# Patient Record
Sex: Female | Born: 1935 | Race: Black or African American | Hispanic: No | Smoking: Former smoker
Health system: Southern US, Community
[De-identification: ages and names within clinical notes are randomized; demographics above are authoritative.]

## PROBLEM LIST (undated history)

## (undated) DIAGNOSIS — F5104 Psychophysiologic insomnia: Secondary | ICD-10-CM

## (undated) DIAGNOSIS — R440 Auditory hallucinations: Secondary | ICD-10-CM

## (undated) DIAGNOSIS — K2270 Barrett's esophagus without dysplasia: Secondary | ICD-10-CM

## (undated) DIAGNOSIS — R911 Solitary pulmonary nodule: Secondary | ICD-10-CM

## (undated) DIAGNOSIS — K5790 Diverticulosis of intestine, part unspecified, without perforation or abscess without bleeding: Secondary | ICD-10-CM

## (undated) DIAGNOSIS — I350 Nonrheumatic aortic (valve) stenosis: Secondary | ICD-10-CM

## (undated) DIAGNOSIS — I82402 Acute embolism and thrombosis of unspecified deep veins of left lower extremity: Secondary | ICD-10-CM

## (undated) DIAGNOSIS — E785 Hyperlipidemia, unspecified: Secondary | ICD-10-CM

## (undated) DIAGNOSIS — K8020 Calculus of gallbladder without cholecystitis without obstruction: Secondary | ICD-10-CM

## (undated) DIAGNOSIS — I10 Essential (primary) hypertension: Secondary | ICD-10-CM

## (undated) DIAGNOSIS — I82409 Acute embolism and thrombosis of unspecified deep veins of unspecified lower extremity: Secondary | ICD-10-CM

## (undated) DIAGNOSIS — K3184 Gastroparesis: Secondary | ICD-10-CM

## (undated) DIAGNOSIS — G4700 Insomnia, unspecified: Secondary | ICD-10-CM

## (undated) DIAGNOSIS — Z952 Presence of prosthetic heart valve: Secondary | ICD-10-CM

## (undated) DIAGNOSIS — Z8719 Personal history of other diseases of the digestive system: Secondary | ICD-10-CM

## (undated) DIAGNOSIS — F32A Depression, unspecified: Secondary | ICD-10-CM

## (undated) DIAGNOSIS — D649 Anemia, unspecified: Secondary | ICD-10-CM

## (undated) DIAGNOSIS — F419 Anxiety disorder, unspecified: Secondary | ICD-10-CM

## (undated) DIAGNOSIS — R270 Ataxia, unspecified: Secondary | ICD-10-CM

## (undated) DIAGNOSIS — K219 Gastro-esophageal reflux disease without esophagitis: Secondary | ICD-10-CM

## (undated) DIAGNOSIS — F329 Major depressive disorder, single episode, unspecified: Secondary | ICD-10-CM

## (undated) DIAGNOSIS — K449 Diaphragmatic hernia without obstruction or gangrene: Secondary | ICD-10-CM

## (undated) HISTORY — DX: Gastroparesis: K31.84

## (undated) HISTORY — DX: Essential (primary) hypertension: I10

## (undated) HISTORY — DX: Auditory hallucinations: R44.0

## (undated) HISTORY — PX: EYE SURGERY: SHX253

## (undated) HISTORY — DX: Depression, unspecified: F32.A

## (undated) HISTORY — PX: CATARACT EXTRACTION: SUR2

## (undated) HISTORY — DX: Nonrheumatic aortic (valve) stenosis: I35.0

## (undated) HISTORY — DX: Hyperlipidemia, unspecified: E78.5

## (undated) HISTORY — PX: COLONOSCOPY: SHX174

## (undated) HISTORY — DX: Ataxia, unspecified: R27.0

## (undated) HISTORY — PX: APPENDECTOMY: SHX54

## (undated) HISTORY — DX: Psychophysiologic insomnia: F51.04

## (undated) HISTORY — PX: CARDIAC CATHETERIZATION: SHX172

## (undated) HISTORY — DX: Acute embolism and thrombosis of unspecified deep veins of unspecified lower extremity: I82.409

## (undated) HISTORY — PX: KNEE ARTHROSCOPY: SUR90

## (undated) HISTORY — DX: Calculus of gallbladder without cholecystitis without obstruction: K80.20

## (undated) HISTORY — PX: HEMORRHOID SURGERY: SHX153

## (undated) HISTORY — DX: Diaphragmatic hernia without obstruction or gangrene: K44.9

## (undated) HISTORY — DX: Gastro-esophageal reflux disease without esophagitis: K21.9

## (undated) HISTORY — PX: ABDOMINAL HYSTERECTOMY: SHX81

## (undated) HISTORY — DX: Major depressive disorder, single episode, unspecified: F32.9

## (undated) HISTORY — DX: Anxiety disorder, unspecified: F41.9

## (undated) HISTORY — DX: Diverticulosis of intestine, part unspecified, without perforation or abscess without bleeding: K57.90

---
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
## 1997-09-30 ENCOUNTER — Encounter: Admission: RE | Admit: 1997-09-30 | Discharge: 1997-09-30 | Payer: Self-pay | Admitting: Psychiatry

## 1998-04-26 HISTORY — PX: POLYPECTOMY: SHX149

## 1998-08-05 ENCOUNTER — Encounter: Admission: RE | Admit: 1998-08-05 | Discharge: 1998-08-05 | Payer: Self-pay | Admitting: Internal Medicine

## 1998-08-26 ENCOUNTER — Encounter: Admission: RE | Admit: 1998-08-26 | Discharge: 1998-08-26 | Payer: Self-pay | Admitting: Internal Medicine

## 1998-10-01 ENCOUNTER — Encounter: Admission: RE | Admit: 1998-10-01 | Discharge: 1998-10-01 | Payer: Self-pay | Admitting: Internal Medicine

## 1998-12-17 ENCOUNTER — Encounter: Admission: RE | Admit: 1998-12-17 | Discharge: 1998-12-17 | Payer: Self-pay | Admitting: Internal Medicine

## 1998-12-17 ENCOUNTER — Encounter: Payer: Self-pay | Admitting: Internal Medicine

## 1998-12-17 ENCOUNTER — Ambulatory Visit (HOSPITAL_COMMUNITY): Admission: RE | Admit: 1998-12-17 | Discharge: 1998-12-17 | Payer: Self-pay | Admitting: Internal Medicine

## 1998-12-30 ENCOUNTER — Encounter: Admission: RE | Admit: 1998-12-30 | Discharge: 1998-12-30 | Payer: Self-pay | Admitting: Internal Medicine

## 1998-12-30 ENCOUNTER — Ambulatory Visit (HOSPITAL_COMMUNITY): Admission: RE | Admit: 1998-12-30 | Discharge: 1998-12-30 | Payer: Self-pay | Admitting: Internal Medicine

## 1999-01-05 ENCOUNTER — Encounter: Admission: RE | Admit: 1999-01-05 | Discharge: 1999-01-05 | Payer: Self-pay | Admitting: Internal Medicine

## 1999-01-20 ENCOUNTER — Ambulatory Visit (HOSPITAL_COMMUNITY): Admission: RE | Admit: 1999-01-20 | Discharge: 1999-01-20 | Payer: Self-pay | Admitting: Internal Medicine

## 1999-01-20 ENCOUNTER — Encounter: Admission: RE | Admit: 1999-01-20 | Discharge: 1999-01-20 | Payer: Self-pay | Admitting: Internal Medicine

## 1999-01-22 ENCOUNTER — Ambulatory Visit (HOSPITAL_COMMUNITY): Admission: RE | Admit: 1999-01-22 | Discharge: 1999-01-22 | Payer: Self-pay

## 1999-01-22 ENCOUNTER — Encounter: Payer: Self-pay | Admitting: Internal Medicine

## 1999-02-11 ENCOUNTER — Encounter: Admission: RE | Admit: 1999-02-11 | Discharge: 1999-02-11 | Payer: Self-pay | Admitting: Internal Medicine

## 1999-03-04 ENCOUNTER — Encounter (INDEPENDENT_AMBULATORY_CARE_PROVIDER_SITE_OTHER): Payer: Self-pay | Admitting: *Deleted

## 1999-03-04 ENCOUNTER — Ambulatory Visit (HOSPITAL_COMMUNITY): Admission: RE | Admit: 1999-03-04 | Discharge: 1999-03-04 | Payer: Self-pay | Admitting: Gastroenterology

## 1999-06-10 ENCOUNTER — Encounter: Admission: RE | Admit: 1999-06-10 | Discharge: 1999-06-10 | Payer: Self-pay | Admitting: Internal Medicine

## 1999-08-06 ENCOUNTER — Encounter: Admission: RE | Admit: 1999-08-06 | Discharge: 1999-08-06 | Payer: Self-pay | Admitting: Internal Medicine

## 1999-12-15 ENCOUNTER — Emergency Department (HOSPITAL_COMMUNITY): Admission: EM | Admit: 1999-12-15 | Discharge: 1999-12-15 | Payer: Self-pay | Admitting: Emergency Medicine

## 1999-12-15 ENCOUNTER — Encounter: Payer: Self-pay | Admitting: Emergency Medicine

## 1999-12-17 ENCOUNTER — Encounter: Admission: RE | Admit: 1999-12-17 | Discharge: 1999-12-17 | Payer: Self-pay | Admitting: Internal Medicine

## 2000-02-24 ENCOUNTER — Encounter: Admission: RE | Admit: 2000-02-24 | Discharge: 2000-02-24 | Payer: Self-pay | Admitting: Internal Medicine

## 2000-03-14 ENCOUNTER — Encounter: Admission: RE | Admit: 2000-03-14 | Discharge: 2000-03-14 | Payer: Self-pay | Admitting: Internal Medicine

## 2000-05-30 ENCOUNTER — Encounter: Admission: RE | Admit: 2000-05-30 | Discharge: 2000-05-30 | Payer: Self-pay | Admitting: Internal Medicine

## 2000-06-13 ENCOUNTER — Encounter: Admission: RE | Admit: 2000-06-13 | Discharge: 2000-06-13 | Payer: Self-pay | Admitting: Internal Medicine

## 2000-08-24 ENCOUNTER — Encounter: Admission: RE | Admit: 2000-08-24 | Discharge: 2000-08-24 | Payer: Self-pay | Admitting: Hematology and Oncology

## 2000-09-27 ENCOUNTER — Encounter: Payer: Self-pay | Admitting: Emergency Medicine

## 2000-09-27 ENCOUNTER — Emergency Department (HOSPITAL_COMMUNITY): Admission: EM | Admit: 2000-09-27 | Discharge: 2000-09-27 | Payer: Self-pay | Admitting: Emergency Medicine

## 2000-10-24 HISTORY — PX: MENISCECTOMY: SHX123

## 2000-10-26 ENCOUNTER — Encounter: Admission: RE | Admit: 2000-10-26 | Discharge: 2000-10-26 | Payer: Self-pay | Admitting: Internal Medicine

## 2000-11-10 ENCOUNTER — Encounter: Admission: RE | Admit: 2000-11-10 | Discharge: 2000-11-10 | Payer: Self-pay | Admitting: Internal Medicine

## 2000-11-18 ENCOUNTER — Emergency Department (HOSPITAL_COMMUNITY): Admission: EM | Admit: 2000-11-18 | Discharge: 2000-11-18 | Payer: Self-pay | Admitting: Emergency Medicine

## 2000-11-21 ENCOUNTER — Encounter: Payer: Self-pay | Admitting: Orthopedic Surgery

## 2000-11-21 ENCOUNTER — Encounter: Admission: RE | Admit: 2000-11-21 | Discharge: 2000-11-21 | Payer: Self-pay | Admitting: Orthopedic Surgery

## 2000-11-23 ENCOUNTER — Ambulatory Visit (HOSPITAL_BASED_OUTPATIENT_CLINIC_OR_DEPARTMENT_OTHER): Admission: RE | Admit: 2000-11-23 | Discharge: 2000-11-24 | Payer: Self-pay | Admitting: Orthopedic Surgery

## 2000-11-25 ENCOUNTER — Emergency Department (HOSPITAL_COMMUNITY): Admission: EM | Admit: 2000-11-25 | Discharge: 2000-11-25 | Payer: Self-pay | Admitting: Emergency Medicine

## 2001-07-17 ENCOUNTER — Encounter: Admission: RE | Admit: 2001-07-17 | Discharge: 2001-07-17 | Payer: Self-pay | Admitting: Internal Medicine

## 2001-10-15 ENCOUNTER — Emergency Department (HOSPITAL_COMMUNITY): Admission: EM | Admit: 2001-10-15 | Discharge: 2001-10-15 | Payer: Self-pay | Admitting: Emergency Medicine

## 2001-11-09 ENCOUNTER — Encounter: Admission: RE | Admit: 2001-11-09 | Discharge: 2001-11-09 | Payer: Self-pay | Admitting: Internal Medicine

## 2002-05-29 ENCOUNTER — Encounter: Admission: RE | Admit: 2002-05-29 | Discharge: 2002-05-29 | Payer: Self-pay | Admitting: Internal Medicine

## 2002-06-05 ENCOUNTER — Encounter: Admission: RE | Admit: 2002-06-05 | Discharge: 2002-06-05 | Payer: Self-pay | Admitting: Internal Medicine

## 2002-08-13 ENCOUNTER — Encounter: Admission: RE | Admit: 2002-08-13 | Discharge: 2002-08-13 | Payer: Self-pay | Admitting: Internal Medicine

## 2002-08-16 ENCOUNTER — Ambulatory Visit (HOSPITAL_COMMUNITY): Admission: RE | Admit: 2002-08-16 | Discharge: 2002-08-16 | Payer: Self-pay | Admitting: Internal Medicine

## 2002-08-17 ENCOUNTER — Encounter: Admission: RE | Admit: 2002-08-17 | Discharge: 2002-08-17 | Payer: Self-pay | Admitting: Internal Medicine

## 2002-08-17 ENCOUNTER — Ambulatory Visit (HOSPITAL_COMMUNITY): Admission: RE | Admit: 2002-08-17 | Discharge: 2002-08-17 | Payer: Self-pay | Admitting: Internal Medicine

## 2002-11-21 ENCOUNTER — Encounter: Admission: RE | Admit: 2002-11-21 | Discharge: 2002-11-21 | Payer: Self-pay | Admitting: Internal Medicine

## 2003-08-19 ENCOUNTER — Ambulatory Visit (HOSPITAL_COMMUNITY): Admission: RE | Admit: 2003-08-19 | Discharge: 2003-08-19 | Payer: Self-pay | Admitting: Internal Medicine

## 2003-09-09 ENCOUNTER — Ambulatory Visit (HOSPITAL_COMMUNITY): Admission: RE | Admit: 2003-09-09 | Discharge: 2003-09-09 | Payer: Self-pay | Admitting: Gastroenterology

## 2003-09-30 ENCOUNTER — Encounter: Admission: RE | Admit: 2003-09-30 | Discharge: 2003-09-30 | Payer: Self-pay | Admitting: Internal Medicine

## 2004-02-21 ENCOUNTER — Emergency Department (HOSPITAL_COMMUNITY): Admission: EM | Admit: 2004-02-21 | Discharge: 2004-02-21 | Payer: Self-pay | Admitting: Emergency Medicine

## 2004-02-23 ENCOUNTER — Emergency Department (HOSPITAL_COMMUNITY): Admission: EM | Admit: 2004-02-23 | Discharge: 2004-02-23 | Payer: Self-pay | Admitting: *Deleted

## 2004-02-25 ENCOUNTER — Emergency Department (HOSPITAL_COMMUNITY): Admission: EM | Admit: 2004-02-25 | Discharge: 2004-02-26 | Payer: Self-pay | Admitting: Emergency Medicine

## 2004-04-27 ENCOUNTER — Emergency Department (HOSPITAL_COMMUNITY): Admission: EM | Admit: 2004-04-27 | Discharge: 2004-04-27 | Payer: Self-pay | Admitting: Emergency Medicine

## 2004-04-30 ENCOUNTER — Emergency Department (HOSPITAL_COMMUNITY): Admission: EM | Admit: 2004-04-30 | Discharge: 2004-04-30 | Payer: Self-pay | Admitting: Family Medicine

## 2004-05-07 ENCOUNTER — Ambulatory Visit: Payer: Self-pay | Admitting: Internal Medicine

## 2004-06-17 ENCOUNTER — Ambulatory Visit: Payer: Self-pay | Admitting: Internal Medicine

## 2004-08-31 ENCOUNTER — Ambulatory Visit: Payer: Self-pay | Admitting: Internal Medicine

## 2004-09-09 ENCOUNTER — Ambulatory Visit (HOSPITAL_COMMUNITY): Admission: RE | Admit: 2004-09-09 | Discharge: 2004-09-09 | Payer: Self-pay | Admitting: Internal Medicine

## 2004-09-09 ENCOUNTER — Encounter (INDEPENDENT_AMBULATORY_CARE_PROVIDER_SITE_OTHER): Payer: Self-pay | Admitting: Internal Medicine

## 2005-10-08 ENCOUNTER — Encounter (INDEPENDENT_AMBULATORY_CARE_PROVIDER_SITE_OTHER): Payer: Self-pay | Admitting: *Deleted

## 2005-10-08 ENCOUNTER — Emergency Department (HOSPITAL_COMMUNITY): Admission: EM | Admit: 2005-10-08 | Discharge: 2005-10-08 | Payer: Self-pay | Admitting: Family Medicine

## 2005-10-08 ENCOUNTER — Ambulatory Visit (HOSPITAL_COMMUNITY): Admission: RE | Admit: 2005-10-08 | Discharge: 2005-10-08 | Payer: Self-pay | Admitting: Family Medicine

## 2006-06-29 ENCOUNTER — Telehealth (INDEPENDENT_AMBULATORY_CARE_PROVIDER_SITE_OTHER): Payer: Self-pay | Admitting: Internal Medicine

## 2006-07-06 ENCOUNTER — Ambulatory Visit: Payer: Self-pay | Admitting: Internal Medicine

## 2006-07-06 DIAGNOSIS — I872 Venous insufficiency (chronic) (peripheral): Secondary | ICD-10-CM | POA: Insufficient documentation

## 2006-07-11 ENCOUNTER — Telehealth (INDEPENDENT_AMBULATORY_CARE_PROVIDER_SITE_OTHER): Payer: Self-pay | Admitting: Internal Medicine

## 2006-07-12 ENCOUNTER — Telehealth: Payer: Self-pay | Admitting: *Deleted

## 2006-07-27 ENCOUNTER — Ambulatory Visit: Payer: Self-pay | Admitting: Internal Medicine

## 2006-07-27 DIAGNOSIS — G4700 Insomnia, unspecified: Secondary | ICD-10-CM

## 2006-07-27 HISTORY — DX: Insomnia, unspecified: G47.00

## 2006-11-07 ENCOUNTER — Telehealth (INDEPENDENT_AMBULATORY_CARE_PROVIDER_SITE_OTHER): Payer: Self-pay | Admitting: Internal Medicine

## 2006-11-30 ENCOUNTER — Ambulatory Visit: Payer: Self-pay | Admitting: Internal Medicine

## 2006-12-05 ENCOUNTER — Ambulatory Visit (HOSPITAL_COMMUNITY): Admission: RE | Admit: 2006-12-05 | Discharge: 2006-12-05 | Payer: Self-pay | Admitting: Family Medicine

## 2006-12-10 ENCOUNTER — Ambulatory Visit: Payer: Self-pay | Admitting: Hospitalist

## 2006-12-10 ENCOUNTER — Inpatient Hospital Stay (HOSPITAL_COMMUNITY): Admission: EM | Admit: 2006-12-10 | Discharge: 2006-12-21 | Payer: Self-pay | Admitting: Emergency Medicine

## 2006-12-13 ENCOUNTER — Encounter (INDEPENDENT_AMBULATORY_CARE_PROVIDER_SITE_OTHER): Payer: Self-pay | Admitting: Hospitalist

## 2006-12-26 HISTORY — PX: DIRECT LARYNGOSCOPY: SHX5326

## 2006-12-28 ENCOUNTER — Telehealth: Payer: Self-pay | Admitting: *Deleted

## 2006-12-29 ENCOUNTER — Encounter (INDEPENDENT_AMBULATORY_CARE_PROVIDER_SITE_OTHER): Payer: Self-pay | Admitting: Otolaryngology

## 2006-12-29 ENCOUNTER — Ambulatory Visit (HOSPITAL_BASED_OUTPATIENT_CLINIC_OR_DEPARTMENT_OTHER): Admission: RE | Admit: 2006-12-29 | Discharge: 2006-12-29 | Payer: Self-pay | Admitting: Otolaryngology

## 2007-01-04 ENCOUNTER — Ambulatory Visit: Payer: Self-pay | Admitting: Internal Medicine

## 2007-01-04 ENCOUNTER — Encounter (INDEPENDENT_AMBULATORY_CARE_PROVIDER_SITE_OTHER): Payer: Self-pay | Admitting: *Deleted

## 2007-01-04 DIAGNOSIS — D649 Anemia, unspecified: Secondary | ICD-10-CM

## 2007-01-04 LAB — CONVERTED CEMR LAB
Basophils Absolute: 0 10*3/uL (ref 0.0–0.1)
Hemoglobin: 12.8 g/dL (ref 12.0–15.0)
Lymphocytes Relative: 35 % (ref 12–46)
Lymphs Abs: 1.6 10*3/uL (ref 0.7–3.3)
Monocytes Absolute: 0.4 10*3/uL (ref 0.2–0.7)
Monocytes Relative: 8 % (ref 3–11)
Neutro Abs: 2.4 10*3/uL (ref 1.7–7.7)
RBC: 4.49 M/uL (ref 3.87–5.11)
RDW: 14.2 % — ABNORMAL HIGH (ref 11.5–14.0)
WBC: 4.5 10*3/uL (ref 4.0–10.5)

## 2007-01-06 ENCOUNTER — Encounter (INDEPENDENT_AMBULATORY_CARE_PROVIDER_SITE_OTHER): Payer: Self-pay | Admitting: Internal Medicine

## 2007-01-25 ENCOUNTER — Ambulatory Visit: Payer: Self-pay | Admitting: Internal Medicine

## 2007-02-25 ENCOUNTER — Telehealth (INDEPENDENT_AMBULATORY_CARE_PROVIDER_SITE_OTHER): Payer: Self-pay | Admitting: *Deleted

## 2007-03-08 ENCOUNTER — Telehealth (INDEPENDENT_AMBULATORY_CARE_PROVIDER_SITE_OTHER): Payer: Self-pay | Admitting: Internal Medicine

## 2007-06-12 ENCOUNTER — Telehealth: Payer: Self-pay | Admitting: *Deleted

## 2007-06-20 ENCOUNTER — Telehealth: Payer: Self-pay | Admitting: *Deleted

## 2007-07-06 ENCOUNTER — Ambulatory Visit: Payer: Self-pay | Admitting: Internal Medicine

## 2007-08-25 ENCOUNTER — Telehealth: Payer: Self-pay | Admitting: *Deleted

## 2007-11-09 ENCOUNTER — Emergency Department (HOSPITAL_COMMUNITY): Admission: EM | Admit: 2007-11-09 | Discharge: 2007-11-09 | Payer: Self-pay | Admitting: Emergency Medicine

## 2008-01-18 ENCOUNTER — Encounter (INDEPENDENT_AMBULATORY_CARE_PROVIDER_SITE_OTHER): Payer: Self-pay | Admitting: Internal Medicine

## 2008-03-06 ENCOUNTER — Telehealth (INDEPENDENT_AMBULATORY_CARE_PROVIDER_SITE_OTHER): Payer: Self-pay | Admitting: Internal Medicine

## 2008-03-12 ENCOUNTER — Ambulatory Visit: Payer: Self-pay | Admitting: Internal Medicine

## 2008-03-12 DIAGNOSIS — L723 Sebaceous cyst: Secondary | ICD-10-CM

## 2008-03-13 ENCOUNTER — Ambulatory Visit: Payer: Self-pay | Admitting: *Deleted

## 2008-03-13 ENCOUNTER — Encounter (INDEPENDENT_AMBULATORY_CARE_PROVIDER_SITE_OTHER): Payer: Self-pay | Admitting: Internal Medicine

## 2008-03-14 LAB — CONVERTED CEMR LAB
CO2: 24 meq/L (ref 19–32)
Chloride: 104 meq/L (ref 96–112)
Creatinine, Ser: 0.77 mg/dL (ref 0.40–1.20)
Glucose, Bld: 70 mg/dL (ref 70–99)

## 2008-03-20 ENCOUNTER — Telehealth: Payer: Self-pay | Admitting: *Deleted

## 2008-05-14 ENCOUNTER — Ambulatory Visit (HOSPITAL_COMMUNITY): Admission: RE | Admit: 2008-05-14 | Discharge: 2008-05-14 | Payer: Self-pay | Admitting: Family Medicine

## 2008-05-14 ENCOUNTER — Encounter (INDEPENDENT_AMBULATORY_CARE_PROVIDER_SITE_OTHER): Payer: Self-pay | Admitting: Internal Medicine

## 2008-06-26 ENCOUNTER — Telehealth (INDEPENDENT_AMBULATORY_CARE_PROVIDER_SITE_OTHER): Payer: Self-pay | Admitting: Internal Medicine

## 2008-08-20 ENCOUNTER — Telehealth (INDEPENDENT_AMBULATORY_CARE_PROVIDER_SITE_OTHER): Payer: Self-pay | Admitting: Internal Medicine

## 2008-09-12 ENCOUNTER — Telehealth (INDEPENDENT_AMBULATORY_CARE_PROVIDER_SITE_OTHER): Payer: Self-pay | Admitting: Internal Medicine

## 2008-10-16 ENCOUNTER — Telehealth (INDEPENDENT_AMBULATORY_CARE_PROVIDER_SITE_OTHER): Payer: Self-pay | Admitting: Internal Medicine

## 2008-10-23 ENCOUNTER — Encounter: Payer: Self-pay | Admitting: *Deleted

## 2008-11-15 ENCOUNTER — Ambulatory Visit: Payer: Self-pay | Admitting: Infectious Diseases

## 2008-11-15 ENCOUNTER — Encounter (INDEPENDENT_AMBULATORY_CARE_PROVIDER_SITE_OTHER): Payer: Self-pay | Admitting: Internal Medicine

## 2008-11-16 LAB — CONVERTED CEMR LAB
BUN: 12 mg/dL (ref 6–23)
Chloride: 105 meq/L (ref 96–112)
Glucose, Bld: 88 mg/dL (ref 70–99)
Potassium: 4.1 meq/L (ref 3.5–5.3)

## 2008-12-05 ENCOUNTER — Ambulatory Visit: Payer: Self-pay | Admitting: Internal Medicine

## 2008-12-10 ENCOUNTER — Telehealth (INDEPENDENT_AMBULATORY_CARE_PROVIDER_SITE_OTHER): Payer: Self-pay | Admitting: Internal Medicine

## 2008-12-13 ENCOUNTER — Ambulatory Visit (HOSPITAL_COMMUNITY): Admission: RE | Admit: 2008-12-13 | Discharge: 2008-12-13 | Payer: Self-pay | Admitting: Internal Medicine

## 2008-12-19 ENCOUNTER — Encounter: Admission: RE | Admit: 2008-12-19 | Discharge: 2008-12-19 | Payer: Self-pay | Admitting: Internal Medicine

## 2009-04-28 ENCOUNTER — Telehealth: Payer: Self-pay | Admitting: Internal Medicine

## 2009-05-07 ENCOUNTER — Telehealth: Payer: Self-pay | Admitting: *Deleted

## 2009-05-27 ENCOUNTER — Ambulatory Visit: Payer: Self-pay | Admitting: Internal Medicine

## 2009-05-28 ENCOUNTER — Telehealth: Payer: Self-pay | Admitting: Internal Medicine

## 2009-06-05 ENCOUNTER — Telehealth: Payer: Self-pay | Admitting: Internal Medicine

## 2009-06-24 ENCOUNTER — Telehealth: Payer: Self-pay | Admitting: Internal Medicine

## 2009-06-24 ENCOUNTER — Encounter: Payer: Self-pay | Admitting: Internal Medicine

## 2009-07-22 ENCOUNTER — Telehealth: Payer: Self-pay | Admitting: Internal Medicine

## 2009-08-27 ENCOUNTER — Telehealth: Payer: Self-pay | Admitting: Internal Medicine

## 2009-09-05 ENCOUNTER — Telehealth: Payer: Self-pay | Admitting: Internal Medicine

## 2009-10-13 ENCOUNTER — Telehealth: Payer: Self-pay | Admitting: Internal Medicine

## 2009-12-16 ENCOUNTER — Encounter: Payer: Self-pay | Admitting: Internal Medicine

## 2009-12-16 ENCOUNTER — Telehealth: Payer: Self-pay | Admitting: Internal Medicine

## 2010-01-13 ENCOUNTER — Telehealth: Payer: Self-pay | Admitting: Internal Medicine

## 2010-01-14 ENCOUNTER — Encounter: Payer: Self-pay | Admitting: Internal Medicine

## 2010-01-24 DIAGNOSIS — E785 Hyperlipidemia, unspecified: Secondary | ICD-10-CM

## 2010-01-24 HISTORY — DX: Hyperlipidemia, unspecified: E78.5

## 2010-02-03 ENCOUNTER — Ambulatory Visit: Payer: Self-pay | Admitting: Internal Medicine

## 2010-02-03 DIAGNOSIS — G894 Chronic pain syndrome: Secondary | ICD-10-CM | POA: Insufficient documentation

## 2010-02-03 DIAGNOSIS — R209 Unspecified disturbances of skin sensation: Secondary | ICD-10-CM | POA: Insufficient documentation

## 2010-02-03 DIAGNOSIS — G608 Other hereditary and idiopathic neuropathies: Secondary | ICD-10-CM | POA: Insufficient documentation

## 2010-02-03 LAB — CONVERTED CEMR LAB: Vitamin B-12: 413 pg/mL (ref 211–911)

## 2010-02-04 LAB — CONVERTED CEMR LAB
BUN: 20 mg/dL (ref 6–23)
CO2: 26 meq/L (ref 19–32)
Calcium: 9.4 mg/dL (ref 8.4–10.5)
Chloride: 104 meq/L (ref 96–112)
Creatinine, Ser: 0.82 mg/dL (ref 0.40–1.20)
Glucose, Bld: 90 mg/dL (ref 70–99)
TSH: 0.629 microintl units/mL (ref 0.350–4.5)

## 2010-02-09 ENCOUNTER — Telehealth: Payer: Self-pay | Admitting: Internal Medicine

## 2010-03-09 ENCOUNTER — Telehealth (INDEPENDENT_AMBULATORY_CARE_PROVIDER_SITE_OTHER): Payer: Self-pay | Admitting: *Deleted

## 2010-03-10 ENCOUNTER — Ambulatory Visit: Payer: Self-pay | Admitting: Internal Medicine

## 2010-03-13 ENCOUNTER — Ambulatory Visit: Payer: Self-pay | Admitting: Internal Medicine

## 2010-03-13 ENCOUNTER — Encounter: Payer: Self-pay | Admitting: Licensed Clinical Social Worker

## 2010-04-01 ENCOUNTER — Telehealth: Payer: Self-pay | Admitting: Internal Medicine

## 2010-04-22 ENCOUNTER — Telehealth (INDEPENDENT_AMBULATORY_CARE_PROVIDER_SITE_OTHER): Payer: Self-pay | Admitting: *Deleted

## 2010-05-12 ENCOUNTER — Telehealth: Payer: Self-pay | Admitting: Internal Medicine

## 2010-05-13 ENCOUNTER — Telehealth: Payer: Self-pay | Admitting: *Deleted

## 2010-05-15 ENCOUNTER — Telehealth: Payer: Self-pay | Admitting: Internal Medicine

## 2010-05-18 ENCOUNTER — Encounter: Payer: Self-pay | Admitting: Internal Medicine

## 2010-05-25 ENCOUNTER — Ambulatory Visit: Admission: RE | Admit: 2010-05-25 | Discharge: 2010-05-25 | Payer: Self-pay | Source: Home / Self Care

## 2010-05-25 ENCOUNTER — Encounter: Payer: Self-pay | Admitting: Licensed Clinical Social Worker

## 2010-05-27 ENCOUNTER — Ambulatory Visit (HOSPITAL_COMMUNITY): Payer: Self-pay | Admitting: Psychiatry

## 2010-05-28 ENCOUNTER — Telehealth: Payer: Self-pay | Admitting: Licensed Clinical Social Worker

## 2010-05-28 NOTE — Progress Notes (Signed)
Summary: PREVENTIVE COLONOSCOPY  Phone Note Outgoing Call   Summary of Call: Found actual copy of the last colonoscopy located in patient's paperchart.  Last Colonoscopy on 02/13/1999 by the [facility] performed by Dr. Vida Rigger. Initial call taken by: Shon Hough,  March 09, 2010 4:07 PM

## 2010-05-28 NOTE — Progress Notes (Signed)
Summary: refill/gg  Phone Note Refill Request  on June 24, 2009 1:52 PM  Refills Requested: Medication #1:  HYDROCODONE-ACETAMINOPHEN 7.5-750 MG TABS 1 up to four times a day as needed back pain   Last Refilled: 05/27/2009  Method Requested: Telephone to Pharmacy Initial call taken by: Merrie Roof RN,  June 24, 2009 1:53 PM  Follow-up for Phone Call        IS time for refill.  Ran name through [place] narc database Follow-up by: Blanch Media MD,  June 24, 2009 3:49 PM    Prescriptions: HYDROCODONE-ACETAMINOPHEN 7.5-750 MG TABS (HYDROCODONE-ACETAMINOPHEN) 1 up to four times a day as needed back pain  #120 x 0   Entered and Authorized by:   Blanch Media MD   Signed by:   Blanch Media MD on 06/24/2009   Method used:   Telephoned to ...       [facility] [address] [facility] [address]ail[facility]       [address] [place], [place]  [address]       Ph: [phone]       Fax: [phone]   RxID:   [id number]   Appended Document: [facility]

## 2010-05-28 NOTE — Assessment & Plan Note (Signed)
Summary: ACUTE/BUTCHER/F/U VISIT/CH   Vital Signs:  Patient profile:   77 year old female Height:      60 inches (152.40 cm) Weight:      119.7 pounds (54.41 kg) BMI:     23.46 Temp:     97.4 degrees F (36.33 degrees C) oral Pulse rate:   59 / minute BP sitting:   135 / 74  (left arm)  Vitals Entered By: Stanton Kidney Ditzler RN (March 13, 2010 10:44 AM) Is Patient Diabetic? No Pain Assessment Patient in pain? no      Nutritional Status BMI of 19 -24 = normal Nutritional Status Detail appetite fair  Have you ever been in a relationship where you felt threatened, hurt or afraid?denies   Does patient need assistance? Functional Status Self care Ambulation Normal Comments FU - sl better. Past 4 months pain right shoulder - worse at night.   Primary Care Provider:  Blanch Media MD   History of Present Illness: Pt was here to see Dr Dorthula Rue but came about 3 hours early. Waited. I went to see if she could talk with Lupita Leash T while she waited and was willing but stated she could not wait to see Dr Dorthula Rue. I did a quick assessment to see how she responded to the two new meds, assess for side effects, and if she was OK to omit Dr Dorthula Rue appt and I felt comfortable with her only seeing Lupita Leash T today. I will refill meds as I explain in the A/P.  Depression History:      The patient denies a depressed mood most of the day and a diminished interest in her usual daily activities.         Preventive Screening-Counseling & Management  Alcohol-Tobacco     Smoking Status: current     Smoking Cessation Counseling: no     Packs/Day: 1 pack every 3 weeks     Year Started: many years  Caffeine-Diet-Exercise     Does Patient Exercise: yes     Type of exercise: WALKING     Exercise (avg: min/session): 30-60     Times/week: 5  Allergies: 1)  ! Asa 2)  ! * Iron Tab 3)  ! Darvocet  Review of Systems General:  Denies sleep disorder. MS:  Complains of joint pain, muscle aches, muscle,  and cramps. Psych:  Complains of depression; denies easily tearful and irritability; But all sxs are greatly improved..   Impression & Recommendations:  Problem # 1:  GRIEF REACTION, ACUTE (ICD-309.0) She states she is doing better - mood better, depression better, thinks will be able to sing tonite. Had no rxn to Celexa. She is talking to Lupita Leash T now.   Problem # 2:  INSOMNIA, PERSISTENT (ICD-307.42) PT had great response to the Ambien. She took it at 9PM and was able to sleep until 4AM. She dozed in bed until later. No reaction to the med. Would like to cont it. I see no reason not to at least in the short term.    Problem # 3:  CHRONIC PAIN SYNDROME (ICD-338.4) Was c/o R shoulder pain today - chronic with no change. States the hydrocodone did help with the pain - it just didn't with the insomnia. States that on a bad day would need two and on a good day need one. So will Rx 40 and see it that will help with the pain and last one month. Sheh does not have a narc contract bc not chronic med yet  but likely will become. She has tried Advil and IBU without relief.  Complete Medication List: 1)  Lasix 40 Mg Tabs (Furosemide) .... Take 1 tablet by mouth once a day 2)  Amlodipine Besylate 10 Mg Tabs (Amlodipine besylate) .... Take 1 tablet by mouth once a day 3)  Promethazine Hcl 12.5 Mg Tabs (Promethazine hcl) .... One pill every 6 hours as neded for nausea 4)  Alprazolam 0.5 Mg Tabs (Alprazolam) .... Take one at bedtime.  one by mouth once daily if needed for anxiety 5)  Hydroxyzine Pamoate 25 Mg Caps (Hydroxyzine pamoate) .... Take one pill two times a day as needed for your nerves 6)  Citalopram Hydrobromide 20 Mg Tabs (Citalopram hydrobromide) .... Take one pill once daily 7)  Zolpidem Tartrate 10 Mg Tabs (Zolpidem tartrate) .... Take one pill at bedtime for sleep 8)  Hydrocodone-acetaminophen 5-325 Mg Tabs (Hydrocodone-acetaminophen) .... Take one pill once or twice a day as needed for  pain. Prescriptions: HYDROCODONE-ACETAMINOPHEN 5-325 MG TABS (HYDROCODONE-ACETAMINOPHEN) Take one pill once or twice a day as needed for pain.  #40 x 0   Entered and Authorized by:   Blanch Media MD   Signed by:   Blanch Media MD on 03/13/2010   Method used:   Telephoned to ...         RxID:   [id number] ZOLPIDEM TARTRATE 10 MG TABS (ZOLPIDEM TARTRATE) Take one pill at bedtime for sleep  #30 x 1   Entered and Authorized by:   Blanch Media MD   Signed by:   Blanch Media MD on 03/13/2010   Method used:   Telephoned to ...         RxID:   [id number]    Orders Added: 1)  Est. Patient Level II [84696]     Prevention & Chronic Care Immunizations   Influenza vaccine: Not documented   Influenza vaccine deferral: Deferred  (02/03/2010)    Tetanus booster: Not documented   Td booster deferral: Deferred  (05/27/2009)    Pneumococcal vaccine: Not documented   Pneumococcal vaccine deferral: Deferred  (05/27/2009)    H. zoster vaccine: Not documented   H. zoster vaccine deferral: Deferred  (05/27/2009)  Colorectal Screening   Hemoccult: Not documented   Hemoccult action/deferral: Not indicated  (05/27/2009)    Colonoscopy: adenomatous polyp  (03/04/1999)   Colonoscopy action/deferral: Deferred  (05/27/2009)  Other Screening   Pap smear: Not documented   Pap smear action/deferral: Deferred  (05/27/2009)    Mammogram: BI-RADS CATEGORY 1:  Negative.^MM [facility]NO[facility]  (12/19/2008)   Mammogram action/deferral: Screening mammogram in 1 year.     (12/02/2006)    DXA bone density scan: Not documented   DXA bone density action/deferral: Deferred  (05/27/2009)   Smoking status: current  (03/13/2010)   Smoking cessation counseling: no  (03/13/2010)  Lipids   Total Cholesterol: 248  (02/03/2010)   LDL: 155  (02/03/2010)   LDL Direct: Not documented   HDL: 85  (02/03/2010)   Triglycerides: 41  (02/03/2010)    SGOT (AST): Not  documented   SGPT (ALT): Not documented   Alkaline phosphatase: Not documented   Total bilirubin: Not documented  Hypertension   Last Blood Pressure: 135 / 74  (03/13/2010)   Serum creatinine: 0.82  (02/03/2010)   Serum potassium 3.8  (02/03/2010)  Self-Management Support :   Personal Goals (by the next clinic visit) :      Personal blood pressure goal: 140/90  (02/03/2010)   Patient will work  on the following items until the next clinic visit to reach self-care goals:     Medications and monitoring: take my medicines every day, bring all of my medications to every visit  (03/13/2010)     Eating: drink diet soda or water instead of juice or soda, eat more vegetables, use fresh or frozen vegetables, eat foods that are low in salt, eat fruit for snacks and desserts, limit or avoid alcohol  (03/13/2010)     Activity: take a 30 minute walk every day  (03/13/2010)    Hypertension self-management support: Written self-care plan, Education handout, Resources for patients handout  (03/13/2010)   Hypertension self-care plan printed.   Hypertension education handout printed    Lipid self-management support: Written self-care plan, Education handout, Resources for patients handout  (03/13/2010)   Lipid self-care plan printed.   Lipid education handout printed      Resource handout printed.

## 2010-05-28 NOTE — Telephone Encounter (Deleted)
Called patient for follow-up.  Advised her I would be making referral to [facility] for mail delivery of medications and they may also be able to help with copays.   Patient also reported that she had rescheduled her [facility] visit to March 7 which was a more convenient time for her.  Patient also wanted confirmation of two future appointments in February and March here in [facility] which I gave to her and she wrote down.  SW will follow-up as needed.

## 2010-05-28 NOTE — Progress Notes (Signed)
----   Converted from flag ---- ---- 05/07/2009 3:26 PM, Chinita Pester RN wrote:   ---- 05/05/2009 11:15 AM, Shon Hough wrote: Left patient a message to call the clinic and make an appointment to see a her Dr.  ---- 04/30/2009 1:38 PM, Chinita Pester RN wrote: Ms. Bertz needs an appt. to continue med refills per Dr. Rogelia Boga.  Thanks ------------------------------

## 2010-05-28 NOTE — Progress Notes (Signed)
Summary: refill/gg     Phone Note Refill Request  on February 09, 2010 11:54 AM  Refills Requested: Medication #1:  HYDROCODONE-ACETAMINOPHEN 7.5-750 MG TABS 1 by mouth up to three times s a day as needed back pain   Last Refilled: 01/15/2010 # 120   Method Requested: Fax to Local Pharmacy Initial call taken by: Merrie Roof RN,  February 09, 2010 11:54 AM  Follow-up for Phone Call        I called the new number (also entered it into EMR) and got machine. Left message.   Problem is that she got the Morphine Sulfate on 10/13. So she was not to take both the hydrocodone and the Morphine Sulfate. If she calls in, please ask her which one she is using 1. the hydrocodone / gabapentin combo 2. Or the Morphine Sulfate  Thanks Follow-up by: Blanch Media MD,  February 12, 2010 11:16 AM  Additional Follow-up for Phone Call Additional follow up Details #1::        Tried to call pt, no answer - message left Merrie Roof RN  February 12, 2010 4:01 PM   Tried to call pt, no answer - message left Merrie Roof RN  February 13, 2010 11:46 AM     Additional Follow-up for Phone Call Additional follow up Details #2::    That is all we can do at present bc I won't prescribe without more info. THanks Follow-up by: Blanch Media MD,  February 13, 2010 1:57 PM  Additional Follow-up for Phone Call Additional follow up Details #3:: Details for Additional Follow-up Action Taken: Pt wants to take hydrocodone and neurontin.  She has only taked morphine once and I told her not to use if anymore.   She voices understanding. Please refill the vicodin, she is out. Merrie Roof RN  February 13, 2010 2:41 PM   Will do  Rx called in [place] RN  February 13, 2010 3:24 PM   Prescriptions: HYDROCODONE-ACETAMINOPHEN 7.5-750 MG TABS (HYDROCODONE-ACETAMINOPHEN) 1 by mouth up to three times s a day as needed back pain  #120 x 1   Entered and Authorized by:   Blanch Media MD  Signed by:   Blanch Media MD on 02/13/2010   Method used:   Telephoned to ...       [facility] [address] [facility] [address] ([facility]ail)       [address] [place], [place]  [address]       Ph: [phone]       Fax: [phone]   RxID:   [id number]

## 2010-05-28 NOTE — Progress Notes (Signed)
Summary: REfill/gh  Phone Note Refill Request Message from:  Fax from Pharmacy on April 28, 2009 4:18 PM  Refills Requested: Medication #1:  HYDROCODONE-ACETAMINOPHEN 7.5-750 MG TABS 1 up to four times a day as needed back pain   Last Refilled: 03/26/2009  Method Requested: Electronic Initial call taken by: Angelina Ok RN,  April 28, 2009 4:18 PM  Follow-up for Phone Call        Last seen 8/10 and strength of med was increased.  Was supposed to F/U 8 weeks.  I will refill one months worth (no refills) but pt needs appt to receive additional med.   Additional Follow-up for Phone Call Additional follow up Details #1::        Rx faxed to pharmacy.  Flag to [facility] to schedule pt with an appointment. Additional Follow-up by: Angelina Ok RN,  April 29, 2009 10:00 AM    Additional Follow-up for Phone Call Additional follow up Details #2::    Agree Follow-up by: Blanch Media MD,  May 01, 2009 10:54 AM  Prescriptions: HYDROCODONE-ACETAMINOPHEN 7.5-750 MG TABS (HYDROCODONE-ACETAMINOPHEN) 1 up to four times a day as needed back pain  #120 x 0   Entered and Authorized by:   Blanch Media MD   Signed by:   Blanch Media MD on 05/01/2009   Method used:   Handwritten   RxID:   [id number]

## 2010-05-28 NOTE — Medication Information (Signed)
Summary: HYDROCODONE/  HYDROCODONE/   Imported By: Margie Billet 01/07/2010 15:48:46  _____________________________________________________________________  External Attachment:    Type:   Image     Comment:   External Document

## 2010-05-28 NOTE — Medication Information (Signed)
Summary: RX HISTORY REPORT  RX HISTORY REPORT   Imported By: Margie Billet 01/27/2010 15:05:33  _____________________________________________________________________  External Attachment:    Type:   Image     Comment:   External Document  Appended Document: RX HISTORY REPORT    Clinical Lists Changes  Observations: Added new observation of COLONOSCOPY: adenomatous polyp (03/04/1999 15:28)

## 2010-05-28 NOTE — Telephone Encounter (Signed)
Called patient to let her know that I was making referral to [facility] for mail order of her medications as we had discussed at her recent office visit. She was still receptive to that option. She also reported that she had rescheduled [facility] appointment to March 7 as that was a more convenient date for her.  She also needed confirmation of her [facility] appointments in February and March which I gave to her and she wrote down.  SW follow-up as needed.

## 2010-05-28 NOTE — Progress Notes (Signed)
Summary: appt/ hla  Phone Note Call from Patient   Summary of Call: pt calls to say she is feeling much better since taking the pain med and to schedule her appt in 1-2 wks w/ dr Midwife, would you like for this to be an overbook? if so, at first of your next clinic or the end of the clinic? please forward this note to [facility] for appt. thanks, h. Initial call taken by: Marin Roberts RN,  May 15, 2010 10:21 AM  Follow-up for Phone Call        Pls sch an appt with me outside of my scheduled continuity clinic in 1-2 weeks any day except Tues AM or the 30th or afternoon of 25th and 26th. Or you can offer her an apt on my first open clinic which is the 21st I think. Follow-up by: Blanch Media MD,  May 15, 2010 10:28 AM
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
## 2010-05-28 NOTE — Progress Notes (Signed)
  Phone Note Refill Request Message from:  Pharmacy   Follow-up for Phone Call        At pts last visit, I clicked to refill pts hydralazine rather than her hydroxizine that she needed refilled.  Pt and pharmacy picked up on the error and notified Myriam Jacobson.  Pt to continue Hydralazine for her BP and I will send in refill for hydroxyzine for her nerves.  [facility] Jacobson [facility] [address]  Was that correct? Follow-up by: Blanch Media MD,  May 28, 2009 2:27 PM    New/Updated Medications: HYDROXYZINE HCL 25 MG TABS (HYDROXYZINE HCL) Take one pill by mouth two times a day for your nerves as needed. Prescriptions: HYDROXYZINE HCL 25 MG TABS (HYDROXYZINE HCL) Take one pill by mouth two times a day for your nerves as needed.  #60 x 11   Entered and Authorized by:   Blanch Media MD   Signed by:   Blanch Media MD on 05/28/2009   Method used:   [facility]ronically to        [facility] [address] [facility] [phone]* (retail)       [address] [place], [place]  [address]       Ph: [phone]       Fax: [phone]   RxID:   [id number]

## 2010-05-28 NOTE — Medication Information (Signed)
Summary: [employer] [place] [employer]  [place]   Imported By: Florinda Marker 07/02/2009 10:04:49  _____________________________________________________________________  External Attachment:    Type:   Image     Comment:   External Document

## 2010-05-28 NOTE — Progress Notes (Signed)
Summary: Refill/gh  Phone Note Refill Request Message from:  Fax from Pharmacy on April 22, 2010 11:09 AM  Refills Requested: Medication #1:  HYDROCODONE-ACETAMINOPHEN 5-325 MG TABS Take one pill once or twice a day as needed for pain..   Last Refilled: 03/15/2010  Method Requested: Electronic Initial call taken by: Angelina Ok RN,  April 22, 2010 11:09 AM  Follow-up for Phone Call        [facility]; pt. was also called. Follow-up by: Chinita Pester RN,  April 22, 2010 4:21 PM    Prescriptions: HYDROCODONE-ACETAMINOPHEN 5-325 MG TABS (HYDROCODONE-ACETAMINOPHEN) Take one pill once or twice a day as needed for pain.  #40 x 0   Entered by:   Julaine Fusi  DO   Authorized by:   Blanch Media MD   Signed by:   Julaine Fusi  DO on 04/22/2010   Method used:   Telephoned to ...       [facility] [address] (retail)       [address] [place], [place]  [address]       Ph: [phone]       Fax: [phone]   RxID:   [id number]

## 2010-05-28 NOTE — Progress Notes (Signed)
Summary: med refill/gp  Phone Note Refill Request Message from:  Fax from Pharmacy on Aug 27, 2009 4:21 PM  Refills Requested: Medication #1:  HYDROCODONE-ACETAMINOPHEN 7.5-750 MG TABS 1 up to four times a day as needed back pain   Last Refilled: 07/22/2009 Last appt. 05/27/09   Method Requested: Telephone to Pharmacy Initial call taken by: Chinita Pester RN,  Aug 27, 2009 4:21 PM  Follow-up for Phone Call        fine to refill Follow-up by: Acey Lav MD,  Aug 27, 2009 5:02 PM    Prescriptions: HYDROCODONE-ACETAMINOPHEN 7.5-750 MG TABS (HYDROCODONE-ACETAMINOPHEN) 1 up to four times a day as needed back pain  #120 x 0   Entered by:   Acey Lav MD   Authorized by:   Blanch Media MD   Signed by:   Paulette Blanch Dam MD on 08/27/2009   Method used:   Printed then faxed to ...       [facility] [address] (retail)       [address] [place], [place]  [address]       Ph: [phone]       Fax: [phone]   RxID:   [id number]   Appended Document: [facility]

## 2010-05-28 NOTE — Assessment & Plan Note (Signed)
Summary: Soc. Work  Soc. Work.  45 minutes.  Referred by Dr. Rogelia Boga.  Patient recently lost a sister in October.  Lost her mother in 2000.   Referred by Dr. Rogelia Boga for severe grief and loss reaction: patient reported crying and hearing some voices.  Dr. Rogelia Boga prescribed Ambien and Celexa recently which the patient reports has helped immensely.   The patient has hx of [facility] admission for suicidal behavior about ten years ago.  She also mentioned  undergoing rehab at that time.     Income is $900 per month social security.   Rent is around $250 and her car payment is $342 per month. She admits to a great deal of financial stress due to inability to meet her bills each month including Medicare copayments.  She does not complain about lack of food.    Family supports include a niece (also a patient here at [facility]) and brother.   The patient does not have any children.   Discussed possible resources that may be a source of support to her including Hospice individual and group services.  I have also directed her to [facility] so she can get some relief from the burden of her Medicare copays.   Message was left with Hospice to try and connect patient to counseling there.  SW f/u.

## 2010-05-28 NOTE — Progress Notes (Signed)
  Phone Note Outgoing Call   Call placed by: Blanch Media MD,  June 05, 2009 4:10 PM Call placed to: pharmacy Summary of Call: Hydroxyzine sent electronically on the 2ndwas not received.  will resend    Prescriptions: HYDROXYZINE HCL 25 MG TABS (HYDROXYZINE HCL) Take one pill by mouth two times a day for your nerves as needed.  #60 x 11   Entered and Authorized by:   Blanch Media MD   Signed by:   Blanch Media MD on 06/05/2009   Method used:   Electronically to        [facility] [address] [facility] [phone]* (retail)       [address] [place], [place]  [address]       Ph: [phone]       Fax: [phone]   RxID:   [id number]   Appended Document:     Clinical Lists Changes  Medications: Changed medication from HYDROXYZINE HCL 25 MG TABS (HYDROXYZINE HCL) Take one pill by mouth two times a day for your nerves as needed. to HYDROXYZINE PAMOATE 25 MG CAPS (HYDROXYZINE PAMOATE) take one pill two times a day as needed for your nerves - Signed Rx of HYDROXYZINE PAMOATE 25 MG CAPS (HYDROXYZINE PAMOATE) take one pill two times a day as needed for your nerves;  #60 x 11;  Signed;  Entered by: Blanch Media MD;  Authorized by: Blanch Media MD;  Method used: Historical    Prescriptions: HYDROXYZINE PAMOATE 25 MG CAPS (HYDROXYZINE PAMOATE) take one pill two times a day as needed for your nerves  #60 x 11   Entered and Authorized by:   Blanch Media MD   Signed by:   Blanch Media MD on 06/05/2009   Method used:   Historical   RxID:   [id number]

## 2010-05-28 NOTE — Progress Notes (Signed)
Summary: refill/ hla  Phone Note Refill Request Message from:  Fax from Pharmacy on December 16, 2009 11:02 AM  Refills Requested: Medication #1:  HYDROCODONE-ACETAMINOPHEN 7.5-750 MG TABS 1 up to four times a day as needed back pain   Dosage confirmed as above?Dosage Confirmed   Last Refilled: 7/23 last visit 05/2009, last labs 10/2008  Initial call taken by: Marin Roberts RN,  December 16, 2009 11:02 AM  Follow-up for Phone Call        I documented in Feb, the only time I saw her, that she was taking 2 or 3 once daily. Her refills supported that. Now her usage has increased to about 4 once daily. This is concerning. Will refill once but get her in for appt to discuss increased usage.  Would you please schedule appt within next 30 days pls? Follow-up by: Blanch Media MD,  December 16, 2009 5:01 PM  Additional Follow-up for Phone Call Additional follow up Details #1::        [facility]   Additional Follow-up by: Chinita Pester RN,  December 17, 2009 9:18 AM    Additional Follow-up for Phone Call Additional follow up Details #2::    i have called pt at 2 different ph#'s, 1 has been disconnected, the other someone else's voicemail answers but i did leave a message for pt to please call int med clinic Follow-up by: Marin Roberts RN,  December 17, 2009 3:12 PM  New/Updated Medications: HYDROCODONE-ACETAMINOPHEN 7.5-750 MG TABS (HYDROCODONE-ACETAMINOPHEN) 1 by mouth up to three times s a day as needed back pain Prescriptions: HYDROCODONE-ACETAMINOPHEN 7.5-750 MG TABS (HYDROCODONE-ACETAMINOPHEN) 1 by mouth up to three times s a day as needed back pain  #120 x 0   Entered and Authorized by:   Blanch Media MD   Signed by:   Blanch Media MD on 12/16/2009   Method used:   Print then Give to Patient   RxID:   [id number] HYDROCODONE-ACETAMINOPHEN 7.5-750 MG TABS (HYDROCODONE-ACETAMINOPHEN) 1 by mouth up to three times s a day as needed back pain  #0 x 0  Entered and Authorized by:   Blanch Media MD   Signed by:   Blanch Media MD on 12/16/2009   Method used:   Print then Give to Patient   RxID:   [id number]   Appended Document: refill/ hla again i called, the ph conts to be disconnected the other a message is left

## 2010-05-28 NOTE — Progress Notes (Signed)
Summary: refill/ hla  Phone Note Refill Request Message from:  Pharmacy on April 01, 2010 11:24 AM  Refills Requested: Medication #1:  CITALOPRAM HYDROBROMIDE 20 MG TABS Take one pill once daily   Dosage confirmed as above?Dosage Confirmed   Last Refilled: 11/18 pt states this is working and would like to continue  Initial call taken by: Marin Roberts RN,  April 01, 2010 11:25 AM  Follow-up for Phone Call       Follow-up by: Blanch Media MD,  April 01, 2010 1:53 PM    Prescriptions: CITALOPRAM HYDROBROMIDE 20 MG TABS (CITALOPRAM HYDROBROMIDE) Take one pill once daily  #31 x 5   Entered and Authorized by:   Blanch Media MD   Signed by:   Blanch Media MD on 04/01/2010   Method used:   [facility]ronically to        [facility] [address] # [phone]* (retail)       [address] [place], [place]  [address]       Ph: [phone]       Fax: [phone]   RxID:   [id number]

## 2010-05-28 NOTE — Progress Notes (Signed)
Summary: Refill/gh  Phone Note Refill Request   Refills Requested: Medication #1:  HYDROCODONE-ACETAMINOPHEN 7.5-750 MG TABS 1 up to four times a day as needed back pain  Medication #2:  LASIX 40 MG TABS Take 1 tablet by mouth once a day Pt says that she does not come in unlesss she is really sick.  Problems with getting refills on pain med.  says that she is taking the pain med for her Migraines as well.  Somestimes she may take 2 at atime.  Pt was asked to tell the doctor about her increased needs at her next appointment.  Pt was also cautioned that when taking the extra pain pills she is taking large amounts of Tylenol that may affect her liver functions.  Pt says that she only does so when she is in a lot of pain.  Which she said was not everyday.  Refills wre put in for her pain medication.  Pt says that she is currently not at home but has a new number where she can be reached [phone] if Dr. Rogelia Boga wnts to alk with her today.   Method Requested: Electronic Initial call taken by: Angelina Ok RN,  October 13, 2009 10:15 AM  Follow-up for Phone Call        Vicodin filled 5/4 for #120  That averages to just about 2.5 daily - just as she told me at her Feb appt.  Dr Wyonia Hough saw pt about yearly.  I think with the new narc policy, pts on narcs will need to be seen more freq.  However, since I don't know what that policy will actually be, there is no use creating a scene before I know tha actual details.  Therefore, will refill with one refill.  #120 should last about 7 weeks, so with one refill she should be set for 14 weeks - until about early Oct give or take.      Additional Follow-up for Phone Call Additional follow up Details #1::        Rx called to pharmacy.  Pt was alled and notified of refills.  Message left on pt's answering machine. Additional Follow-up by: Angelina Ok RN,  October 13, 2009 3:06 PM    Prescriptions: HYDROCODONE-ACETAMINOPHEN 7.5-750 MG TABS  (HYDROCODONE-ACETAMINOPHEN) 1 up to four times a day as needed back pain  #120 x 1   Entered and Authorized by:   Blanch Media MD   Signed by:   Blanch Media MD on 10/13/2009   Method used:   Telephoned to ...       [facility] [address] # [phone]* (retail)       [address] [place], [place]  [address]       Ph: [phone]       Fax: [phone]   RxID:   [id number] LASIX 40 MG TABS (FUROSEMIDE) Take 1 tablet by mouth once a day  #30 x 12   Entered and Authorized by:   Blanch Media MD   Signed by:   Blanch Media MD on 10/13/2009   Method used:   Telephoned to ...       [facility] [address] [facility] [address]* (retail)       [address] [place], [place]  [address]       Ph: [phone]       Fax: [phone]   RxID:   [id number]

## 2010-05-28 NOTE — Progress Notes (Signed)
Summary: refill/gg  Phone Note Refill Request  on May 12, 2010 3:04 PM  Refills Requested: Medication #1:  HYDROCODONE-ACETAMINOPHEN 5-325 MG TABS Take one pill once or twice a day as needed for pain.. Pt is out of pain meds, she is using for headaches and leg and back pain.  She uses about 3 - 4 a day.  Will you give her more that # 40 a month?   Method Requested: Telephone to Pharmacy Initial call taken by: Merrie Roof RN,  May 12, 2010 3:04 PM  Follow-up for Phone Call        [facility]. She sounded distraught and at times I had difficulty making our what she was saying. She is hurting in top of her head down to neck and the knot in her leg hurts. She got 40 vicodin 12/28 and out of 2 weeks bc was taking more. Normally only needs one or two a day, hence the 40. Increased pian past week. can;t sleep. Stopped the Ambien bc was doing things in her sleep - unlocking doors, etc. Out of alprazolam x 3 weeks. Still hearing noise in head and voice "Go this way". Brother sick - can't urinate - going to MD's. She is worried about him.   I am worried about her mental state, knowing her PMHx. I told her I waned to call the EMS to be brought  to the ER for eval and she adamently refused. Her thought process is organized and rational (though not making the decision I would recom). She wants to try the vicodin tonight and if no relief will call me tomorrow to come in. She will have her friend go to pharm to get med. Her godduaghter will spend the night. She promises to take two vicodin and give rest to goddaughter. She promises not to OD. She has no SI / HI. She asked for my phone number and I asked her to call the RN and she knows their number.   I really don;t think this is primarily a pain D/O. However, she is competant and rational so gave 5 vicodin with above stipulation. I would prefer that she have psych eval. Will go ahead and sch appt tomorrow just in case. Follow-up by: Blanch Media MD,  May 12, 2010 3:53 PM    Prescriptions: HYDROCODONE-ACETAMINOPHEN 5-325 MG TABS (HYDROCODONE-ACETAMINOPHEN) Take one pill once or twice a day as needed for pain.  #5 x 0   Entered and Authorized by:   Blanch Media MD   Signed by:   Blanch Media MD on 05/12/2010   Method used:   Telephoned to ...       [facility] [address] [facility] [address] (retail)       [address] [place], [place]  [address]       Ph: [phone]       Fax: [phone]   RxID:   [id number]

## 2010-05-28 NOTE — Progress Notes (Signed)
Summary: Pain Medication  Phone Note Call from Patient   Caller: Daughter Call For: Blanch Media MD Summary of Call: Call from pt's Goddaughter [facility].  Said that the medication that she got on yesterday pt had to take 2 tablets.  Took Alprasolam 0.5 mg it helped.  Pt was restless last night.  Had a bad Migraine last night.  Took 2 of the Hydrocodone.  Said that it wad not her normal dosage.  Then took the 2nd pill.  Still having real bad headaches.  Upset about her brother possibly  having Prostate Cancer.  Alprasolam is calming her down.  Did not take the other nerve pill.  Gala Murdoch said that she did not want her to take to much medication.  Only has 1 more of the 325 Hydrocodone that was called in last night.  Pt would like to get her normal dosage so that she does not have to double up.  Gladis Riffle can be reached [phone].  Pt is not suicidial  and [place]ock has been with her.  [place]ock has been monitoring her medication.  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 Initial call taken by: Angelina Ok RN,  May 13, 2010 10:26 AM  Follow-up for Phone Call        Pt much calmer today and collected. Took two hydrocodone last PM and 1 in the Am and goddaughter called her about noon to tell her to take another single pill. Helped her HA, legs, back and shoulder pain. It relaxed her some. Took Hydroxzine. She is stressed out. She thinks on good day needs one. On bad day needs 2 or 3.   Will call in #45 and sch appt 1-2 weeks to assess response.   She wants goddaughter to get paid to be her aide. Explained must need help with ADL's. Usually won;t pay relatives to be aides.   Will refer to Lupita Leash T Follow-up by: Blanch Media MD,  May 13, 2010 11:59 AM  Additional Follow-up for Phone Call Additional follow up Details #1::        Rx Called In Additional Follow-up by: Marin Roberts RN,  May 13, 2010 3:28 PM    Prescriptions: HYDROCODONE-ACETAMINOPHEN 5-325 MG TABS  (HYDROCODONE-ACETAMINOPHEN) Take one pill once or twice a day as needed for pain.  #45 x 0   Entered and Authorized by:   Blanch Media MD   Signed by:   Blanch Media MD on 05/13/2010   Method used:   Telephoned to ...         RxID:   [id number]

## 2010-05-28 NOTE — Progress Notes (Signed)
Summary: med refill/gp  Phone Note Refill Request Message from:  Fax from Pharmacy on January 13, 2010 11:50 AM  Refills Requested: Medication #1:  HYDROCODONE-ACETAMINOPHEN 7.5-750 MG TABS 1 by mouth up to three times s a day as needed back pain   Last Refilled: 12/17/2009 Last appt. 05/27/09.   Method Requested: Telephone to Pharmacy Initial call taken by: Chinita Pester RN,  January 13, 2010 11:50 AM  Follow-up for Phone Call        See the 8/23 note. I will not  refill until she schedules an appt. Once she has an appt scheduled then I will either refill one month or enough to get her untill the appt.  Follow-up by: Blanch Media MD,  January 13, 2010 2:23 PM  Additional Follow-up for Phone Call Additional follow up Details #1::        Pt. was called and message left for pt. to call the clinic to make an appt. Additional Follow-up by: Chinita Pester RN,  January 13, 2010 4:10 PM    Additional Follow-up for Phone Call Additional follow up Details #2::    when pt called i scheduled an appt for your next available, 10/11 at 0830, pt states she will be here  Initial call taken by: Marin Roberts RN,  January 14, 2010 10:32 AM Follow-up by: Blanch Media MD,  January 14, 2010 11:00 AM  Additional Follow-up for Phone Call Additional follow up Details #3:: Details for Additional Follow-up Action Taken: Ran name through [facility] narc database. Only the 07/22/09 refill appeared. Concerned. [facility] [address] Her DOB listed there is [birth year]. Her DOB is not listed on her insurance card. She got her hydrocodone 7.5 8/24 #120  7/23 (written 6/20) #120 5/4 #120  Her use has deficintely escalated recently. Will refill one month and pt has appt in Oct.   Additional Follow-up by: Blanch Media MD,  January 14, 2010 11:25 AM  Prescriptions: HYDROCODONE-ACETAMINOPHEN 7.5-750 MG TABS (HYDROCODONE-ACETAMINOPHEN) 1 by mouth up to three times s a day as needed back pain   #120 x 0   Entered and Authorized by:   Blanch Media MD   Signed by:   Blanch Media MD on 01/14/2010   Method used:   Telephoned to ...       [facility] [address] # [phone]* (retail)       [address] [place], [place]  [address]       Ph: [phone]       Fax: [phone]   RxID:   [id number]  [facility] [address]  Chinita Pester RN  January 15, 2010 8:53 AM

## 2010-05-28 NOTE — Progress Notes (Signed)
Summary: pain med/ hla  Phone Note Call from Patient   Summary of Call: pt calls angry because her pain med has not been filled, i explained that the pharm just notified the clinic and the request had been sent to her md, she told me to fill it now, i explained i couldn't, she stated nobody did her this way when dr Wyonia Hough was here and he always filled it for 6 or 7 times then she hung up on me. i have now sent the request to the attending Initial call taken by: Marin Roberts RN,  Aug 27, 2009 5:03 PM  Follow-up for Phone Call        I just approved of her request. I dont understand what the problem is here? Follow-up by: Acey Lav MD,  Aug 27, 2009 5:18 PM

## 2010-05-28 NOTE — Progress Notes (Signed)
Summary: Refill/gh  Phone Note Refill Request Message from:  Fax from Pharmacy on Sep 05, 2009 2:54 PM  Refills Requested: Medication #1:  AMLODIPINE BESYLATE 10 MG TABS Take 1 tablet by mouth once a day  Method Requested: Electronic Initial call taken by: Angelina Ok RN,  Sep 05, 2009 2:54 PM  Follow-up for Phone Call        Pt seen Feb and BP well controlled. .6 month refill. Follow-up by: Blanch Media MD,  Sep 07, 2009 7:19 PM    Prescriptions: AMLODIPINE BESYLATE 10 MG TABS (AMLODIPINE BESYLATE) Take 1 tablet by mouth once a day  #30 x 5   Entered and Authorized by:   Blanch Media MD   Signed by:   Blanch Media MD on 09/07/2009   Method used:   Electronically to        [facility] [address]* (retail)       [address]       [place], [place]  [address]       Ph: [phone] or [phone]       Fax: [phone]   RxID:   [id number]

## 2010-05-28 NOTE — Assessment & Plan Note (Signed)
Summary: needs refills, checkup/pcp-butcher/hla   Vital Signs:  Patient profile:   77 year old female Height:      60 inches (152.40 cm) Weight:      123.8 pounds (57.77 kg) BMI:     24.91 Temp:     98.4 degrees F (36.89 degrees C) oral Pulse rate:   69 / minute BP sitting:   130 / 75  (right arm) Cuff size:   regular  Vitals Entered By: Sherri Mcmillan NT II (February 03, 2010 8:39 AM) CC: MEDICATION REFILL / PAIN MED NOT STRONG ENOUGH  / HAVING LOT OF THICK MUCUS IN THROAT AT NIGHT Is Patient Diabetic? No Pain Assessment Patient in pain? yes     Location: LEFT LOWER LEG Intensity:      8 Type: BURN/STING/HARD Onset of pain  Chronic Nutritional Status BMI of 19 -24 = normal  Have you ever been in a relationship where you felt threatened, hurt or afraid?No   Does patient need assistance? Functional Status Self care Ambulation Normal Comments STILL HAVING HEADACHES   Primary Care :  Sherri Media MD  CC:  MEDICATION REFILL / PAIN MED NOT STRONG ENOUGH  / HAVING LOT OF THICK MUCUS IN THROAT AT NIGHT.  History of Present Illness: Sherri Mcmillan lost her sister, to whom she was very close, on the Sep 27, 2024She is distressed over the loss but is not sucicidal. She is having trouble sleeping. She has been out of her Xanax for two months.   Preventive Screening-Counseling & Management  Alcohol-Tobacco     Smoking Status: current     Smoking Cessation Counseling: no     Packs/Day: 1 pack every 3 weeks     Year Started: many years  Caffeine-Diet-Exercise     Does Patient Exercise: yes     Type of exercise: WALKING     Exercise (avg: min/session): 30-60     Times/week: 5  Current Medications (verified): 1)  Lasix 40 Mg Tabs (Furosemide) .... Take 1 Tablet By Mouth Once A Day 2)  Amlodipine Besylate 10 Mg Tabs (Amlodipine Besylate) .... Take 1 Tablet By Mouth Once A Day 3)  Promethazine Hcl 12.5 Mg  Tabs (Promethazine Hcl) .... One Pill Every 6 Hours As Neded  For Nausea 4)  Hydrocodone-Acetaminophen 7.5-750 Mg Tabs (Hydrocodone-Acetaminophen) .Marland Kitchen.. 1 By Mouth Up To Three Times S A Day As Needed Back Pain 5)  Alprazolam 0.5 Mg Tabs (Alprazolam) .... Take One At Bedtime.  One By Mouth Once Daily If Needed For Anxiety 6)  Hydroxyzine Pamoate 25 Mg Caps (Hydroxyzine Pamoate) .... Take One Pill Two Times A Day As Needed For Your Nerves 7)  Gabapentin 300 Mg Caps (Gabapentin) .... Take One Pill Tonight. On Wednesday Take One Pill Two Times A Day. On Thursday and Every Day After Take One Pill Three Times A Day. 8)  Morphine Sulfate 15 Mg Tabs (Morphine Sulfate) .... Take One Half Pill By Mouth Up To 4 Times Daily For Pain Along With Gabapentin.  Use The Morphine Only If Gabapentin and Hydrocodone Together Do Not Work.  Allergies: 1)  ! Asa 2)  ! * Iron Tab 3)  ! 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  Her sister died on 20-Jan-2010 and she is in  the grieving process and trying to comfort her sisters kids. Smokes one pack every two weeks.  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  Her sister died on 02/07/10  and she is in the grieving process and trying to comfort her sisters kids. Smokes one pack every two weeks.  Review of Systems General:  Complains of sleep disorder. Sherri:  Complains of low back pain; denies joint pain. Derm:  Varisocse veins LLE. Neuro:  Neuropathy like sxs of LLE. Marland Kitchen Psych:  Complains of anxiety, depression, easily angered, easily tearful, and irritability; denies suicidal thoughts/plans and thoughts of violence.  Physical Exam  General:  alert, well-developed, well-nourished, and well-hydrated.   Head:  normocephalic and atraumatic.   Eyes:  pupils equal, pupils round, and pupils reactive to light.   Ears:  R ear normal and L ear normal.   Nose:  no external deformity.   Msk:  normal ROM, no joint tenderness, no joint swelling, and no joint warmth.   Extremities:  Increased pigmentation LLE with prominant varicose veins and altered sensation.  Neurologic:  alert & oriented X3 and gait normal.   Skin:  turgor normal and no edema.   Psych:  Oriented X3, memory intact for recent and remote, normally interactive, good eye contact, not anxious appearing, and tearful.     Impression & Recommendations:  Problem # 1:  CHRONIC PAIN SYNDROME (ICD-338.4) Assessment Deteriorated Combo of DJD of knees and back,varicose veins LLE, and migranes  Pt has had this for years. Was on Tylenol #3 but then changed to Sherri Contin for a short period. Her pain improved and was changed to vicodin 5 then increased to 7.5. This used to control her pain but past few months her use of vicodin has escalted and I asked her to come in for assessment. She states that she takes 2 or even 3 at a time and it only touches her LLE pain. Her LLE remains rather painful. Her increased usage is documented be refill requests. I think her grieving is playing into her pain but her increased pain started PRIOR to the loss of her sister.   She describes a neuropathic type of pain in her LLE. Etiology unknown. Vit B 12  was in the 400's in 09 so will check that along with a TSH. Will try gabapentin along with her vicodin. If that provides reflief then I will cont that regimen. If not, she is to stop the vicodin and use gabapentina and Morphine Sulfate. I selected short acting so I could titrate up if needed.   She also has prominant LLE varicose veins but I am unaware of them causing neuropathic type pain. She certain does have other manifestations of varicose veins  though. She uses stockings. She  refuses surgical intervention at this time.   For her migranes, she get 4-6 per month and the vicodin only touches it a little recently. Out of her Phenergan. I refilled her phenergan and hopfully either the penergan, adding gabapentin as a preventative tx, or changing to Morphine Sulfate will help.   I did not fill out narc contract as grieving and permant regimen not known now.  She is to return in one month to let me know how things are going.   Problem # 2:  ANXIETY DISORDER, GENERALIZED (ICD-300.02) This is complicated by the loss of her sister. Has been on Xanax and hydroxyzone long term. Says that [facility] tried her on SSRI's but eventually settled on this regimen (no SSRI's in EMR). I do not want to start too many new meds, esp in setting of acute grief, therefore will resume old regimen and consider SSRI once acute issues are resolved.   Her updated medication list for this problem includes:    Alprazolam 0.5 Mg Tabs (Alprazolam) .Marland Kitchen... Take one at bedtime.  one by mouth once daily if needed for anxiety    Hydroxyzine Pamoate 25 Mg Caps (Hydroxyzine pamoate) .Marland Kitchen... Take one pill two times a day as needed for your nerves  Problem # 3:  HYPERTENSION, ESSENTIAL NOS (ICD-401.9) Well controlled today. Check BMP today.  Her updated medication list for this problem includes:    Lasix 40 Mg Tabs (Furosemide) .Marland Kitchen... Take 1 tablet by mouth once a day    Amlodipine Besylate 10 Mg Tabs (Amlodipine besylate) .Marland Kitchen... Take 1  tablet by mouth once a day  Orders: T-Basic Metabolic Panel [id number]) T-Lipid Profile [id number])  Problem # 4:  INSOMNIA, PERSISTENT (ICD-307.42) She will resume her Xanax. Havinf acute exacerbation of her chronic insomnia.  Problem # 5:  PREVENTIVE HEALTH CARE (ICD-V70.0) I did not really address this as she is dealing with loss of her sister. Thinks she got flu vaccine at [facility] in last 2 months. MMG - thinks she had this year but last MMG in Echart was 8/10. WIll investigate and order at F/U if needed. Colon - will need repeat but need to address at another visit. PNA - address at another visit PAP - at another visit  Orders: T-Lipid Profile ([id number])  Problem # 6:  AORTIC STENOSIS (ICD-424.1)  Will need repeat ECHO sometime between later this year and 2013.  Asymptomatic and the AS was mild on 08 ECHO.  Problem # 7:  GRIEF REACTION, ACUTE (ICD-309.0) See above. Pt not interested in couseling - her minister is helping her along with her volunteer activities and family and friends.  Complete Medication List: 1)  Lasix 40 Mg Tabs (Furosemide) .... Take 1 tablet by mouth once a day 2)  Amlodipine Besylate 10 Mg Tabs (Amlodipine besylate) .... Take 1 tablet by mouth once a day 3)  Promethazine Hcl 12.5 Mg Tabs (Promethazine hcl) .... One pill every 6 hours as neded for nausea 4)  Hydrocodone-acetaminophen 7.5-750 Mg Tabs (Hydrocodone-acetaminophen) .Marland Kitchen.. 1 by mouth up to three times s a day as needed back pain 5)  Alprazolam 0.5 Mg Tabs (Alprazolam) .... Take one at bedtime.  one by mouth once daily if needed for anxiety 6)  Hydroxyzine Pamoate 25 Mg Caps (Hydroxyzine pamoate) .... Take one pill two times a day as needed for your nerves 7)  Gabapentin 300 Mg Caps (Gabapentin) .... Take one pill tonight. on wednesday take one pill two times a day. on thursday and every day after take  one pill three times a day. 8)  Morphine Sulfate 15 Mg Tabs (Morphine sulfate) .... Take one  half pill by mouth up to 4 times daily for pain along with gabapentin.  use the morphine only if gabapentin and hydrocodone together do not work.  Other Orders: T-TSH [id number]) T-Vitamin B12 [id number])  Patient Instructions: 1)  Please see me in one month to let me know how the new pain medicines are working.  Prescriptions: MORPHINE SULFATE 15 MG TABS (MORPHINE SULFATE) Take one half pill by mouth up to 4 times daily for pain along with gabapentin.  Use the morphine only if gabapentin and hydrocodone together do not work.  #120 x 0   Entered and Authorized by:   Sherri Media MD   Signed by:   Sherri Media MD on 02/03/2010   Method used:   Print then Give to Patient   RxID:   [id number] PROMETHAZINE HCL 12.5 MG  TABS (PROMETHAZINE HCL) one pill every 6 hours as neded for nausea  #15 x 5   Entered and Authorized by:   Sherri Media MD   Signed by:   Sherri Media MD on 02/03/2010   Method used:   Print then Give to Patient   RxID:   [id number] ALPRAZOLAM 0.5 MG TABS (ALPRAZOLAM) Take one at bedtime.  One by mouth once daily if needed for anxiety  #60 x 2   Entered and Authorized by:   Sherri Media MD   Signed by:   Sherri Media MD on 02/03/2010   Method used:   Print then Give to Patient   RxID:   [id number] GABAPENTIN 300 MG CAPS (GABAPENTIN) Take one pill tonight. On wednesday take one pill two times a day. On Thursday and every day after take one pill three times a day.  #90 x 2   Entered and Authorized by:   Sherri Media MD   Signed by:   Sherri Media MD on 02/03/2010   Method used:   Print then Give to Patient   RxID:   [id number]  Process Orders Check Orders Results:     [facility]: Check successful Tests Sent for requisitioning (February 04, 2010 11:30 AM):     02/03/2010: [facility] -- T-Basic Metabolic Panel [id number] (signed)     02/03/2010: [facility]  -- T-Lipid Profile [id number] (signed)     02/03/2010: [facility] -- T-TSH [id number] (signed)     02/03/2010: [facility] -- T-Vitamin B12 [[id number]] (signed)     Prevention & Chronic Care Immunizations   Influenza vaccine: Not documented   Influenza vaccine deferral: Deferred  (02/03/2010)    Tetanus booster: Not documented   Td booster deferral: Deferred  (05/27/2009)    Pneumococcal vaccine: Not documented   Pneumococcal vaccine deferral: Deferred  (05/27/2009)    H. zoster vaccine: Not documented   H. zoster vaccine deferral: Deferred  (05/27/2009)  Colorectal Screening   Hemoccult: Not documented   Hemoccult action/deferral: Not indicated  (05/27/2009)    Colonoscopy: adenomatous polyp  (03/04/1999)   Colonoscopy action/deferral: Deferred  (05/27/2009)  Other Screening   Pap smear: Not documented   Pap smear action/deferral: Deferred  (05/27/2009)    Mammogram: BI-RADS CATEGORY 1:  Negative.^[facility]  (12/19/2008)   Mammogram action/deferral: Screening mammogram in 1 year.     (12/02/2006)    DXA bone density scan: Not documented   DXA bone density action/deferral: Deferred  (05/27/2009)   Smoking status: current  (  02/03/2010)   Smoking cessation counseling: no  (02/03/2010)  Lipids   Total Cholesterol: Not documented   LDL: Not documented   LDL Direct: Not documented   HDL: Not documented   Triglycerides: Not documented  Hypertension   Last Blood Pressure: 130 / 75  (02/03/2010)   Serum creatinine: 0.76  (11/15/2008)   Serum potassium 4.1  (11/15/2008)    Hypertension flowsheet reviewed?: Yes   Progress toward BP goal: At goal    Stage of readiness to change (hypertension management): Maintenance  Self-Management Support :   Personal Goals (by the next clinic visit) :      Personal blood pressure goal: 140/90  (02/03/2010)   Patient will work on the following items until the next  clinic visit to reach self-care goals:     Medications and monitoring: take my medicines every day, check my blood sugar  (02/03/2010)     Eating: drink diet soda or water instead of juice or soda, eat more vegetables, use fresh or frozen vegetables, eat foods that are low in salt, eat baked foods instead of fried foods, eat fruit for snacks and desserts, limit or avoid alcohol  (02/03/2010)     Activity: take a 30 minute walk every day  (02/03/2010)    Hypertension self-management support: Resources for patients handout  (02/03/2010)      Resource handout printed.

## 2010-05-28 NOTE — Assessment & Plan Note (Signed)
Summary: EST-1 MONTH F/U VISIT/CH   Vital Signs:  Patient profile:   77 year old female Height:      60 inches (152.40 cm) Weight:      126.8 pounds (57.64 kg) BMI:     24.85 Temp:     97.4 degrees F (36.33 degrees C) oral Pulse rate:   65 / minute BP sitting:   141 / 78  (right arm) Cuff size:   regular  Vitals Entered By: [name] NT II (March 10, 2010 9:47 AM) CC: PATIENT STATES THAT HER GABPENTIN IS MAKES HER NERVOUS AND DEPRESSED, CRYING, Hypertension Management Is Patient Diabetic? No Pain Assessment Patient in pain? no      Nutritional Status BMI of 19 -24 = normal  Have you ever been in a relationship where you felt threatened, hurt or afraid?No   Does patient need assistance? Functional Status Self care Ambulation Normal   Primary Care Provider:  Blanch Media MD  CC:  PATIENT STATES THAT HER GABPENTIN IS MAKES HER NERVOUS AND DEPRESSED, CRYING, and Hypertension Management.  History of Present Illness: Sherri Mcmillan is here to F/U from her last visit on 02/03/10. She lost her sister on the 17th of Sept and has been very distressed over the loss. At her last appt, she was having trouble sleeping and had been out of her xanax quite some time. I refilled it. Her grief rxn is severe. She is up until 4 AM, sleeps 4-6 AM, and then back up at 6AM. She cleans all day to stay busy. She constantly hears a noise in her head but denies voices. She is extremely nervous. She tries to get out and walk around her neighborhood but got lost. She is taking her alprazolam and hydroxyzine without improvement. She talks to her sister's picture and her brother who generally is support wants to remove the picture. She alsio talks to he Programmer, multimedia. She finds her slf crying unexpected and without noticing. 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   Of concern is that she admitted after her mother died years ago, she had a mental breakdown. She  was hearing voices. Her neighbors found her in the kitchen with a knife and she ended up in [facility] for 6 weeks. She was on an antidepressant (couldn't find which on in EMR or e chart) and had a therapist. She made sig improvements and was D/C'd from both. She does not want to get that bad again.  I asked specifically about SI and she was quiet for a very long time and then told me the above about her prior mental issues. She denies SI / HI today.   Her grief rxn was also aggrevating her chronic pain and I added gabapentin to her hydrocodone. She stated that the gabapentin made her confused and depressed. I had also given her an Rx for Morphine Sulfate but it caused nausea and she flushed it down the toliet. She went back to monotherapy with hydrocodone but was taking more than rx without sig rellief. She has been out of it for several days now and doesn't appear to be inseveral pain and really didn't mention the pain much today.    Hypertension History:      Positive major cardiovascular risk factors include female age 74 years old or older, hyperlipidemia, hypertension, and current tobacco user.    Preventive Screening-Counseling & Management  Alcohol-Tobacco     Smoking Status: current  Smoking Cessation Counseling: no     Packs/Day: 1 pack every 3 weeks     Year Started: many years  Caffeine-Diet-Exercise     Does Patient Exercise: yes     Type of exercise: WALKING     Exercise (avg: min/session): 30-60     Times/week: 5  Medications Prior to Update: 1)  Lasix 40 Mg Tabs (Furosemide) .... Take 1 Tablet By Mouth Once A Day 2)  Amlodipine Besylate 10 Mg Tabs (Amlodipine Besylate) .... Take 1 Tablet By Mouth Once A Day 3)  Promethazine Hcl 12.5 Mg  Tabs (Promethazine Hcl) .... One Pill Every 6 Hours As Neded For Nausea 4)  Hydrocodone-Acetaminophen 7.5-750 Mg Tabs (Hydrocodone-Acetaminophen) .Marland Kitchen.. 1 By Mouth Up To Three Times S A Day As Needed Back Pain 5)  Alprazolam 0.5 Mg Tabs  (Alprazolam) .... Take One At Bedtime.  One By Mouth Once Daily If Needed For Anxiety 6)  Hydroxyzine Pamoate 25 Mg Caps (Hydroxyzine Pamoate) .... Take One Pill Two Times A Day As Needed For Your Nerves 7)  Gabapentin 300 Mg Caps (Gabapentin) .... Take One Pill Tonight. On Wednesday Take One Pill Two Times A Day. On Thursday and Every Day After Take One Pill Three Times A Day. 8)  Morphine Sulfate 15 Mg Tabs (Morphine Sulfate) .... Take One Half Pill By Mouth Up To 4 Times Daily For Pain Along With Gabapentin.  Use The Morphine Only If Gabapentin and Hydrocodone Together Do Not Work.  Current Medications (verified): 1)  Lasix 40 Mg Tabs (Furosemide) .... Take 1 Tablet By Mouth Once A Day 2)  Amlodipine Besylate 10 Mg Tabs (Amlodipine Besylate) .... Take 1 Tablet By Mouth Once A Day 3)  Promethazine Hcl 12.5 Mg  Tabs (Promethazine Hcl) .... One Pill Every 6 Hours As Neded For Nausea 4)  Alprazolam 0.5 Mg Tabs (Alprazolam) .... Take One At Bedtime.  One By Mouth Once Daily If Needed For Anxiety 5)  Hydroxyzine Pamoate 25 Mg Caps (Hydroxyzine Pamoate) .... Take One Pill Two Times A Day As Needed For Your Nerves 6)  Citalopram Hydrobromide 20 Mg Tabs (Citalopram Hydrobromide) .... Take One Pill Once Daily 7)  Zolpidem Tartrate 10 Mg Tabs (Zolpidem Tartrate) .... Take One Pill At Bedtime For Sleep  Allergies: 1)  ! Asa 2)  ! * Iron Tab 3)  ! 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  Sings with church and has several CD's Her sister died on 01-31-10 and she is in the grieving process and trying to comfort her sisters kids. Smokes one pack every two weeks.  Review of Systems General:  Complains of fatigue and sleep disorder. GI:  Denies nausea. Sherri:  Complains of low back pain, muscle, and cramps. Neuro:  Complains of difficulty with concentration.  Psych:  Complains of anxiety, depression, easily tearful, and panic attacks; denies alternate hallucination ( auditory/visual), suicidal thoughts/plans, thoughts of violence, unusual visions or sounds, and thoughts /plans of harming others.  Physical Exam  General:  alert, well-developed, well-nourished, and well-hydrated.   Head:  normocephalic and atraumatic.   Eyes:  vision grossly intact and pupils equal.   Ears:  R ear normal and L ear normal.   Nose:  no external deformity and no nasal discharge.   Neurologic:  alert & oriented X3 and gait normal.   Skin:  turgor normal, color normal, and no rashes.   Psych:  Oriented X3, memory intact for recent and remote, normally interactive, good eye contact, not suicidal, not homicidal, and tearful.     Impression & Recommendations:  Problem # 1:  GRIEF REACTION, ACUTE (ICD-309.0) Assessment Deteriorated Her grief rxn is not yet prolonged but seems rather severe. And I am rather concerned given the history after the loss of her mother. She has been on an antidepressant before and had no bad rxn. I was unable to find out which one she was on so elected to start Celexa. She had enough of her alprazolam and hydroxyzine to get her by until her F/U appt. I referred to to Lupita Leash but pt was unable to wait any longer. I made a F/U appt on Fri with both an [facility] resident and Lupita Leash T. Pt is open to idea of more intensive therapy.  Orders: Social Work Referral (Social )  Problem # 2:  INSOMNIA, PERSISTENT (ICD-307.42) Assessment: Deteriorated Acute flare up of chronic insomnia.  Has been on Ambien 10 mg prior without bad rxn. I gave her only 3 since I am so worried about her and asked to F/U Fri. If helped, can Rx additional pills with close F/U.  Problem # 3:  CHRONIC PAIN SYNDROME (ICD-338.4) Assessment: Unchanged She has been off the hydrocodone for a few days. This was a new med - trial to see if helped. Really didn't see improvement. I did NOT refill today since it did't help and since I am concerned about severe grief RXN. I also took the bottle of gabapemntin and Sherri Mcmillan and I disposed of it according to protocol.  Problem # 4:  HYPERTENSION, ESSENTIAL NOS (ICD-401.9) Assessment: Deteriorated Elevated today but pt has not been taking her lasix and has acute issues. No intervention today. Her updated medication list for this problem includes:    Lasix 40 Mg Tabs (Furosemide) .Marland Kitchen... Take 1 tablet by mouth once a day    Amlodipine Besylate 10 Mg Tabs (Amlodipine besylate) .Marland Kitchen... Take 1 tablet by mouth once a day  Problem # 5:  AORTIC STENOSIS (ICD-424.1) Assessment: Unchanged Due to acute issues, was not addressed today. Will need repeat ECHO sometime between later this year and 2013.  Asymptomatic and the AS was mild on 08 ECHO.  Problem # 6:  HYPERLIPIDEMIA (ICD-272.4) Assessment: Deteriorated Her 10 year risk (decreasing her age to 80 as the calculator won't go to age 12) is 21% ish. If consider her non smoker (which I wouldn't even though she says 1 pak lasts 2 weeks) risk is 12% ish. So goal for LDL is 130 or 100. She needs a statin but starting a statin in the middle of her grief rxn not ideal.  Problem # 7:  PREVENTIVE HEALTH CARE (ICD-V70.0) Assessment: Unchanged [facility] - [facility] - thinks she had this year but last MMG in Echart was 8/10. WIll investigate  and order at F/U if needed. Colon - will need repeat but need to address at another visit. PNA - address at another visit PAP - at another visit  Complete Medication List: 1)  Lasix 40 Mg  Tabs (Furosemide) .... Take 1 tablet by mouth once a day 2)  Amlodipine Besylate 10 Mg Tabs (Amlodipine besylate) .... Take 1 tablet by mouth once a day 3)  Promethazine Hcl 12.5 Mg Tabs (Promethazine hcl) .... One pill every 6 hours as neded for nausea 4)  Alprazolam 0.5 Mg Tabs (Alprazolam) .... Take one at bedtime.  one by mouth once daily if needed for anxiety 5)  Hydroxyzine Pamoate 25 Mg Caps (Hydroxyzine pamoate) .... Take one pill two times a day as needed for your nerves 6)  Citalopram Hydrobromide 20 Mg Tabs (Citalopram hydrobromide) .... Take one pill once daily 7)  Zolpidem Tartrate 10 Mg Tabs (Zolpidem tartrate) .... Take one pill at bedtime for sleep  Hypertension Assessment/Plan:      The patient's hypertensive risk group is category B: At least one risk factor (excluding diabetes) with no target organ damage.  Her calculated 10 year risk of coronary heart disease is 13 %.  Today's blood pressure is 141/78.     Patient Instructions: 1)  Pls schedule with [facility] resident Friday for acute visit. 2)  Do not combine the Ambien with the two other nerve pills Prescriptions: ZOLPIDEM TARTRATE 10 MG TABS (ZOLPIDEM TARTRATE) Take one pill at bedtime for sleep  #3 x 0   Entered and Authorized by:   Blanch Media MD   Signed by:   Blanch Media MD on 03/10/2010   Method used:   Print then Give to Patient   RxID:   [id number] CITALOPRAM HYDROBROMIDE 20 MG TABS (CITALOPRAM HYDROBROMIDE) Take one pill once daily  #15 x 0   Entered and Authorized by:   Blanch Media MD   Signed by:   Blanch Media MD on 03/10/2010   Method used:   Print then Give to Patient   RxID:   [id number]    Orders Added: 1)  Social Work Referral Lelon Mast ] 2)  Est. Patient Level III [[address]]     Prevention & Chronic Care Immunizations   Influenza vaccine: Not documented   Influenza vaccine deferral: Deferred  (02/03/2010)    Tetanus booster: Not documented   Td booster deferral:  Deferred  (05/27/2009)    Pneumococcal vaccine: Not documented   Pneumococcal vaccine deferral: Deferred  (05/27/2009)    H. zoster vaccine: Not documented   H. zoster vaccine deferral: Deferred  (05/27/2009)  Colorectal Screening   Hemoccult: Not documented   Hemoccult action/deferral: Not indicated  (05/27/2009)    Colonoscopy: adenomatous polyp  (03/04/1999)   Colonoscopy action/deferral: Deferred  (05/27/2009)  Other Screening   Pap smear: Not documented   Pap smear action/deferral: Deferred  (05/27/2009)    Mammogram: BI-RADS CATEGORY 1:  Negative.^MM DIGITAL DIAGNOSTIC BILAT LTD  (12/19/2008)   Mammogram action/deferral: Screening mammogram in 1 year.     (12/02/2006)    DXA bone density scan: Not documented   DXA bone density action/deferral: Deferred  (05/27/2009)   Smoking status: current  (03/10/2010)   Smoking cessation counseling: no  (03/10/2010)  Lipids   Total Cholesterol: 248  (02/03/2010)   LDL: 155  (02/03/2010)   LDL Direct: Not documented   HDL: 85  (02/03/2010)   Triglycerides: 41  (02/03/2010)    SGOT (AST): Not documented   SGPT (ALT): Not documented  Alkaline phosphatase: Not documented   Total bilirubin: Not documented  Hypertension   Last Blood Pressure: 141 / 78  (03/10/2010)   Serum creatinine: 0.82  (02/03/2010)   Serum potassium 3.8  (02/03/2010)  Self-Management Support :   Personal Goals (by the next clinic visit) :      Personal blood pressure goal: 140/90  (02/03/2010)   Patient will work on the following items until the next clinic visit to reach self-care goals:     Medications and monitoring: take my medicines every day  (03/10/2010)     Eating: drink diet soda or water instead of juice or soda, eat more vegetables, use fresh or frozen vegetables, eat foods that are low in salt, eat baked foods instead of fried foods, eat fruit for snacks and desserts, limit or avoid alcohol  (03/10/2010)     Activity: take a 30 minute walk  every day  (03/10/2010)    Hypertension self-management support: Resources for patients handout  (03/10/2010)    Lipid self-management support: Resources for patients handout  (03/10/2010)     Self-management comments: PATIENT WALKS WHEN SHE IS ABLE      Resource handout printed.  Appended Document: EST-1 MONTH F/U VISIT/CH Prescription for Zolpidem Tartrate 10 mg #3  no refills and Citalopram 20 mg tablets # 15 no refills called to [facility] per order of Dr. Rogelia Boga. Angelina Ok, RN March 16, 2010 2:50 PM

## 2010-05-28 NOTE — Progress Notes (Signed)
Summary: refill/ hla  Phone Note Refill Request Message from:  Fax from Pharmacy on July 22, 2009 11:07 AM  Refills Requested: Medication #1:  HYDROCODONE-ACETAMINOPHEN 7.5-750 MG TABS 1 up to four times a day as needed back pain   Last Refilled: 3/1 Initial call taken by: Marin Roberts RN,  July 22, 2009 11:07 AM  Follow-up for Phone Call        Reviewed chart.  Mediciation is due.  Will refill Follow-up by: Blanch Media MD,  July 22, 2009 2:10 PM    Prescriptions: HYDROCODONE-ACETAMINOPHEN 7.5-750 MG TABS (HYDROCODONE-ACETAMINOPHEN) 1 up to four times a day as needed back pain  #120 x 0   Entered and Authorized by:   Blanch Media MD   Signed by:   Blanch Media MD on 07/22/2009   Method used:   Print then Give to Patient   RxID:   [id number]   Appended Document: refill/ hla rx called in

## 2010-05-29 ENCOUNTER — Encounter: Payer: Self-pay | Admitting: Licensed Clinical Social Worker

## 2010-05-29 NOTE — Telephone Encounter (Signed)
Faxed [facility] referral on 05/28/10 in AM.

## 2010-05-29 NOTE — Progress Notes (Deleted)
  Subjective:    Patient ID: Sherri Mcmillan, female    DOB: 10/04/1931, 77 y.o.   MRN: [id number]  HPI    Review of Systems     Objective:   Physical Exam        Assessment & Plan:

## 2010-06-01 ENCOUNTER — Telehealth: Payer: Self-pay | Admitting: Internal Medicine

## 2010-06-01 MED ORDER — HYDROCODONE-ACETAMINOPHEN 5-500 MG PO TABS: 1.0000 | ORAL_TABLET | Freq: Two times a day (BID) | ORAL | Status: DC | PRN

## 2010-06-01 MED ORDER — HYDROCODONE-ACETAMINOPHEN 5-325 MG PO TABS: 1.0000 | ORAL_TABLET | Freq: Two times a day (BID) | ORAL | Status: DC | PRN

## 2010-06-01 NOTE — Telephone Encounter (Signed)
Spoke w/ pt she states her appt was changed to 2/24 at 915 am and she plans on keeping the appt at mental health

## 2010-06-01 NOTE — Telephone Encounter (Signed)
Pt may get the 45 that Dr Odis Luster ordered. I put in the order into [facility]. Plx ask if pt went to mental health on the 1st. That is a condition of cont to get narcs. Thanks

## 2010-06-01 NOTE — Telephone Encounter (Signed)
Pt calls and states she cannot get her pain med, spoke w/ pharm, they are out of stock 5/325 may we substitute 5/500

## 2010-06-03 NOTE — Assessment & Plan Note (Signed)
Summary: Soc. Work  45 min plus 30 min in phone calls.  Multiple health and MH issues,  Medicare only. Met with Sherri Mcmillan and friend Sherri Mcmillan for further assessment and planning regarding grief and loss, mental health issues and other concerns.  Sherri Mcmillan is not doing well emotionally and financially and so she is requesting an aide at this time. Her friend Sherri Mcmillan is with her (cell [phone]) and she helps Sherri Mcmillan with errands, light housekeeping, pick up of medications.   I explained to her how Medicaid has a program for in-home aide and unfortunately she does not qualify for full Medicaid since her SSD check is 1,020. She is also very independent for a 77 yr old woman and it is questionable that she would qualify in terms of ADL's at this particular time. 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  The pt has a car payment of $349 and this will not be paid off in a short period of time.    We discussed [facility] for mail delivery of her medications and they will work with her on copays. She is receptive to that option.   Dr. Odis Luster also wanted a psychiatric appmt made and after checking with sev. MH providers including [facility], [facility] was the only option under Medicare and they had a cancellation on Wed Feb 1st at 8:30 AM with Dr. Artis Delay which she and Sherri Mcmillan agreed that she could get to.  They have the [facility] phone number [phone] should they need to change that appmt.

## 2010-06-03 NOTE — Letter (Signed)
Summary: Generic Letter  [facility]  [address]   [place], [place] [address]   Phone: [phone]  Fax: [phone]    05/25/2010      You have an appointment with Dr. Artis Delay at [facility] on Wed. Feb. 1st at 8:30 AM.    The address is [address] in [place] and the phone number is [phone].  Please arrive early for your appointment to complete any paperwork.

## 2010-06-03 NOTE — Letter (Signed)
  [facility]  [address]   [place], [place] [address]   Phone: [phone]  Fax: [phone]    05/25/2010     [facility] [place], [place]  [address]   Sherri Mcmillan  dob:[birth year]   To Whom It May Concern:  [name] is a long-time patient of the [facility]. She is 77 years old and coping with chronic health and mental health problems that are interfering with her ability to function on a daily basis.  She is in need of home aide assistance that can help her with housekeeping, errands, medication management, meals and on some days assistance with bathing and dressing.   Please evaluate [name] for any benefits that may be available to her to assist her with the above activities of daily living.   If you have any questions, please do not hesitate to contact me or our social worker, Dorothe Pea, at [phone].   Sincerely,     Whitney Post, MD         Dorothe Pea, LCSW Physician, [facility]     Clin. Social Worker, [employer]

## 2010-06-03 NOTE — Assessment & Plan Note (Signed)
Summary: ACUTE-F/U WITH MEDS/CFB([facility])   Vital Signs:  Patient profile:   77 year old female Height:      60 inches (152.40 cm) Weight:      127.4 pounds (54.41 kg) BMI:     23.46 Temp:     97.4 degrees F (36.33 degrees C) oral Pulse rate:   69 / minute BP sitting:   131 / 73  (right arm) Cuff size:   regular  Vitals Entered By: [name] NT II (May 25, 2010 9:31 AM) Is Patient Diabetic? No Pain Assessment Patient in pain? no      Nutritional Status BMI of 19 -24 = normal  Have you ever been in a relationship where you felt threatened, hurt or afraid?No   Does patient need assistance? Functional Status Self care Ambulation Normal   Primary Care Provider:  Blanch Media MD   History of Present Illness: 77yo W with chronic pain, insomnia, acute grief reaction and remote hx of suicidal behavior requiring hospitalization presents with complaints of ongoing pain, insomnia, and depressive symptoms. She has had trouble coping with the death of her sister in 02-15-2023 and is currently also distraught because her brother has been ill. She reports difficulty sleeping, constant pain in her neck and shoulders, and severe "headaches" that occur daily. When asked about these headaches, she says that they are accompanied by noises and voices in her head. It is not clear what these voices are saying. She says that this happened to her once before many years ago and she thinks it has been caused by the stress of losing her sister and her brother's illness. She denies any suicideal ideation or thoughts of hurting herself or others. She says that she saw a psychiatrist many years ago (Dr. Mikey Bussing). She does not like the idea of going to behavioral health but she is willing to go if we think it is best; she acknowledges that she needs help. Her friend Shirlee Limerick is with her in clinic today and says that she helps with her medication and getting to her doctor's visits. She has also been  staying with her some nights to look after her. Ms. Mcallister reports taking 3-4 vicodin daily to control her pain and has run out of the 45 tablets prescribed 2 weeks ago.   Preventive Screening-Counseling & Management  Alcohol-Tobacco     Smoking Status: current     Smoking Cessation Counseling: no     Packs/Day: 1 pack every 3 weeks     Year Started: many years  Caffeine-Diet-Exercise     Does Patient Exercise: yes     Type of exercise: WALKING     Exercise (avg: min/session): 30-60     Times/week: 5  Current Medications (verified): 1)  Lasix 40 Mg Tabs (Furosemide) .... Take 1 Tablet By Mouth Once A Day 2)  Amlodipine Besylate 10 Mg Tabs (Amlodipine Besylate) .... Take 1 Tablet By Mouth Once A Day 3)  Promethazine Hcl 12.5 Mg  Tabs (Promethazine Hcl) .... One Pill Every 6 Hours As Neded For Nausea 4)  Alprazolam 0.5 Mg Tabs (Alprazolam) .... Take One At Bedtime.  One By Mouth Once Daily If Needed For Anxiety 5)  Hydroxyzine Pamoate 25 Mg Caps (Hydroxyzine Pamoate) .... Take One Pill Two Times A Day As Needed For Your Nerves 6)  Citalopram Hydrobromide 40 Mg Tabs (Citalopram Hydrobromide) .... Take 1 Tablet By Mouth Once A Day 7)  Hydrocodone-Acetaminophen 5-325 Mg Tabs (Hydrocodone-Acetaminophen) .... Take One Pill Once or Twice  A Day As Needed For Pain.  Allergies: 1)  ! Asa 2)  ! * Iron Tab 3)  ! 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  Sings with church and has several CD's Her sister died on 31-Jan-2010 and she is in the grieving process and trying to comfort her sisters kids. Smokes one pack every two weeks.  Review of Systems      See HPI General:  Complains of sleep disorder; denies chills, fever, and loss of appetite. ENT:  Denies sore throat. CV:  Denies chest pain or discomfort, lightheadness, and palpitations. Resp:  Denies cough and shortness of breath. GI:  Denies abdominal pain and change in bowel habits. MS:  Complains of joint pain. Neuro:  Complains of headaches; denies disturbances in coordination, falling down, numbness, and tingling. Psych:  Complains of mental problems and unusual visions or sounds; denies suicidal thoughts/plans, thoughts of violence, and thoughts /plans of harming others.  Physical Exam  General:  alert and cooperative to examination. No acute distress.  Head:  normocephalic and atraumatic.   Eyes:  vision grossly intact, pupils equal, pupils round, and pupils reactive to light.   Mouth:  pharynx pink and moist.   Neck:  supple and no masses.   Lungs:  normal breath sounds, no crackles, and no wheezes.   Heart:  normal rate, regular rhythm, no murmur, no gallop, and no rub.   Abdomen:  soft, non-tender, normal bowel sounds, and no distention.   Msk:  normal ROM.   Extremities:  No edema.  Neurologic:  alert & oriented X3, cranial nerves grossly intact, strength normal in all extremities, and sensation intact to light touch.   Skin:  turgor normal and no rashes.   Psych:  Oriented X3, good eye contact, not suicidal, not homicidal, and moderately anxious.     Impression & Recommendations:  Problem # 1:  GRIEF REACTION, ACUTE (ICD-309.0) Assessment Deteriorated Ms. Parlee's symptoms of hearing voices, having difficulty sleeping, and feeling depressed and anxious related to the  loss of her sister and her brother's illness are very concerning, particularly in context of her apparent remote hx of suicidal behavior and behavioral health admission. She denies any thoughts of harming herself or others. She agrees to seek immediate medical care if she develops such thoughts. She has not been seen by a psychiatrist for many years and it is unclear to me what psychiatric diagnosis she may have had in the past. I believe that she needs to be under the care of a psychiatrist; I explained this to the patient and she says that she will go if we can get her an appointment. I have spoken to [employer]  who is working on arranging an appointment with [employer]. In the meantime, I will increase her citalopram to 40mg . She will continue to take alprazolam for anxiety and to help with sleep (she stopped taking Ambien because she felt like it was making her act strange). If she does well with increased dose of citalopram, will consider starting her on seroquel to take at bedtime.   Orders: Psychiatric Referral (Psych)  Problem # 2:  CHRONIC PAIN SYNDROME (ICD-338.4) Although I think much of her pain is driven by her psychiatric illness, I do believe that she has significant pain and currently relies on vicodin; will provide small refill today. Use of opiates is concerning given her psychiatric history. However, patient and her friend Shirlee Limerick understand the risks of taking opiate pain medication and she assures me that she has no intention of taking too much or using pills to harm herself.   Problem # 3:  INSOMNIA, PERSISTENT (ICD-307.42) Will continue alprazolam at night. Patient stopped Ambien because she felt that it made her act strange. Will consider adding seroquel as discussed above.   Complete Medication List: 1)  Lasix 40 Mg Tabs (Furosemide) .... Take 1 tablet by mouth once a day 2)  Amlodipine Besylate 10 Mg Tabs (Amlodipine besylate) .... Take 1 tablet by mouth once a  day 3)  Promethazine Hcl 12.5 Mg Tabs (Promethazine hcl) .... One pill every 6 hours as neded for nausea 4)  Alprazolam 0.5 Mg Tabs (Alprazolam) .... Take one at bedtime.  one by mouth once daily if needed for anxiety 5)  Hydroxyzine Pamoate 25 Mg Caps (Hydroxyzine pamoate) .... Take one pill two times a day as needed for your nerves 6)  Citalopram Hydrobromide 40 Mg Tabs (Citalopram hydrobromide) .... Take 1 tablet by mouth once a day 7)  Hydrocodone-acetaminophen 5-325 Mg Tabs (Hydrocodone-acetaminophen) .... Take one pill once or twice a day as needed for pain.  Patient Instructions: 1)  Please schedule a follow-up appointment in 2-3 weeks (preferably with Dr. Rogelia Boga or Dr. Odis Luster but may schedule with any doctor).  2)  Increase citalopram from 20mg  to 40mg  daily.  Prescriptions: CITALOPRAM HYDROBROMIDE 40 MG TABS (CITALOPRAM HYDROBROMIDE) Take 1 tablet by mouth once a day  #30 x 2   Entered and Authorized by:   Whitney Post MD   Signed by:   Whitney Post MD on 05/25/2010   Method used:   Electronically to        [facility] # [phone]* (retail)       [address] [place], [place]  [address]       Ph: [phone]       Fax: [phone]   RxID:   [id number] HYDROCODONE-ACETAMINOPHEN 5-325 MG TABS (HYDROCODONE-ACETAMINOPHEN) Take one pill once or twice a day as needed for pain.  #45 x 0   Entered and Authorized by:   Whitney Post MD   Signed by:   Whitney Post MD on 05/25/2010   Method used:   Print then Give to Patient   RxID:   [id number]    Orders Added: 1)  Psychiatric Referral [Psych] 2)  Est. Patient Level IV [34742]

## 2010-06-09 ENCOUNTER — Encounter: Payer: Self-pay | Admitting: Licensed Clinical Social Worker

## 2010-06-09 NOTE — Progress Notes (Signed)
Patient has appointment at [facility] on March 7th at 10:30 AM.  This was rescheduled from a previous appmt that Ms. Dreese cancelled as she didn't have anyone to go with her.

## 2010-06-09 NOTE — Progress Notes (Signed)
  Subjective:    Patient ID: Sherri Mcmillan, female    DOB: 10/04/1931, 78 y.o.   MRN: [id number]  HPI    Review of Systems     Objective:   Physical Exam        Assessment & Plan:   

## 2010-06-12 ENCOUNTER — Telehealth: Payer: Self-pay | Admitting: *Deleted

## 2010-06-12 NOTE — Telephone Encounter (Signed)
[facility] came by and asked if you could fill out the paperwork for [facility]. She can't afford her meds and this will help her. The form is in your box in the clinic. thanks

## 2010-06-15 NOTE — Telephone Encounter (Signed)
I have it completed. I will bring to [facility] tomorrow during my clinic.

## 2010-06-19 ENCOUNTER — Encounter: Payer: Self-pay | Admitting: Internal Medicine

## 2010-06-19 ENCOUNTER — Ambulatory Visit (INDEPENDENT_AMBULATORY_CARE_PROVIDER_SITE_OTHER): Payer: Medicare Other | Admitting: Internal Medicine

## 2010-06-19 VITALS — BP 116/67 | HR 58 | Temp 97.5°F | Ht 60.0 in | Wt 126.9 lb

## 2010-06-19 DIAGNOSIS — M1990 Unspecified osteoarthritis, unspecified site: Secondary | ICD-10-CM

## 2010-06-19 DIAGNOSIS — F4321 Adjustment disorder with depressed mood: Secondary | ICD-10-CM

## 2010-06-19 DIAGNOSIS — E785 Hyperlipidemia, unspecified: Secondary | ICD-10-CM

## 2010-06-19 DIAGNOSIS — I10 Essential (primary) hypertension: Secondary | ICD-10-CM

## 2010-06-19 MED ORDER — AMLODIPINE BESYLATE 10 MG PO TABS: 10.0000 mg | ORAL_TABLET | Freq: Every day | ORAL | Status: DC

## 2010-06-19 MED ORDER — HYDROCODONE-ACETAMINOPHEN 5-500 MG PO TABS: 1.0000 | ORAL_TABLET | Freq: Two times a day (BID) | ORAL | Status: DC | PRN

## 2010-06-19 MED ORDER — ALPRAZOLAM 0.5 MG PO TABS: ORAL_TABLET | ORAL | Status: DC

## 2010-06-19 MED ORDER — CITALOPRAM HYDROBROMIDE 40 MG PO TABS: 40.0000 mg | ORAL_TABLET | Freq: Every day | ORAL | Status: DC

## 2010-06-19 MED ORDER — PROMETHAZINE HCL 12.5 MG PO TABS: 12.5000 mg | ORAL_TABLET | Freq: Four times a day (QID) | ORAL | Status: DC | PRN

## 2010-06-19 NOTE — Assessment & Plan Note (Signed)
Fasting lipid panel checked 4 months ago, showed HDL of 85 and an LDL of 155, patient desires to continue was recommended diet in order to control her cholesterol level. Will not initiate medication at this time but I have discussed this with patient in future visit cholesterol and LDL increased she may need to be started on statin.

## 2010-06-19 NOTE — Assessment & Plan Note (Signed)
Detailed MRI of the progress of the cervical and lumbar spine are noted in past medical history section. Patient does have objective evidence of pain on physical exam. Vicodin helps with pain control and I agree with continuation prescribing the medication for that purpose. Pain contract was signed today and I have discussed with patient implications of dilating the contract. Patient is approved to get 60 tablets per month of Vicodin and will take 1-2 tablets per day as needed for pain.

## 2010-06-19 NOTE — Patient Instructions (Signed)
Please schedule follow up appointment in 3 months

## 2010-06-19 NOTE — Assessment & Plan Note (Signed)
Patient was just recently started on higher dose of Celexa dose was increased from 20 mg daily to 40 mg daily. I explained to her that it is expected that this medication will not have significant effect anywhere from 4-6 weeks of its initiation. I recommended we continue Celexa 40 mg once daily dosing and allow another one to 2 months to see any effects. Patient verbalized understanding and also reported that if she does experience suicidal ideations she will call us or go to the emergency department

## 2010-06-19 NOTE — Progress Notes (Signed)
  Subjective:    Patient ID: Sherri Mcmillan, female    DOB: 10/04/1931, 77 y.o.   MRN: [id number]  HPI   patient is a 77 year old female with past medical history outlined below who presents to clinic for regular follow up. She needs refills on her medications specifically pain medication and anxiety medication. She reports doing her own but still has difficulty dealing with her  brother's illness. Last visit she was given higher dose of antidepressant medication and reports not noticing any significant changes in her depression. She denies any systemic symptoms of fever, chills, night sweats, weight loss, mood changes. Also denies suicidal ideations. She continues to have difficulty sleeping at night and tends to go to bed late in the evening. She denies recent illnesses or hospitalizations. Also denies episodes of chest pain, abdominal or urinary concerns, blood in urine or stool.  Review of Systems  Constitutional: Negative.   HENT: Negative.   Eyes: Negative.   Respiratory: Negative.   Cardiovascular: Negative.   Genitourinary: Negative.   Musculoskeletal: Positive for back pain.  Psychiatric/Behavioral: Positive for sleep disturbance. Negative for suicidal ideas, hallucinations, behavioral problems, confusion, dysphoric mood, decreased concentration and agitation. The patient is nervous/anxious.        Objective:   Physical Exam  Constitutional: She is oriented to person, place, and time. She appears well-developed and well-nourished. No distress.  HENT:  Head: Normocephalic and atraumatic.  Right Ear: External ear normal.  Left Ear: External ear normal.  Nose: Nose normal.  Mouth/Throat: Oropharynx is clear and moist.  Neck: Normal range of motion. Neck supple. No JVD present. No tracheal deviation present. No thyromegaly present.  Cardiovascular: Normal rate, regular rhythm, normal heart sounds and intact distal pulses.  Exam reveals no gallop and no friction rub.   No murmur  heard. Pulmonary/Chest: Effort normal and breath sounds normal. No respiratory distress. She has no wheezes. She has no rales. She exhibits no tenderness.  Abdominal: Soft. Bowel sounds are normal. She exhibits no distension and no mass. There is no tenderness. There is no rebound and no guarding.  Musculoskeletal: She exhibits no edema and no tenderness.  Neurological: She is alert and oriented to person, place, and time. She has normal reflexes.  Skin: Skin is warm and dry. No rash noted. She is not diaphoretic. No erythema. No pallor.  Psychiatric: She has a normal mood and affect. Her behavior is normal. Judgment and thought content normal.          Assessment & Plan:

## 2010-06-19 NOTE — Assessment & Plan Note (Signed)
Blood pressure is at goal. Patient does report not taking Lasix and amoxapine over the past week. We have discussed potential risk of medical noncompliance. I have also discussed with patient importance of blood pressure control. We will provide refill on the medications. Last electrolyte panel was checked November 2011 and in the numbers were within normal limits

## 2010-06-29 ENCOUNTER — Encounter: Payer: Self-pay | Admitting: Internal Medicine

## 2010-06-29 DIAGNOSIS — I350 Nonrheumatic aortic (valve) stenosis: Secondary | ICD-10-CM | POA: Insufficient documentation

## 2010-06-29 DIAGNOSIS — F419 Anxiety disorder, unspecified: Secondary | ICD-10-CM | POA: Insufficient documentation

## 2010-06-29 DIAGNOSIS — E785 Hyperlipidemia, unspecified: Secondary | ICD-10-CM | POA: Insufficient documentation

## 2010-06-29 DIAGNOSIS — I10 Essential (primary) hypertension: Secondary | ICD-10-CM | POA: Insufficient documentation

## 2010-06-29 DIAGNOSIS — F329 Major depressive disorder, single episode, unspecified: Secondary | ICD-10-CM

## 2010-06-29 DIAGNOSIS — G8929 Other chronic pain: Secondary | ICD-10-CM | POA: Insufficient documentation

## 2010-06-30 ENCOUNTER — Ambulatory Visit (INDEPENDENT_AMBULATORY_CARE_PROVIDER_SITE_OTHER): Payer: MEDICARE | Admitting: Internal Medicine

## 2010-06-30 ENCOUNTER — Encounter: Payer: Self-pay | Admitting: Internal Medicine

## 2010-06-30 VITALS — BP 106/57 | HR 56 | Temp 96.9°F | Ht 60.0 in | Wt 125.9 lb

## 2010-06-30 DIAGNOSIS — G8929 Other chronic pain: Secondary | ICD-10-CM

## 2010-06-30 DIAGNOSIS — F411 Generalized anxiety disorder: Secondary | ICD-10-CM

## 2010-06-30 DIAGNOSIS — F419 Anxiety disorder, unspecified: Secondary | ICD-10-CM

## 2010-06-30 DIAGNOSIS — I350 Nonrheumatic aortic (valve) stenosis: Secondary | ICD-10-CM

## 2010-06-30 DIAGNOSIS — I10 Essential (primary) hypertension: Secondary | ICD-10-CM

## 2010-06-30 DIAGNOSIS — I359 Nonrheumatic aortic valve disorder, unspecified: Secondary | ICD-10-CM

## 2010-06-30 DIAGNOSIS — E7800 Pure hypercholesterolemia, unspecified: Secondary | ICD-10-CM

## 2010-06-30 DIAGNOSIS — I872 Venous insufficiency (chronic) (peripheral): Secondary | ICD-10-CM

## 2010-06-30 MED ORDER — OXYCODONE-ACETAMINOPHEN 5-325 MG PO TABS: 1.0000 | ORAL_TABLET | ORAL | Status: DC | PRN

## 2010-06-30 NOTE — Assessment & Plan Note (Addendum)
Sherri Mcmillan has had pain on her right upper extremity for 6 months that is increasing in severity and frequency.  The pain is from her neck down to her hand. It hurts worse in the afternoon and she is not able to lay on that side at night. Movement of the right upper extremity makes it worse. She is a right-handed and has noted some weakness in that upper extremity because of the pain. She has occasionally dropped things out of the hand and she covers by saying she is clumsy. She has tried a total of 10 mg of hydrocodone at a time that did not touch the pain. She then tried a third hydrocodone and also did not receive any pain relief. She uses a water bottle in the evening to help her sleep. She also uses sedating medicines in addition to the narcotic to help her get to sleep. She did not have any injury that caused the increase in her pain.   She had an MRI of the cervical and lumbar spine in 2008 that showed multilevel degenerative changes. Her pain has significantly increased since the 2008 MRI.   I don't think that her pain is cervical in nature from a pinched nerve I. She does not seem to have much radicular symptoms. I am thinking that this is secondary to her rotator cuff.  She states she has not seen anyone for her upper extremity pain. I discussed her options and we decided to try oxycodone for temporary relief of her pain. I have also referred her to sports medicine to get further evaluation and treatment.

## 2010-06-30 NOTE — Progress Notes (Signed)
  Subjective:    Patient ID: Sherri Mcmillan, female    DOB: 10/04/1931, 77 y.o.   MRN: [id number]  HPI  Please see the A&P for the status of the pt's chronic medical problems.   Review of Systems  Constitutional: Negative for activity change and appetite change.  HENT: Positive for neck pain. Negative for hearing loss and neck stiffness.   Eyes: Negative for visual disturbance.  Respiratory: Negative for shortness of breath.   Cardiovascular: Negative for chest pain.  Gastrointestinal: Positive for constipation. Negative for abdominal pain and diarrhea.  Genitourinary: Positive for frequency. Negative for dysuria.  Musculoskeletal: Positive for arthralgias. Negative for myalgias, back pain, joint swelling and gait problem.  Neurological: Positive for headaches. Negative for dizziness, weakness, light-headedness and numbness.  Psychiatric/Behavioral: Positive for sleep disturbance. Negative for behavioral problems and dysphoric mood. The patient is not nervous/anxious.        Objective:   Physical Exam  Constitutional: She is oriented to person, place, and time. She appears well-developed and well-nourished. No distress.  HENT:  Head: Normocephalic and atraumatic.  Right Ear: External ear normal.  Left Ear: External ear normal.  Eyes: Conjunctivae and EOM are normal.  Neck: Carotid bruit is not present.  Cardiovascular: Normal rate and regular rhythm.   Murmur heard.  Systolic murmur is present with a grade of 3/6        No carotid bruits bilaterally  Musculoskeletal:       No tenderness over cervical spine. + tenderness to palpation diffusely over the right shoulder.  Decreased range of motion of the right shoulder. Range of motion active and passive produces pain. Muscle strength on the right limited by pain. Muscle strength on the left normal. No muscular atrophy on the right upper extremity.  Neurological: She is alert and oriented to person, place, and time.  Skin: Skin is  warm and dry. She is not diaphoretic.        There are varicose veins on the left calf that are palpable.  Psychiatric: She has a normal mood and affect. Her behavior is normal. Judgment and thought content normal.          Assessment & Plan:

## 2010-06-30 NOTE — Patient Instructions (Addendum)
I am sending you to sports medicine to help with your right shoulder. Try the new pain medicine. Call me if you need a refill

## 2010-06-30 NOTE — Assessment & Plan Note (Signed)
Sherri Mcmillan would have to wait to repeat her echocardiogram until the summertime.

## 2010-06-30 NOTE — Assessment & Plan Note (Signed)
  BP Readings from Last 3 Encounters:  06/30/10 106/57  06/19/10 116/67  05/25/10 131/73    Sherri Mcmillan's blood pressure readings have been excellent over the past couple months. She is being maintained on Norvasc and Lasix. Her basic metabolic panel was checked 5 months ago and she will need this repeated at her next office visit along with a fasting lipid panel.

## 2010-06-30 NOTE — Assessment & Plan Note (Signed)
Sherri Mcmillan has painful for her possibilities on her left lower extremity. The Vicodin is not helping her pain. There are palpable varicosities on the left calf. Will wait until her shoulder is improved before tackling in her varicose veins.

## 2010-06-30 NOTE — Assessment & Plan Note (Signed)
Her LDL is elevated up however her goal. Rather than starting a statin since she has no diabetes and no coronary artery disease I am going to elect to repeat fasting lipid panel in a few months and see if her LDL remains elevated. As I documented in the overview her LDL goal is between 100 and 130.

## 2010-06-30 NOTE — Assessment & Plan Note (Addendum)
Ms. Eckmann has had significant improvement in her symptoms of chronic anxiety, depression, and grief reaction. She is no longer tearful and highly excitable. She states that she is doing much better on the Celexa. She states that her brother who is her only living relative has been hospitalized because of prostate problems in that she was able to handle the stress of going to the hospital daily to see him much better than she had been handling stress. 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

## 2010-07-01 ENCOUNTER — Ambulatory Visit (HOSPITAL_COMMUNITY): Payer: Self-pay | Admitting: Psychiatry

## 2010-07-03 ENCOUNTER — Ambulatory Visit: Payer: MEDICARE | Admitting: Family Medicine

## 2010-07-16 ENCOUNTER — Ambulatory Visit (INDEPENDENT_AMBULATORY_CARE_PROVIDER_SITE_OTHER): Payer: MEDICARE | Admitting: Family Medicine

## 2010-07-16 ENCOUNTER — Encounter: Payer: Self-pay | Admitting: Family Medicine

## 2010-07-16 VITALS — BP 110/60 | Ht 60.0 in | Wt 115.0 lb

## 2010-07-16 DIAGNOSIS — M75100 Unspecified rotator cuff tear or rupture of unspecified shoulder, not specified as traumatic: Secondary | ICD-10-CM

## 2010-07-16 DIAGNOSIS — M719 Bursopathy, unspecified: Secondary | ICD-10-CM

## 2010-07-17 NOTE — Progress Notes (Signed)
  Subjective:    Patient ID: Sherri Mcmillan, female    DOB: 10/04/1931, 77 y.o.   MRN: [id number]  HPI Right shoulder pain for about 4 months. No specific injury. Right hand dominant. No prior shoulder problems and no prior surgery to right shoulder. Pain worse with certain motions---especially overhead motions.No numbness or tingling in arm or hand. Hurts when she tries to sleep on that side and feels stiff in am, stiffness gets some better.   Review of Systems Pertinent review of systems: negative for fever or unusual weight change.     Objective:   Physical Exam  Shoulder without obvious defect. Full strength in:              Internal rotation              External rotation               Supraspinatus testing Distally neurovasculalry intact Impingement signs are positive'  INJECTION: Patient was given informed consent, signed copy in the chart. Appropriate time out was taken. Area prepped and draped in usual sterile fashion. A 1  cc kennalog 40 mg/ml plus  4 cc of lidocaine was injected into the right subacromial space  using a(n) posterior approach. The patient tolerated the procedure well. There were no complications. Post procedure instructions were given.      Assessment & Plan:

## 2010-07-21 ENCOUNTER — Ambulatory Visit: Payer: MEDICARE | Admitting: Family Medicine

## 2010-07-22 ENCOUNTER — Telehealth: Payer: Self-pay | Admitting: *Deleted

## 2010-07-22 MED ORDER — OMEPRAZOLE 20 MG PO CPDR: 20.0000 mg | DELAYED_RELEASE_CAPSULE | Freq: Every day | ORAL | Status: DC

## 2010-07-22 NOTE — Telephone Encounter (Signed)
Pt called stating having reflux at night.  She states she chokes at night and has fluid coming out her nose.  C/o burning and choking. This is scaring her.  She sleeps on 3 pillows and it's not helping. Can you give a med to help with this?  Pt # [phone] May leave message on machine

## 2010-07-22 NOTE — Telephone Encounter (Signed)
I Rx'd omeprazole. She can try it about an hour before bed. Decrease fatty, spicy food, decrease caffeine intake, decrease food intake for several hours before bed. She can continue to take the ranitidine. If no better after one week, needs appt.

## 2010-07-22 NOTE — Telephone Encounter (Signed)
Pt informed and voices understanding 

## 2010-08-03 ENCOUNTER — Ambulatory Visit (INDEPENDENT_AMBULATORY_CARE_PROVIDER_SITE_OTHER): Payer: MEDICARE | Admitting: Internal Medicine

## 2010-08-03 ENCOUNTER — Encounter: Payer: Self-pay | Admitting: Internal Medicine

## 2010-08-03 DIAGNOSIS — K5900 Constipation, unspecified: Secondary | ICD-10-CM

## 2010-08-03 DIAGNOSIS — K219 Gastro-esophageal reflux disease without esophagitis: Secondary | ICD-10-CM

## 2010-08-03 DIAGNOSIS — I872 Venous insufficiency (chronic) (peripheral): Secondary | ICD-10-CM

## 2010-08-03 DIAGNOSIS — I10 Essential (primary) hypertension: Secondary | ICD-10-CM

## 2010-08-03 MED ORDER — OMEPRAZOLE 40 MG PO CPDR: 40.0000 mg | DELAYED_RELEASE_CAPSULE | Freq: Every day | ORAL | Status: DC

## 2010-08-03 MED ORDER — LACTULOSE 10 G PO PACK: 10.0000 g | PACK | Freq: Two times a day (BID) | ORAL | Status: AC

## 2010-08-03 MED ORDER — SUCRALFATE 1 GM/10ML PO SUSP: 1.0000 g | Freq: Four times a day (QID) | ORAL | Status: DC

## 2010-08-03 NOTE — Progress Notes (Addendum)
  Subjective:    Patient ID: Sherri Mcmillan, female    DOB: 10/04/1931, 77 y.o.   MRN: [id number]  HPI 77 yr old woman with history of: --worsening reflux symptoms about 10 days ago (noticed with recumbancy, bending over in particular) eats a diet with quite a bit of fried food-trying to cut back on that --For reflux pain, began taking more Vicodin prn, and about 5 days ago developed --constipation-has tried a variety of OTC meds, which has aggravated her nauseau --taking NSADIS for her pain (Naproxen 200 mg) as well, further aggravating her GERD   Review of Systems No melena,hematamesis, orthostatic sx's She notes ongoing pain in the right knee and (from varicosities) left calf    Objective:   Physical Exam Looks somewhat frail, NAD, alert HEENT: OP clear, no lesions Neck supple, FROM, no LAD or Bruits, no TM Lungs CTA Abd; Soft, slightly TTP in epigastric region, NABS Rectal: normal tone, soft stool in vault, no impaction, stool guiac negative Ext No CCE       Assessment & Plan:   Anti-reflux measures reviewed Increase omeprazole to 40 mg qd Add sulcralfate as slurry 1 gr qid for 10 days (no rf) FU in 4 weeks-if GERD sx's not resolved consider endoscopy

## 2010-08-18 ENCOUNTER — Telehealth: Payer: Self-pay | Admitting: *Deleted

## 2010-08-18 ENCOUNTER — Ambulatory Visit (INDEPENDENT_AMBULATORY_CARE_PROVIDER_SITE_OTHER): Payer: MEDICARE | Admitting: Internal Medicine

## 2010-08-18 ENCOUNTER — Encounter: Payer: Self-pay | Admitting: Internal Medicine

## 2010-08-18 VITALS — BP 126/65 | HR 61 | Temp 96.7°F | Ht 61.5 in | Wt 128.0 lb

## 2010-08-18 DIAGNOSIS — Z0000 Encounter for general adult medical examination without abnormal findings: Secondary | ICD-10-CM | POA: Insufficient documentation

## 2010-08-18 DIAGNOSIS — E7800 Pure hypercholesterolemia, unspecified: Secondary | ICD-10-CM

## 2010-08-18 DIAGNOSIS — G8929 Other chronic pain: Secondary | ICD-10-CM

## 2010-08-18 DIAGNOSIS — F411 Generalized anxiety disorder: Secondary | ICD-10-CM

## 2010-08-18 DIAGNOSIS — K219 Gastro-esophageal reflux disease without esophagitis: Secondary | ICD-10-CM

## 2010-08-18 DIAGNOSIS — F419 Anxiety disorder, unspecified: Secondary | ICD-10-CM

## 2010-08-18 DIAGNOSIS — I10 Essential (primary) hypertension: Secondary | ICD-10-CM

## 2010-08-18 NOTE — Assessment & Plan Note (Addendum)
[name] complains of headaches, pain in her back and her leg today. It is rather challenging to get a coherent history out of [name]. I was unable to get the frequency of her headaches. She states she has had this current headache for 3 days. She had a bottle of oxycodone and stated she did not like to take it because it gave her nausea and vomiting. I took her bottle from her and passed on to Sherri Mcmillan to dispose of according to our clinic policy. She is a that the hydrocodone works but that she needed a higher dosage. She currently is on 5 mg. She states that when she gets in so much pain she "go crazy". I was unable to get her to expand on what she meant by go crazy.  When she saw Dr. Reche Mcmillan she had taken so much narcotic that she was constipated and was requiring MiraLax. I asked her to take the MiraLax as needed and hopefully she will not need it on a daily basis now that I have limited her to 2 hydrocodone a week.  I have rather significant concerns about maintaining [name] uncontrolled substances. Back when she had her severe grief reaction I don't think she was able to use these medicines properly. Her acute grief reaction is now over that she still has a personality that is almost hyper or manic. She has not agreed to see psychiatry or a therapist even during her severe grief reaction. She was taking rather large quantities a few months back without any significant improvement in her pain. I did not give her a refill today. It would be really nice to get her back in and have a visit dedicated to just pain.

## 2010-08-18 NOTE — Telephone Encounter (Signed)
CALLED PATIENT AND LEFT MESSAGE OF VOICE MACHINE FOR HER TO CALL ME AT OP / CONCERNING HER GI APPT WITH [facility]. LELA STURDIVANT NTII

## 2010-08-18 NOTE — Assessment & Plan Note (Addendum)
Still with N & V. Meds (carafate and PPI) not helping. No weight loss. Heme negative at last visit. Sxs for over month. No colon cancer in family. Has never had EGD.   Diff Dx : gastroparesis (not diabetic, no autoimmune diseases, no other etiologies for gastroparesis), obstruction (cancer, stricture), HH, bad GERD, gallbladder disease, pancreatitis...  Will start with a referral to gastroenterology to be evaluated for the possibility of an EGD. She will continue her proton pump inhibitor but I am not refilling the Carafate at this time.

## 2010-08-18 NOTE — Assessment & Plan Note (Signed)
Her blood pressure is excellent today. I am checking a basic metabolic panel today as she is on Lasix.

## 2010-08-18 NOTE — Progress Notes (Signed)
  Subjective:    Patient ID: Sherri Mcmillan, female    DOB: 10/04/1931, 77 y.o.   MRN: [id number]  HPI    Review of Systems  Constitutional: Negative for appetite change.  HENT: Positive for congestion and rhinorrhea. Negative for sore throat.   Eyes: Positive for discharge.  Respiratory: Positive for cough.   Cardiovascular: Positive for leg swelling.  Gastrointestinal: Positive for nausea and vomiting. Negative for abdominal pain, diarrhea, constipation and blood in stool.  Genitourinary: Negative for dysuria, frequency, hematuria and difficulty urinating.  Musculoskeletal: Negative for joint swelling and arthralgias.  Neurological: Positive for headaches.  Psychiatric/Behavioral: Negative for confusion and sleep disturbance. The patient is nervous/anxious.        Objective:   Physical Exam  Constitutional: She is oriented to person, place, and time. She appears well-developed and well-nourished. No distress.  HENT:  Head: Normocephalic and atraumatic.  Right Ear: External ear normal.  Left Ear: External ear normal.  Nose: Nose normal.  Eyes: Conjunctivae are normal. Pupils are equal, round, and reactive to light.  Neck: No tracheal deviation present. No thyromegaly present.  Cardiovascular: Normal rate, regular rhythm and normal heart sounds.   Pulmonary/Chest: Effort normal and breath sounds normal.  Musculoskeletal: She exhibits edema and tenderness.  Neurological: She is alert and oriented to person, place, and time.  Skin: She is not diaphoretic.  Psychiatric: Her mood appears anxious. Her affect is not angry, not blunt, not labile and not inappropriate. Her speech is rapid and/or pressured. Her speech is not delayed, not tangential and not slurred. She is agitated and is hyperactive. She is not aggressive, not slowed, not withdrawn, not actively hallucinating and not combative. Thought content is not paranoid and not delusional. Cognition and memory are not impaired. She  does not express impulsivity or inappropriate judgment. She does not exhibit a depressed mood. She is communicative. She exhibits normal recent memory and normal remote memory. She is attentive.          Assessment & Plan:

## 2010-08-18 NOTE — Assessment & Plan Note (Signed)
I do not have the chance today to talk to Sherri Mcmillan about her cholesterol. My prior plan was to recheck her fasting lipid panel and if her LDL remains elevated to start a statin at that time. I will need to check a fasting lipid panel at a future visit.

## 2010-08-18 NOTE — Patient Instructions (Signed)
Please only take hydrocodone two pills in a week.  Take the miralax as needed.  Please come to see me in 4-6 weeks.

## 2010-08-18 NOTE — Assessment & Plan Note (Signed)
See the assessment and plan for chronic pain. I do not have the time to go into her anxiety and depth. I did not give her a refill on her alprazolam today.

## 2010-08-19 ENCOUNTER — Telehealth: Payer: Self-pay | Admitting: *Deleted

## 2010-08-19 ENCOUNTER — Other Ambulatory Visit: Payer: Self-pay | Admitting: *Deleted

## 2010-08-19 DIAGNOSIS — R440 Auditory hallucinations: Secondary | ICD-10-CM | POA: Diagnosis present

## 2010-08-19 DIAGNOSIS — G8929 Other chronic pain: Secondary | ICD-10-CM

## 2010-08-19 DIAGNOSIS — F419 Anxiety disorder, unspecified: Secondary | ICD-10-CM

## 2010-08-19 HISTORY — DX: Auditory hallucinations: R44.0

## 2010-08-19 LAB — CBC WITH DIFFERENTIAL/PLATELET
Basophils Absolute: 0 10*3/uL (ref 0.0–0.1)
Basophils Relative: 0 % (ref 0–1)
Eosinophils Absolute: 0.1 10*3/uL (ref 0.0–0.7)
Eosinophils Relative: 1 % (ref 0–5)
Hemoglobin: 12.5 g/dL (ref 12.0–15.0)
Lymphocytes Relative: 34 % (ref 12–46)
MCH: 29.1 pg (ref 26.0–34.0)
MCHC: 32.7 g/dL (ref 30.0–36.0)
MCV: 89 fL (ref 78.0–100.0)
Monocytes Relative: 9 % (ref 3–12)
Platelets: 210 10*3/uL (ref 150–400)
RDW: 13.5 % (ref 11.5–15.5)
WBC: 4 10*3/uL (ref 4.0–10.5)

## 2010-08-19 LAB — COMPLETE METABOLIC PANEL WITH GFR
ALT: 13 U/L (ref 0–35)
AST: 17 U/L (ref 0–37)
Albumin: 4.7 g/dL (ref 3.5–5.2)
BUN: 20 mg/dL (ref 6–23)
CO2: 26 mEq/L (ref 19–32)
Calcium: 9.6 mg/dL (ref 8.4–10.5)
Chloride: 104 mEq/L (ref 96–112)
Creat: 1.03 mg/dL (ref 0.40–1.20)
GFR, Est Non African American: 52 mL/min — ABNORMAL LOW (ref >60)
Glucose, Bld: 96 mg/dL (ref 70–99)
Potassium: 3.9 mEq/L (ref 3.5–5.3)
Total Protein: 6.9 g/dL (ref 6.0–8.3)

## 2010-08-19 MED ORDER — HYDROCODONE-ACETAMINOPHEN 5-500 MG PO TABS: 1.0000 | ORAL_TABLET | Freq: Two times a day (BID) | ORAL | Status: DC | PRN

## 2010-08-19 NOTE — Telephone Encounter (Signed)
Pt called Sherri Mcmillan re: her pain meds and stated sometimes she has demons that speak to her and sometimes she will not even let her daughter come in because the demons tell her not to open the door. i called dr Midwife while Sherri Mcmillan had pt on the ph and she states that pt has told her about the demons before and pt has been hospitalized in the '90's for this, she has asked pt to see mental health for this condition but pt has refused. Dr Midwife now ask for donnat. To possibly become involved and may be able to assist pt

## 2010-08-19 NOTE — Telephone Encounter (Signed)
Thank you. My plan is in the other phone note from today.

## 2010-08-19 NOTE — Telephone Encounter (Signed)
I did not give this yesterday bc I must have looked at the med list incorrectly bc I was under the impression that she had been given refills. She gopt #60 on 2/24 (about 60 days ago) with no refills. Dr Aldine Contes got contract but I cannot locate in EPIC. Will refill for #60 but as I discussed at the visit, it is to be used sparingly (I suggested to her 2 per week).

## 2010-08-19 NOTE — Telephone Encounter (Signed)
Agree with note. I saw pt 24 hours ago and no visual or auditory hallucinations. I agree with Lupita Leash T referral. Additionally, I will refuse refills on controlled substances unless she complies with mental health referral and / or Lupita Leash T's rec's. She is now far enough out from the loss of her sister that I can no longer say that this is an extreme grief rxn. It has to be something more pervasive/organic.

## 2010-08-19 NOTE — Progress Notes (Signed)
Quick Note:  C/O daily N & V. Labs nl. Has been referred to GI. ______

## 2010-08-19 NOTE — Telephone Encounter (Signed)
Vicodin rx called to [facility]. 

## 2010-08-19 NOTE — Telephone Encounter (Signed)
Addendum: when pt called earlier this morning about her pain medication, so stated she was going to take "some Morphine pills"  b/c sh did not have Vicodin. Pt was instructed not to take them. I also called her daughter,Marian, who had already talked to her mother and told her not to take the Morphine.  The daughter said she was on way to her mother's house. Ms. Teagarden was very upset/crying/hearing "a demon". Follow-up call:  I called the pt who stated she had taken her Vicodin and was going to lie down to take a nap. Pt is much calmer. I also called her daughter [phone]) who stated she flushed the Morphine tablets down the toilet.  And stated her mother doing better.

## 2010-08-19 NOTE — Telephone Encounter (Signed)
Pt calling for a refill.  I do not see where one was entered at yesterday's visit.

## 2010-08-20 ENCOUNTER — Telehealth: Payer: Self-pay | Admitting: Licensed Clinical Social Worker

## 2010-08-20 NOTE — Telephone Encounter (Signed)
Spoke with daughter Sheran Lawless to let her know that we were very concerned about her Mother especially with the auditory hallucinations and recent events. Sheran Lawless said that her sister's death and brother's health problems have taken its toll out on her mother.   I gave Sheran Lawless the phone number for [facility] and told her they will not have appmts until July 2012.  I encouraged her to make that appmt anyway because psychiatry everywhere is backed up.  In the meantime, daughter said she would connect mother with a therapist.  Sheran Lawless said that she knows Dr. Beaulah Dinning a licensed psychologist who will see her mother in the meantime.   I also gave Sheran Lawless the phone number for the crisis line for [facility] in case there is a crisis.  Sheran Lawless was receptive to our help and said she would follow through in getting mother help.

## 2010-09-08 NOTE — Discharge Summary (Signed)
Sherri Mcmillan, Sherri Mcmillan NO.:  [id number]   MEDICAL RECORD NO.:  [id number]          PATIENT TYPE:  INP   LOCATION:  5028                         FACILITY:  [facility]   PHYSICIAN:  Zara Council, MD      DATE OF BIRTH:  1932-09-26   DATE OF ADMISSION:  12/10/2006  DATE OF DISCHARGE:  12/21/2006                               DISCHARGE SUMMARY   PATIENT'S PRIMARY CARE PHYSICIAN:  Madaline Guthrie, M.D.   DISCHARGE DIAGNOSES:  1. Back pain secondary to degenerative disease.  2. Upper and lower GI bleeding.  3. Hypertension.  4. Systolic murmur.  5. Insomnia.   DISCHARGE MEDICATIONS:  1. MS Contin 60 mg 1 tablet twice a day.  2. Morphine sulfate immediate release 15 mg 1 tablet up to 4 times a      day as required for pain.  3. Lasix 40 mg 1 tablet a day.  4. Norvasc 10 mg 1 tablet a day.  5. Hydroxyzine 25 mg 1 tablet 3 times a day as required for nerves.  6. Ambien  5 mg 1 tablet before bed.  7. Dulcolax 10 mg 1 tab daily.  8. Colace 100 mg 1 tablet 2 times a day.  9. Protonix 40 mg 1 tablet a day.  10.Flexeril 5 mg 1 tablet 3 times a day.  11.Phenergan 12.5 to 25 mg 1 tablet every 4-6 hours for nausea.   DISPOSITION AND FOLLOWUP:  Patient is sent home in a stable condition.  She is to follow up with Dr.  Liliane Channel at [facility] on January 04, 2007, at 1:30 p.m. in the afternoon.  At the  followup visit, she is to be reviewed for age appropriate cancer worked  up (for possible spinal metastasis as her MRI had shown some suspicious  lesions at the T6 level).  We also recommend repeat hemoglobin  examination to see how she is doing with her problem of upper and lower  gastrointestinal bleeding that is secondary to duodenitis and external  hemorrhoids.  If the hemoglobin is low, we recommend further  investigation and starting her on iron supplements.   PROCEDURES:  Patient underwent thoracic spine x-ray examination on  December 10, 2006,  and the findings were as follows:   1. Imaging of the lower cervical spine on lateral view shows      degenerative changes and a vertebral body height loss of 2 levels      thought to be C5 and C6.  These findings appeared to be stable when      compared to a lateral view from  2006.  Consider dedicated cervical      spine imaging depending on the clinical symptoms.  2. Osteopenia.   Patient also had diagnostic lumbar spine examination and findings were  as follows:   1. Mild degenerative changes of the lumbar spine.  2. No evidence for fracture.   Patient underwent an MRI examination of the thoracic spine with and  without contrast and the findings were as follows:  1. A 4-5 mm intradural extramedullary enhancing focus dorsal  of the      spinal cord at T6, which is nonspecific and could reflect a small      meningioma, nerve sheath tumor or less likely metastasis.  2. Acute or subacute small nodules of the inferior T6 endplate.  3. Multilevel cervical degenerative changes.   An MRI lumbar spine done at the same setting had the following findings:  1. Multilevel disc changes degeneration most severe at L3-4 with      edematous endplate changes.  Multilevel annular tears.  2. Neural foraminal narrowing most pronounced at L5 on the right.  No      central spinal stenosis.   Patient underwent acute abdominal series x-ray on December 16, 2006, which  showed nonspecific bowel gas pattern and otherwise, normal reading with  no interparietal free air, no gaseous, bowel dilation due to obstruction  and no unexpected abdominal or pelvic calcifications.   Patient underwent chest 2-view radiographic examination on December 18, 2006, and the findings were left lower lobe retrocardiac atelectasis  versus scarring and small left effusion.   Patient underwent an echocardiographic examination on December 13, 2006,  and the findings are as follows:  Overall left ventricular systolic  function was  vigorous, left ventricular ejection fraction was estimated  to between 65-75%.  There was no diagnostic evidence of left ventricular  regional wall motion abnormalities.  Left ventricular wall thickness was  at the upper limits of normal.  Left ventricular diastolic function  diameters were normal.  Aortic wall thickness was mildly to moderately  increased.  Findings were consistent with mild aortic valve stenosis.  There was severe aortic valvular regurgitation .  The mean aortic valve  peak gradient was 40 mmHg.  Estimated aortic valve area was 1.37 cm2.  Estimated aortic valve area by Remax was 1.48 cm2.   CONSULTATIONS:  Patient was consulted with Dr. Jeani Hawking for  hematochezia and hematemesis that developed while she was in the  hospital.  Given the acuteness  of her symptoms, Dr. Elnoria Howard wanted to  evaluate on an emergent basis and performed an EGD and a flexible  sigmoidoscopic examination on December 15, 2006.  The findings were as  follows: hiatal hernia, duodenitis.  No overt evidence of a Mallory  Weiss tear, but it appeared that the area was irritated from her  vomiting.   Sigmoidoscopic examination results were as follows:  Sigmoid region:  Brownish stool encountered.  Rectum:  External hemorrhoids.  A mildly  irritated area in the patient's external hemorrhoids is most likely the  site of bleeding.   ADMISSION HISTORY AND PHYSICAL:  Patient presented with 1-day history of  back pain.  She woke up on the day of the presentation to hospital with  severe 10/10 sharp back pain.  It had started from the right scapular  region and progressed downward.  She took Tylenol #3, which gave her  no  relief.  Pain remained more or less persistent, so she decided to come  to ED.  Pain is aggravated by movement.  There was no relieving factors.  No history of trauma recently.  She had been having low back pain of  lesser severity for about 18 years ever since she sustained a trauma to   her back, but previously, she used to have relief with Tylenol #3.  She  denied any other systemic complaints including weight loss, problems  with bowel or bladder, incontinence, or numbness.   On past medical history, she had hypertension,  degenerative back  condition, insomnia, and vocal cord polyps, for which she is seeing Dr.  Ezzard Standing.  She also mentioned hysterectomy and venous insufficiency.   On examination, she had a temperature of 97.1, blood pressure 160/84,  pulse 62, oxygen saturation 98% on room air.  GENERAL EXAMINATION:  Patient was in significant distress because of  back pain.  HEENT:  Extraocular muscles intact.  Pupils equal round, reactive to  light and accommodation.  No pallor or icterus.  Throat was moist.  NECK EXAMINATION:  No lymphadenopathy.  No thyromegaly.  RESPIRATORY:  Clear to auscultation, both sides.  CARDIOVASCULAR:  Regular rate and rhythm.  Systolic parasternal murmur  of 2/6 severity.  No rubs or gallops.  Normal first and second heart  sounds.  GI:  Soft, nontender, nondistended.  Positive bowel sounds.  No masses.  EXTREMITIES:  No edema.  SKIN:  No rash.  LYMPH:  No lymphadenopathy.  MUSCULOSKELETAL:  Tender over lumbar spine, but there was no muscle  spasm.  SLRT 10 degrees on the right side and 70 degrees on the left.  NEURO:  Alert and oriented x3.  Cranial nerves II-XII intact.  No motor  or sensory deficit.  Babinski downgoing bilaterally, but there was  stiffness to flexion on lower limbs on both sides.  Psychiatric examination was appropriate.  Labs:  White cells 10.8, hemoglobin 12.3, platelet 194, ANC 1.8, MCV  84.1, sodium 136, potassium 4.2, chloride 106, bicarbonate 24.7, BUN 17.   HOSPITAL COURSE:  1. Patient presented with significant degree of back pain in the      background of having a long history of degenerative back condition.      We kept her on narcotic analgesics and muscle relaxants and      performed a series of  radiographic examinations including MRI      examination of her thoracic and lumbar spinal regions.  The      findings are mentioned as above.  Patient responded well to      conservative measures and we enlisted PT to help her move about.      By the day of discharge, she was able to move out of the chair      without assistance and she has been having good control of her pain      with narcotic analgesics we prescribed.  There was some concern      regarding whether the nodules at the T6 level could be a malignant      lesion by itself or metastatic lesion.  An age-appropriate cancer      workup is to be done on an outpatient basis.  According to her, she      recently has had mammogram exam.  We need to get the results when      she comes for followup in the clinic.  We discussed the findings on      the MRI with the radiologist and a repeat examination in 3 months      has been advised, which is to be done on an outpatient basis.   1. Upper and lower GI bleeding.  Patient developed some vomiting      followed by bright red blood as well as bright red blood per rectum      while in the hospital.  Her hemoglobin remained stable as well as      her hemodynamic status was stable, but given the acute nature of  the problem, we did GI consult and she was evaluated by Dr. Jeani Hawking, who subsequently performed an EGD and a flexible      sigmoidoscopy.  The findings are as mentioned above.  We started      her on Protonix and she is stable as regards to this problem of      hers on the day of discharge.   1. Hypertension.  Patient remained stable in terms of her blood      pressure during her hospital stay.  We just continued her      medications of Lasix and amlodipine.   1. Systolic murmur.  Patient was found to have a new systolic murmur      on her chest on the day of discharge.  A 2-D echo were performed      and the findings are as mentioned above.   1. Insomnia.  The  patient is known to suffer from nerves and insomnia      for a long time, but she had an uneventful hospital stay in terms      of this particular problem of hers.  We just continued with her      regular medication of temazepam.  She was on regular hydroxyzine      from the outpatient clinic, but on advice of the pharmacist, we      changed her hydroxyzine to p.r.n. as the medication is known to      cause falls in elderly people as per pharmacy.   VITALS FROM THE DAY OF DISCHARGE:  Patient had blood pressure of 114/71,  temperature 98.6, pulse 78, respiratory rate 18, oxygen sat 98% on room  air.   DISCHARGE LABS:  Hemoglobin 11.1, white cell 4.4, platelets 214, sodium  140, potassium 2.9, chloride 100, bicarbonate 31, glucose 95, BUN 5,  creatinine 0.87, and calcium 9.7.      Zara Council, MD  Electronically Signed     AS/MEDQ  D:  12/21/2006  T:  12/22/2006  Job:  [id number]

## 2010-09-08 NOTE — Discharge Summary (Signed)
NAMEMYRTA, MERCER NO.:  [id number]   MEDICAL RECORD NO.:  [id number]          PATIENT TYPE:  INP   LOCATION:  5028                         FACILITY:  [facility]   PHYSICIAN:  Eliseo Gum, M.D.   DATE OF BIRTH:  1933/03/04   DATE OF ADMISSION:  12/10/2006  DATE OF DISCHARGE:  12/21/2006                               DISCHARGE SUMMARY   PRIMARY CARE :  Dr. Beatrice Lecher.   DICTATION ENDS AT THIS POINT.      Zara Council, MD       Eliseo Gum, M.D.     AS/MEDQ  D:  12/21/2006  T:  12/22/2006  Job:  [id number]

## 2010-09-08 NOTE — Op Note (Signed)
Sherri Mcmillan, Sherri Mcmillan               ACCOUNT NO.:  [id number]   MEDICAL RECORD NO.:  [id number]          PATIENT TYPE:  AMB   LOCATION:  DSC                          FACILITY:  [facility]   PHYSICIAN:  Christopher E. 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   OPERATION:  Direct laryngoscopy and excisional biopsy of right anterior  vocal cord lesion.   SURGEON:  Narda Bonds, M.D.   ANESTHESIA:  General endotracheal.   COMPLICATIONS:  None.   CLINICAL NOTE:  Sherri Mcmillan is a 77 year old female who has had  hoarseness now for several months.  She had previous polyps removed by  Dr. Haroldine Laws 5-6 years ago.  She had done well until last several months  when she has had worsening of her voice and persistent hoarseness.  She  does smoke 2-3 cigarettes a night.  No significant alcohol use.  On exam  in the office, she has an anterior polypoid vocal cord lesion.  She is  taken to operating room at this time for excisional biopsy of right  anterior vocal cord lesion.   DESCRIPTION OF PROCEDURE:  After adequate endotracheal anesthesia,  direct laryngoscopy was performed.  The patient's tongue, vallecula and  epiglottis were all normal.  Both piriform sinuses were clear.  AE folds  were normal.  On evaluation of the endolarynx, the patient had a  polypoid mass attached to the anterior right true vocal cord.  Photos  were obtained.  Then, using cupped forceps and scissors, the polypoid  mass was excised from the anterior right vocal cord.  This was sent to  pathology.  Hemostasis was obtained with cotton pledgets soaked in  adrenaline.  This completed the procedure.  [name] was awoke from  anesthesia and transferred to the recovery room  postoperatively doing  well.   DISPOSITION:  Sherri Mcmillan will be discharged home later this morning on a  regular diet.  She is also placed on Nexium 40 mg daily for 2 weeks,  instructed on voice rest for the next few days and given Tylenol and  Tylenol #3 p.r.n. pain.  Will continue with regular medications and  follow-up in my office in 1 week for recheck and to review pathology.           ______________________________  Kristine Garbe Ezzard Standing, M.D.    CEN/MEDQ  D:  12/29/2006  T:  12/29/2006  Job:  [id number]   cc:   Kristine Garbe. Ezzard Standing, M.D.  Madaline Guthrie, M.D.

## 2010-09-08 NOTE — Consult Note (Signed)
NAMENAYELY, Sherri NO.:  [id number]   MEDICAL RECORD NO.:  [id number]          PATIENT TYPE:  INP   LOCATION:  5028                         FACILITY:  [facility]   PHYSICIAN:  Jordan Hawks. Elnoria Howard, MD    DATE OF BIRTH:  Dec 10, 1932   DATE OF CONSULTATION:  DATE OF DISCHARGE:  12/15/2006                                 CONSULTATION   REASON FOR CONSULTATION:  Acute hematemesis and hematochezia.   HISTORY OF PRESENT ILLNESS:  This is a 77 year old female who was  admitted to the hospital with complaints of severe back pain.   PAST MEDICAL HISTORY:  Hypertension, insomnia, vocal cord polyp and  degenerative disk disease.   She was admitted to the hospital.  Over the course of her  hospitalization the patient was being evaluated for a possible lesion in  her thoracic region versus degenerative disk disease.  Today she acutely  vomited fresh blood and also complained of having hematochezia.  She  denies having a prior history of gastroesophageal reflux disease or any  dysphagia.  She states having vomiting since she was admitted to the  hospital prior to this.  She denies having any upper GI symptoms.  The  patient's hemoglobin is noted to be stable as well as her vital signs.   PAST MEDICAL HISTORY:  As stated above.   PAST SURGICAL HISTORY:  As stated above.   FAMILY HISTORY:  Noncontributory.   SOCIAL HISTORY:  Positive for tobacco.  Negative for alcohol or illicit  drug use.   REVIEW OF SYSTEMS:  Negative for fever, chills, weight loss, dyspnea on  exertion, nausea, vomiting, diarrhea, constipation, jaundice, joint  pains, rashes, heat or cold intolerance.   MEDICATIONS:  1. Lasix 40 mg by mouth daily.  2. Amlodipine 10 mg by mouth daily.  3. Vistaril 25 mg by mouth 3 times a day.  4. Tylenol No. 3.  5. Restoril 50 mg by mouth every night.   PHYSICAL EXAMINATION:  VITAL SIGNS:  Blood pressure 115/73, heart rate  91, respirations 19, temperature 98.8.  GENERAL:  The patient is in no acute distress.  Alert and oriented.  HEENT:  Normocephalic, atraumatic.  Extraocular muscles are intact.  NECK:  Supple.  No lymphadenopathy.  LUNGS:  Clear to auscultation bilaterally.  CARDIOVASCULAR:  Regular rate and rhythm.  ABDOMEN:  Flat, soft and nontender, nondistended.  EXTREMITIES:  No cyanosis, clubbing or edema.   LABORATORY VALUES:  White blood cell count 3.7, hemoglobin is 11.7, MCV  is 85.1, platelets 164, sodium 138, potassium 3.9, chloride 99, CO2 34,  BUN 7, creatinine 0.8, glucose is 119.  Total bilirubin 0.7, alk phos  60, ALT 21, ALT 19.   IMPRESSION:  1. Hematochezia.  2. Hematemesis.   Given the acute nature of her symptoms it is best to evaluate the  patient on an emergent basis.  She is hemodynamically stable, however,  there is a higher likelihood of finding the source of bleeding if the  procedure is performed at this time.   PLAN:  For EGD and flex sig.  Jordan Hawks Elnoria Howard, MD  Electronically Signed     PDH/MEDQ  D:  12/15/2006  T:  12/15/2006  Job:  [id number]

## 2010-09-09 ENCOUNTER — Telehealth: Payer: Self-pay | Admitting: *Deleted

## 2010-09-09 ENCOUNTER — Other Ambulatory Visit: Payer: Self-pay | Admitting: *Deleted

## 2010-09-09 ENCOUNTER — Encounter: Payer: Self-pay | Admitting: Gastroenterology

## 2010-09-09 ENCOUNTER — Ambulatory Visit (AMBULATORY_SURGERY_CENTER): Payer: Medicare Other | Admitting: *Deleted

## 2010-09-09 VITALS — Ht 60.0 in | Wt 129.0 lb

## 2010-09-09 DIAGNOSIS — Z86010 Personal history of colonic polyps: Secondary | ICD-10-CM

## 2010-09-09 MED ORDER — PEG-KCL-NACL-NASULF-NA ASC-C 100 G PO SOLR: ORAL | Status: DC

## 2010-09-09 NOTE — Telephone Encounter (Signed)
Would you please make certain that [name] has been seen by mental health or at least has an appt. See Lupita Leash T's note. If she has, then I will refill. Thanks!

## 2010-09-09 NOTE — Telephone Encounter (Signed)
Opened in error

## 2010-09-09 NOTE — Telephone Encounter (Deleted)
Patient direct from Dr.Butcher,she has had previous colonoscopy in 2000 by Dr.Magod,found adenomatous polyp, then patient had repeat colonoscopy in 09/09/2003 with no polyps.

## 2010-09-09 NOTE — Progress Notes (Signed)
Patient states she tolerated last colonoscopies, and patient states she takes her nerve/pain medications only as needed. She felt she would be okay with moderate sedation as last time.

## 2010-09-10 NOTE — Telephone Encounter (Signed)
Call to pt's home number.  Message left for pt to call the Clinics and to ask for [facility] or the Triage Nurses.

## 2010-09-11 NOTE — Op Note (Signed)
[facility]  Patient:    Sherri Mcmillan, Sherri Mcmillan                        MRN: [id number] Proc. Date: 11/23/00 Adm. Date:  11/23/00 Attending:  Sharlot Gowda., M.D.                           Operative Report  BRIEF HISTORY:  A 77 year old with MRI proven meniscal tear with symptoms thought to be amenable to outpatient arthroscopy right knee.  PREOPERATIVE DIAGNOSIS:  Torn medial meniscus right knee.  POSTOPERATIVE DIAGNOSIS:  Torn medial meniscus right knee with chondromalacia patellofemoral joint and medial compartment.  PROCEDURE: 1. Partial medial meniscectomy. 2. Debridement chondroplasty patellofemoral joint.  SURGEON:  Sharlot Gowda., M.D.  ANESTHESIA:  MAC.  DESCRIPTION OF PROCEDURE:  Arthroscope through superomedial, inferomedial and inferolateral portal.  Systematic inspection of the knee showed grade 3 chondromalacia central to the patella which was debrided.  Lateral compartment showed minor arthritic change and no significant meniscal tearing.  Medial meniscus showed a complex tear of the posterior horn medial meniscus requiring resection of about probably 40% of the meniscus substance, corresponding grade 2, and early grade 3 changes on the tibial, fibular articular surfaces but again these were not advanced.  Debridement chondroplasty carried out on the patellofemoral joint separate from the medial compartment.  Additional debridement of the medial compartment and partial medial meniscectomy.  Knee drained free of fluid.  The portals were infiltrated with Marcaine and morphine and an addition of 80 mg of Depo-Medrol, stitches used to close the portals.  Light compressive sterile dressing applied.  Taken to recovery room in stable condition. DD:  11/23/00 TD:  11/23/00 Job: [id number]

## 2010-09-11 NOTE — Op Note (Signed)
NAME:  Sherri Mcmillan, Sherri Mcmillan                         ACCOUNT NO.:  [id number]   MEDICAL RECORD NO.:  [id number]                   PATIENT TYPE:  AMB   LOCATION:  ENDO                                 FACILITY:  [facility]   PHYSICIAN:  Petra Kuba, M.D.                 DATE OF BIRTH:  10/04/1931   DATE OF PROCEDURE:  09/09/2003  DATE OF DISCHARGE:                                 OPERATIVE REPORT   PROCEDURE:  Colonoscopy.   ENDOSCOPIST:  Petra Kuba, M.D.   INDICATIONS FOR PROCEDURE:  Patient with a history of colon polyps due for  repeat screening.   INFORMED CONSENT:  Consent was signed after risks, benefits, methods and  options were thoroughly discussed in the past.   MEDICATIONS USED:  Demerol 60 mg, Versed 6 mg.   DESCRIPTION OF PROCEDURE:  Rectal inspection was pertinent for external  hemorrhoids, small. Digital exam was negative.   The video pediatric adjustable colonoscope was inserted and easily advanced  to the hepatic flexure.  At that point, there was some looping.  We had to  first roll her on her back and then on her right side to advance to the  cecum.  Other than some left greater than right diverticula, no abnormality  was seen on insertion.  The cecum as identified by the appendiceal orifice  and the ileocecal valve.  The scope was slowly withdrawn.  Prep was  adequate.  There was some liquid stool that required washing and suctioning.  On slow withdrawal through the colon other than the left greater than right  diverticula, no other abnormalities were seen.  There was no sign of  bleeding, polyps or masses.  Once back in the rectum, the scope was inserted  and with abdominal pressure easily advanced around the colon to the cecum.  Upon insertion, no abnormalities were seen.  The cecum was identified by  appendiceal orifice and the ileocecal valve.  Prep was adequate.  There was  some liquid stool that required washing and suctioning.  On slow withdrawal  through  the colon there was a rare left-sided diverticula and some sigmoid  tortuosity.  No polyps or lesions were seen.  Anorectal, posterior and  retroflexion confirmed some small hemorrhoids.  The scope was drained and  inserted a short ways on the left side of the colon.  Air was suctioned and  the scope removed.  The patient tolerated the procedure well.  There were no  obvious immediate complication.   ENDOSCOPIC DIAGNOSES:  1. Internal and external hemorrhoids.  2. Rare left-sided diverticula.  3. Otherwise within normal limits to the cecum.   PLAN:  1. Yearly rectal and guaiacs per Dr. Wyonia Hough.  2. Happy to see back p.r.n.  Otherwise if doing well medically consider     repeat screening in five years.  Petra Kuba, M.D.    MEM/MEDQ  D:  09/09/2003  T:  09/09/2003  Job:  [id number]   cc:   Madaline Guthrie, M.D.  [address]  [place] [address]  Fax: [phone]

## 2010-09-15 ENCOUNTER — Encounter: Payer: Self-pay | Admitting: Internal Medicine

## 2010-09-15 ENCOUNTER — Ambulatory Visit (INDEPENDENT_AMBULATORY_CARE_PROVIDER_SITE_OTHER): Payer: Medicare Other | Admitting: Internal Medicine

## 2010-09-15 VITALS — BP 130/76 | HR 57 | Temp 98.8°F | Ht 61.5 in | Wt 132.6 lb

## 2010-09-15 DIAGNOSIS — I350 Nonrheumatic aortic (valve) stenosis: Secondary | ICD-10-CM

## 2010-09-15 DIAGNOSIS — F419 Anxiety disorder, unspecified: Secondary | ICD-10-CM

## 2010-09-15 DIAGNOSIS — F411 Generalized anxiety disorder: Secondary | ICD-10-CM

## 2010-09-15 DIAGNOSIS — G8929 Other chronic pain: Secondary | ICD-10-CM

## 2010-09-15 DIAGNOSIS — R440 Auditory hallucinations: Secondary | ICD-10-CM

## 2010-09-15 DIAGNOSIS — I359 Nonrheumatic aortic valve disorder, unspecified: Secondary | ICD-10-CM

## 2010-09-15 DIAGNOSIS — R443 Hallucinations, unspecified: Secondary | ICD-10-CM

## 2010-09-15 NOTE — Assessment & Plan Note (Signed)
I am taking her benzo's office her medication list. I am not refilling him. I encouraged her to seek psychiatric help and have made a referral.

## 2010-09-15 NOTE — Assessment & Plan Note (Signed)
[name] has been systolic murmur on exam today. She has a history of aortic stenosis that was mild. Her echo was about 4 years ago so it is time to repeat her echo. I asked that if she decided not to do that she will call and cancel.

## 2010-09-15 NOTE — Patient Instructions (Signed)
Please follow up with psychiatry. I have schedule an ultrasound of your heart to follow up the stenosis.

## 2010-09-15 NOTE — Progress Notes (Signed)
  Subjective:    Patient ID: Sherri Mcmillan, female    DOB: 10/04/1931, 77 y.o.   MRN: [id number]  HPI  Please see the A&P for the status of the pt's chronic medical problems.   Review of Systems  Constitutional: Positive for appetite change and unexpected weight change. Negative for activity change and fatigue.  HENT: Positive for congestion.   Respiratory: Negative for cough and shortness of breath.   Cardiovascular: Positive for leg swelling. Negative for chest pain.  Gastrointestinal: Positive for abdominal distention.  Neurological: Positive for tremors, light-headedness and headaches.  Psychiatric/Behavioral: Positive for hallucinations, behavioral problems, confusion, sleep disturbance and agitation. The patient is nervous/anxious and is hyperactive.        Objective:   Physical Exam  Constitutional: She is oriented to person, place, and time. She appears well-developed and well-nourished. She appears distressed.  HENT:  Head: Normocephalic and atraumatic.  Right Ear: External ear normal.  Left Ear: External ear normal.  Nose: Nose normal.  Eyes: Conjunctivae are normal.  Neck: Neck supple. No tracheal deviation present. No thyromegaly present.  Cardiovascular: Normal rate and regular rhythm.  Exam reveals no gallop and no friction rub.   Murmur heard. Pulmonary/Chest: Effort normal and breath sounds normal.  Abdominal: Soft. She exhibits no distension.  Musculoskeletal: Normal range of motion.  Neurological: She is alert and oriented to person, place, and time.  Skin: Skin is dry. She is not diaphoretic.  Psychiatric: Her mood appears anxious. Her affect is angry. Her speech is rapid and/or pressured. Her speech is not tangential. She is agitated. She is not withdrawn and not actively hallucinating. Thought content is paranoid. Thought content is not delusional. Cognition and memory are not impaired. She expresses inappropriate judgment. She does not exhibit a depressed  mood. She is communicative. She exhibits normal recent memory and normal remote memory. She is attentive.          Assessment & Plan:

## 2010-09-15 NOTE — Assessment & Plan Note (Addendum)
At the end of April just 2 days after my last visit, we got a telephone call that Ms. Simar was acting inappropriately. Please refer to the telephone note. Today she tells me that she was just sitting in her chair reading when she got buzzing in her ear and had a significant headache. Apparently she thinks all of the following symptoms were a result of her headache. She had noises in her head telling her to do things. Apparently she took off all of her clothes and was walking around outside naked. Apparently a relative had to knock out the window to enter her home. Supposedly 2 police cars arrived at some point. We have got notification at that time a phone call that she had gotten some morphine and was threatening to take it at all. Apparently she also told the police that she was going to blow them away. She was not brought to the emergency room nor where she arrested. Her relatives have not allowed her to return to her home to live independently and she is currently since that time about a month ago and living with a friend.  I had given her 60 hydrocodone on April 25. The bottle is now empty. I was not able to get her to understand that this means she is using at least 2 a day if not more. She states that she uses them only when she gets a bad headache to prevent the psychotic symptoms as above. I tried to explain to her that untreated pain does not result in a psychotic break. She had trouble understanding this and simply wanted a refill of her hydrocodone and her anxiety pills to treat her headaches whenever she gets a migraine. She states that Dr. Wyonia Hough always gave her this medicine and doesn't understand why am not refilling it.  She apparently was taken to a psychiatrist downtown and she is most unhappy with the visit. Apparently she had to sit for 3 hours. She was told that she was depressed and as a result of losing her sister with whom she was very close. She made a followup appointment but never  went back. She then started to tell me that the psychiatrist had just gotten out of the hospital after having 2 strokes. She stated that the nurse was shaving the psychiatrist because apparently he was not able to. Some of her statements simply did not make sense. She stated that she is going to report the psychiatrist to the medical board.  I am not refilling any of her controlled substances. I am taking him off of her medication list. I have asked her to followup with psychiatry. I do not think that her psychiatric symptoms are simply a lack of narcotics. I do not think her psychiatric symptoms are a result of uncontrolled pain. She did lose her sister recently but the last was in 2002 and so we are rather far out for this to be typical grief.

## 2010-09-16 ENCOUNTER — Telehealth: Payer: Self-pay | Admitting: Licensed Clinical Social Worker

## 2010-09-16 NOTE — Telephone Encounter (Signed)
Spoke with Jillian at [facility].  New procedure is to fax over demographics and OV notes and they will call patient/daughter and let us know date of appointment.

## 2010-09-16 NOTE — Telephone Encounter (Signed)
Called daughter for follow-up.  She said that she had connected patient to DR. Beaulah Dinning for counseling. I told her that Dr. Rogelia Boga really felt like she needed to see a psychiatrist and Sheran Lawless said if I made the appmt she would get her there.   I left a message with [facility]

## 2010-09-18 ENCOUNTER — Encounter: Payer: Self-pay | Admitting: Internal Medicine

## 2010-09-23 ENCOUNTER — Encounter: Payer: Self-pay | Admitting: Gastroenterology

## 2010-09-23 ENCOUNTER — Ambulatory Visit (AMBULATORY_SURGERY_CENTER): Payer: Medicare Other | Admitting: Gastroenterology

## 2010-09-23 ENCOUNTER — Other Ambulatory Visit: Payer: Self-pay | Admitting: Gastroenterology

## 2010-09-23 VITALS — BP 162/77 | HR 60 | Temp 98.6°F | Resp 16 | Ht 59.0 in | Wt 130.0 lb

## 2010-09-23 DIAGNOSIS — Z1211 Encounter for screening for malignant neoplasm of colon: Secondary | ICD-10-CM

## 2010-09-23 DIAGNOSIS — G43909 Migraine, unspecified, not intractable, without status migrainosus: Secondary | ICD-10-CM

## 2010-09-23 DIAGNOSIS — Z86010 Personal history of colonic polyps: Secondary | ICD-10-CM

## 2010-09-23 MED ORDER — HYDROCODONE-ACETAMINOPHEN 5-500 MG PO TABS: 2.0000 | ORAL_TABLET | Freq: Four times a day (QID) | ORAL | Status: DC | PRN

## 2010-09-23 MED ORDER — SODIUM CHLORIDE 0.9 % IV SOLN: 500.0000 mL | INTRAVENOUS | Status: DC

## 2010-09-23 NOTE — Patient Instructions (Signed)
Normal colon exam today per Dr. Arlyce Dice.  Return for next colonoscopy in 10 years (2022).  Resume your prior medications today.  Please call if any questions or concerns.

## 2010-09-23 NOTE — Progress Notes (Signed)
1191 pt arrive in recovery room.  Pt c/o headache rating it 9/10.  Per pt "my headache makes me hear voices."  Dr. Arlyce Dice made aware of this. I asked if he wanted to make a referral or order a CT.  Per Dr. Arlyce Dice no orders now.  Pt arrive with the headache and it has been going on a couple of weeks.  I advised the pt to check with her medical MD if it continue.  Cool wash cloth to forehead.MAW  Pt assessed again stated headache little better 8/10.  Per Dr. Arlyce Dice okay to discharge to home. MAW   When Dr. Arlyce Dice in to speak with the pt, she requested rx for vicoden.  He said he would given her a few.  Written rx given to the pt.  MAW  Vocoden 5 -500 mg #20 2 po q6h prn no refill.  Rx given to pt's care partner. MAW  Pt ride dropped her and her care partner off at [facility].  Care partner trying to call him.  Unsuccessfully.  He will keep trying.  Pt sitting in conference room. MAW

## 2010-09-24 ENCOUNTER — Telehealth: Payer: Self-pay

## 2010-09-24 NOTE — Telephone Encounter (Signed)
No message left. No Id.

## 2010-09-30 ENCOUNTER — Telehealth: Payer: Self-pay | Admitting: *Deleted

## 2010-09-30 ENCOUNTER — Ambulatory Visit (HOSPITAL_COMMUNITY)
Admission: RE | Admit: 2010-09-30 | Discharge: 2010-09-30 | Disposition: A | Discharge status: Home / Self Care | Payer: Medicare Other | Source: Ambulatory Visit | Attending: Internal Medicine | Admitting: Internal Medicine

## 2010-09-30 DIAGNOSIS — I079 Rheumatic tricuspid valve disease, unspecified: Secondary | ICD-10-CM | POA: Insufficient documentation

## 2010-09-30 DIAGNOSIS — K219 Gastro-esophageal reflux disease without esophagitis: Secondary | ICD-10-CM

## 2010-09-30 DIAGNOSIS — I10 Essential (primary) hypertension: Secondary | ICD-10-CM | POA: Insufficient documentation

## 2010-09-30 DIAGNOSIS — I359 Nonrheumatic aortic valve disorder, unspecified: Secondary | ICD-10-CM | POA: Insufficient documentation

## 2010-09-30 MED ORDER — PANTOPRAZOLE SODIUM 40 MG PO TBEC: 40.0000 mg | DELAYED_RELEASE_TABLET | Freq: Every day | ORAL | Status: DC

## 2010-09-30 NOTE — Telephone Encounter (Signed)
Pt states omeprazole is not working for her. She has stopped fried foods but wants to try something else. I called [facility] and they feel any generic might be covered, but we have to try and see. Will you try something else for her?  If this doesn't work she would like a GI referral for upper GI.   She is having regurgitation into her mouth.

## 2010-09-30 NOTE — Telephone Encounter (Signed)
I am not aware of most insurance companies covering any PPI other than omeprazole. But will Rx protonix. If not effective, get H2B OTC. I went ahead and put in referral for GI.

## 2010-10-07 ENCOUNTER — Encounter: Payer: Self-pay | Admitting: Internal Medicine

## 2010-10-08 ENCOUNTER — Other Ambulatory Visit: Payer: Self-pay | Admitting: *Deleted

## 2010-10-08 ENCOUNTER — Encounter: Payer: Self-pay | Admitting: Gastroenterology

## 2010-10-08 NOTE — Telephone Encounter (Signed)
I discussed with her at last office visit that I was no longer Rxing controlled substances. I encouraged her to F/U with any mental health provider she wanted. I also put in order for Lupita Leash T and pysch.  Per 5/23 Lasandra Beech note, we faxed over referral to [facility].  She needs to get this med from mental health provider.

## 2010-10-08 NOTE — Telephone Encounter (Signed)
Pt called and message left for her to call triage so we can explain med denial

## 2010-10-21 ENCOUNTER — Ambulatory Visit (HOSPITAL_COMMUNITY): Payer: Medicare Other | Admitting: Physician Assistant

## 2010-10-29 ENCOUNTER — Telehealth: Payer: Self-pay | Admitting: *Deleted

## 2010-10-29 NOTE — Telephone Encounter (Signed)
Pt states she is taking her meds as she should and taking plenty of po fluids in but states her feet are as big as watermelons, denies shortness of breath, she states that her feet are very uncomfortable. It is stressed that she needs to be seen as soon as possible, she is transferred to [place]. For appt, appt set for 7/6

## 2010-10-29 NOTE — Telephone Encounter (Addendum)
Agree with plan; will forward to Dr. Rogelia Boga.

## 2010-10-30 ENCOUNTER — Ambulatory Visit (INDEPENDENT_AMBULATORY_CARE_PROVIDER_SITE_OTHER): Payer: Medicare Other | Admitting: Internal Medicine

## 2010-10-30 ENCOUNTER — Encounter: Payer: Self-pay | Admitting: Internal Medicine

## 2010-10-30 DIAGNOSIS — I872 Venous insufficiency (chronic) (peripheral): Secondary | ICD-10-CM

## 2010-10-30 DIAGNOSIS — I10 Essential (primary) hypertension: Secondary | ICD-10-CM

## 2010-10-30 MED ORDER — HYDROXYZINE PAMOATE 25 MG PO CAPS: 25.0000 mg | ORAL_CAPSULE | Freq: Three times a day (TID) | ORAL | Status: DC | PRN

## 2010-10-30 MED ORDER — HYDROCHLOROTHIAZIDE 12.5 MG PO TABS: 12.5000 mg | ORAL_TABLET | Freq: Every day | ORAL | Status: DC

## 2010-10-30 MED ORDER — FUROSEMIDE 40 MG PO TABS: ORAL_TABLET | ORAL | Status: DC

## 2010-10-30 MED ORDER — PROMETHAZINE HCL 12.5 MG PO TABS: 12.5000 mg | ORAL_TABLET | Freq: Four times a day (QID) | ORAL | Status: DC | PRN

## 2010-10-30 NOTE — Assessment & Plan Note (Signed)
Acute on chronic venous insufficiency/swelling.   Unlikely infectious/cellulitis because patient is afebrile, no increased in warmth or erythema or any ulcerative lesions.    -Will d/c norvasc because it could exacerbated her edema -Increase Lasix to 60mg  po qd -Will recheck BMET next week to make sure her Cr is stable -Advise patient to continue elevating her legs above her heart level at least 30-45 minutes per day -Will not prescribed any pain medication because as her swelling decreases, her pain should improve.  Per Dr. Donnelly Stager note, we will not prescribe any narcotics unless patient agrees to see psych

## 2010-10-30 NOTE — Progress Notes (Signed)
HPI:  77 yo woman with chronic venous insufficiency presents for bilateral leg swelling and tenderness, left greater than right that worsen in the past 3 days.  She states that she has been elevating her legs with 2 pillows and rubbing with alcohol and soaking in Epsom salt which improved significantly.  She has been taking Tylenol three tabs per day for her pain.  She denies any fever, chills, n/v, or any other systemic symptoms.  She is wondering why she has to take her blood pressure medication if her BP is normal.  She also wants stronger pain medication for her leg pain.  ROS: As per HPI  PE: General: alert, well-developed, and cooperative to examination.   Lungs: normal respiratory effort, no accessory muscle use, normal breath sounds, no crackles, and no wheezes. Heart: normal rate, regular rhythm, + systolic murmur, no gallop, and no rub.  Abdomen: soft, non-tender, normal bowel sounds, no distention, no guarding, no rebound tenderness Pulses: 2+ DP/PT pulses bilaterally Extremities: No cyanosis, clubbing, + 2 pitting edema on LE L>R, no erythema or warmth, ulceration/lesion/discharge, + tenderness to palpation, palpable varicosities on left LE.   Neurologic: alert & oriented X3, cranial nerves II-XII intact, strength normal in all extremities, sensation intact to light touch, and gait normal.  Psych: Oriented X3, memory intact for recent and remote, normally interactive, good eye contact, not anxious appearing.  Slightly irritated after I informed patient that I will not able to prescribe Vicodin for her leg pain and that it should improve as the swelling decreases.

## 2010-10-30 NOTE — Assessment & Plan Note (Signed)
BP is well-controlled. Will d/c Norvasc as patient is having increased swelling of lower extremities. Start hctz 12.5mg  po qd

## 2010-10-30 NOTE — Patient Instructions (Addendum)
Please take 60mg  of Lasix daily (1.5 tablet) Please stop Norvasc Start taking hydrochlorothiazide 12.5mg  one tablet daily Elevate your legs above your heart at least 30-45 minutes per day to help decrease leg swelling Follow up with Dr. Anselm Jungling in 4-7 days for bloodwork

## 2010-11-06 ENCOUNTER — Ambulatory Visit (INDEPENDENT_AMBULATORY_CARE_PROVIDER_SITE_OTHER): Payer: Medicare Other | Admitting: Internal Medicine

## 2010-11-06 ENCOUNTER — Encounter: Payer: Self-pay | Admitting: Internal Medicine

## 2010-11-06 VITALS — BP 113/66 | HR 53 | Temp 96.6°F | Ht 60.0 in | Wt 127.9 lb

## 2010-11-06 DIAGNOSIS — L723 Sebaceous cyst: Secondary | ICD-10-CM

## 2010-11-06 DIAGNOSIS — R440 Auditory hallucinations: Secondary | ICD-10-CM

## 2010-11-06 DIAGNOSIS — I872 Venous insufficiency (chronic) (peripheral): Secondary | ICD-10-CM

## 2010-11-06 DIAGNOSIS — F419 Anxiety disorder, unspecified: Secondary | ICD-10-CM

## 2010-11-06 DIAGNOSIS — I10 Essential (primary) hypertension: Secondary | ICD-10-CM

## 2010-11-06 DIAGNOSIS — I350 Nonrheumatic aortic (valve) stenosis: Secondary | ICD-10-CM

## 2010-11-06 DIAGNOSIS — Z0000 Encounter for general adult medical examination without abnormal findings: Secondary | ICD-10-CM

## 2010-11-06 DIAGNOSIS — K219 Gastro-esophageal reflux disease without esophagitis: Secondary | ICD-10-CM

## 2010-11-06 NOTE — Assessment & Plan Note (Addendum)
BP down to 113/66 today after replacing Amlodipine with HCTZ.  However, she seems confused about her medication regimen and both amlodipine and HCTZ bottles are in her medication bag, so she may be taking both.  Today I took away and disposed of her amlodipine.  Checked with social work today, and unfortunately Sherri Mcmillan is not eligible per our best knowledge for any free assistance with her meds at home.  Will continue to review her medication regimen with her thoroughly at each visit.    Continue HCTZ 12.5 mg daily.  BMET today since recent increase in Lasix and recent start of HCTZ.

## 2010-11-06 NOTE — Progress Notes (Deleted)
  Subjective:    Patient ID: Sherri Mcmillan, female    DOB: 10/04/1931, 77 y.o.   MRN: [id number]  HPI    Review of Systems     Objective:   Physical Exam        Assessment & Plan:

## 2010-11-06 NOTE — Progress Notes (Signed)
Subjective:    Patient ID: Sherri Mcmillan, female    DOB: 10/04/1931, 77 y.o.   MRN: [id number]  HPI Ms. Rumer is a 77 y/o woman with a history of venous insufficiency in her legs and HTN here for follow-up after increasing her Lasix dose and changing her blood pressure medicine from amlodipine to HCTZ last week.  She reports significantly decreased edema and decreased associated pain in both legs.  She has not needed to take any tylenol for her leg pain.  No fever or chills.  No problems with urinary incontinence or frequency.  No lightheadedness.  She has been keeping her legs elevated frequently as well.    She is a bit confused about her medications today, and she still has amlodipine in her bag of meds as well as two bottles of Lasix.    She also complains of a bump of the back of her head.  It has been there for years but she thinks it may be getting larger.  Review of Systems Review of Systems - History obtained from the patient General ROS: negative for - chills, fatigue, fever, night sweats or weight loss Psychological ROS: negative for - hallucinations Respiratory ROS: Positive for mild SOB at time at baseline.  Cardiovascular ROS: no chest pain or dyspnea on exertion Gastrointestinal ROS: no abdominal pain, change in bowel habits, or black or bloody stools Genito-Urinary ROS: no dysuria, trouble voiding, or hematuria Neurological ROS: Positive for headaches a bit better than baseline.  negative for - bowel and bladder control changes, confusion, gait disturbance or weakness Dermatological ROS: No rashes.  See HPI      Objective:   Physical Exam General: alert, well-developed, and cooperative to examination.  Head: normocephalic and atraumatic.  Eyes: vision grossly intact, pupils equal, pupils round, pupils reactive to light, no injection and anicteric.  Mouth: pharynx pink and moist, no erythema, and no exudates.  Neck: no JVD. Lungs: normal respiratory effort, no accessory  muscle use, normal breath sounds, no crackles, and no wheezes. Heart: normal rate, regular rhythm.  Positive for systolic murmur loudest in aortic area with prominent loud s2.   Msk: no joint swelling, no joint warmth, and no redness over joints.  Pulses: 2+ DP/PT pulses bilaterally Extremities:  Minimal edema in lower extremities.  Skin changes from venous stasis remain, and varicosities present L>R calf.   Neurologic: alert & oriented X3, cranial nerves II-XII intact, strength normal in all extremities, and gait normal.  Skin: turgor normal and no rashes. Small scabbed induration 1mm diameter with underlying firm mass about 1 in diameter on back of head.  Palpation does not express any fluid.  No signficant tenderness to palpation. Psych: Oriented X3, memory intact for recent and remote, normally interactive, good eye contact, not anxious appearing, and not depressed appearing.       Assessment & Plan:

## 2010-11-06 NOTE — Progress Notes (Deleted)
  Subjective:    Patient ID: Sherri Mcmillan, female    DOB: 10/04/1931, 77 y.o.   MRN: [id number]  HPI    Review of Systems     Objective:   Physical Exam        Assessment & Plan:   

## 2010-11-06 NOTE — Assessment & Plan Note (Addendum)
Edema is much improved since last week after increasing Lasix dose.  Chronic skin changes from venous insufficiency remain around ankles, and varicosities L>R leg.  Sherri Mcmillan seems a bit confused about her medication regimen, and she actually has two bottles of Lasix in her medicine bag, so it is possible she is taking more than the prescribed dose of 60mg  per day.  I combined the pills (all 40mg  pills) into one bottle that has instructions on it to take 1 1/2 pills per day.  Continue Lasix 60mg  daily.  Check BMET today.

## 2010-11-06 NOTE — Assessment & Plan Note (Signed)
Patient denies any recent auditory hallucinations today.  Scheduled to be seen by [facility] in August.

## 2010-11-06 NOTE — Patient Instructions (Signed)
Please continue your current medications as we reviewed during your visit.  Please return to clinic in 1 month.

## 2010-11-06 NOTE — Assessment & Plan Note (Signed)
She is seen by Dr. Arlyce Dice.  She had a normal colonoscopy recently and is going back on 11/18/10 for further evaluation.  Continue Protonix for now.

## 2010-11-06 NOTE — Assessment & Plan Note (Addendum)
She reports that her anxiety and depressed mood are doing well recently.  She is currently on no psychiatric medications.  She is back living by herself and manages everything herself, including driving and all ADLs.  She is scheduled to be seen in August by [facility].

## 2010-11-06 NOTE — Assessment & Plan Note (Addendum)
This lesion on the back of her head appears to be consistent with a cyst.  We will continue to monitor and consider referral to surgery if it changes appreciably or bothers Sherri Mcmillan significantly.

## 2010-11-06 NOTE — Assessment & Plan Note (Addendum)
Normal colonoscopy 09/23/10.  Repeat in 10 yrs.  At next follow-up Zostavax and Pneumovax.  Follow-up in one month.

## 2010-11-07 LAB — BASIC METABOLIC PANEL WITH GFR
GFR, Est African American: 57 mL/min — ABNORMAL LOW (ref >60)
GFR, Est Non African American: 47 mL/min — ABNORMAL LOW (ref >60)
Glucose, Bld: 90 mg/dL (ref 70–99)

## 2010-11-09 ENCOUNTER — Telehealth: Payer: Self-pay | Admitting: *Deleted

## 2010-11-09 NOTE — Progress Notes (Signed)
I saw, examined, and discussed the patient with Dr Yaakov Guthrie and agree with the note contained here. Sherri Mcmillan continues to be confused over her meds and has duplicate bottles. [name] states that with her insurance there is no home assistance unless she needs OT / PT which she doesn't.

## 2010-11-09 NOTE — Telephone Encounter (Signed)
Call to pt informed her of need to stop her HCTZ and to only take Lasix 40 mg 1 tablet per day.  Pt was also informed that she will need to follow up on 11/23/2010 at 3;00 PM with Dr. Yaakov Guthrie.  Pt asked about her appointment with the Psychiatrist.  Pt to be called with appointment.

## 2010-11-18 ENCOUNTER — Ambulatory Visit (INDEPENDENT_AMBULATORY_CARE_PROVIDER_SITE_OTHER): Payer: Medicare Other | Admitting: Gastroenterology

## 2010-11-18 ENCOUNTER — Encounter: Payer: Self-pay | Admitting: Gastroenterology

## 2010-11-18 VITALS — BP 132/64 | HR 60 | Ht 59.0 in | Wt 135.0 lb

## 2010-11-18 DIAGNOSIS — K219 Gastro-esophageal reflux disease without esophagitis: Secondary | ICD-10-CM

## 2010-11-18 MED ORDER — OMEPRAZOLE-SODIUM BICARBONATE 40-1100 MG PO CAPS: ORAL_CAPSULE | ORAL | Status: DC

## 2010-11-18 NOTE — Assessment & Plan Note (Addendum)
Symptoms are refractory to therapy with Prilosec.  Recommendations #1 antireflux measures #2 trial of Zegerid 40 mg each bedtime and every morning  #3 upper endoscopy

## 2010-11-18 NOTE — Progress Notes (Signed)
History of Present Illness:  Sherri Mcmillan is a pleasant 77 year old Afro-American female referred at the request of Dr. Rogelia Boga for evaluation of reflux. She's been complaining of severe pyrosis, burning chest and throat pain. Symptoms are especially prominent at night. She is taking Prilosec up to twice a day without relief. She denies dysphagia. She has a sore throat and occasional nonproductive cough. She is on no gastric irritants including nonsteroidals.    Review of Systems: She has frequent headaches and complains of joint and leg pain Pertinent positive and negative review of systems were noted in the above HPI section. All other review of systems were otherwise negative.    Current Medications, Allergies, Past Medical History, Past Surgical History, Family History and Social History were reviewed in [facility] electronic medical record  Vital signs were reviewed in today's medical record. Physical Exam: General: Well developed , well nourished, no acute distress Head: Normocephalic and atraumatic Eyes:  sclerae anicteric, EOMI Ears: Normal auditory acuity Mouth: No deformity or lesions Lungs: There are a few expiratory wheezes Heart: Regular rate and rhythm; no murmurs, rubs or bruits Abdomen: Soft, non tender and non distended. No masses, hepatosplenomegaly or hernias noted. Normal Bowel sounds Rectal:deferred Musculoskeletal: Symmetrical with no gross deformities  Pulses:  Normal pulses noted Extremities: No clubbing, cyanosis, edema or deformities noted Neurological: Alert oriented x 4, grossly nonfocal Psychological:  Alert and cooperative. Normal mood and affect

## 2010-11-18 NOTE — Patient Instructions (Signed)
Gastroesophageal Reflux Disease (GERD) Your caregiver has diagnosed your chest discomfort as caused by gastroesophageal reflux disease (GERD). GERD is caused by a reflux of acid from your stomach into the digestive tube between your mouth and stomach (esophagus). Acid in contact with the esophagus causes soreness (inflammation) resulting in heartburn or chest pain. It may cause small holes in the lining of the esophagus (ulcers). CAUSES  Increased body weight puts pressure on the stomach, making acid rise.   Smoking increases acid production.   Alcohol decreases pressure on the valve between the stomach and esophagus (lower esophageal sphincter), allowing acid from the stomach into the esophagus.   Late evening meals and a full stomach increase pressure and acid production.   Lower esophageal sphincter is malformed.   Sometimes, no reason is found.  HOME CARE INSTRUCTIONS  Change the factors that you can control. Weight, smoking, or alcohol changes may be difficult to change on your own. Your caregiver can provide guidance and medical therapy.   Raising the head of your bed may help you to sleep.   Over-the-counter medicines will decrease acid production. Your caregiver can also prescribe medicines for this. Only take over-the-counter or prescription medicines for pain, discomfort, or fever as directed by your caregiver.   1/2 to 1 teaspoon of an antacid taken every hour while awake, with meals, and at bedtime, can neutralize acid.   DO NOT take aspirin, ibuprofen, or other nonsteroidal anti-inflammatory drugs (NSAIDs).  SEEK IMMEDIATE MEDICAL CARE IF:  The pain changes in location (radiates into arms, neck, jaw, teeth, or back), intensity, or duration.   You start feeling sick to your stomach (nauseous), start throwing up (vomiting), or sweating (diaphoresis).   You develop left arm or jaw pain.   You develop pain going into your back, shortness of breath, or you pass out.    There is vomiting of fluid that is green, yellow, or looks like coffee grounds or blood.  These symptoms could signal other problems, such as heart disease. MAKE SURE YOU:  Understand these instructions.   Will watch your condition.   Will get help right away if you are not doing well or get worse.  Document Released: 01/20/2005 Document Re-Released: 07/07/2009 [employer] Patient Information 2011 [employer]. 

## 2010-11-23 ENCOUNTER — Encounter: Payer: Medicare Other | Admitting: Internal Medicine

## 2010-11-23 ENCOUNTER — Telehealth: Payer: Self-pay | Admitting: *Deleted

## 2010-11-23 ENCOUNTER — Ambulatory Visit (AMBULATORY_SURGERY_CENTER): Payer: Medicare Other | Admitting: Gastroenterology

## 2010-11-23 ENCOUNTER — Encounter: Payer: Self-pay | Admitting: Gastroenterology

## 2010-11-23 VITALS — BP 146/81 | HR 61 | Temp 98.8°F | Resp 12 | Ht 60.0 in | Wt 135.0 lb

## 2010-11-23 DIAGNOSIS — K219 Gastro-esophageal reflux disease without esophagitis: Secondary | ICD-10-CM

## 2010-11-23 DIAGNOSIS — K2270 Barrett's esophagus without dysplasia: Secondary | ICD-10-CM

## 2010-11-23 MED ORDER — SODIUM CHLORIDE 0.9 % IV SOLN: 500.0000 mL | INTRAVENOUS | Status: DC

## 2010-11-23 NOTE — Telephone Encounter (Signed)
Dr Arlyce Dice, The insurance will bot pay for Zegerid at all, Because its a contraindication with her heart condition. We need to change her to something else. What about Nexium

## 2010-11-23 NOTE — Patient Instructions (Signed)
Please refer to your blue and neon green sheets regarding diet and activity for the rest of today. You should receive your biopsy results in the mail in about a week. You may resume your medications as you would normally take them.

## 2010-11-24 ENCOUNTER — Telehealth: Payer: Self-pay | Admitting: *Deleted

## 2010-11-24 ENCOUNTER — Telehealth: Payer: Self-pay | Admitting: Gastroenterology

## 2010-11-24 ENCOUNTER — Telehealth: Payer: Self-pay

## 2010-11-24 MED ORDER — PANTOPRAZOLE SODIUM 40 MG PO TBEC: 40.0000 mg | DELAYED_RELEASE_TABLET | Freq: Two times a day (BID) | ORAL | Status: DC

## 2010-11-24 NOTE — Telephone Encounter (Signed)
Follow up Call- Patient questions:  Do you have a fever, pain , or abdominal swelling? no Pain Score  2 *  Have you tolerated food without any problems? no  Have you been able to return to your normal activities? yes  Do you have any questions about your discharge instructions: Diet   yes Medications  yes Follow up visit  no  Do you have questions or concerns about your Care? yes  Actions: * If pain score is 4 or above: No action needed, pain <4.Patient stated that she still has throat pain and a lot of mucous.  She states a pain level of #2. States " a lot of phelgm This am.    States her insurance would not pay for her new med (zegrid) so the pharmacy gave her something else.  Stated concern about the "test the Dr. Lenna Gilford" Gastric Emptying Study.    She will call if sore throat gets worse or condition worsens.  She will stay on a soft diet today.

## 2010-11-24 NOTE — Telephone Encounter (Signed)
Spoke with pt on the phone.  She had called earlier and no one had returned her call.  She states that she is having pain from yesterdays procedure.  She has pain in her throat and so far she has tried gargling with salt water and using the chloroseptic spray.  She has had no relief from either of these.  Any suggestions on what she should do next?

## 2010-11-24 NOTE — Telephone Encounter (Signed)
Spoke with patient earlier today concerning medication insurance would not pat for. Informed patient will contact Dr. Arlyce Dice office regarding new medication. Spoke with Creola Corn and forward message. Call patient back and explain situation.

## 2010-11-24 NOTE — Telephone Encounter (Signed)
It is not a contraindication unless she is prone to pulmonary edema or congestive heart failure. Please have the patient speak to the insurance company again because I think it is unlikely that it would make that statement is more likely that they don't want to pay for

## 2010-11-24 NOTE — Telephone Encounter (Signed)
1745    Paged Dr. Arlyce Dice regarding this patient.   Patient is still hurting from her EGD of yesterday.  Patient stated that she had a slight sore throat this morning, and I told her to gargle with salt water.    Patient also stated at that time that the zegrid which had been ordered would not be paid for by her insurance company.    Two other RN's have spoken with this patient today regarding this matter.     Dr. Arlyce Dice called me in the recovery room and ordered protonix 40mg  twice daily #30 with 1 refill for the patient.  He also wants her to take tylenol for the pain, and to call us in the morning.    The medicine was faxed to her pharmacy by Johny Blamer, RN and she called the patient to tell her about her new medication.  Merlene Laughter also told her to take tylenol tonight for the pain, and to call us in the morning if she continues to have pain.   Damaris Hippo, RN was notified regarding the situation as well due to her conversation with the patient.

## 2010-11-25 HISTORY — PX: ESOPHAGOGASTRODUODENOSCOPY: SHX1529

## 2010-11-25 NOTE — Telephone Encounter (Signed)
Called pt back today to see how she is feeling.  She states that her throat feels "much better".  She is having someone pick up her prescription today.  She asked about her gastric emptying scan and I told her she should receive a phone call from Dr. Marzetta Board nurse on the 3rd floor to schedule that.  She is to call us back if she has anymore problems or questions.

## 2010-11-25 NOTE — Telephone Encounter (Signed)
Tylenol. Please call back today

## 2010-11-27 ENCOUNTER — Telehealth: Payer: Self-pay | Admitting: *Deleted

## 2010-11-27 DIAGNOSIS — R140 Abdominal distension (gaseous): Secondary | ICD-10-CM

## 2010-11-27 NOTE — Telephone Encounter (Signed)
CALLED PT TO INFORM GES HAS BEEN SCHEDULED WENT OVER INSTRUCTIONS TOLD PT TO CONTACT THE OFFICE IF SHE HAS ANY QUESTIONS

## 2010-11-27 NOTE — Telephone Encounter (Signed)
PROTONIX WAS SENT IN FOR PT. INSURANCE WOULD NOT PAY FOR HER ZEGERID

## 2010-11-30 ENCOUNTER — Telehealth: Payer: Self-pay | Admitting: Gastroenterology

## 2010-11-30 NOTE — Telephone Encounter (Signed)
Pt states that she is still having problems with her throat. She states she is taking the zegerid twice a day but she is still having lots of burning in her throat. States when she swallows it burns and she starts coughing and breaks out in a sweat. Pt states it is worse at night. She is elevating her bed when she sleeps.Dr. Marina Goodell as doc of the day please advise.

## 2010-11-30 NOTE — Telephone Encounter (Signed)
Not familiar with the patient. Based on description, not clearly GI. She could see her PCP. If PCP has already been evaluated her, and they feel that the problem is GI, she can see Dr. Arlyce Dice next week in the office

## 2010-11-30 NOTE — Telephone Encounter (Signed)
Pt last saw Dr. Arlyce Dice 11/18/10 for [name]. Pt had EGD on 11/23/10.  Left message for pt to call back.

## 2010-12-01 ENCOUNTER — Telehealth: Payer: Self-pay | Admitting: *Deleted

## 2010-12-01 ENCOUNTER — Other Ambulatory Visit: Payer: Self-pay | Admitting: *Deleted

## 2010-12-01 NOTE — Telephone Encounter (Signed)
Sherri Mcmillan is to continue to take BOTH of her diuretics:  HCTZ 12.5 mg PO QAM Lasix 60 mg PO QAM.  Please call patient and notify her of this and ask that she make her appointment on 8/10 to further clarify her confusion on her medications.

## 2010-12-01 NOTE — Telephone Encounter (Signed)
Received fax from [facility].  Pt wants to know exactly which diuretic she suppose to be taking; HCTZ 12.5mg  1 tab daily and/or Furosemide 40mg  1.5 tab daily?  She canceled her last appt; next appt is Friday 8/10.  Thanks

## 2010-12-01 NOTE — Telephone Encounter (Signed)
Fax from [facility] pharmacy. Pt wants to know exactly which diuretic she suppose to be taking; HCTZ 12.5mg  1 daily and/or Furosemide 40mg  1.5 tab daily? She canceled her last appt.; next appt is scheduled on Fri 8/10.  Thanks

## 2010-12-01 NOTE — Telephone Encounter (Signed)
Pt aware and will contact her PCP

## 2010-12-02 ENCOUNTER — Telehealth: Payer: Self-pay | Admitting: *Deleted

## 2010-12-02 NOTE — Telephone Encounter (Signed)
Pt informed and will come in Friday

## 2010-12-02 NOTE — Telephone Encounter (Signed)
Pt came by clinic today with questions about her meds. Should she be taking HCTZ 12.5 mg.  Was it stopped  At one time.  Pt request meds for pain in throat and back of leg and back.  She has talked with you about this. She has appointment with Behavioral Health on 8/28 and Dr Arlyce Dice 9/11 She is using tylenol and aleve with no relief.  Pt # [id number]

## 2010-12-02 NOTE — Telephone Encounter (Signed)
Will address her concerns at her follow-up appointment schedule in 2 days.

## 2010-12-02 NOTE — Telephone Encounter (Signed)
Pt states she is not sleeping well and would like vicodin for pain. She does have scheduled appointment on Friday in clinic. I offered appointment for today PM - but she wants to go home.

## 2010-12-04 ENCOUNTER — Encounter: Payer: Self-pay | Admitting: Internal Medicine

## 2010-12-04 ENCOUNTER — Ambulatory Visit (INDEPENDENT_AMBULATORY_CARE_PROVIDER_SITE_OTHER): Payer: Medicare Other | Admitting: Internal Medicine

## 2010-12-04 VITALS — BP 133/69 | HR 53 | Temp 98.4°F | Ht 60.0 in | Wt 131.4 lb

## 2010-12-04 DIAGNOSIS — I10 Essential (primary) hypertension: Secondary | ICD-10-CM

## 2010-12-04 DIAGNOSIS — K219 Gastro-esophageal reflux disease without esophagitis: Secondary | ICD-10-CM

## 2010-12-04 DIAGNOSIS — R443 Hallucinations, unspecified: Secondary | ICD-10-CM

## 2010-12-04 DIAGNOSIS — R440 Auditory hallucinations: Secondary | ICD-10-CM

## 2010-12-04 DIAGNOSIS — I872 Venous insufficiency (chronic) (peripheral): Secondary | ICD-10-CM

## 2010-12-04 DIAGNOSIS — Z0000 Encounter for general adult medical examination without abnormal findings: Secondary | ICD-10-CM

## 2010-12-04 DIAGNOSIS — Z23 Encounter for immunization: Secondary | ICD-10-CM

## 2010-12-04 LAB — BASIC METABOLIC PANEL WITH GFR
CO2: 26 mEq/L (ref 19–32)
Calcium: 9.9 mg/dL (ref 8.4–10.5)
Chloride: 102 mEq/L (ref 96–112)
Potassium: 4.7 mEq/L (ref 3.5–5.3)
Sodium: 138 mEq/L (ref 135–145)

## 2010-12-04 MED ORDER — TRAMADOL HCL 50 MG PO TABS: 50.0000 mg | ORAL_TABLET | Freq: Four times a day (QID) | ORAL | Status: DC | PRN

## 2010-12-04 NOTE — Assessment & Plan Note (Signed)
We gave her a pneumococcal shot today.

## 2010-12-04 NOTE — Assessment & Plan Note (Addendum)
  BP stable.  Continue HCTZ for now.  She was still taking Norvasc although that was discontinued one month ago. She was taking that at last appointment as well and her pills were discarded  She still had one more bottle of  Norvasc which I discarded today. Was advised to get all her medications with next appointment. We need to review her medications with her every  appointment to make sure she is taking them correctly. Will check BMET today.  All of theLab Results  Component Value Date   NA 138 12/04/2010   K 4.7 12/04/2010   CL 102 12/04/2010   CO2 26 12/04/2010   BUN 18 12/04/2010   CREATININE 0.86 12/04/2010   CREATININE 0.82 02/03/2010    BP Readings from Last 3 Encounters:  12/04/10 133/69  11/23/10 146/81  11/18/10 132/64

## 2010-12-04 NOTE — Patient Instructions (Signed)
Please take your medications as prescribed. Please schedule a follow up appointment in 1 month with your PCP. Please keep up with your appointment with Dr. Arlyce Dice and the psychiatrist. Please keep your legs elevated at night that will help with  your pain and swelling.

## 2010-12-04 NOTE — Assessment & Plan Note (Addendum)
Continues to complain of pain in the throat. S/p EGD on 11/23/10 -normal study except Z.line at the GE junction.Pathology shows Barrett's esophagus with no dysplasia. Patient is supposed to undergo barium swallow- as the next step in the evaluation.  -She has an appointment with Dr. Arlyce Dice on 12/11/2010. She was encouraged not to miss that appointment.  -Continue Protonix and Zegerid for now.

## 2010-12-04 NOTE — Assessment & Plan Note (Addendum)
Complains of worsening left lower extremity pain. On exam she had 1+ edema and changes consistent with venous insufficiency.  -Would treat her with tramadol that would help the pain as well as inflammation.  -We'll continue her on current dose of Lasix.  -As mentioned in the previous notes as well,  she continues to be confused about her medications and was still taking Norvasc that was discontinued 1-2 months ago . I discarded the remaining pills today. -Encouraged her to wear compression stockings and limb elevation.

## 2010-12-04 NOTE — Progress Notes (Signed)
  Subjective:    Patient ID: Sherri Mcmillan, female    DOB: 10/04/1931, 77 y.o.   MRN: [id number]  HPI: 77 year old woman with past medical history significant for venous insufficiency, hypertension comes to the clinic for left lower extremity pain and headache.  Patient states that her lower extremity pain is getting worse,  currently rates her pain 9/10. She wears compression stockings everyday and sleeps with her leg elevated with atleast 2 pillows but that has not been helping her.  She also complains of increased acid reflux especially at night and sometimes ''acid comes out from her mouth'' and goes to her head causing headaches.   Review of Systems  Constitutional: Negative for fever, chills, activity change, appetite change and fatigue.  HENT: Negative for nosebleeds, congestion, sore throat, rhinorrhea, voice change, postnasal drip and tinnitus.   Respiratory: Negative for cough, choking, shortness of breath, wheezing and stridor.   Cardiovascular: Positive for leg swelling.  Gastrointestinal: Positive for nausea. Negative for vomiting and abdominal pain.  Genitourinary: Negative for dysuria, decreased urine volume and difficulty urinating.  Musculoskeletal: Negative for arthralgias.  Neurological: Positive for headaches.       Objective:   Physical Exam  Constitutional: She is oriented to person, place, and time. She appears well-developed and well-nourished. No distress.  HENT:  Head: Normocephalic and atraumatic.  Eyes: Conjunctivae are normal. Pupils are equal, round, and reactive to light. Right eye exhibits no discharge. Left eye exhibits no discharge.  Neck: Normal range of motion. Neck supple. No JVD present. No tracheal deviation present. No thyromegaly present.  Cardiovascular: Normal rate, regular rhythm, normal heart sounds and intact distal pulses.  Exam reveals no gallop and no friction rub.   No murmur heard. Pulmonary/Chest: Effort normal and breath sounds  normal. No stridor. No respiratory distress. She has no wheezes. She has no rales. She exhibits no tenderness.  Abdominal: Soft. Bowel sounds are normal. She exhibits no distension and no mass. There is no tenderness. There is no rebound and no guarding.  Musculoskeletal:       2+ pitting edema in LLE , changes consistent with venous stasis, , slightly warm but non erythematous, few palpable varicosities on LLE, tender to palpation.  Lymphadenopathy:    She has no cervical adenopathy.  Neurological: She is alert and oriented to person, place, and time. She has normal reflexes. She displays normal reflexes. No cranial nerve deficit. She exhibits normal muscle tone. Coordination normal.  Skin: She is not diaphoretic.          Assessment & Plan:

## 2010-12-04 NOTE — Assessment & Plan Note (Signed)
She has followup appointment on 12/21/2010. Patient was encouraged not to miss that appointment.

## 2010-12-05 ENCOUNTER — Encounter: Payer: Self-pay | Admitting: Internal Medicine

## 2010-12-07 ENCOUNTER — Telehealth: Payer: Self-pay | Admitting: Gastroenterology

## 2010-12-07 ENCOUNTER — Other Ambulatory Visit: Payer: Self-pay | Admitting: *Deleted

## 2010-12-07 DIAGNOSIS — F419 Anxiety disorder, unspecified: Secondary | ICD-10-CM

## 2010-12-07 MED ORDER — CITALOPRAM HYDROBROMIDE 40 MG PO TABS: 40.0000 mg | ORAL_TABLET | Freq: Every day | ORAL | Status: DC

## 2010-12-07 NOTE — Telephone Encounter (Signed)
Pharmacy would like 6 months refills whenever possible.

## 2010-12-07 NOTE — Telephone Encounter (Signed)
Pt called to verify her appt date and time for her GES. Reviewed appt and instructions with the pt and she verbalized understanding.

## 2010-12-07 NOTE — Telephone Encounter (Signed)
Dr. Rogelia Boga discontinued hydroxyzine on 09/15/2010 secondary to side effects.  Will not refill.  Will also stop tramadol which was just started because of the potential interaction with the citalopram which she needs for her anxiety.  Will ask Dr. Dorthula Rue to call Ms. Rockwood and recommend that she not take the tramadol any longer.  Would recommend acetaminophen for her pain.

## 2010-12-07 NOTE — Telephone Encounter (Signed)
Left message for pt to call back  °

## 2010-12-08 ENCOUNTER — Telehealth: Payer: Self-pay | Admitting: Internal Medicine

## 2010-12-09 NOTE — Telephone Encounter (Signed)
I called the patient and advised her not to take her tramadol. Tried to explain the interaction that it could have with our other medication. She voices understanding. She told me about worsening nausea but has an appointment with Dr. Arlyce Dice on Friday. Was encouraged not to miss her appointment.

## 2010-12-11 ENCOUNTER — Encounter (HOSPITAL_COMMUNITY)
Admission: RE | Admit: 2010-12-11 | Discharge: 2010-12-11 | Disposition: A | Discharge status: Home / Self Care | Payer: Medicare Other | Source: Ambulatory Visit | Attending: Gastroenterology | Admitting: Gastroenterology

## 2010-12-11 DIAGNOSIS — R142 Eructation: Secondary | ICD-10-CM | POA: Insufficient documentation

## 2010-12-11 DIAGNOSIS — R140 Abdominal distension (gaseous): Secondary | ICD-10-CM

## 2010-12-11 DIAGNOSIS — R141 Gas pain: Secondary | ICD-10-CM | POA: Insufficient documentation

## 2010-12-11 DIAGNOSIS — R109 Unspecified abdominal pain: Secondary | ICD-10-CM | POA: Insufficient documentation

## 2010-12-11 DIAGNOSIS — R143 Flatulence: Secondary | ICD-10-CM | POA: Insufficient documentation

## 2010-12-11 MED ORDER — TECHNETIUM TC 99M SULFUR COLLOID
2.0000 | Freq: Once | INTRAVENOUS | Status: AC | PRN
  Administered 2010-12-11: 2 via INTRAVENOUS

## 2010-12-14 ENCOUNTER — Telehealth: Payer: Self-pay

## 2010-12-14 MED ORDER — METOCLOPRAMIDE HCL 10 MG PO TABS: ORAL_TABLET | ORAL | Status: DC

## 2010-12-14 NOTE — Telephone Encounter (Signed)
Pt aware Rx sent to the pharmacy.

## 2010-12-14 NOTE — Telephone Encounter (Signed)
Message copied by Michele Mcalpine on Mon Dec 14, 2010  9:39 AM ------      Message from: Melvia Heaps MD D      Created: Sat Dec 12, 2010  9:05 AM       Has gastroparesis.      Begin reglan 10mg  1/2hr ac and hs      Instruct pt to contact me immediately  if she develops any side effects from her Reglan including paresthesias, tremors, confusion , weakness or muscle spasms.      OV 3-4 weeks

## 2010-12-14 NOTE — Telephone Encounter (Signed)
Left message for pt to call back  °

## 2010-12-15 ENCOUNTER — Telehealth: Payer: Self-pay | Admitting: Gastroenterology

## 2010-12-15 NOTE — Telephone Encounter (Signed)
[facility] called to [facility] [address] for Reg[facility].

## 2010-12-15 NOTE — Telephone Encounter (Signed)
Patient was called and advised not to take tramadol.

## 2010-12-22 ENCOUNTER — Ambulatory Visit (INDEPENDENT_AMBULATORY_CARE_PROVIDER_SITE_OTHER): Payer: Medicare Other | Admitting: Physician Assistant

## 2010-12-22 DIAGNOSIS — F411 Generalized anxiety disorder: Secondary | ICD-10-CM

## 2010-12-29 ENCOUNTER — Other Ambulatory Visit: Payer: Self-pay | Admitting: *Deleted

## 2010-12-29 NOTE — Telephone Encounter (Signed)
I will not Rx controlled health.eds - needs to get at mental health

## 2010-12-30 NOTE — Telephone Encounter (Signed)
Pharmacy made awared. 

## 2011-01-04 ENCOUNTER — Other Ambulatory Visit: Payer: Self-pay | Admitting: Gastroenterology

## 2011-01-05 ENCOUNTER — Encounter: Payer: Self-pay | Admitting: Gastroenterology

## 2011-01-05 ENCOUNTER — Ambulatory Visit: Payer: Medicare Other | Admitting: Internal Medicine

## 2011-01-05 ENCOUNTER — Ambulatory Visit (INDEPENDENT_AMBULATORY_CARE_PROVIDER_SITE_OTHER): Payer: Medicare Other | Admitting: Gastroenterology

## 2011-01-05 DIAGNOSIS — K3184 Gastroparesis: Secondary | ICD-10-CM

## 2011-01-05 DIAGNOSIS — K2270 Barrett's esophagus without dysplasia: Secondary | ICD-10-CM

## 2011-01-05 DIAGNOSIS — K219 Gastro-esophageal reflux disease without esophagitis: Secondary | ICD-10-CM

## 2011-01-05 MED ORDER — PANTOPRAZOLE SODIUM 40 MG PO TBEC: 40.0000 mg | DELAYED_RELEASE_TABLET | Freq: Two times a day (BID) | ORAL | Status: DC

## 2011-01-05 MED ORDER — AMBULATORY NON FORMULARY MEDICATION: 10.0000 mg | Freq: Four times a day (QID) | Status: DC

## 2011-01-05 NOTE — Progress Notes (Signed)
History of Present Illness:  Sherri Mcmillan has returned following upper endoscopy and gastric emptying scan. The former demonstrated Barrett's esophagus. The latter was positive for gastroparesis. On a regimen of Zegerid 40 mg every morning and each bedtime and metoclopramide she continues to suffer from severe pyrosis, especially after bedtime,  with regurgitation of gastric contents. She has to sleep in a chair and claims that she is unable to sleep because of these symptoms.    Review of Systems: She suffers from depression and is tearful and more depressed because of her symptoms and lack of sleep. Pertinent positive and negative review of systems were noted in the above HPI section. All other review of systems were otherwise negative.    Current Medications, Allergies, Past Medical History, Past Surgical History, Family History and Social History were reviewed in [employer] electronic medical record  Vital signs were reviewed in today's medical record. Physical Exam: General: Well developed , well nourished, no acute distress

## 2011-01-05 NOTE — Patient Instructions (Signed)
We have sent in Domperidone to [facility] This pharmacy will contact you about your medication Their phone number is [phone] in case you need to contact them Follow up in 1 week after starting medication Please contact the office for that appointment

## 2011-01-05 NOTE — Assessment & Plan Note (Addendum)
Plan to switch from Reglan to domperidone.  She does have persistence of her symptoms which presumably are exacerbated by gastroparesis.

## 2011-01-05 NOTE — Assessment & Plan Note (Addendum)
She has symptomatic GERD likely exacerbated by gastroparesis.  Recommendations 1!continue twice a day Zegerid #2 switch from Reglan to domperidone

## 2011-01-05 NOTE — Assessment & Plan Note (Signed)
Plan repeat endoscopy in one year

## 2011-01-06 ENCOUNTER — Encounter: Payer: Medicare Other | Admitting: Internal Medicine

## 2011-01-07 ENCOUNTER — Other Ambulatory Visit: Payer: Self-pay | Admitting: *Deleted

## 2011-01-07 MED ORDER — AMLODIPINE BESYLATE 10 MG PO TABS: 10.0000 mg | ORAL_TABLET | Freq: Every day | ORAL | Status: DC

## 2011-01-07 NOTE — Telephone Encounter (Signed)
Pt is also requesting a refill on her Hydroxyzine 25 mg capsules # 60.  Pharmacy would like 90 day refills if possible.

## 2011-01-08 ENCOUNTER — Other Ambulatory Visit: Payer: Self-pay | Admitting: *Deleted

## 2011-01-08 MED ORDER — HYDROXYZINE PAMOATE 25 MG PO CAPS: 25.0000 mg | ORAL_CAPSULE | Freq: Three times a day (TID) | ORAL | Status: AC | PRN

## 2011-01-08 NOTE — Telephone Encounter (Signed)
Called to pharm 

## 2011-01-08 NOTE — Telephone Encounter (Signed)
Myriam Jacobson also sent refill request.  Completed refill there.

## 2011-01-19 ENCOUNTER — Ambulatory Visit: Payer: Medicare Other | Admitting: Internal Medicine

## 2011-01-27 ENCOUNTER — Encounter (HOSPITAL_COMMUNITY): Payer: Medicare Other | Admitting: Physician Assistant

## 2011-01-28 ENCOUNTER — Encounter (HOSPITAL_COMMUNITY)
Admission: RE | Admit: 2011-01-28 | Discharge: 2011-01-28 | Disposition: A | Discharge status: Home / Self Care | Payer: Medicare Other | Source: Ambulatory Visit | Attending: Ophthalmology | Admitting: Ophthalmology

## 2011-01-28 ENCOUNTER — Other Ambulatory Visit (HOSPITAL_COMMUNITY): Payer: Self-pay | Admitting: Ophthalmology

## 2011-01-28 DIAGNOSIS — H35349 Macular cyst, hole, or pseudohole, unspecified eye: Secondary | ICD-10-CM

## 2011-01-28 LAB — BASIC METABOLIC PANEL
BUN: 25 mg/dL — ABNORMAL HIGH (ref 6–23)
Chloride: 104 mEq/L (ref 96–112)
GFR calc Af Amer: 57 mL/min — ABNORMAL LOW (ref >90)
GFR calc non Af Amer: 49 mL/min — ABNORMAL LOW (ref >90)
Glucose, Bld: 83 mg/dL (ref 70–99)
Potassium: 3.9 mEq/L (ref 3.5–5.1)
Sodium: 142 mEq/L (ref 135–145)

## 2011-01-28 LAB — CBC
HCT: 37.2 % (ref 36.0–46.0)
Hemoglobin: 12.4 g/dL (ref 12.0–15.0)
MCHC: 33.3 g/dL (ref 30.0–36.0)
WBC: 4.6 10*3/uL (ref 4.0–10.5)

## 2011-01-29 ENCOUNTER — Encounter: Payer: Self-pay | Admitting: Internal Medicine

## 2011-02-01 ENCOUNTER — Ambulatory Visit (HOSPITAL_COMMUNITY)
Admission: RE | Admit: 2011-02-01 | Discharge: 2011-02-02 | Disposition: A | Discharge status: Home / Self Care | Payer: Medicare Other | Source: Ambulatory Visit | Attending: Ophthalmology | Admitting: Ophthalmology

## 2011-02-01 DIAGNOSIS — H35379 Puckering of macula, unspecified eye: Secondary | ICD-10-CM | POA: Insufficient documentation

## 2011-02-01 DIAGNOSIS — H35359 Cystoid macular degeneration, unspecified eye: Secondary | ICD-10-CM | POA: Insufficient documentation

## 2011-02-01 DIAGNOSIS — Z01812 Encounter for preprocedural laboratory examination: Secondary | ICD-10-CM | POA: Insufficient documentation

## 2011-02-01 DIAGNOSIS — H35349 Macular cyst, hole, or pseudohole, unspecified eye: Secondary | ICD-10-CM | POA: Insufficient documentation

## 2011-02-01 DIAGNOSIS — Z01818 Encounter for other preprocedural examination: Secondary | ICD-10-CM | POA: Insufficient documentation

## 2011-02-01 DIAGNOSIS — Z23 Encounter for immunization: Secondary | ICD-10-CM | POA: Insufficient documentation

## 2011-02-01 DIAGNOSIS — Z01810 Encounter for preprocedural cardiovascular examination: Secondary | ICD-10-CM | POA: Insufficient documentation

## 2011-02-05 LAB — BASIC METABOLIC PANEL
BUN: 12
BUN: 16
BUN: 16
BUN: 3 — ABNORMAL LOW
BUN: 4 — ABNORMAL LOW
BUN: 5 — ABNORMAL LOW
BUN: 7
CO2: 29
CO2: 30
CO2: 34 — ABNORMAL HIGH
Calcium: 9.3
Calcium: 9.4
Chloride: 100
Chloride: 101
Chloride: 102
Chloride: 102
Chloride: 105
Chloride: 99
Creatinine, Ser: 0.69
Creatinine, Ser: 0.78
Creatinine, Ser: 0.87
Creatinine, Ser: 0.87
Creatinine, Ser: 0.88
Creatinine, Ser: 0.94
GFR calc Af Amer: >60
GFR calc Af Amer: >60
GFR calc Af Amer: >60
GFR calc Af Amer: >60
GFR calc non Af Amer: 59 — ABNORMAL LOW
GFR calc non Af Amer: >60
GFR calc non Af Amer: >60
GFR calc non Af Amer: >60
GFR calc non Af Amer: >60
Glucose, Bld: 105 — ABNORMAL HIGH
Glucose, Bld: 106 — ABNORMAL HIGH
Glucose, Bld: 119 — ABNORMAL HIGH
Glucose, Bld: 131 — ABNORMAL HIGH
Glucose, Bld: 87
Glucose, Bld: 95
Potassium: 3.8
Potassium: 3.9
Potassium: 4
Potassium: 4.1
Potassium: 4.6
Sodium: 138
Sodium: 138
Sodium: 138
Sodium: 138
Sodium: 140

## 2011-02-05 LAB — COMPREHENSIVE METABOLIC PANEL
ALT: 19
AST: 21
AST: 29
Albumin: 3.4 — ABNORMAL LOW
Albumin: 3.9
Alkaline Phosphatase: 60
BUN: 16
CO2: 28
Calcium: 9.2
Calcium: 9.3
Chloride: 101
Chloride: 106
Creatinine, Ser: 0.71
Creatinine, Ser: 0.81
GFR calc Af Amer: >60
GFR calc Af Amer: >60
GFR calc non Af Amer: >60
Glucose, Bld: 123 — ABNORMAL HIGH
Potassium: 3.9
Sodium: 141
Total Bilirubin: 0.7
Total Protein: 6.3

## 2011-02-05 LAB — CBC
HCT: 29 — ABNORMAL LOW
HCT: 29 — ABNORMAL LOW
HCT: 32.1 — ABNORMAL LOW
HCT: 32.6 — ABNORMAL LOW
HCT: 33.1 — ABNORMAL LOW
HCT: 35.8 — ABNORMAL LOW
Hemoglobin: 10.1 — ABNORMAL LOW
Hemoglobin: 10.8 — ABNORMAL LOW
Hemoglobin: 10.9 — ABNORMAL LOW
Hemoglobin: 11.3 — ABNORMAL LOW
Hemoglobin: 11.3 — ABNORMAL LOW
Hemoglobin: 9.9 — ABNORMAL LOW
MCHC: 33.8
MCHC: 33.9
MCHC: 33.9
MCHC: 34
MCHC: 34
MCV: 83.8
MCV: 84
MCV: 84.1
MCV: 84.2
MCV: 84.4
MCV: 85.1
MCV: 85.9
Platelets: 158
Platelets: 168
Platelets: 174
Platelets: 181
Platelets: 182
Platelets: 185
Platelets: 194
Platelets: 214
RBC: 3.53 — ABNORMAL LOW
RBC: 3.8 — ABNORMAL LOW
RBC: 3.94
RBC: 4.05
RDW: 13.3
RDW: 13.3
RDW: 13.3
RDW: 13.4
RDW: 13.5
RDW: 13.7
RDW: 13.8
WBC: 3.8 — ABNORMAL LOW
WBC: 4
WBC: 4.3
WBC: 4.4
WBC: 4.4
WBC: 5.1

## 2011-02-05 LAB — URINALYSIS, ROUTINE W REFLEX MICROSCOPIC
Bilirubin Urine: NEGATIVE
Glucose, UA: NEGATIVE
Hgb urine dipstick: NEGATIVE
Ketones, ur: NEGATIVE
Nitrite: NEGATIVE
Protein, ur: NEGATIVE
Specific Gravity, Urine: 1.027
Urobilinogen, UA: 0.2
pH: 5.5

## 2011-02-05 LAB — CROSSMATCH
ABO/RH(D): O POS
Antibody Screen: POSITIVE

## 2011-02-05 LAB — POCT I-STAT CREATININE
Creatinine, Ser: 0.9
Operator id: 196461

## 2011-02-05 LAB — I-STAT 8, (EC8 V) (CONVERTED LAB)
Acid-Base Excess: 1
Acid-Base Excess: 1
BUN: 14
BUN: 17
Bicarbonate: 27.3 — ABNORMAL HIGH
Bicarbonate: 24.5 — ABNORMAL HIGH
Chloride: 104
Chloride: 106
Glucose, Bld: 87
HCT: 40
Hemoglobin: 13.6
Hemoglobin: 13.6
Operator id: 196461
Potassium: 4.2
Sodium: 136
TCO2: 29
TCO2: 26
pCO2, Ven: 33.9 — ABNORMAL LOW
pH, Ven: 7.468 — ABNORMAL HIGH

## 2011-02-05 LAB — DIFFERENTIAL
Basophils Absolute: 0
Basophils Relative: 0
Eosinophils Absolute: 0.1
Monocytes Absolute: 0.3
Neutro Abs: 1.8
Neutrophils Relative %: 47

## 2011-02-05 LAB — PROTIME-INR
INR: 1
INR: 1.1
Prothrombin Time: 13.8
Prothrombin Time: 14.2

## 2011-02-05 LAB — APTT
aPTT: 28
aPTT: 34

## 2011-02-05 LAB — CK: Total CK: 78

## 2011-02-10 NOTE — Op Note (Signed)
Sherri Mcmillan, DADAMO NO.:  [id number]  MEDICAL RECORD NO.:  [id number]  LOCATION:  5127                         FACILITY:  [facility]  PHYSICIAN:  Shade Flood, MD       DATE OF BIRTH:  10/04/1931  DATE OF PROCEDURE:  02/01/2011 DATE OF DISCHARGE:                              OPERATIVE REPORT   PREOPERATIVE DIAGNOSIS:  Epiretinal membrane with macular hole. Additional finding of cystoid macular edema, left eye.  PROCEDURE PERFORMED:  Pars plana vitrectomy, membrane peeling, fluid gas exchange, left eye.  SURGEON:  Shade Flood, MD  ESTIMATED BLOOD LOSS:  Less than 1 mL.  COMPLICATIONS:  None.  SPECIMENS FOR PATHOLOGY:  None.  DESCRIPTION:  The patient was prepared and draped in usual fashion for ocular surgery on the left eye.  A solid lid speculum was placed.  The trocar cannula was placed at 430 meridian and the infusion line was inspected and secured.  The tip of the cannula was visualized in the vitreous cavity.  The infusion line was turned on.  Additional trocar cannulas were placed at 9:30 and 2:30, and then the light pipe and vitreous cutter were inserted.  Core vitrectomy was carried out and it was apparent that the patient's posterior vitreous face was attached. The vitreous cutter was placed on suction mode and used to engage the posterior vitreous space, which was then gently peeled from the retinal surface and brought to the mid vitreous cavity.  The vitreous cutter was then used to remove the vitreous up to the vitreous base for 360 degrees.  There were no tears noted on peripheral retinal exam.  Total air-fluid exchange was carried out and 2 droplets of ICG dye were placed on the macula.  The FlexTip cannula was then used to remove the ICG dye from the retina and then balanced salt solution was replaced in the posterior chamber.  A diamond dusted silicone brush was then used to engage the internal limiting membrane and lift it from the  retinal surface.  The pick forcep was then used to strip the ILM with the attached  overlying epiretinal membrane from the retinal surface in a circumferential manner around the extreme cystic retinal edema.    The  center of the fovea had a large cyst. On exam, it was intact at the time of surgery and was not a through and through hole.  After completion of removal of the ILM, total air-fluid exchange was again carried out. Kenalog 0.1 mL was then placed over the macula and then the cannulas at 9:30 and 2:30 were removed with concomitant tamponade with a cotton tip applicator.  The infusion line was then removed with concomitant closure with the cotton tip applicator.  The patient was given 40 mg of triamcinolone acetate in a posterior subtenons fashion that was performed without complication.  The patient was given subconjunctival Ancef for a total of 100 mg.  The lid speculum was removed and a retrobulbar block was done with 4 mL of 0.75% Marcaine.  The eye was then patched using polymyxin/bacitracin ophthalmic ointment, and then a plastic shield was placed.  The patient was uneventfully extubated and then transferred with stable  vital signs from the operating room to the postoperative recovery area.          ______________________________ Shade Flood, MD     GG/MEDQ  D:  02/01/2011  T:  02/01/2011  Job:  [id number]  Electronically Signed by Shade Flood MD on 02/10/2011 02:55:41 PM

## 2011-02-24 ENCOUNTER — Encounter (HOSPITAL_COMMUNITY): Payer: Medicare Other | Admitting: Physician Assistant

## 2011-03-22 ENCOUNTER — Telehealth: Payer: Self-pay | Admitting: *Deleted

## 2011-03-22 ENCOUNTER — Other Ambulatory Visit: Payer: Self-pay | Admitting: *Deleted

## 2011-03-22 MED ORDER — PANTOPRAZOLE SODIUM 40 MG PO TBEC: 40.0000 mg | DELAYED_RELEASE_TABLET | Freq: Two times a day (BID) | ORAL | Status: DC

## 2011-03-22 NOTE — Telephone Encounter (Signed)
Pt is requesting a refill for Hydroxyzine 25 mg capsules # 30.  Take 1 capsule by mouth 3 times a day as needed for itching.  Pt uses [facility].  Angelina Ok, RN 03/22/2011 4:34 PM

## 2011-03-22 NOTE — Telephone Encounter (Signed)
Pt was called about Vistaril no longer on med list; no answer , message left per Dr. Rogelia Boga.

## 2011-03-22 NOTE — Telephone Encounter (Signed)
I am not refilling the vistaril as I took her off any CNS affecting meds earlier as she has had hallucinations but will not see pysch.

## 2011-03-22 NOTE — Telephone Encounter (Signed)
Also request refill on: Vistaril  25mg  -take 1 cap by mouth  3 times daily as needed. Pt has changed pharmacy; needs rx sent to [facility] for Protonix.  Pt states she has been Taking Prilosec, but looks like it was changed to Protonix.

## 2011-03-22 NOTE — Telephone Encounter (Signed)
E-prescribe not working; [employer] rx faxed to [facility].

## 2011-03-23 ENCOUNTER — Ambulatory Visit: Payer: Medicare Other | Admitting: Internal Medicine

## 2011-03-23 NOTE — Telephone Encounter (Signed)
Denied. Please see last refill request.

## 2011-04-07 ENCOUNTER — Encounter: Payer: Self-pay | Admitting: Internal Medicine

## 2011-04-07 ENCOUNTER — Ambulatory Visit (INDEPENDENT_AMBULATORY_CARE_PROVIDER_SITE_OTHER): Payer: Medicare Other | Admitting: Internal Medicine

## 2011-04-07 ENCOUNTER — Ambulatory Visit (HOSPITAL_COMMUNITY): Payer: Medicare Other | Admitting: Physician Assistant

## 2011-04-07 VITALS — BP 133/78 | HR 57 | Temp 97.0°F | Resp 18 | Wt 139.2 lb

## 2011-04-07 DIAGNOSIS — B9789 Other viral agents as the cause of diseases classified elsewhere: Secondary | ICD-10-CM

## 2011-04-07 DIAGNOSIS — J111 Influenza due to unidentified influenza virus with other respiratory manifestations: Secondary | ICD-10-CM

## 2011-04-07 MED ORDER — HYDROCODONE-HOMATROPINE 5-1.5 MG/5ML PO SYRP: 5.0000 mL | ORAL_SOLUTION | Freq: Four times a day (QID) | ORAL | Status: AC | PRN

## 2011-04-07 MED ORDER — OSELTAMIVIR PHOSPHATE 75 MG PO CAPS: 75.0000 mg | ORAL_CAPSULE | Freq: Two times a day (BID) | ORAL | Status: AC

## 2011-04-07 NOTE — Progress Notes (Signed)
Subjective:   Patient ID: Sherri Mcmillan female   DOB: 10/04/1931 77 y.o.   MRN: [id number]  HPI: Sherri Mcmillan is a 77 y.o. who comes in complaining of acute onset muscle aches, fatigue and URI symptoms. Sore throat, cough, rhinorrhea. Cough non-productive, no dyspnea, severe "cold chills". Low grade temp. Did receive flu vaccine in October. No chest pain.      Past Medical History  Diagnosis Date  . Chronic pain     DJD knees, back, migranes, LLE varicose veins, and LLE neuropathy.   . Aortic stenosis     Dx ECHO 2008 , mild and aymptomatic , needs ECHO q 3-5 yrs  . GERD (gastroesophageal reflux disease)     Barrett's esophagus demonstrated on EGD 12/2010 by Dr Arlyce Dice. Needs repeat EGD 12/2011  . Chronic insomnia   . Chronic anxiety     Admission to [facility] (90s or early 2000)  for 6 weeks after mother died. Complicated again by death of her sister 2010. 2012 developed hallucinations and I refused to refill controlled meds unless she see psych which she is not agreable o  . Elevated cholesterol 10/11    LDL 155. Her 10 year risk (decreasing her age to 34 as the calculator won't go to age 77) is 21% ish. If consider her non smoker (which I wouldn't even though she says 1 pak lasts 2 weeks) risk is 12% ish. So goal for LDL is 130 or 100.  Marland Kitchen HTN (hypertension)     Controlled with 2 drug therapy  . Migraine   . Cataract   . Depression   . Gastroparesis     Demonstrated on GES 12/2010 by Dr Dalene Seltzer   Current Outpatient Prescriptions  Medication Sig Dispense Refill  . acetaminophen (TYLENOL) 325 MG tablet Take 650 mg by mouth every 6 (six) hours as needed.        . AMBULATORY NON FORMULARY MEDICATION Take 10 mg by mouth QID. Medication Name: DOMPERIDONE 10MG  1 BEFORE MEALS AND AT BEDTIME  120 capsule  3  . amLODipine (NORVASC) 10 MG tablet Take 1 tablet (10 mg total) by mouth daily.  30 tablet  5  . aspirin 81 MG tablet Take 81 mg by mouth daily.        . citalopram (CELEXA) 40 MG  tablet Take 1 tablet (40 mg total) by mouth daily.  30 tablet  11  . furosemide (LASIX) 40 MG tablet Take 1.5 tablet by mouth daily  60 tablet  3  . gabapentin (NEURONTIN) 300 MG capsule Take 300 mg by mouth at bedtime.        . hydrochlorothiazide (HYDRODIURIL) 12.5 MG tablet Take 1 tablet (12.5 mg total) by mouth daily.  30 tablet  3  . HYDROcodone-homatropine (HYCODAN) 5-1.5 MG/5ML syrup Take 5 mLs by mouth every 6 (six) hours as needed for cough.  120 mL  0  . metoCLOPramide (REGLAN) 10 MG tablet Take at meals and at bedtime.  120 tablet  1  . omeprazole-sodium bicarbonate (ZEGERID) 40-1100 MG per capsule Take one tab before breakfast and at bedtime  60 capsule  2  . oseltamivir (TAMIFLU) 75 MG capsule Take 1 capsule (75 mg total) by mouth 2 (two) times daily.  10 capsule  0  . pantoprazole (PROTONIX) 40 MG tablet TAKE 1 TABLET BY MOUTH TWICE A DAY  30 tablet  1  . pantoprazole (PROTONIX) 40 MG tablet Take 1 tablet (40 mg total) by mouth 2 (two) times daily.  60 tablet  11  . promethazine (PHENERGAN) 12.5 MG tablet Take 12.5 mg by mouth every 6 (six) hours as needed.         Family History  Problem Relation Age of Onset  . Stroke Neg Hx   . Cancer Neg Hx   . Colon cancer Neg Hx   . Heart disease Mother   . Hypertension Mother    History   Social History  . Marital Status: Single    Spouse Name: N/A    Number of Children: N/A  . Years of Education: N/A   Social History Main Topics  . Smoking status: Former Smoker    Types: Cigarettes  . Smokeless tobacco: Never Used  . Alcohol Use: No  . Drug Use: No  . Sexually Active: None   Other Topics Concern  . None   Social History Narrative   Volunteers at middle school to be a grandmother to the other children in needVolunteers at a radio station and is close with her church familySings with church and has several CDsHer sister died on 01/26/10 and she is in the grieving process and trying to comfort her sisters kidsSmokes one pack  every 2 weeks   Review of Systems: Per HPI.  Objective:  Physical Exam: Filed Vitals:   04/07/11 1520  BP: 133/78  Pulse: 57  Temp: 97 F (36.1 C)  TempSrc: Oral  Resp: 18  Weight: 63.141 kg (139 lb 3.2 oz)   Appears weak tired, wrapped up in 2 coats, rigors but no measured fever. Looks weak. No neck adenopathy, throat is mildly red, nasal drainage is clear, Lungs with good air movement and scattered rhonchi. No wheezing. CV RRR. Abdomen is soft NT. Extremities w/o significant edema.  Assessment & Plan:   1. Viral Illness most likely Influenza, given prevalence in community. Will treat with Tamiflu for 5 days for Influenza. Hycodan for cough symptoms at HS. Otherwise symptom management. Return if symptoms worsen.

## 2011-04-07 NOTE — Patient Instructions (Signed)
Influenza, Adult Influenza ("the flu") is a viral infection of the respiratory tract. It causes chills, fever, cough, headache, body aches, and sore throat. Influenza in general will make you feel sicker than when you have a common cold. Symptoms of the illness typically last a few days. Cough and fatigue may continue for as long as 7 to 10 days. Influenza is highly contagious. It spreads easily to others in the droplets from coughs and sneezes. People frequently become infected by touching something that was recently contaminated with the virus and then touch their mouth, nose or eyes. This infection is caused by a virus. Symptoms will not be reduced or improved by taking an antibiotic. Antibiotics are medications that kill bacteria, not viruses. DIAGNOSIS  Diagnosis of influenza is often made based on the history and physical examination as well as the presence of influenza reports occurring in your community. Testing can be done if the diagnosis is not certain. TREATMENT  Since influenza is caused by a virus, antibiotics are not helpful. Your caregiver may prescribe antiviral medicines to shorten the illness and lessen the severity. Your caregiver may also recommend influenza vaccination and/or antiviral medicines for your family members in order to prevent the spread of influenza to them. HOME CARE INSTRUCTIONS  DO NOT GIVE ASPIRIN TO PERSONS WITH INFLUENZA WHO ARE UNDER 18 YEARS OF AGE. This could lead to brain and liver damage (Reye's syndrome). Read the label on over-the-counter medicines.   Stay home from work or school if at all possible until most of your symptoms are gone.   Only take over-the-counter or prescription medicines for pain, discomfort, or fever as directed by your caregiver.   Use a cool mist humidifier to increase air moisture. This will make breathing easier.   Rest until your temperature is nearly normal: 98.6 F (37 C). This usually takes 3 to 4 days. Be sure you get  plenty of rest.   Drink at least eight, eight-ounce glasses of fluids per day. Fluids include water, juice, broth, gelatin, or lemonade.   Cover your mouth and nose when coughing or sneezing and wash your hands often to prevent the spread of this virus to other persons.  PREVENTION  Annual influenza vaccination (flu shots) is the best way to avoid getting influenza. An annual flu shot is now routinely recommended for all adults in the [place] SEEK MEDICAL CARE IF:   You develop shortness of breath while resting.   You have a deep cough with production of mucous or chest pain.   You develop nausea (feeling sick to your stomach), vomiting, or diarrhea.  SEEK IMMEDIATE MEDICAL CARE IF:   You have difficulty breathing, become short of breath, or your skin or nails turn bluish.   You develop severe neck pain or stiffness.   You develop a severe headache, facial pain, or earache.   You have a fever.   You develop nausea or vomiting that cannot be controlled.  Document Released: 04/09/2000 Document Revised: 12/23/2010 Document Reviewed: 02/12/2009 Exit[employer] Patient Information 2012 [employer]. 

## 2011-04-07 NOTE — Assessment & Plan Note (Signed)
Symptoms started 2 days ago,acute onset, progressively getting worse. HA, fever, body aches, fatigue, sore throat and cough. Clinically appears to have flu. Will treat with Tamiflu for 5 days and giove medication Hycodan for cough at hs. Educated patient on viral illness and triggers to return for evaluation.

## 2011-04-09 ENCOUNTER — Other Ambulatory Visit: Payer: Self-pay | Admitting: *Deleted

## 2011-04-09 ENCOUNTER — Other Ambulatory Visit: Payer: Self-pay | Admitting: Gastroenterology

## 2011-04-09 DIAGNOSIS — I10 Essential (primary) hypertension: Secondary | ICD-10-CM

## 2011-04-09 NOTE — Telephone Encounter (Signed)
Also requesting refill on Hydroxyzine 25mg  - Take 1 cap by mouth 3 times a day as needed for itching Qty #30

## 2011-04-12 MED ORDER — HYDROCHLOROTHIAZIDE 12.5 MG PO TABS: 12.5000 mg | ORAL_TABLET | Freq: Every day | ORAL | Status: DC

## 2011-04-12 NOTE — Telephone Encounter (Signed)
Hydroxyzine refused

## 2011-04-13 ENCOUNTER — Ambulatory Visit: Payer: Medicare Other | Admitting: Internal Medicine

## 2011-04-13 NOTE — Telephone Encounter (Signed)
[facility] made awared of refusal of Hydroxyzine.

## 2011-05-04 ENCOUNTER — Ambulatory Visit (HOSPITAL_COMMUNITY): Payer: Medicare Other | Admitting: Physician Assistant

## 2011-05-10 ENCOUNTER — Telehealth: Payer: Self-pay | Admitting: *Deleted

## 2011-05-10 NOTE — Telephone Encounter (Signed)
Denied. From 03/22/11 refills request : I am not refilling the vistaril as I took her off any CNS affecting meds earlier as she has had hallucinations but will not see pysch. For itching she can use topical benedryl, moisturizing lotions, avoid hot baths.

## 2011-05-10 NOTE — Telephone Encounter (Signed)
Request refill on Hydroxyzine cap 25mg  - take 1 cap by mouth 3 times a day as needed for itching   Qty #30

## 2011-05-10 NOTE — Telephone Encounter (Signed)
Stated she tored up the letter b/c she thought he was her "friend" and wanted to continue seeing him; and it was Dr Rae Halsted not Dr Freida Busman who told her "you don't need me anymore".  And stated she does not need a psychiatrist but medical assistance.

## 2011-05-10 NOTE — Telephone Encounter (Signed)
Please ask her to drop off a copy of the letter to the clinic. She no showed her last 4 appts with him, the most recent being 05/04/11. If she needs something for her nerves, then she needs to see a psychiatrist - ie Dr Jorje Guild at [facility]. Sorry to get you in the middle. I have had this conversation with her multiple times.

## 2011-05-10 NOTE — Telephone Encounter (Signed)
Pt states she needs something for "my nerves".  States she had been taking the Hydroxyzine for her nerves and taking Benadryl for the itching. Also wanted you to know she had received a letter from Dr. Freida Busman at [facility]; stated she does not need to see him any longer.

## 2011-05-11 NOTE — Telephone Encounter (Signed)
I called pt and told her that I would not be refilling her hydroxyzine or any other pysch med or opioid. Pt doesn't quite understand. She continues to repeat the Dr Hessie Diener told her she no longer needs pysch care but needs medical care. She will ask him to send me his notes. I cont to tell her that I will not refill pysch meds or opioids.

## 2011-06-07 ENCOUNTER — Ambulatory Visit (HOSPITAL_COMMUNITY): Payer: Medicare Other | Admitting: Physician Assistant

## 2011-06-08 ENCOUNTER — Other Ambulatory Visit: Payer: Self-pay | Admitting: Internal Medicine

## 2011-06-09 ENCOUNTER — Encounter: Payer: Self-pay | Admitting: Internal Medicine

## 2011-06-09 NOTE — Telephone Encounter (Signed)
Reviewed notes. Norvasc was d/c'd summer 2012 2/2 edema. Was to be on lasix and HCTZ only. Pt confused over meds. Pls offer pt appt so that she can bring ALL med bottles and have a med rec

## 2011-06-11 NOTE — Telephone Encounter (Signed)
i have called pt twice and left message for her to rtc, will recall mon

## 2011-06-29 ENCOUNTER — Encounter: Payer: Medicare Other | Admitting: Internal Medicine

## 2011-08-03 ENCOUNTER — Encounter: Payer: Medicare Other | Admitting: Internal Medicine

## 2011-08-05 ENCOUNTER — Other Ambulatory Visit: Payer: Self-pay | Admitting: General Practice

## 2011-08-05 ENCOUNTER — Ambulatory Visit
Admission: RE | Admit: 2011-08-05 | Discharge: 2011-08-05 | Disposition: A | Discharge status: Home / Self Care | Payer: Medicare Other | Source: Ambulatory Visit | Attending: General Practice | Admitting: General Practice

## 2011-08-05 DIAGNOSIS — R52 Pain, unspecified: Secondary | ICD-10-CM

## 2011-08-09 ENCOUNTER — Encounter: Payer: Self-pay | Admitting: Internal Medicine

## 2011-08-09 ENCOUNTER — Ambulatory Visit (INDEPENDENT_AMBULATORY_CARE_PROVIDER_SITE_OTHER): Payer: Medicare Other | Admitting: Internal Medicine

## 2011-08-09 VITALS — BP 110/60 | HR 60 | Temp 97.8°F | Wt 133.2 lb

## 2011-08-09 DIAGNOSIS — I10 Essential (primary) hypertension: Secondary | ICD-10-CM

## 2011-08-09 DIAGNOSIS — G43909 Migraine, unspecified, not intractable, without status migrainosus: Secondary | ICD-10-CM

## 2011-08-09 DIAGNOSIS — R51 Headache: Secondary | ICD-10-CM | POA: Insufficient documentation

## 2011-08-09 DIAGNOSIS — M75101 Unspecified rotator cuff tear or rupture of right shoulder, not specified as traumatic: Secondary | ICD-10-CM | POA: Insufficient documentation

## 2011-08-09 DIAGNOSIS — E7800 Pure hypercholesterolemia, unspecified: Secondary | ICD-10-CM

## 2011-08-09 DIAGNOSIS — G8929 Other chronic pain: Secondary | ICD-10-CM

## 2011-08-09 DIAGNOSIS — M25511 Pain in right shoulder: Secondary | ICD-10-CM

## 2011-08-09 DIAGNOSIS — M67919 Unspecified disorder of synovium and tendon, unspecified shoulder: Secondary | ICD-10-CM

## 2011-08-09 DIAGNOSIS — R519 Headache, unspecified: Secondary | ICD-10-CM | POA: Insufficient documentation

## 2011-08-09 LAB — COMPLETE METABOLIC PANEL WITH GFR
ALT: 9 U/L (ref 0–35)
AST: 18 U/L (ref 0–37)
Alkaline Phosphatase: 57 U/L (ref 39–117)
CO2: 32 mEq/L (ref 19–32)
Creat: 0.96 mg/dL (ref 0.50–1.10)
GFR, Est African American: 66 mL/min
Sodium: 142 mEq/L (ref 135–145)
Total Bilirubin: 0.5 mg/dL (ref 0.3–1.2)
Total Protein: 7.6 g/dL (ref 6.0–8.3)

## 2011-08-09 LAB — LIPID PANEL
Cholesterol: 268 mg/dL — ABNORMAL HIGH (ref 0–200)
LDL Cholesterol: 181 mg/dL — ABNORMAL HIGH (ref 0–99)
Total CHOL/HDL Ratio: 3.7 Ratio
VLDL: 15 mg/dL (ref 0–40)

## 2011-08-09 NOTE — Assessment & Plan Note (Signed)
chronic headache for 6-7 years. She was evaluated by Psychiatrist who prescribe her Trazodone and then remeron. Patient states that she stopped above medications because these did not work for her. She takes Aleeve PRN for her headache.   - heading pads PRN - continue OTC Aleeve.

## 2011-08-09 NOTE — Progress Notes (Signed)
Patient ID: Sherri Mcmillan, female   DOB: [birth year], 78 y.o.   MRN: [id number]  Subjective:   Patient ID: Sherri Mcmillan female   DOB: 09-18-1932 78 y.o.   MRN: [id number]  HPI: Sherri Mcmillan is a 78 y.o. woman with PMH of depression, chronic pain syndrome, chronic anxiety, HLD, HTN who presents to the clinic for her shoulder pain and Migraine headache.  1. Right shoulder pain   Patient states that she has had intermittent right shoulder pain over one month. Her pain is aching pain, 5-10/10, no radiation. She reports that her pain is worsening with usage of her right shoulder. She reports that her pain is worse at nigh time which interferes with her sleep.  She went to her brother PCP who ordered right FA and shoulder X ray which indicated "Acromial spurring may predispose to impingement. No acute abnormality is seen." and she was given some hydrocodone for her pain.   2. headache Patient reports that she has had headache for 6-7 years. She reports seeing bright spots or minor visional loss before some her headache. She reports her headache starts from the front of her head and spread to the entire skull and neck area. Her headache last 30 minutes to one hour. She states that she has had headache daily and takes her brother's hydrocodone with complete relief.   Of note, she has had eye surgery to remove a cyst behind her left eye by Dr. Clarisa Mcmillan.  3. Chronic pain syndrome Patient states that she was referred to a psychiatrist last year, who recommend her to take Trazodone then switched to Remeron 15 mg QHS. Patient took above medications and she felt that they did not help her pain. So she stopped them.  Past Medical History  Diagnosis Date  . Chronic pain     DJD knees, back, migranes, LLE varicose veins, and LLE neuropathy.   . Aortic stenosis     Dx ECHO 2008 , mild and aymptomatic , needs ECHO q 3-5 yrs  . GERD (gastroesophageal reflux disease)     Barrett's esophagus demonstrated  on EGD 12/2010 by Dr Arlyce Dice. Needs repeat EGD 12/2011  . Chronic insomnia   . Chronic anxiety     Admission to [facility] (90s or early 2000)  for 6 weeks after mother died. Complicated again by death of her sister 2010. 2012 developed hallucinations and I refused to refill controlled meds unless she see psych which she is not agreable o  . Elevated cholesterol 10/11    LDL 155. Her 10 year risk (decreasing her age to 16 as the calculator won't go to age 70) is 21% ish. If consider her non smoker (which I wouldn't even though she says 1 pak lasts 2 weeks) risk is 12% ish. So goal for LDL is 130 or 100.  Marland Kitchen HTN (hypertension)     Controlled with 2 drug therapy  . Migraine   . Cataract   . Depression   . Gastroparesis     Demonstrated on GES 12/2010 by Dr Dalene Seltzer   Current Outpatient Prescriptions  Medication Sig Dispense Refill  . acetaminophen (TYLENOL) 325 MG tablet Take 650 mg by mouth every 6 (six) hours as needed.        Marland Kitchen amLODipine (NORVASC) 10 MG tablet Take 1 tablet (10 mg total) by mouth daily.  30 tablet  5  . citalopram (CELEXA) 40 MG tablet Take 1 tablet (40 mg total) by mouth daily.  30 tablet  11  .  furosemide (LASIX) 40 MG tablet Take 1.5 tablet by mouth daily  60 tablet  3  . hydrochlorothiazide (HYDRODIURIL) 12.5 MG tablet Take 1 tablet (12.5 mg total) by mouth daily.  30 tablet  11  . pantoprazole (PROTONIX) 40 MG tablet Take 1 tablet (40 mg total) by mouth 2 (two) times daily.  60 tablet  11  . promethazine (PHENERGAN) 12.5 MG tablet Take 12.5 mg by mouth every 6 (six) hours as needed.        . promethazine (PHENERGAN) 12.5 MG tablet Take 1 tablet (12.5 mg total) by mouth every 6 (six) hours as needed for nausea.  30 tablet  0  . DISCONTD: pantoprazole (PROTONIX) 40 MG tablet TAKE 1 TABLET BY MOUTH TWICE A DAY  30 tablet  1   Family History  Problem Relation Age of Onset  . Stroke Neg Hx   . Cancer Neg Hx   . Colon cancer Neg Hx   . Heart disease Mother   . Hypertension Mother     History   Social History  . Marital Status: Single    Spouse Name: N/A    Number of Children: N/A  . Years of Education: N/A   Social History Main Topics  . Smoking status: Former Smoker    Types: Cigarettes  . Smokeless tobacco: Never Used  . Alcohol Use: No  . Drug Use: No  . Sexually Active: None   Other Topics Concern  . None   Social History Narrative   Volunteers at middle school to be a grandmother to the other children in needVolunteers at a radio station and is close with her church familySings with church and has several CDsHer sister died on 01-22-2010 and she is in the grieving process and trying to comfort her sisters kidsSmokes one pack every 2 weeks   Review of Systems:   No fever, or sore throat. No shortness of breath or dyspnea on exertion. No chest pain, chest pressure or palpitation No nausea, vomiting, or abdominal pain. No melena, diarrhea or incontinence. No muscle weakness.                   Denies depression. No appetite or weight changes.   Objective:  Physical Exam: Filed Vitals:   08/09/11 1448  BP: 110/60  Pulse: 60  Temp: 97.8 F (36.6 C)  TempSrc: Oral  Weight: 133 lb 3.2 oz (60.419 kg)  SpO2: 98%   General: alert, well-developed, and cooperative to examination.  Head: normocephalic and atraumatic.  Eyes: vision grossly intact, pupils equal, pupils round, pupils reactive to light, no injection and anicteric.  Mouth: pharynx pink and moist, no erythema, and no exudates.  Neck: supple, full ROM, no thyromegaly, no JVD, and no carotid bruits.  Lungs: normal respiratory effort, no accessory muscle use, normal breath sounds, no crackles, and no wheezes. Heart: normal rate, regular rhythm, no murmur, no gallop, and no rub.  Abdomen: soft, non-tender, normal bowel sounds, no distention, no guarding, no rebound tenderness, no hepatomegaly, and no splenomegaly.  Msk: no joint swelling, no joint warmth, and no redness over joints. Right shoulder  tenderness to palpation. Limited ROM. No erythema, swelling or warn to touch.  Pulses: 2+ DP/PT pulses bilaterally Extremities: No cyanosis, clubbing, edema Neurologic: alert & oriented X3, cranial nerves II-XII intact, strength normal in all extremities, sensation intact to light touch, and gait normal.  Skin: turgor normal and no rashes.  Psych: Oriented X3, memory intact for recent and remote, normally interactive,  good eye contact, not anxious appearing, and not depressed appearing.   Assessment & Plan:

## 2011-08-09 NOTE — Assessment & Plan Note (Signed)
See my HPI.  Patient states that her Psychiatrist told her that she doesnot have psychiatrist problems.  - will obtain most recent office record

## 2011-08-09 NOTE — Patient Instructions (Signed)
1. Follow up with me in 2 weeks. 2. Will refer you to sports medicine

## 2011-08-09 NOTE — Assessment & Plan Note (Signed)
The clinical manifestation is consistent with arthritic pain. She has had Right shoulder X ray on 08/05/11.  - instruct her to use heading pad PRN. - instruct her to use ibuprofen as needed - send sports medicine referral.

## 2011-08-09 NOTE — Assessment & Plan Note (Signed)
-   will recheck her Lipid panel .

## 2011-08-10 ENCOUNTER — Encounter: Payer: Self-pay | Admitting: Internal Medicine

## 2011-08-10 LAB — CBC
HCT: 38 % (ref 36.0–46.0)
Hemoglobin: 12.4 g/dL (ref 12.0–15.0)
MCH: 29 pg (ref 26.0–34.0)
MCHC: 32.6 g/dL (ref 30.0–36.0)
MCV: 88.8 fL (ref 78.0–100.0)
RBC: 4.28 MIL/uL (ref 3.87–5.11)

## 2011-08-16 ENCOUNTER — Ambulatory Visit (INDEPENDENT_AMBULATORY_CARE_PROVIDER_SITE_OTHER): Payer: Medicare Other | Admitting: Family Medicine

## 2011-08-16 VITALS — BP 149/74

## 2011-08-16 DIAGNOSIS — M719 Bursopathy, unspecified: Secondary | ICD-10-CM

## 2011-08-16 DIAGNOSIS — M67919 Unspecified disorder of synovium and tendon, unspecified shoulder: Secondary | ICD-10-CM

## 2011-08-16 DIAGNOSIS — M7520 Bicipital tendinitis, unspecified shoulder: Secondary | ICD-10-CM

## 2011-08-16 DIAGNOSIS — M7521 Bicipital tendinitis, right shoulder: Secondary | ICD-10-CM

## 2011-08-17 ENCOUNTER — Other Ambulatory Visit: Payer: Self-pay | Admitting: Internal Medicine

## 2011-08-17 NOTE — Progress Notes (Signed)
  Subjective:    Patient ID: Sherri Mcmillan, female    DOB: January 18, 1933, 78 y.o.   MRN: [id number]  HPI  RIGHT shoulder pain x 3-4 weeks. Worsening. Keeping her awake at night. Hurts to raise it above shoulder level. Feels weak. No specific injury.  Pain is mostly upper lateral arm (under deltoid area0 and over bicep area. Right hand dominant PERTINENT  PMH / PSH: No diabetes mellitis + HTN + mental health issues incl chronic anxiety / hallucinations LLE neuropathy (hx of)  Review of Systems    denies numbness or tingling in hand or arm. Has noted no erythema or rash on RUE or right  Shoulder. Denies fever. Objective:   Physical Exam  Vital signs reviewed. GENERAL: Well developed, well nourished, no acute distress RIGHT shoulder---pain with lateral abduction / forward flexion above 90 degrees--has FROm however. Distally NV intact. Negative apprehension test. Shoulder is located. AC joint and [place] joint non tender Bicep is intact , ttp over proximal bicep tendon. Bicep strength intact  ULTRASOUND:  bicep tendon appears to sit in shallow grrove but does not sublux with arm flexion or supination / pronation. Small amount of edema around tendon proximally.  Subscapularis and supras[pinatus are both without gross defect but also both sho some calcifications. Negative dynamic impingement.  INJECTION: Patient was given informed consent, signed copy in the chart. Appropriate time out was taken. Area prepped and drahe bicep tendon sheath.The patient tolerated the procedure well. There were no complications. Post procedure instructions were given.       Assessment & Plan:  1 Rotator cuff syndrome Bicipital tendonitis CSI today and f/u 2 weeks. Will plan to start rehab exercises then. She was ahving so much pain today prior to injection that I think we need to see her back before starting that.

## 2011-08-24 ENCOUNTER — Encounter: Payer: Self-pay | Admitting: Internal Medicine

## 2011-08-24 ENCOUNTER — Ambulatory Visit (INDEPENDENT_AMBULATORY_CARE_PROVIDER_SITE_OTHER): Payer: Medicare Other | Admitting: Internal Medicine

## 2011-08-24 VITALS — BP 121/76 | HR 59 | Temp 98.0°F | Wt 133.4 lb

## 2011-08-24 DIAGNOSIS — G8929 Other chronic pain: Secondary | ICD-10-CM

## 2011-08-24 DIAGNOSIS — M719 Bursopathy, unspecified: Secondary | ICD-10-CM

## 2011-08-24 DIAGNOSIS — M7521 Bicipital tendinitis, right shoulder: Secondary | ICD-10-CM

## 2011-08-24 DIAGNOSIS — M25511 Pain in right shoulder: Secondary | ICD-10-CM

## 2011-08-24 DIAGNOSIS — R51 Headache: Secondary | ICD-10-CM

## 2011-08-24 MED ORDER — NAPROXEN 500 MG PO TABS: 500.0000 mg | ORAL_TABLET | Freq: Two times a day (BID) | ORAL | Status: DC | PRN

## 2011-08-24 MED ORDER — TOPIRAMATE 50 MG PO TABS: 50.0000 mg | ORAL_TABLET | Freq: Two times a day (BID) | ORAL | Status: DC

## 2011-08-24 NOTE — Patient Instructions (Signed)
1. Follow up with me in one month. 2. Will add Topamax and Naproxen 3. Follow up with the sports medicine as scheduled.

## 2011-08-26 DIAGNOSIS — M7521 Bicipital tendinitis, right shoulder: Secondary | ICD-10-CM | POA: Insufficient documentation

## 2011-08-26 NOTE — Progress Notes (Signed)
Patient ID: Sherri Mcmillan, female   DOB: [birth year], 78 y.o.   MRN: [id number] Patient ID: Sherri Mcmillan, female   DOB: 1933/02/10, 78 y.o.   MRN: [id number]  Subjective:   Patient ID: Sherri Mcmillan female   DOB: 03/18/1933 79 y.o.   MRN: [id number]  HPI: Sherri Mcmillan is a 78 y.o. woman with PMH of depression, chronic pain syndrome, chronic anxiety, HLD, HTN who presents to the clinic for follow up since last office visit.  1. Right shoulder pain She has intermittent right shoulder pain which was evaluated by Sports medicine on 08/16/11. She was diagnosed with Rotator cuff syndrome and Bicipital tendonitis.  She received methylprednisolone injection at sports clinic.  2. Headache   Chronic headache for 6-7 years. She reports seeing bright spots or minor visional loss before some her headache. She reports her headache starts from the front of her head and spread to the entire skull and neck area. Her headache last 30 minutes to one hour. Her chronic headache also contributes to her chronic pain syndrome. See below.  3. Chronic pain syndrome Patient states that she was referred to a psychiatrist last year, who recommend her to take Trazodone then switched to Remeron 15 mg QHS. Patient took above medications and she felt that they did not help her pain. So she stopped them.  She was discharged from her Psychiatrist who recommended for her to find a different psychiatrist. She states that she does not need to see a psychiatrist because she feels fine.  Past Medical History  Diagnosis Date  . Chronic pain     DJD knees, back, migranes, LLE varicose veins, and LLE neuropathy.   . Aortic stenosis     Dx ECHO 2008 , mild and aymptomatic , needs ECHO q 3-5 yrs  . GERD (gastroesophageal reflux disease)     Barrett's esophagus demonstrated on EGD 12/2010 by Dr Arlyce Dice. Needs repeat EGD 12/2011  . Chronic insomnia   . Chronic anxiety     Admission to [facility] (90s or early 2000)  for 6 weeks after  mother died. Complicated again by death of her sister 2010. 2012 developed hallucinations and I refused to refill controlled meds unless she see psych which she is not agreable o  . Elevated cholesterol 10/11    LDL 155. Her 10 year risk (decreasing her age to 34 as the calculator won't go to age 83) is 21% ish. If consider her non smoker (which I wouldn't even though she says 1 pak lasts 2 weeks) risk is 12% ish. So goal for LDL is 130 or 100.  Marland Kitchen HTN (hypertension)     Controlled with 2 drug therapy  . Migraine   . Cataract   . Depression   . Gastroparesis     Demonstrated on GES 12/2010 by Dr Dalene Seltzer   Current Outpatient Prescriptions  Medication Sig Dispense Refill  . acetaminophen (TYLENOL) 325 MG tablet Take 650 mg by mouth every 6 (six) hours as needed.        Marland Kitchen amLODipine (NORVASC) 10 MG tablet Take 1 tablet (10 mg total) by mouth daily.  30 tablet  5  . citalopram (CELEXA) 40 MG tablet Take 1 tablet (40 mg total) by mouth daily.  30 tablet  11  . hydrochlorothiazide (HYDRODIURIL) 12.5 MG tablet Take 1 tablet (12.5 mg total) by mouth daily.  30 tablet  11  . pantoprazole (PROTONIX) 40 MG tablet Take 1 tablet (40 mg total) by mouth 2 (  two) times daily.  60 tablet  11  . promethazine (PHENERGAN) 12.5 MG tablet Take 12.5 mg by mouth every 6 (six) hours as needed.        . furosemide (LASIX) 40 MG tablet Take 1.5 tablet by mouth daily  60 tablet  3  . naproxen (NAPROSYN) 500 MG tablet Take 1 tablet (500 mg total) by mouth 2 (two) times daily as needed.  60 tablet  0  . promethazine (PHENERGAN) 12.5 MG tablet Take 1 tablet (12.5 mg total) by mouth every 6 (six) hours as needed for nausea.  30 tablet  0  . topiramate (TOPAMAX) 50 MG tablet Take 1 tablet (50 mg total) by mouth 2 (two) times daily.  60 tablet  2   Family History  Problem Relation Age of Onset  . Stroke Neg Hx   . Cancer Neg Hx   . Colon cancer Neg Hx   . Heart disease Mother   . Hypertension Mother    History   Social  History  . Marital Status: Single    Spouse Name: N/A    Number of Children: N/A  . Years of Education: N/A   Social History Main Topics  . Smoking status: Former Smoker    Types: Cigarettes  . Smokeless tobacco: Never Used  . Alcohol Use: No  . Drug Use: No  . Sexually Active: None   Other Topics Concern  . None   Social History Narrative   Volunteers at middle school to be a grandmother to the other children in needVolunteers at a radio station and is close with her church familySings with church and has several CDsHer sister died on January 25, 2010 and she is in the grieving process and trying to comfort her sisters kidsSmokes one pack every 2 weeks   Review of Systems:   No fever, or sore throat. No shortness of breath or dyspnea on exertion. No chest pain, chest pressure or palpitation No nausea, vomiting, or abdominal pain. No melena, diarrhea or incontinence. No muscle weakness.                   Denies depression. No appetite or weight changes.   Objective:  Physical Exam: Filed Vitals:   08/24/11 0842  BP: 121/76  Pulse: 59  Temp: 98 F (36.7 C)  TempSrc: Oral  Weight: 133 lb 6.4 oz (60.51 kg)   General: alert, well-developed, and cooperative to examination.  Head: normocephalic and atraumatic.  Eyes: vision grossly intact, pupils equal, pupils round, pupils reactive to light, no injection and anicteric.  Mouth: pharynx pink and moist, no erythema, and no exudates.  Neck: supple, full ROM, no thyromegaly, no JVD, and no carotid bruits.  Lungs: normal respiratory effort, no accessory muscle use, normal breath sounds, no crackles, and no wheezes. Heart: normal rate, regular rhythm, no murmur, no gallop, and no rub.  Abdomen: soft, non-tender, normal bowel sounds, no distention, no guarding, no rebound tenderness, no hepatomegaly, and no splenomegaly.  Msk: no joint swelling, no joint warmth, and no redness over joints. Right shoulder tenderness to palpation. Limited ROM. No  erythema, swelling or warn to touch.  Pulses: 2+ DP/PT pulses bilaterally Extremities: No cyanosis, clubbing, edema Neurologic: alert & oriented X3, cranial nerves II-XII intact, strength normal in all extremities, sensation intact to light touch, and gait normal.  Skin: turgor normal and no rashes.  Psych: Oriented X3, memory intact for recent and remote, normally interactive, good eye contact, not anxious appearing, and not depressed  appearing.   Assessment & Plan:

## 2011-08-26 NOTE — Assessment & Plan Note (Signed)
Patient exhibts drug seeking behavior. She insists to be treated with Narcotics for her Headache. And she becomes very emotional when I explain to her that I will not give her Narcotics for her headache and her chronic pain syndrome and that she should continue to follow up with a psychiatrist of her choice.  Patient agrees with Topamax   - will prescribe Topamax

## 2011-08-26 NOTE — Assessment & Plan Note (Signed)
See HPI  - continue to follow up with sports medicine.

## 2011-08-26 NOTE — Assessment & Plan Note (Signed)
See HPI and rotator cuff syndrome

## 2011-08-26 NOTE — Assessment & Plan Note (Signed)
See Dr. Rogelia Boga note.  - will not prescribe any controlled substance or narcotics - patient is encouraged to continue to follow up with psychiatry and she refuses it.

## 2011-08-30 ENCOUNTER — Ambulatory Visit (INDEPENDENT_AMBULATORY_CARE_PROVIDER_SITE_OTHER): Payer: Medicare Other | Admitting: Family Medicine

## 2011-08-30 VITALS — BP 126/70

## 2011-08-30 DIAGNOSIS — M7521 Bicipital tendinitis, right shoulder: Secondary | ICD-10-CM

## 2011-08-30 DIAGNOSIS — M75101 Unspecified rotator cuff tear or rupture of right shoulder, not specified as traumatic: Secondary | ICD-10-CM

## 2011-08-30 DIAGNOSIS — M67919 Unspecified disorder of synovium and tendon, unspecified shoulder: Secondary | ICD-10-CM

## 2011-08-30 DIAGNOSIS — M7520 Bicipital tendinitis, unspecified shoulder: Secondary | ICD-10-CM

## 2011-08-30 NOTE — Patient Instructions (Signed)
We have scheduled you for an appointment with Dr. Jerl Santos at [facility] orthopedic on 09/03/11 at 10 am.  Please arrive at 9:45am.   Their address is [address] [place], and phone number is [phone]

## 2011-08-31 ENCOUNTER — Encounter: Payer: Self-pay | Admitting: Family Medicine

## 2011-08-31 NOTE — Progress Notes (Signed)
  Subjective:    Patient ID: Sherri Mcmillan, female    DOB: 03-24-1933, 78 y.o.   MRN: [id number]  HPI  Followup right shoulder pain. At last office visit we did an ultrasound which showed some bicipital tendinitis as well as some chronic calcifications in her rotator cuff. The rotator cuff was noted to be intact. At that time she had been having RIGHT shoulder pain x 3-4 weeks.  Her symptoms were worsening. And it was keeping her awake at night. At last visit we did a biceps tendon sheath injection and that seemed to be the most significant problem. I was hopeful that she could have some improvement pain is start home exercise program. She has had no specific injury.  Today it continues to hurt to raise it above shoulder level in the arm feels weak. Pain is mostly upper lateral arm (under deltoid area) and over bicep area.  She did have some relief from the pain in the anterior portion of her arm over her bicep area. She is right hand dominant  PERTINENT PMH / PSH:  No diabetes mellitis  + HTN  + mental health issues incl chronic anxiety / hallucinations  LLE neuropathy (hx of) No history of surgery on her right shoulder. She does report very remote history of possible fracture of the humerus but she's not really clear on the specifics of that.   Review of Systems    denies numbness or tingling in her hand. She does have pain in the right shoulder and upper right arm. Has noted no weakness in her hand. Denies fever, sweats, chills. Objective:   Physical Exam  GENERAL: Well-developed female no acute distress. Vital signs are reviewed. SHOULDER: Right shoulder movement in any plane causes her extensive pain. Stiff difficult to get a good exam. The shoulders are symmetrical. The scapula appeared to move normally in symmetrical fashion. The right upper strandy distally he is neurovascularly intact.  IMAGING: I reviewed her ultrasound pictures ( last ov)which showed a lot of calcifications  particularly in the supraspinatus muscle. There is no evidence of a tear in RC musculaure. The bicep tendon was noted to be in a rather shallow groove with some fluid surrounding the sheath.  Review of her x-rays which were done-07/2011--there is not a true outlet view. The right corcoid appears unusually prominent but that may be angle of xray projection. Small osteophytes at tip of acromion---it does not appear to be beaked but difficult for me to tell on these images.. Also reviewed CT scan chest from 04/2008 looking at her coracoid processes--nothing apparent.  INJECTION: Patient was given informed consent, signed copy in the chart. Appropriate time out was taken. Area prepped and draped in usual sterile fashion. 1 cc of methylprednisolone 40 mg/ml plus  4 cc of 1% lidocaine without epinephrine and 2 cc of bupivicaine 1% without epinephrine was injected into the right subacromial space / bursa  using a(n) posterior approach. The patient tolerated the procedure well. There were no complications. Post procedure instructions were given.     Assessment & Plan:  1. Significant shoulder pain. She received no improvement from injection today---I was expecting the lidocaine  alone would give her some temporary relief. She would like further eval so I will set her up with orthopedics. i am sending her [place] pictures with her.

## 2011-09-17 ENCOUNTER — Other Ambulatory Visit: Payer: Self-pay | Admitting: Orthopedic Surgery

## 2011-09-21 ENCOUNTER — Encounter (HOSPITAL_COMMUNITY): Payer: Self-pay | Admitting: Anesthesiology

## 2011-09-21 ENCOUNTER — Observation Stay (HOSPITAL_COMMUNITY)
Admission: RE | Admit: 2011-09-21 | Discharge: 2011-09-22 | Disposition: A | Discharge status: Home / Self Care | Payer: Medicare Other | Source: Ambulatory Visit | Attending: Orthopedic Surgery | Admitting: Orthopedic Surgery

## 2011-09-21 ENCOUNTER — Ambulatory Visit (HOSPITAL_COMMUNITY): Payer: Medicare Other | Admitting: Anesthesiology

## 2011-09-21 ENCOUNTER — Encounter (HOSPITAL_COMMUNITY)
Admission: RE | Disposition: A | Discharge status: Home / Self Care | Payer: Self-pay | Source: Ambulatory Visit | Attending: Orthopedic Surgery

## 2011-09-21 ENCOUNTER — Encounter (HOSPITAL_COMMUNITY): Payer: Self-pay | Admitting: Surgery

## 2011-09-21 DIAGNOSIS — G4700 Insomnia, unspecified: Secondary | ICD-10-CM | POA: Insufficient documentation

## 2011-09-21 DIAGNOSIS — F329 Major depressive disorder, single episode, unspecified: Secondary | ICD-10-CM | POA: Insufficient documentation

## 2011-09-21 DIAGNOSIS — M25519 Pain in unspecified shoulder: Secondary | ICD-10-CM | POA: Insufficient documentation

## 2011-09-21 DIAGNOSIS — G43909 Migraine, unspecified, not intractable, without status migrainosus: Secondary | ICD-10-CM | POA: Insufficient documentation

## 2011-09-21 DIAGNOSIS — H269 Unspecified cataract: Secondary | ICD-10-CM | POA: Insufficient documentation

## 2011-09-21 DIAGNOSIS — F411 Generalized anxiety disorder: Secondary | ICD-10-CM | POA: Insufficient documentation

## 2011-09-21 DIAGNOSIS — M67919 Unspecified disorder of synovium and tendon, unspecified shoulder: Principal | ICD-10-CM | POA: Insufficient documentation

## 2011-09-21 DIAGNOSIS — I10 Essential (primary) hypertension: Secondary | ICD-10-CM | POA: Insufficient documentation

## 2011-09-21 DIAGNOSIS — F3289 Other specified depressive episodes: Secondary | ICD-10-CM | POA: Insufficient documentation

## 2011-09-21 DIAGNOSIS — I872 Venous insufficiency (chronic) (peripheral): Secondary | ICD-10-CM

## 2011-09-21 DIAGNOSIS — M75101 Unspecified rotator cuff tear or rupture of right shoulder, not specified as traumatic: Secondary | ICD-10-CM | POA: Diagnosis present

## 2011-09-21 DIAGNOSIS — K219 Gastro-esophageal reflux disease without esophagitis: Secondary | ICD-10-CM | POA: Insufficient documentation

## 2011-09-21 DIAGNOSIS — F419 Anxiety disorder, unspecified: Secondary | ICD-10-CM

## 2011-09-21 DIAGNOSIS — R51 Headache: Secondary | ICD-10-CM

## 2011-09-21 DIAGNOSIS — M719 Bursopathy, unspecified: Principal | ICD-10-CM | POA: Insufficient documentation

## 2011-09-21 HISTORY — PX: ROTATOR CUFF REPAIR: SHX139

## 2011-09-21 LAB — SURGICAL PCR SCREEN: Staphylococcus aureus: NEGATIVE

## 2011-09-21 LAB — BASIC METABOLIC PANEL
GFR calc Af Amer: 52 mL/min — ABNORMAL LOW (ref >90)
GFR calc non Af Amer: 45 mL/min — ABNORMAL LOW (ref >90)
Glucose, Bld: 88 mg/dL (ref 70–99)
Potassium: 3.8 mEq/L (ref 3.5–5.1)
Sodium: 141 mEq/L (ref 135–145)

## 2011-09-21 LAB — CBC
Hemoglobin: 13.3 g/dL (ref 12.0–15.0)
MCH: 29.6 pg (ref 26.0–34.0)
Platelets: 203 10*3/uL (ref 150–400)
RBC: 4.5 MIL/uL (ref 3.87–5.11)
WBC: 4.8 10*3/uL (ref 4.0–10.5)

## 2011-09-21 SURGERY — SHOULDER ARTHROSCOPY WITH SUBACROMIAL DECOMPRESSION
Anesthesia: General | Site: Shoulder | Laterality: Right | Wound class: Clean

## 2011-09-21 MED ORDER — ONDANSETRON HCL 4 MG/2ML IJ SOLN: 4.0000 mg | Freq: Four times a day (QID) | INTRAMUSCULAR | Status: DC | PRN

## 2011-09-21 MED ORDER — MUPIROCIN 2 % EX OINT
TOPICAL_OINTMENT | CUTANEOUS | Status: AC
  Filled 2011-09-21: qty 22

## 2011-09-21 MED ORDER — METOCLOPRAMIDE HCL 10 MG PO TABS: 5.0000 mg | ORAL_TABLET | Freq: Three times a day (TID) | ORAL | Status: DC | PRN

## 2011-09-21 MED ORDER — SODIUM CHLORIDE 0.9 % IR SOLN
Status: DC | PRN
  Administered 2011-09-21: 3000 mL

## 2011-09-21 MED ORDER — DEXTROSE 5 % IV SOLN
10.0000 mg | INTRAVENOUS | Status: DC | PRN
  Administered 2011-09-21: 25 ug/min via INTRAVENOUS

## 2011-09-21 MED ORDER — KCL IN DEXTROSE-NACL 20-5-0.45 MEQ/L-%-% IV SOLN
INTRAVENOUS | Status: DC
  Filled 2011-09-21 (×3): qty 1000

## 2011-09-21 MED ORDER — TOPIRAMATE 25 MG PO TABS
50.0000 mg | ORAL_TABLET | Freq: Two times a day (BID) | ORAL | Status: DC
  Administered 2011-09-21 – 2011-09-22 (×2): 50 mg via ORAL
  Filled 2011-09-21 (×3): qty 2

## 2011-09-21 MED ORDER — KCL IN DEXTROSE-NACL 20-5-0.45 MEQ/L-%-% IV SOLN
INTRAVENOUS | Status: AC
  Administered 2011-09-21: 1000 mL
  Filled 2011-09-21: qty 1000

## 2011-09-21 MED ORDER — MORPHINE SULFATE 2 MG/ML IJ SOLN
2.0000 mg | INTRAMUSCULAR | Status: DC | PRN
  Administered 2011-09-21 – 2011-09-22 (×4): 2 mg via INTRAVENOUS
  Filled 2011-09-21 (×4): qty 1

## 2011-09-21 MED ORDER — EPHEDRINE SULFATE 50 MG/ML IJ SOLN
INTRAMUSCULAR | Status: DC | PRN
  Administered 2011-09-21: 10 mg via INTRAVENOUS
  Administered 2011-09-21: 15 mg via INTRAVENOUS

## 2011-09-21 MED ORDER — LACTATED RINGERS IV SOLN
INTRAVENOUS | Status: DC | PRN
  Administered 2011-09-21 (×2): via INTRAVENOUS

## 2011-09-21 MED ORDER — METOCLOPRAMIDE HCL 5 MG/ML IJ SOLN: 5.0000 mg | Freq: Three times a day (TID) | INTRAMUSCULAR | Status: DC | PRN

## 2011-09-21 MED ORDER — AMLODIPINE BESYLATE 10 MG PO TABS
10.0000 mg | ORAL_TABLET | Freq: Every day | ORAL | Status: DC
  Administered 2011-09-21: 10 mg via ORAL
  Filled 2011-09-21 (×2): qty 1

## 2011-09-21 MED ORDER — MIDAZOLAM HCL 5 MG/5ML IJ SOLN
INTRAMUSCULAR | Status: DC | PRN
  Administered 2011-09-21: 2 mg via INTRAVENOUS

## 2011-09-21 MED ORDER — CEFAZOLIN SODIUM 1-5 GM-% IV SOLN
INTRAVENOUS | Status: DC | PRN
  Administered 2011-09-21: 1 g via INTRAVENOUS

## 2011-09-21 MED ORDER — CEFAZOLIN SODIUM 1-5 GM-% IV SOLN
INTRAVENOUS | Status: AC
  Filled 2011-09-21: qty 50

## 2011-09-21 MED ORDER — CITALOPRAM HYDROBROMIDE 40 MG PO TABS
40.0000 mg | ORAL_TABLET | Freq: Every day | ORAL | Status: DC
  Administered 2011-09-21 – 2011-09-22 (×2): 40 mg via ORAL
  Filled 2011-09-21 (×2): qty 1

## 2011-09-21 MED ORDER — BUPIVACAINE-EPINEPHRINE 0.25% -1:200000 IJ SOLN
INTRAMUSCULAR | Status: DC | PRN
  Administered 2011-09-21: 10 mL

## 2011-09-21 MED ORDER — TOPIRAMATE 25 MG PO TABS
50.0000 mg | ORAL_TABLET | Freq: Two times a day (BID) | ORAL | Status: DC
  Filled 2011-09-21: qty 2

## 2011-09-21 MED ORDER — ONDANSETRON HCL 4 MG PO TABS: 4.0000 mg | ORAL_TABLET | Freq: Four times a day (QID) | ORAL | Status: DC | PRN

## 2011-09-21 MED ORDER — ROCURONIUM BROMIDE 100 MG/10ML IV SOLN
INTRAVENOUS | Status: DC | PRN
  Administered 2011-09-21: 50 mg via INTRAVENOUS

## 2011-09-21 MED ORDER — FENTANYL CITRATE 0.05 MG/ML IJ SOLN
INTRAMUSCULAR | Status: DC | PRN
  Administered 2011-09-21 (×3): 50 ug via INTRAVENOUS
  Administered 2011-09-21: 100 ug via INTRAVENOUS

## 2011-09-21 MED ORDER — HYDROMORPHONE HCL PF 1 MG/ML IJ SOLN
INTRAMUSCULAR | Status: AC
  Administered 2011-09-21: 0.5 mg via INTRAVENOUS
  Filled 2011-09-21: qty 1

## 2011-09-21 MED ORDER — HYDROCHLOROTHIAZIDE 12.5 MG PO CAPS
12.5000 mg | ORAL_CAPSULE | Freq: Every day | ORAL | Status: DC
  Administered 2011-09-21: 12.5 mg via ORAL
  Filled 2011-09-21 (×2): qty 1

## 2011-09-21 MED ORDER — PANTOPRAZOLE SODIUM 40 MG PO TBEC
40.0000 mg | DELAYED_RELEASE_TABLET | Freq: Two times a day (BID) | ORAL | Status: DC
  Administered 2011-09-21: 40 mg via ORAL
  Filled 2011-09-21 (×2): qty 1

## 2011-09-21 MED ORDER — CEFAZOLIN SODIUM 1-5 GM-% IV SOLN
1.0000 g | Freq: Four times a day (QID) | INTRAVENOUS | Status: AC
  Administered 2011-09-21 – 2011-09-22 (×3): 1 g via INTRAVENOUS
  Filled 2011-09-21 (×3): qty 50

## 2011-09-21 MED ORDER — HYDROMORPHONE HCL PF 1 MG/ML IJ SOLN
0.2500 mg | INTRAMUSCULAR | Status: DC | PRN
  Administered 2011-09-21 (×4): 0.5 mg via INTRAVENOUS

## 2011-09-21 MED ORDER — MUPIROCIN 2 % EX OINT
TOPICAL_OINTMENT | Freq: Two times a day (BID) | CUTANEOUS | Status: DC
  Administered 2011-09-22: 10:00:00 via NASAL
  Filled 2011-09-21: qty 22

## 2011-09-21 MED ORDER — PROPOFOL 10 MG/ML IV BOLUS
INTRAVENOUS | Status: DC | PRN
  Administered 2011-09-21: 100 mg via INTRAVENOUS
  Administered 2011-09-21 (×2): 30 mg via INTRAVENOUS

## 2011-09-21 MED ORDER — HYDROCODONE-ACETAMINOPHEN 5-325 MG PO TABS
1.0000 | ORAL_TABLET | ORAL | Status: DC | PRN
  Administered 2011-09-21 – 2011-09-22 (×2): 2 via ORAL
  Filled 2011-09-21 (×2): qty 2

## 2011-09-21 MED ORDER — HYDROCHLOROTHIAZIDE 25 MG PO TABS: 12.5000 mg | ORAL_TABLET | Freq: Every day | ORAL | Status: DC

## 2011-09-21 MED ORDER — ONDANSETRON HCL 4 MG/2ML IJ SOLN
4.0000 mg | Freq: Four times a day (QID) | INTRAMUSCULAR | Status: DC | PRN
  Administered 2011-09-21: 4 mg via INTRAVENOUS
  Filled 2011-09-21: qty 2

## 2011-09-21 MED ORDER — LACTATED RINGERS IV SOLN
INTRAVENOUS | Status: DC
  Administered 2011-09-21: 16:00:00 via INTRAVENOUS

## 2011-09-21 MED ORDER — NAPROXEN 500 MG PO TABS
500.0000 mg | ORAL_TABLET | Freq: Two times a day (BID) | ORAL | Status: DC | PRN
  Filled 2011-09-21: qty 1

## 2011-09-21 SURGICAL SUPPLY — 65 items
BLADE LONG MED 31X9 (MISCELLANEOUS) IMPLANT
BLADE SURG 11 STRL SS (BLADE) ×3 IMPLANT
BUR OVAL 4.0 (BURR) ×3 IMPLANT
CANISTER OMNI JUG 16 LITER (MISCELLANEOUS) ×1 IMPLANT
CANNULA 5.75X71 LONG (CANNULA) ×3 IMPLANT
CANNULA TWIST IN 8.25X7CM (CANNULA) ×3 IMPLANT
CHLORAPREP W/TINT 26ML (MISCELLANEOUS) ×3 IMPLANT
CLOTH BEACON ORANGE TIMEOUT ST (SAFETY) ×3 IMPLANT
COVER SURGICAL LIGHT HANDLE (MISCELLANEOUS) ×3 IMPLANT
DRAPE INCISE IOBAN 66X45 STRL (DRAPES) ×3 IMPLANT
DRAPE STERI 35X30 U-POUCH (DRAPES) ×3 IMPLANT
DRAPE U-SHAPE 47X51 STRL (DRAPES) ×3 IMPLANT
DRILL BIT PANALOK RC 3.2 (BIT) IMPLANT
DRSG EMULSION OIL 3X3 NADH (GAUZE/BANDAGES/DRESSINGS) ×4 IMPLANT
DRSG PAD ABDOMINAL 8X10 ST (GAUZE/BANDAGES/DRESSINGS) ×6 IMPLANT
ELECT CAUTERY BLADE 6.4 (BLADE) IMPLANT
ELECT NDL TIP 2.8 STRL (NEEDLE) IMPLANT
ELECT NEEDLE TIP 2.8 STRL (NEEDLE) IMPLANT
ELECT REM PT RETURN 9FT ADLT (ELECTROSURGICAL)
ELECTRODE REM PT RTRN 9FT ADLT (ELECTROSURGICAL) IMPLANT
GLOVE BIO SURGEON STRL SZ7 (GLOVE) ×3 IMPLANT
GLOVE BIO SURGEON STRL SZ7.5 (GLOVE) ×6 IMPLANT
GLOVE BIOGEL PI IND STRL 8 (GLOVE) ×2 IMPLANT
GLOVE BIOGEL PI INDICATOR 8 (GLOVE) ×1
GOWN PREVENTION PLUS LG XLONG (DISPOSABLE) ×3 IMPLANT
GOWN STRL NON-REIN LRG LVL3 (GOWN DISPOSABLE) ×6 IMPLANT
KIT BASIN OR (CUSTOM PROCEDURE TRAY) ×3 IMPLANT
KIT ROOM TURNOVER OR (KITS) ×3 IMPLANT
MANIFOLD NEPTUNE II (INSTRUMENTS) ×3 IMPLANT
NDL HYPO 25GX1X1/2 BEV (NEEDLE) ×1 IMPLANT
NDL SPNL 18GX3.5 QUINCKE PK (NEEDLE) ×1 IMPLANT
NDL SUT 6 .5 CRC .975X.05 MAYO (NEEDLE) IMPLANT
NEEDLE HYPO 25GX1X1/2 BEV (NEEDLE) ×3 IMPLANT
NEEDLE MAYO TAPER (NEEDLE)
NEEDLE SPNL 18GX3.5 QUINCKE PK (NEEDLE) ×3 IMPLANT
NS IRRIG 1000ML POUR BTL (IV SOLUTION) ×3 IMPLANT
PACK SHOULDER (CUSTOM PROCEDURE TRAY) ×3 IMPLANT
PAD ARMBOARD 7.5X6 YLW CONV (MISCELLANEOUS) ×6 IMPLANT
RESECTOR FULL RADIUS 4.2MM (BLADE) ×3 IMPLANT
SET ARTHROSCOPY TUBING (MISCELLANEOUS) ×3
SET ARTHROSCOPY TUBING LN (MISCELLANEOUS) ×2 IMPLANT
SLING ARM FOAM STRAP LRG (SOFTGOODS) ×1 IMPLANT
SLING ARM FOAM STRAP MED (SOFTGOODS) ×2 IMPLANT
SPONGE GAUZE 4X4 12PLY (GAUZE/BANDAGES/DRESSINGS) ×3 IMPLANT
SPONGE LAP 4X18 X RAY DECT (DISPOSABLE) ×4 IMPLANT
SUCTION FRAZIER TIP 10 FR DISP (SUCTIONS) IMPLANT
SUPPORT WRAP ARM LG (MISCELLANEOUS) ×3 IMPLANT
SUT BONE WAX W31G (SUTURE) IMPLANT
SUT ETHIBOND 2 OS 4 DA (SUTURE) IMPLANT
SUT ETHIBOND NAB BRD #0 18IN (SUTURE) IMPLANT
SUT ETHILON 3 0 PS 1 (SUTURE) ×1 IMPLANT
SUT ETHILON 4 0 PS 2 18 (SUTURE) ×2 IMPLANT
SUT FIBERWIRE #2 38 T-5 BLUE (SUTURE)
SUT VIC AB 0 CT2 27 (SUTURE) IMPLANT
SUT VIC AB 2-0 CT1 27 (SUTURE)
SUT VIC AB 2-0 CT1 TAPERPNT 27 (SUTURE) IMPLANT
SUT VIC AB 2-0 FS1 27 (SUTURE) IMPLANT
SUTURE FIBERWR #2 38 T-5 BLUE (SUTURE) IMPLANT
SYR CONTROL 10ML LL (SYRINGE) ×3 IMPLANT
TAPE CLOTH SURG 4X10 WHT LF (GAUZE/BANDAGES/DRESSINGS) ×2 IMPLANT
TOWEL OR 17X24 6PK STRL BLUE (TOWEL DISPOSABLE) ×3 IMPLANT
TOWEL OR 17X26 10 PK STRL BLUE (TOWEL DISPOSABLE) ×3 IMPLANT
TUBE CONNECTING 12X1/4 (SUCTIONS) ×3 IMPLANT
WAND 90 DEG TURBOVAC W/CORD (SURGICAL WAND) ×3 IMPLANT
WATER STERILE IRR 1000ML POUR (IV SOLUTION) ×3 IMPLANT

## 2011-09-21 NOTE — Transfer of Care (Signed)
Immediate Anesthesia Transfer of Care Note  Patient: Sherri Mcmillan  Procedure(s) Performed: Procedure(s) (LRB): SHOULDER ARTHROSCOPY WITH ROTATOR CUFF REPAIR AND SUBACROMIAL DECOMPRESSION (Right)  Patient Location: PACU  Anesthesia Type: General  Level of Consciousness: sedated  Airway & Oxygen Therapy: Patient Spontanous Breathing and Patient connected to nasal cannula oxygen  Post-op Assessment: Report given to PACU RN and Post -op Vital signs reviewed and stable  Post vital signs: Reviewed and stable  Complications: No apparent anesthesia complications

## 2011-09-21 NOTE — Anesthesia Postprocedure Evaluation (Signed)
Anesthesia Post Note  Patient: Sherri Mcmillan  Procedure(s) Performed: Procedure(s) (LRB): SHOULDER ARTHROSCOPY WITH SUBACROMIAL DECOMPRESSION (Right)  Anesthesia type: General  Patient location: PACU  Post pain: Pain level controlled and Adequate analgesia  Post assessment: Post-op Vital signs reviewed, Patient's Cardiovascular Status Stable, Respiratory Function Stable, Patent Airway and Pain level controlled  Last Vitals:  Filed Vitals:   09/21/11 1840  BP: 119/64  Pulse: 74  Temp: 36 C  Resp: 19    Post vital signs: Reviewed and stable  Level of consciousness: awake, alert  and oriented  Complications: No apparent anesthesia complications

## 2011-09-21 NOTE — Progress Notes (Signed)
Spoke to [place] at Dr. Veda Canning office, requesting md to sign orders. She stated she will  Send a message to Dr. Ave Filter.

## 2011-09-21 NOTE — H&P (Signed)
Sherri Mcmillan is an 78 y.o. female.   Chief Complaint: R shoulder pain HPI: Severe right shoulder pain with rotator cuff tear and calcific deposits.  Failed nonop tx with PT and injections.  Past Medical History  Diagnosis Date  . Chronic pain     DJD knees, back, migranes, LLE varicose veins, and LLE neuropathy.   . Aortic stenosis     Dx ECHO 2008 , mild and aymptomatic , needs ECHO q 3-5 yrs  . GERD (gastroesophageal reflux disease)     Barrett's esophagus demonstrated on EGD 12/2010 by Dr Arlyce Dice. Needs repeat EGD 12/2011  . Chronic insomnia   . Chronic anxiety     Admission to [facility] (90s or early 2000)  for 6 weeks after mother died. Complicated again by death of her sister 2010. 2012 developed hallucinations and I refused to refill controlled meds unless she see psych which she is not agreable o  . Elevated cholesterol 10/11    LDL 155. Her 10 year risk (decreasing her age to 61 as the calculator won't go to age 58) is 21% ish. If consider her non smoker (which I wouldn't even though she says 1 pak lasts 2 weeks) risk is 12% ish. So goal for LDL is 130 or 100.  Marland Kitchen HTN (hypertension)     Controlled with 2 drug therapy  . Migraine   . Cataract   . Depression   . Gastroparesis     Demonstrated on GES 12/2010 by Dr Dalene Seltzer  . Bronchitis     hx of    Past Surgical History  Procedure Date  . Direct laryngoscopy  September 2008     preoperative diagnosis hoarseness with anterior right vocal cord lesion -  direct laryngoscopy and excisional biopsy of right anterior vocal cord lesion done by Dr. Ezzard Standing  . Meniscectomy  July 2002     preoperative diagnosis torn medial meniscus right knee, partial medial meniscectomy, debridement chondroplasty patellofemoral joint, done by Dr. Madelon Lips  . Abdominal hysterectomy   . Back surgery   . Hemorrhoid surgery   . Appendectomy   . Cataract extraction     left eye  . Polypectomy 2000    Dr.Magod  . Colonoscopy 2000&2005    Dr.Magod  .  Esophagogastroduodenoscopy 11/2010  . Knee arthroscopy   . Eye surgery     Family History  Problem Relation Age of Onset  . Stroke Neg Hx   . Cancer Neg Hx   . Colon cancer Neg Hx   . Anesthesia problems Neg Hx   . Hypotension Neg Hx   . Malignant hyperthermia Neg Hx   . Pseudochol deficiency Neg Hx   . Heart disease Mother   . Hypertension Mother    Social History:  reports that she has quit smoking. Her smoking use included Cigarettes. She has never used smokeless tobacco. She reports that she does not drink alcohol or use illicit drugs.  Allergies:  Allergies  Allergen Reactions  . Aspirin   . Darvocet (Propoxyphene-Acetaminophen)   . Oxycodone Nausea And Vomiting    Patient denies allergy to this medication.    Medications Prior to Admission  Medication Sig Dispense Refill  . acetaminophen (TYLENOL) 325 MG tablet Take 650 mg by mouth every 6 (six) hours as needed.        Marland Kitchen amLODipine (NORVASC) 10 MG tablet Take 1 tablet (10 mg total) by mouth daily.  30 tablet  5  . citalopram (CELEXA) 40 MG tablet Take 1 tablet (  40 mg total) by mouth daily.  30 tablet  11  . furosemide (LASIX) 40 MG tablet Take 1.5 tablet by mouth daily  60 tablet  3  . hydrochlorothiazide (HYDRODIURIL) 12.5 MG tablet Take 1 tablet (12.5 mg total) by mouth daily.  30 tablet  11  . pantoprazole (PROTONIX) 40 MG tablet Take 1 tablet (40 mg total) by mouth 2 (two) times daily.  60 tablet  11  . naproxen (NAPROSYN) 500 MG tablet Take 1 tablet (500 mg total) by mouth 2 (two) times daily as needed.  60 tablet  0  . promethazine (PHENERGAN) 12.5 MG tablet Take 1 tablet (12.5 mg total) by mouth every 6 (six) hours as needed for nausea.  30 tablet  0  . promethazine (PHENERGAN) 12.5 MG tablet Take 12.5 mg by mouth every 6 (six) hours as needed.        . topiramate (TOPAMAX) 50 MG tablet Take 1 tablet (50 mg total) by mouth 2 (two) times daily.  60 tablet  2    Results for orders placed during the hospital  encounter of 09/21/11 (from the past 48 hour(s))  BASIC METABOLIC PANEL     Status: Abnormal   Collection Time   09/21/11  1:32 PM      Component Value Range Comment   Sodium 141  135 - 145 (mEq/L)    Potassium 3.8  3.5 - 5.1 (mEq/L)    Chloride 101  96 - 112 (mEq/L)    CO2 29  19 - 32 (mEq/L)    Glucose, Bld 88  70 - 99 (mg/dL)    BUN 29 (*) 6 - 23 (mg/dL)    Creatinine, Ser 1.61 (*) 0.50 - 1.10 (mg/dL)    Calcium 09.6  8.4 - 10.5 (mg/dL)    GFR calc non Af Amer 45 (*) >90 (mL/min)    GFR calc Af Amer 52 (*) >90 (mL/min)   CBC     Status: Normal   Collection Time   09/21/11  1:32 PM      Component Value Range Comment   WBC 4.8  4.0 - 10.5 (K/uL)    RBC 4.50  3.87 - 5.11 (MIL/uL)    Hemoglobin 13.3  12.0 - 15.0 (g/dL)    HCT 04.5  40.9 - 81.1 (%)    MCV 86.7  78.0 - 100.0 (fL)    MCH 29.6  26.0 - 34.0 (pg)    MCHC 34.1  30.0 - 36.0 (g/dL)    RDW 91.4  78.2 - 95.6 (%)    Platelets 203  150 - 400 (K/uL)   SURGICAL PCR SCREEN     Status: Normal   Collection Time   09/21/11  1:35 PM      Component Value Range Comment   MRSA, PCR NEGATIVE  NEGATIVE     Staphylococcus aureus NEGATIVE  NEGATIVE     No results found.  Review of Systems  All other systems reviewed and are negative.    Blood pressure 136/71, pulse 53, temperature 98.2 F (36.8 C), temperature source Oral, resp. rate 18, last menstrual period 04/26/1950, SpO2 100.00%. Physical Exam  Constitutional: She is oriented to person, place, and time. She appears well-developed.  HENT:  Head: Atraumatic.  Eyes: EOM are normal.  Cardiovascular: Intact distal pulses.   Respiratory: Effort normal.  Musculoskeletal:       Right shoulder: She exhibits decreased range of motion, tenderness and pain.  Neurological: She is alert and oriented to person, place, and  time.  Skin: Skin is warm and dry.  Psychiatric: She has a normal mood and affect.     Assessment/Plan R shoulder rotator cuff tear, calcific tendonitis Plan RCR  vs debridement, poss tuberoplasty, acromioplasty Risks / benefits of surgery discussed Consent on chart  NPO for OR Preop antibiotics   , WILLIAM 09/21/2011, 4:17 PM

## 2011-09-21 NOTE — Op Note (Signed)
Procedure(s): Right SHOULDER ARTHROSCOPY WITH SUBACROMIAL DECOMPRESSION , distal clavicle excision, and debridement of high-grade partial-thickness rotator cuff tear and extensive calcific deposits   Procedure Note  Sherri Mcmillan female 78 y.o. 09/21/2011  Procedure(s) and Anesthesia Type:    *  Right SHOULDER ARTHROSCOPY WITH SUBACROMIAL DECOMPRESSION , distal clavicle excision, and extensive debridement of partial-thickness undersurface rotator cuff tear and extensive calcific deposits in the subacromial space and a.c. joint - General  Surgeon(s) and Role:    * Mable Paris, MD - Primary     Surgeon: Mable Paris   Assistants: None  Anesthesia: General endotracheal anesthesia    Procedure Detail  Estimated Blood Loss: Min         Drains: none  Blood Given: none         Specimens: none        Complications:  * No complications entered in OR log *         Disposition: PACU - hemodynamically stable.         Condition: stable    Procedure:   INDICATIONS FOR SURGERY: The patient is 78 y.o. female who has had severe right shoulder pain which has failed conservative management with injections and physical therapy. She had an ultrasound which revealed some calcifications and a likely rotator cuff tear. She was indicated for arthroscopic examination with treatment as indicated with possible rotator cuff tear, debridement, subacromial decompression, and treatment of other issues found at time of surgery is indicated. She understood risks benefits alternatives to the procedure including but not limited to risk of bleeding infection damage to neurovascular structures and risk of incomplete pain relief. She elected to go forward with surgery.  OPERATIVE FINDINGS: Examination under anesthesia: No stiffness or instability. Diagnostic Arthroscopy:  Glenoid articular cartilage: Intact Humeral head articular cartilage: Intact Labrum: Frayed Loose bodies:  None Synovitis: Moderate Articular sided rotator cuff: Extensive partial thickness supraspinatus tearing with exposed tuberosity. This was debrided extensively down to the bare tuberosity. There were some bursal sided tendon strand still intact. Bursal sided rotator cuff: Carefully probed and examined at the point of extensive undersurface tearing. There is no full-thickness tear. Coracoacromial ligament: Frayed, large hook anterior subacromial spur A.c. joint: She was noted to have extensive calcifications exhibiting from the a.c. joint. Therefore the a.c. capsule was opened exposing a significant amount of calcific deposits. Therefore it extensively debride this area and performed a distal clavicle excision as I felt this was a likely source of her pain.  DESCRIPTION OF PROCEDURE: The patient was identified in preoperative  holding area where I personally marked the operative site after  verifying site, side, and procedure with the patient.   The patient was taken back to the operating room where general anesthesia was induced without complication and was placed in the beach-chair position with the back  elevated about 60 degrees and all extremities and head and neck carefully padded and  positioned.   The right upper extremity was then prepped and  draped in a standard sterile fashion. The appropriate time-out  procedure was carried out. The patient did receive IV antibiotics  within 30 minutes of incision.   A small posterior portal incision was made and the arthroscope was introduced into the joint. An anterior portal was then established above the subscapularis using needle localization. Small cannula was placed anteriorly. Diagnostic arthroscopy was then carried out with findings as described above.  The shaver was used through the anterior portal in the intra-articular space to extensively  debride the undersurface rotator cuff tear of the supraspinatus. There is about 75% exposed  tuberosity in a small area measuring about 1/2 cm in diameter. The ArthroCare was also used to debride the exposed tuberosity. There were several small strands which were taken down but the lateral cuff insertion appeared intact and that was not taken down. It was tagged with a needle placed percutaneously for closer examination in the subacromial space. The subscapularis was completely intact. There was some moderate synovitis in the rotator interval and above the superior labrum which was debrided with the ArthroCare.  The arthroscope was then introduced into the subacromial space a standard lateral portal was established with needle localization. The shaver was used through the lateral portal to perform extensive bursectomy. Coracoacromial ligament was examined and found to be frayed. There was no full-thickness tear noted at the point of the spinal needle. Even though this is a high grade partial tear I felt that given the patient's advanced age that a takedown and repair may not be the best treatment choice for her and therefore I left the bursal rotator cuff intact. Anteriorly there was one small area with some calcific deposits in the tendon. This was debrided. Spinal needle was used to probe the rest of the tendon there was no other areas with notable calcific deposits. During the bursectomy it was noted that medially there were calcific deposits and they seem to be exuding from the a.c. joint. I opened the capsule of the a.c. joint and there was a significant amount of calcific deposits. The ArthroCare and shaver were used to debride the extensive calcific deposits. The exposed a.c. joint was noted to be severely arthritic. I took down the coracoacromial ligament exposing a moderate to large hook acromial spur. A 4 mm bur was used through the lateral portal to perform a acromioplasty in the standard fashion. The same bur was then used to take off approximately 8 mm of the undersurface of the distal  clavicle from the lateral portal. Small cannula was then moved to an anterior glenohumeral joint into the a.c. joint space and a 4 mm bur was used to complete the resection and even fashion. She was noted to have several cystic cavities in the distal clavicle as well. The excision was viewed from anterior and lateral portals and felt to be adequate. I removed as much of the calcific deposit is possible. The acromioplasty was also viewed from the lateral portal and felt to be perfectly smooth from back to front.   The arthroscopic equipment was removed from the joint and the portals were closed with 3-0 nylon in an interrupted fashion. Sterile dressings were then applied including Xeroform 4 x 4's ABDs and tape. The patient was then allowed to awaken from general anesthesia, placed in a sling, transferred to the stretcher and taken to the recovery room in stable condition.   POSTOPERATIVE PLAN: The patient will be kept overnight tonight for close observation given her advanced age and late time of surgery. She will likely be discharged home tomorrow with her family. She will then followup in one week for suture removal and wound check.

## 2011-09-21 NOTE — Anesthesia Preprocedure Evaluation (Addendum)
Anesthesia Evaluation  Patient identified by MRN, date of birth, ID band Patient awake    Reviewed: Allergy & Precautions, H&P , NPO status , Patient's Chart, lab work & pertinent test results  Airway Mallampati: II TM Distance: >3 FB Neck ROM: Full    Dental  (+) Edentulous Upper and Dental Advisory Given   Pulmonary          Cardiovascular hypertension, Pt. on medications     Neuro/Psych Anxiety Depression    GI/Hepatic   Endo/Other    Renal/GU      Musculoskeletal   Abdominal   Peds  Hematology   Anesthesia Other Findings   Reproductive/Obstetrics                           Anesthesia Physical Anesthesia Plan  ASA: II  Anesthesia Plan:    Post-op Pain Management:    Induction:   Airway Management Planned:   Additional Equipment:   Intra-op Plan:   Post-operative Plan:   Informed Consent:   Plan Discussed with:   Anesthesia Plan Comments:         Anesthesia Quick Evaluation

## 2011-09-21 NOTE — Anesthesia Procedure Notes (Signed)
Procedure Name: Intubation Date/Time: 09/21/2011 4:47 PM Performed by: Alanda Amass A Pre-anesthesia Checklist: Patient identified, Timeout performed, Emergency Drugs available, Suction available and Patient being monitored Patient Re-evaluated:Patient Re-evaluated prior to inductionOxygen Delivery Method: Circle system utilized Preoxygenation: Pre-oxygenation with 100% oxygen Intubation Type: IV induction Ventilation: Mask ventilation without difficulty Laryngoscope Size: Mac and 3 Grade View: Grade II Tube type: Oral Tube size: 7.5 mm Airway Equipment and Method: Stylet Placement Confirmation: ETT inserted through vocal cords under direct vision,  breath sounds checked- equal and bilateral and positive ETCO2 Secured at: 21 cm Tube secured with: Tape Dental Injury: Teeth and Oropharynx as per pre-operative assessment

## 2011-09-22 ENCOUNTER — Encounter (HOSPITAL_COMMUNITY): Payer: Self-pay | Admitting: General Practice

## 2011-09-22 MED ORDER — HYDROCODONE-ACETAMINOPHEN 7.5-325 MG PO TABS: 1.0000 | ORAL_TABLET | ORAL | Status: DC | PRN

## 2011-09-22 MED ORDER — OXYCODONE-ACETAMINOPHEN 5-325 MG PO TABS
1.0000 | ORAL_TABLET | ORAL | Status: DC | PRN
  Administered 2011-09-22: 2 via ORAL
  Filled 2011-09-22: qty 2

## 2011-09-22 MED ORDER — OXYCODONE-ACETAMINOPHEN 5-325 MG PO TABS: 1.0000 | ORAL_TABLET | ORAL | Status: AC | PRN

## 2011-09-22 MED ORDER — KETOROLAC TROMETHAMINE 30 MG/ML IJ SOLN
30.0000 mg | Freq: Once | INTRAMUSCULAR | Status: AC
  Administered 2011-09-22: 30 mg via INTRAMUSCULAR
  Filled 2011-09-22: qty 1

## 2011-09-22 MED ORDER — HYDROCODONE-ACETAMINOPHEN 7.5-325 MG PO TABS
1.0000 | ORAL_TABLET | ORAL | Status: DC | PRN
  Administered 2011-09-22: 2 via ORAL
  Filled 2011-09-22: qty 2

## 2011-09-22 NOTE — Discharge Instructions (Signed)

## 2011-09-22 NOTE — Progress Notes (Signed)
Pain not well controlled with norco.   Had toradol shot which is helping.  Spoke to her about Percocet allergy, she tells me this is not and allergy and just had some nausea, she would like to try percocet. Will try a dose of percocet to make sure tolerates well and will change home prescription.

## 2011-09-22 NOTE — Progress Notes (Signed)
PATIENT ID: Sherri Mcmillan  MRN: [id number]  DOB/AGE:  1932-10-07 / 78 y.o.  1 Day Post-Op Procedure(s) (LRB): SHOULDER ARTHROSCOPY WITH SUBACROMIAL DECOMPRESSION (Right)  Subjective: Pain is moderate and improving.  No c/o chest pain or SOB.      Objective: Vital signs in last 24 hours: Temp:  [96.8 F (36 C)-99.3 F (37.4 C)] 99.3 F (37.4 C) (05/29 0549) Pulse Rate:  [53-77] 67  (05/29 0549) Resp:  [11-19] 19  (05/29 0549) BP: (105-157)/(64-79) 105/67 mmHg (05/29 0549) SpO2:  [88 %-100 %] 100 % (05/29 0549) Weight:  [60.328 kg (133 lb)] 60.328 kg (133 lb) (05/28 1607)  Intake/Output from previous day: 05/28 0701 - 05/29 0700 In: 3096.3 [I.V.:2996.3; IV Piggyback:100] Out: 30 [Blood:30] Intake/Output this shift:     Basename 09/21/11 1332  HGB 13.3    Basename 09/21/11 1332  WBC 4.8  RBC 4.50  HCT 39.0  PLT 203    Basename 09/21/11 1332  NA 141  K 3.8  CL 101  CO2 29  BUN 29*  CREATININE 1.15*  GLUCOSE 88  CALCIUM 10.1   No results found for this basename: LABPT:2,INR:2 in the last 72 hours  Physical Exam: Neurovascular intact Sensation intact distally Incision: dressing C/D/I  Assessment/Plan: 1 Day Post-Op Procedure(s) (LRB): SHOULDER ARTHROSCOPY WITH SUBACROMIAL DECOMPRESSION (Right)   Advance diet D/C IV fluids D/c home today with family Increase to norco 7.5/325   , WILLIAM 09/22/2011, 7:24 AM

## 2011-09-22 NOTE — Discharge Summary (Signed)
Patient ID: Sherri Mcmillan MRN: [id number] DOB/AGE: [birth year] 78 y.o.  Admit date: 09/21/2011 Discharge date: 09/22/2011  Admission Diagnoses:  Principal Problem:  *Rotator cuff syndrome of right shoulder   Discharge Diagnoses:  Same  Past Medical History  Diagnosis Date  . Chronic pain     DJD knees, back, migranes, LLE varicose veins, and LLE neuropathy.   . Aortic stenosis     Dx ECHO 2008 , mild and aymptomatic , needs ECHO q 3-5 yrs  . GERD (gastroesophageal reflux disease)     Barrett's esophagus demonstrated on EGD 12/2010 by Dr Arlyce Dice. Needs repeat EGD 12/2011  . Chronic insomnia   . Chronic anxiety     Admission to [facility] (90s or early 2000)  for 6 weeks after mother died. Complicated again by death of her sister 2010. 2012 developed hallucinations and I refused to refill controlled meds unless she see psych which she is not agreable o  . Elevated cholesterol 10/11    LDL 155. Her 10 year risk (decreasing her age to 82 as the calculator won't go to age 62) is 21% ish. If consider her non smoker (which I wouldn't even though she says 1 pak lasts 2 weeks) risk is 12% ish. So goal for LDL is 130 or 100.  Marland Kitchen HTN (hypertension)     Controlled with 2 drug therapy  . Migraine   . Cataract   . Depression   . Gastroparesis     Demonstrated on GES 12/2010 by Dr Dalene Seltzer  . Bronchitis     hx of    Surgeries: Procedure(s): SHOULDER ARTHROSCOPY WITH SUBACROMIAL DECOMPRESSION on 09/21/2011   Consultants:    Discharged Condition: Improved  Hospital Course: Sherri Mcmillan is an 78 y.o. female who was admitted 09/21/2011 for operative treatment ofRotator cuff syndrome of right shoulder. Patient has severe unremitting pain that affects sleep, daily activities, and work/hobbies. After pre-op clearance the patient was taken to the operating room on 09/21/2011 and underwent  Procedure(s): SHOULDER ARTHROSCOPY WITH SUBACROMIAL DECOMPRESSION and distal clavicle excision.    Patient was given  perioperative antibiotics: Anti-infectives     Start     Dose/Rate Route Frequency Ordered Stop   09/21/11 2300   ceFAZolin (ANCEF) IVPB 1 g/50 mL premix        1 g 100 mL/hr over 30 Minutes Intravenous Every 6 hours 09/21/11 2018 09/22/11 1659           Patient was given sequential compression devices, early ambulation, to prevent DVT.  Patient benefited maximally from hospital stay and there were no complications.    Recent vital signs: Patient Vitals for the past 24 hrs:  BP Temp Temp src Pulse Resp SpO2  09/22/11 0549 105/67 mmHg 99.3 F (37.4 C) - 67  19  100 %  09/22/11 0214 138/78 mmHg 98.6 F (37 C) - 65  19  98 %  09/21/11 2021 139/75 mmHg 98.7 F (37.1 C) - 67  18  96 %  09/21/11 2000 124/79 mmHg 98.4 F (36.9 C) - 64  11  100 %  09/21/11 1945 144/66 mmHg - - 68  17  96 %  09/21/11 1930 146/74 mmHg - - 66  15  99 %  09/21/11 1915 - - - 68  16  99 %  09/21/11 1912 136/75 mmHg - - 65  14  98 %  09/21/11 1900 157/78 mmHg - - 72  17  97 %  09/21/11 1845 - - - 77  14  88 %  09/21/11 1840 119/64 mmHg 96.8 F (36 C) - 74  19  90 %  09/21/11 1348 136/71 mmHg 98.2 F (36.8 C) Oral 53  18  100 %     Recent laboratory studies:  Basename 09/21/11 1332  WBC 4.8  HGB 13.3  HCT 39.0  PLT 203  NA 141  K 3.8  CL 101  CO2 29  BUN 29*  CREATININE 1.15*  GLUCOSE 88  INR --  CALCIUM 10.1     Discharge Medications:   Medication List  As of 09/22/2011  7:29 AM   STOP taking these medications         acetaminophen 325 MG tablet         TAKE these medications         amLODipine 10 MG tablet   Commonly known as: NORVASC   Take 1 tablet (10 mg total) by mouth daily.      citalopram 40 MG tablet   Commonly known as: CELEXA   Take 1 tablet (40 mg total) by mouth daily.      furosemide 40 MG tablet   Commonly known as: LASIX   Take 1.5 tablet by mouth daily      hydrochlorothiazide 12.5 MG tablet   Commonly known as: HYDRODIURIL   Take 1 tablet (12.5 mg  total) by mouth daily.      HYDROcodone-acetaminophen 7.5-325 MG per tablet   Commonly known as: NORCO   Take 1-2 tablets by mouth every 4 (four) hours as needed for pain.      naproxen 500 MG tablet   Commonly known as: NAPROSYN   Take 1 tablet (500 mg total) by mouth 2 (two) times daily as needed.      pantoprazole 40 MG tablet   Commonly known as: PROTONIX   Take 1 tablet (40 mg total) by mouth 2 (two) times daily.      promethazine 12.5 MG tablet   Commonly known as: PHENERGAN   Take 12.5 mg by mouth every 6 (six) hours as needed. For nausea.      topiramate 50 MG tablet   Commonly known as: TOPAMAX   Take 1 tablet (50 mg total) by mouth 2 (two) times daily.            Diagnostic Studies: No results found.  Disposition: 01-Home or Self Care  Discharge Orders    Future Appointments: Provider: Department: Dept Phone: Center:   10/07/2011 1:45 PM Dede Query, MD [facility] [phone] [facility]     Future Orders Please Complete By Expires   Diet - low sodium heart healthy      Call MD / Call 911      Comments:   If you experience chest pain or shortness of breath, CALL 911 and be transported to the hospital emergency room.  If you develope a fever above 101 F, pus (white drainage) or increased drainage or redness at the wound, or calf pain, call your surgeon's office.   Constipation Prevention      Comments:   Drink plenty of fluids.  Prune juice may be helpful.  You may use a stool softener, such as Colace (over the counter) 100 mg twice a day.  Use MiraLax (over the counter) for constipation as needed.   Increase activity slowly as tolerated      Driving restrictions      Comments:   No driving for 2 weeks      Follow-up Information  Follow up with Mable Paris, MD in 1 week.   Contact information:   [facility] [address], [address] [place] [address] [phone]            Signed: Mable Paris 09/22/2011, 7:29 AM

## 2011-09-22 NOTE — Progress Notes (Signed)
UR COMPLETED  

## 2011-10-07 ENCOUNTER — Encounter: Payer: Medicare Other | Admitting: Internal Medicine

## 2011-10-07 ENCOUNTER — Ambulatory Visit (INDEPENDENT_AMBULATORY_CARE_PROVIDER_SITE_OTHER): Payer: Medicare Other | Admitting: Internal Medicine

## 2011-10-07 ENCOUNTER — Encounter: Payer: Self-pay | Admitting: Internal Medicine

## 2011-10-07 VITALS — BP 132/70 | HR 64 | Temp 97.8°F | Wt 128.9 lb

## 2011-10-07 DIAGNOSIS — M7520 Bicipital tendinitis, unspecified shoulder: Secondary | ICD-10-CM

## 2011-10-07 DIAGNOSIS — I10 Essential (primary) hypertension: Secondary | ICD-10-CM

## 2011-10-07 DIAGNOSIS — G894 Chronic pain syndrome: Secondary | ICD-10-CM

## 2011-10-07 DIAGNOSIS — M7521 Bicipital tendinitis, right shoulder: Secondary | ICD-10-CM

## 2011-10-07 DIAGNOSIS — F4321 Adjustment disorder with depressed mood: Secondary | ICD-10-CM

## 2011-10-07 DIAGNOSIS — R51 Headache: Secondary | ICD-10-CM

## 2011-10-07 DIAGNOSIS — Z0000 Encounter for general adult medical examination without abnormal findings: Secondary | ICD-10-CM

## 2011-10-07 NOTE — Assessment & Plan Note (Signed)
Patient is complaining of pain in her shoulder which is now finally controlled by oxycodone prescribed to her by the orthopedic surgeon.

## 2011-10-07 NOTE — Patient Instructions (Addendum)
Grief Reaction Grief is a normal response to the death of someone close to you. Feelings of fear, anger, and guilt can affect almost everyone who loses someone they love. Symptoms of depression are also common. These include problems with sleep, loss of appetite, and lack of energy. These grief reaction symptoms often last for weeks to months after a loss. They may also return during special times that remind you of the person you lost, such as an anniversary or birthday. Anxiety, insomnia, irritability, and deep depression may last beyond the period of normal grief. If you experience these feelings for 6 months or longer, you may have clinical depression. Clinical depression requires further medical attention. If you think that you have clinical depression, you should contact your caregiver. If you have a history of depression and or a family history of depression, you are at greater risk of clinical depression. You are also at greater risk of developing clinical depression if the loss was traumatic or the loss was of someone with whom you had unresolved issues.  A grief reaction can become complicated by being blocked. This means being unable to cry or express extreme emotions. This may prolong the grieving period and worsen the emotional effects of the loss. Mourning is a natural event in human life. A healthy grief reaction is one that is not blocked . It requires a time of sadness and readjustment.It is very important to share your sorrow and fear with others, especially close friends and family. Professional counselors and clergy can also help you process your grief. Document Released: 04/12/2005 Document Revised: 04/01/2011 Document Reviewed: 12/21/2005 [facility] Patient Information 2012 [place], [place].   Please call our clinic if your mood remains depressed and you feel that you would need phychiatric help.

## 2011-10-07 NOTE — Assessment & Plan Note (Signed)
Patient was given instructions and phone number to get support as needed. She denied any suicidal or homicidal thoughts at this time. She also said that she will be fine as she has a friend who she can talk to.

## 2011-10-07 NOTE — Assessment & Plan Note (Signed)
Patient complains of headaches which are worse due to recent death of her 2 friends. Interestingly patient says that she was told not to take topamax and ibuprofen which was prescribed by her PCP. She requests me that I should take out those medication from her list. Patient denies any other NSAIDS at this time and said that she will get mor oxycodone from her orthopedics for the headaches. I explained that Oxycodone is not recommended for headaches but she does not want to take any other medications.

## 2011-10-07 NOTE — Assessment & Plan Note (Signed)
Was given shingles vaccine prescription.

## 2011-10-07 NOTE — Progress Notes (Signed)
  Subjective:    Patient ID: Sherri Mcmillan, female    DOB: [birth year], 78 y.o.   MRN: [id number]  HPI Patient is a 78 year old man with PMH significant for HTN, GERD, rotator cuff tear of right shoulder, recurrent headaches and chronic anxiety.  Patient is here today for a regular follow up.   She complains of feeling depressed as she had 2 of her friends die this morning. She feels that her headaches have increased in intensity since this morning. She is not suicidal or homicidal but is just scared that it could have been her. She says that she has somebody to talk to(her friend Sherri Mcmillan). She thinks she will be fine after some time.  Her BP is well controlled today. She is on both HCTZ and lasix along with Amlodipine.  She had surgery on her right shoulder and is healing well. She goes back see her orthopedics physician on 19th June 2013.  Patient is due for shingles vaccine and was given prescription today.  No other complaints.    Review of Systems  Constitutional: Negative for fever, activity change and appetite change.  HENT: Negative for sore throat.   Respiratory: Negative for cough and shortness of breath.   Cardiovascular: Positive for leg swelling. Negative for chest pain.  Gastrointestinal: Negative for nausea, abdominal pain, diarrhea, constipation and abdominal distention.  Genitourinary: Negative for frequency, hematuria and difficulty urinating.  Musculoskeletal: Positive for arthralgias.  Neurological: Positive for headaches. Negative for dizziness.  Psychiatric/Behavioral: Positive for dysphoric mood and decreased concentration. Negative for suicidal ideas and behavioral problems.       Patient is tearful due to recent death of her 2 friends.       Objective:   Physical Exam  Constitutional: She is oriented to person, place, and time. She appears well-developed and well-nourished.  HENT:  Head: Normocephalic and atraumatic.  Eyes: Conjunctivae and EOM are  normal. Pupils are equal, round, and reactive to light. No scleral icterus.  Neck: Normal range of motion. Neck supple. No JVD present. No thyromegaly present.  Cardiovascular: Normal rate, regular rhythm, normal heart sounds and intact distal pulses.  Exam reveals no gallop and no friction rub.   No murmur heard. Pulmonary/Chest: Effort normal and breath sounds normal. No respiratory distress. She has no wheezes. She has no rales.  Abdominal: Soft. Bowel sounds are normal. She exhibits no distension and no mass. There is no tenderness. There is no rebound and no guarding.  Musculoskeletal: She exhibits edema.       Right shoulder is in a sling. Normal range of motion at all other 3 extremities. 1+ pitting edema left>right  Lymphadenopathy:    She has no cervical adenopathy.  Neurological: She is alert and oriented to person, place, and time.  Psychiatric: She has a normal mood and affect. Her behavior is normal.          Assessment & Plan:

## 2011-10-07 NOTE — Assessment & Plan Note (Signed)
Patient had a shoulder repair surgery last month. Follow up pending on June 18th.

## 2011-10-07 NOTE — Assessment & Plan Note (Signed)
Well controlled. HCTZ was taken out of the med list. Patient refused any blood draws today.

## 2011-10-20 ENCOUNTER — Other Ambulatory Visit: Payer: Self-pay | Admitting: *Deleted

## 2011-10-20 DIAGNOSIS — I872 Venous insufficiency (chronic) (peripheral): Secondary | ICD-10-CM

## 2011-10-20 NOTE — Telephone Encounter (Signed)
Pt states pharmacy keeps sending her Hydrochlorothiazide .

## 2011-10-20 NOTE — Telephone Encounter (Signed)
Called [facility] pharmacy aware HCTZ was d/c on 10/07/11 by Dr Eben Burow.

## 2011-10-21 MED ORDER — FUROSEMIDE 40 MG PO TABS: ORAL_TABLET | ORAL | Status: DC

## 2011-10-21 NOTE — Telephone Encounter (Signed)
Pt aware Dr Dierdre Searles suggest lab and front desk pool will call pt and sch appt. Flag sent to front pool desk.

## 2011-11-03 ENCOUNTER — Other Ambulatory Visit (INDEPENDENT_AMBULATORY_CARE_PROVIDER_SITE_OTHER): Payer: Medicare Other

## 2011-11-03 DIAGNOSIS — I10 Essential (primary) hypertension: Secondary | ICD-10-CM

## 2011-11-03 LAB — BASIC METABOLIC PANEL WITH GFR
BUN: 19 mg/dL (ref 6–23)
Calcium: 9.9 mg/dL (ref 8.4–10.5)
Chloride: 105 mEq/L (ref 96–112)
Creat: 0.9 mg/dL (ref 0.50–1.10)
GFR, Est African American: 71 mL/min
GFR, Est Non African American: 62 mL/min
Glucose, Bld: 110 mg/dL — ABNORMAL HIGH (ref 70–99)
Potassium: 4.1 mEq/L (ref 3.5–5.3)

## 2011-11-05 ENCOUNTER — Other Ambulatory Visit: Payer: Self-pay | Admitting: *Deleted

## 2011-11-05 DIAGNOSIS — I872 Venous insufficiency (chronic) (peripheral): Secondary | ICD-10-CM

## 2011-11-08 MED ORDER — FUROSEMIDE 40 MG PO TABS: ORAL_TABLET | ORAL | Status: DC

## 2011-12-07 ENCOUNTER — Encounter: Payer: Self-pay | Admitting: Internal Medicine

## 2011-12-08 ENCOUNTER — Other Ambulatory Visit: Payer: Self-pay | Admitting: *Deleted

## 2011-12-08 DIAGNOSIS — F419 Anxiety disorder, unspecified: Secondary | ICD-10-CM

## 2011-12-09 MED ORDER — CITALOPRAM HYDROBROMIDE 40 MG PO TABS: 40.0000 mg | ORAL_TABLET | Freq: Every day | ORAL | Status: DC

## 2012-01-25 ENCOUNTER — Ambulatory Visit (INDEPENDENT_AMBULATORY_CARE_PROVIDER_SITE_OTHER): Payer: Medicare Other | Admitting: Internal Medicine

## 2012-01-25 ENCOUNTER — Encounter: Payer: Self-pay | Admitting: Internal Medicine

## 2012-01-25 VITALS — BP 132/79 | HR 96 | Temp 98.0°F | Ht 64.0 in | Wt 127.4 lb

## 2012-01-25 DIAGNOSIS — I10 Essential (primary) hypertension: Secondary | ICD-10-CM

## 2012-01-25 DIAGNOSIS — I872 Venous insufficiency (chronic) (peripheral): Secondary | ICD-10-CM

## 2012-01-25 DIAGNOSIS — K219 Gastro-esophageal reflux disease without esophagitis: Secondary | ICD-10-CM

## 2012-01-25 DIAGNOSIS — M7520 Bicipital tendinitis, unspecified shoulder: Secondary | ICD-10-CM

## 2012-01-25 DIAGNOSIS — M7521 Bicipital tendinitis, right shoulder: Secondary | ICD-10-CM

## 2012-01-25 DIAGNOSIS — Z0000 Encounter for general adult medical examination without abnormal findings: Secondary | ICD-10-CM

## 2012-01-25 DIAGNOSIS — R51 Headache: Secondary | ICD-10-CM

## 2012-01-25 DIAGNOSIS — G8929 Other chronic pain: Secondary | ICD-10-CM

## 2012-01-25 DIAGNOSIS — F411 Generalized anxiety disorder: Secondary | ICD-10-CM

## 2012-01-25 DIAGNOSIS — F419 Anxiety disorder, unspecified: Secondary | ICD-10-CM

## 2012-01-25 MED ORDER — TRAMADOL HCL 50 MG PO TABS: 50.0000 mg | ORAL_TABLET | ORAL | Status: DC | PRN

## 2012-01-25 MED ORDER — AMLODIPINE BESYLATE 10 MG PO TABS: 10.0000 mg | ORAL_TABLET | Freq: Every day | ORAL | Status: DC

## 2012-01-25 MED ORDER — IBUPROFEN 800 MG PO TABS: 800.0000 mg | ORAL_TABLET | Freq: Three times a day (TID) | ORAL | Status: DC | PRN

## 2012-01-25 MED ORDER — ACETAMINOPHEN 650 MG PO TABS
650.0000 mg | ORAL_TABLET | ORAL | Status: DC | PRN
Start: 2012-01-25 — End: 2012-01-25

## 2012-01-25 NOTE — Assessment & Plan Note (Signed)
Stable on protonix

## 2012-01-25 NOTE — Assessment & Plan Note (Addendum)
Stable on Lasix 60 mg qd. -check BMET at f/u

## 2012-01-25 NOTE — Patient Instructions (Addendum)
It was nice meeting you today. You received the flu shot today and I have sent a prescription to [facility] for the shingles [facility]. I have refilled your blood pressure medicine.  I have also prescribed tramadol for your headaches.  If your headaches do not get better or you start to get nausea, vomiting, changes in your vision or chest pain, please call the clinic for further advice. You will return in 2-3 months for a follow-up.

## 2012-01-25 NOTE — Progress Notes (Signed)
  Subjective:    Patient ID: Sherri Mcmillan, female    DOB: 1932-08-06, 78 y.o.   MRN: [id number]  HPI Presents today for refill of her "blood pressure" medicine.  States that she has been doing well since last visit and had surgery on her right shoulder in July.  Reports continued mild headaches for which she takes advil (2x200 mg tabs) and inquiring about a medicine that she would not have to take 'so many pills'. Has prescription for Vicodin from Ortho.   Review of Systems  Constitutional: Negative for fatigue.  Eyes: Negative for visual disturbance.  Respiratory: Negative for shortness of breath.   Cardiovascular: Negative for chest pain.  Musculoskeletal: Negative for arthralgias.  Neurological: Positive for headaches. Negative for dizziness, weakness and light-headedness.  Psychiatric/Behavioral: Negative for dysphoric mood.       Objective:   Physical Exam  Constitutional: She is oriented to person, place, and time. She appears well-developed and well-nourished. No distress.  HENT:  Head: Normocephalic and atraumatic.  Eyes: Conjunctivae normal and EOM are normal. Pupils are equal, round, and reactive to light.  Neck: Normal range of motion. Neck supple.  Cardiovascular: Normal rate and regular rhythm.   Murmur heard. Pulmonary/Chest: Effort normal and breath sounds normal.  Abdominal: Soft. Bowel sounds are normal.  Neurological: She is alert and oriented to person, place, and time.  Skin: Skin is warm and dry.  Psychiatric: She has a normal mood and affect. Her behavior is normal. Judgment and thought content normal.          Assessment & Plan:  1. Hypertension: Norvasc refill 2. Headache: Tramadol 50 q4prn 3. GERD: cont ppi 4. Chronic anxiety: not on anxiolytics 5. Venous insufficiency: cont Lasix 60 qd 6. Prevent Health: flu shot today, [facility], f/u PCP in 2 months with creatinine recheck  See detail problem list

## 2012-01-25 NOTE — Assessment & Plan Note (Signed)
Requests refill for [facility] because she is currently out of medicine and usually uses mail order.  She is still on lasix 40 mg qd. Bp at goal today 139/79.

## 2012-01-25 NOTE — Assessment & Plan Note (Signed)
S/p right rotator cuff repair in July 2013 per Dr. Ave Filter of Ortho.  Pt reports full recovery with pain today.

## 2012-01-25 NOTE — Assessment & Plan Note (Signed)
Has a new prescription of Vicodin from Ortho which pt states does not help her headaches.  Chronic pain currently under control.

## 2012-01-25 NOTE — Assessment & Plan Note (Signed)
Flu shot received today.  [facility].  Pt and niece reports that they will get this today.

## 2012-01-25 NOTE — Assessment & Plan Note (Signed)
Reports no longer taking Celexa because of associated skin changes (hypopigmentations) that she attributes to this medication.

## 2012-01-25 NOTE — Assessment & Plan Note (Addendum)
Reports taking Advil  (400 mg) to help with chronic headaches.  States that Vicodin prescribed by Ortho do not help headaches. Deferred NSAIDS given last creatinine of 0.9-1.2.  Unspecified 'allergy' to Darvocet (propoxyphen/APAP) noted in EPIC. Will prescribe Tramadol 50 mg q4h prn to break headache cycle in the short term. If pt starts to have neuro/pscyh side effects ie hallucinations or anxiety,  would change therapy given her h/o hallucinations and anxiety (no seizures). F/u with PCP Dr. Dierdre Searles in December/January. FYI updated.

## 2012-02-25 ENCOUNTER — Other Ambulatory Visit: Payer: Self-pay | Admitting: Internal Medicine

## 2012-02-28 NOTE — Telephone Encounter (Signed)
Sent flaf to front desk pool about pt needing appt with Dr Dierdre Searles in 1-2 months regarding h/a. Message left on home phone ID recording.

## 2012-03-02 ENCOUNTER — Other Ambulatory Visit: Payer: Self-pay | Admitting: Internal Medicine

## 2012-03-27 ENCOUNTER — Encounter: Payer: Self-pay | Admitting: Internal Medicine

## 2012-03-27 ENCOUNTER — Ambulatory Visit (INDEPENDENT_AMBULATORY_CARE_PROVIDER_SITE_OTHER): Payer: Medicare Other | Admitting: Internal Medicine

## 2012-03-27 VITALS — BP 166/89 | HR 63 | Temp 97.8°F | Ht 60.0 in | Wt 135.4 lb

## 2012-03-27 DIAGNOSIS — I872 Venous insufficiency (chronic) (peripheral): Secondary | ICD-10-CM

## 2012-03-27 DIAGNOSIS — K219 Gastro-esophageal reflux disease without esophagitis: Secondary | ICD-10-CM

## 2012-03-27 DIAGNOSIS — F419 Anxiety disorder, unspecified: Secondary | ICD-10-CM

## 2012-03-27 DIAGNOSIS — I10 Essential (primary) hypertension: Secondary | ICD-10-CM

## 2012-03-27 DIAGNOSIS — G8929 Other chronic pain: Secondary | ICD-10-CM

## 2012-03-27 DIAGNOSIS — R51 Headache: Secondary | ICD-10-CM

## 2012-03-27 MED ORDER — TRAMADOL HCL 50 MG PO TABS: 50.0000 mg | ORAL_TABLET | Freq: Four times a day (QID) | ORAL | Status: DC | PRN

## 2012-03-27 MED ORDER — PANTOPRAZOLE SODIUM 40 MG PO TBEC: 40.0000 mg | DELAYED_RELEASE_TABLET | Freq: Every day | ORAL | Status: DC

## 2012-03-27 MED ORDER — CITALOPRAM HYDROBROMIDE 40 MG PO TABS: 40.0000 mg | ORAL_TABLET | Freq: Every day | ORAL | Status: DC

## 2012-03-27 MED ORDER — AMLODIPINE BESYLATE 10 MG PO TABS: 10.0000 mg | ORAL_TABLET | Freq: Every day | ORAL | Status: DC

## 2012-03-27 MED ORDER — FUROSEMIDE 40 MG PO TABS: ORAL_TABLET | ORAL | Status: DC

## 2012-03-27 NOTE — Progress Notes (Signed)
  Subjective:    Patient ID: Sherri Mcmillan, female    DOB: [birth year], 78 y.o.   MRN: [id number]  HPI Patient presented today at her niece's doctor's appointment Sherri Mcmillan who is a patient of mine. During her niece's evaluation she was requesting medical evaluation/her refills thus was given an appointment today as well. No complaints. Patient states that she has been off her blood pressure medicine and "stomach medicine"  Review of Systems As per history of present illness    Objective:   Physical Exam  Constitutional: She is oriented to person, place, and time. She appears well-developed and well-nourished. No distress.  HENT:  Head: Normocephalic and atraumatic.  Neck: Normal range of motion. Neck supple.  Cardiovascular: Normal rate, regular rhythm, normal heart sounds and intact distal pulses.   Pulmonary/Chest: Effort normal and breath sounds normal. No respiratory distress. She has no wheezes. She has no rales.  Abdominal: Soft. Bowel sounds are normal. There is no tenderness.  Neurological: She is alert and oriented to person, place, and time.  Skin: Skin is warm and dry.  Psychiatric: She has a normal mood and affect.          Assessment & Plan:  1. Hypertension: above goal today at 166/89 with pulse 63 bpm.  Ran out of amlodipine -refilled amlodipine 10 mg qd  2. Chronic pain and headaches: refilled Tramadol 50 mg q6h prn pain  3. Venous Insufficiency: pt states that she takes 1 pill (40 mg) of Lasix NOT 1.5 pill (60 mg) as prescribed  4. Pt is scheduled to f/u with her PCP 04/06/2012, she should have her bp reassessed at that time.  5. Preventative Care: [facility] [address],

## 2012-04-06 ENCOUNTER — Encounter: Payer: Medicare Other | Admitting: Internal Medicine

## 2012-05-16 ENCOUNTER — Emergency Department (HOSPITAL_COMMUNITY): Payer: Medicare Other

## 2012-05-16 ENCOUNTER — Encounter (HOSPITAL_COMMUNITY): Payer: Self-pay | Admitting: Cardiology

## 2012-05-16 ENCOUNTER — Emergency Department (HOSPITAL_COMMUNITY)
Admission: EM | Admit: 2012-05-16 | Discharge: 2012-05-16 | Disposition: A | Discharge status: Home / Self Care | Payer: Medicare Other | Attending: Emergency Medicine | Admitting: Emergency Medicine

## 2012-05-16 DIAGNOSIS — I10 Essential (primary) hypertension: Secondary | ICD-10-CM | POA: Insufficient documentation

## 2012-05-16 DIAGNOSIS — S4980XA Other specified injuries of shoulder and upper arm, unspecified arm, initial encounter: Secondary | ICD-10-CM | POA: Insufficient documentation

## 2012-05-16 DIAGNOSIS — G4700 Insomnia, unspecified: Secondary | ICD-10-CM | POA: Insufficient documentation

## 2012-05-16 DIAGNOSIS — S0003XA Contusion of scalp, initial encounter: Secondary | ICD-10-CM | POA: Insufficient documentation

## 2012-05-16 DIAGNOSIS — Z8679 Personal history of other diseases of the circulatory system: Secondary | ICD-10-CM | POA: Insufficient documentation

## 2012-05-16 DIAGNOSIS — F329 Major depressive disorder, single episode, unspecified: Secondary | ICD-10-CM | POA: Insufficient documentation

## 2012-05-16 DIAGNOSIS — S46909A Unspecified injury of unspecified muscle, fascia and tendon at shoulder and upper arm level, unspecified arm, initial encounter: Secondary | ICD-10-CM | POA: Insufficient documentation

## 2012-05-16 DIAGNOSIS — K219 Gastro-esophageal reflux disease without esophagitis: Secondary | ICD-10-CM | POA: Insufficient documentation

## 2012-05-16 DIAGNOSIS — IMO0002 Reserved for concepts with insufficient information to code with codable children: Secondary | ICD-10-CM | POA: Insufficient documentation

## 2012-05-16 DIAGNOSIS — Z9089 Acquired absence of other organs: Secondary | ICD-10-CM | POA: Insufficient documentation

## 2012-05-16 DIAGNOSIS — T148XXA Other injury of unspecified body region, initial encounter: Secondary | ICD-10-CM

## 2012-05-16 DIAGNOSIS — Y9289 Other specified places as the place of occurrence of the external cause: Secondary | ICD-10-CM | POA: Insufficient documentation

## 2012-05-16 DIAGNOSIS — Z87891 Personal history of nicotine dependence: Secondary | ICD-10-CM | POA: Insufficient documentation

## 2012-05-16 DIAGNOSIS — G8929 Other chronic pain: Secondary | ICD-10-CM | POA: Insufficient documentation

## 2012-05-16 DIAGNOSIS — E7800 Pure hypercholesterolemia, unspecified: Secondary | ICD-10-CM | POA: Insufficient documentation

## 2012-05-16 DIAGNOSIS — F411 Generalized anxiety disorder: Secondary | ICD-10-CM | POA: Insufficient documentation

## 2012-05-16 DIAGNOSIS — W19XXXA Unspecified fall, initial encounter: Secondary | ICD-10-CM

## 2012-05-16 DIAGNOSIS — S1093XA Contusion of unspecified part of neck, initial encounter: Secondary | ICD-10-CM | POA: Insufficient documentation

## 2012-05-16 DIAGNOSIS — Z8669 Personal history of other diseases of the nervous system and sense organs: Secondary | ICD-10-CM | POA: Insufficient documentation

## 2012-05-16 DIAGNOSIS — Z9889 Other specified postprocedural states: Secondary | ICD-10-CM | POA: Insufficient documentation

## 2012-05-16 DIAGNOSIS — F3289 Other specified depressive episodes: Secondary | ICD-10-CM | POA: Insufficient documentation

## 2012-05-16 DIAGNOSIS — W1809XA Striking against other object with subsequent fall, initial encounter: Secondary | ICD-10-CM | POA: Insufficient documentation

## 2012-05-16 DIAGNOSIS — Z8709 Personal history of other diseases of the respiratory system: Secondary | ICD-10-CM | POA: Insufficient documentation

## 2012-05-16 DIAGNOSIS — Y9301 Activity, walking, marching and hiking: Secondary | ICD-10-CM | POA: Insufficient documentation

## 2012-05-16 DIAGNOSIS — Z8719 Personal history of other diseases of the digestive system: Secondary | ICD-10-CM | POA: Insufficient documentation

## 2012-05-16 MED ORDER — MORPHINE SULFATE 4 MG/ML IJ SOLN
4.0000 mg | Freq: Once | INTRAMUSCULAR | Status: AC
  Administered 2012-05-16: 4 mg via INTRAMUSCULAR
  Filled 2012-05-16: qty 1

## 2012-05-16 MED ORDER — HYDROCODONE-ACETAMINOPHEN 5-325 MG PO TABS: 0.5000 | ORAL_TABLET | ORAL | Status: DC | PRN

## 2012-05-16 NOTE — ED Notes (Signed)
Pt reports she was walking downtown yesterday for the parade and fell on he abd on a curb. Denies hitting her head. C/o headache and neck in her neck at this time. Also some pain around the left side of her abd.

## 2012-05-17 NOTE — ED Provider Notes (Signed)
History     CSN: [id number]  Arrival date & time 05/16/12  4098   First MD Initiated Contact with Patient 05/16/12 [phone]      Chief Complaint  Patient presents with  . Fall    (Consider location/radiation/quality/duration/timing/severity/associated sxs/prior treatment) HPI 79 y/o  Female presents to the ED with cc pain after fall. Patient attended a parade yesterday and fell backward off of a curb onto her back left side. C/o occipital pain, neck pain, left shoulder pain and lower back pain. Denies LOC. Patient is ambulatory.  Past Medical History  Diagnosis Date  . Chronic pain     DJD knees, back, migranes, LLE varicose veins, and LLE neuropathy.   . Aortic stenosis     Dx ECHO 2008 , mild and aymptomatic , needs ECHO q 3-5 yrs  . GERD (gastroesophageal reflux disease)     Barrett's esophagus demonstrated on EGD 12/2010 by Dr Arlyce Dice. Needs repeat EGD 12/2011  . Chronic insomnia   . Chronic anxiety     Admission to [facility], In (90s or early 2000)  for 6 weeks after mother died. Complicated again by death of her sister 2010. 2012 developed hallucinations and I refused to refill controlled meds unless she see psych which she is not agreable o  . Elevated cholesterol 10/11    LDL 155. Her 10 year risk (decreasing her age to 19 as the calculator won't go to age 36) is 21% ish. If consider her non smoker (which I wouldn't even though she says 1 pak lasts 2 weeks) risk is 12% ish. So goal for LDL is 130 or 100.  Marland Kitchen HTN (hypertension)     Controlled with 2 drug therapy  . Migraine   . Cataract   . Depression   . Gastroparesis     Demonstrated on GES 12/2010 by Dr Dalene Seltzer  . Bronchitis     hx of    Past Surgical History  Procedure Date  . Direct laryngoscopy  September 2008     preoperative diagnosis hoarseness with anterior right vocal cord lesion -  direct laryngoscopy and excisional biopsy of right anterior vocal cord lesion done by Dr. Ezzard Standing  . Meniscectomy  July 2002     preoperative  diagnosis torn medial meniscus right knee, partial medial meniscectomy, debridement chondroplasty patellofemoral joint, done by Dr. Madelon Lips  . Abdominal hysterectomy   . Back surgery   . Hemorrhoid surgery   . Appendectomy   . Cataract extraction     left eye  . Polypectomy 2000    Dr.Magod  . Colonoscopy 2000&2005    Dr.Magod  . Esophagogastroduodenoscopy 11/2010  . Knee arthroscopy   . Eye surgery   . Rotator cuff repair 09/21/2011    rt shoulder    Family History  Problem Relation Age of Onset  . Stroke Neg Hx   . Cancer Neg Hx   . Colon cancer Neg Hx   . Anesthesia problems Neg Hx   . Hypotension Neg Hx   . Malignant hyperthermia Neg Hx   . Pseudochol deficiency Neg Hx   . Heart disease Mother   . Hypertension Mother     History  Substance Use Topics  . Smoking status: Former Smoker    Types: Cigarettes    Quit date: 06/01/2010  . Smokeless tobacco: Never Used  . Alcohol Use: No    OB History    Grav Para Term Preterm Abortions TAB SAB Ect Mult Living  Review of Systems Ten systems reviewed and are negative for acute change, except as noted in the HPI.   Allergies  Aspirin  Home Medications   Current Outpatient Rx  Name  Route  Sig  Dispense  Refill  . AMLODIPINE BESYLATE 10 MG PO TABS   Oral   Take 1 tablet (10 mg total) by mouth daily.   30 tablet   5   . CITALOPRAM HYDROBROMIDE 40 MG PO TABS   Oral   Take 1 tablet (40 mg total) by mouth daily.   30 tablet   11   . FUROSEMIDE 40 MG PO TABS      Take 1.5 tablet by mouth daily   180 tablet   1   . IBUPROFEN 200 MG PO TABS   Oral   Take 400 mg by mouth every 6 (six) hours as needed. For pain         . PANTOPRAZOLE SODIUM 40 MG PO TBEC   Oral   Take 1 tablet (40 mg total) by mouth daily.   60 tablet   PRN   . HYDROCODONE-ACETAMINOPHEN 5-325 MG PO TABS   Oral   Take 0.5-1 tablets by mouth every 4 (four) hours as needed for pain.   10 tablet   0     BP 135/78   Pulse 76  Temp 98.3 F (36.8 C) (Oral)  Resp 18  SpO2 100%  LMP 04/26/1950  Physical Exam  Constitutional: She is oriented to person, place, and time. She appears well-developed and well-nourished. No distress.  HENT:  Head: Normocephalic and atraumatic.  Eyes: Conjunctivae normal are normal. No scleral icterus.  Neck: Normal range of motion.  Cardiovascular: Normal rate, regular rhythm and normal heart sounds.  Exam reveals no gallop and no friction rub.   No murmur heard. Pulmonary/Chest: Effort normal and breath sounds normal. No respiratory distress.  Abdominal: Soft. Bowel sounds are normal. She exhibits no distension and no mass. There is no tenderness. There is no guarding.  Musculoskeletal:       TTP left occiput, cervical paraspinal, left shoulder ahd lumbar spinous processes.  No ecchiymosis, swelling or deformity.  NV intact.   Neurological: She is alert and oriented to person, place, and time.  Skin: Skin is warm and dry. She is not diaphoretic.    ED Course  Procedures (including critical care time)  Labs Reviewed - No data to display Dg Lumbar Spine Complete  05/16/2012  *RADIOLOGY REPORT*  Clinical Data: Recent traumatic injury with low back pain  LUMBAR SPINE - COMPLETE 4+ VIEW  Comparison: None.  Findings: Five lumbar type vertebral bodies are well visualized. Mild osteophytic changes are seen.  A mild scoliosis concave to the left is noted.  Vertebral body height is well-maintained.  No compression deformities are seen.  Disc space narrowing is noted at L4-5.  IMPRESSION: Mild degenerative change without acute abnormality.   Original Report Authenticated By: Alcide Clever, M.D.    Dg Shoulder Right  05/16/2012  *RADIOLOGY REPORT*  Clinical Data: Fall.  Right shoulder pain.  RIGHT SHOULDER - 2+ VIEW  Comparison: 08/05/2011  Findings: Degenerative glenohumeral spurring noted.  Absence of the distal clavicle, compatible with Mumford procedure or osteolysis. Right  subacromial decompression.  The acromial undersurface is type 2 (curved)  No fracture or acute bony findings.  IMPRESSION:  1.  Interval subacromial decompression and distal clavicular excision. 2.  Mild degenerative glenohumeral spurring.   Original Report Authenticated By: Gaylyn Rong, M.D.  Ct Head Wo Contrast  05/16/2012  *RADIOLOGY REPORT*  Clinical Data: Fall hitting head.  High blood pressure.  CT HEAD WITHOUT CONTRAST  Technique:  Contiguous axial images were obtained from the base of the skull through the vertex without contrast.  Comparison: 05/14/2008.  Findings: No skull fracture or intracranial hemorrhage.  Small vessel disease type changes without CT evidence of large acute infarct.  Small ossific structure next to the pterion may represent a small meningioma without mass effect and unchanged.  Otherwise no evidence of intracranial mass lesion noted on this unenhanced exam.  Sella appears partially empty.  Orbital structures unremarkable.  Nonspecific subcutaneous lesion in the right occipital region without significant change.  Vascular calcifications.  Visualized sinuses and mastoid air cells are clear.  IMPRESSION: No skull fracture or intracranial hemorrhage.   Original Report Authenticated By: Lacy Duverney, M.D.      1. Fall   2. Hematoma       MDM  Elderly female c/o pain after fall.  Imaging is negative and patient is more comfortable after pain control.  Patient is ambulatory and without neurologic deficits.   At this time there does not appear to be any evidence of an acute emergency medical condition and the patient appears stable for discharge with appropriate outpatient follow up.Diagnosis was discussed with patient who verbalizes understanding and is agreeable to discharge.        Arthor Captain, PA-C 05/20/12 2141

## 2012-05-31 NOTE — ED Provider Notes (Signed)
Medical screening examination/treatment/procedure(s) were performed by non-physician practitioner and as supervising physician I was immediately available for consultation/collaboration.  Derwood Kaplan, MD 05/31/12 [phone]

## 2012-06-06 NOTE — ED Provider Notes (Signed)
Medical screening examination/treatment/procedure(s) were performed by non-physician practitioner and as supervising physician I was immediately available for consultation/collaboration.   Lytle Michaels, MD 06/06/12 [phone]

## 2012-09-16 ENCOUNTER — Emergency Department (HOSPITAL_COMMUNITY): Payer: Medicare Other

## 2012-09-16 ENCOUNTER — Inpatient Hospital Stay (HOSPITAL_COMMUNITY)
Admission: EM | Admit: 2012-09-16 | Discharge: 2012-09-19 | DRG: 392 | Disposition: A | Discharge status: Home / Self Care | Payer: Medicare Other | Attending: Internal Medicine | Admitting: Internal Medicine

## 2012-09-16 ENCOUNTER — Encounter (HOSPITAL_COMMUNITY): Payer: Self-pay | Admitting: Emergency Medicine

## 2012-09-16 DIAGNOSIS — F1910 Other psychoactive substance abuse, uncomplicated: Secondary | ICD-10-CM | POA: Diagnosis present

## 2012-09-16 DIAGNOSIS — R0789 Other chest pain: Secondary | ICD-10-CM

## 2012-09-16 DIAGNOSIS — K222 Esophageal obstruction: Secondary | ICD-10-CM

## 2012-09-16 DIAGNOSIS — R079 Chest pain, unspecified: Secondary | ICD-10-CM

## 2012-09-16 DIAGNOSIS — IMO0002 Reserved for concepts with insufficient information to code with codable children: Secondary | ICD-10-CM | POA: Diagnosis present

## 2012-09-16 DIAGNOSIS — K3184 Gastroparesis: Secondary | ICD-10-CM

## 2012-09-16 DIAGNOSIS — F411 Generalized anxiety disorder: Secondary | ICD-10-CM | POA: Diagnosis present

## 2012-09-16 DIAGNOSIS — G4700 Insomnia, unspecified: Secondary | ICD-10-CM | POA: Diagnosis present

## 2012-09-16 DIAGNOSIS — F419 Anxiety disorder, unspecified: Secondary | ICD-10-CM | POA: Diagnosis present

## 2012-09-16 DIAGNOSIS — F3289 Other specified depressive episodes: Secondary | ICD-10-CM | POA: Diagnosis present

## 2012-09-16 DIAGNOSIS — M1710 Unilateral primary osteoarthritis, unspecified knee: Secondary | ICD-10-CM | POA: Diagnosis present

## 2012-09-16 DIAGNOSIS — F17200 Nicotine dependence, unspecified, uncomplicated: Secondary | ICD-10-CM | POA: Diagnosis present

## 2012-09-16 DIAGNOSIS — I10 Essential (primary) hypertension: Secondary | ICD-10-CM | POA: Diagnosis present

## 2012-09-16 DIAGNOSIS — G8929 Other chronic pain: Secondary | ICD-10-CM | POA: Diagnosis present

## 2012-09-16 DIAGNOSIS — F329 Major depressive disorder, single episode, unspecified: Secondary | ICD-10-CM | POA: Diagnosis present

## 2012-09-16 DIAGNOSIS — K219 Gastro-esophageal reflux disease without esophagitis: Principal | ICD-10-CM

## 2012-09-16 DIAGNOSIS — Z765 Malingerer [conscious simulation]: Secondary | ICD-10-CM

## 2012-09-16 DIAGNOSIS — I872 Venous insufficiency (chronic) (peripheral): Secondary | ICD-10-CM | POA: Diagnosis present

## 2012-09-16 DIAGNOSIS — K2270 Barrett's esophagus without dysplasia: Secondary | ICD-10-CM

## 2012-09-16 DIAGNOSIS — I359 Nonrheumatic aortic valve disorder, unspecified: Secondary | ICD-10-CM | POA: Diagnosis present

## 2012-09-16 DIAGNOSIS — K2100 Gastro-esophageal reflux disease with esophagitis, without bleeding: Secondary | ICD-10-CM

## 2012-09-16 DIAGNOSIS — K449 Diaphragmatic hernia without obstruction or gangrene: Secondary | ICD-10-CM | POA: Diagnosis present

## 2012-09-16 LAB — POCT I-STAT TROPONIN I: Troponin i, poc: 0.01 ng/mL (ref 0.00–0.08)

## 2012-09-16 LAB — CBC WITH DIFFERENTIAL/PLATELET
Basophils Absolute: 0 10*3/uL (ref 0.0–0.1)
Basophils Relative: 0 % (ref 0–1)
Eosinophils Absolute: 0 10*3/uL (ref 0.0–0.7)
MCHC: 33.9 g/dL (ref 30.0–36.0)
Monocytes Absolute: 0.4 10*3/uL (ref 0.1–1.0)
Neutro Abs: 2.1 10*3/uL (ref 1.7–7.7)
Neutrophils Relative %: 52 % (ref 43–77)
RDW: 12.6 % (ref 11.5–15.5)

## 2012-09-16 LAB — COMPREHENSIVE METABOLIC PANEL
AST: 16 U/L (ref 0–37)
Albumin: 3.8 g/dL (ref 3.5–5.2)
Chloride: 101 mEq/L (ref 96–112)
Creatinine, Ser: 0.86 mg/dL (ref 0.50–1.10)
Potassium: 4.1 mEq/L (ref 3.5–5.1)
Total Bilirubin: 0.2 mg/dL — ABNORMAL LOW (ref 0.3–1.2)
Total Protein: 7.3 g/dL (ref 6.0–8.3)

## 2012-09-16 LAB — URINALYSIS, ROUTINE W REFLEX MICROSCOPIC
Glucose, UA: NEGATIVE mg/dL
Ketones, ur: NEGATIVE mg/dL
Nitrite: NEGATIVE
Protein, ur: NEGATIVE mg/dL
Urobilinogen, UA: 0.2 mg/dL (ref 0.0–1.0)

## 2012-09-16 LAB — TROPONIN I: Troponin I: <0.3 ng/mL (ref <0.30)

## 2012-09-16 LAB — LACTIC ACID, PLASMA: Lactic Acid, Venous: 0.7 mmol/L (ref 0.5–2.2)

## 2012-09-16 LAB — LIPASE, BLOOD: Lipase: 29 U/L (ref 11–59)

## 2012-09-16 MED ORDER — SODIUM CHLORIDE 0.9 % IV SOLN: 250.0000 mL | INTRAVENOUS | Status: DC | PRN

## 2012-09-16 MED ORDER — ONDANSETRON HCL 4 MG/2ML IJ SOLN
4.0000 mg | Freq: Once | INTRAMUSCULAR | Status: AC
  Administered 2012-09-16: 4 mg via INTRAVENOUS
  Filled 2012-09-16: qty 2

## 2012-09-16 MED ORDER — MORPHINE SULFATE 4 MG/ML IJ SOLN
4.0000 mg | Freq: Once | INTRAMUSCULAR | Status: AC
  Administered 2012-09-16: 4 mg via INTRAVENOUS
  Filled 2012-09-16: qty 1

## 2012-09-16 MED ORDER — SODIUM CHLORIDE 0.9 % IJ SOLN
3.0000 mL | INTRAMUSCULAR | Status: DC | PRN
  Administered 2012-09-16: 3 mL via INTRAVENOUS

## 2012-09-16 MED ORDER — GI COCKTAIL ~~LOC~~
30.0000 mL | Freq: Once | ORAL | Status: AC
  Administered 2012-09-16: 30 mL via ORAL
  Filled 2012-09-16: qty 30

## 2012-09-16 MED ORDER — ONDANSETRON HCL 4 MG/2ML IJ SOLN
4.0000 mg | Freq: Four times a day (QID) | INTRAMUSCULAR | Status: DC | PRN
  Administered 2012-09-16 – 2012-09-17 (×2): 4 mg via INTRAVENOUS
  Filled 2012-09-16 (×2): qty 2

## 2012-09-16 MED ORDER — SUCRALFATE 1 GM/10ML PO SUSP
1.0000 g | Freq: Four times a day (QID) | ORAL | Status: DC
  Administered 2012-09-16 – 2012-09-19 (×10): 1 g via ORAL
  Filled 2012-09-16 (×14): qty 10

## 2012-09-16 MED ORDER — SODIUM CHLORIDE 0.9 % IV SOLN
INTRAVENOUS | Status: DC
  Administered 2012-09-16: 10:00:00 via INTRAVENOUS

## 2012-09-16 MED ORDER — SODIUM CHLORIDE 0.9 % IJ SOLN
3.0000 mL | Freq: Two times a day (BID) | INTRAMUSCULAR | Status: DC
  Administered 2012-09-16 – 2012-09-18 (×5): 3 mL via INTRAVENOUS

## 2012-09-16 MED ORDER — SODIUM CHLORIDE 0.9 % IJ SOLN: 3.0000 mL | Freq: Two times a day (BID) | INTRAMUSCULAR | Status: DC

## 2012-09-16 MED ORDER — ENOXAPARIN SODIUM 40 MG/0.4ML ~~LOC~~ SOLN
40.0000 mg | SUBCUTANEOUS | Status: DC
  Administered 2012-09-16: 40 mg via SUBCUTANEOUS
  Filled 2012-09-16 (×2): qty 0.4

## 2012-09-16 MED ORDER — AMLODIPINE BESYLATE 10 MG PO TABS
10.0000 mg | ORAL_TABLET | Freq: Every day | ORAL | Status: DC
  Administered 2012-09-16 – 2012-09-19 (×3): 10 mg via ORAL
  Filled 2012-09-16 (×4): qty 1

## 2012-09-16 MED ORDER — HYDROCODONE-ACETAMINOPHEN 5-325 MG PO TABS
0.5000 | ORAL_TABLET | ORAL | Status: DC | PRN
  Administered 2012-09-16 – 2012-09-18 (×4): 1 via ORAL
  Filled 2012-09-16 (×4): qty 1

## 2012-09-16 MED ORDER — PANTOPRAZOLE SODIUM 40 MG PO TBEC
40.0000 mg | DELAYED_RELEASE_TABLET | Freq: Every day | ORAL | Status: DC
  Administered 2012-09-16: 40 mg via ORAL

## 2012-09-16 MED ORDER — MORPHINE SULFATE 2 MG/ML IJ SOLN
INTRAMUSCULAR | Status: AC
  Filled 2012-09-16: qty 1

## 2012-09-16 MED ORDER — PANTOPRAZOLE SODIUM 40 MG IV SOLR
40.0000 mg | Freq: Once | INTRAVENOUS | Status: AC
  Administered 2012-09-16: 40 mg via INTRAVENOUS
  Filled 2012-09-16: qty 40

## 2012-09-16 MED ORDER — GI COCKTAIL ~~LOC~~
30.0000 mL | Freq: Four times a day (QID) | ORAL | Status: DC | PRN
  Administered 2012-09-16 – 2012-09-17 (×2): 30 mL via ORAL
  Filled 2012-09-16 (×2): qty 30

## 2012-09-16 MED ORDER — ONDANSETRON HCL 4 MG PO TABS: 4.0000 mg | ORAL_TABLET | Freq: Four times a day (QID) | ORAL | Status: DC | PRN

## 2012-09-16 MED ORDER — MORPHINE SULFATE 2 MG/ML IJ SOLN
2.0000 mg | INTRAMUSCULAR | Status: DC | PRN
  Administered 2012-09-16 – 2012-09-19 (×10): 2 mg via INTRAVENOUS
  Filled 2012-09-16 (×9): qty 1

## 2012-09-16 NOTE — ED Notes (Signed)
Attempted report to floor.  

## 2012-09-16 NOTE — H&P (Signed)
Hospital Admission Note Date: 09/16/2012  Patient name: Sherri Mcmillan Medical record number: [id number] Date of birth: 06/08/1932 Age: 79 y.o. Gender: female PCP: Dierdre Searles, NA, MD  Medical Service: IMTS  Attending physician:  Dr. Dalphine Handing    1st Contact: Dr. Burtis Junes   Pager: [phone] 2nd Contact: Dr. Dierdre Searles    Pager: [phone] After 5 pm or weekends: 1st Contact:      Pager: [phone] 2nd Contact:      Pager: [phone]  Chief Complaint: abdominal pain   History of Present Illness: Sherri Mcmillan 79 yo AA woman pmh of Barrett's esophagus on EGD 9/12, HTN, GERD, and gastroparesis confirmed by GES 9/12 presents with ongoing abdominal pain. Pt states that some acute diarrhea happened 3 days PTA and then spontaneously resolved not associated with any hematochezia or BRBPR or mucus. Pt has had ongoing nausea also since that time along with an associated burning pain that starts in epigastrium and radiates to her mouth leaving a "nasty metallic taste" overnight. It is hard to complete meals without nausea or vomiting despite having a very healthy appetite. She has not noticed any hematemesis or weight loss. No CP or SOB, DOE, PND, LE edema, or new rashes. Epigastric pain in the epigastrium worse when eating. Pt has tried OTC antiacids with little relief and presented to [facility] center for similar frustrations of ongoing heartburn and was just recently started on protonix with little relief. Pt denied any sick contacts, has stopped smoking now for 3 years, no recent travel, or other drug or alcohol use.   Meds: Current Facility-Administered Medications  Medication Dose Route Frequency Provider Last Rate Last Dose  . 0.9 %  sodium chloride infusion   Intravenous Continuous Carleene Cooper III, MD 250 mL/hr at 09/16/12 1024    . gi cocktail (Maalox,Lidocaine,Donnatal)  30 mL Oral Once Dede Query, MD       Current Outpatient Prescriptions  Medication Sig Dispense Refill  . amLODipine (NORVASC) 10 MG tablet Take 1 tablet (10 mg  total) by mouth daily.  30 tablet  5  . furosemide (LASIX) 40 MG tablet Take 60 mg by mouth daily.      Marland Kitchen HYDROcodone-acetaminophen (NORCO) 5-325 MG per tablet Take 0.5-1 tablets by mouth every 4 (four) hours as needed for pain.  10 tablet  0  . pantoprazole (PROTONIX) 40 MG tablet Take 1 tablet (40 mg total) by mouth daily.  60 tablet  PRN  . promethazine (PHENERGAN) 12.5 MG tablet Take 12.5 mg by mouth every 6 (six) hours as needed for nausea.        Allergies: Allergies as of 09/16/2012 - Review Complete 09/16/2012  Allergen Reaction Noted  . Aspirin Rash 07/06/2006   Past Medical History  Diagnosis Date  . Chronic pain     DJD knees, back, migranes, LLE varicose veins, and LLE neuropathy.   . Aortic stenosis     Dx ECHO 2008 , mild and aymptomatic , needs ECHO q 3-5 yrs  . GERD (gastroesophageal reflux disease)     Barrett's esophagus demonstrated on EGD 12/2010 by Dr Arlyce Dice. Needs repeat EGD 12/2011  . Chronic insomnia   . Chronic anxiety     Admission to [facility] (90s or early 2000)  for 6 weeks after mother died. Complicated again by death of her sister 2010. 2012 developed hallucinations and I refused to refill controlled meds unless she see psych which she is not agreable o  . Elevated cholesterol 10/11    LDL 155. Her 10 year  risk (decreasing her age to 55 as the calculator won't go to age 68) is 21% ish. If consider her non smoker (which I wouldn't even though she says 1 pak lasts 2 weeks) risk is 12% ish. So goal for LDL is 130 or 100.  Marland Kitchen HTN (hypertension)     Controlled with 2 drug therapy  . Migraine   . Cataract   . Depression   . Gastroparesis     Demonstrated on GES 12/2010 by Dr Dalene Seltzer  . Bronchitis     hx of   Past Surgical History  Procedure Laterality Date  . Direct laryngoscopy   September 2008     preoperative diagnosis hoarseness with anterior right vocal cord lesion -  direct laryngoscopy and excisional biopsy of right anterior vocal cord lesion done by Dr.  Ezzard Standing  . Meniscectomy   July 2002     preoperative diagnosis torn medial meniscus right knee, partial medial meniscectomy, debridement chondroplasty patellofemoral joint, done by Dr. Madelon Lips  . Abdominal hysterectomy    . Back surgery    . Hemorrhoid surgery    . Appendectomy    . Cataract extraction      left eye  . Polypectomy  2000    Dr.Magod  . Colonoscopy  2000&2005    Dr.Magod  . Esophagogastroduodenoscopy  11/2010  . Knee arthroscopy    . Eye surgery    . Rotator cuff repair  09/21/2011    rt shoulder   Family History  Problem Relation Age of Onset  . Stroke Neg Hx   . Cancer Neg Hx   . Colon cancer Neg Hx   . Anesthesia problems Neg Hx   . Hypotension Neg Hx   . Malignant hyperthermia Neg Hx   . Pseudochol deficiency Neg Hx   . Heart disease Mother   . Hypertension Mother    History   Social History  . Marital Status: Single    Spouse Name: N/A    Number of Children: N/A  . Years of Education: N/A   Occupational History  . Not on file.   Social History Main Topics  . Smoking status: Former Smoker    Types: Cigarettes    Quit date: 06/01/2010  . Smokeless tobacco: Never Used  . Alcohol Use: No  . Drug Use: No  . Sexually Active: No   Other Topics Concern  . 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  Physical Exam: Blood pressure 131/71, pulse 61, temperature 98.9 F (37.2 C), temperature source Oral, resp. rate 20, last menstrual period 04/26/1950, SpO2 100.00%. General: resting in bed, NAD HEENT: PERRL, EOMI, no scleral icterus Cardiac: RRR, no rubs, 2/6 systolic  ejection murmur, no gallops Pulm: clear to auscultation bilaterally, moving normal volumes of air Abd: soft, nontender, nondistended, BS present Ext: warm and well perfused, no pedal edema Neuro: alert and oriented X3, cranial nerves II-XII grossly intact  Lab results: Basic Metabolic Panel:  Recent Labs  16/10/96 0952  NA 137  K 4.1  CL 101  CO2 25  GLUCOSE 112*  BUN 14  CREATININE 0.86  CALCIUM 9.7   Liver Function Tests:  Recent Labs  09/16/12 0952  AST 16  ALT 11  ALKPHOS 78  BILITOT 0.2*  PROT 7.3  ALBUMIN 3.8    Recent Labs  09/16/12 0952  LIPASE 29   CBC:  Recent Labs  09/16/12 0952  WBC 4.0  NEUTROABS 2.1  HGB 11.6*  HCT 34.2*  MCV 86.6  PLT 179    Urinalysis:  Recent Labs  09/16/12 1030  COLORURINE YELLOW  LABSPEC 1.016  PHURINE 5.5  GLUCOSEU NEGATIVE  HGBUR NEGATIVE  BILIRUBINUR NEGATIVE  KETONESUR NEGATIVE  PROTEINUR NEGATIVE  UROBILINOGEN 0.2  NITRITE NEGATIVE  LEUKOCYTESUR MODERATE*   Imaging results:  Dg Abd Acute W/chest  09/16/2012   *RADIOLOGY REPORT*  Clinical Data: Epigastric abdominal pain radiating through the left side of the chest and into the left mid abdomen.  Intermittent nausea and vomiting and diarrhea over the past 5 days.  History of Barrett's esophagus diagnosed in September, 2012.  ACUTE ABDOMEN SERIES (ABDOMEN 2 VIEW & CHEST 1 VIEW)  Comparison: CT chest and abdomen 05/14/2010.  Two-view chest x-ray 01/28/2011.  Findings: Bowel gas pattern unremarkable without evidence of obstruction or significant ileus.  No evidence of free air or significant air fluid levels on the erect image.  Moderate stool burden.  Sigmoid colon extends upward into the left upper quadrant, containing gas, of normal caliber.  Numerous pelvic phleboliths. No visible opaque urinary tract calculi.  Slight upper lumbar scoliosis convex left and degenerative changes involving the lower lumbar spine.  Cardiac silhouette normal in size, unchanged.   Thoracic aorta mildly tortuous, unchanged.  Hilar and mediastinal contours otherwise unremarkable.  Lungs clear.  Bronchovascular markings normal.  Pulmonary vascularity normal.  No pneumothorax.  No pleural effusions.  IMPRESSION: No acute abdominal or pulmonary abnormality.   Original Report Authenticated By: Hulan Saas, M.D.   Assessment & Plan by Problem: 1. Chest pain likely GERD: Pts hx most fits GI etiology but ACS should be ruled out. VSS and no apparent signs of esophageal rupture or dissection. Labs don't indicate acute pancreatitis or biliary obstruction. Pt has known barrett's esophagus lost to follow up and has been without prescription medication for GERD until 3 days ago with little relief. Pt also known gastroparesis not currently treated.  Initial trop was negative, ABx showed no acute abnormality but some stool burden.  -EKG -Bmet -GI cocktail q6prn -protonix 40mg  BID -zofran prn -will need EGD as outpt -troponin x3  Dispo: Disposition is deferred at this time, awaiting improvement of current medical problems. Anticipated discharge in approximately 1-2 day(s).   The patient does have a current PCP (LI, NA, MD), therefore will be requiring [facility]PC follow-up after discharge.   The patient does not have transportation limitations that hinder transportation to clinic appointments.  SignedChristen Bame 09/16/2012, 2:34 PM  Pgr: [phone]

## 2012-09-16 NOTE — ED Notes (Signed)
To xray via strecher.

## 2012-09-16 NOTE — ED Notes (Signed)
Patient presents to the ED with complaints of abdominal pain for 3 weeks. Intermittent nausea and vomiting and diarrhea for the 3 weeks. Poor appetite for the same time period.

## 2012-09-16 NOTE — ED Notes (Signed)
Pt's niece works on [address], her name is Darlene Davis--if needed.

## 2012-09-16 NOTE — Progress Notes (Signed)
Interval progress note:   S: Went to evaluate the patient after nursing informed that she had only minimal relief of her chest/abdominal pain following ingestion of a GI cocktail. Morphine was ordered PRN. At the time of evaluation, patient had already been given morphine and was feeling much better.   O:  General: lying in bed in NAD  HEENT: PERRL, no scleral icterus  Cardiac: RRR Pulm: normal WOB, clear to auscultation bilaterally Abdomen: minimal diffuse tenderness without rebound, soft, non-distended Ext: warm and well perfused, no pedal edema  Neuro: alert and oriented X3, cranial nerves II-XII grossly intact  A/P: Abdominal complaints (sour taste in mouth, heartburn) are most consistent with recently worsened GERD with associated esophagitis. Given the patient has been stable since the pain began 3 days ago, esophageal rupture is unlikely. Will continue to monitor vitals/symptoms overnight and amend treatment plan as indicated. - carafate q6h - cont protonix - zofran PRN - morphine PRN - GI cocktail PRN

## 2012-09-16 NOTE — ED Provider Notes (Signed)
History     CSN: [id number]  Arrival date & time 09/16/12  2841   First MD Initiated Contact with Patient 09/16/12 [phone]      Chief Complaint  Patient presents with  . Abdominal Pain    (Consider location/radiation/quality/duration/timing/severity/associated sxs/prior treatment) Patient is a 79 y.o. female presenting with abdominal pain. The history is provided by the patient. No language interpreter was used.  Abdominal Pain This is a new problem. The current episode started more than 1 week ago (Patient is a 79 year old woman who has developed epigastric and substernal pain over a three-week period). The problem occurs constantly. The problem has been gradually worsening (She was seen at an urgent care Center and was prescribed proton next, and Phenergan, and hydrocodone acetaminophen.). Associated symptoms include chest pain and abdominal pain. Nothing aggravates the symptoms. Nothing relieves the symptoms. Treatments tried: Pt was given PPI and narcotic pain medicine yesterday without relief.    Past Medical History  Diagnosis Date  . Chronic pain     DJD knees, back, migranes, LLE varicose veins, and LLE neuropathy.   . Aortic stenosis     Dx ECHO 2008 , mild and aymptomatic , needs ECHO q 3-5 yrs  . GERD (gastroesophageal reflux disease)     Barrett's esophagus demonstrated on EGD 12/2010 by Dr Arlyce Dice. Needs repeat EGD 12/2011  . Chronic insomnia   . Chronic anxiety     Admission to [facility] (90s or early 2000)  for 6 weeks after mother died. Complicated again by death of her sister 2010. 2012 developed hallucinations and I refused to refill controlled meds unless she see psych which she is not agreable o  . Elevated cholesterol 10/11    LDL 155. Her 10 year risk (decreasing her age to 64 as the calculator won't go to age 54) is 21% ish. If consider her non smoker (which I wouldn't even though she says 1 pak lasts 2 weeks) risk is 12% ish. So goal for LDL is 130 or 100.  Marland Kitchen HTN  (hypertension)     Controlled with 2 drug therapy  . Migraine   . Cataract   . Depression   . Gastroparesis     Demonstrated on GES 12/2010 by Dr Dalene Seltzer  . Bronchitis     hx of    Past Surgical History  Procedure Laterality Date  . Direct laryngoscopy   September 2008     preoperative diagnosis hoarseness with anterior right vocal cord lesion -  direct laryngoscopy and excisional biopsy of right anterior vocal cord lesion done by Dr. Ezzard Standing  . Meniscectomy   July 2002     preoperative diagnosis torn medial meniscus right knee, partial medial meniscectomy, debridement chondroplasty patellofemoral joint, done by Dr. Madelon Lips  . Abdominal hysterectomy    . Back surgery    . Hemorrhoid surgery    . Appendectomy    . Cataract extraction      left eye  . Polypectomy  2000    Dr.Magod  . Colonoscopy  2000&2005    Dr.Magod  . Esophagogastroduodenoscopy  11/2010  . Knee arthroscopy    . Eye surgery    . Rotator cuff repair  09/21/2011    rt shoulder    Family History  Problem Relation Age of Onset  . Stroke Neg Hx   . Cancer Neg Hx   . Colon cancer Neg Hx   . Anesthesia problems Neg Hx   . Hypotension Neg Hx   . Malignant hyperthermia Neg  Hx   . Pseudochol deficiency Neg Hx   . Heart disease Mother   . Hypertension Mother     History  Substance Use Topics  . Smoking status: Former Smoker    Types: Cigarettes    Quit date: 06/01/2010  . Smokeless tobacco: Never Used  . Alcohol Use: No    OB History   Grav Para Term Preterm Abortions TAB SAB Ect Mult Living                  Review of Systems  Constitutional: Negative.  Negative for fever and chills.  HENT: Negative.   Eyes: Negative.   Respiratory: Negative.   Cardiovascular: Positive for chest pain.  Gastrointestinal: Positive for abdominal pain. Negative for nausea, vomiting and diarrhea.  Genitourinary: Negative.   Musculoskeletal: Negative.   Skin: Positive for color change.       Hyperpigmentation on  left lower leg due to chronic venous insufficiency.  Neurological: Negative.   Psychiatric/Behavioral: Negative.     Allergies  Aspirin  Home Medications   Current Outpatient Rx  Name  Route  Sig  Dispense  Refill  . amLODipine (NORVASC) 10 MG tablet   Oral   Take 1 tablet (10 mg total) by mouth daily.   30 tablet   5   . furosemide (LASIX) 40 MG tablet   Oral   Take 60 mg by mouth daily.         Marland Kitchen HYDROcodone-acetaminophen (NORCO) 5-325 MG per tablet   Oral   Take 0.5-1 tablets by mouth every 4 (four) hours as needed for pain.   10 tablet   0   . pantoprazole (PROTONIX) 40 MG tablet   Oral   Take 1 tablet (40 mg total) by mouth daily.   60 tablet   PRN   . promethazine (PHENERGAN) 12.5 MG tablet   Oral   Take 12.5 mg by mouth every 6 (six) hours as needed for nausea.           BP 176/63  Pulse 72  Temp(Src) 98.9 F (37.2 C) (Oral)  Resp 20  SpO2 95%  LMP 04/26/1950  Physical Exam  Nursing note and vitals reviewed. Constitutional: She is oriented to person, place, and time.  Slender elderly lady in mild to moderate distress complaining of a burning epigastric and substernal pain.  HENT:  Head: Normocephalic and atraumatic.  Right Ear: External ear normal.  Left Ear: External ear normal.  Mouth/Throat: Oropharynx is clear and moist.  Eyes: Conjunctivae and EOM are normal. Pupils are equal, round, and reactive to light. No scleral icterus.  Neck: Normal range of motion. Neck supple.  Cardiovascular: Normal rate, regular rhythm and normal heart sounds.   Pulmonary/Chest: Effort normal and breath sounds normal.  Abdominal: Soft. Bowel sounds are normal.  Musculoskeletal: Normal range of motion.  She has hyperpigmentation on the in her aspect of the left lower leg just above her left ankle. Skin there is mildly indurated, feeling like a chronic edema.  Neurological: She is alert and oriented to person, place, and time.  No sensory or motor deficit.   Skin: Skin is warm and dry.  Psychiatric: She has a normal mood and affect. Her behavior is normal.    ED Course  Procedures (including critical care time)  Labs Reviewed  CBC WITH DIFFERENTIAL - Abnormal; Notable for the following:    Hemoglobin 11.6 (*)    HCT 34.2 (*)    All other components within normal limits  COMPREHENSIVE METABOLIC PANEL  LIPASE, BLOOD  URINALYSIS, ROUTINE W REFLEX MICROSCOPIC  POCT I-STAT TROPONIN I   10:37 AM Patient was seen and had physical examination. Laboratory tests were ordered. IV Protonix and IV Zofran were ordered.  12:13 PM Results for orders placed during the hospital encounter of 09/16/12  CBC WITH DIFFERENTIAL      Result Value Range   WBC 4.0  4.0 - 10.5 K/uL   RBC 3.95  3.87 - 5.11 MIL/uL   Hemoglobin 11.6 (*) 12.0 - 15.0 g/dL   HCT 45.4 (*) 09.8 - 11.9 %   MCV 86.6  78.0 - 100.0 fL   MCH 29.4  26.0 - 34.0 pg   MCHC 33.9  30.0 - 36.0 g/dL   RDW 14.7  82.9 - 56.2 %   Platelets 179  150 - 400 K/uL   Neutrophils Relative % 52  43 - 77 %   Neutro Abs 2.1  1.7 - 7.7 K/uL   Lymphocytes Relative 38  12 - 46 %   Lymphs Abs 1.5  0.7 - 4.0 K/uL   Monocytes Relative 9  3 - 12 %   Monocytes Absolute 0.4  0.1 - 1.0 K/uL   Eosinophils Relative 1  0 - 5 %   Eosinophils Absolute 0.0  0.0 - 0.7 K/uL   Basophils Relative 0  0 - 1 %   Basophils Absolute 0.0  0.0 - 0.1 K/uL  COMPREHENSIVE METABOLIC PANEL      Result Value Range   Sodium 137  135 - 145 mEq/L   Potassium 4.1  3.5 - 5.1 mEq/L   Chloride 101  96 - 112 mEq/L   CO2 25  19 - 32 mEq/L   Glucose, Bld 112 (*) 70 - 99 mg/dL   BUN 14  6 - 23 mg/dL   Creatinine, Ser 1.30  0.50 - 1.10 mg/dL   Calcium 9.7  8.4 - 86.5 mg/dL   Total Protein 7.3  6.0 - 8.3 g/dL   Albumin 3.8  3.5 - 5.2 g/dL   AST 16  0 - 37 U/L   ALT 11  0 - 35 U/L   Alkaline Phosphatase 78  39 - 117 U/L   Total Bilirubin 0.2 (*) 0.3 - 1.2 mg/dL   GFR calc non Af Amer 64 (*) >90 mL/min   GFR calc Af Amer 74 (*) >90  mL/min  LIPASE, BLOOD      Result Value Range   Lipase 29  11 - 59 U/L  URINALYSIS, ROUTINE W REFLEX MICROSCOPIC      Result Value Range   Color, Urine YELLOW  YELLOW   APPearance CLEAR  CLEAR   Specific Gravity, Urine 1.016  1.005 - 1.030   pH 5.5  5.0 - 8.0   Glucose, UA NEGATIVE  NEGATIVE mg/dL   Hgb urine dipstick NEGATIVE  NEGATIVE   Bilirubin Urine NEGATIVE  NEGATIVE   Ketones, ur NEGATIVE  NEGATIVE mg/dL   Protein, ur NEGATIVE  NEGATIVE mg/dL   Urobilinogen, UA 0.2  0.0 - 1.0 mg/dL   Nitrite NEGATIVE  NEGATIVE   Leukocytes, UA MODERATE (*) NEGATIVE  URINE MICROSCOPIC-ADD ON      Result Value Range   Squamous Epithelial / LPF FEW (*) RARE   WBC, UA 3-6  <3 WBC/hpf   Bacteria, UA RARE  RARE   Casts HYALINE CASTS (*) NEGATIVE  POCT I-STAT TROPONIN I      Result Value Range   Troponin i, poc 0.01  0.00 - 0.08 ng/mL   Comment 3            Dg Abd Acute W/chest  09/16/2012   *RADIOLOGY REPORT*  Clinical Data: Epigastric abdominal pain radiating through the left side of the chest and into the left mid abdomen.  Intermittent nausea and vomiting and diarrhea over the past 5 days.  History of Barrett's esophagus diagnosed in September, 2012.  ACUTE ABDOMEN SERIES (ABDOMEN 2 VIEW & CHEST 1 VIEW)  Comparison: CT chest and abdomen 05/14/2010.  Two-view chest x-ray 01/28/2011.  Findings: Bowel gas pattern unremarkable without evidence of obstruction or significant ileus.  No evidence of free air or significant air fluid levels on the erect image.  Moderate stool burden.  Sigmoid colon extends upward into the left upper quadrant, containing gas, of normal caliber.  Numerous pelvic phleboliths. No visible opaque urinary tract calculi.  Slight upper lumbar scoliosis convex left and degenerative changes involving the lower lumbar spine.  Cardiac silhouette normal in size, unchanged.  Thoracic aorta mildly tortuous, unchanged.  Hilar and mediastinal contours otherwise unremarkable.  Lungs clear.   Bronchovascular markings normal.  Pulmonary vascularity normal.  No pneumothorax.  No pleural effusions.  IMPRESSION: No acute abdominal or pulmonary abnormality.   Original Report Authenticated By: Hulan Saas, M.D.    Lab workup is reassuringly negative, but pt continues to complain bitterly of burning pain in the chest.  Will ask [facility] Teaching [facility] B to see her.  4:05 PM  Date: 09/16/2012  Rate:63  Rhythm: normal sinus rhythm  QRS Axis: normal  Intervals: normal PQRS:  Left atrial abnormality  ST/T Wave abnormalities: normal  Conduction Disutrbances:none  Narrative Interpretation: Essentially normal EKG  Old EKG Reviewed: none available     1. Chest pain   2. Esophageal reflux          Carleene Cooper III, MD 09/16/12 [phone]

## 2012-09-16 NOTE — ED Notes (Signed)
Patient back from x-ray tearful family advised that patient is very depressed. Family also advised that she has been treated by behavioral health for depression.

## 2012-09-17 ENCOUNTER — Observation Stay (HOSPITAL_COMMUNITY): Payer: Medicare Other

## 2012-09-17 ENCOUNTER — Encounter (HOSPITAL_COMMUNITY)
Admission: EM | Disposition: A | Discharge status: Home / Self Care | Payer: Self-pay | Source: Home / Self Care | Attending: Internal Medicine

## 2012-09-17 ENCOUNTER — Encounter (HOSPITAL_COMMUNITY): Payer: Self-pay

## 2012-09-17 DIAGNOSIS — K219 Gastro-esophageal reflux disease without esophagitis: Principal | ICD-10-CM

## 2012-09-17 DIAGNOSIS — K222 Esophageal obstruction: Secondary | ICD-10-CM

## 2012-09-17 DIAGNOSIS — R079 Chest pain, unspecified: Secondary | ICD-10-CM

## 2012-09-17 DIAGNOSIS — K2100 Gastro-esophageal reflux disease with esophagitis, without bleeding: Secondary | ICD-10-CM

## 2012-09-17 DIAGNOSIS — K2270 Barrett's esophagus without dysplasia: Secondary | ICD-10-CM

## 2012-09-17 HISTORY — PX: ESOPHAGOGASTRODUODENOSCOPY: SHX5428

## 2012-09-17 LAB — HEPATIC FUNCTION PANEL
AST: 21 U/L (ref 0–37)
Albumin: 3.7 g/dL (ref 3.5–5.2)
Bilirubin, Direct: <0.1 mg/dL (ref 0.0–0.3)
Total Bilirubin: 0.3 mg/dL (ref 0.3–1.2)

## 2012-09-17 LAB — BASIC METABOLIC PANEL
Chloride: 103 mEq/L (ref 96–112)
GFR calc Af Amer: 75 mL/min — ABNORMAL LOW (ref >90)
GFR calc non Af Amer: 64 mL/min — ABNORMAL LOW (ref >90)
Potassium: 3.8 mEq/L (ref 3.5–5.1)

## 2012-09-17 LAB — CBC
HCT: 35.2 % — ABNORMAL LOW (ref 36.0–46.0)
Hemoglobin: 11.8 g/dL — ABNORMAL LOW (ref 12.0–15.0)
MCH: 29.1 pg (ref 26.0–34.0)
MCHC: 33.5 g/dL (ref 30.0–36.0)
MCV: 86.9 fL (ref 78.0–100.0)
Platelets: 190 10*3/uL (ref 150–400)
RBC: 4.05 MIL/uL (ref 3.87–5.11)
RDW: 12.8 % (ref 11.5–15.5)
WBC: 3.4 10*3/uL — ABNORMAL LOW (ref 4.0–10.5)

## 2012-09-17 LAB — TROPONIN I: Troponin I: <0.3 ng/mL (ref <0.30)

## 2012-09-17 SURGERY — EGD (ESOPHAGOGASTRODUODENOSCOPY)
Anesthesia: Moderate Sedation

## 2012-09-17 MED ORDER — GI COCKTAIL ~~LOC~~
30.0000 mL | Freq: Four times a day (QID) | ORAL | Status: DC
  Administered 2012-09-17 – 2012-09-19 (×8): 30 mL via ORAL
  Filled 2012-09-17 (×12): qty 30

## 2012-09-17 MED ORDER — MIDAZOLAM HCL 5 MG/ML IJ SOLN
INTRAMUSCULAR | Status: AC
  Filled 2012-09-17: qty 2

## 2012-09-17 MED ORDER — FENTANYL CITRATE 0.05 MG/ML IJ SOLN
INTRAMUSCULAR | Status: DC | PRN
  Administered 2012-09-17: 25 ug via INTRAVENOUS

## 2012-09-17 MED ORDER — SUCRALFATE 1 GM/10ML PO SUSP: 1.0000 g | Freq: Four times a day (QID) | ORAL | Status: DC

## 2012-09-17 MED ORDER — MIDAZOLAM HCL 10 MG/2ML IJ SOLN
INTRAMUSCULAR | Status: DC | PRN
  Administered 2012-09-17: 1 mg via INTRAVENOUS
  Administered 2012-09-17: 2 mg via INTRAVENOUS
  Administered 2012-09-17: 1 mg via INTRAVENOUS

## 2012-09-17 MED ORDER — BUTAMBEN-TETRACAINE-BENZOCAINE 2-2-14 % EX AERO
INHALATION_SPRAY | CUTANEOUS | Status: DC | PRN
  Administered 2012-09-17: 2 via TOPICAL

## 2012-09-17 MED ORDER — FENTANYL CITRATE 0.05 MG/ML IJ SOLN
INTRAMUSCULAR | Status: AC
  Filled 2012-09-17: qty 4

## 2012-09-17 MED ORDER — PANTOPRAZOLE SODIUM 40 MG IV SOLR
40.0000 mg | Freq: Two times a day (BID) | INTRAVENOUS | Status: DC
  Administered 2012-09-17 – 2012-09-19 (×5): 40 mg via INTRAVENOUS
  Filled 2012-09-17 (×7): qty 40

## 2012-09-17 MED ORDER — SODIUM CHLORIDE 0.9 % IV SOLN: 250.0000 mL | INTRAVENOUS | Status: DC | PRN

## 2012-09-17 MED ORDER — SODIUM CHLORIDE 0.9 % IV SOLN: INTRAVENOUS | Status: DC

## 2012-09-17 NOTE — H&P (Signed)
Internal Medicine Teaching Service Attending Note Date: 09/17/2012  Patient name: Sherri Mcmillan  Medical record number: [id number]  Date of birth: 03/10/1933   Brief History and current situation: The patient, Sherri Mcmillan, is a 79 y.o. year old female, with past medical history of  Barrett's esophagus with GERD (EGD 9/12) and gastroparesis (GES 9/12), who comes in with the chief  complaint of severe chest/abdominal pain of burning nature, and a metallic taste in mouth. She denies any hematemesis or weight loss. She has had a few episodes of vomiting in the past 1-2 days and has little relief with antacids or oral protonix which she just started.   I have read the history documented by Dr.McTyre and I concur with the chronology of events.   When I met with the patient today, she was still having burning pain and was worried if her heart was okay. She had not had any episode of vomiting since admission.   Past medical history, social history and medications have been reviewed.   Review of systems as per HPI and resident note.   Vitals:  Filed Vitals:   09/16/12 1615 09/16/12 1724 09/16/12 2132 09/17/12 0619  BP: 135/84 156/76 142/79 137/76  Pulse:  57 57 62  Temp: 98.3 F (36.8 C) 99.1 F (37.3 C) 98.5 F (36.9 C) 98.4 F (36.9 C)  TempSrc: Oral Oral Oral Oral  Resp: 20 18 18 18   Height:  5' (1.524 m)    Weight:  129 lb 13.6 oz (58.9 kg)  130 lb 4.7 oz (59.1 kg)  SpO2: 99% 100% 100% 95%    Exam:  I met with patient around 9am today  General: Resting in bed. HEENT: PERRL, EOMI, no scleral icterus. Heart: RRR, no rubs, +systolic murmur. Lungs: Clear to auscultation bilaterally, no wheezes, rales, or rhonchi. Abdomen: Soft, mild tenderness diffusely but more in the upper abdomen, some guarding and two longitudinal scars, positive bowel sounds.  Extremities: Warm, no pedal edema. Neuro: Alert and oriented X3, cranial nerves II-XII grossly intact,  strength and sensation to  light touch equal in bilateral upper and lower extremities  Labs:  CMP   Recent Labs Lab 09/16/12 0952 09/17/12 0535  NA 137 137  K 4.1 3.8  CL 101 103  CO2 25 25  GLUCOSE 112* 91  BUN 14 10  CREATININE 0.86 0.85  CALCIUM 9.7 9.3    Recent Labs Lab 09/16/12 0952 09/17/12 0535  AST 16 21  ALT 11 10  ALKPHOS 78 68  BILITOT 0.2* 0.3  PROT 7.3 7.2  ALBUMIN 3.8 3.7    CBC  Recent Labs Lab 09/16/12 0952 09/17/12 0535  HGB 11.6* 11.8*  HCT 34.2* 35.2*  WBC 4.0 3.4*  PLT 179 190    Recent Labs Lab 09/16/12 1823 09/16/12 2335  TROPONINI <0.30 <0.30    Urinalysis    Component Value Date/Time   COLORURINE YELLOW 09/16/2012 1030   APPEARANCEUR CLEAR 09/16/2012 1030   LABSPEC 1.016 09/16/2012 1030   PHURINE 5.5 09/16/2012 1030   GLUCOSEU NEGATIVE 09/16/2012 1030   HGBUR NEGATIVE 09/16/2012 1030   BILIRUBINUR NEGATIVE 09/16/2012 1030   KETONESUR NEGATIVE 09/16/2012 1030   PROTEINUR NEGATIVE 09/16/2012 1030   UROBILINOGEN 0.2 09/16/2012 1030   NITRITE NEGATIVE 09/16/2012 1030   LEUKOCYTESUR MODERATE* 09/16/2012 1030    Imaging: Dg Abd Acute W/chest  09/16/2012   *RADIOLOGY REPORT*  Clinical Data: Epigastric abdominal pain radiating through the left side of the chest and into the left mid abdomen.  Intermittent nausea and vomiting and diarrhea over the past 5 days.  History of Barrett's esophagus diagnosed in September, 2012.  ACUTE ABDOMEN SERIES (ABDOMEN 2 VIEW & CHEST 1 VIEW)  Comparison: CT chest and abdomen 05/14/2010.  Two-view chest x-ray 01/28/2011.  Findings: Bowel gas pattern unremarkable without evidence of obstruction or significant ileus.  No evidence of free air or significant air fluid levels on the erect image.  Moderate stool burden.  Sigmoid colon extends upward into the left upper quadrant, containing gas, of normal caliber.  Numerous pelvic phleboliths. No visible opaque urinary tract calculi.  Slight upper lumbar scoliosis convex left and degenerative  changes involving the lower lumbar spine.  Cardiac silhouette normal in size, unchanged.  Thoracic aorta mildly tortuous, unchanged.  Hilar and mediastinal contours otherwise unremarkable.  Lungs clear.  Bronchovascular markings normal.  Pulmonary vascularity normal.  No pneumothorax.  No pleural effusions.  IMPRESSION: No acute abdominal or pulmonary abnormality.   Original Report Authenticated By: Hulan Saas, M.D.     EKG - It does not show any changes significant for Acute MI at this time.     ASSESSMENT AND PLAN   I agree with the plan of the resident team with the following additions/thoughts:  Barrett's esophagus with abdominal pain and heart burn - we will perform FOBT. GI consult already called. Patient on IV protonix BID with GI cocktail. Might need EGD, given the severity of pain, it could be done as inpatient. Would defer to GI to advise [place] on that.     Other chronic issues per resident note.    Thanks, [name], MD 5/25/201410:46 AM

## 2012-09-17 NOTE — Consult Note (Signed)
[facility] Consult: 9:31 AM 09/17/2012   Referring Provider: Dr Henrine Screws of IM teaching service Primary Care Physician:  Dede Query, MD Primary Gastroenterologist:  Dr. Arlyce Dice   Reason for Consultation:  Nausea and vomiting.  HPI: Sherri Mcmillan is a 79 y.o. female.  79 year old AAF.  Barretts esophagus confirmed by 2012 EGD and BX.  Adenomatous colon polyps in 2005, none on Colonoscopy in 2012.   Gastroparesis by nuc med GES in 2012.  Variably compliant with PPI and Zantac, gets a lot of epigastric burning with meals which she relieves with Ibuprofen up to 1200 mg daily.  Also takes hydrocodone prn for headache.  Vomits intermittently, does not describe dysphagia. Vomiting accelerated in last 7 to 10 days.  On Monday given Rx for Protonix and Promethazine.  Sxs did not really improve.  Emesis is white in color.  There is mid and epigastric abdominal pain.   Last emesis was on Friday she says.  No sweats or chills.  No diarrhea.  Tends to constipation, BMs every 2 to 3 days.  No BPR.     Past Medical History  Diagnosis Date  . Chronic pain     DJD knees, back, migranes, LLE varicose veins, and LLE neuropathy.   . Aortic stenosis     Dx ECHO 2008 , mild and aymptomatic , needs ECHO q 3-5 yrs  . GERD (gastroesophageal reflux disease)     Barrett's esophagus demonstrated on EGD 12/2010 by Dr Arlyce Dice. Needs repeat EGD 12/2011  . Chronic insomnia   . Chronic anxiety     Admission to [facility] (90s or early 2000)  for 6 weeks after mother died. Complicated again by death of her sister 2010. 2012 developed hallucinations and I refused to refill controlled meds unless she see psych which she is not agreable o  . Elevated cholesterol 10/11    LDL 155. Her 10 year risk (decreasing her age to 51 as the calculator won't go to age 35) is 21% ish. If consider her non smoker (which I wouldn't even though she says 1 pak lasts 2 weeks) risk is 12% ish. So goal for LDL is  130 or 100.  Marland Kitchen HTN (hypertension)     Controlled with 2 drug therapy  . Migraine   . Cataract   . Depression   . Gastroparesis     Demonstrated on GES 12/2010 by Dr Dalene Seltzer  . Bronchitis     hx of    Past Surgical History  Procedure Laterality Date  . Direct laryngoscopy   September 2008     preoperative diagnosis hoarseness with anterior right vocal cord lesion -  direct laryngoscopy and excisional biopsy of right anterior vocal cord lesion done by Dr. Ezzard Standing  . Meniscectomy   July 2002     preoperative diagnosis torn medial meniscus right knee, partial medial meniscectomy, debridement chondroplasty patellofemoral joint, done by Dr. Madelon Lips  . Abdominal hysterectomy    . Back surgery    . Hemorrhoid surgery    . Appendectomy    . Cataract extraction      left eye  . Polypectomy  2000    Dr.Magod  . Colonoscopy  2000&2005    Dr.Magod  . Esophagogastroduodenoscopy  11/2010  . Knee arthroscopy    . Eye surgery    . Rotator cuff repair  09/21/2011    rt shoulder    Prior to Admission medications   Medication Sig Start Date End Date Taking? Authorizing Provider  amLODipine (NORVASC) 10 MG tablet Take 1 tablet (10 mg total) by mouth daily. 03/27/12  Yes Manuela Schwartz, MD  furosemide (LASIX) 40 MG tablet Take 60 mg by mouth daily.   Yes Historical Provider, MD  HYDROcodone-acetaminophen (NORCO) 5-325 MG per tablet Take 0.5-1 tablets by mouth every 4 (four) hours as needed for pain. 05/16/12  Yes Arthor Captain, PA-C  pantoprazole (PROTONIX) 40 MG tablet Take 1 tablet (40 mg total) by mouth daily. 03/27/12  Yes Manuela Schwartz, MD  promethazine (PHENERGAN) 12.5 MG tablet Take 12.5 mg by mouth every 6 (six) hours as needed for nausea.   Yes Historical Provider, MD    Scheduled Meds: . amLODipine  10 mg Oral Daily  . enoxaparin (LOVENOX) injection  40 mg Subcutaneous Q24H  . gi cocktail  30 mL Oral Q6H  . pantoprazole (PROTONIX) IV  40 mg Intravenous Q12H  . sodium  chloride  3 mL Intravenous Q12H  . sodium chloride  3 mL Intravenous Q12H  . sucralfate  1 g Oral Q6H   Infusions:   PRN Meds: sodium chloride, HYDROcodone-acetaminophen, morphine injection, ondansetron (ZOFRAN) IV, ondansetron, sodium chloride   Allergies as of 09/16/2012 - Review Complete 09/16/2012  Allergen Reaction Noted  . Milk-related compounds  09/16/2012  . Aspirin Rash 07/06/2006    Family History  Problem Relation Age of Onset  . Stroke Neg Hx   . Cancer Neg Hx   . Colon cancer Neg Hx   . Anesthesia problems Neg Hx   . Hypotension Neg Hx   . Malignant hyperthermia Neg Hx   . Pseudochol deficiency Neg Hx   . Heart disease Mother   . Hypertension Mother     History   Social History  . Marital Status: Single    Spouse Name: N/A    Number of Children: N/A  . Years of Education: N/A   Occupational History  . Not on file.   Social History Main Topics  . Smoking status: Former Smoker    Types: Cigarettes    Quit date: 06/01/2010  . Smokeless tobacco: Never Used  . Alcohol Use: No  . Drug Use: No  . Sexually Active: No   Other Topics Concern  . 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  Voice is hoarse, this is not a chronic problem Pulm:  No SOB.  + clear sputum with cough CV:  No palps or chest pain.  swellling in left LE is and old issue GU:  No hematuria GI:  Per HPI Heme:  No hx of Iron supplements or anemia    Transfusions:  None  Neuro:  No numbness or tingling.  Chronic Migraine headaches.  No blurry vision Derm:  No rash, no itching,  no sores Endocrine:  No sweats, no polydipsia Immunization:  Up to date Travel:  none   PHYSICAL EXAM: Vital signs in last 24 hours: Temp:  [98.3 F (36.8 C)-99.1 F (37.3 C)] 98.4 F (36.9 C) (05/25 1610) Pulse  Rate:  [55-66] 62 (05/25 0619) Resp:  [18-20] 18 (05/25 0619) BP: (131-170)/(71-96) 137/76 mmHg (05/25 0619) SpO2:  [93 %-100 %] 95 % (05/25 0619) Weight:  [58.9 kg (129 lb 13.6 oz)-59.1 kg (130 lb 4.7 oz)] 59.1 kg (130 lb 4.7 oz) (05/25 0619)  General: pleasant, slightly anxious AAF.  Looks well Head:  No asymmetry  Eyes:  No icterus or pallor Ears:  Slightly HOH  Nose:  No discharge, sounds conjested Mouth:  Upper dentures, pink and clear oral MM Neck:  No JVD or bruits Lungs:  Clear bil.  No cough Heart: RRR.  No MRG Abdomen:  Soft, hypoactive BS.  Mild tenderness in mid abomen and epigastrum.  Scarring c/w hysterectomy and appendectomy.  Rectal: did not perform   Musc/Skeltl: no erythema or swelling Extremities:  slightl edema left foot  Neurologic:  Pleasant, oriented x 3.  No gross deficits, no tremor Skin:  No rash, no telangectasia Tattoos:  none Nodes:  No inguinal adenopathy   Psych:  Pleasant, slightly anxious.  Fully awake and alert.   Intake/Output from previous day:   Intake/Output this shift: Total I/O In: 360 [P.O.:360] Out: -   LAB RESULTS:  Recent Labs  09/16/12 0952 09/17/12 0535  WBC 4.0 3.4*  HGB 11.6* 11.8*  HCT 34.2* 35.2*  PLT 179 190  MCV 86  BMET Lab Results  Component Value Date   NA 137 09/17/2012   NA 137 09/16/2012   NA 140 11/03/2011   K 3.8 09/17/2012   K 4.1 09/16/2012   K 4.1 11/03/2011   CL 103 09/17/2012   CL 101 09/16/2012   CL 105 11/03/2011   CO2 25 09/17/2012   CO2 25 09/16/2012   CO2 25 11/03/2011   GLUCOSE 91 09/17/2012   GLUCOSE 112* 09/16/2012   GLUCOSE 110* 11/03/2011   BUN 10 09/17/2012   BUN 14 09/16/2012   BUN 19 11/03/2011   CREATININE 0.85 09/17/2012   CREATININE 0.86 09/16/2012   CREATININE 0.90  11/03/2011   CALCIUM 9.3 09/17/2012   CALCIUM 9.7 09/16/2012   CALCIUM 9.9 11/03/2011   LFT  Recent Labs  09/16/12 0952  PROT 7.3  ALBUMIN 3.8  AST 16  ALT 11  ALKPHOS 78  BILITOT 0.2*   PT/INR Lab Results  Component Value Date   INR 1.1 12/16/2006   INR 1.0 12/10/2006    RADIOLOGY STUDIES: Dg Abd Acute W/chest 09/16/2012     Findings: Bowel gas pattern unremarkable without evidence of obstruction or significant ileus.  No evidence of free air or significant air fluid levels on the erect image.  Moderate stool burden.  Sigmoid colon extends upward into the left upper quadrant, containing gas, of normal caliber.  Numerous pelvic phleboliths. No visible opaque urinary tract calculi.  Slight upper lumbar scoliosis convex left and degenerative changes involving the lower lumbar spine.  Cardiac silhouette normal in size, unchanged.  Thoracic aorta mildly tortuous, unchanged.  Hilar and mediastinal contours otherwise unremarkable.  Lungs clear.  Bronchovascular markings normal.  Pulmonary vascularity normal.  No pneumothorax.  No pleural effusions.  IMPRESSION: No acute abdominal or pulmonary abnormality.   Original Report Authenticated By: Hulan Saas, M.D.    ENDOSCOPIC STUDIES: 08/2010   Colonoscopy for his adenomatous polyp in 2005 Normal study, no new polyps.  10 year follow up   10/2010  EGD for dyspepsia Irregular Z line.  Pathology showed Barretts.  No H Pylori.   11/2010 GES Delayed gastric emptying at 2 hours 49%  retention of gastric contents.   IMPRESSION: *  Nausea, vomiting.  Rule out gastroparesis, rule out PUD as she is taking significant daiy amount of Ibuprofen. *  Hx Barretts esophagus in 2012.  Variable compliance with PPI, quite symptomatic with frequent dyspepsia.  *  Hx gastroparesis *  Hx adenomatous colon polyp 2015, none recurrent on 2012 colonoscopy *  Frequent headache.   PLAN: *  EGD today,  discussed with the pt who is agreeable.    LOS: 1 day    Jennye Moccasin  09/17/2012, 9:31 AM Pager: [phone]  Attending MD note:   I have reviewed the above note, examined the patient and agree with plan of treatment. See EGD report. Chest/abdominal pain caused by moderately severe esophagitis and 4 cm hiatal hernia. Would treat with PPI, Carafate slurry. Consider Reglan but hesitant to use in elderly pt's.  [facility] Pager # [phone]

## 2012-09-17 NOTE — Op Note (Signed)
[facility] [address] [place], [address]   ENDOSCOPY PROCEDURE REPORT  PATIENT: Sherri, Mcmillan  MR#: [id number] BIRTHDATE: 03-Jan-1933 , 79  yrs. old GENDER: Female ENDOSCOPIST: Hart Carwin, MD REFERRED BY:  Dr Phillis Haggis PROCEDURE DATE:  09/17/2012 PROCEDURE:  EGD w/ biopsy ASA CLASS:     Class III INDICATIONS:  Nausea.   Vomiting.   history of Barrett's esophagus. EGD 12/2010 showed Barrett's esophagus,has stopped taking PPI's. MEDICATIONS: These medications were titrated to patient response per physician's verbal order, Fentanyl 25 mcg IV, and Versed 4 mg IV TOPICAL ANESTHETIC: Cetacaine Spray  DESCRIPTION OF PROCEDURE: After the risks benefits and alternatives of the procedure were thoroughly explained, informed consent was obtained.  The PENTAX GASTOROSCOPE [id number] endoscope was introduced through the mouth and advanced to the second portion of the duodenum. Without limitations.  The instrument was slowly withdrawn as the mucosa was fully examined.        ESOPHAGUS: A stricture was found at the gastroesophageal junction.Benign appearing fibrotic, bleeding from scope trauma Moderately severe reflux esophagitis was found in the lower third of the esophagus. Several linear erosions in the distal esophagus A biopsy was performed using cold forceps.  Sample obtained to rule out Barrett's esophagus.  STOMACH: A 4 cm hiatal hernia was noted.  DUODENUM: The duodenal mucosa showed no abnormalities in the bulb and second portion of the duodenum.  Retroflexed views revealed no abnormalities.     The scope was then withdrawn from the patient and the procedure completed.  COMPLICATIONS: There were no complications. ENDOSCOPIC IMPRESSION: 1.   Stricture was found at the gastroesophageal junction - appears benign, reflux related 2.   Esophagitis consistent with reflux esophagitis in the lower third of the esophagus; biopsy 3.   4 cm hiatal  hernia 4.   The duodenal mucosa showed no abnormalities in the bulb and second portion of the duodenum  RECOMMENDATIONS: 1.  Await pathology results 2.  Anti-reflux regimen to be follow 3.  Continue PPI  REPEAT EXAM: for EGD pending biopsy results.  eSigned:  Hart Carwin, MD 09/17/2012 12:50 PM   CC:  PATIENT NAME:  Sherri, Mcmillan MR#: [id number]

## 2012-09-17 NOTE — Progress Notes (Signed)
Subjective: Pt had marked pain overnight not very well controlled on GI cocktail- caragate, IV protonix and morphine added with some relief but still uncomfortable epigastric pain for patient. VSS but with some elevated SBP associated with painful episodes. No more vomiting. No SOB, CP, HA, diarrhea, dark tarry stools, or hematemeis/hematochezia.   Objective: Vital signs in last 24 hours: Filed Vitals:   09/16/12 1615 09/16/12 1724 09/16/12 2132 09/17/12 0619  BP: 135/84 156/76 142/79 137/76  Pulse:  57 57 62  Temp: 98.3 F (36.8 C) 99.1 F (37.3 C) 98.5 F (36.9 C) 98.4 F (36.9 C)  TempSrc: Oral Oral Oral Oral  Resp: 20 18 18 18   Height:  5' (1.524 m)    Weight:  129 lb 13.6 oz (58.9 kg)  130 lb 4.7 oz (59.1 kg)  SpO2: 99% 100% 100% 95%   Weight change:   Intake/Output Summary (Last 24 hours) at 09/17/12 0949 Last data filed at 09/17/12 0820  Gross per 24 hour  Intake    360 ml  Output      0 ml  Net    360 ml   General: resting in bed, slightly uncomfortable HEENT: PERRL, EOMI, no scleral icterus, MMM, no oral lesions/ulcers/white exudate Cardiac: RRR, no rubs, 2/6 systolic murmur, no gallops Pulm: clear to auscultation bilaterally, moving normal volumes of air Abd: soft, TTP in epigastrium and ?RUQ but not RUQ with distraction, no rebound or guarding, nondistended, BS present Ext: warm and well perfused, no pedal edema Neuro: alert and oriented X3, cranial nerves II-XII grossly intact  Lab Results: Basic Metabolic Panel:  Recent Labs Lab 09/16/12 0952 09/17/12 0535  NA 137 137  K 4.1 3.8  CL 101 103  CO2 25 25  GLUCOSE 112* 91  BUN 14 10  CREATININE 0.86 0.85  CALCIUM 9.7 9.3   Liver Function Tests:  Recent Labs Lab 09/16/12 0952  AST 16  ALT 11  ALKPHOS 78  BILITOT 0.2*  PROT 7.3  ALBUMIN 3.8    Recent Labs Lab 09/16/12 0952  LIPASE 29   CBC:  Recent Labs Lab 09/16/12 0952 09/17/12 0535  WBC 4.0 3.4*  NEUTROABS 2.1  --   HGB 11.6*  11.8*  HCT 34.2* 35.2*  MCV 86.6 86.9  PLT 179 190   Cardiac Enzymes:  Recent Labs Lab 09/16/12 1823 09/16/12 2335  TROPONINI <0.30 <0.30   Urinalysis:  Recent Labs Lab 09/16/12 1030  COLORURINE YELLOW  LABSPEC 1.016  PHURINE 5.5  GLUCOSEU NEGATIVE  HGBUR NEGATIVE  BILIRUBINUR NEGATIVE  KETONESUR NEGATIVE  PROTEINUR NEGATIVE  UROBILINOGEN 0.2  NITRITE NEGATIVE  LEUKOCYTESUR MODERATE*   Misc. Labs: Lipase     Component Value Date/Time   LIPASE 29 09/16/2012 1610   Studies/Results: Dg Abd Acute W/chest  09/16/2012   *RADIOLOGY REPORT*  Clinical Data: Epigastric abdominal pain radiating through the left side of the chest and into the left mid abdomen.  Intermittent nausea and vomiting and diarrhea over the past 5 days.  History of Barrett's esophagus diagnosed in September, 2012.  ACUTE ABDOMEN SERIES (ABDOMEN 2 VIEW & CHEST 1 VIEW)  Comparison: CT chest and abdomen 05/14/2010.  Two-view chest x-ray 01/28/2011.  Findings: Bowel gas pattern unremarkable without evidence of obstruction or significant ileus.  No evidence of free air or significant air fluid levels on the erect image.  Moderate stool burden.  Sigmoid colon extends upward into the left upper quadrant, containing gas, of normal caliber.  Numerous pelvic phleboliths. No visible opaque urinary  tract calculi.  Slight upper lumbar scoliosis convex left and degenerative changes involving the lower lumbar spine.  Cardiac silhouette normal in size, unchanged.  Thoracic aorta mildly tortuous, unchanged.  Hilar and mediastinal contours otherwise unremarkable.  Lungs clear.  Bronchovascular markings normal.  Pulmonary vascularity normal.  No pneumothorax.  No pleural effusions.  IMPRESSION: No acute abdominal or pulmonary abnormality.   Original Report Authenticated By: Hulan Saas, M.D.   Medications: I have reviewed the patient's current medications. Scheduled Meds: . amLODipine  10 mg Oral Daily  . enoxaparin (LOVENOX)  injection  40 mg Subcutaneous Q24H  . gi cocktail  30 mL Oral Q6H  . pantoprazole (PROTONIX) IV  40 mg Intravenous Q12H  . sodium chloride  3 mL Intravenous Q12H  . sodium chloride  3 mL Intravenous Q12H  . sucralfate  1 g Oral Q6H   Continuous Infusions:  PRN Meds:.sodium chloride, HYDROcodone-acetaminophen, morphine injection, ondansetron (ZOFRAN) IV, ondansetron, sodium chloride Assessment/Plan: # Chest pain likely complicated GERD vs PUD vs Pill esophagitis: Pt initial evaluation in ED described some chest pain but pt states more epigastric in nature and associated with burning sensation and sour taste along with eating as exacerbating factor. Pt taking NSAIDs daily for past 3 days for this pain and UC given protonix 40mg  qd and norco which provided little relief. ACS has been ruled out with serial EKGs showing NSR and negative trops. CXR no infilatrate/dissection/esophageal rupture. Lipase normal therefore making ischemic bowel or pancreatitis less likely. CMet normal indicating no biliary obstructive picture causing abd pain and nausea. Hgb baseline seems to be around 12/13 and during admission has been 11.8. No signs of active bleeding, dark tarry stools, or BRBPR.  -IV morphine 2mg  q4 prn -IV zofran prn nausea -IV protonix 40mg  BID -caragate q6hr -NPO -Abd [place] -FOBT -gi cocktail scheduled q6 -GI consulted for possible endoscopy   #HTN: slight elevations associated with pain. Cont to monitor.  -amlodipine 10mg  qd  #Gastroporesis: confirmed GES 2012. Pt not currently treated. Will wait evaluation of abd pain before starting treatment. Consider adding reglan.   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 Pylori. Pt has quit smoking for past 3 yrs.  -consulted GI as above.   Dispo: Disposition is deferred at this time, awaiting improvement of current medical problems.   Anticipated discharge in approximately 1-2 day(s).   The patient does have a current PCP (LI, NA, MD), therefore will be requiring OPC follow-up after discharge.   The patient does not have transportation limitations that hinder transportation to clinic appointments.  .Services Needed at time of discharge: Y = Yes, Blank = No PT:   OT:   RN:   Equipment:   Other:     LOS: 1 day   Christen Bame 09/17/2012, 9:49 AM Pgr: [phone]

## 2012-09-18 DIAGNOSIS — K3184 Gastroparesis: Secondary | ICD-10-CM

## 2012-09-18 LAB — CBC
Hemoglobin: 11.6 g/dL — ABNORMAL LOW (ref 12.0–15.0)
MCH: 29.2 pg (ref 26.0–34.0)
Platelets: 169 10*3/uL (ref 150–400)
RBC: 3.97 MIL/uL (ref 3.87–5.11)
WBC: 2.9 10*3/uL — ABNORMAL LOW (ref 4.0–10.5)

## 2012-09-18 MED ORDER — METOCLOPRAMIDE HCL 5 MG PO TABS
5.0000 mg | ORAL_TABLET | Freq: Three times a day (TID) | ORAL | Status: DC
  Administered 2012-09-18 – 2012-09-19 (×3): 5 mg via ORAL
  Filled 2012-09-18 (×6): qty 1

## 2012-09-18 NOTE — Progress Notes (Signed)
[name], [name]

## 2012-09-18 NOTE — Progress Notes (Signed)
Utilization review completed.  , RN, BSN. 

## 2012-09-18 NOTE — Progress Notes (Signed)
.  [facility] Progress Note   Subjective  Better but stil lvomits shortly after eating even jello, denies dysphagia   Objective  Large hiatal hernia wirh esophagitis likely causing abd/chedt pain, PPI, Carafate slurry, antireFLUX MEASURES Vital signs in last 24 hours: Temp:  [98.1 F (36.7 C)-99.2 F (37.3 C)] 98.6 F (37 C) (05/26 0612) Pulse Rate:  [56-73] 73 (05/26 0612) Resp:  [12-18] 18 (05/26 0612) BP: (117-182)/(65-97) 144/80 mmHg (05/26 0612) SpO2:  [96 %-100 %] 100 % (05/26 0612) Weight:  [129 lb 6.6 oz (58.7 kg)] 129 lb 6.6 oz (58.7 kg) (05/26 0612) Last BM Date: 09/15/12 General:    white female in NAD Heart:  Regular rate and rhythm; no murmurs Lungs: Respirations even and unlabored, lungs CTA bilaterally Abdomen:  Soft, nontender and nondistended. Normal bowel sounds. Extremities:  Without edema. Neurologic:  Alert and oriented,  grossly normal neurologically. Psych:  Cooperative. Normal mood and affect.  Intake/Output from previous day: 05/25 0701 - 05/26 0700 In: 360 [P.O.:360] Out: -  Intake/Output this shift:    Lab Results:  Recent Labs  09/16/12 0952 09/17/12 0535 09/18/12 0815  WBC 4.0 3.4* 2.9*  HGB 11.6* 11.8* 11.6*  HCT 34.2* 35.2* 34.5*  PLT 179 190 169   BMET  Recent Labs  09/16/12 0952 09/17/12 0535  NA 137 137  K 4.1 3.8  CL 101 103  CO2 25 25  GLUCOSE 112* 91  BUN 14 10  CREATININE 0.86 0.85  CALCIUM 9.7 9.3   LFT  Recent Labs  09/17/12 0535  PROT 7.2  ALBUMIN 3.7  AST 21  ALT 10  ALKPHOS 68  BILITOT 0.3  BILIDIR <0.1  IBILI NOT CALCULATED   PT/INR No results found for this basename: LABPROT, INR,  in the last 72 hours  Studies/Results: US Abdomen Complete  09/17/2012   *RADIOLOGY REPORT*  Clinical Data:  Abdominal pain  COMPLETE ABDOMINAL ULTRASOUND  Comparison:  CT abdomen dated 05/14/2008  Findings:  Gallbladder:  No gallstones, gallbladder wall thickening, or pericholecystic fluid.  Negative  sonographic Murphy's sign.  Common bile duct:  Mildly prominent, measuring 10 mm, similar to prior CT.  No choledocholithiasis is seen.  Liver:  No focal lesion identified.  Within normal limits in parenchymal echogenicity.  IVC:  Appears normal.  Pancreas:  No focal abnormality seen.  Main pancreatic duct measures 3 mm, at the upper limits of normal.  Spleen:  Measures 4.4 cm.  Right Kidney:  Measures 10.1 cm.  No mass or hydronephrosis.  Left Kidney:  Measures 10.6 cm.  No mass or hydronephrosis.  Abdominal aorta:  No aneurysm identified.  IMPRESSION: Common duct measures 10 mm, mildly prominent but grossly unchanged from 2010.  Otherwise negative abdominal ultrasound.   Original Report Authenticated By: Charline Bills, M.D.       Assessment:  SLOWLY IMPROVING, POSSIBLY GASTROPARESUS IS CONTRIBUTING TO INCREASE REFLUX, WILL CONTINUE PPI CARAFATE, ALSO ADD GES FOR TOMORROW     Plan:  GES  Principal Problem:   Chest pain Active Problems:   VENOUS INSUFFICIENCY   Chronic anxiety   HTN (hypertension)   GERD (gastroesophageal reflux disease)   Barrett's esophagus   Gastroparesis   Stricture and stenosis of esophagus   Reflux esophagitis     LOS: 2 days   Lina Sar  09/18/2012, 12:16 PM

## 2012-09-18 NOTE — Progress Notes (Signed)
Subjective: Pt tolerated EGD 5/25 w/o complication. Pt was advanced to regular diet and states ate "4 of the grits/jello" containers and then developed some vomiting afterwards. Otherwise pt was "doing great-better than have been." before breakfast.   Objective: Vital signs in last 24 hours: Filed Vitals:   09/17/12 1255 09/17/12 1331 09/17/12 2113 09/18/12 0612  BP: 128/80 150/75 125/67 144/80  Pulse:  56 59 73  Temp:  98.6 F (37 C) 99.2 F (37.3 C) 98.6 F (37 C)  TempSrc:  Oral Oral Oral  Resp: 13 16 18 18   Height:      Weight:    129 lb 6.6 oz (58.7 kg)  SpO2: 96% 96% 100% 100%   Weight change: -7.1 oz (-0.2 kg)  Intake/Output Summary (Last 24 hours) at 09/18/12 0719 Last data filed at 09/17/12 1300  Gross per 24 hour  Intake    360 ml  Output      0 ml  Net    360 ml   General: resting in bed, slightly uncomfortable HEENT: PERRL, EOMI, no scleral icterus, MMM Cardiac: RRR, no rubs, 2/6 systolic murmur, no gallops Pulm: clear to auscultation bilaterally, moving normal volumes of air Abd: soft, TTP in epigastrium, no rebound or guarding, nondistended, BS present Ext: warm and well perfused, no pedal edema Neuro: alert and oriented X3, cranial nerves II-XII grossly intact  Lab Results: Basic Metabolic Panel:  Recent Labs Lab 09/16/12 0952 09/17/12 0535  NA 137 137  K 4.1 3.8  CL 101 103  CO2 25 25  GLUCOSE 112* 91  BUN 14 10  CREATININE 0.86 0.85  CALCIUM 9.7 9.3   Liver Function Tests:  Recent Labs Lab 09/16/12 0952 09/17/12 0535  AST 16 21  ALT 11 10  ALKPHOS 78 68  BILITOT 0.2* 0.3  PROT 7.3 7.2  ALBUMIN 3.8 3.7    Recent Labs Lab 09/16/12 0952  LIPASE 29   CBC:  Recent Labs Lab 09/16/12 0952 09/17/12 0535  WBC 4.0 3.4*  NEUTROABS 2.1  --   HGB 11.6* 11.8*  HCT 34.2* 35.2*  MCV 86.6 86.9  PLT 179 190   Cardiac Enzymes:  Recent Labs Lab 09/16/12 1823 09/16/12 2335  TROPONINI <0.30 <0.30   Urinalysis:  Recent Labs Lab  09/16/12 1030  COLORURINE YELLOW  LABSPEC 1.016  PHURINE 5.5  GLUCOSEU NEGATIVE  HGBUR NEGATIVE  BILIRUBINUR NEGATIVE  KETONESUR NEGATIVE  PROTEINUR NEGATIVE  UROBILINOGEN 0.2  NITRITE NEGATIVE  LEUKOCYTESUR MODERATE*   Misc. Labs: Lipase     Component Value Date/Time   LIPASE 29 09/16/2012 0952   Studies/Results: US Abdomen Complete  09/17/2012   *RADIOLOGY REPORT*  Clinical Data:  Abdominal pain  COMPLETE ABDOMINAL ULTRASOUND  Comparison:  CT abdomen dated 05/14/2008  Findings:  Gallbladder:  No gallstones, gallbladder wall thickening, or pericholecystic fluid.  Negative sonographic Murphy's sign.  Common bile duct:  Mildly prominent, measuring 10 mm, similar to prior CT.  No choledocholithiasis is seen.  Liver:  No focal lesion identified.  Within normal limits in parenchymal echogenicity.  IVC:  Appears normal.  Pancreas:  No focal abnormality seen.  Main pancreatic duct measures 3 mm, at the upper limits of normal.  Spleen:  Measures 4.4 cm.  Right Kidney:  Measures 10.1 cm.  No mass or hydronephrosis.  Left Kidney:  Measures 10.6 cm.  No mass or hydronephrosis.  Abdominal aorta:  No aneurysm identified.  IMPRESSION: Common duct measures 10 mm, mildly prominent but grossly unchanged from 2010.  Otherwise negative abdominal ultrasound.   Original Report Authenticated By: Charline Bills, M.D.   Dg Abd Acute W/chest  09/16/2012   *RADIOLOGY REPORT*  Clinical Data: Epigastric abdominal pain radiating through the left side of the chest and into the left mid abdomen.  Intermittent nausea and vomiting and diarrhea over the past 5 days.  History of Barrett's esophagus diagnosed in September, 2012.  ACUTE ABDOMEN SERIES (ABDOMEN 2 VIEW & CHEST 1 VIEW)  Comparison: CT chest and abdomen 05/14/2010.  Two-view chest x-ray 01/28/2011.  Findings: Bowel gas pattern unremarkable without evidence of obstruction or significant ileus.  No evidence of free air or significant air fluid levels on the erect  image.  Moderate stool burden.  Sigmoid colon extends upward into the left upper quadrant, containing gas, of normal caliber.  Numerous pelvic phleboliths. No visible opaque urinary tract calculi.  Slight upper lumbar scoliosis convex left and degenerative changes involving the lower lumbar spine.  Cardiac silhouette normal in size, unchanged.  Thoracic aorta mildly tortuous, unchanged.  Hilar and mediastinal contours otherwise unremarkable.  Lungs clear.  Bronchovascular markings normal.  Pulmonary vascularity normal.  No pneumothorax.  No pleural effusions.  IMPRESSION: No acute abdominal or pulmonary abnormality.   Original Report Authenticated By: Hulan Saas, M.D.   Medications: I have reviewed the patient's current medications. Scheduled Meds: . amLODipine  10 mg Oral Daily  . gi cocktail  30 mL Oral Q6H  . pantoprazole (PROTONIX) IV  40 mg Intravenous Q12H  . sodium chloride  3 mL Intravenous Q12H  . sodium chloride  3 mL Intravenous Q12H  . sucralfate  1 g Oral Q6H   Continuous Infusions:  PRN Meds:.sodium chloride, HYDROcodone-acetaminophen, morphine injection, ondansetron (ZOFRAN) IV, ondansetron, sodium chloride Assessment/Plan: # Reflux esophagitis in setting of poorly controlled GERD: Pt initial evaluation in ED described some chest pain but pt states more epigastric in nature and associated with burning sensation and sour taste along with eating as exacerbating factor. Pt taking NSAIDs daily for past 3 days for this pain and UC given protonix 40mg  qd and norco which provided little relief. ACS has been ruled out with serial EKGs showing NSR and negative trops. CXR no infilatrate/dissection/esophageal rupture. Lipase normal therefore making ischemic bowel or pancreatitis less likely. CMet normal indicating no biliary obstructive picture causing abd pain and nausea. Hgb baseline seems to be around 12/13 and during admission has been 11.8. No signs of active bleeding, dark tarry stools,  or BRBPR. EGD on 5/24 showed esophageal stricture, reflux esophagitis, and linear erosions no active bleeding or gastric/duodenal ulcers. Abd [place] dilated CBD but stable since 2010 and otherwise negative.   -IV morphine 2mg  q4 prn -IV zofran prn nausea -IV protonix 40mg  BID -carafate q6hr -clear liquid diet -gi cocktail scheduled q6 -GI consulted greatly appreciate recs: EGD 5/24 rec continuing GERD treatment and pt will need good f/u .   #HTN: slight elevations associated with pain. Cont to monitor.  -amlodipine 10mg  qd  #Gastroporesis: confirmed GES 2012. Pt not currently treated. Will wait evaluation of abd pain before starting treatment.  -will start reglan 5mg  TID AC for a short course and pt has close f/u with PCP and this was discussed with PCP and will re-evaluate at appt and stop course at that time.   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 Pylori. Pt has quit smoking for past 3 yrs.  -EGD w/ biopsies on  5/24  Dispo: Disposition is deferred at this time, awaiting improvement of current medical problems.  Anticipated discharge in approximately 1-2 day(s).   The patient does have a current PCP (LI, NA, MD), therefore will be requiring OPC follow-up after discharge.   The patient does not have transportation limitations that hinder transportation to clinic appointments.  .Services Needed at time of discharge: Y = Yes, Blank = No PT:   OT:   RN:   Equipment:   Other:     LOS: 2 days   Christen Bame 09/18/2012, 7:19 AM Pgr: [phone]

## 2012-09-19 ENCOUNTER — Inpatient Hospital Stay (HOSPITAL_COMMUNITY): Payer: Medicare Other

## 2012-09-19 MED ORDER — TECHNETIUM TC 99M SULFUR COLLOID
2.0000 | Freq: Once | INTRAVENOUS | Status: AC | PRN
  Administered 2012-09-19: 2 via ORAL

## 2012-09-19 MED ORDER — METOCLOPRAMIDE HCL 5 MG PO TABS: 5.0000 mg | ORAL_TABLET | Freq: Three times a day (TID) | ORAL | Status: DC

## 2012-09-19 NOTE — Progress Notes (Signed)
Internal Medicine Teaching Service Attending Note Date: 09/19/2012  Patient name: Sherri Mcmillan  Medical record number: [id number]  Date of birth: [birth year]    This patient has been seen and discussed with the house staff. Please see their note for complete details. I concur with their findings with the following additions/corrections: Ms Balan is going for GES later today. She gives a h/o acute on chronic GI sxs. Will need to document any emesis as there is nothing documented yet pt states she vomited yesterday after taking PO. Also stated 3 weeks weakness - either PT/OT or ask nurse to walk pt.   D/C today or tomorrow.   , 09/19/2012, 1:45 PM

## 2012-09-19 NOTE — Progress Notes (Signed)
     [facility] Daily Rounding Note 09/19/2012, 9:42 AM  SUBJECTIVE:       Overall the esophageal and chest burning is better, but persists. No nausea, no abdominal pain  OBJECTIVE:         Vital signs in last 24 hours:    Temp:  [98.5 F (36.9 C)-98.9 F (37.2 C)] 98.9 F (37.2 C) (05/27 0646) Pulse Rate:  [56-69] 56 (05/26 2100) Resp:  [18] 18 (05/27 0646) BP: (106-151)/(60-69) 151/69 mmHg (05/27 0646) SpO2:  [97 %-100 %] 100 % (05/27 0646) Weight:  [59 kg (130 lb 1.1 oz)] 59 kg (130 lb 1.1 oz) (05/27 0646) Last BM Date: 09/15/12 General:   Looks well ENT:  Hoarse vocals Heart: RRR Chest: clear B Abdomen: soft, ND, NT, active BS.    Extremities: no CCE Neuro/Psych:  depressed, a bit anxious   Lab Results:  Recent Labs  09/16/12 0952 09/17/12 0535 09/18/12 0815  WBC 4.0 3.4* 2.9*  HGB 11.6* 11.8* 11.6*  HCT 34.2* 35.2* 34.5*  PLT 179 190 169   BMET  Recent Labs  09/16/12 0952 09/17/12 0535  NA 137 137  K 4.1 3.8  CL 101 103  CO2 25 25  GLUCOSE 112* 91  BUN 14 10  CREATININE 0.86 0.85  CALCIUM 9.7 9.3   LFT  Recent Labs  09/16/12 0952 09/17/12 0535  PROT 7.3 7.2  ALBUMIN 3.8 3.7  AST 16 21  ALT 11 10  ALKPHOS 78 68  BILITOT 0.2* 0.3  BILIDIR  --  <0.1  IBILI  --  NOT CALCULATED    Studies/Results: US Abdomen Complete 09/17/2012     IMPRESSION: Common duct measures 10 mm, mildly prominent but grossly unchanged from 2010.  Otherwise negative abdominal ultrasound.   Original Report Authenticated By: Charline Bills, M.D.    ASSESMENT: *  Nausea and vomiting. EGD  5/25:  HH, Fibrotic stricture at GE Jx that was dilated by passage of endoscope itself, severe distal reflux esophagitis.  Bx obtained and still pending. Has hx Barretts esophagus on 12/2010 EGD.  On *  Gastroparesis.  For repeat GES study today. .  *  Hx adenomatous colon polyps dating to 2000.  Colonoscopy 12/2010:  Normal study, no polyps:  Follow up in 2022.    PLAN: *   Continue the Reglan 5 mg tid po (just added), carafate qid (just added) *  Change to daily Protonix po (on PTA). *  Await results of GES.  *  mech soft diet once GES completed.      LOS: 3 days   Sherri Mcmillan  09/19/2012, 9:42 AM Pager: [phone]  ________________________________________________________________________  [facility] MD note:  I personally examined the patient, reviewed the data and agree with the assessment and plan described above. GES in process.    Rob Bunting, MD [facility] Pager [phone]

## 2012-09-19 NOTE — Progress Notes (Signed)
Subjective: Pt still complaining of some pain overnight but insisting on advancing diet. Pt was able to tolerate crackers w/o vomiting or pain yesterday but continues to assist in receiving pain medication. Apparently this pt is known to Dr. Rogelia Boga and has exhibited drug seeking behavior in the past as pt is established in [facility] and not approved to be given opiates given hx of psychosis and drug seeking behavior.   Objective: Vital signs in last 24 hours: Filed Vitals:   09/18/12 0612 09/18/12 1400 09/18/12 2100 09/19/12 0646  BP: 144/80 118/60 106/62 151/69  Pulse: 73 69 56   Temp: 98.6 F (37 C) 98.7 F (37.1 C) 98.5 F (36.9 C) 98.9 F (37.2 C)  TempSrc: Oral Oral Oral Oral  Resp: 18 18 18 18   Height:      Weight: 129 lb 6.6 oz (58.7 kg)   130 lb 1.1 oz (59 kg)  SpO2: 100% 97% 100% 100%   Weight change: 10.6 oz (0.3 kg) No intake or output data in the 24 hours ending 09/19/12 0938  General: resting in bed, slightly uncomfortable HEENT: PERRL, EOMI, no scleral icterus, MMM Cardiac: RRR, no rubs, 2/6 systolic murmur, no gallops Pulm: clear to auscultation bilaterally, moving normal volumes of air Abd: soft, non-tender with distraction, no rebound or guarding, nondistended, BS present Ext: warm and well perfused, no pedal edema Neuro: alert and oriented X3, cranial nerves II-XII grossly intact  Lab Results: Basic Metabolic Panel:  Recent Labs Lab 09/16/12 0952 09/17/12 0535  NA 137 137  K 4.1 3.8  CL 101 103  CO2 25 25  GLUCOSE 112* 91  BUN 14 10  CREATININE 0.86 0.85  CALCIUM 9.7 9.3   Liver Function Tests:  Recent Labs Lab 09/16/12 0952 09/17/12 0535  AST 16 21  ALT 11 10  ALKPHOS 78 68  BILITOT 0.2* 0.3  PROT 7.3 7.2  ALBUMIN 3.8 3.7    Recent Labs Lab 09/16/12 0952  LIPASE 29   CBC:  Recent Labs Lab 09/16/12 0952 09/17/12 0535 09/18/12 0815  WBC 4.0 3.4* 2.9*  NEUTROABS 2.1  --   --   HGB 11.6* 11.8* 11.6*  HCT 34.2* 35.2* 34.5*  MCV  86.6 86.9 86.9  PLT 179 190 169   Cardiac Enzymes:  Recent Labs Lab 09/16/12 1823 09/16/12 2335  TROPONINI <0.30 <0.30   Urinalysis:  Recent Labs Lab 09/16/12 1030  COLORURINE YELLOW  LABSPEC 1.016  PHURINE 5.5  GLUCOSEU NEGATIVE  HGBUR NEGATIVE  BILIRUBINUR NEGATIVE  KETONESUR NEGATIVE  PROTEINUR NEGATIVE  UROBILINOGEN 0.2  NITRITE NEGATIVE  LEUKOCYTESUR MODERATE*   Misc. Labs: Lipase     Component Value Date/Time   LIPASE 29 09/16/2012 0952   Studies/Results: US Abdomen Complete  09/17/2012   *RADIOLOGY REPORT*  Clinical Data:  Abdominal pain  COMPLETE ABDOMINAL ULTRASOUND  Comparison:  CT abdomen dated 05/14/2008  Findings:  Gallbladder:  No gallstones, gallbladder wall thickening, or pericholecystic fluid.  Negative sonographic Murphy's sign.  Common bile duct:  Mildly prominent, measuring 10 mm, similar to prior CT.  No choledocholithiasis is seen.  Liver:  No focal lesion identified.  Within normal limits in parenchymal echogenicity.  IVC:  Appears normal.  Pancreas:  No focal abnormality seen.  Main pancreatic duct measures 3 mm, at the upper limits of normal.  Spleen:  Measures 4.4 cm.  Right Kidney:  Measures 10.1 cm.  No mass or hydronephrosis.  Left Kidney:  Measures 10.6 cm.  No mass or hydronephrosis.  Abdominal aorta:  No aneurysm identified.  IMPRESSION: Common duct measures 10 mm, mildly prominent but grossly unchanged from 2010.  Otherwise negative abdominal ultrasound.   Original Report Authenticated By: Charline Bills, M.D.   Medications: I have reviewed the patient's current medications. Scheduled Meds: . amLODipine  10 mg Oral Daily  . gi cocktail  30 mL Oral Q6H  . metoCLOPramide  5 mg Oral TID AC  . pantoprazole (PROTONIX) IV  40 mg Intravenous Q12H  . sodium chloride  3 mL Intravenous Q12H  . sucralfate  1 g Oral Q6H   Continuous Infusions:  PRN Meds:.sodium chloride, HYDROcodone-acetaminophen, ondansetron (ZOFRAN) IV,  ondansetron Assessment/Plan: # Reflux esophagitis in setting of poorly controlled GERD: Pt initial evaluation in ED described some chest pain but pt states more epigastric in nature and associated with burning sensation and sour taste along with eating as exacerbating factor. Pt taking NSAIDs daily for past 3 days for this pain and UC given protonix 40mg  qd and norco which provided little relief. ACS has been ruled out with serial EKGs showing NSR and negative trops. CXR no infilatrate/dissection/esophageal rupture. Lipase normal therefore making ischemic bowel or pancreatitis less likely. CMet normal indicating no biliary obstructive picture causing abd pain and nausea. Hgb baseline seems to be around 12/13 and during admission has been 11.8. No signs of active bleeding, dark tarry stools, or BRBPR. EGD on 5/24 showed esophageal stricture, reflux esophagitis, and linear erosions no active bleeding or gastric/duodenal ulcers. Abd [place] dilated CBD but stable since 2010 and otherwise negative.   -IV morphine 2mg  q4 prn -IV zofran prn nausea -IV protonix 40mg  BID -carafate q6hr -NPO for GES scheduled 5/27 -gi cocktail scheduled q6 -GI consulted greatly appreciate recs: EGD 5/24 rec continuing GERD treatment and pt will need good f/u .   #HTN: slight elevations associated with pain. Cont to monitor.  -amlodipine 10mg  qd  #Gastroporesis: confirmed GES 2012. Pt not currently treated. Will wait evaluation of abd pain before starting treatment.  -will start reglan 5mg  TID AC for a short course and pt has close f/u with PCP and this was discussed with PCP and will re-evaluate at appt and stop course at that time.  -GES per GI 5/27  #Barret's esophagus: confirmed by EGD in 2012 pathology goblet cell metaplasia but no dysplasia or malignancy, chronic reflux changes, and negative H. Pylori. Pt has quit smoking for past 3 yrs.  -EGD w/ biopsies on 5/24  Dispo: Disposition is deferred at this time, awaiting  improvement of current medical problems.  Anticipated discharge in approximately 1-2 day(s).   The patient does have a current PCP ([facility], NA, MD), therefore will be requiring OPC follow-up after discharge.   The patient does not have transportation limitations that hinder transportation to clinic appointments.  .Services Needed at time of discharge: Y = Yes, Blank = No PT:   OT:   RN:   Equipment:   Other:     LOS: 3 days   Christen Bame 09/19/2012, 9:38 AM Pgr: [phone]

## 2012-09-20 ENCOUNTER — Encounter (HOSPITAL_COMMUNITY): Payer: Self-pay | Admitting: Internal Medicine

## 2012-09-26 NOTE — Discharge Summary (Signed)
Internal Medicine Teaching [facility] Discharge Note  Name: Sherri Mcmillan MRN: [id number] DOB: 1932-08-16 79 y.o.  Date of Admission: 09/16/2012  8:53 AM Date of Discharge: 09/19/12 Attending Physician: Dr. Rogelia Boga  Discharge Diagnosis: Principal Problem:   Chest pain Active Problems:   VENOUS INSUFFICIENCY   Chronic anxiety   HTN (hypertension)   GERD (gastroesophageal reflux disease)   Barrett's esophagus   Gastroparesis   Stricture and stenosis of esophagus   Reflux esophagitis   Discharge Medications:   Medication List    TAKE these medications       amLODipine 10 MG tablet  Commonly known as:  NORVASC  Take 1 tablet (10 mg total) by mouth daily.     furosemide 40 MG tablet  Commonly known as:  LASIX  Take 60 mg by mouth daily.     HYDROcodone-acetaminophen 5-325 MG per tablet  Commonly known as:  NORCO  Take 0.5-1 tablets by mouth every 4 (four) hours as needed for pain.     metoCLOPramide 5 MG tablet  Commonly known as:  REGLAN  Take 1 tablet (5 mg total) by mouth 3 (three) times daily before meals.     pantoprazole 40 MG tablet  Commonly known as:  PROTONIX  Take 1 tablet (40 mg total) by mouth daily.     promethazine 12.5 MG tablet  Commonly known as:  PHENERGAN  Take 12.5 mg by mouth every 6 (six) hours as needed for nausea.        Disposition and follow-up:   Sherri Mcmillan was discharged from [facility] in Stable condition.  At the hospital follow up visit please address: 1. Cont reglan use 2. HTN 3. GERD treatment 4. EGD bx results  Follow-up Appointments:       Future Appointments Provider Department Dept Phone   09/28/2012 8:45 AM Ian Bushman Dierdre Searles, MD [facility] [phone]      Consultations: Treatment Team:  Hart Carwin, MD  Procedures Performed:  Nm Gastric Emptying  09/19/2012   *RADIOLOGY REPORT*  Clinical data: Abdominal pain, nausea, vomiting, diabetes, reflux, hypertension  NUCLEAR  MEDICINE GASTRIC EMPTYING EXAM:  Radiopharmaceutical:  2 mCi Tc-40m sulfur colloid labeled egg whites  Technique: The patient ingested a standardized meal containing radiolabeled egg whites. Imaging was performed in the anterior projection for 120 minutes. Gastric emptying is calculated from the obtained images.  Findings: Subjectively normal emptying of tracer from stomach during 2 hours of imaging. Quantitative analysis reveals 69% emptying at 1 hour and 91% emptying at 2 hours.  Calculated values represent normal gastric emptying.  IMPRESSION: Normal gastric emptying. Gastric emptying has significantly improved from the 51% emptying seen at 2 hours on the prior exam.   Original Report Authenticated By: Ulyses Southward, M.D.   US Abdomen Complete  09/17/2012   *RADIOLOGY REPORT*  Clinical Data:  Abdominal pain  COMPLETE ABDOMINAL ULTRASOUND  Comparison:  CT abdomen dated 05/14/2008  Findings:  Gallbladder:  No gallstones, gallbladder wall thickening, or pericholecystic fluid.  Negative sonographic Murphy's sign.  Common bile duct:  Mildly prominent, measuring 10 mm, similar to prior CT.  No choledocholithiasis is seen.  Liver:  No focal lesion identified.  Within normal limits in parenchymal echogenicity.  IVC:  Appears normal.  Pancreas:  No focal abnormality seen.  Main pancreatic duct measures 3 mm, at the upper limits of normal.  Spleen:  Measures 4.4 cm.  Right Kidney:  Measures 10.1 cm.  No mass or hydronephrosis.  Left  Kidney:  Measures 10.6 cm.  No mass or hydronephrosis.  Abdominal aorta:  No aneurysm identified.  IMPRESSION: Common duct measures 10 mm, mildly prominent but grossly unchanged from 2010.  Otherwise negative abdominal ultrasound.   Original Report Authenticated By: Charline Bills, M.D.   Dg Abd Acute W/chest  09/16/2012   *RADIOLOGY REPORT*  Clinical Data: Epigastric abdominal pain radiating through the left side of the chest and into the left mid abdomen.  Intermittent nausea and  vomiting and diarrhea over the past 5 days.  History of Barrett's esophagus diagnosed in September, 2012.  ACUTE ABDOMEN SERIES (ABDOMEN 2 VIEW & CHEST 1 VIEW)  Comparison: CT chest and abdomen 05/14/2010.  Two-view chest x-ray 01/28/2011.  Findings: Bowel gas pattern unremarkable without evidence of obstruction or significant ileus.  No evidence of free air or significant air fluid levels on the erect image.  Moderate stool burden.  Sigmoid colon extends upward into the left upper quadrant, containing gas, of normal caliber.  Numerous pelvic phleboliths. No visible opaque urinary tract calculi.  Slight upper lumbar scoliosis convex left and degenerative changes involving the lower lumbar spine.  Cardiac silhouette normal in size, unchanged.  Thoracic aorta mildly tortuous, unchanged.  Hilar and mediastinal contours otherwise unremarkable.  Lungs clear.  Bronchovascular markings normal.  Pulmonary vascularity normal.  No pneumothorax.  No pleural effusions.  IMPRESSION: No acute abdominal or pulmonary abnormality.   Original Report Authenticated By: Hulan Saas, M.D.    Admission HPI: Sherri Mcmillan 79 yo AA woman pmh of Barrett's esophagus on EGD 9/12, HTN, GERD, and gastroparesis confirmed by GES 9/12 presents with ongoing abdominal pain. Pt states that some acute diarrhea happened 3 days PTA and then spontaneously resolved not associated with any hematochezia or BRBPR or mucus. Pt has had ongoing nausea also since that time along with an associated burning pain that starts in epigastrium and radiates to her mouth leaving a "nasty metallic taste" overnight. It is hard to complete meals without nausea or vomiting despite having a very healthy appetite. She has not noticed any hematemesis or weight loss. No CP or SOB, DOE, PND, LE edema, or new rashes. Epigastric pain in the epigastrium worse when eating. Pt has tried OTC antiacids with little relief and presented to UC center for similar frustrations of ongoing  heartburn and was just recently started on protonix with little relief. Pt denied any sick contacts, has stopped smoking now for 3 years, no recent travel, or other drug or alcohol use.    Hospital Course by problem list: # Reflux esophagitis in setting of poorly controlled GERD: Pt initial evaluation in ED described some chest pain but pt states more epigastric in nature and associated with burning sensation and sour taste along with eating as exacerbating factor. Pt taking NSAIDs daily for past 3 days for this pain and UC given protonix 40mg  qd and norco which provided little relief. ACS has been ruled out with serial EKGs showing NSR and negative trops. CXR no infilatrate/dissection/esophageal rupture. Lipase normal therefore making ischemic bowel or pancreatitis less likely. CMet normal indicating no biliary obstructive picture causing abd pain and nausea. Hgb baseline seems to be around 12/13 and during admission has been 11.8. No signs of active bleeding, dark tarry stools, or BRBPR. EGD on 5/24 showed esophageal stricture, reflux esophagitis, and linear erosions no active bleeding or gastric/duodenal ulcers. Abd [place] dilated CBD but stable since 2010 and otherwise negative. Pt was managed on IV morphine, zofran and protonix and carafate. GI  consulted performed EGD 5/24 rec continuing GERD treatment and pt will need good f/u as outpt.   #HTN: slight elevations associated with pain. Continued home dose amlodipine 10mg  qd.  #Gastroporesis: confirmed by GES 2012. Pt not currently treated. Will start reglan 5mg  TID AC for a short course and pt has close f/u with PCP and this was discussed with PCP and will re-evaluate at appt and stop course at that time. Repeat GES per GI 5/27 results pending upon d/c.   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 Pylori. Pt has quit smoking for past 3 yrs. EGD w/  biopsies on 5/24.   #Pain seeking behavior: Pt continually bargained with pain medication and food and even when tolerating po refused to take po pain medication. This pt was well known to Dr. Rogelia Boga and has had significant narcotic and anxiolytic medication abuse in the past as well as documented psychosis with refusal to see psychiatry. Therefore no pain medication or anxiolytics were provided to the pt upon d/c.    Discharge Vitals:  BP 151/69  Pulse 56  Temp(Src) 98.9 F (37.2 C) (Oral)  Resp 18  Ht 5' (1.524 m)  Wt 130 lb 1.1 oz (59 kg)  BMI 25.4 kg/m2  SpO2 100%  LMP 04/26/1950 General: resting in bed, slightly uncomfortable  HEENT: PERRL, EOMI, no scleral icterus, MMM  Cardiac: RRR, no rubs, 2/6 systolic murmur, no gallops  Pulm: clear to auscultation bilaterally, moving normal volumes of air  Abd: soft, non-tender with distraction, no rebound or guarding, nondistended, BS present  Ext: warm and well perfused, no pedal edema  Neuro: alert and oriented X3, cranial nerves II-XII grossly intact  Discharge Labs: No results found for this or any previous visit (from the past 24 hour(s)).  Signed: Christen Bame 09/26/2012, 7:48 AM   Time Spent on Discharge: 35 min Services Ordered on Discharge: none Equipment Ordered on Discharge: none

## 2012-09-28 ENCOUNTER — Inpatient Hospital Stay (HOSPITAL_COMMUNITY)
Admission: AD | Admit: 2012-09-28 | Discharge: 2012-10-03 | DRG: 301 | Disposition: A | Discharge status: Home Health | Payer: Medicare Other | Source: Ambulatory Visit | Attending: Internal Medicine | Admitting: Internal Medicine

## 2012-09-28 ENCOUNTER — Encounter (HOSPITAL_COMMUNITY): Payer: Self-pay | Admitting: General Practice

## 2012-09-28 ENCOUNTER — Encounter: Payer: Self-pay | Admitting: Internal Medicine

## 2012-09-28 ENCOUNTER — Ambulatory Visit (HOSPITAL_COMMUNITY)
Admission: RE | Admit: 2012-09-28 | Discharge: 2012-09-28 | Disposition: A | Discharge status: Home / Self Care | Payer: Medicare Other | Source: Ambulatory Visit | Attending: Internal Medicine | Admitting: Internal Medicine

## 2012-09-28 ENCOUNTER — Ambulatory Visit (INDEPENDENT_AMBULATORY_CARE_PROVIDER_SITE_OTHER): Payer: Medicare Other | Admitting: Internal Medicine

## 2012-09-28 VITALS — BP 101/58 | HR 69 | Temp 97.0°F | Ht 60.25 in | Wt 137.2 lb

## 2012-09-28 DIAGNOSIS — K3184 Gastroparesis: Secondary | ICD-10-CM | POA: Diagnosis present

## 2012-09-28 DIAGNOSIS — I10 Essential (primary) hypertension: Secondary | ICD-10-CM

## 2012-09-28 DIAGNOSIS — D649 Anemia, unspecified: Secondary | ICD-10-CM | POA: Diagnosis present

## 2012-09-28 DIAGNOSIS — Z87891 Personal history of nicotine dependence: Secondary | ICD-10-CM

## 2012-09-28 DIAGNOSIS — K2270 Barrett's esophagus without dysplasia: Secondary | ICD-10-CM | POA: Diagnosis present

## 2012-09-28 DIAGNOSIS — G8929 Other chronic pain: Secondary | ICD-10-CM | POA: Diagnosis present

## 2012-09-28 DIAGNOSIS — G894 Chronic pain syndrome: Secondary | ICD-10-CM

## 2012-09-28 DIAGNOSIS — F3289 Other specified depressive episodes: Secondary | ICD-10-CM | POA: Diagnosis present

## 2012-09-28 DIAGNOSIS — M79609 Pain in unspecified limb: Secondary | ICD-10-CM

## 2012-09-28 DIAGNOSIS — G5790 Unspecified mononeuropathy of unspecified lower limb: Secondary | ICD-10-CM | POA: Diagnosis present

## 2012-09-28 DIAGNOSIS — I82409 Acute embolism and thrombosis of unspecified deep veins of unspecified lower extremity: Principal | ICD-10-CM | POA: Diagnosis present

## 2012-09-28 DIAGNOSIS — F411 Generalized anxiety disorder: Secondary | ICD-10-CM | POA: Diagnosis present

## 2012-09-28 DIAGNOSIS — I872 Venous insufficiency (chronic) (peripheral): Secondary | ICD-10-CM

## 2012-09-28 DIAGNOSIS — G43909 Migraine, unspecified, not intractable, without status migrainosus: Secondary | ICD-10-CM | POA: Diagnosis present

## 2012-09-28 DIAGNOSIS — K219 Gastro-esophageal reflux disease without esophagitis: Secondary | ICD-10-CM

## 2012-09-28 DIAGNOSIS — F329 Major depressive disorder, single episode, unspecified: Secondary | ICD-10-CM | POA: Diagnosis present

## 2012-09-28 DIAGNOSIS — I82402 Acute embolism and thrombosis of unspecified deep veins of left lower extremity: Secondary | ICD-10-CM

## 2012-09-28 DIAGNOSIS — M1710 Unilateral primary osteoarthritis, unspecified knee: Secondary | ICD-10-CM | POA: Diagnosis present

## 2012-09-28 DIAGNOSIS — M7989 Other specified soft tissue disorders: Secondary | ICD-10-CM

## 2012-09-28 HISTORY — DX: Acute embolism and thrombosis of unspecified deep veins of left lower extremity: I82.402

## 2012-09-28 HISTORY — DX: Barrett's esophagus without dysplasia: K22.70

## 2012-09-28 LAB — BASIC METABOLIC PANEL
BUN: 17 mg/dL (ref 6–23)
CO2: 29 mEq/L (ref 19–32)
Calcium: 9.6 mg/dL (ref 8.4–10.5)
Creatinine, Ser: 0.87 mg/dL (ref 0.50–1.10)
GFR calc non Af Amer: 63 mL/min — ABNORMAL LOW (ref >90)
Glucose, Bld: 92 mg/dL (ref 70–99)

## 2012-09-28 LAB — CBC
HCT: 33.9 % — ABNORMAL LOW (ref 36.0–46.0)
Hemoglobin: 11.3 g/dL — ABNORMAL LOW (ref 12.0–15.0)
MCH: 29.1 pg (ref 26.0–34.0)
MCHC: 33.3 g/dL (ref 30.0–36.0)
MCV: 87.4 fL (ref 78.0–100.0)
RBC: 3.88 MIL/uL (ref 3.87–5.11)

## 2012-09-28 LAB — PROTIME-INR: Prothrombin Time: 12.9 seconds (ref 11.6–15.2)

## 2012-09-28 MED ORDER — PANTOPRAZOLE SODIUM 40 MG PO TBEC: 40.0000 mg | DELAYED_RELEASE_TABLET | Freq: Every day | ORAL | Status: DC

## 2012-09-28 MED ORDER — TRAMADOL HCL 50 MG PO TABS: 50.0000 mg | ORAL_TABLET | Freq: Four times a day (QID) | ORAL | Status: DC | PRN

## 2012-09-28 MED ORDER — WARFARIN SODIUM 5 MG PO TABS
5.0000 mg | ORAL_TABLET | Freq: Once | ORAL | Status: AC
  Administered 2012-09-28: 5 mg via ORAL
  Filled 2012-09-28: qty 1

## 2012-09-28 MED ORDER — ENOXAPARIN SODIUM 60 MG/0.6ML ~~LOC~~ SOLN
60.0000 mg | Freq: Two times a day (BID) | SUBCUTANEOUS | Status: DC
  Administered 2012-09-28 – 2012-09-29 (×3): 60 mg via SUBCUTANEOUS
  Filled 2012-09-28 (×5): qty 0.6

## 2012-09-28 MED ORDER — METOCLOPRAMIDE HCL 5 MG PO TABS
5.0000 mg | ORAL_TABLET | Freq: Three times a day (TID) | ORAL | Status: DC
  Administered 2012-09-28 – 2012-10-03 (×14): 5 mg via ORAL
  Filled 2012-09-28 (×18): qty 1

## 2012-09-28 MED ORDER — COUMADIN BOOK
Freq: Once | Status: AC
  Administered 2012-09-28: 18:00:00
  Filled 2012-09-28: qty 1

## 2012-09-28 MED ORDER — SENNOSIDES-DOCUSATE SODIUM 8.6-50 MG PO TABS
1.0000 | ORAL_TABLET | Freq: Two times a day (BID) | ORAL | Status: DC
  Administered 2012-09-28 – 2012-10-02 (×9): 1 via ORAL
  Filled 2012-09-28 (×9): qty 1

## 2012-09-28 MED ORDER — METOCLOPRAMIDE HCL 5 MG PO TABS: 5.0000 mg | ORAL_TABLET | Freq: Three times a day (TID) | ORAL | Status: DC

## 2012-09-28 MED ORDER — SODIUM CHLORIDE 0.9 % IV SOLN: 250.0000 mL | INTRAVENOUS | Status: DC | PRN

## 2012-09-28 MED ORDER — PROMETHAZINE HCL 12.5 MG PO TABS: 12.5000 mg | ORAL_TABLET | Freq: Four times a day (QID) | ORAL | Status: DC | PRN

## 2012-09-28 MED ORDER — WARFARIN - PHARMACIST DOSING INPATIENT: Freq: Every day | Status: DC

## 2012-09-28 MED ORDER — PANTOPRAZOLE SODIUM 40 MG PO TBEC
40.0000 mg | DELAYED_RELEASE_TABLET | Freq: Every day | ORAL | Status: DC
  Administered 2012-09-28 – 2012-10-02 (×5): 40 mg via ORAL
  Filled 2012-09-28 (×5): qty 1

## 2012-09-28 MED ORDER — AMLODIPINE BESYLATE 10 MG PO TABS: 10.0000 mg | ORAL_TABLET | Freq: Every day | ORAL | Status: DC

## 2012-09-28 MED ORDER — SODIUM CHLORIDE 0.9 % IJ SOLN
3.0000 mL | Freq: Two times a day (BID) | INTRAMUSCULAR | Status: DC
Start: 2012-09-28 — End: 2012-10-03
  Administered 2012-09-28 – 2012-10-02 (×7): 3 mL via INTRAVENOUS

## 2012-09-28 MED ORDER — HYDROCODONE-ACETAMINOPHEN 5-325 MG PO TABS
1.0000 | ORAL_TABLET | Freq: Four times a day (QID) | ORAL | Status: DC | PRN
  Administered 2012-09-28 (×2): 1 via ORAL
  Filled 2012-09-28 (×2): qty 1

## 2012-09-28 MED ORDER — HYDROCODONE-ACETAMINOPHEN 5-325 MG PO TABS: 2.0000 | ORAL_TABLET | Freq: Four times a day (QID) | ORAL | Status: DC | PRN

## 2012-09-28 MED ORDER — AMLODIPINE BESYLATE 10 MG PO TABS
10.0000 mg | ORAL_TABLET | Freq: Every day | ORAL | Status: DC
  Administered 2012-09-28 – 2012-10-02 (×5): 10 mg via ORAL
  Filled 2012-09-28 (×6): qty 1

## 2012-09-28 MED ORDER — FUROSEMIDE 40 MG PO TABS
60.0000 mg | ORAL_TABLET | Freq: Every day | ORAL | Status: DC
  Administered 2012-09-28 – 2012-10-02 (×5): 60 mg via ORAL
  Filled 2012-09-28 (×6): qty 1

## 2012-09-28 MED ORDER — MORPHINE SULFATE 2 MG/ML IJ SOLN
1.0000 mg | INTRAMUSCULAR | Status: DC | PRN
  Administered 2012-09-28: 2 mg via INTRAVENOUS
  Filled 2012-09-28 (×2): qty 1

## 2012-09-28 MED ORDER — SUCRALFATE 1 G PO TABS: 1.0000 g | ORAL_TABLET | Freq: Four times a day (QID) | ORAL | Status: DC

## 2012-09-28 MED ORDER — WARFARIN VIDEO
Freq: Once | Status: DC
  Filled 2012-09-28: qty 1

## 2012-09-28 MED ORDER — FUROSEMIDE 40 MG PO TABS: 60.0000 mg | ORAL_TABLET | Freq: Every day | ORAL | Status: DC

## 2012-09-28 MED ORDER — PROMETHAZINE HCL 25 MG PO TABS
12.5000 mg | ORAL_TABLET | Freq: Four times a day (QID) | ORAL | Status: DC | PRN
  Administered 2012-09-30 – 2012-10-01 (×3): 12.5 mg via ORAL
  Filled 2012-09-28 (×3): qty 1

## 2012-09-28 MED ORDER — SODIUM CHLORIDE 0.9 % IJ SOLN
3.0000 mL | INTRAMUSCULAR | Status: DC | PRN
  Administered 2012-10-01: 3 mL via INTRAVENOUS

## 2012-09-28 MED ORDER — SUCRALFATE 1 GM/10ML PO SUSP
1.0000 g | Freq: Three times a day (TID) | ORAL | Status: DC
  Administered 2012-09-28 – 2012-10-03 (×19): 1 g via ORAL
  Filled 2012-09-28 (×23): qty 10

## 2012-09-28 NOTE — Progress Notes (Signed)
Subjective:   Patient ID: Sherri Mcmillan female   DOB: 04/04/1933 79 y.o.   MRN: [id number]  HPI: Sherri Mcmillan is a 79 y.o. woman with PMH of hypertension, GERD, Barrett's esophagus, gastroparesis, migraine, chronic anxiety, chronic insomnia, depression, and chronic pain, who presents to the clinic for hospital followup. Patient was admitted for evaluation of chest pain, and was found to have severe distal reflux esophagitis with a biopsy showing "inflamed a gastroesophageal junctional mucosa with surface fibrinous material, suggestive of ulcer. There is no evidence of goblet cell metaplasia, dysplasia or malignancy."  Subsequently, she underwent GS study with results pending.  [facility]. Dr. Kandis Cocking protonic 40 mg daily, Reglan 5 mg 3 times a day and Carafate 1 tablet 4 times a day upon discharge.  She is here for followup.   She reports that her symptoms have largely resolved.  She feels much better except  for occasional heartburn and acid reflux.       Past Medical History  Diagnosis Date  . Chronic pain     DJD knees, back, migranes, LLE varicose veins, and LLE neuropathy.   . Aortic stenosis     Dx ECHO 2008 , mild and aymptomatic , needs ECHO q 3-5 yrs  . GERD (gastroesophageal reflux disease)     Barrett's esophagus demonstrated on EGD 12/2010 by Dr Arlyce Dice. Needs repeat EGD 12/2011  . Chronic insomnia   . Chronic anxiety     Admission to [facility] (90s or early 2000)  for 6 weeks after mother died. Complicated again by death of her sister 2010. 2012 developed hallucinations and I refused to refill controlled meds unless she see psych which she is not agreable o  . Elevated cholesterol 10/11    LDL 155. Her 10 year risk (decreasing her age to 43 as the calculator won't go to age 64) is 21% ish. If consider her non smoker (which I wouldn't even though she says 1 pak lasts 2 weeks) risk is 12% ish. So goal for LDL is 130 or 100.  Marland Kitchen HTN (hypertension)     Controlled with 2 drug therapy   . Migraine   . Cataract   . Depression   . Gastroparesis     Demonstrated on GES 12/2010 by Dr Dalene Seltzer  . Bronchitis     hx of   Current Outpatient Prescriptions  Medication Sig Dispense Refill  . amLODipine (NORVASC) 10 MG tablet Take 1 tablet (10 mg total) by mouth daily.  90 tablet  4  . furosemide (LASIX) 40 MG tablet Take 1.5 tablets (60 mg total) by mouth daily.  135 tablet  4  . HYDROcodone-acetaminophen (NORCO) 5-325 MG per tablet Take 0.5-1 tablets by mouth every 4 (four) hours as needed for pain.  10 tablet  0  . metoCLOPramide (REGLAN) 5 MG tablet Take 1 tablet (5 mg total) by mouth 3 (three) times daily before meals.  90 tablet  1  . pantoprazole (PROTONIX) 40 MG tablet Take 1 tablet (40 mg total) by mouth daily.  90 tablet  4  . promethazine (PHENERGAN) 12.5 MG tablet Take 1 tablet (12.5 mg total) by mouth every 6 (six) hours as needed for nausea.  30 tablet  1  . sucralfate (CARAFATE) 1 G tablet Take 1 tablet (1 g total) by mouth 4 (four) times daily.  120 tablet  4  . traMADol (ULTRAM) 50 MG tablet Take 1 tablet (50 mg total) by mouth every 6 (six) hours as needed for pain.  30 tablet  0  . sucralfate (CARAFATE) 1 GM/10ML suspension Take 10 mLs (1 g total) by mouth 4 (four) times daily.  420 mL  0   No current facility-administered medications for this visit.   Family History  Problem Relation Age of Onset  . Stroke Neg Hx   . Cancer Neg Hx   . Colon cancer Neg Hx   . Anesthesia problems Neg Hx   . Hypotension Neg Hx   . Malignant hyperthermia Neg Hx   . Pseudochol deficiency Neg Hx   . Heart disease Mother   . Hypertension Mother    History   Social History  . Marital Status: Single    Spouse Name: N/A    Number of Children: N/A  . Years of Education: N/A   Social History Main Topics  . Smoking status: Former Smoker    Types: Cigarettes    Quit date: 06/01/2010  . Smokeless tobacco: Never Used  . Alcohol Use: No  . Drug Use: No  . Sexually Active: No    Other Topics Concern  . 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   HEENT:  Denies congestion, sore throat, rhinorrhea, sneezing, mouth sores, trouble swallowing, neck pain   Respiratory:  Denies SOB, DOE, cough, and wheezing.   Cardiovascular:  Denies palpitations and leg swelling.   Gastrointestinal:  Denies nausea, vomiting, abdominal pain, diarrhea, constipation, blood in stool and abdominal distention.  Positive for heartburn and acid reflux   Genitourinary:  Denies dysuria, urgency, frequency, hematuria, flank pain and difficulty urinating.   Musculoskeletal:  Denies myalgias, back pain, arthralgias and gait problem. LLE swelling, tender to touch   Skin:  Denies pallor, rash and wound.   Neurological:  Denies dizziness, seizures, syncope, weakness, light-headedness, numbness and headaches.    .    Objective:  Physical Exam: Filed Vitals:   09/28/12 0858  BP: 101/58  Pulse: 69  Temp: 97 F (36.1 C)  TempSrc: Oral  Height: 5' 0.25" (1.53 m)  Weight: 137 lb 3.2 oz (62.234 kg)  SpO2: 100%   General: alert, well-developed, and cooperative to examination.  Head: normocephalic and atraumatic.  Eyes: vision grossly intact, pupils equal, pupils round, pupils reactive to light, no injection and anicteric.  Mouth: pharynx pink and moist, no erythema, and no exudates.  Neck: supple, full ROM, no thyromegaly, no JVD, and no carotid bruits.  Lungs: normal respiratory effort, no accessory muscle use, normal breath sounds, no crackles, and no  wheezes. Heart: normal rate, regular rhythm, no murmur, no gallop, and no rub.  Abdomen: soft, nontender, normal bowel sounds, no distention, no guarding, no rebound tenderness, no hepatomegaly, and no splenomegaly.  Msk: no joint swelling, no joint warmth, and no redness over joints.  Pulses: 2+ DP/PT pulses bilaterally Extremities: No cyanosis, clubbing. LLE swelling to mid calf. Tenderness to touch. Homan sign positive  Neurologic: alert & oriented X3, cranial nerves II-XII intact, strength normal in all extremities, sensation intact to light touch, and  gait normal.  Skin: turgor normal and no rashes.  Psych: Oriented X3, memory intact for recent and remote, normally interactive, good eye contact, not anxious appearing, and not depressed appearing.    Assessment & Plan:

## 2012-09-28 NOTE — Progress Notes (Signed)
VASCULAR LAB PRELIMINARY  PRELIMINARY  PRELIMINARY  PRELIMINARY  Left lower extremity venous Doppler completed.    Preliminary report:  There is acute DVT noted in the left soleal vein.  There is superficial thrombophlebitis noted throughout the left posterior calf.  , , RVT 09/28/2012, 11:03 AM

## 2012-09-28 NOTE — Progress Notes (Signed)
Pt aware of appt with Dr Arlyce Dice 10/02/12 9:15AM [facility]. Stanton Kidney  RN   09/28/12 10AM

## 2012-09-28 NOTE — Progress Notes (Signed)
ANTICOAGULATION CONSULT NOTE - Initial Consult  Pharmacy Consult:  Lovenox / Coumadin (Overlap D#1/5) Indication:  +DVT in left soleal vein  Allergies  Allergen Reactions  . Milk-Related Compounds   . Aspirin Rash    Patient Measurements: Height: 5' 0.24" (153 cm) Weight: 137 lb 2 oz (62.2 kg) IBW/kg (Calculated) : 46.04  Vital Signs: Temp: 98 F (36.7 C) (06/05 1350) Temp src: Oral (06/05 1350) BP: 130/75 mmHg (06/05 1350) Pulse Rate: 65 (06/05 1350)  Labs: No results found for this basename: HGB, HCT, PLT, APTT, LABPROT, INR, HEPARINUNFRC, CREATININE, CKTOTAL, CKMB, TROPONINI,  in the last 72 hours  Estimated Creatinine Clearance: 45.9 ml/min (by C-G formula based on Cr of 0.85).   Medical History: Past Medical History  Diagnosis Date  . Chronic pain     DJD knees, back, migranes, LLE varicose veins, and LLE neuropathy.   . Aortic stenosis     Dx ECHO 2008 , mild and aymptomatic , needs ECHO q 3-5 yrs  . GERD (gastroesophageal reflux disease)     Barrett's esophagus demonstrated on EGD 12/2010 by Dr Arlyce Dice. Needs repeat EGD 12/2011  . Chronic insomnia   . Chronic anxiety     Admission to [facility] (90s or early 2000)  for 6 weeks after mother died. Complicated again by death of her sister 2010. 2012 developed hallucinations and I refused to refill controlled meds unless she see psych which she is not agreable o  . Elevated cholesterol 10/11    LDL 155. Her 10 year risk (decreasing her age to 40 as the calculator won't go to age 57) is 21% ish. If consider her non smoker (which I wouldn't even though she says 1 pak lasts 2 weeks) risk is 12% ish. So goal for LDL is 130 or 100.  Marland Kitchen HTN (hypertension)     Controlled with 2 drug therapy  . Migraine   . Cataract   . Depression   . Gastroparesis     Demonstrated on GES 12/2010 by Dr Dalene Seltzer  . Bronchitis     hx of         Assessment: 79 YOF admitted with abdominal pain x 3 days.  Found to have a new acute DVT in the left  soleal vein and superficial thrombophlebitis throughout the left posterior calf.  Pharmacy consulted to manage Lovenox and Coumadin therapy.  Renal function is appropriate to start Lovenox therapy based on labs from May (SCr 0.85).  Labs this admission are pending.  Expect INR to be close to one as patient is not on anticoagulation PTA and she does not have any liver dysfunction.   Goal of Therapy:  Anti-Xa level 0.6-1.2 units/ml 4hrs after LMWH dose given Monitor platelets by anticoagulation protocol: Yes INR 2 - 3    Plan:  - Coumadin 5mg  PO today - Lovenox 60mg  SQ Q12H - CBC Q72H while on Lovenox - Daily PT / INR - Coumadin book / video      D. Laney Potash, PharmD, BCPS Pager:  [phone] 09/28/2012, 2:11 PM

## 2012-09-28 NOTE — H&P (Signed)
Hospital Admission Note Date: 09/28/2012  Patient name: Sherri Mcmillan Medical record number: [id number] Date of birth: [birth year] Age: 79 y.o. Gender: female PCP: Dierdre Searles, NA, MD   Service:  Internal Medicine Teaching Service   Attending Physician:  Dr. Lars Mage    First Contact:   Dr. Earlene Plater Pager:  [phone] Second Contact:   Dr. Clyde Lundborg  Pager: [phone]     After 5PM, weekends, and holidays: First Contact:    Pager: [phone] Second Contact:    Pager: [phone]   Chief Complaint:  DVT    History of Present Illness:  This is a 79 year old woman with hypertension, GERD, Barrett's esophagus, chronic anxiety, depression, and chronic pain; who came to clinic today for hospital followup. She was recently evaluated in the hospital for chest pain and was found to have severe reflux esophagitis. She has been started on pantoprazole 40 mg daily, metoclopramide 5 mg 3 times a day, and Carafate 1 tablet 4 times a day. At her followup visit today, she was found to have a swollen left lower leg that was severely tender to the touch. The patient is unable to detail in the history about this swelling and pain. She cannot tell us when it started or how it has changed over time. She only says that she has a history of varicose veins. An ultrasound of the left lower leg demonstrated a DVT in the left soleal vein. She denies dyspnea and chest pain today.     Review of Systems:   Constitutional: Negative for fever and chills.  HENT: Negative for ear pain and tinnitus.   Eyes: Negative for pain and discharge.  Respiratory: Negative for cough.   Cardiovascular: Negative for chest pain.  Gastrointestinal: Negative for nausea, vomiting, abdominal pain, diarrhea, constipation and blood in stool.  Genitourinary: Negative for dysuria and hematuria.  Musculoskeletal: Positive for back pain. Negative for joint pain.  Skin: Negative for rash.  Neurological: Negative for dizziness.  Endo/Heme/Allergies: Negative for  cold intolerance and heat intolerance.      Medical History: Past Medical History  Diagnosis Date  . Chronic pain     DJD knees, back, migranes, LLE varicose veins, and LLE neuropathy.   . Aortic stenosis     Dx ECHO 2008 , mild and aymptomatic , needs ECHO q 3-5 yrs  . Barrett's esophagus     Demonstrated on EGD 12/2010. EGD 09/17/2012 shows inflamed GE junctional mucosa without metaplasia, dysplasia, or malignancy.  . Chronic insomnia   . Chronic anxiety     Admission to [facility] (90s or early 2000)  for 6 weeks after mother died. Complicated again by death of her sister 2010. 2012 developed hallucinations and I refused to refill controlled meds unless she see psych which she is not agreable to  . Elevated cholesterol 10/11    LDL 155. Her 10 year risk (decreasing her age to 64 as the calculator won't go to age 60) is 21% ish. If consider her non smoker (which I wouldn't even though she says 1 pak lasts 2 weeks) risk is 12% ish. So goal for LDL is 130 or 100.  Marland Kitchen HTN (hypertension)     Controlled with 2 drug therapy  . Migraine   . Cataract   . Depression   . Gastroparesis     Demonstrated on GES 12/2010 by Dr Dalene Seltzer  . Bronchitis     hx of  . Shortness of breath     Surgical History: Past Surgical History  Procedure Laterality  Date  . Direct laryngoscopy   September 2008     preoperative diagnosis hoarseness with anterior right vocal cord lesion -  direct laryngoscopy and excisional biopsy of right anterior vocal cord lesion done by Dr. Ezzard Standing  . Meniscectomy   July 2002     preoperative diagnosis torn medial meniscus right knee, partial medial meniscectomy, debridement chondroplasty patellofemoral joint, done by Dr. Madelon Lips  . Abdominal hysterectomy    . Back surgery    . Hemorrhoid surgery    . Appendectomy    . Cataract extraction      left eye  . Polypectomy  2000    Dr.Magod  . Colonoscopy  2000&2005    Dr.Magod  . Esophagogastroduodenoscopy  11/2010  . Knee arthroscopy     . Eye surgery    . Rotator cuff repair  09/21/2011    rt shoulder  . Esophagogastroduodenoscopy N/A 09/17/2012    Procedure: ESOPHAGOGASTRODUODENOSCOPY (EGD);  Surgeon: Hart Carwin, MD;  Location: [facility] ENDOSCOPY;  Service: Endoscopy;  Laterality: N/A;    Home Medications: 1. Amlodipine 10 mg daily 2. Furosemide 60 mg daily 3. Hydrocodone-acetaminophen 5-325, one-half to one tablet every 4 hours when necessary for pain 4. Metoclopramide 5 mg 3 times a day before meals 5. Pantoprazole 40 mg daily 6. Promethazine 12.5 mg by mouth every 6 hours when necessary for nausea 7. Sucralfate 1 tablet (1 Gram) 4 times a day 8. Tramadol 50 mg every 6 hours when necessary for pain   Allergies: Allergies as of 09/28/2012 - Review Complete 09/28/2012  Allergen Reaction Noted  . Milk-related compounds  09/16/2012  . Aspirin Rash 07/06/2006    Family History: Family History  Problem Relation Age of Onset  . Stroke Neg Hx   . Cancer Neg Hx   . Colon cancer Neg Hx   . Anesthesia problems Neg Hx   . Hypotension Neg Hx   . Malignant hyperthermia Neg Hx   . Pseudochol deficiency Neg Hx   . Heart disease Mother   . Hypertension Mother     Social History: Social History  . Marital Status: Single    Spouse Name: N/A    Number of Children: N/A  . Years of Education: N/A   Social History Main Topics  . Smoking status: Former Smoker    Types: Cigarettes    Quit date: 06/01/2010  . Smokeless tobacco: Never Used  . Alcohol Use: No  . Drug Use: No  . 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   Sings with church and has several CDs   Her sister died on January 16, 2010 and she is in the grieving process and trying to comfort her sisters kids   Smokes one pack every 2 weeks     Physical exam:  VITALS: BP 130/75, HR 65, RR 18, temp 27F, SpO2  100% on room air GENERAL: well developed, well nourished; no acute distress HEAD: atraumatic, normocephalic EYES: pupils equal, round and reactive; sclera anicteric; normal conjunctiva EARS: canals patent and TMs normal bilaterally NOSE/THROAT: oropharynx clear, moist mucous membranes, pink gums, dentures in place NECK: supple, no carotid bruits, thyroid normal in size and without palpable nodules LYMPH: no cervical or supraclavicular lymphadenopathy LUNGS: clear to auscultation bilaterally, normal work of breathing HEART: normal rate and regular rhythm; normal S1 and S2 without S3  or S4; 2/6 crescendo-decrescendo systolic murmur heard best at the upper sternal borders PULSES: radial and dorsalis pedis pulses are 2+ and symmetric ABDOMEN: soft, non-tender, normal bowel sounds, no masses or organomegaly SKIN: warm, dry, intact, normal turgor, no rashes EXTREMITIES: exquisite tenderness with palpation of the left calf muscle and dorsiflexion of the left foot, no palpable cords, no tenderness in the right leg PSYCH: patient is alert and oriented, mood and affect are normal and congruent     Lab results: Basic Metabolic Panel:  98/11/91 1520  NA 139  K 3.8  CL 102  CO2 29  GLUCOSE 92  BUN 17  CREATININE 0.87  CALCIUM 9.6    CBC:  09/28/12 1520  WBC 4.9  HGB 11.3*  HCT 33.9*  MCV 87.4  PLT 212    Coagulation:  09/28/12 1520  LABPROT 12.9  INR 0.98    Imaging results: Left lower extremity venous duplex study 09/28/2012 SUMMARY: 1. Acute deep vein thrombosis involving the soleal vein of the left lower extremity. 2. Superficial thrombophlebitis noted in multiple varicosities of the left posterior calf. 3. No evidence of Baker's cyst on the left.     Assessment and Plan:  1.   Left lower extremity DVT:  Patient's psychiatric history makes it unlikely she would be able to manage initial anticoagulation at home. We will start anticoagulation with warfarin and  enoxaparin. - Enoxaparin dosing per pharmacy, currently 60 mg twice a day - Warfarin dosing per pharmacy, currently 5 mg tonight  2.   Hypertension:  At home, she was taking amlodipine 10 mg daily and furosemide 60 mg daily. We will continue both of these. - Continue amlodipine 10 mg daily - Continue furosemide 60 mg daily  3.   GERD and Barrett's esophagus:  At home, she was taking metoclopramide 5 mg 3 times a day, pantoprazole 40 mg daily, sucralfate 1 g 4 times a day, and promethazine 12.5 mg every 6 hours when necessary for nausea. We will continue all of this. Because there is some concern over a gastric or esophageal ulcer, we will repeat a hemoglobin in the morning to insure anticoagulation does not cause this to bleed. - Continue metoclopramide 5 mg 3 times a day before meals - Continue pantoprazole 40 mg daily - Continue promethazine PO 12.5 mg every 6 hours when necessary for nausea - Continue sucralfate 1 g 3 times a day - AM H&H  4.   Chronic pain:  At home, she was taking tramadol 50 mg every 6 hours when necessary and hydrocodone-acetaminophen 5-325 every 6 hours.  - Continue hydrocodone-acetaminophen 5 history 25 mg every 6 hour when necessary for pain   5.   Chronic, normocytic anemia:  Stable. Baseline hemoglobin in the range of 11 to 12. Hemoglobin is 11.3 today.  6.   Prophylaxis: - Senna-docusate BID for bowel regimen - Therapeutic enoxaparin for VTE  7.   Disposition:  OPC patient.  Will need follow up with [place]. PT/OT evaluation ordered.    Signed by:  Dorthula Rue. Earlene Plater, MD PGY-I, Internal Medicine  09/28/2012, 4:41 PM

## 2012-09-28 NOTE — Assessment & Plan Note (Signed)
EGD in May 2014 "Severe distal reflux esophagitis with a biopsy showing "inflamed a gastroesophageal junctional mucosa with surface fibrinous material, suggestive of ulcer. There is no evidence of goblet cell metaplasia, dysplasia or malignancy."    - on Protonix, Reglan and Carafate - will follow up with GI DR. Kaplan at 915am on 10/02/12.

## 2012-09-28 NOTE — Assessment & Plan Note (Signed)
Pending GI study for recent hospitalization by [facility].  - She is on Reglan 5 mg 3 times a day - She has a followup appointment with Dr. Arlyce Dice at 9:15 AM on 10/02/2012.

## 2012-09-28 NOTE — Assessment & Plan Note (Addendum)
Patient reports chronic bilateral leg edema treated with Lasix.  Physical examination reviews left lower extremity swelling and pitting edema up to midcalf with her tenderness to palpation.  Homans sign is positive. No edema noted on her right lower extremity.  - Patient states that left LLE edema and pain have been chronic.  She's not sure whether it worsened recently or not  - Per chart review, she has never had LE Dopplers done.  Given her recent hospitalization,  I think we should rule out DVT.  Will sent her for stat LLE doppler to r/o DVT.   Addendum She is positive for LLE DVT. Given her age, and history of multiple psychiatric disease, I think that she will need to be admitted to hospital for bridging and coumadin treatment.

## 2012-09-28 NOTE — Progress Notes (Signed)
INTERNAL MEDICINE TEACHING ATTENDING ADDENDUM: I discussed this case with Dr. Dierdre Searles at the time of patient visit. I agree with her HPI, and exam findings. have read the documentation and I agree with the plan of care.

## 2012-09-28 NOTE — Progress Notes (Signed)
Pt taken to room 6N 20 via w/c after report was given to nurse. 1:30PM.  Stanton Kidney  RN 09/28/12.

## 2012-09-28 NOTE — Patient Instructions (Addendum)
1. Will schedule a GI follow up  2. Follow up with me in one month.  3. Will perform LLE doppler today.   Diet for Gastroesophageal Reflux Disease, Adult Reflux is when stomach acid flows up into the esophagus. The esophagus becomes irritated and sore (inflammation). When reflux happens often and is severe, it is called gastroesophageal reflux disease (GERD). What you eat can help ease any discomfort caused by GERD. FOODS OR DRINKS TO AVOID OR LIMIT  Coffee and black tea, with or without caffeine.  Bubbly (carbonated) drinks with caffeine or energy drinks.  Strong spices, such as pepper, cayenne pepper, curry, or chili powder.  Peppermint or spearmint.  Chocolate.  High-fat foods, such as meats, fried food, oils, butter, or nuts.  Fruits and vegetables that cause discomfort. This includes citrus fruits and tomatoes.  Alcohol. If a certain food or drink irritates your GERD, avoid eating or drinking it. THINGS THAT MAY HELP GERD INCLUDE:  Eat meals slowly.  Eat 5 to 6 small meals a day, not 3 large meals.  Do not eat food for a certain amount of time if it causes discomfort.  Wait 3 hours after eating before lying down.  Keep the head of your bed raised 6 to 9 inches (15 23 centimeters). Put a foam wedge or blocks under the legs of the bed.  Stay active. Weight loss, if needed, may help ease your discomfort.  Wear loose-fitting clothing.  Do not smoke or chew tobacco. Document Released: 10/12/2011 Document Reviewed: 10/12/2011 [facility] Patient Information 2014 [place], [place].

## 2012-09-29 DIAGNOSIS — I10 Essential (primary) hypertension: Secondary | ICD-10-CM

## 2012-09-29 DIAGNOSIS — M79609 Pain in unspecified limb: Secondary | ICD-10-CM

## 2012-09-29 DIAGNOSIS — I82409 Acute embolism and thrombosis of unspecified deep veins of unspecified lower extremity: Principal | ICD-10-CM

## 2012-09-29 LAB — PROTIME-INR: INR: 1.01 (ref 0.00–1.49)

## 2012-09-29 MED ORDER — RIVAROXABAN 15 MG PO TABS: 15.0000 mg | ORAL_TABLET | Freq: Two times a day (BID) | ORAL | Status: DC

## 2012-09-29 MED ORDER — RIVAROXABAN 20 MG PO TABS: 20.0000 mg | ORAL_TABLET | Freq: Every day | ORAL | Status: DC

## 2012-09-29 MED ORDER — HYDROCODONE-ACETAMINOPHEN 5-325 MG PO TABS
1.0000 | ORAL_TABLET | ORAL | Status: DC | PRN
  Administered 2012-09-29 – 2012-10-02 (×9): 2 via ORAL
  Administered 2012-10-02: 1 via ORAL
  Administered 2012-10-02 (×2): 2 via ORAL
  Administered 2012-10-03: 1 via ORAL
  Administered 2012-10-03: 2 via ORAL
  Filled 2012-09-29 (×14): qty 2

## 2012-09-29 MED ORDER — WARFARIN SODIUM 5 MG PO TABS
5.0000 mg | ORAL_TABLET | Freq: Once | ORAL | Status: DC
  Filled 2012-09-29: qty 1

## 2012-09-29 MED ORDER — MORPHINE SULFATE 2 MG/ML IJ SOLN
2.0000 mg | INTRAMUSCULAR | Status: AC
  Administered 2012-09-29: 2 mg via INTRAVENOUS
  Filled 2012-09-29: qty 1

## 2012-09-29 MED ORDER — MORPHINE SULFATE 2 MG/ML IJ SOLN
1.0000 mg | INTRAMUSCULAR | Status: DC | PRN
  Administered 2012-09-29 (×3): 2 mg via INTRAVENOUS
  Filled 2012-09-29 (×2): qty 1

## 2012-09-29 MED ORDER — RIVAROXABAN 15 MG PO TABS
15.0000 mg | ORAL_TABLET | Freq: Two times a day (BID) | ORAL | Status: DC
  Administered 2012-09-30: 15 mg via ORAL
  Filled 2012-09-29 (×3): qty 1

## 2012-09-29 NOTE — Consult Note (Signed)
ANTICOAGULATION CONSULT NOTE - Follow Up Consult  Pharmacy Consult for : Xarelto Indication:  DVT  Dosing Weight: 60.4 kg  Labs:  Recent Labs  09/28/12 1520 09/29/12 0500  HGB 11.3* 11.3*  HCT 33.9* 34.3*  PLT 212  --   LABPROT 12.9 13.2  INR 0.98 1.01  CREATININE 0.87  --    Lab Results  Component Value Date   INR 1.01 09/29/2012   INR 0.98 09/28/2012   INR 1.1 12/16/2006   Medications:  Scheduled:  . amLODipine  10 mg Oral Daily  . furosemide  60 mg Oral Daily  . metoCLOPramide  5 mg Oral TID AC  . pantoprazole  40 mg Oral Daily  . senna-docusate  1 tablet Oral BID  . sodium chloride  3 mL Intravenous Q12H  . sucralfate  1 g Oral TID AC & HS   Assessment:  78yo female with (+)DVT, on Lovenox and Warfarin who is felt to be unable to self-administer Lovenox as outpt.   INR 1.01 this AM. Hg is stable. No bleeding problems noted.  Xarelto per pharmacy protocol to begin at appropriate time.  Lovenox and Coumadin have been discontinued   Last full dose of Lovenox given ~ 1600 pm today.  Coumadin today not given.  CrCl 44.3 ml/min.  No hepatic issues noted.  Goal of Therapy:  Full Anticoagulation for + DVT   Plan:  Begin Xarelto 15 mg po BID x 21 days, then change to 20 mg once daily with food for remaining treatment duration.   , Deetta Perla.D 09/29/2012, 4:59 PM

## 2012-09-29 NOTE — Progress Notes (Signed)
   Subjective:    Interval Events:  Called to pt room for acute onset cp:  Onset: after arriving back in room from walking halls. Q: "squeezing". L: left chest. R: no radiation. S: 7/10. Aggreavated by palpation.  Not aggravated by breathing.    Objective:    Vital Signs:   BP 146/69 HR 66 SpO2 97% on room air   Physical Exam: LUNGS: clear to auscultation bilaterally, normal work of breathing HEART: normal rate and regular rhythm; normal S1 and S2 without S3 or S4; 2/6 crescendo-decrescendo systolic murmur heard best at the upper sternal borders CHEST: tender with palpation of left, anterior chest wall, reproducing pain    Other results: EKG Results:  09/29/2012 Rate:  68 PR:  140 QRS:  80 QTc:  425 Axis: Normal EKG: normal EKG, normal sinus rhythm, unchanged from previous tracings.     Assessment/ Plan:    1.  Chest pain:  Most likely MSK with reproducibility on exam.  EKG normal.  Not pleuritic.  Already on anticoagulation. - 2 mg IV morphine ordered for analgesia   Patient was feeling better when I saw her later.  Pain has not returned after morphine administered.    Length of Stay: 1 days   Signed by:  Dorthula Rue. Earlene Plater, MD PGY-I, Internal Medicine Pager [phone]  09/29/2012, 4:32 PM

## 2012-09-29 NOTE — Progress Notes (Signed)
Subjective:    Interval Events:  Patient feels well.  Denies dyspnea and dysuria.  Mild abd pain, unchanged from yesterday.  Calf tenderness remains the same.  We discussed warfarin+enoxaparin vs. Xarelto.  Pt interested in Xarelto and thinks she can afford in future months, but she cannot afford the $5 copay for the first rx needed at discharge.  Family/friends may be able to help, but not until Sunday.    Objective:    Vital Signs:   Temp:  [98 F-98.4 F] 98.4 F (36.9 C) (06/06 0447) Pulse Rate:  [65-70] 69 (06/06 0447) Resp:  [18] 18 (06/06 0447) BP: (127-135)/(67-76) 127/76 mmHg (06/06 0447) SpO2:  [100 %] 100 % (06/06 0447)   Weights: 24-hour Weight change:  - 1.8 kg  Filed Weights   09/28/12 1350 09/29/12 0447  Weight: 137 lb 2 oz (62.2 kg) 133 lb 2.5 oz (60.4 kg)    Intake/Output:   Gross per 24 hour  Intake    448 ml  Output      0 ml  Net    448 ml     Last BM Date: 09/27/12   Physical Exam: GENERAL: alert and oriented; resting comfortably in bed and in no distress LUNGS: clear to auscultation bilaterally, normal work of breathing HEART: normal rate; regular rhythm ABDOMEN: soft, mild tenderness w/ palp of epigastrium and upper quadrants, normal bowel sounds, no masses palpated EXTREMITIES: exquisite tenderness with palpation of the left calf muscle and dorsiflexion of the left foot, no palpable cords, no tenderness in the right leg SKIN: normal turgor    Labs: CBC: Lab 09/28/12 1520 09/29/12 0500  WBC 4.9  --   HGB 11.3* 11.3*  HCT 33.9* 34.3*  MCV 87.4  --   PLT 212  --     Coagulation Studies:  09/28/12 1520 09/29/12 0500  LABPROT 12.9 13.2  INR 0.98 1.01    Imaging: Left lower extremity venous duplex study  09/28/2012  SUMMARY: Findings consistent with acute deep vein thrombosis involving the soleal vein of the left lower extremity. There is also superficial thrombophlebitis noted in multiple varicosities of the left posterior  calf. No evidence of Baker's cyst on the left.     Medications:    Infusions:     Scheduled Medications: . amLODipine  10 mg Oral Daily  . enoxaparin (LOVENOX) injection  60 mg Subcutaneous Q12H  . furosemide  60 mg Oral Daily  . metoCLOPramide  5 mg Oral TID AC  . pantoprazole  40 mg Oral Daily  . senna-docusate  1 tablet Oral BID  . sodium chloride  3 mL Intravenous Q12H  . sucralfate  1 g Oral TID AC & HS  . warfarin   Does not apply Once  . Warfarin - Pharmacist Dosing Inpatient   Does not apply q1800     PRN Medications: sodium chloride, morphine injection, promethazine, sodium chloride    Assessment/ Plan:    1.   Left lower extremity DVT:  Patient's psychiatric history makes it unlikely she would be able to manage initial anticoagulation at home with enoxaparin.  Rivaroxaban would be an excellent choice, but finances limit this currently. We will continue anticoagulation with warfarin and enoxaparin until patient can either afford rivaroxaban or has support at home for enoxaparin injections. - Enoxaparin dosing per pharmacy, currently 60 mg twice a day  - Warfarin dosing per pharmacy  2.   Hypertension : At home, she was taking amlodipine 10 mg daily and furosemide  60 mg daily. We will continue both of these.  BP well controlled. - Continue amlodipine 10 mg daily  - Continue furosemide 60 mg daily   3.   GERD and Barrett's esophagus:  At home, she was taking metoclopramide 5 mg 3 times a day, pantoprazole 40 mg daily, sucralfate 1 g 4 times a day, and promethazine 12.5 mg every 6 hours when necessary for nausea. We will continue all of this. Hgb stable.  - Continue metoclopramide 5 mg 3 times a day before meals  - Continue pantoprazole 40 mg daily  - Continue promethazine PO 12.5 mg every 6 hours when necessary for nausea  - Continue sucralfate 1 g 3 times a day   4.   Chronic pain:  At home, she was taking tramadol 50 mg every 6 hours when necessary and  hydrocodone-acetaminophen 5-325 every 6 hours.  Advanced to morphine overnight.  We will go back to oral analgesia today. - Switch to hydrocodone-acetaminophen 5-325 mg, 1-2 tabs, every 4 hour when necessary for pain   5.   Chronic, normocytic anemia:  Stable. Baseline hemoglobin in the range of 11 to 12. Hemoglobin is 11.3 today.   6.   Prophylaxis:  - Senna-docusate BID for bowel regimen  - Therapeutic enoxaparin for VTE   7.   Disposition: OPC patient. Will need follow up with [place]. PT/OT evaluation ordered.    Length of Stay: 1 days   Signed by:  Sherri Mcmillan. Earlene Plater, MD PGY-I, Internal Medicine Pager [phone]  09/29/2012, 10:43 AM

## 2012-09-29 NOTE — Evaluation (Signed)
Occupational Therapy Evaluation Patient Details Name: Sherri Mcmillan MRN: [id number] DOB: 08/28/1932 Today's Date: 09/29/2012 Time: 9528-4132 OT Time Calculation (min): 31 min  OT Assessment / Plan / Recommendation Clinical Impression   This 79 y.o. Female admitted with h/o GERD, Barrett's esophagus, admitted with chest pain and heartburn.  Pt also found to have edematous Lt. Calf with + Homan's sign.  Doppler revealed Lt. Vein DVT.  Pt is on Warafin and Enoxaparin.  Pt reports a h/o of balance deficits that started about 1 month ago, but she denies falls.  She also endorses tingling in bil. Feet.  Pt. Initially required supervision for LB ADLs and functional transfers, but progressed to min guard assist due to LOB.  Pt ambulated in hallway, and upon return to room, pt with complaint of Lt. Chest pain 7/10.  Vitals stable.  RN and MD aware.  Pt will benefit from OT to maximize safety and independence with BADLs to allow her to return home at supervision to modified independent level.  She reports she will have assistance from friends upon discharge.  May benefit from follow up OPPT to address balance deficits     OT Assessment  Patient needs continued OT Services    Follow Up Recommendations  No OT follow up;Supervision/Assistance - 24 hour - Recommend OPPT for balance deficits   Barriers to Discharge None    Equipment Recommendations  None recommended by OT    Recommendations for Other Services    Frequency  Min 2X/week    Precautions / Restrictions Precautions Precautions: Fall Restrictions Weight Bearing Restrictions: No       ADL  Eating/Feeding: Independent Where Assessed - Eating/Feeding: Bed level;Chair Grooming: Wash/dry hands;Wash/dry face;Teeth care;Denture care;Min guard Where Assessed - Grooming: Unsupported standing Upper Body Bathing: Set up Where Assessed - Upper Body Bathing: Supported sitting;Unsupported sitting Lower Body Bathing: Min guard Where Assessed - Lower  Body Bathing: Unsupported sit to stand Upper Body Dressing: Set up Where Assessed - Upper Body Dressing: Unsupported sitting Lower Body Dressing: Min guard Where Assessed - Lower Body Dressing: Unsupported sit to stand Toilet Transfer: Min Pension scheme manager Method: Sit to stand;Stand pivot Acupuncturist: Comfort height toilet Toileting - Architect and Hygiene: Min guard Where Assessed - Engineer, mining and Hygiene: Standing Tub/Shower Transfer: Min Buyer, retail Method: Science writer: Shower seat with back Transfers/Ambulation Related to ADLs: Pt initially required min guard assist, but pt with LOB requiring min guard assist.  Pt reports she has been experiencing random LOB for the last month.  She denies falls, but reports close calls.   ADL Comments: Pt able to simulate shower in standing.  Discussed options for tub seat at home as it is recomended she sit to shower due to calf pain, and impaired balance.  She verbalized understanding, but will likely need reinforcement    OT Diagnosis: Generalized weakness;Acute pain  OT Problem List: Decreased strength;Decreased activity tolerance;Impaired balance (sitting and/or standing);Decreased knowledge of use of DME or AE;Pain OT Treatment Interventions: Self-care/ADL training;DME and/or AE instruction;Patient/family education;Balance training   OT Goals Acute Rehab OT Goals OT Goal Formulation: With patient Time For Goal Achievement: 10/06/12 Potential to Achieve Goals: Good ADL Goals Pt Will Perform Grooming: with modified independence;Standing at sink ADL Goal: Grooming - Progress: Goal set today Pt Will Perform Lower Body Bathing: with modified independence;Sit to stand in shower;Sit to stand from chair ADL Goal: Lower Body Bathing - Progress: Goal set today Pt Will Perform Lower Body  Dressing: with modified independence;Sit to stand from  chair;Sit to stand from bed ADL Goal: Lower Body Dressing - Progress: Goal set today Pt Will Transfer to Toilet: with modified independence;Ambulation;Comfort height toilet ADL Goal: Toilet Transfer - Progress: Goal set today Pt Will Perform Toileting - Clothing Manipulation: with modified independence;Standing ADL Goal: Toileting - Clothing Manipulation - Progress: Goal set today Pt Will Perform Tub/Shower Transfer: with modified independence;Ambulation;Shower seat with back ADL Goal: Tub/Shower Transfer - Progress: Goal set today  Visit Information  Last OT Received On: 09/29/12 Assistance Needed: +1    Subjective Data  Subjective: "Oh, it hurts!"  re: chest Patient Stated Goal: To get better   Prior Functioning     Home Living Lives With: Alone Available Help at Discharge: Family;Available 24 hours/day Type of Home: Apartment Home Access: Level entry Home Layout: One level Bathroom Shower/Tub: Tub/shower unit;Curtain Firefighter: Standard Home Adaptive Equipment: None Additional Comments: Pt will stay with friends initially after discharge Prior Function Level of Independence: Independent Able to Take Stairs?: Yes Driving: Yes Vocation: Retired Musician: No difficulties Dominant Hand: Right         Vision/Perception Vision - History Patient Visual Report: No change from baseline   Cognition  Cognition Behavior During Therapy: Anxious Overall Cognitive Status: Within Functional Limits for tasks assessed    Extremity/Trunk Assessment Right Upper Extremity Assessment RUE ROM/Strength/Tone: Within functional levels RUE Coordination: WFL - gross/fine motor Left Upper Extremity Assessment LUE ROM/Strength/Tone: Within functional levels LUE Coordination: WFL - gross/fine motor Trunk Assessment Trunk Assessment: Normal     Mobility Bed Mobility Bed Mobility: Supine to Sit;Sitting - Scoot to Edge of Bed Supine to Sit: 7:  Independent Sitting - Scoot to Edge of Bed: 7: Independent Transfers Transfers: Sit to Stand;Stand to Sit Sit to Stand: 5: Supervision;From bed Stand to Sit: 5: Supervision;To chair/3-in-1     Exercise     Balance     End of Session OT - End of Session Activity Tolerance: Patient tolerated treatment well Patient left: in chair;with call bell/phone within reach;with nursing in room;Other (comment) (MD in room) Nurse Communication: Other (comment) (chest pain and vitals)  GO     ,  M 09/29/2012, 11:35 AM

## 2012-09-29 NOTE — Evaluation (Addendum)
Physical Therapy Evaluation Patient Details Name: Sherri Mcmillan MRN: [id number] DOB: 09-17-1932 Today's Date: 09/29/2012 Time: 6295-2841 PT Time Calculation (min): 26 min  PT Assessment / Plan / Recommendation Clinical Impression  This 79 y.o. Female admitted with h/o GERD, Barrett's esophagus, admitted with chest pain and heartburn.  Pt also found to have edematous Lt. Calf with + Homan's sign.  Doppler revealed Lt. Vein DVT.  Pt is on Warafin and Enoxaparin.  Pt reports a h/o of balance deficits that started about 1 month ago, but she denies falls.  She also endorses tingling in bil. feet.  Pm eva, pt showed deconditioning with some functional weakness and continued pain from esphagitis and the DVT/  Pt can benefit from PT to maximize Independence.    PT Assessment  Patient needs continued PT services    Follow Up Recommendations  No PT follow up;Supervision for mobility/OOB    Does the patient have the potential to tolerate intense rehabilitation      Barriers to Discharge Decreased caregiver support (friend to come home 6/8 per pt.)      Equipment Recommendations  None recommended by PT    Recommendations for Other Services     Frequency Min 3X/week    Precautions / Restrictions Precautions Precautions: Fall Restrictions Weight Bearing Restrictions: No   Pertinent Vitals/Pain 7/10 L LE      Mobility  Bed Mobility Bed Mobility: Sitting - Scoot to Edge of Bed;Sit to Supine Sitting - Scoot to Edge of Bed: 7: Independent Sit to Supine: 7: Independent Transfers Transfers: Sit to Stand;Stand to Sit Sit to Stand: From bed;6: Modified independent (Device/Increase time) Stand to Sit: To chair/3-in-1;6: Modified independent (Device/Increase time) Ambulation/Gait Ambulation/Gait Assistance: 4: Min guard;5: Supervision Ambulation Distance (Feet): 170 Feet Assistive device: Other (Comment) (pushing Iv pole) Ambulation/Gait Assistance Details: mildly unsteady and antalgic, but  safe with something to hold Gait Pattern: Step-through pattern Stairs: Yes Stairs Assistance: 4: Min guard Stair Management Technique: One rail Right;Forwards Number of Stairs: 5 Wheelchair Mobility Wheelchair Mobility: No    Exercises     PT Diagnosis: Generalized weakness;Acute pain  PT Problem List: Decreased strength;Decreased activity tolerance;Decreased mobility;Decreased balance;Decreased knowledge of use of DME;Pain PT Treatment Interventions: DME instruction;Gait training;Functional mobility training;Therapeutic activities;Patient/family education   PT Goals Acute Rehab PT Goals PT Goal Formulation: With patient Time For Goal Achievement: 10/06/12 Potential to Achieve Goals: Good Pt will Transfer Bed to Chair/Chair to Bed: Independently PT Transfer Goal: Bed to Chair/Chair to Bed - Progress: Goal set today Pt will Ambulate: >150 feet;with modified independence;with least restrictive assistive device PT Goal: Ambulate - Progress: Goal set today  Visit Information  Last PT Received On: 09/29/12 Assistance Needed: +1    Subjective Data      Prior Functioning  Home Living Lives With: Alone Available Help at Discharge: Family;Available 24 hours/day Type of Home: Apartment Home Access: Level entry Home Layout: One level Bathroom Shower/Tub: Tub/shower unit;Curtain Firefighter: Standard Home Adaptive Equipment: None Additional Comments: Pt will stay with friends initially after discharge Prior Function Level of Independence: Independent Able to Take Stairs?: Yes Driving: Yes Vocation: Retired Musician: No difficulties Dominant Hand: Right    Copywriter, advertising Behavior During Therapy: Anxious Overall Cognitive Status: Within Functional Limits for tasks assessed    Extremity/Trunk Assessment Right Upper Extremity Assessment RUE ROM/Strength/Tone: Within functional levels RUE Coordination: WFL - gross/fine motor Left Upper Extremity  Assessment LUE ROM/Strength/Tone: Within functional levels LUE Coordination: WFL - gross/fine motor Right Lower Extremity  Assessment RLE ROM/Strength/Tone: Within functional levels (bil generally weak, but functional and painful LLE) Left Lower Extremity Assessment LLE ROM/Strength/Tone: Within functional levels Trunk Assessment Trunk Assessment: Normal   Balance Balance Balance Assessed: Yes Static Standing Balance Static Standing - Balance Support: Left upper extremity supported;During functional activity Static Standing - Level of Assistance: 5: Stand by assistance  End of Session PT - End of Session Activity Tolerance: Patient tolerated treatment well Patient left: in bed Nurse Communication: Mobility status  GP     , Eliseo Gum 09/29/2012, 3:09 PM 09/29/2012  [place] Bing, PT [phone]  (pager)

## 2012-09-29 NOTE — Progress Notes (Signed)
Help patient ambulate to bathroom with assistance; ambulates well.  Pleasant conversation. Noticed no signs of distress. Several family members and friends visiting.

## 2012-09-29 NOTE — Care Management Note (Signed)
  Page 2 of 2   10/02/2012     11:54:29 AM   CARE MANAGEMENT NOTE 10/02/2012  Patient:  Sherri Mcmillan, Sherri Mcmillan   Account Number:  [id number]  Date Initiated:  09/29/2012  Documentation initiated by:  Ronny Flurry  Subjective/Objective Assessment:   79 yo female amitted with left leg DVT.     Action/Plan:   Home when stable   Anticipated DC Date:  10/02/2012   Anticipated DC Plan:  HOME W HOME HEALTH SERVICES      DC Planning Services  CM consult      Choice offered to / List presented to:  C-1 Patient   DME arranged  Levan Hurst      DME agency  [facility]     [facility] arranged  HH-1 RN      [facility] agency  [facility]   Status of service:  In process, will continue to follow Medicare Important Message given?   (If response is "NO", the following Medicare IM given date fields will be blank) Date Medicare IM given:   Date Additional Medicare IM given:    Discharge Disposition:    Per UR Regulation:  Reviewed for med. necessity/level of care/duration of stay  If discussed at Long Length of Stay Meetings, dates discussed:    Comments:  Call INR results to [phone] To provide the following care/treatments->RN     10-02-12 Referral for [facility] for daily Lovenox injections .[facility] can do 4 daily visits , can also draw INR's just need to know when MD wants INR drawn and which MD to call results to. Dr Earlene Plater aware .  Patient discharging to : [address] , [place], phone [phone]  Patient requesting walker  Ronny Flurry RN BSN [phone]    09/30/12 1453 Leonie Green [phone] Cm notified by MD patient to discharge home on Coumadin instead of Xarelto prior to discharge due to the cost of the medication post free trial assistance care. PT/OT eval recommendations for no further PT/OT follow-up. Pt's family and friends to assist in home care. No other needs identified.   09-29-12 Consult for  Xarelto.  Confirmed with  Gildardo Pounds with  Xarelto , patient can use  Xarelto $10 free trail card even though she has [employer] .  Gildardo Pounds with  Xarelto  said the $10 free trail card is actually increased to 30 free day trail if dose is daily , if daose is BID then card will provide 21 day free trail . Patinet will need separate prescription for 30 days or 21 days . Explained to patient and Dr Earlene Plater .  Ronny Flurry RN BSN [phone]

## 2012-09-29 NOTE — Progress Notes (Deleted)
Patient discharged back to [facility] transported by ambulance, report given to Rudean Hitt LPN.

## 2012-09-29 NOTE — H&P (Signed)
Internal Medicine Attending Admission Note Date: 09/29/2012  Patient name: Sherri Mcmillan Medical record number: [id number] Date of birth: 20-May-1932 Age: 79 y.o. Gender: female  I saw and evaluated the patient. I reviewed the resident's note and I agree with the resident's findings and plan as documented in the resident's note.  Chief Complaint(s): Left Lower extremity pain  History - key components related to admission: Patient is a 78 year old woman with past medical history most significant for hypertension, reflux disease, chronic anxiety and depression and chronic pain syndrome who was seen in the clinic by Dr. Dierdre Searles yesterday. Patient was complaining of swelling and pain in her left lower extremity. Further evaluation by her doctor suggested that patient had a deep vein thrombosis and hence she was admitted for anticoagulation and pain control.  Patient complains of 10 out of 10 pain at this time along with swelling. No radiation noted, patient is able to move her left lower extremity, no fever or chills, no nausea, no vomiting, no abdominal pain noted. Patient denies any shortness of breath or chest pain.   15 point review of system is negative except what is noted above in the history of present illness.  Past medical history, past surgical history, medications, family history and social history was reviewed and is as per resident's note.    Physical Exam - key components related to admission:  Filed Vitals:   09/28/12 1350 09/28/12 2053 09/29/12 0447 09/29/12 1123  BP: 130/75 135/67 127/76 146/69  Pulse: 65 70 69 66  Temp: 98 F (36.7 C) 98 F (36.7 C) 98.4 F (36.9 C)   TempSrc: Oral Oral Oral   Resp: 18 18 18    Height: 5' 0.24" (1.53 m)     Weight: 137 lb 2 oz (62.2 kg)  133 lb 2.5 oz (60.4 kg)   SpO2: 100% 100% 100% 97%  Physical Exam: General: Vital signs reviewed and noted. Well-developed, well-nourished, in no acute distress; alert, appropriate and cooperative throughout  examination.  Head: Normocephalic, atraumatic.  Eyes: PERRL, EOMI, No signs of anemia or jaundince.  Nose: Mucous membranes moist, not inflammed, nonerythematous.  Throat: Oropharynx nonerythematous, no exudate appreciated.   Neck: No deformities, masses, or tenderness noted.Supple, No carotid Bruits, no JVD.  Lungs:  Normal respiratory effort. Clear to auscultation BL without crackles or wheezes.  Heart: RRR. S1 and S2 normal without gallop, murmur, or rubs.  Abdomen:  BS normoactive. Soft, Nondistended, non-tender.  No masses or organomegaly.  Extremities:  left cough looks about twice the size as compared to the right, tender to touch, no superficial skin changes noted, 2+ radial and dorsalis pedis pulses bilaterally   Neurologic: A&O X3, CN II - XII are grossly intact. Motor strength is 5/5 in the all 4 extremities, Sensations intact to light touch, Cerebellar signs negative.  Skin: No visible rashes, scars.     Lab results:   Basic Metabolic Panel:  Recent Labs  19/14/78 1520  NA 139  K 3.8  CL 102  CO2 29  GLUCOSE 92  BUN 17  CREATININE 0.87  CALCIUM 9.6   CBC:  Recent Labs  09/28/12 1520 09/29/12 0500  WBC 4.9  --   HGB 11.3* 11.3*  HCT 33.9* 34.3*  MCV 87.4  --   PLT 212  --    Coagulation:  Recent Labs  09/28/12 1520 09/29/12 0500  INR 0.98 1.01    Imaging results:  No results found.  Other results: EKG: No EKG on admission  Assessment &  Plan by Problem:  Principal Problem:   Left leg DVT Active Problems:   Chronic pain   HTN (hypertension)   GERD (gastroesophageal reflux disease)   Barrett's esophagus  Patient is a 79 year old female with past medical history most significant for reflux disease, hypertension, chronic pain syndrome and now with a new diagnosis of left lower extremity deepvein thrombosis. Given that patient has no contraindications I believe that Rivaroxaban would be a good drug to start. Patient is insured and she will be  able to afford $12 monthly copay which was confirmed with the pharmacy and the patient. She has also been given a card for 21 day free supply.  Patient to follow up in clinic next week for compliance.   Rest of the medical management as per resident's note.  Lars Mage MD Faculty-Internal Medicine Residency Program

## 2012-09-29 NOTE — Progress Notes (Signed)
ANTICOAGULATION CONSULT NOTE - Follow Up Consult  Pharmacy Consult for Coumadin Indication: DVT  Allergies  Allergen Reactions  . Milk-Related Compounds   . Aspirin Rash    Patient Measurements: Height: 5' 0.24" (153 cm) Weight: 133 lb 2.5 oz (60.4 kg) IBW/kg (Calculated) : 46.04 Heparin Dosing Weight:   Vital Signs: Temp: 98.4 F (36.9 C) (06/06 0447) Temp src: Oral (06/06 0447) BP: 127/76 mmHg (06/06 0447) Pulse Rate: 69 (06/06 0447)  Labs:  Recent Labs  09/28/12 1520 09/29/12 0500  HGB 11.3* 11.3*  HCT 33.9* 34.3*  PLT 212  --   LABPROT 12.9 13.2  INR 0.98 1.01  CREATININE 0.87  --     Estimated Creatinine Clearance: 44.3 ml/min (by C-G formula based on Cr of 0.87).   Medications:  Scheduled:  . amLODipine  10 mg Oral Daily  . enoxaparin (LOVENOX) injection  60 mg Subcutaneous Q12H  . furosemide  60 mg Oral Daily  . metoCLOPramide  5 mg Oral TID AC  . pantoprazole  40 mg Oral Daily  . senna-docusate  1 tablet Oral BID  . sodium chloride  3 mL Intravenous Q12H  . sucralfate  1 g Oral TID AC & HS  . warfarin   Does not apply Once  . Warfarin - Pharmacist Dosing Inpatient   Does not apply q1800    Assessment: 79yo female with (+)DVT, on day #2/5 minimum overlap with Lovenox and felt unable to self-administer Lovenox as outpt.  INR 1.01 this AM.  Hg is stable.  No bleeding problems noted.  Goal of Therapy:  INR 2-3 Monitor platelets by anticoagulation protocol: Yes   Plan:  1.  Coumadin 5mg  2.  Continue Lovenox 3.  F/U in AM  Marisue Humble, PharmD Clinical Pharmacist [facility]

## 2012-09-30 MED ORDER — WARFARIN SODIUM 7.5 MG PO TABS
7.5000 mg | ORAL_TABLET | Freq: Once | ORAL | Status: AC
  Administered 2012-09-30: 7.5 mg via ORAL
  Filled 2012-09-30: qty 1

## 2012-09-30 MED ORDER — ALUM & MAG HYDROXIDE-SIMETH 200-200-20 MG/5ML PO SUSP
15.0000 mL | Freq: Three times a day (TID) | ORAL | Status: DC | PRN
  Administered 2012-09-30 (×2): 15 mL via ORAL
  Filled 2012-09-30 (×2): qty 30

## 2012-09-30 MED ORDER — WARFARIN - PHARMACIST DOSING INPATIENT
Freq: Every day | Status: DC
  Administered 2012-10-01: 18:00:00

## 2012-09-30 MED ORDER — ENOXAPARIN SODIUM 60 MG/0.6ML ~~LOC~~ SOLN
60.0000 mg | Freq: Two times a day (BID) | SUBCUTANEOUS | Status: AC
  Administered 2012-09-30 – 2012-10-02 (×5): 60 mg via SUBCUTANEOUS
  Filled 2012-09-30 (×6): qty 0.6

## 2012-09-30 NOTE — Progress Notes (Addendum)
Subjective:    Interval Events:   - Patient feels well.  Denies dyspnea and dysuria.  Unchanged from yesterday.  Calf tenderness remains the same.  - She received one dose of Xarelto in AM. She reports that she discussed with her lady friend and her brother about the cost of Xarelto. She states that she will not be able to afford for [facility] after free trail. She made her decision to go back to Coumadin. Both her lady friend and her brother urged her to switch to Coumadin from now. I spoke with Case manager and was told that patient will need to pay $45/month with her current insurance.   -INR: 1.0. Hbg stable 11.3  Objective:    Vital Signs:   Temp:  [98 F-98.4 F] 98.4 F (36.9 C) (06/06 0447) Pulse Rate:  [65-70] 69 (06/06 0447) Resp:  [18] 18 (06/06 0447) BP: (127-135)/(67-76) 127/76 mmHg (06/06 0447) SpO2:  [100 %] 100 % (06/06 0447)   Weights: 24-hour Weight change: -1 lb 15.8 oz (-0.9 kg) - 1.8 kg  Filed Weights   09/28/12 1350 09/29/12 0447 09/30/12 0532  Weight: 137 lb 2 oz (62.2 kg) 133 lb 2.5 oz (60.4 kg) 135 lb 2.3 oz (61.3 kg)    Intake/Output:   Gross per 24 hour  Intake    448 ml  Output      0 ml  Net    448 ml     Last BM Date: 09/27/12   Physical Exam: GENERAL: alert and oriented; resting comfortably in bed and in no distress LUNGS: clear to auscultation bilaterally, normal work of breathing HEART: normal rate; regular rhythm ABDOMEN: soft, mild tenderness w/ palp of epigastrium and upper quadrants, normal bowel sounds, no masses palpated EXTREMITIES: exquisite tenderness with palpation of the left calf muscle and dorsiflexion of the left foot, no palpable cords, no tenderness in the right leg SKIN: normal turgor    Labs: CBC: Lab 09/28/12 1520 09/29/12 0500  WBC 4.9  --   HGB 11.3* 11.3*  HCT 33.9* 34.3*  MCV 87.4  --   PLT 212  --     Coagulation Studies:  09/28/12 1520 09/29/12 0500  LABPROT 12.9 13.2  INR 0.98 1.01     Imaging: Left lower extremity venous duplex study  09/28/2012  SUMMARY: Findings consistent with acute deep vein thrombosis involving the soleal vein of the left lower extremity. There is also superficial thrombophlebitis noted in multiple varicosities of the left posterior calf. No evidence of Baker's cyst on the left.     Medications:    Infusions:     Scheduled Medications: . amLODipine  10 mg Oral Daily  . furosemide  60 mg Oral Daily  . metoCLOPramide  5 mg Oral TID AC  . pantoprazole  40 mg Oral Daily  . Rivaroxaban  15 mg Oral Q12H   Followed by  . [START ON 10/21/2012] Rivaroxaban  20 mg Oral Q supper  . senna-docusate  1 tablet Oral BID  . sodium chloride  3 mL Intravenous Q12H  . sucralfate  1 g Oral TID AC & HS     PRN Medications: sodium chloride, alum & mag hydroxide-simeth, HYDROcodone-acetaminophen, promethazine, sodium chloride    Assessment/ Plan:    1.   Left lower extremity DVT:  Patient's psychiatric history makes it unlikely she would be able to manage initial anticoagulation at home with enoxaparin.  Rivaroxaban would be an excellent choice, but finances limit this. She initially believed that  she could afford for [facility] and agreed to switch to [place]. She received one dose of Xalreto in AM. After discussion with her friend and her brother, she made her decision to switch back to coumadin today due to the financial reason.   - will switch to Coumadin and Lovenox bridge - Enoxaparin dosing per pharmacy - Warfarin dosing per pharmacy  2.   Hypertension : At home, she was taking amlodipine 10 mg daily and furosemide 60 mg daily. We will continue both of these.  BP well controlled. - Continue amlodipine 10 mg daily  - Continue furosemide 60 mg daily   3.   GERD and Barrett's esophagus:  At home, she was taking metoclopramide 5 mg 3 times a day, pantoprazole 40 mg daily, sucralfate 1 g 4 times a day, and promethazine 12.5 mg every 6 hours when  necessary for nausea. We will continue all of this. Hgb stable.  - Continue metoclopramide 5 mg 3 times a day before meals  - Continue pantoprazole 40 mg daily  - Continue promethazine PO 12.5 mg every 6 hours when necessary for nausea  - Continue sucralfate 1 g 3 times a day   4.   Chronic pain:  At home, she was taking tramadol 50 mg every 6 hours when necessary and hydrocodone-acetaminophen 5-325 every 6 hours.  Advanced to morphine overnight.  We will go back to oral analgesia today. - Switch to hydrocodone-acetaminophen 5-325 mg, 1-2 tabs, every 4 hour when necessary for pain   5.   Chronic, normocytic anemia:  Stable. Baseline hemoglobin in the range of 11 to 12. Hemoglobin is 11.3 today.   6.   Prophylaxis:  - Senna-docusate BID for bowel regimen  - Therapeutic enoxaparin for VTE   7.   Disposition: OPC patient. Will need follow up with [place]. PT/OT evaluation ordered.    Length of Stay: 2 days   Signed by:  Lorretta Harp, MD PGY2, Internal Medicine Teaching Service Pager: [phone]   09/30/2012, 10:36 AM

## 2012-09-30 NOTE — Care Management Note (Signed)
Cm notified by MD patient to discharge home on Coumadin instead of Xarelto prior to discharge due to the cost of the medication post free trial assistance care. PT/OT eval recommendations for no further PT/OT follow-up. Pt's family and friends to assist in home care. No other needs identified.   Roxy Manns ,RN,BSN [phone]

## 2012-09-30 NOTE — Progress Notes (Signed)
ANTICOAGULATION CONSULT NOTE - Follow Up Consult  Pharmacy Consult for Lovenox, Coumadin Indication: DVT  Allergies  Allergen Reactions  . Milk-Related Compounds   . Aspirin Rash    Patient Measurements: Height: 5' 0.24" (153 cm) Weight: 135 lb 2.3 oz (61.3 kg) IBW/kg (Calculated) : 46.04 Heparin Dosing Weight:   Vital Signs: Temp: 97.8 F (36.6 C) (06/07 0532) Temp src: Oral (06/07 0532) BP: 120/72 mmHg (06/07 1034) Pulse Rate: 70 (06/07 0532)  Labs:  Recent Labs  09/28/12 1520 09/29/12 0500  HGB 11.3* 11.3*  HCT 33.9* 34.3*  PLT 212  --   LABPROT 12.9 13.2  INR 0.98 1.01  CREATININE 0.87  --     Estimated Creatinine Clearance: 44.5 ml/min (by C-G formula based on Cr of 0.87).   Medications:  Scheduled:  . amLODipine  10 mg Oral Daily  . furosemide  60 mg Oral Daily  . metoCLOPramide  5 mg Oral TID AC  . pantoprazole  40 mg Oral Daily  . Rivaroxaban  15 mg Oral Q12H   Followed by  . [START ON 10/21/2012] Rivaroxaban  20 mg Oral Q supper  . senna-docusate  1 tablet Oral BID  . sodium chloride  3 mL Intravenous Q12H  . sucralfate  1 g Oral TID AC & HS    Assessment: 79yo female changed to Xarelto for DVT, to change back to Lovenox and Coumadin as she cannot afford Xarelto.  She received a dose of Xarelto this AM.  Her INR had been d/c'd, but was 1.01 6/6 and no dose given last PM.  Goal of Therapy:  INR 2-3 Monitor platelets by anticoagulation protocol: Yes   Plan:  1.  D/C Xarelto 2.  Resume Lovenox 60mg  SQ q12, qst dose at 6pm 3.  Coumadin 7.5mg  at 6pm 4.  Daily INR 5.  CBC q72  Marisue Humble, PharmD Clinical Pharmacist [facility]

## 2012-10-01 LAB — CBC
HCT: 34.7 % — ABNORMAL LOW (ref 36.0–46.0)
Hemoglobin: 11.6 g/dL — ABNORMAL LOW (ref 12.0–15.0)
MCH: 29 pg (ref 26.0–34.0)
RBC: 4 MIL/uL (ref 3.87–5.11)

## 2012-10-01 LAB — PROTIME-INR: Prothrombin Time: 15 seconds (ref 11.6–15.2)

## 2012-10-01 MED ORDER — WARFARIN SODIUM 5 MG PO TABS
5.0000 mg | ORAL_TABLET | Freq: Once | ORAL | Status: AC
  Administered 2012-10-01: 5 mg via ORAL
  Filled 2012-10-01: qty 1

## 2012-10-01 NOTE — Progress Notes (Signed)
ANTICOAGULATION CONSULT NOTE - Follow Up Consult  Pharmacy Consult for Coumadin Indication: DVT  Allergies  Allergen Reactions  . Milk-Related Compounds   . Aspirin Rash    Patient Measurements: Height: 5' 0.24" (153 cm) Weight: 132 lb 7.9 oz (60.1 kg) IBW/kg (Calculated) : 46.04 Heparin Dosing Weight:   Vital Signs: Temp: 98.3 F (36.8 C) (06/08 1437) Temp src: Oral (06/08 1437) BP: 126/63 mmHg (06/08 1437) Pulse Rate: 73 (06/08 1437)  Labs:  Recent Labs  09/28/12 1520 09/29/12 0500 10/01/12 0551  HGB 11.3* 11.3* 11.6*  HCT 33.9* 34.3* 34.7*  PLT 212  --  212  LABPROT 12.9 13.2 15.0  INR 0.98 1.01 1.20  CREATININE 0.87  --   --     Estimated Creatinine Clearance: 44.1 ml/min (by C-G formula based on Cr of 0.87).   Medications:  Scheduled:  . amLODipine  10 mg Oral Daily  . enoxaparin (LOVENOX) injection  60 mg Subcutaneous Q12H  . furosemide  60 mg Oral Daily  . metoCLOPramide  5 mg Oral TID AC  . pantoprazole  40 mg Oral Daily  . senna-docusate  1 tablet Oral BID  . sodium chloride  3 mL Intravenous Q12H  . sucralfate  1 g Oral TID AC & HS  . Warfarin - Pharmacist Dosing Inpatient   Does not apply q1800    Assessment: 79yo female with new DVT, calf tenderness is improved today.  INR 1.2 this AM, but this may be reflective of Xarelto dose pt received on 6/7.  Hg and pltc are stable.  No bleeding problems noted.  Today is day#2/5 minimum overlap for Lovenox and Coumadin.  Goal of Therapy:  INR 2-3 Monitor platelets by anticoagulation protocol: Yes   Plan:  1.  Coumadin 5mg  today 2.  F/U in AM  Marisue Humble, PharmD Clinical Pharmacist [facility]

## 2012-10-01 NOTE — Progress Notes (Signed)
Subjective:    Interval Events:   - Patient feels well.  Denies dyspnea and dysuria.  Calf tenderness improved slightly.  - Switched back to Coumadin and lovenox on 6/7   -INR: 1.2. Hbg stable 11.6  Objective:    Vital Signs:   Temp:  [98 F-98.4 F] 98.4 F (36.9 C) (06/06 0447) Pulse Rate:  [65-70] 69 (06/06 0447) Resp:  [18] 18 (06/06 0447) BP: (127-135)/(67-76) 127/76 mmHg (06/06 0447) SpO2:  [100 %] 100 % (06/06 0447)   Weights: 24-hour Weight change: -2 lb 10.3 oz (-1.2 kg) - 1.8 kg  Filed Weights   09/29/12 0447 09/30/12 0532 10/01/12 0528  Weight: 133 lb 2.5 oz (60.4 kg) 135 lb 2.3 oz (61.3 kg) 132 lb 7.9 oz (60.1 kg)    Intake/Output:   Gross per 24 hour  Intake    448 ml  Output      0 ml  Net    448 ml     Last BM Date: 09/27/12   Physical Exam: GENERAL: alert and oriented; resting comfortably in bed and in no distress LUNGS: clear to auscultation bilaterally, normal work of breathing HEART: normal rate; regular rhythm ABDOMEN: soft, mild tenderness w/ palp of epigastrium and upper quadrants, normal bowel sounds, no masses palpated EXTREMITIES: exquisite tenderness with palpation of the left calf muscle and dorsiflexion of the left foot, no palpable cords, no tenderness in the right leg SKIN: normal turgor    Labs: CBC: Lab 09/28/12 1520 09/29/12 0500  WBC 4.9  --   HGB 11.3* 11.3*  HCT 33.9* 34.3*  MCV 87.4  --   PLT 212  --     Coagulation Studies:  09/28/12 1520 09/29/12 0500  LABPROT 12.9 13.2  INR 0.98 1.01    Imaging: Left lower extremity venous duplex study  09/28/2012  SUMMARY: Findings consistent with acute deep vein thrombosis involving the soleal vein of the left lower extremity. There is also superficial thrombophlebitis noted in multiple varicosities of the left posterior calf. No evidence of Baker's cyst on the left.     Medications:    Infusions:     Scheduled Medications: . amLODipine  10 mg Oral Daily  .  enoxaparin (LOVENOX) injection  60 mg Subcutaneous Q12H  . furosemide  60 mg Oral Daily  . metoCLOPramide  5 mg Oral TID AC  . pantoprazole  40 mg Oral Daily  . senna-docusate  1 tablet Oral BID  . sodium chloride  3 mL Intravenous Q12H  . sucralfate  1 g Oral TID AC & HS  . Warfarin - Pharmacist Dosing Inpatient   Does not apply q1800     PRN Medications: sodium chloride, alum & mag hydroxide-simeth, HYDROcodone-acetaminophen, promethazine, sodium chloride    Assessment/ Plan:    1.   Left lower extremity DVT:  Patient's psychiatric history makes it unlikely she would be able to manage initial anticoagulation at home with enoxaparin.  Rivaroxaban would be an excellent choice, but finances limit this. She initially believed that she could afford for [facility] and agreed to switch to [place]. She received one dose of Xalreto in AM. After discussion with her friend and her brother, she made her decision to switch back to coumadin today due to the financial reason.  INR=1.2  - Continue Coumadin and Lovenox bridge - Enoxaparin dosing per pharmacy - Warfarin dosing per pharmacy  2.   Hypertension : At home, she was taking amlodipine 10 mg daily and furosemide 60 mg daily.  We will continue both of these.  BP well controlled. - Continue amlodipine 10 mg daily  - Continue furosemide 60 mg daily   3.   GERD and Barrett's esophagus:  At home, she was taking metoclopramide 5 mg 3 times a day, pantoprazole 40 mg daily, sucralfate 1 g 4 times a day, and promethazine 12.5 mg every 6 hours when necessary for nausea. We will continue all of this. Hgb stable.  - Continue metoclopramide 5 mg 3 times a day before meals  - Continue pantoprazole 40 mg daily  - Continue promethazine PO 12.5 mg every 6 hours when necessary for nausea  - Continue sucralfate 1 g 3 times a day   4.   Chronic pain:  At home, she was taking tramadol 50 mg every 6 hours when necessary and hydrocodone-acetaminophen 5-325 every 6  hours.  Advanced to morphine overnight.  We will go back to oral analgesia today. - Switch to hydrocodone-acetaminophen 5-325 mg, 1-2 tabs, every 4 hour when necessary for pain   5.   Chronic, normocytic anemia:  Stable. Baseline hemoglobin in the range of 11 to 12. Hemoglobin is 11.3 today.   6.   Prophylaxis:  - Senna-docusate BID for bowel regimen  - Therapeutic enoxaparin for VTE   7.   Disposition: OPC patient. Will need follow up with [place]. Patient can not do the Lovenox injection at home. May need to be in hospital until INR therapeutic.    Length of Stay: 3 days   Signed by:  Lorretta Harp, MD PGY2, Internal Medicine Teaching Service Pager: [phone]   10/01/2012, 1:55 PM

## 2012-10-01 NOTE — Progress Notes (Signed)
Occupational Therapy Treatment Patient Details Name: Sherri Mcmillan MRN: [id number] DOB: 17-Apr-1933 Today's Date: 10/01/2012 Time: 4098-1191 OT Time Calculation (min): 18 min  OT Assessment / Plan / Recommendation Comments on Treatment Session Pt still overall min guard assist for simulated selfcare tasks and for mobility.  Will likely need assistive device (cane vs walker) for initial use at discharge.  Will await PT recommendations for this.      Follow Up Recommendations  No OT follow up;Supervision/Assistance - 24 hour       Equipment Recommendations  None recommended by OT       Frequency Min 2X/week   Plan Discharge plan remains appropriate    Precautions / Restrictions Precautions Precautions: Fall Restrictions Weight Bearing Restrictions: No   Pertinent Vitals/Pain Pain 5/10 in the left calf, pt repositioned    ADL  Grooming: Simulated;Min guard Where Assessed - Grooming: Supported standing Toilet Transfer: Mining engineer Method: Other (comment) (ambulate with hand held assist) Acupuncturist: Other (comment) (to EOB pt declined need to toilet) Toileting - Clothing Manipulation and Hygiene: Simulated;Min guard Where Assessed - Toileting Clothing Manipulation and Hygiene: Sit to stand from 3-in-1 or toilet Equipment Used: Gait belt Transfers/Ambulation Related to ADLs: Pt initially needing min assist for balance secondary to LLE pain and decreased ability to tolerate standing on the LLE.  Progressed to min guard but pt still needing UE support on rail. ADL Comments: Pt nauseated during session and vomited at the conclusion of ambulation.  Discussed seat options for the shower and pt reports that she thinks her friend may have a seat she can use.  If not she has been instructed on purchasing a small plastic seat for the tub as well.  Pt will likely need some type of assistive device (cane vs RW ) for initial use at home to [employer] walking.    OT Diagnosis:    OT Problem List:   OT Treatment Interventions:     OT Goals ADL Goals ADL Goal: Grooming - Progress: Progressing toward goals Pt Will Perform Lower Body Bathing: with modified independence;Sit to stand in shower;Sit to stand from chair Pt Will Perform Lower Body Dressing: with modified independence;Sit to stand from chair;Sit to stand from bed Pt Will Transfer to Toilet: with modified independence;Ambulation;Comfort height toilet Pt Will Perform Toileting - Clothing Manipulation: with modified independence;Standing ADL Goal: Toileting - Clothing Manipulation - Progress: Progressing toward goals Pt Will Perform Tub/Shower Transfer: with modified independence;Ambulation;Shower seat with back  Visit Information  Last OT Received On: 10/01/12 Assistance Needed: +1    Subjective Data  Subjective: They say my leg will keep hurting.      Cognition  Cognition Overall Cognitive Status: No family/caregiver present to determine baseline cognitive functioning    Mobility  Bed Mobility Bed Mobility: Supine to Sit;Sit to Supine Supine to Sit: 7: Independent Sitting - Scoot to Edge of Bed: 7: Independent Sit to Supine: 7: Independent Transfers Transfers: Sit to Stand Sit to Stand: 4: Min guard;With upper extremity assist;From bed Stand to Sit: 4: Min guard;To bed;With upper extremity assist       Balance Balance Balance Assessed: Yes Static Standing Balance Static Standing - Balance Support: No upper extremity supported Static Standing - Level of Assistance: 5: Stand by assistance   End of Session OT - End of Session Equipment Utilized During Treatment: Gait belt Activity Tolerance: Patient limited by pain;Other (comment) (also nauseated) Patient left: in bed;with call bell/phone within reach;with nursing in room  Nurse Communication: Other (comment) (nauseated)     , OTR/L Pager number [phone] 10/01/2012, 10:58 AM

## 2012-10-02 ENCOUNTER — Ambulatory Visit: Payer: Medicare Other | Admitting: Gastroenterology

## 2012-10-02 LAB — PROTIME-INR
INR: 1.62 — ABNORMAL HIGH (ref 0.00–1.49)
Prothrombin Time: 18.7 seconds — ABNORMAL HIGH (ref 11.6–15.2)

## 2012-10-02 MED ORDER — WARFARIN SODIUM 5 MG PO TABS
5.0000 mg | ORAL_TABLET | Freq: Every day | ORAL | Status: DC
  Administered 2012-10-02: 5 mg via ORAL
  Filled 2012-10-02 (×2): qty 1

## 2012-10-02 MED ORDER — WARFARIN SODIUM 5 MG PO TABS: 5.0000 mg | ORAL_TABLET | Freq: Every day | ORAL | Status: DC

## 2012-10-02 MED ORDER — WARFARIN VIDEO
Freq: Once | Status: AC
  Administered 2012-10-02: 17:00:00

## 2012-10-02 MED ORDER — ENOXAPARIN SODIUM 100 MG/ML ~~LOC~~ SOLN
1.5000 mg/kg | SUBCUTANEOUS | Status: DC
  Administered 2012-10-03: 90 mg via SUBCUTANEOUS
  Filled 2012-10-02 (×2): qty 1

## 2012-10-02 MED ORDER — PATIENT'S GUIDE TO USING COUMADIN BOOK
Freq: Once | Status: AC
  Administered 2012-10-02: 17:00:00
  Filled 2012-10-02: qty 1

## 2012-10-02 MED ORDER — ENOXAPARIN SODIUM 60 MG/0.6ML ~~LOC~~ SOLN: 1.5000 mg/kg | Freq: Every day | SUBCUTANEOUS | Status: DC

## 2012-10-02 NOTE — Progress Notes (Signed)
Physical Therapy Treatment Patient Details Name: Sherri Mcmillan MRN: [id number] DOB: 04/03/1933 Today's Date: 10/02/2012 Time: 4098-1191 PT Time Calculation (min): 14 min  PT Assessment / Plan / Recommendation Comments on Treatment Session  Patient given RW to use this session due to increased pain and instability. Patient wants to DC tomorrow as she will have help tomorrow and not tonight. RN made aware. Request patient receive RW at DC for home use    Follow Up Recommendations  No PT follow up;Supervision for mobility/OOB     Does the patient have the potential to tolerate intense rehabilitation     Barriers to Discharge        Equipment Recommendations  Rolling walker with 5" wheels    Recommendations for Other Services    Frequency Min 3X/week   Plan Discharge plan remains appropriate;Frequency remains appropriate    Precautions / Restrictions Precautions Precautions: Fall   Pertinent Vitals/Pain 9/10 leg pain. RN made aware    Mobility  Bed Mobility Bed Mobility: Not assessed Transfers Sit to Stand: 5: Supervision Stand to Sit: 5: Supervision Ambulation/Gait Ambulation/Gait Assistance: 5: Supervision Ambulation Distance (Feet): 100 Feet Assistive device: Rolling walker Ambulation/Gait Assistance Details: Patient did well with use of Rw. Increased pain with ambulation this morning. Small steps Gait Pattern: Step-through pattern;Antalgic Gait velocity: decreased    Exercises     PT Diagnosis:    PT Problem List:   PT Treatment Interventions:     PT Goals Acute Rehab PT Goals PT Transfer Goal: Bed to Chair/Chair to Bed - Progress: Progressing toward goal PT Goal: Ambulate - Progress: Progressing toward goal  Visit Information  Last PT Received On: 10/02/12 Assistance Needed: +1    Subjective Data      Cognition  Cognition Arousal/Alertness: Awake/alert Behavior During Therapy: WFL for tasks assessed/performed Overall Cognitive Status: Within  Functional Limits for tasks assessed    Balance     End of Session PT - End of Session Equipment Utilized During Treatment: Gait belt Activity Tolerance: Patient tolerated treatment well;Patient limited by pain Patient left: in chair;with call bell/phone within reach Nurse Communication: Mobility status;Patient requests pain meds   GP     Fredrich Birks 10/02/2012, 9:46 AM 10/02/2012 Fredrich Birks PTA [phone] pager [phone] office

## 2012-10-02 NOTE — Discharge Summary (Signed)
Patient Name:  Sherri Mcmillan MRN: [id number]  PCP: Dede Query, MD DOB:  [birth year]       Date of Admission:  09/28/2012  Date of Discharge:  10/03/2012      Attending Physician: Lars Mage, MD         DISCHARGE DIAGNOSES: 1. Left lower extremity DVT 2. Hypertension 3. GERD 4. Barrett's esophagus 5. Chronic pain 6. Chronic, normocytic anemia   DISPOSITION AND FOLLOW-UP: Sherri Mcmillan is to follow-up with the listed providers as detailed below, at which time, the following should be addressed:  1. Follow-up visits: 1. DVT:  Discharged on warfarin 2.5 mg QD with plans to recheck INR at follow up on 6/12. Also home with 2 more days (6/11 & 6/12) of enoxaparin, 90 mg [name] QD.  2. Labs and images needed: 1. INR  3. Pending labs and tests needing follow-up:  1. NONE   Follow-up Information   Follow up with LI, NA, MD On 10/05/2012. (INTERNAL MEDICINE - Your appointment is at 3:45 on Thursday, June 12.)    Contact information:   [address] [place] [address] [phone]      Discharge Orders   Future Appointments Provider Department Dept Phone   10/05/2012 3:45 PM Na Dierdre Searles, MD [facility] [phone]   Future Orders Complete By Expires     Call MD for:  difficulty breathing, headache or visual disturbances  As directed     Diet - low sodium heart healthy  As directed     Increase activity slowly  As directed         DISCHARGE MEDICATIONS:   Medication List    TAKE these medications       amLODipine 10 MG tablet  Commonly known as:  NORVASC  Take 1 tablet (10 mg total) by mouth daily.     enoxaparin 100 MG/ML injection  Commonly known as:  LOVENOX  Inject 0.9 mLs (90 mg total) into the skin every morning. For two doses only. Once on 6/11 and once on 6/12.  Start taking on:  10/04/2012     furosemide 40 MG tablet  Commonly known as:  LASIX  Take 1.5 tablets (60 mg total) by mouth daily.     HYDROcodone-acetaminophen 5-325 MG  per tablet  Commonly known as:  NORCO  Take 0.5-1 tablets by mouth every 4 (four) hours as needed for pain.     metoCLOPramide 5 MG tablet  Commonly known as:  REGLAN  Take 1 tablet (5 mg total) by mouth 3 (three) times daily before meals.     pantoprazole 40 MG tablet  Commonly known as:  PROTONIX  Take 1 tablet (40 mg total) by mouth daily.     promethazine 12.5 MG tablet  Commonly known as:  PHENERGAN  Take 1 tablet (12.5 mg total) by mouth every 6 (six) hours as needed for nausea.     sucralfate 1 G tablet  Commonly known as:  CARAFATE  Take 1 tablet (1 g total) by mouth 4 (four) times daily.     traMADol 50 MG tablet  Commonly known as:  ULTRAM  Take 1 tablet (50 mg total) by mouth every 6 (six) hours as needed for pain.     warfarin 5 MG tablet  Commonly known as:  COUMADIN  Take 0.5 tablets (2.5 mg total) by mouth every evening.         CONSULTS: NONE   PROCEDURES PERFORMED:  Left  lower extremity venous duplex 09/28/2012 SUMMARY: Findings consistent with acute deep vein thrombosis involving the soleal vein of theleft lower extremity. There is also superficial thrombophlebitis noted in multiple varicosities of the left posterior calf. No evidence of Baker's cyst on the left.    ADMISSION DATA: H&P: This is a 79 year old woman with hypertension, GERD, Barrett's esophagus, chronic anxiety, depression, and chronic pain; who came to clinic today for hospital followup. She was recently evaluated in the hospital for chest pain and was found to have severe reflux esophagitis. She has been started on pantoprazole 40 mg daily, metoclopramide 5 mg 3 times a day, and Carafate 1 tablet 4 times a day. At her followup visit today, she was found to have a swollen left lower leg that was severely tender to the touch. The patient is unable to detail in the history about this swelling and pain. She cannot tell us when it started or how it has changed over time. She only says that she  has a history of varicose veins. An ultrasound of the left lower leg demonstrated a DVT in the left soleal vein. She denies dyspnea and chest pain today.  Physical Exam: VITALS: BP 130/75, HR 65, RR 18, temp 2F, SpO2 100% on room air  GENERAL: well developed, well nourished; no acute distress  HEAD: atraumatic, normocephalic  EYES: pupils equal, round and reactive; sclera anicteric; normal conjunctiva  EARS: canals patent and TMs normal bilaterally  NOSE/THROAT: oropharynx clear, moist mucous membranes, pink gums, dentures in place  NECK: supple, no carotid bruits, thyroid normal in size and without palpable nodules  LYMPH: no cervical or supraclavicular lymphadenopathy  LUNGS: clear to auscultation bilaterally, normal work of breathing  HEART: normal rate and regular rhythm; normal S1 and S2 without S3 or S4; 2/6 crescendo-decrescendo systolic murmur heard best at the upper sternal borders  PULSES: radial and dorsalis pedis pulses are 2+ and symmetric  ABDOMEN: soft, non-tender, normal bowel sounds, no masses or organomegaly  SKIN: warm, dry, intact, normal turgor, no rashes  EXTREMITIES: exquisite tenderness with palpation of the left calf muscle and dorsiflexion of the left foot, no palpable cords, no tenderness in the right leg  PSYCH: patient is alert and oriented, mood and affect are normal and congruent   Labs: Basic Metabolic Panel:   09/28/12 1520   NA  139   K  3.8   CL  102   CO2  29   GLUCOSE  92   BUN  17   CREATININE  0.87   CALCIUM  9.6    CBC:   09/28/12 1520   WBC  4.9   HGB  11.3*   HCT  33.9*   MCV  87.4   PLT  212    Coagulation:   09/28/12 1520   LABPROT  12.9   INR  0.98     HOSPITAL COURSE: 1.   Left lower extremity DVT:  Patient's psychiatric history made it impossible to anticoagulate her at home initially. Rivaroxaban would be an excellent choice, but finances are limiting. She initially believed that she could afford for [name] and agreed  to switch to [name]. She received one dose of Xalreto in AM on 6/7. After discussion with her friend and her brother, she made her decision to switch back to coumadin due to the financial reason. INR=2.28.  Received 7.5 mg warfarin on 6/7, 5 on 6/8, and 5 mg on 6/9.  Home on 2.5 mg QD until follow up.  Also  home with two more days of enoxaparin, 90 mg [place] QD (6/11 & 6/12).  Home health RN to visit pt on 6/11 to assist with enoxaparin injections.  2.   Hypertension:  At home, she was taking amlodipine 10 mg daily and furosemide 60 mg daily. We will continue both of these. BP well controlled.   3.   GERD and Barrett's esophagus:  At home, she was taking metoclopramide 5 mg 3 times a day, pantoprazole 40 mg daily, sucralfate 1 g 4 times a day, and promethazine 12.5 mg every 6 hours when necessary for nausea. We will continue all of these. Hgb stable.   4.   Chronic pain:  At home, she was taking tramadol 50 mg every 6 hours when necessary and hydrocodone-acetaminophen 5-325 every 6 hours.  No changes made.  5.   Chronic, normocytic anemia:  Stable. Baseline hemoglobin in the range of 11 to 12. Hemoglobin is 11.6 on 6/8.   DISCHARGE DATA: Vital Signs: BP 135/75  Pulse 65  Temp(Src) 97.5 F (36.4 C) (Oral)  Resp 18  Ht 5' 0.24" (1.53 m)  Wt 134 lb 11.2 oz (61.1 kg)  BMI 26.1 kg/m2  SpO2 100%  LMP 04/26/1950  Labs: Coagulation Studies:   10/01/12 0551  10/02/12 0545  10/03/12 0620   PT 15.0  18.7*  24.1*   INR  1.20  1.62*  2.28*     Time spent on discharge: 34 minutes   Services Ordered on Discharge: 1. PT - no 2. OT - no 3. RN - YES 4. Other - no   Signed by:  Dorthula Rue. Earlene Plater, MD PGY-I, Internal Medicine  10/02/2012, 11:18 AM

## 2012-10-02 NOTE — Progress Notes (Signed)
Internal Medicine Teaching Service Attending Note Date: 10/02/2012  Patient name: Sherri Mcmillan  Medical record number: [id number]  Date of birth: 1932/07/30    This patient has been seen and discussed with the house staff. Please see their note for complete details. I concur with their findings with the following additions/corrections: Patient decided to change her mind about rivaroxaban on the weekend and wants to be on coumadin instead for her DVT. Patient is subtherapeutic today. She will be started on once a day lovenox for bridge while coumadin becomes therapeutic. Patient does not want to go home today. We are arranging things to discharge her home tomorrow.   Lars Mage 10/02/2012, 2:01 PM

## 2012-10-02 NOTE — Progress Notes (Addendum)
ANTICOAGULATION CONSULT NOTE - Follow Up Consult  Pharmacy Consult for [facility] Indication: DVT  Allergies  Allergen Reactions  . Milk-Related Compounds   . Aspirin Rash    Patient Measurements: Height: 5' 0.24" (153 cm) Weight: 134 lb 11.2 oz (61.1 kg) IBW/kg (Calculated) : 46.04   Vital Signs: Temp: 97.5 F (36.4 C) (06/09 0510) Temp src: Oral (06/09 0510) BP: 135/75 mmHg (06/09 0510) Pulse Rate: 65 (06/09 0510)  Labs:  Recent Labs  10/01/12 0551 10/02/12 0545  HGB 11.6*  --   HCT 34.7*  --   PLT 212  --   LABPROT 15.0 18.7*  INR 1.20 1.62*    Estimated Creatinine Clearance: 44.5 ml/min (by C-G formula based on Cr of 0.87).   Assessment: 79yo female with new DVT, calf tenderness is improved today.  INR 1.62 this AM after 3 doses of coumadin.  Hg and pltc stable yesterday.  No bleeding problems noted.  Today is day#3/5 minimum overlap for Lovenox and Coumadin.  Per MD, plan is to DC home on LMWH 1.5 mg/kg/day today after 2nd dose of 1 mg/kg q12h is given.  Goal of Therapy:  INR 2-3 Monitor platelets by anticoagulation protocol: Yes   Plan:  1.  LMWH 60 mg q12h - got dose at 6am today, to get 6pm dose then DC home with LMWH 90 mg q24 to start 6/10 and be administered by [facility]. 2. DC home with Rx for coumadin 5 mg daily for INR to be called to [phone], Dr. Earlene Plater, for f/u. 3. I have re-reordered coumadin book and video for education Herby Abraham, Pharm.D. [phone] 10/02/2012 11:42 AM

## 2012-10-02 NOTE — Progress Notes (Signed)
Subjective:    Interval Events:  Patient feels well except for pain in her left leg that is gradually improving and indigestion/reflux/heartburn.     Objective:    Vital Signs:   Temp:  [97.5 F-98.4 F] 97.5 F (36.4 C) (06/09 0510) Pulse Rate:  [65-78] 65 (06/09 0510) Resp:  [18] 18 (06/09 0510) BP: (120-135)/(63-75) 135/75 mmHg (06/09 0510) SpO2:  [96 %-100 %] 100 % (06/09 0510)   Weights: 24-hour Weight change: 2 lb 3.3 oz (1 kg)  Filed Weights   09/30/12 0532 10/01/12 0528 10/02/12 0510  Weight: 135 lb 2.3 oz (61.3 kg) 132 lb 7.9 oz (60.1 kg) 134 lb 11.2 oz (61.1 kg)   Net since admission:  -1.1kg   Intake/Output:   Gross per 24 hour  Intake    240 ml  Output      0 ml  Net    240 ml     Last BM Date: 09/27/12   Physical Exam: GENERAL: alert and oriented; resting comfortably in bed and in no distress EYES: pupils equal, round, and reactive to light; sclera anicteric ENT: moist mucosa LUNGS: clear to auscultation bilaterally, normal work of breathing CHEST: tenderness with palpation of the left anterior chest wall HEART: normal rate; regular rhythm ABDOMEN: soft, non-tender, normal bowel sounds, no masses palpated EXTREMITIES: no edema, tenderness with palpation of the left calf, palpable venous cords in the left lower leg SKIN: normal turgor    Labs: Coagulation Studies:  10/01/12 0551 10/02/12 0545  LABPROT 15.0 18.7*  INR 1.20 1.62*    Imaging: No results found.    Medications:    Infusions:     Scheduled Medications: . amLODipine  10 mg Oral Daily  . enoxaparin (LOVENOX) injection  60 mg Subcutaneous Q12H  . furosemide  60 mg Oral Daily  . metoCLOPramide  5 mg Oral TID AC  . pantoprazole  40 mg Oral Daily  . senna-docusate  1 tablet Oral BID  . sodium chloride  3 mL Intravenous Q12H  . sucralfate  1 g Oral TID AC & HS  . Warfarin - Pharmacist Dosing Inpatient   Does not apply q1800     PRN Medications: sodium chloride, alum  & mag hydroxide-simeth, HYDROcodone-acetaminophen, promethazine, sodium chloride    Assessment/ Plan:    1.   Left lower extremity DVT:  Patient's psychiatric history makes it unlikely she would be able to manage initial anticoagulation at home with enoxaparin.  Rivaroxaban would be an excellent choice, but finances limit this. She initially believed that she could afford for [facility] and agreed to switch to [place]. She received one dose of Xalreto in AM on 6/7. After discussion with her friend and her brother, she made her decision to switch back to coumadin due to the financial reason.  INR=1.62.  - Continue Coumadin and Lovenox bridge - Enoxaparin dosing per pharmacy - Warfarin dosing per pharmacy  2.   Hypertension:  At home, she was taking amlodipine 10 mg daily and furosemide 60 mg daily. We will continue both of these.  BP well controlled. - Continue amlodipine 10 mg daily  - Continue furosemide 60 mg daily   3.   GERD and Barrett's esophagus:  At home, she was taking metoclopramide 5 mg 3 times a day, pantoprazole 40 mg daily, sucralfate 1 g 4 times a day, and promethazine 12.5 mg every 6 hours when necessary for nausea. We will continue all of these. Hgb stable.  - Continue metoclopramide 5 mg  3 times a day before meals  - Continue pantoprazole 40 mg daily  - Continue promethazine PO 12.5 mg every 6 hours when necessary for nausea  - Continue sucralfate 1 g 3 times a day   4.   Chronic pain:  At home, she was taking tramadol 50 mg every 6 hours when necessary and hydrocodone-acetaminophen 5-325 every 6 hours.  Advanced to morphine overnight.  We will go back to oral analgesia today. - Continue hydrocodone-acetaminophen 5 history 25, 1-2 tablets every 4 hours when necessary   5.   Chronic, normocytic anemia:  Stable. Baseline hemoglobin in the range of 11 to 12. Hemoglobin is 11.6 on 6/8.   6.   Prophylaxis:  - Senna-docusate BID for bowel regimen  - Therapeutic enoxaparin for VTE    7.   Disposition: OPC patient. Will need follow up with [place]. Patient can not do the Lovenox injection at home. May need to be in hospital until INR therapeutic.    Length of Stay: 4 days   Signed by:  Dorthula Rue. Earlene Plater, MD PGY-I, Internal Medicine Pager [phone]  10/02/2012, 8:35 AM

## 2012-10-03 LAB — PROTIME-INR: INR: 2.28 — ABNORMAL HIGH (ref 0.00–1.49)

## 2012-10-03 MED ORDER — ENOXAPARIN SODIUM 100 MG/ML ~~LOC~~ SOLN: 90.0000 mg | Freq: Every morning | SUBCUTANEOUS | Status: DC

## 2012-10-03 MED ORDER — WARFARIN SODIUM 5 MG PO TABS: 2.5000 mg | ORAL_TABLET | Freq: Every evening | ORAL | Status: DC

## 2012-10-03 NOTE — Progress Notes (Signed)
Have collaborated with this patient and note.  10/03/2012  [place], PT [phone]  (pager)

## 2012-10-03 NOTE — Progress Notes (Signed)
Internal Medicine Teaching Service Attending Note Date: 10/03/2012  Patient name: Sherri Mcmillan  Medical record number: [id number]  Date of birth: 30-Dec-1932    This patient has been seen and discussed with the house staff. Please see their note for complete details. I concur with their findings with the following additions/corrections: Patient will be discharged home today on Coumadin with 2 more days of Lovenox to bridge. Patient will followup in clinic later this week. Please see discharge summary for complete details.  Lars Mage 10/03/2012, 10:21 AM

## 2012-10-03 NOTE — Progress Notes (Signed)
Pt discharged to home

## 2012-10-03 NOTE — Progress Notes (Signed)
ANTICOAGULATION CONSULT NOTE - Follow Up Consult  Pharmacy Consult for [facility] Indication: DVT  Allergies  Allergen Reactions  . Milk-Related Compounds   . Aspirin Rash    Patient Measurements: Height: 5' 0.24" (153 cm) Weight: 134 lb 11.2 oz (61.1 kg) IBW/kg (Calculated) : 46.04   Vital Signs: Temp: 98.3 F (36.8 C) (06/10 0542) Temp src: Oral (06/10 0542) BP: 103/68 mmHg (06/10 0542) Pulse Rate: 78 (06/10 0542)  Labs:  Recent Labs  10/01/12 0551 10/02/12 0545 10/03/12 0620  HGB 11.6*  --   --   HCT 34.7*  --   --   PLT 212  --   --   LABPROT 15.0 18.7* 24.1*  INR 1.20 1.62* 2.28*    Estimated Creatinine Clearance: 43.7 ml/min (by C-G formula based on Cr of 0.87).   Assessment: 79 y.o. F on lovenox/warfarin bridge for new LLE DVT. Today is VTE overlap D#4/5 as recommended per CHEST guidelines. INR today is therapeutic (INR 2.28 << 1.62, goal of 2-3).   The patient was intended to be discharged yesterday and recommendations were made at that time. Given the patient's newest trend in INR -- the recommendations have been adjusted to be warfarin 2.5 mg daily with Lovenox 90 mg/24h until the patient is followed up with another INR check on Thursday, 6/12. This was discussed with the physician and updated on the discharge summary.  The patient and her family member were educated on warfarin today prior to discharge.   Goal of Therapy:  INR 2-3 Monitor platelets by anticoagulation protocol: Yes   Plan:  1. Warfarin 2.5 mg x 1 dose at 1800 (if patient still here) 2. Continue Lovenox 90 mg SQ every 24 hours  3. Per discussion with the MD -- the patient is to have a follow-up INR check on Thurs, 6/12. 4. Will continue to monitor for any signs/symptoms of bleeding and will follow up with PT/INR in the a.m. (if still here)   Georgina Pillion, PharmD, BCPS Clinical Pharmacist Pager: [phone] 10/03/2012 1:43 PM

## 2012-10-03 NOTE — Progress Notes (Signed)
Subjective:    Interval Events:  Patient feels well this morning and is ready to go home.  She does complain of a vague sensation in her chest, described best as an anxious feeling with her breathing.  She denies chest pain.  She has a mild cough, producing beige to white sputum but this is no worse than usual.    Objective:    Vital Signs:   Temp:  [97.4 F-98.3 F] 98.3 F (36.8 C) (06/10 0542) Pulse Rate:  [67-78] 78 (06/10 0542) Resp:  [18-20] 18 (06/10 0542) BP: (103-136)/(58-75) 103/68 mmHg (06/10 0542) SpO2:  [98 %-100 %] 100 % (06/10 0542)   Weights: 24-hour Weight change: 0 lb (0 kg)  Filed Weights   10/01/12 0528 10/02/12 0510 10/03/12 0542  Weight: 132 lb 7.9 oz (60.1 kg) 134 lb 11.2 oz (61.1 kg) 134 lb 11.2 oz (61.1 kg)   Net since admission:  -1.1kg   Intake/Output:  No intake or output data in the 24 hours ending 10/03/12 0936    Last BM Date: 09/28/12   Physical Exam: GENERAL: alert and oriented; resting comfortably in bed and in no distress EYES: pupils equal, round, and reactive to light; sclera anicteric LUNGS: clear to auscultation bilaterally, normal work of breathing HEART: normal rate; regular rhythm; 2/6 systolic murmur ABDOMEN: soft, non-tender, normal bowel sounds, no masses palpated EXTREMITIES: no edema; tender, palpable cords in left lower leg SKIN: normal turgor    Labs: Coagulation Studies:  10/01/12 0551 10/02/12 0545 10/03/12 0620  LABPROT 15.0 18.7* 24.1*  INR 1.20 1.62* 2.28*    Imaging: No results found.    Medications:    Infusions:     Scheduled Medications: . amLODipine  10 mg Oral Daily  . enoxaparin (LOVENOX) injection  1.5 mg/kg Subcutaneous Q24H  . furosemide  60 mg Oral Daily  . metoCLOPramide  5 mg Oral TID AC  . pantoprazole  40 mg Oral Daily  . senna-docusate  1 tablet Oral BID  . sodium chloride  3 mL Intravenous Q12H  . sucralfate  1 g Oral TID AC & HS  . warfarin  5 mg Oral q1800  . Warfarin  - Pharmacist Dosing Inpatient   Does not apply q1800     PRN Medications: sodium chloride, alum & mag hydroxide-simeth, HYDROcodone-acetaminophen, promethazine, sodium chloride    Assessment/ Plan:    1.   Left lower extremity DVT:  Patient's psychiatric history made it impossible to anticoagulate her at home initially.  Rivaroxaban would be an excellent choice, but finances are limiting. She initially believed that she could afford for [facility] and agreed to switch to [place]. She received one dose of Xalreto in AM on 6/7. After discussion with her friend and her brother, she made her decision to switch back to coumadin due to the financial reason.  INR=2.28. - Continue Coumadin 5 mg daily - Continue enoxaparin 90 mg [place] QD for two more days  2.   Hypertension:  At home, she was taking amlodipine 10 mg daily and furosemide 60 mg daily. We will continue both of these.  BP well controlled. - Continue amlodipine 10 mg daily  - Continue furosemide 60 mg daily   3.   GERD and Barrett's esophagus:  At home, she was taking metoclopramide 5 mg 3 times a day, pantoprazole 40 mg daily, sucralfate 1 g 4 times a day, and promethazine 12.5 mg every 6 hours when necessary for nausea. We will continue all of these. Hgb stable.  -  Continue metoclopramide 5 mg 3 times a day before meals  - Continue pantoprazole 40 mg daily  - Continue promethazine PO 12.5 mg every 6 hours when necessary for nausea  - Continue sucralfate 1 g 3 times a day   4.   Chronic pain:  At home, she was taking tramadol 50 mg every 6 hours when necessary and hydrocodone-acetaminophen 5-325 every 6 hours.  Advanced to morphine overnight.  We will go back to oral analgesia today. - Continue hydrocodone-acetaminophen 5 history 25, 1-2 tablets every 4 hours when necessary   5.   Chronic, normocytic anemia:  Stable. Baseline hemoglobin in the range of 11 to 12. Hemoglobin is 11.6 on 6/8.   6.   Prophylaxis:  - Senna-docusate BID for  bowel regimen  - Therapeutic enoxaparin for VTE   7.   Disposition: OPC patient. Follow up established for Thursday. Home today with [facility] RN to visit tomorrow.    Length of Stay: 5 days   Signed by:  Dorthula Rue. Earlene Plater, MD PGY-I, Internal Medicine Pager [phone]  10/03/2012, 9:36 AM

## 2012-10-04 ENCOUNTER — Telehealth: Payer: Self-pay | Admitting: *Deleted

## 2012-10-04 NOTE — Telephone Encounter (Signed)
[facility] calls and states PT today 25.7                                    INR today 2.1 She is taking lovenox 1 time daily x 2 days, today is #1 also taking 1/2 tab 5mg  coumadin daily Please advise disch from hosp 6/10

## 2012-10-04 NOTE — Telephone Encounter (Signed)
Attempted to call patient at 1020 pm again. Unable to reach patient or leave a phone message . Her message box is till full. Will try to call her tomorrow.

## 2012-10-04 NOTE — Telephone Encounter (Signed)
Attempted to call patient three times between 730 pm and 830 pm after the clinic day. Unable to reach patient. Unable to leave a phone message since her voice box is full.

## 2012-10-05 ENCOUNTER — Ambulatory Visit (INDEPENDENT_AMBULATORY_CARE_PROVIDER_SITE_OTHER): Payer: Medicare Other | Admitting: Internal Medicine

## 2012-10-05 ENCOUNTER — Telehealth: Payer: Self-pay | Admitting: Internal Medicine

## 2012-10-05 ENCOUNTER — Encounter: Payer: Self-pay | Admitting: Internal Medicine

## 2012-10-05 ENCOUNTER — Telehealth: Payer: Self-pay | Admitting: *Deleted

## 2012-10-05 VITALS — BP 117/71 | HR 70 | Temp 97.9°F | Ht 60.25 in | Wt 135.6 lb

## 2012-10-05 DIAGNOSIS — I82402 Acute embolism and thrombosis of unspecified deep veins of left lower extremity: Secondary | ICD-10-CM

## 2012-10-05 DIAGNOSIS — I82409 Acute embolism and thrombosis of unspecified deep veins of unspecified lower extremity: Secondary | ICD-10-CM

## 2012-10-05 DIAGNOSIS — K2100 Gastro-esophageal reflux disease with esophagitis, without bleeding: Secondary | ICD-10-CM

## 2012-10-05 LAB — POCT INR: INR: 2.4

## 2012-10-05 NOTE — Telephone Encounter (Signed)
[facility] called, she was informed that dr Dierdre Searles had tried numerous times to call pt and pt's vmail was full. [facility] will see pt this am and give last dose of lovenox, pt will then f/u in clinic today, seeing dr Dierdre Searles at that time PT/ INR will be done and continued therapy will be decided.  [facility]  Ph [phone]. She will call for problems, dr Dierdre Searles will call her for update from visit

## 2012-10-05 NOTE — Patient Instructions (Addendum)
1. Please schedule a follow up visit with Dr. Alexandria Lodge on Monday on 6/16 2. Please see me in August.  3. Please continue to take your Warfarin 2.5 mg po daily at 5-6 pm

## 2012-10-05 NOTE — Telephone Encounter (Signed)
Attempted to call patient at 900 am this morning. Unable to reach patient or leave a message. The message box is full.

## 2012-10-05 NOTE — Telephone Encounter (Signed)
Agree with the plan.

## 2012-10-05 NOTE — Progress Notes (Signed)
Subjective:   Patient ID: Sherri Mcmillan female   DOB: 1933/04/19 79 y.o.   MRN: [id number]  HPI: [name] is a 79 y.o. woman with PMH of hypertension, GERD, Barrett's esophagus, gastroparesis, migraine, chronic anxiety, chronic insomnia, depression, and chronic pain, who presents to the clinic for hospital followup.  Patient was admitted for evaluation of LLE DVT on 09/28/12, and was treated with Lovenox and Warfarin. She was discharged on 10/03/12 with instructions to continue Lovenox 90 mg SQ daily x 2 days ( 10/04/12 and 10/05/12). She had the last Lovenox this am. She is here for follow up.   She is doing well. No c/o. No bruising or bleeding. She reports medical compliance with the diet and coumadin.   Past Medical History  Diagnosis Date  . Chronic pain     DJD knees, back, migranes, LLE varicose veins, and LLE neuropathy.   . Aortic stenosis     Dx ECHO 2008 , mild and aymptomatic , needs ECHO q 3-5 yrs  . Barrett's esophagus     Demonstrated on EGD 12/2010. EGD 09/17/2012 shows inflamed GE junctional mucosa without metaplasia, dysplasia, or malignancy.  . Chronic insomnia   . Chronic anxiety     Admission to [facility] (90s or early 2000)  for 6 weeks after mother died. Complicated again by death of her sister 2010. 2012 developed hallucinations and I refused to refill controlled meds unless she see psych which she is not agreable to  . Elevated cholesterol 10/11    LDL 155. Her 10 year risk (decreasing her age to 60 as the calculator won't go to age 37) is 21% ish. If consider her non smoker (which I wouldn't even though she says 1 pak lasts 2 weeks) risk is 12% ish. So goal for LDL is 130 or 100.  Marland Kitchen HTN (hypertension)     Controlled with 2 drug therapy  . Migraine   . Cataract   . Depression   . Gastroparesis     Demonstrated on GES 12/2010 by Dr Dalene Seltzer  . Bronchitis     hx of  . Shortness of breath   . Left leg DVT 09/28/2012   Current Outpatient Prescriptions  Medication  Sig Dispense Refill  . amLODipine (NORVASC) 10 MG tablet Take 1 tablet (10 mg total) by mouth daily.  90 tablet  4  . furosemide (LASIX) 40 MG tablet Take 1.5 tablets (60 mg total) by mouth daily.  135 tablet  4  . HYDROcodone-acetaminophen (NORCO) 5-325 MG per tablet Take 0.5-1 tablets by mouth every 4 (four) hours as needed for pain.  10 tablet  0  . metoCLOPramide (REGLAN) 5 MG tablet Take 1 tablet (5 mg total) by mouth 3 (three) times daily before meals.  90 tablet  1  . pantoprazole (PROTONIX) 40 MG tablet Take 1 tablet (40 mg total) by mouth daily.  90 tablet  4  . promethazine (PHENERGAN) 12.5 MG tablet Take 1 tablet (12.5 mg total) by mouth every 6 (six) hours as needed for nausea.  30 tablet  1  . sucralfate (CARAFATE) 1 G tablet Take 1 tablet (1 g total) by mouth 4 (four) times daily.  120 tablet  4  . traMADol (ULTRAM) 50 MG tablet Take 1 tablet (50 mg total) by mouth every 6 (six) hours as needed for pain.  30 tablet  0  . warfarin (COUMADIN) 5 MG tablet Take 0.5 tablets (2.5 mg total) by mouth every evening.  30 tablet  0  No current facility-administered medications for this visit.   Family History  Problem Relation Age of Onset  . Stroke Neg Hx   . Cancer Neg Hx   . Colon cancer Neg Hx   . Anesthesia problems Neg Hx   . Hypotension Neg Hx   . Malignant hyperthermia Neg Hx   . Pseudochol deficiency Neg Hx   . Heart disease Mother   . Hypertension Mother    History   Social History  . Marital Status: Single    Spouse Name: N/A    Number of Children: N/A  . Years of Education: N/A   Social History Main Topics  . Smoking status: Former Smoker    Types: Cigarettes    Quit date: 06/01/2010  . Smokeless tobacco: Never Used  . Alcohol Use: No  . Drug Use: No  . Sexually Active: No   Other Topics Concern  . 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   HEENT:  Denies congestion, sore throat, rhinorrhea, sneezing, mouth sores, trouble swallowing, neck pain   Respiratory:  Denies SOB, DOE, cough, and wheezing.   Cardiovascular:  Denies palpitations and leg swelling.   Gastrointestinal:  Denies nausea, vomiting, abdominal pain, diarrhea, constipation, blood in stool and abdominal distention.    Genitourinary:  Denies dysuria, urgency, frequency, hematuria, flank pain and difficulty urinating.   Musculoskeletal:  Denies myalgias, back pain, arthralgias and gait problem. LLE mild swelling.  Skin:  Denies pallor, rash and wound.   Neurological:  Denies dizziness, seizures, syncope, weakness, light-headedness, numbness and headaches.    .    Objective:  Physical Exam: Filed Vitals:   10/05/12 1602  BP: 117/71  Pulse: 70  Temp: 97.9 F (36.6 C)  TempSrc: Oral  Height: 5' 0.25" (1.53 m)  Weight: 135 lb 9.6 oz (61.508 kg)  SpO2: 100%   General: alert, well-developed, and cooperative to examination.  Head: normocephalic and atraumatic.  Eyes: vision grossly intact, pupils equal, pupils round, pupils reactive to light, no injection and anicteric.  Mouth: pharynx pink and moist, no erythema, and no exudates.  Neck: supple, full ROM, no thyromegaly, no JVD, and no carotid bruits.  Lungs: normal respiratory effort, no accessory muscle use, normal breath sounds, no crackles, and no wheezes. Heart: normal rate, regular rhythm, no murmur, no gallop, and no rub.  Abdomen: soft, nontender, normal bowel sounds, no distention, no guarding, no rebound tenderness, no hepatomegaly, and no splenomegaly.  Msk: no joint swelling, no  joint warmth, and no redness over joints.  Pulses: 2+ DP/PT pulses bilaterally Extremities: No cyanosis, clubbing. LLE mild swelling.  Neurologic: alert & oriented X3, cranial nerves II-XII intact, strength normal in all extremities, sensation intact to light touch, and gait normal.  Skin: turgor normal and no rashes.  Psych: Oriented X3, memory intact for recent and remote, normally interactive, good eye contact, not anxious appearing, and not depressed appearing.    Assessment & Plan:

## 2012-10-05 NOTE — Assessment & Plan Note (Addendum)
Patient is here for hospital  follow up for left leg DVT.  She reports medical compliance with her Coumadin therapy. Denies chest pain, chest pressure or shortness breath.  Denies bleeding or bruising.  - INR is 2.4 today. -Will continue current Coumadin therapy 2.5 mg by mouth daily - Patient will need to set up a follow up appointment with Dr. Alexandria Lodge on Monday.  - Dietary restrictions with Coumadin therapy is discussed with patient.

## 2012-10-05 NOTE — Telephone Encounter (Signed)
  INTERNAL MEDICINE RESIDENCY PROGRAM After-Hours Telephone Call    Reason for call:   I placed an outgoing call to home health agency at 0920 am regarding to her anticoagulant therapy.     Pertinent Data:   Received a phone call yesterday indicating INR was 2.1.   Patient was discharged home on 10/03/12 after treatment of LLE DVT.  She was discharged on warfarin 2.5 mg by mouth daily along with Lovenox 90 mg subcutaneous daily x 2 days.  Her day #1 Lovenox was given yesterday.    Assessment/ Plan:   Called and spoke to inpatient pharmacist who suggested that we should finish 5 days of Lovenox therapy even if her INR is therapeutic.  I verified with the discharge resident Dr. Gwynn Burly that her fifth day of Lovenox therapy will be on 10/05/12.  Home health nurse is notified to give her last dose of Lovenox this morning, and reinforce to patient to come to the office visit at 3:45 PM today.  Of note, I have attempted to call patient 5 times since yesterday and unable to reach her or leave a message ( voice box was full).  As always, pt is advised that if symptoms worsen or new symptoms arise, they should go to an urgent care facility or to to ER for further evaluation.    Dede Query, MD   10/05/2012, 9:23 AM

## 2012-10-09 NOTE — Assessment & Plan Note (Signed)
Patient is asymptomatic, and reports compliance with her medical treatment. We'll reschedule her followup appointment with her GI doctor.  She is to followup with Dr. Arlyce Dice on 10/18/12.

## 2012-10-11 ENCOUNTER — Telehealth: Payer: Self-pay | Admitting: *Deleted

## 2012-10-11 NOTE — Telephone Encounter (Signed)
Pt was seen by a fill-in HHN Monday and was given lovenox .9 inj to abd, HHN note states she called triage at 1245 and was told to give the lovenox, it is not documented whom she spoke to and there is no record of a call. Usual HHN karen calls today to inform dr Dierdre Searles of this event and that today pt informed her she was to get another dose of lovenox, she states pt had 1 more vial of lovenox, she states she tried to call the office and was on hold for 20 mins waiting to speak to someone, so she checked the INR it was 1.9 and gave the last inj of lovenox. Dr Dierdre Searles was informed of these events and states she did not order this and it was not ordered at disch, she states it is documented that pt was given 2 inj of lovenox not 4, HHN states pt brought 4 inj of lovenox home and was sure she was to get all of them, HHN states she will file an incident report with [facility]. Dr Dierdre Searles has ordered that pt be evaluated thurs 6/19 and fri 6/20 for signs of abdominal hematoma and then to see dr groce mon, appt is set for mon at 0930, she is to continue 2.5mg  of coumadin as previously directed. Dr Dierdre Searles ask this be sent to dr butcher, dr Alexandria Lodge and herself

## 2012-10-11 NOTE — Telephone Encounter (Signed)
Noted. Since INR subtherapeutic and recent DVT, probably OK to have gotten the one injection lovenox

## 2012-10-13 ENCOUNTER — Other Ambulatory Visit: Payer: Self-pay | Admitting: Internal Medicine

## 2012-10-16 ENCOUNTER — Ambulatory Visit (INDEPENDENT_AMBULATORY_CARE_PROVIDER_SITE_OTHER): Payer: Medicare Other | Admitting: Pharmacist

## 2012-10-16 ENCOUNTER — Telehealth: Payer: Self-pay | Admitting: *Deleted

## 2012-10-16 DIAGNOSIS — I82402 Acute embolism and thrombosis of unspecified deep veins of left lower extremity: Secondary | ICD-10-CM

## 2012-10-16 DIAGNOSIS — I82409 Acute embolism and thrombosis of unspecified deep veins of unspecified lower extremity: Secondary | ICD-10-CM

## 2012-10-16 NOTE — Telephone Encounter (Signed)
Talked with Consuella Lose - nurse with [facility] per Dr Alexandria Lodge about note on Lovenox and Protonix - faxed to [phone]. Also faxed Coumadin instructions to [facility]. Stanton Kidney  RN 10/16/12 12N

## 2012-10-16 NOTE — Progress Notes (Signed)
Anti-Coagulation Progress Note  Sherri Mcmillan is a 79 y.o. female who is currently on an anti-coagulation regimen.    RECENT RESULTS: Recent results are below, the most recent result is correlated with a dose of 2.5mg  per day since last seen my Dr. Dierdre Searles. She will go BACK on Lovenox 1.5mg /kg SQ q24h (90mg  dose)---her dose for today was adminstered. Debbie the Triage Nurse is arranging for [facility] to visit with patient ONCE DAILY for ONCE-DAILY Lovenox injections for next 4 days commencing tomorrow in the home setting. She will return to clinic next Monday to see me. I have INCREASED her daily warfarin.  Lab Results  Component Value Date   INR 2.4 10/05/2012   INR 2.28* 10/03/2012   INR 1.62* 10/02/2012    ANTI-COAG DOSE:    ANTICOAG SUMMARY: Anticoagulation Episode Summary   Current INR goal 2.0-3.0  Next INR check   INR from last check   Most recent INR 2.4 (10/05/2012)  Weekly max dose   Target end date Indefinite  [facility]  Preferred lab   Send INR reminders to    Indications  Left leg DVT [453.40] Left leg DVT (Resolved) [453.40]        Comments         ANTICOAG TODAY: Anticoagulation Summary as of 10/16/2012   INR goal 2.0-3.0  Selected INR   Next INR check   Target end date Indefinite   Indications  Left leg DVT [453.40] Left leg DVT (Resolved) [453.40]      Anticoagulation Episode Summary   [facility]   Preferred lab    Send INR reminders to    Comments       PATIENT INSTRUCTIONS: There are no Patient Instructions on file for this visit.   FOLLOW-UP Return in 7 days (on 10/23/2012) for Follow up INR at 0945h.  Hulen Luster, III Pharm.D., CACP

## 2012-10-17 ENCOUNTER — Telehealth: Payer: Self-pay | Admitting: *Deleted

## 2012-10-17 ENCOUNTER — Other Ambulatory Visit: Payer: Self-pay | Admitting: Internal Medicine

## 2012-10-17 MED ORDER — ENOXAPARIN SODIUM 100 MG/ML ~~LOC~~ SOLN: 1.5000 mg/kg | Freq: Every day | SUBCUTANEOUS | Status: AC

## 2012-10-17 NOTE — Telephone Encounter (Signed)
Dr Rogelia Boga called in 4  Lovenox injections to pharmacy. Talked with Dr Alexandria Lodge - stated he was going to pts home to give injection today. Dr Alexandria Lodge talked to [facility] and to pt. Stanton Kidney  RN 10/19/12 4PM

## 2012-10-18 ENCOUNTER — Encounter: Payer: Self-pay | Admitting: Gastroenterology

## 2012-10-18 ENCOUNTER — Telehealth: Payer: Self-pay | Admitting: Internal Medicine

## 2012-10-18 ENCOUNTER — Other Ambulatory Visit: Payer: Self-pay | Admitting: Internal Medicine

## 2012-10-18 ENCOUNTER — Ambulatory Visit (INDEPENDENT_AMBULATORY_CARE_PROVIDER_SITE_OTHER): Payer: Medicare Other | Admitting: Gastroenterology

## 2012-10-18 VITALS — BP 90/60 | HR 72 | Ht 63.0 in | Wt 129.0 lb

## 2012-10-18 DIAGNOSIS — K219 Gastro-esophageal reflux disease without esophagitis: Secondary | ICD-10-CM

## 2012-10-18 DIAGNOSIS — K2270 Barrett's esophagus without dysplasia: Secondary | ICD-10-CM

## 2012-10-18 DIAGNOSIS — K222 Esophageal obstruction: Secondary | ICD-10-CM

## 2012-10-18 MED ORDER — ESOMEPRAZOLE MAGNESIUM 40 MG PO CPDR: 40.0000 mg | DELAYED_RELEASE_CAPSULE | Freq: Every day | ORAL | Status: DC

## 2012-10-18 MED ORDER — OMEPRAZOLE-SODIUM BICARBONATE 40-1100 MG PO CAPS: ORAL_CAPSULE | ORAL | Status: DC

## 2012-10-18 NOTE — Assessment & Plan Note (Signed)
She is symptomatic complaining of dysphagia. I will defer dilatation since she is on Lovenox and Coumadin for her DVT

## 2012-10-18 NOTE — Assessment & Plan Note (Signed)
Plan followup endoscopy in 3 years 

## 2012-10-18 NOTE — Assessment & Plan Note (Signed)
Patient is symptomatic despite taking proton X. and Carafate. Symptoms are especially severe at night.  Recommendations #1 trial of Zegerid every morning and each bedtime; continue Carafate #2 the patient is not improved I would consider restarting Reglan since a gastric emptying scan from 2012 showed gastroparesis (most recent gastric emptying scan was normal)

## 2012-10-18 NOTE — Telephone Encounter (Signed)
Called and spoke to Dr. Alexandria Mcmillan re: Sherri Mcmillan.  It was noticed on her problem list that she has a history of Barrett's esophagus and esophagitis.  Sherri Mcmillan reported NOT being on a PPI.  A home health nurse mistakenly reported that she did in fact have a bottle of protonix at home.   Dr. Alexandria Mcmillan, on delivering medication to Sherri Mcmillan, reviewed her medications and noted that she did not in fact have protonix (pantoprazole at home). He continued investigating to find that she did have an Rx on file at her pharmacy, however, there is a national back order on protonix and it could not be filled.  Therefore, Sherri Mcmillan does not have a PPI, which is required given her diagnoses.   Dr. Alexandria Mcmillan tried to contact Sherri Mcmillan unsuccessfully.  As I am in the clinic this AM, I place a new Rx for Nexium 40mg  PO daily for Sherri Mcmillan to have in the interim until her protonix can be filled.    Will pass this note along to Sherri Mcmillan for her information.

## 2012-10-18 NOTE — Progress Notes (Signed)
History of Present Illness:  Sherri Mcmillan has returned for evaluation of reflux. Despite taking Protonix and Carafate she's having burning chest discomfort, especially at night. She's also having mild dysphagia. Reason endoscopy demonstrated esophagitis and an early esophageal stricture. Biopsies demonstrated esophagitis.   Barrett's was not seen. A gastric emptying scan was negative. She was hospitalized earlier this month with a DVT.    Review of Systems: Pertinent positive and negative review of systems were noted in the above HPI section. All other review of systems were otherwise negative.    Current Medications, Allergies, Past Medical History, Past Surgical History, Family History and Social History were reviewed in [facility] electronic medical record  Vital signs were reviewed in today's medical record. Physical Exam: General: Well developed , well nourished, no acute distress

## 2012-10-18 NOTE — Patient Instructions (Signed)
Follow up in one month.

## 2012-10-19 ENCOUNTER — Telehealth: Payer: Self-pay | Admitting: Gastroenterology

## 2012-10-19 ENCOUNTER — Telehealth: Payer: Self-pay | Admitting: *Deleted

## 2012-10-19 NOTE — Telephone Encounter (Signed)
Call from [place], RN wit [facility] - # [phone] Nurse called with question about # of doses on Lovenox to give. I talked with Dr Alexandria Lodge and He said to given the 4 doses as he left 4 syringes of Lovenoxat pt's home.  Nurse informed.

## 2012-10-19 NOTE — Telephone Encounter (Signed)
Patient did not get Zegerid.  A doctor  by the name of Ines Bloomer sent in Nexium yesterday.  Patients care giver confused and will contact Dr Sabino Gasser office

## 2012-10-23 ENCOUNTER — Ambulatory Visit (INDEPENDENT_AMBULATORY_CARE_PROVIDER_SITE_OTHER): Payer: Medicare Other | Admitting: Pharmacist

## 2012-10-23 DIAGNOSIS — I82409 Acute embolism and thrombosis of unspecified deep veins of unspecified lower extremity: Secondary | ICD-10-CM

## 2012-10-23 DIAGNOSIS — I82402 Acute embolism and thrombosis of unspecified deep veins of left lower extremity: Secondary | ICD-10-CM

## 2012-10-23 NOTE — Progress Notes (Signed)
Anti-Coagulation Progress Note  Sherri Mcmillan is a 79 y.o. female who is currently on an anti-coagulation regimen.    RECENT RESULTS: Recent results are below, the most recent result is correlated with a dose of 30 mg. per week with concomitant LMWH 1.5mg /kg based upon 60kg; dose = 90mg  (from 100mg  strength syringes). No more Lovenox necessary.  Lab Results  Component Value Date   INR 2.0 10/23/2012   INR 2.4 10/05/2012   INR 2.28* 10/03/2012    ANTI-COAG DOSE: Anticoagulation Dose Instructions as of 10/23/2012     [name] Tue Wed Thu Fri Sat   New Dose 5 mg 5 mg 5 mg 5 mg 5 mg 5 mg 5 mg       ANTICOAG SUMMARY: Anticoagulation Episode Summary   Current INR goal 2.0-3.0  Next INR check 10/30/2012  INR from last check 2.0 (10/23/2012)  Weekly max dose   Target end date Indefinite  [facility]  Preferred lab   Send INR reminders to    Indications  Left leg DVT [453.40] Left leg DVT (Resolved) [453.40]        Comments         ANTICOAG TODAY: Anticoagulation Summary as of 10/23/2012   INR goal 2.0-3.0  Selected INR 2.0 (10/23/2012)  Next INR check 10/30/2012  Target end date Indefinite   Indications  Left leg DVT [453.40] Left leg DVT (Resolved) [453.40]      Anticoagulation Episode Summary   [facility]   Preferred lab    Send INR reminders to    Comments       PATIENT INSTRUCTIONS: Patient Instructions  Patient instructed to take medications as defined in the Anti-coagulation Track section of this encounter.  Patient instructed to take today's dose.  Patient verbalized understanding of these instructions.  No more Lovenox shots necessary. You may discard any remaining injections.      FOLLOW-UP Return in 7 days (on 10/30/2012) for Follow up INR at 3PM.  Hulen Luster, III Pharm.D., CACP

## 2012-10-23 NOTE — Patient Instructions (Signed)
Patient instructed to take medications as defined in the Anti-coagulation Track section of this encounter.  Patient instructed to take today's dose.  Patient verbalized understanding of these instructions.  No more Lovenox shots necessary. You may discard any remaining injections.

## 2012-10-25 ENCOUNTER — Other Ambulatory Visit: Payer: Self-pay | Admitting: Internal Medicine

## 2012-10-25 MED ORDER — ESOMEPRAZOLE MAGNESIUM 40 MG PO CPDR: 40.0000 mg | DELAYED_RELEASE_CAPSULE | Freq: Every day | ORAL | Status: DC

## 2012-10-25 NOTE — Progress Notes (Signed)
Dr Dierdre Searles, I updated her med list by removing her Zegrid and adding Nexium 40. This was based on home health's med rec

## 2012-10-26 NOTE — Progress Notes (Signed)
Thanks, Dr. Rogelia Boga.

## 2012-10-30 ENCOUNTER — Ambulatory Visit (INDEPENDENT_AMBULATORY_CARE_PROVIDER_SITE_OTHER): Payer: Medicare Other | Admitting: Pharmacist

## 2012-10-30 DIAGNOSIS — Z7901 Long term (current) use of anticoagulants: Secondary | ICD-10-CM

## 2012-10-30 DIAGNOSIS — I82402 Acute embolism and thrombosis of unspecified deep veins of left lower extremity: Secondary | ICD-10-CM

## 2012-10-30 DIAGNOSIS — I82409 Acute embolism and thrombosis of unspecified deep veins of unspecified lower extremity: Secondary | ICD-10-CM

## 2012-10-30 NOTE — Patient Instructions (Signed)
Patient instructed to take medications as defined in the Anti-coagulation Track section of this encounter.  Patient instructed to take today's dose.  Patient verbalized understanding of these instructions.    

## 2012-10-30 NOTE — Progress Notes (Signed)
Anti-Coagulation Progress Note  Sherri Mcmillan is a 79 y.o. female who is currently on an anti-coagulation regimen.    RECENT RESULTS: Recent results are below, the most recent result is correlated with a dose of 35 mg. per week: Lab Results  Component Value Date   INR 3.40 10/30/2012   INR 2.0 10/23/2012   INR 2.4 10/05/2012    ANTI-COAG DOSE: Anticoagulation Dose Instructions as of 10/30/2012     [name] Tue Wed Thu Fri Sat   New Dose 5 mg 2.5 mg 5 mg 5 mg 5 mg 5 mg 5 mg       ANTICOAG SUMMARY: Anticoagulation Episode Summary   Current INR goal 2.0-3.0  Next INR check 11/13/2012  INR from last check 3.40! (10/30/2012)  Weekly max dose   Target end date Indefinite  [facility]  Preferred lab   Send INR reminders to    Indications  Left leg DVT [453.40] Left leg DVT (Resolved) [453.40]        Comments         ANTICOAG TODAY: Anticoagulation Summary as of 10/30/2012   INR goal 2.0-3.0  Selected INR 3.40! (10/30/2012)  Next INR check 11/13/2012  Target end date Indefinite   Indications  Left leg DVT [453.40] Left leg DVT (Resolved) [453.40]      Anticoagulation Episode Summary   [facility]   Preferred lab    Send INR reminders to    Comments       PATIENT INSTRUCTIONS: Patient Instructions  Patient instructed to take medications as defined in the Anti-coagulation Track section of this encounter.  Patient instructed to take today's dose.  Patient verbalized understanding of these instructions.       FOLLOW-UP Return in 2 weeks (on 11/13/2012) for Follow up INR at 1000h.  Hulen Luster, III Pharm.D., CACP

## 2012-11-02 ENCOUNTER — Telehealth: Payer: Self-pay | Admitting: *Deleted

## 2012-11-02 NOTE — Telephone Encounter (Signed)
Consuella Lose with [facility] [phone]  called to let clinic be aware today was last vistation with pt.  Meds are current with pt and INR is being done in clinic. Stanton Kidney  RN 11/02/12 2:45PM

## 2012-11-03 NOTE — Telephone Encounter (Signed)
Thanks for the updates.  Dr. Dierdre Searles

## 2012-11-10 ENCOUNTER — Other Ambulatory Visit (HOSPITAL_COMMUNITY): Payer: Self-pay | Admitting: Internal Medicine

## 2012-11-13 ENCOUNTER — Ambulatory Visit (INDEPENDENT_AMBULATORY_CARE_PROVIDER_SITE_OTHER): Payer: Medicare Other | Admitting: Pharmacist

## 2012-11-13 DIAGNOSIS — I82409 Acute embolism and thrombosis of unspecified deep veins of unspecified lower extremity: Secondary | ICD-10-CM

## 2012-11-13 DIAGNOSIS — I82402 Acute embolism and thrombosis of unspecified deep veins of left lower extremity: Secondary | ICD-10-CM

## 2012-11-13 LAB — POCT INR: INR: 2.6

## 2012-11-13 MED ORDER — WARFARIN SODIUM 5 MG PO TABS: ORAL_TABLET | ORAL | Status: DC

## 2012-11-13 NOTE — Patient Instructions (Signed)
Patient instructed to take medications as defined in the Anti-coagulation Track section of this encounter.  Patient instructed to take today's dose.  Patient verbalized understanding of these instructions.    

## 2012-11-13 NOTE — Progress Notes (Signed)
Anti-Coagulation Progress Note  Sherri Mcmillan is a 79 y.o. female who is currently on an anti-coagulation regimen.    RECENT RESULTS: Recent results are below, the most recent result is correlated with a dose of 32.5 mg. per week: Lab Results  Component Value Date   INR 2.60 11/13/2012   INR 3.40 10/30/2012   INR 2.0 10/23/2012    ANTI-COAG DOSE: Anticoagulation Dose Instructions as of 11/13/2012     [name] Tue Wed Thu Fri Sat   New Dose 5 mg 2.5 mg 5 mg 5 mg 5 mg 5 mg 5 mg       ANTICOAG SUMMARY: Anticoagulation Episode Summary   Current INR goal 2.0-3.0  Next INR check 12/11/2012  INR from last check 2.60 (11/13/2012)  Weekly max dose   Target end date Indefinite  [facility]  Preferred lab   Send INR reminders to    Indications  Left leg DVT [453.40] Left leg DVT (Resolved) [453.40]        Comments         ANTICOAG TODAY: Anticoagulation Summary as of 11/13/2012   INR goal 2.0-3.0  Selected INR 2.60 (11/13/2012)  Next INR check 12/11/2012  Target end date Indefinite   Indications  Left leg DVT [453.40] Left leg DVT (Resolved) [453.40]      Anticoagulation Episode Summary   [facility]R [facility]   Preferred lab    Send INR reminders to    Comments       PATIENT INSTRUCTIONS: Patient Instructions  Patient instructed to take medications as defined in the Anti-coagulation Track section of this encounter.  Patient instructed to take today's dose.  Patient verbalized understanding of these instructions.       FOLLOW-UP Return in 4 weeks (on 12/11/2012) for Follow up INR at 0915h.  Hulen Luster, III Pharm.D., CACP

## 2012-11-28 ENCOUNTER — Other Ambulatory Visit (HOSPITAL_COMMUNITY): Payer: Self-pay | Admitting: Internal Medicine

## 2012-11-30 NOTE — Telephone Encounter (Signed)
Called to pharm 

## 2012-12-02 ENCOUNTER — Emergency Department (HOSPITAL_COMMUNITY)
Admission: EM | Admit: 2012-12-02 | Discharge: 2012-12-02 | Disposition: A | Discharge status: Home / Self Care | Payer: Medicare Other | Attending: Emergency Medicine | Admitting: Emergency Medicine

## 2012-12-02 ENCOUNTER — Emergency Department (HOSPITAL_COMMUNITY): Payer: Medicare Other

## 2012-12-02 ENCOUNTER — Encounter (HOSPITAL_COMMUNITY): Payer: Self-pay | Admitting: Emergency Medicine

## 2012-12-02 DIAGNOSIS — Z79899 Other long term (current) drug therapy: Secondary | ICD-10-CM | POA: Insufficient documentation

## 2012-12-02 DIAGNOSIS — Z8709 Personal history of other diseases of the respiratory system: Secondary | ICD-10-CM | POA: Insufficient documentation

## 2012-12-02 DIAGNOSIS — F3289 Other specified depressive episodes: Secondary | ICD-10-CM | POA: Insufficient documentation

## 2012-12-02 DIAGNOSIS — W010XXA Fall on same level from slipping, tripping and stumbling without subsequent striking against object, initial encounter: Secondary | ICD-10-CM | POA: Insufficient documentation

## 2012-12-02 DIAGNOSIS — S8990XA Unspecified injury of unspecified lower leg, initial encounter: Secondary | ICD-10-CM | POA: Insufficient documentation

## 2012-12-02 DIAGNOSIS — G4700 Insomnia, unspecified: Secondary | ICD-10-CM | POA: Insufficient documentation

## 2012-12-02 DIAGNOSIS — Z8669 Personal history of other diseases of the nervous system and sense organs: Secondary | ICD-10-CM | POA: Insufficient documentation

## 2012-12-02 DIAGNOSIS — G8929 Other chronic pain: Secondary | ICD-10-CM | POA: Insufficient documentation

## 2012-12-02 DIAGNOSIS — Z87891 Personal history of nicotine dependence: Secondary | ICD-10-CM | POA: Insufficient documentation

## 2012-12-02 DIAGNOSIS — F411 Generalized anxiety disorder: Secondary | ICD-10-CM | POA: Insufficient documentation

## 2012-12-02 DIAGNOSIS — D689 Coagulation defect, unspecified: Secondary | ICD-10-CM | POA: Insufficient documentation

## 2012-12-02 DIAGNOSIS — Y9301 Activity, walking, marching and hiking: Secondary | ICD-10-CM | POA: Insufficient documentation

## 2012-12-02 DIAGNOSIS — H269 Unspecified cataract: Secondary | ICD-10-CM | POA: Insufficient documentation

## 2012-12-02 DIAGNOSIS — I10 Essential (primary) hypertension: Secondary | ICD-10-CM | POA: Insufficient documentation

## 2012-12-02 DIAGNOSIS — W19XXXA Unspecified fall, initial encounter: Secondary | ICD-10-CM

## 2012-12-02 DIAGNOSIS — Z8639 Personal history of other endocrine, nutritional and metabolic disease: Secondary | ICD-10-CM | POA: Insufficient documentation

## 2012-12-02 DIAGNOSIS — R042 Hemoptysis: Secondary | ICD-10-CM

## 2012-12-02 DIAGNOSIS — S0003XA Contusion of scalp, initial encounter: Secondary | ICD-10-CM | POA: Insufficient documentation

## 2012-12-02 DIAGNOSIS — Z8679 Personal history of other diseases of the circulatory system: Secondary | ICD-10-CM | POA: Insufficient documentation

## 2012-12-02 DIAGNOSIS — Z862 Personal history of diseases of the blood and blood-forming organs and certain disorders involving the immune mechanism: Secondary | ICD-10-CM | POA: Insufficient documentation

## 2012-12-02 DIAGNOSIS — Z86718 Personal history of other venous thrombosis and embolism: Secondary | ICD-10-CM | POA: Insufficient documentation

## 2012-12-02 DIAGNOSIS — F329 Major depressive disorder, single episode, unspecified: Secondary | ICD-10-CM | POA: Insufficient documentation

## 2012-12-02 DIAGNOSIS — Y9289 Other specified places as the place of occurrence of the external cause: Secondary | ICD-10-CM | POA: Insufficient documentation

## 2012-12-02 DIAGNOSIS — S1093XA Contusion of unspecified part of neck, initial encounter: Secondary | ICD-10-CM | POA: Insufficient documentation

## 2012-12-02 DIAGNOSIS — S0990XA Unspecified injury of head, initial encounter: Secondary | ICD-10-CM

## 2012-12-02 DIAGNOSIS — T148XXA Other injury of unspecified body region, initial encounter: Secondary | ICD-10-CM

## 2012-12-02 DIAGNOSIS — Z7901 Long term (current) use of anticoagulants: Secondary | ICD-10-CM | POA: Insufficient documentation

## 2012-12-02 DIAGNOSIS — W1809XA Striking against other object with subsequent fall, initial encounter: Secondary | ICD-10-CM | POA: Insufficient documentation

## 2012-12-02 DIAGNOSIS — Z8719 Personal history of other diseases of the digestive system: Secondary | ICD-10-CM | POA: Insufficient documentation

## 2012-12-02 LAB — CBC WITH DIFFERENTIAL/PLATELET
Basophils Absolute: 0 10*3/uL (ref 0.0–0.1)
Basophils Relative: 1 % (ref 0–1)
Eosinophils Absolute: 0.1 10*3/uL (ref 0.0–0.7)
Eosinophils Relative: 1 % (ref 0–5)
HCT: 30.5 % — ABNORMAL LOW (ref 36.0–46.0)
Lymphocytes Relative: 40 % (ref 12–46)
MCH: 29.3 pg (ref 26.0–34.0)
MCHC: 33.8 g/dL (ref 30.0–36.0)
MCV: 86.6 fL (ref 78.0–100.0)
Monocytes Absolute: 0.3 10*3/uL (ref 0.1–1.0)
Platelets: 174 10*3/uL (ref 150–400)
RDW: 13.3 % (ref 11.5–15.5)

## 2012-12-02 LAB — COMPREHENSIVE METABOLIC PANEL
ALT: 10 U/L (ref 0–35)
AST: 19 U/L (ref 0–37)
Albumin: 3.7 g/dL (ref 3.5–5.2)
Alkaline Phosphatase: 82 U/L (ref 39–117)
BUN: 24 mg/dL — ABNORMAL HIGH (ref 6–23)
Chloride: 101 mEq/L (ref 96–112)
Potassium: 3.5 mEq/L (ref 3.5–5.1)
Sodium: 140 mEq/L (ref 135–145)
Total Bilirubin: 0.2 mg/dL — ABNORMAL LOW (ref 0.3–1.2)
Total Protein: 7.1 g/dL (ref 6.0–8.3)

## 2012-12-02 LAB — PROTIME-INR: Prothrombin Time: 42.6 seconds — ABNORMAL HIGH (ref 11.6–15.2)

## 2012-12-02 MED ORDER — HYDROCODONE-ACETAMINOPHEN 5-325 MG PO TABS
1.0000 | ORAL_TABLET | Freq: Once | ORAL | Status: AC
  Administered 2012-12-02: 1 via ORAL
  Filled 2012-12-02: qty 1

## 2012-12-02 MED ORDER — HYDROCODONE-ACETAMINOPHEN 5-325 MG PO TABS: 1.0000 | ORAL_TABLET | ORAL | Status: DC | PRN

## 2012-12-02 NOTE — ED Notes (Signed)
Pt c/o fall x 2 days ago onto knee and hemoptysis x 4 days; pt on coumadin

## 2012-12-02 NOTE — ED Provider Notes (Signed)
TIME SEEN: 1:55 PM  CHIEF COMPLAINT: "Spitting up blood", fall 4 days ago  HPI: Patient is a 79 y.o. African American female with a history of hypertension and recent diagnosis of lower extremity DVT on Coumadin, Barrett's esophagitis, aortic stenosis who presents to the emergency department with 2 complaints. Patient reports that 4 days ago she tripped while walking outside landing on her knees and hit the side of her face. She states that she's been able to angulate since does have mild bilateral knee pain and headache. She also reports that for the past 2-3 days she has been spitting up blood. She denies that she has been coughing up blood. She denies any chest pain or shortness of breath. She states that she only spits up blood at night. She states that it is a small amount in her saliva. She does not notice it is coming from her teeth. She denies any sore throat. She denies any bloody stool or melena.  ROS: See HPI Constitutional: no fever  Eyes: no drainage  ENT: no runny nose   Cardiovascular:  no chest pain  Resp: no SOB  GI: no vomiting GU: no dysuria Integumentary: no rash  Allergy: no hives  Musculoskeletal: no leg swelling  Neurological: no slurred speech ROS otherwise negative  PAST MEDICAL HISTORY/PAST SURGICAL HISTORY:  Past Medical History  Diagnosis Date  . Chronic pain     DJD knees, back, migranes, LLE varicose veins, and LLE neuropathy.   . Aortic stenosis     Dx ECHO 2008 , mild and aymptomatic , needs ECHO q 3-5 yrs  . Barrett's esophagus     Demonstrated on EGD 12/2010. EGD 09/17/2012 shows inflamed GE junctional mucosa without metaplasia, dysplasia, or malignancy.  . Chronic insomnia   . Chronic anxiety     Admission to [facility] (90s or early 2000)  for 6 weeks after mother died. Complicated again by death of her sister 2010. 2012 developed hallucinations and I refused to refill controlled meds unless she see psych which she is not agreable to  . Elevated cholesterol  10/11    LDL 155. Her 10 year risk (decreasing her age to 82 as the calculator won't go to age 63) is 21% ish. If consider her non smoker (which I wouldn't even though she says 1 pak lasts 2 weeks) risk is 12% ish. So goal for LDL is 130 or 100.  Marland Kitchen HTN (hypertension)     Controlled with 2 drug therapy  . Migraine   . Cataract   . Depression   . Gastroparesis     Demonstrated on GES 12/2010 by Dr Dalene Seltzer  . Bronchitis     hx of  . Shortness of breath   . Left leg DVT 09/28/2012    MEDICATIONS:  Prior to Admission medications   Medication Sig Start Date End Date Taking? Authorizing Provider  amLODipine (NORVASC) 10 MG tablet Take 1 tablet (10 mg total) by mouth daily. 09/28/12  Yes Na Li, MD  citalopram (CELEXA) 40 MG tablet Take 40 mg by mouth daily.   Yes Historical Provider, MD  esomeprazole (NEXIUM) 40 MG capsule Take 1 capsule (40 mg total) by mouth daily. 10/25/12 10/25/13 Yes Burns Spain, MD  furosemide (LASIX) 40 MG tablet Take 1.5 tablets (60 mg total) by mouth daily. 09/28/12  Yes Na Li, MD  promethazine (PHENERGAN) 12.5 MG tablet Take 1 tablet (12.5 mg total) by mouth every 6 (six) hours as needed for nausea. 09/28/12  Yes Na  Dierdre Searles, MD  sucralfate (CARAFATE) 1 G tablet Take 1 tablet (1 g total) by mouth 4 (four) times daily. 09/28/12 09/28/13 Yes Na Li, MD  traMADol (ULTRAM) 50 MG tablet Take 50 mg by mouth every 6 (six) hours as needed for pain.   Yes Historical Provider, MD  warfarin (COUMADIN) 5 MG tablet Take 2.5-5 mg by mouth daily. Mondays only 0.5 half tab (2.5 mg total); All other days 1 tab daily   Yes Historical Provider, MD    ALLERGIES:  Allergies  Allergen Reactions  . Milk-Related Compounds   . Aspirin Rash    SOCIAL HISTORY:  History  Substance Use Topics  . Smoking status: Former Smoker    Types: Cigarettes    Quit date: 06/01/2010  . Smokeless tobacco: Never Used  . Alcohol Use: No    FAMILY HISTORY: Family History  Problem Relation Age of Onset  . Stroke  Neg Hx   . Cancer Neg Hx   . Colon cancer Neg Hx   . Anesthesia problems Neg Hx   . Hypotension Neg Hx   . Malignant hyperthermia Neg Hx   . Pseudochol deficiency Neg Hx   . Heart disease Mother   . Hypertension Mother     EXAM: BP 120/70  Pulse 77  Temp(Src) 98 F (36.7 C) (Oral)  Resp 18  SpO2 96%  LMP 04/26/1950 CONSTITUTIONAL: Alert and oriented and responds appropriately to questions. Well-appearing; well-nourished, smiling, nontoxic HEAD: Normocephalic EYES: Conjunctivae clear, PERRL, no conjunctival pallor ENT: normal nose; no rhinorrhea; moist mucous membranes; pharynx without lesions noted, no dental injury, no bleeding from her gums, no epistaxis, no blood in her posterior oropharynx NECK: Supple, no meningismus, no LAD  CARD: RRR; S1 and S2 appreciated; systolic murmur consistent with aortic stenosis, no clicks, no rubs, no gallops RESP: Normal chest excursion without splinting or tachypnea; breath sounds clear and equal bilaterally; no wheezes, no rhonchi, no rales,  ABD/GI: Normal bowel sounds; non-distended; soft, non-tender, no rebound, no guarding BACK:  The back appears normal and is non-tender to palpation, there is no CVA tenderness EXT:. Mild tenderness to palpation over her patella's bilaterally with no deformity, full range of motion in her joints, no ligamentous laxity, 2+ DP pulses bilaterally, no joint effusion, Normal ROM in all joints; non-tender to palpation; no edema; normal capillary refill; no cyanosis    SKIN: Normal color for age and race; warm NEURO: Moves all extremities equally, sensation to light touch intact diffusely, cranial nerves II through XII grossly intact, strength 5/5 in all 4 extremities, normal gait PSYCH: The patient's mood and manner are appropriate. Grooming and personal hygiene are appropriate.  MEDICAL DECISION MAKING: Patient with mechanical fall 4 days ago. She reports she did strike her head. She denies any chest pain,  shortness of breath, palpitations or dizziness that caused her fall. She is complaining of bilateral knee pain with no obvious injury and has been ambulating for the past 4 days without difficulty. Discussed with patient I feel fractures are unlikely and she agrees and does not want x-rays of her knees today. We'll however obtain a CT of her head given that she is on Coumadin and states she did hit the right side of her face on the ground. She denies any loss of consciousness. She has no neurologic deficit. Patient also states she has been spitting up blood for the past several days. She denies that this is hemoptysis. No chest pain or shortness of breath to suggest pulmonary embolus,  no cough or fever to suggest bronchitis or pneumonia. Patient has no bleeding currently. Will obtain basic labs including CBC and INR.  ED PROGRESS: Head CT, chest x-ray unremarkable. Hemoglobin is stable. Patient has been hemodynamically stable and still has no further bleeding. Her INR is 4.7. Have instructed her to hold her dose this evening and Sunday night. We'll have her resume Coumadin Monday evening and followup with her primary care doctor to have her INR rechecked. Given customary and usual return precautions regarding bleeding. Low suspicion for delayed intracranial hemorrhage given the CT head today was negative, patient is neurologically intact her fall was 4 days ago. We'll discharge her with some medication for pain control for her knees and headache. Patient verbalizes understanding and is comfortable at this plan.    Layla Maw , DO 12/02/12 [phone]

## 2012-12-04 ENCOUNTER — Ambulatory Visit (INDEPENDENT_AMBULATORY_CARE_PROVIDER_SITE_OTHER): Payer: Medicare Other | Admitting: Internal Medicine

## 2012-12-04 VITALS — BP 106/69 | HR 81 | Temp 98.4°F | Ht 60.0 in | Wt 133.7 lb

## 2012-12-04 DIAGNOSIS — I10 Essential (primary) hypertension: Secondary | ICD-10-CM

## 2012-12-04 DIAGNOSIS — K922 Gastrointestinal hemorrhage, unspecified: Secondary | ICD-10-CM

## 2012-12-04 DIAGNOSIS — K219 Gastro-esophageal reflux disease without esophagitis: Secondary | ICD-10-CM

## 2012-12-04 DIAGNOSIS — R791 Abnormal coagulation profile: Secondary | ICD-10-CM

## 2012-12-04 LAB — CBC
HCT: 28.7 % — ABNORMAL LOW (ref 36.0–46.0)
Hemoglobin: 9.4 g/dL — ABNORMAL LOW (ref 12.0–15.0)
MCH: 28.1 pg (ref 26.0–34.0)
MCHC: 32.8 g/dL (ref 30.0–36.0)
MCV: 85.9 fL (ref 78.0–100.0)
Platelets: 196 10*3/uL (ref 150–400)

## 2012-12-04 LAB — POCT INR: INR: 4.2

## 2012-12-04 NOTE — Progress Notes (Signed)
This is a Psychologist, occupational Note.  The care of the patient was discussed with Dr. Rogelia Boga and the assessment and plan was formulated with their assistance.  Please see their note for official documentation of the patient encounter.   Subjective:   Patient ID: Sherri Mcmillan female   DOB: 16-Jun-1932 79 y.o.   MRN: [id number]  CC: "I am concerned because I am spitting up blood since last Tuesday."  HPI: Sherri Mcmillan is a 79 y.o. w/ PMH significant for GERD, Barrett's Esophagus, Esophageal Strictures, Reflux esophagitis, HTN, Aortic Stenosis and a history of DVT who presents to clinic for evaluation of spitting up blood. She reports that she fell while walking on Tuesday August 5th, but did not report to the emergency room immediately because she felt ok. She denies LOC, external bleeding or significant pain. She reports that she had a mild headache and bruises on both knees but denies other symptoms at that time. That evening she noticed that she was spitting out a small amount of blood in the sink after she laid down. She describes the amount of blood in the sink as a nickel sized amount that was dark red in color. She did not notice bleeding or blood in her saliva when she brushed her teeth earlier that evening. She reports that she continue to spit blood every 1/2-1 hour through out the night and that it didn't stop until she got out of bed the following morning. The bleeding continued in a similar fashion each night and continued to improve each day after she stood up from bed.   On Saturday she experienced a BM that she described as dark green/black. The stool was normal consistency and described as solid without notice of unusual smell. She denies changes in her BM's, but reports that it scared her and so she elected to go to the Emergency Department at [facility] to be evaluated. She was evaluated for her fall and for any cranial bleed. A head CT was completed and was unremarkable. BMP,  CBC and Coag's were also obtained revealing an elevated BUN (24) and a reduced Hgb 10.3 (down from 11.7 on 6/8) and Hct 30.5 (down from 34.7 on 6/8). Her INR was noticeable elevated 4.73 (up from 2.6 on 7/21). She was discharged in stable condition with vicodin for pain management of headaches and bruised knees and advised to follow up with her PCP regarding her coumadin management.     She denies recent use of antibiotics or new medications. She also reports occasional light headedness when she strains to use the restroom. She denies N/V, hemoptysis, fever, chills or other constitutional symptoms. She is schedule for upper GI procedure in late August for treatment of esophageal stricture with Dr. Arlyce Dice and Nedra Hai at [facility]. She is followed by Dr. Alexandria Lodge for her coumadin therapy.     Past Medical History  Diagnosis Date   Chronic pain     DJD knees, back, migranes, LLE varicose veins, and LLE neuropathy.    Aortic stenosis     Dx ECHO 2008 , mild and aymptomatic , needs ECHO q 3-5 yrs   Barrett's esophagus     Demonstrated on EGD 12/2010. EGD 09/17/2012 shows inflamed GE junctional mucosa without metaplasia, dysplasia, or malignancy.   Chronic insomnia    Chronic anxiety     Admission to [facility] (90s or early 2000)  for 6 weeks after mother died. Complicated again by death of her sister 2010. 2012 developed hallucinations and  I refused to refill controlled meds unless she see psych which she is not agreable to   Elevated cholesterol 10/11    LDL 155. Her 10 year risk (decreasing her age to 49 as the calculator won't go to age 61) is 21% ish. If consider her non smoker (which I wouldn't even though she says 1 pak lasts 2 weeks) risk is 12% ish. So goal for LDL is 130 or 100.   HTN (hypertension)     Controlled with 2 drug therapy   Migraine    Cataract    Depression    Gastroparesis     Demonstrated on GES 12/2010 by Dr Dalene Seltzer   Bronchitis     hx of   Shortness of breath      Left leg DVT 09/28/2012   Current Outpatient Prescriptions  Medication Sig Dispense Refill   amLODipine (NORVASC) 10 MG tablet Take 1 tablet (10 mg total) by mouth daily.  90 tablet  4   citalopram (CELEXA) 40 MG tablet Take 40 mg by mouth daily.       esomeprazole (NEXIUM) 40 MG capsule Take 1 capsule (40 mg total) by mouth daily.  30 capsule  1   furosemide (LASIX) 40 MG tablet Take 1.5 tablets (60 mg total) by mouth daily.  135 tablet  4   HYDROcodone-acetaminophen (NORCO/VICODIN) 5-325 MG per tablet Take 1 tablet by mouth every 4 (four) hours as needed for pain.  15 tablet  0   promethazine (PHENERGAN) 12.5 MG tablet Take 1 tablet (12.5 mg total) by mouth every 6 (six) hours as needed for nausea.  30 tablet  1   sucralfate (CARAFATE) 1 G tablet Take 1 tablet (1 g total) by mouth 4 (four) times daily.  120 tablet  4   traMADol (ULTRAM) 50 MG tablet Take 50 mg by mouth every 6 (six) hours as needed for pain.       warfarin (COUMADIN) 5 MG tablet Take 2.5-5 mg by mouth daily. Mondays only 0.5 half tab (2.5 mg total); All other days 1 tab daily       No current facility-administered medications for this visit.   Family History  Problem Relation Age of Onset   Stroke Neg Hx    Cancer Neg Hx    Colon cancer Neg Hx    Anesthesia problems Neg Hx    Hypotension Neg Hx    Malignant hyperthermia Neg Hx    Pseudochol deficiency Neg Hx    Heart disease Mother    Hypertension Mother    History   Social History   Marital Status: Single    Spouse Name: N/A    Number of Children: N/A   Years of Education: N/A   Social History Main Topics   Smoking status: Former Smoker    Types: Cigarettes    Quit date: 06/01/2010   Smokeless tobacco: Never Used   Alcohol Use: No   Drug Use: No   Sexually Active: No   Other Topics Concern   None   Social History Narrative   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  Respiratory: positive for hemoptysis Cardiovascular: positive for lower extremity edema Gastrointestinal: positive for melena, negative for abdominal pain, change in bowel habits and vomiting Genitourinary:positive for dysuria Musculoskeletal:negative for muscle weakness and myalgias Neurological: positive for headaches and dizzyness with stained urination and BMs. Endocrine: negative  Objective:  Physical Exam: Filed Vitals:   12/04/12 0835  BP: 106/69  Pulse: 81  Temp: 98.4 F (36.9 C)  TempSrc: Oral  Height: 5' (1.524 m)  Weight: 133 lb 11.2 oz (60.646 kg)  SpO2: 100%   General appearance: alert, cooperative and no distress Head: Normocephalic, without obvious abnormality, atraumatic Eyes: positive findings: pupillary abnormality: left pupil s/p catarracts repair.  Ears: Normal external ears and hearing without signs of trauma.  Nose: Nares normal. Septum midline. Mucosa normal. No drainage or sinus tenderness. Throat: lips, mucosa, and tongue normal; teeth and gums normal Lungs: Course breath sounds bilaterally.  Heart: systolic murmur: holosystolic 2/6, low pitch at 2nd right intercostal space Abdomen: soft, non-tender; bowel sounds normal; no masses,  no organomegaly Extremities: edema mild edema (1+ pitting edema) and varicose veins noted Neurologic: Alert and oriented X 3, normal strength and tone. Normal symmetric reflexes. Normal coordination and gait Cranial nerves: normal Rectal  Exam: Denied.   Assessment & Plan:  1. Repeat INR and CBC obtained today reveals continued elevated INR (4.2) indicating likely slow bleed from upper GI. Bleed likely 2/2 to previously noted ulcers, strictures or hiatal hernia on endoscopy performed 09/17/12. A colonoscopy was completed on 08/2010 with unremarkable findings further leading to suspected upper GI slow bleed. Patient return to clinic for updated INR on Wednesday 8/13. Patient will then follow up with Dr. Alexandria Lodge who is managing her Coumadin therapy.    2. Plan to continue holding Coumadin Therapy until Wednesday 8/13.   3. Plan to adjust Coumadin dosage to below when she resumes Wednesday 8/13: Monday- 2.5mg  Tuesday- 5mg  Wednesday- 5mg  Thursday-2.5mg  Friday-5mg  Saturday-5mg  Sunday-5mg  (Total- 30mg /week)  4. Stressed the importance of continuing Nexium as prescribed.

## 2012-12-04 NOTE — Patient Instructions (Addendum)
It was a pleasure seeing you this morning. - Your INR is still elevated. Hold your Coumadin therapy until Wednesday. Return to our clinic on Wednesday or Thursday to have your INR checked again.  - New Coumadin Regiment: Monday and Thursday- 2.5mg  (1/2 pill), Tuesday, Wednesday, Friday, Saturday and Sunday- 5mg  (1 pill) - Make sure to take your Nexium prescription as prescribed. - Please call our clinic if you notice any substantial increase in bleeding. - Follow up with Dr. Arlyce Dice at your scheduled appointment in August.

## 2012-12-06 ENCOUNTER — Other Ambulatory Visit: Payer: Self-pay | Admitting: Internal Medicine

## 2012-12-06 ENCOUNTER — Other Ambulatory Visit (INDEPENDENT_AMBULATORY_CARE_PROVIDER_SITE_OTHER): Payer: Medicare Other

## 2012-12-06 DIAGNOSIS — D62 Acute posthemorrhagic anemia: Secondary | ICD-10-CM

## 2012-12-06 LAB — CBC
Hemoglobin: 7.9 g/dL — ABNORMAL LOW (ref 12.0–15.0)
MCH: 28.9 pg (ref 26.0–34.0)
MCHC: 33.3 g/dL (ref 30.0–36.0)
MCV: 86.8 fL (ref 78.0–100.0)
Platelets: 189 10*3/uL (ref 150–400)
RBC: 2.73 MIL/uL — ABNORMAL LOW (ref 3.87–5.11)
RDW: 14.4 % (ref 11.5–15.5)

## 2012-12-06 NOTE — Patient Instructions (Addendum)
New Coumadin regimen : Monday and Thursday- 2.5mg  (1/2 pill), Tuesday, Wednesday, Friday, Saturday and Sunday- 5mg  (1 pill)

## 2012-12-07 ENCOUNTER — Telehealth: Payer: Self-pay | Admitting: Gastroenterology

## 2012-12-07 NOTE — Telephone Encounter (Signed)
Left message for pt to call back  °

## 2012-12-08 ENCOUNTER — Telehealth: Payer: Self-pay | Admitting: *Deleted

## 2012-12-08 NOTE — Telephone Encounter (Signed)
Thank you :)

## 2012-12-08 NOTE — Telephone Encounter (Signed)
Pt scheduled to see Doug Sou PA 12/12/12 @10am . Unable to get in touch with pt. Spoke with the nurse, Dondra Spry, at the internal medicine clinic and she will notify pt of appt when she comes Monday to the coumadin clinic.

## 2012-12-08 NOTE — Telephone Encounter (Signed)
Received a call from [place], [place] @  Dr. Marzetta Board office [facility] ).  She has been trying to reach the patient in response to a referral from [place]. Two messages have been left with pt and no return call.  Appointment scheduled for Tuesday 8/19  @ 1000 with PA Shanda Bumps I will give pt the appointment on Monday 8/18 if she does not call back.

## 2012-12-11 ENCOUNTER — Ambulatory Visit (INDEPENDENT_AMBULATORY_CARE_PROVIDER_SITE_OTHER): Payer: Medicare Other | Admitting: Pharmacist

## 2012-12-11 DIAGNOSIS — D6832 Hemorrhagic disorder due to extrinsic circulating anticoagulants: Secondary | ICD-10-CM | POA: Insufficient documentation

## 2012-12-11 DIAGNOSIS — I82409 Acute embolism and thrombosis of unspecified deep veins of unspecified lower extremity: Secondary | ICD-10-CM

## 2012-12-11 DIAGNOSIS — I82402 Acute embolism and thrombosis of unspecified deep veins of left lower extremity: Secondary | ICD-10-CM

## 2012-12-11 NOTE — Patient Instructions (Signed)
Patient instructed to take medications as defined in the Anti-coagulation Track section of this encounter.  Patient instructed to take today's dose.  Patient verbalized understanding of these instructions.    

## 2012-12-11 NOTE — Assessment & Plan Note (Signed)
She cont to take her PPI.

## 2012-12-11 NOTE — Progress Notes (Signed)
Anti-Coagulation Progress Note  Sherri Mcmillan is a 79 y.o. female who is currently on an anti-coagulation regimen.    RECENT RESULTS: Recent results are below, the most recent result is correlated with a dose of 30 mg. per week: Lab Results  Component Value Date   INR 2.10 12/11/2012   INR 1.7 12/06/2012   INR 4.2 12/04/2012    ANTI-COAG DOSE: Anticoagulation Dose Instructions as of 12/11/2012     [name] Tue Wed Thu Fri Sat   New Dose 5 mg 5 mg 2.5 mg 5 mg 5 mg 2.5 mg 5 mg       ANTICOAG SUMMARY: Anticoagulation Episode Summary   Current INR goal 2.0-3.0  Next INR check 12/18/2012  INR from last check 2.10 (12/11/2012)  Weekly max dose   Target end date Indefinite  [facility]  Preferred lab   Send INR reminders to    Indications  Left leg DVT [453.40] Left leg DVT (Resolved) [453.40]        Comments         ANTICOAG TODAY: Anticoagulation Summary as of 12/11/2012   INR goal 2.0-3.0  Selected INR 2.10 (12/11/2012)  Next INR check 12/18/2012  Target end date Indefinite   Indications  Left leg DVT [453.40] Left leg DVT (Resolved) [453.40]      Anticoagulation Episode Summary   INR check [facility]   Preferred lab    Send INR reminders to    Comments       PATIENT INSTRUCTIONS: Patient Instructions  Patient instructed to take medications as defined in the Anti-coagulation Track section of this encounter.  Patient instructed to take today's dose.  Patient verbalized understanding of these instructions.      FOLLOW-UP Return in 7 days (on 12/18/2012) for Follow up INR at 0900h.  Hulen Luster, III Pharm.D., CACP

## 2012-12-11 NOTE — Assessment & Plan Note (Signed)
The pt's INR was elevated for an unknown reason. Her warfarin has been held and repeated INR today is 4.2. Her goal is 2 - 3. Therefore, will cont to hold her warfarin and ask that she returns to the clinic in 2 days to repeat the INR. Will also check her CBC today since she appears to have ongoing slight blood loss. She is otherwise asymptomatic in terms of blood loss anemia. The pt states that she has an appt with her GI physician for F/U of her Gi issues.

## 2012-12-11 NOTE — Progress Notes (Signed)
  Subjective:    Patient ID: Sherri Mcmillan, female    DOB: 21-Mar-1933, 79 y.o.   MRN: [id number]  HPI  Sherri Mcmillan is well known to me. She is on wafarin for a recent DVT. 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 She scrapped her knees B and also scrapped her face. She sat on the ground, developed a HA, but otherwise felt OK and did not go to the ED. That evening, she spit up a small amount of blood. This occurred only after she had gone to bed and she states that she had to get up every 1/2 hr to every hour to spit up a small amount of blood into the sink. She had no other bleeding with brushing her teeth. Once she got up that AM, she had no further bleeding. She also states she had one dark stool. This scared her and she went to the ED and was found to have an INR of 4.73. Her other labs were normal for her. She also had a head CT that was normal. The Er asked her to hold her warfarin in the 9th and 10th and to see her PCP on the 11st so she is here today for eval. She denies brusing, hematuria, recent ABX use, recent med change, BRBPR, weakness, lightheadedness.   Review of Systems  Constitutional: Negative for fatigue and unexpected weight change.  HENT: Negative for nosebleeds, mouth sores and trouble swallowing.   Respiratory: Negative for shortness of breath.   Cardiovascular: Negative for chest pain.  Gastrointestinal: Positive for blood in stool.  Genitourinary: Negative for hematuria and vaginal bleeding.  Musculoskeletal: Negative for gait problem.  Skin: Positive for wound.  Neurological: Negative for dizziness and headaches.  Hematological: Does not bruise/bleed easily.       Objective:   Physical Exam  Constitutional: She is oriented to person, place, and time. She appears well-developed and well-nourished. No distress.  HENT:  Head: Normocephalic and atraumatic.  Right Ear: External ear normal.   Left Ear: External ear normal.  Nose: Nose normal.  Mouth/Throat: Oropharynx is clear and moist. No oropharyngeal exudate.  Eyes: Conjunctivae and EOM are normal. Right eye exhibits no discharge. Left eye exhibits no discharge. No scleral icterus.  Cardiovascular: Normal rate, regular rhythm and normal heart sounds.   Pulmonary/Chest: Effort normal and breath sounds normal.  Abdominal:  Refused rectal for hemecult  Musculoskeletal: Normal range of motion. She exhibits no edema and no tenderness.  Neurological: She is alert and oriented to person, place, and time.  Skin: Skin is warm and dry. She is not diaphoretic.  Psychiatric: She has a normal mood and affect. Her behavior is normal. Judgment and thought content normal.          Assessment & Plan:

## 2012-12-11 NOTE — Assessment & Plan Note (Signed)
Her BP is well controlled 

## 2012-12-12 ENCOUNTER — Encounter: Payer: Self-pay | Admitting: Gastroenterology

## 2012-12-12 ENCOUNTER — Encounter: Payer: Self-pay | Admitting: Internal Medicine

## 2012-12-12 ENCOUNTER — Ambulatory Visit (INDEPENDENT_AMBULATORY_CARE_PROVIDER_SITE_OTHER): Payer: Medicare Other | Admitting: Gastroenterology

## 2012-12-12 ENCOUNTER — Telehealth: Payer: Self-pay | Admitting: *Deleted

## 2012-12-12 ENCOUNTER — Telehealth: Payer: Self-pay

## 2012-12-12 ENCOUNTER — Other Ambulatory Visit (INDEPENDENT_AMBULATORY_CARE_PROVIDER_SITE_OTHER): Payer: Medicare Other

## 2012-12-12 VITALS — BP 110/60 | HR 76 | Ht 60.0 in | Wt 133.0 lb

## 2012-12-12 DIAGNOSIS — K219 Gastro-esophageal reflux disease without esophagitis: Secondary | ICD-10-CM

## 2012-12-12 DIAGNOSIS — R042 Hemoptysis: Secondary | ICD-10-CM

## 2012-12-12 DIAGNOSIS — D649 Anemia, unspecified: Secondary | ICD-10-CM

## 2012-12-12 LAB — CBC
HCT: 19.8 % — CL (ref 36.0–46.0)
MCHC: 33.2 g/dL (ref 30.0–36.0)
MCV: 87.1 fl (ref 78.0–100.0)
Platelets: 242 10*3/uL (ref 150.0–400.0)
RBC: 2.27 Mil/uL — ABNORMAL LOW (ref 3.87–5.11)

## 2012-12-12 MED ORDER — OMEPRAZOLE-SODIUM BICARBONATE 40-1100 MG PO CAPS: 1.0000 | ORAL_CAPSULE | Freq: Every day | ORAL | Status: DC

## 2012-12-12 NOTE — Telephone Encounter (Signed)
Call from [place], with [facility] - [phone] Pt was seen today and they are asking for a call back.  I talked with Bonita Quin and CBC was done hgb 6.6  Pt referred back to PCP with recommendation of blood transfusion and  Non enhanced CT of   abd and pelvis  for retroperitoneal bleed as pt fell last week and is on coumadin. Pt seen by Doug Sou, PA at [facility]

## 2012-12-12 NOTE — Telephone Encounter (Signed)
Message copied by Chrystie Nose on Tue Dec 12, 2012  3:09 PM ------      Message from: Doug Sou D      Created: Tue Dec 12, 2012  2:13 PM       Lab called with Hgb of 6.6 grams on her.  It doesn't necessarily sound like she is GIB'ing.  I discussed with Dr. Leone Payor.  He agrees to contact patient's PCP's office to see if they will set her up with transfusion and order non-enhanced CT of the abdomen and pelvis to look for retroperitoneal bleed since she had a fall last week and is on coumadin.             Thank you,            Jess ------

## 2012-12-12 NOTE — Telephone Encounter (Signed)
Pt called and advised ED visit for evaluation now.  She voices understanding and will try and get a ride

## 2012-12-12 NOTE — Telephone Encounter (Signed)
I would advise the patient to go to the ER asap and get evaluated.

## 2012-12-12 NOTE — Telephone Encounter (Signed)
Spoke with IM clinic and let them know she needed the transfusion and a CT ordered. See note below.

## 2012-12-12 NOTE — Progress Notes (Signed)
12/12/2012 Sherri Mcmillan [id number] Jul 28, 1932   History of Present Illness:  Patient is a 79 year old female who is a patient of Dr. Marzetta Board and follows with him for her reflux issues.  Just had an EGD in May of this year, which revealed a stricture at the GE junction, esophagitis c/w reflux, and a 4 cm hiatal hernia.  She is currently on Nexium 40 mg daily and carafate pills four times a day for her acid reflux and feels like her reflux is doing well overall.  At her last visit in June she was prescribed Zegerid, however, the pharmacy did not have it at that time so she continued on the Nexium instead.  She comes in today stating that she has been spitting up blood for the past two weeks.  She is not coughing or vomiting and does not feel like she is having significant reflux.  She was in the ED last week after she suffered a fall and her Hgb was low at 7.9 grams.  It was previously in the low 11 gram range two months ago.  She denies bloody stools.  Says that they were dark green for a little white, but have lightened up again.  She declined rectal exam in the ED and at PCP 's office.  She is on coumadin since June because of LLE DVT's, which are still present.  She denies dizziness, weakness, increased fatigue.  Last colonoscopy was 08/2010, which was normal.  Current Medications, Allergies, Past Medical History, Past Surgical History, Family History and Social History were reviewed in [employer] record.   Physical Exam: BP 110/60  Pulse 76  Ht 5' (1.524 m)  Wt 133 lb (60.328 kg)  BMI 25.97 kg/m2  SpO2 91%  LMP 04/26/1950 General: Well developed, black female in no acute distress Head: Normocephalic and atraumatic Eyes:  sclerae anicteric, conjunctiva pink  Ears: Normal auditory acuity Mouth:  MMM.  No obvious lesions or bleeding seen. Lungs: Some mild wheezing heard in B/L lung fields. Heart: Regular rate and rhythm Abdomen: Soft, non-tender and non-distended.  No masses, no hepatomegaly. Normal bowel sounds. Rectal: Deferred.  Patient agreed to hemoccult testing. Musculoskeletal: Symmetrical with no gross deformities  Extremities: LLE with some edema and venous stasis skin changes. Neurological: Alert oriented x 4, grossly nonfocal Psychological:  Alert and cooperative. Normal mood and affect  Assessment and Recommendations: -Spitting out blood:  Does not seem associated with vomiting, coughing, or reflux.  Will refer her to ENT for evaluation (says that she saw ENT a few years ago).  ? Sinus or pharyngeal.  Will try to change her PPI to zegerid BID (was ordered by Dr. Arlyce Dice at last visit, but the pharmacy did not have it at that time so she continued on Nexium); will give her samples as well.  Continue Carafate four times a day.  She is on coumadin for DVT's which are still present in her left leg. -Anemia, subacute drop in Hgb:  Hgb was down below baseline on last labs.  Will recheck a CBC today.  No overt sign of GIB.  Will check IFOB.

## 2012-12-12 NOTE — Patient Instructions (Addendum)
Please go to the basement level to have your labs drawn.  We also will have you do a stool test. We will call you with an appointment to see an Ear, Nose and Throat MD.   We sent a prescirption for generic zegerid, ( omeprazole with bicarbinate soda )  . [facility] [address]

## 2012-12-13 ENCOUNTER — Telehealth: Payer: Self-pay | Admitting: Gastroenterology

## 2012-12-13 ENCOUNTER — Encounter: Payer: Self-pay | Admitting: Internal Medicine

## 2012-12-13 ENCOUNTER — Ambulatory Visit: Payer: Medicare Other | Admitting: Internal Medicine

## 2012-12-13 NOTE — Telephone Encounter (Signed)
Received call from Bonita Quin with [facility] stating pt call her this AM stating she went to ED and no one knew about her coming.  She went home. I scheduled pt for clinic visit today at 2:00.  Pt called at both numbers, message left.   Pt has a dental appointment this AM, so I will keep trying to reach her.   Attending - Dr Dalphine Handing aware

## 2012-12-13 NOTE — Telephone Encounter (Signed)
Pt called in today spoke to schedulers and seemed to be confused as to what she is to do. I tried calling pt's brother and friend she has listed as contacts all numbers for both contacts just ring and brothers mailbox is full and is not accepting messages. Pt was instructed to go to the ER to be infused and get a complete work-up pt primary care physician ordered a CT of Abd and was to call her to schedule that wth pt. . Pt told schedulers she went to Radiology at [facility] long and they had no orders for her so she was going to the dentist. I will continue to try to get in touch with a family member.

## 2012-12-13 NOTE — Telephone Encounter (Signed)
Pt called here this morning and wanted to know what she is supposed to do. States she went to the hospital yesterday thinking she was supposed to have an xray but went home because "no one knew what was going on." Spoke with Francisca December in [facility]. She will call pt and see if they can get her in today.

## 2012-12-14 ENCOUNTER — Inpatient Hospital Stay (HOSPITAL_COMMUNITY)
Admission: AD | Admit: 2012-12-14 | Discharge: 2012-12-19 | DRG: 812 | Disposition: A | Discharge status: Home Health | Payer: Medicare Other | Source: Ambulatory Visit | Attending: Internal Medicine | Admitting: Internal Medicine

## 2012-12-14 ENCOUNTER — Ambulatory Visit (INDEPENDENT_AMBULATORY_CARE_PROVIDER_SITE_OTHER): Payer: Medicare Other | Admitting: Internal Medicine

## 2012-12-14 ENCOUNTER — Encounter (HOSPITAL_COMMUNITY): Payer: Self-pay | Admitting: General Practice

## 2012-12-14 ENCOUNTER — Encounter: Payer: Medicare Other | Admitting: Internal Medicine

## 2012-12-14 ENCOUNTER — Encounter: Payer: Self-pay | Admitting: Internal Medicine

## 2012-12-14 ENCOUNTER — Inpatient Hospital Stay (HOSPITAL_COMMUNITY): Payer: Medicare Other

## 2012-12-14 ENCOUNTER — Other Ambulatory Visit: Payer: Self-pay

## 2012-12-14 VITALS — BP 98/57 | HR 67 | Temp 97.8°F | Ht 60.0 in | Wt 133.5 lb

## 2012-12-14 DIAGNOSIS — K2270 Barrett's esophagus without dysplasia: Secondary | ICD-10-CM | POA: Diagnosis present

## 2012-12-14 DIAGNOSIS — I824Z9 Acute embolism and thrombosis of unspecified deep veins of unspecified distal lower extremity: Secondary | ICD-10-CM

## 2012-12-14 DIAGNOSIS — I350 Nonrheumatic aortic (valve) stenosis: Secondary | ICD-10-CM | POA: Diagnosis present

## 2012-12-14 DIAGNOSIS — D649 Anemia, unspecified: Secondary | ICD-10-CM | POA: Diagnosis present

## 2012-12-14 DIAGNOSIS — Z86718 Personal history of other venous thrombosis and embolism: Secondary | ICD-10-CM

## 2012-12-14 DIAGNOSIS — F3289 Other specified depressive episodes: Secondary | ICD-10-CM | POA: Diagnosis present

## 2012-12-14 DIAGNOSIS — I872 Venous insufficiency (chronic) (peripheral): Secondary | ICD-10-CM

## 2012-12-14 DIAGNOSIS — K921 Melena: Secondary | ICD-10-CM

## 2012-12-14 DIAGNOSIS — Z87891 Personal history of nicotine dependence: Secondary | ICD-10-CM

## 2012-12-14 DIAGNOSIS — K3184 Gastroparesis: Secondary | ICD-10-CM

## 2012-12-14 DIAGNOSIS — Z7901 Long term (current) use of anticoagulants: Secondary | ICD-10-CM

## 2012-12-14 DIAGNOSIS — I82402 Acute embolism and thrombosis of unspecified deep veins of left lower extremity: Secondary | ICD-10-CM | POA: Diagnosis present

## 2012-12-14 DIAGNOSIS — D61818 Other pancytopenia: Secondary | ICD-10-CM | POA: Diagnosis present

## 2012-12-14 DIAGNOSIS — I8000 Phlebitis and thrombophlebitis of superficial vessels of unspecified lower extremity: Secondary | ICD-10-CM | POA: Diagnosis present

## 2012-12-14 DIAGNOSIS — D62 Acute posthemorrhagic anemia: Principal | ICD-10-CM | POA: Diagnosis present

## 2012-12-14 DIAGNOSIS — G43909 Migraine, unspecified, not intractable, without status migrainosus: Secondary | ICD-10-CM | POA: Diagnosis present

## 2012-12-14 DIAGNOSIS — R042 Hemoptysis: Secondary | ICD-10-CM

## 2012-12-14 DIAGNOSIS — F329 Major depressive disorder, single episode, unspecified: Secondary | ICD-10-CM | POA: Diagnosis present

## 2012-12-14 DIAGNOSIS — I10 Essential (primary) hypertension: Secondary | ICD-10-CM | POA: Diagnosis present

## 2012-12-14 DIAGNOSIS — D6489 Other specified anemias: Secondary | ICD-10-CM

## 2012-12-14 DIAGNOSIS — K219 Gastro-esophageal reflux disease without esophagitis: Secondary | ICD-10-CM

## 2012-12-14 DIAGNOSIS — I359 Nonrheumatic aortic valve disorder, unspecified: Secondary | ICD-10-CM | POA: Diagnosis present

## 2012-12-14 HISTORY — DX: Anemia, unspecified: D64.9

## 2012-12-14 LAB — RETICULOCYTES
RBC.: 2.39 MIL/uL — ABNORMAL LOW (ref 3.87–5.11)
Retic Count, Absolute: 55 10*3/uL (ref 19.0–186.0)

## 2012-12-14 LAB — DIFFERENTIAL
Basophils Absolute: 0 10*3/uL (ref 0.0–0.1)
Eosinophils Relative: 1 % (ref 0–5)
Lymphocytes Relative: 35 % (ref 12–46)
Neutro Abs: 2.4 10*3/uL (ref 1.7–7.7)
Neutrophils Relative %: 55 % (ref 43–77)

## 2012-12-14 LAB — IRON AND TIBC
Iron: 13 ug/dL — ABNORMAL LOW (ref 42–135)
UIBC: 339 ug/dL (ref 125–400)

## 2012-12-14 LAB — CBC
Hemoglobin: 6.2 g/dL — CL (ref 12.0–15.0)
MCH: 28.5 pg (ref 26.0–34.0)
MCHC: 33.3 g/dL (ref 30.0–36.0)
MCHC: 34 g/dL (ref 30.0–36.0)
MCHC: 33 g/dL (ref 30.0–36.0)
MCV: 85.5 fL (ref 78.0–100.0)
Platelets: 274 10*3/uL (ref 150–400)
Platelets: 269 10*3/uL (ref 150–400)
Platelets: 248 10*3/uL (ref 150–400)
RBC: 2.18 MIL/uL — ABNORMAL LOW (ref 3.87–5.11)
RDW: 14.2 % (ref 11.5–15.5)
RDW: 14.1 % (ref 11.5–15.5)
WBC: 4.2 10*3/uL (ref 4.0–10.5)

## 2012-12-14 LAB — TROPONIN I: Troponin I: <0.3 ng/mL (ref <0.30)

## 2012-12-14 LAB — BASIC METABOLIC PANEL WITH GFR
CO2: 27 mEq/L (ref 19–32)
Calcium: 9.1 mg/dL (ref 8.4–10.5)
Creat: 0.87 mg/dL (ref 0.50–1.10)
Glucose, Bld: 96 mg/dL (ref 70–99)

## 2012-12-14 LAB — PRO B NATRIURETIC PEPTIDE: Pro B Natriuretic peptide (BNP): 646.7 pg/mL — ABNORMAL HIGH (ref 0–450)

## 2012-12-14 LAB — PREPARE RBC (CROSSMATCH)

## 2012-12-14 MED ORDER — SUMATRIPTAN SUCCINATE 50 MG PO TABS
50.0000 mg | ORAL_TABLET | ORAL | Status: DC | PRN
  Administered 2012-12-14: 50 mg via ORAL
  Filled 2012-12-14: qty 1

## 2012-12-14 MED ORDER — PANTOPRAZOLE SODIUM 40 MG IV SOLR
40.0000 mg | Freq: Two times a day (BID) | INTRAVENOUS | Status: DC
  Administered 2012-12-14 – 2012-12-15 (×3): 40 mg via INTRAVENOUS
  Filled 2012-12-14 (×4): qty 40

## 2012-12-14 MED ORDER — WARFARIN - PHARMACIST DOSING INPATIENT: Freq: Every day | Status: DC

## 2012-12-14 MED ORDER — ONDANSETRON HCL 4 MG/2ML IJ SOLN: 4.0000 mg | Freq: Three times a day (TID) | INTRAMUSCULAR | Status: DC | PRN

## 2012-12-14 MED ORDER — SUCRALFATE 1 G PO TABS
1.0000 g | ORAL_TABLET | Freq: Four times a day (QID) | ORAL | Status: DC
  Administered 2012-12-14 – 2012-12-16 (×11): 1 g via ORAL
  Filled 2012-12-14 (×16): qty 1

## 2012-12-14 MED ORDER — AMLODIPINE BESYLATE 10 MG PO TABS: 10.0000 mg | ORAL_TABLET | Freq: Every day | ORAL | Status: DC

## 2012-12-14 MED ORDER — HYDROCODONE-ACETAMINOPHEN 5-325 MG PO TABS
1.0000 | ORAL_TABLET | Freq: Four times a day (QID) | ORAL | Status: DC | PRN
  Administered 2012-12-14 – 2012-12-17 (×5): 1 via ORAL
  Filled 2012-12-14 (×5): qty 1

## 2012-12-14 MED ORDER — SODIUM CHLORIDE 0.9 % IJ SOLN
3.0000 mL | Freq: Two times a day (BID) | INTRAMUSCULAR | Status: DC
  Administered 2012-12-14 – 2012-12-18 (×9): 3 mL via INTRAVENOUS

## 2012-12-14 MED ORDER — SUMATRIPTAN SUCCINATE 25 MG PO TABS
25.0000 mg | ORAL_TABLET | ORAL | Status: DC | PRN
  Administered 2012-12-14 – 2012-12-16 (×4): 25 mg via ORAL
  Filled 2012-12-14 (×6): qty 1

## 2012-12-14 MED ORDER — CITALOPRAM HYDROBROMIDE 40 MG PO TABS
40.0000 mg | ORAL_TABLET | Freq: Every day | ORAL | Status: DC
  Administered 2012-12-14 – 2012-12-19 (×6): 40 mg via ORAL
  Filled 2012-12-14 (×7): qty 1

## 2012-12-14 NOTE — Procedures (Signed)
Preop diagnosis: Spitting up blood, acute anemia Postop diagnosis: Same Procedure: Transnasal fiberoptic laryngoscopy Surgeon: Jenne Pane Anesth: None Comp: None Findings: There is no bleeding source seen in the left nasal passage, pharynx, or larynx.  The right nasal passage was examined anteriorly but could not be seen posteriorly but there is no blood streaking from either side in the nasopharynx. Description: After discussing risks, benefits, and alternatives, the fiberoptic laryngoscope was passed through the right and then left nasal passage.  The right side did not allow passage to the posterior nasal passage.  Through the left side, the scope was able to be passed into the pharynx to view the pharynx and larynx.  After completion, the scope was removed and she was returned to nursing care in stable condition.

## 2012-12-14 NOTE — H&P (Signed)
Date: 12/14/2012               Patient Name:  Sherri Mcmillan MRN: [id number]  DOB: 02/28/1933 Age / Sex: 79 y.o., female   PCP: Dede Query, MD              Medical Service: Internal Medicine Teaching Service              Attending Physician: Dr. Farley Ly, MD    First Contact: Ivonne Andrew, MS 4 Pager: [phone]  Second Contact: Dr. Lorretta Harp Pager: [phone]            After Hours (After 5p/  First Contact Pager: [phone]  weekends / holidays): Second Contact Pager: [phone]   Chief Complaint:  "they called me for low hemoglobin"   History of Present Illness: Patient is a 79 y.o. woman with a past medical history significant for current DVT of left lower extremity for which she is taking warfarin, SOB, chronic pain, aortic stenosis, Barrett's esophagus, dysphagia, HTN and migraine who presents after she was found to have a hemogobin of 6.7 in the clinic today.  The patient reports a two week history of spitting up blood during the night.  She denies coughing up blood or vomiting, but describes a sensation of fluid in the back of her throat, after which she goes to the bathroom and spits approximately one teaspoon of blood mixed with saliva.  She denies any epistaxis or oral bleeding while brushing her teeth or awake.  These episodes of spitting blood occur only at night.  This spitting of blood occurs 5-6 times nightly.  She describes a feeling of light headedness and significant fatigue with some SOB for the past few days.  The patient also noted several black stools earlier in the week, however her last stool was green and she was found to be FOBT negative in clinic today.  Patient denies chest pain.  She experienced one episode of nausea on Sunday 8/17, but has not vomited.  She does note that her migraines have worsened over the past two days.  In the same time period she has complained of dry mouth and vague "throat pain", pointing to the sternal notch when asked the location of this  pain.  Sherri Mcmillan underwent an upper endoscopy on 5/25 which showed a stricture at the gastroesophageal junction, reflux esophagitis, and a 4cm hiatal hernia.  Gastroesophageal biopsies showed "INFLAMED GASTROESOPHAGEAL JUNCTIONAL MUCOSA WITH SURFACE FIBRINOUS MATERIAL, SUGGESTIVE OF ULCER BED".  Of note she is a former smoker who quit 3 years ago with an 18 year pack history.  She has been sober for 20 years but before that time drank heavily "everyday".  She denies any history of illicit drug use.  Sherri Mcmillan complains of a headache to 9/10 on the pain scale at admission.  The pain is located behind her eyes and across her forehead with occasional involvement of the temples.  She attributes this pain to her migraines which have been previously noted in the EMR.    Meds: Current Facility-Administered Medications  Medication Dose Route Frequency Provider Last Rate Last Dose   citalopram (CELEXA) tablet 40 mg  40 mg Oral Daily Lorretta Harp, MD       HYDROcodone-acetaminophen (NORCO/VICODIN) 5-325 MG per tablet 1 tablet  1 tablet Oral Q6H PRN Lorretta Harp, MD       ondansetron [facility]) injection 4 mg  4 mg Intravenous Q8H PRN Lorretta Harp, MD  pantoprazole (PROTONIX) injection 40 mg  40 mg Intravenous Q12H Lorretta Harp, MD       sodium chloride 0.9 % injection 3 mL  3 mL Intravenous Q12H Lorretta Harp, MD       sucralfate (CARAFATE) tablet 1 g  1 g Oral QID Lorretta Harp, MD        Allergies: Allergies as of 12/14/2012 - Review Complete 12/14/2012  Allergen Reaction Noted   Milk-related compounds  09/16/2012   Aspirin Rash 07/06/2006   Past Medical History  Diagnosis Date   Chronic pain     DJD knees, back, migranes, LLE varicose veins, and LLE neuropathy.    Aortic stenosis     Dx ECHO 2008 , mild and aymptomatic , needs ECHO q 3-5 yrs   Barrett's esophagus     Demonstrated on EGD 12/2010. EGD 09/17/2012 shows inflamed GE junctional mucosa without metaplasia, dysplasia, or malignancy.    Chronic insomnia    Chronic anxiety     Admission to [facility] (90s or early 2000)  for 6 weeks after mother died. Complicated again by death of her sister 2010. 2012 developed hallucinations and I refused to refill controlled meds unless she see psych which she is not agreable to   Elevated cholesterol 10/11    LDL 155. Her 10 year risk (decreasing her age to 64 as the calculator won't go to age 30) is 21% ish. If consider her non smoker (which I wouldn't even though she says 1 pak lasts 2 weeks) risk is 12% ish. So goal for LDL is 130 or 100.   HTN (hypertension)     Controlled with 2 drug therapy   Migraine    Cataract    Depression    Gastroparesis     Demonstrated on GES 12/2010 by Dr Dalene Seltzer   Bronchitis     hx of   Shortness of breath    Left leg DVT 09/28/2012   Past Surgical History  Procedure Laterality Date   Direct laryngoscopy   September 2008     preoperative diagnosis hoarseness with anterior right vocal cord lesion -  direct laryngoscopy and excisional biopsy of right anterior vocal cord lesion done by Dr. Ezzard Standing   Meniscectomy   July 2002     preoperative diagnosis torn medial meniscus right knee, partial medial meniscectomy, debridement chondroplasty patellofemoral joint, done by Dr. Madelon Lips   Abdominal hysterectomy     Back surgery     Hemorrhoid surgery     Appendectomy     Cataract extraction      left eye   Polypectomy  2000    Dr.Magod   Colonoscopy  2000&2005    Dr.Magod   Esophagogastroduodenoscopy  11/2010   Knee arthroscopy     Eye surgery     Rotator cuff repair  09/21/2011    rt shoulder   Esophagogastroduodenoscopy N/A 09/17/2012    Procedure: ESOPHAGOGASTRODUODENOSCOPY (EGD);  Surgeon: Hart Carwin, MD;  Location: [facility] ENDOSCOPY;  Service: Endoscopy;  Laterality: N/A;   Family History  Problem Relation Age of Onset   Stroke Neg Hx    Cancer Neg Hx    Colon cancer Neg Hx    Anesthesia problems Neg Hx    Hypotension Neg Hx     Malignant hyperthermia Neg Hx    Pseudochol deficiency Neg Hx    Heart disease Mother    Hypertension Mother    History   Social History   Marital Status: Single    Spouse Name:  N/A    Number of Children: N/A   Years of Education: N/A   Occupational History   Not on file.   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 Skin - no rashes, but notes skin changes over left lower leg where DVT was diagnosed. Eyes - no acute visual loss Ears - no acute hearing abnormality Nose/Sinus - no epistaxis, no rhinorrhea Mouth/Throat/Neck - no bleeding gums, swelling and pain as noted in HPI above. Cardiac - no chest pain or palpitations Respiratory - some SOB with exertion worse in last 2 days GI - One episode of nausea over the weekend, no vomiting, no diarrhea, dark stools as in HPI above. Urinary - no urgency or frequency.  Endorses one episode of difficulty initiating urine stream associated with an episode of dizziness in the past 2 days. Vascular - Negative except DVT noted above. MSK - some joint pain in her right knee 2/2 fall for which she was seen in ED recently. Psychiatric - significant anxiety (was previously treated with Valium >1 year ago), denies hallucinations (describes history of psychotic depression with suicidal  ideation).  Physical Exam: Height 5' (1.524 m), weight 60.555 kg (133 lb 8 oz), last menstrual period 04/26/1950. General appearance: appears stated age, sitting in chair in exam room wrapped in blanket. Head: Normocephalic, without obvious abnormality,tenderness above right mandible, tenderness to palption of temples Eyes: conjunctivae clear, PERRL Throat: moist mucosa, no erythema or bleeding. Mouth: Poor dentition, with black cavities of upper right molars. Neck: supple, no lymphadenopathy Lungs: CTAB, no wheezes or rales. Heart: RRR, II/VI systolic murmur heart loudest over RUSB Abdomen: Soft, nontender, nondistended, bowel sounds present, midline scar (patient attributes to oophorectomy) Extremities: Some swelling in left lower extremity.  Dark discoloration, warmth and tenderness to palpation of left ankle and calf.  Tenderness to palpation of right knee with mild swelling (patient notes falling on this knee which resulted in her last admission to the ED), No edema of right lower extremity.   Skin: no jaundice or rash Lymph nodes: no cervical, supraclavicular, parotid or submandibular lymphadenopathy Neurologic: Alert and Oriented, CN II-XII grossly intact  Lab results: Basic Metabolic Panel:  Recent Labs  21/30/86 0943  NA 141  K 3.5  CL 106  CO2 27  GLUCOSE 96  BUN 14  CREATININE 0.87  CALCIUM 9.1   Liver Function Tests: No results found for this basename: AST, ALT, ALKPHOS, BILITOT, PROT, ALBUMIN,  in the last 72 hours No results found for this basename: LIPASE, AMYLASE,  in the last 72 hours No results found for this basename: AMMONIA,  in the last 72 hours CBC:  Recent Labs  12/14/12 0943 12/14/12 1439 12/14/12 1656  WBC 4.3  4.2  --   NEUTROABS  --   --  2.4  HGB 6.7* 7.0*  --   HCT 20.1* 20.6*  --   MCV 85.5 86.2  --   PLT 274 269  --    Cardiac Enzymes:  Recent Labs  12/14/12 1439  TROPONINI <0.30   BNP:  Recent Labs  12/14/12 1439  PROBNP  646.7*   D-Dimer: No results found for this basename: DDIMER,  in the last 72 hours CBG: No results found for this basename: GLUCAP,  in the last 72 hours Hemoglobin A1C: No results found for this basename: HGBA1C,  in the last 72 hours Fasting Lipid Panel: No results found for this basename: CHOL, HDL, LDLCALC, TRIG, CHOLHDL, LDLDIRECT,  in the last 72 hours Thyroid Function Tests: No results found for this basename: TSH, T4TOTAL, FREET4, T3FREE, THYROIDAB,  in the last 72 hours Anemia Panel:  Recent Labs  12/14/12 1439  RETICCTPCT 2.3   Coagulation:  Recent Labs  12/14/12 0943  INR 2.8   Urine Drug Screen: Drugs of Abuse  No results found for this basename: labopia, cocainscrnur, labbenz, amphetmu, thcu, labbarb    Alcohol Level: No results found for this basename: ETH,  in the last 72 hours Urinalysis: No results found for this basename: COLORURINE, APPERANCEUR, LABSPEC, PHURINE, GLUCOSEU, HGBUR, BILIRUBINUR, KETONESUR, PROTEINUR, UROBILINOGEN, NITRITE, LEUKOCYTESUR,  in the last 72 hours Misc. Labs:     Imaging results:  No results found.  Other results:   Assessment & Plan by Problem:  #Anemia -  Patient has symptomatic anemia to 6.7 on admission (baseline 12-13).  Anemia is acute in onset with Hb close to baseline at 10.3 on 8/9 (12 days before admission).  Likely due to blood loss, patient describes dark stools recently as well as spitting frank blood, findings suggestive of gastroesophageal ulcer found on endoscopy 2 months ago, she has also recently been started on coumadin.  Blood loss could be either ENT in nature or from the GI tract.  Another possibility for GI blood loss would be AS induced von Willbrand disease (Heyde's Syndrome)... Last Echo in 2008 showed "Aortic valve thickness was mildly to moderately increased.  Findings were consistent with mild aortic valve stenosis".  Other considerations include causes of RBC destruction.  Inherited hemolytic  anemias are unlikely to be a new presentation in this 79 y.o., and autoimmune hemolytic anemias are unlikely in this age group as well.  Causes due to decreased RBC production such as malnutrition, bone marrow suppression, malignancy, low EPO levels and anemia of chronic inflammation are unlikely in this patient with decreased Hb and otherwise normal CBC with the exception of a borderline low-normal MCV.   The acute drop in Hb is not explained by iron deficiency, however there may be a small degree of iron deficiency, given the low normal MCV (85), and poor reticulocyte response at 2.3%. -Transfuse x1 unit. -ENT consult for possible source of bleeding. -FOBT -LDH, haptoglobin to evaluate for possible hemolysis. -EKG  #DVT - Patient was diagnosed with DVT 2 months ago and is on coumadin therapy.  Lower left leg still warm and tender to palpation. -Continue coumadin per pharmacy consult.  #New onset Heart Failure - ProBNP 646.7 on 8/21 which suggests a degree of heart failure as well, likely brought on by her aortic stenosis.  Patient denies pervious diagnosis of heart failure.  -Echo to reevaluate severity of AS and any changes indicative of CHF.  # Headache - Patient reports migraines,  and migraines have been documented in EMR previously.  Patient reports using tramadol with little effect at home and vicodin while hospitalized with similar lack of efficacy.  This headache is reportedly worse than previous migraines, however the patient explains this by saying that she is under a lot of stress with her symptomatic anemia.  Patient denies previously using a triptan. -Sumitriptan q2hrs prn -Continue home Tramadol prn  #Xerostomia - Pain of patient's jaw is located in the area of the submandibular salivary gland.  Patient also reports pain when eating in this area, though not with drinking water or with clenching of her teeth.  Sialolithiasis vs infection of the salivary gland seems most likely.   Sjogren's syndrome is also a possibility, which could explain the symptoms of dry mouth, but should not present with unilateral swelling and pain. -ENT consult  # Headache - Patient reports migraines, and migraines have been documented in EMR previously.  Patient reports using tramadol with little effect at home and vicodin while hospitalized with similar lack of efficacy.  This headache is reportedly worse than previous migraines, however the patient explains this by saying that she is under a lot of stress with her symptomatic anemia.  Patient denies previously using a triptan. -Sumitriptan q2hrs prn -Continue home Tramadol prn  #HTN - Patient has a history of HTN and was on Amlodipine 10mg  daily and Furosemide 40mg  daily at home.  On admission patient was found to have BP of 98/57. -Discontinue Amlodipine due to hypotension and patient reported lightheadedness. -Continue to monitor blood pressure closely.  #Prophylaxis - On coumadin for current DVT.  Last INR on admission 8/21 = 2.8.  Patient currently therapeutic, no need to anticoagulate. -Continue to closely monitor INR.      This is a Psychologist, occupational Note.  The care of the patient was discussed with Dr. Clyde Lundborg and the assessment and plan was formulated with their assistance.  Please see their note for official documentation of the patient encounter.   Signed: [facility] Provider Link Found] 12/14/2012, 1:45 PM

## 2012-12-14 NOTE — Progress Notes (Signed)
Internal Medicine Attending Admission Note Date: 12/14/2012  Patient name: Sherri Mcmillan Medical record number: [id number] Date of birth: 05-01-1932 Age: 79 y.o. Gender: female  I saw and evaluated the patient, and discussed her care with house staff; see the admission note by resident Dr. Clyde Lundborg for details of clinical findings and plans.   Chief Complaint(s): Anemia; dizziness  History - key components related to admission: Patient is a 79 year old woman with history of Barrett's esophagus, aortic stenosis, hypertension, left leg DVT 6/14, chronic pain, and other problems as outlined in the medical history admitted with low hemoglobin noted in the outpatient clinic; patient reports recent marked dizziness.  She reports that for the past couple of weeks she has noted blood in her mouth which she spits out; she is not sure where the blood is coming from.  She denies nosebleed; she also denies chest pain or abdominal pain.  She has also noted black stools for more than one week.   Physical Exam - key components related to admission:  Filed Vitals:   12/14/12 1100 12/14/12 1433  BP:  132/90  Pulse:  76  Temp:  98.2 F (36.8 C)  TempSrc:  Oral  Resp:  18  Height: 5' (1.524 m)   Weight: 133 lb 8 oz (60.555 kg)   SpO2:  100%   General: Alert, no distress Lungs: Clear Heart: Regular; 2/6 systolic murmur; no S3, no S4 Abdomen: Bowel sounds present, soft, nontender Extremities: 2+ left lower extremity the edema with calf tenderness  Lab results:   Basic Metabolic Panel:  Recent Labs  40/98/11 0943  NA 141  K 3.5  CL 106  CO2 27  GLUCOSE 96  BUN 14  CREATININE 0.87  CALCIUM 9.1    Hemoglobin  Date Value Range Status  12/14/2012 7.0* 12.0 - 15.0 g/dL Final  01/08/7828 6.7* 56.2 - 15.0 g/dL Final     Result repeated and verified.  12/12/2012 6.6 cL* 12.0 - 15.0 g/dL Final  05/26/8655 7.9* 84.6 - 15.0 g/dL Final  9/62/9528 9.4* 41.3 - 15.0 g/dL Final  05/30/4008 27.2* 53.6 - 15.0  g/dL Final  09/27/4032 74.2* 59.5 - 15.0 g/dL Final  09/27/8754 43.3* 29.5 - 15.0 g/dL Final     CBC:  Recent Labs  12/14/12 0943 12/14/12 1439  WBC 4.3 4.2  HGB 6.7* 7.0*  HCT 20.1* 20.6*  MCV 85.5 86.2  PLT 274 269    Recent Labs  12/14/12 1656  NEUTROABS 2.4  LYMPHSABS 1.5  MONOABS 0.4  EOSABS 0.0  BASOSABS 0.0    Cardiac Enzymes:  Recent Labs  12/14/12 1439  TROPONINI <0.30    BNP:  Recent Labs  12/14/12 1439  PROBNP 646.7*    Anemia Panel:  Recent Labs  12/14/12 1439  RETICCTPCT 2.3    Coagulation:  Recent Labs  12/14/12 0943  INR 2.8    Imaging results:  Dg Chest 2 View  12/14/2012   *RADIOLOGY REPORT*  Clinical Data: Hemoptysis  CHEST - 2 VIEW  Comparison: December 02, 2012.  Findings: Cardiomediastinal silhouette appears normal.  No acute pulmonary disease is noted.  Bony thorax is intact.  IMPRESSION: No acute cardiopulmonary abnormality seen.   Original Report Authenticated By: Lupita Raider.,  M.D.    Other results: EKG: Normal sinus rhythm, normal EKG   Assessment & Plan by Problem:  1.  Severe anemia.  Patient's hemoglobin has dropped from 10.3 on 12/02/2012 to around 7 today; she reports spitting up blood and black stool,  but the source is not clear.  ENT examination today was unrevealing.  Her anemia is symptomatic, with marked dizziness.  Plan is peripheral smear, LDH, and haptoglobin to rule out hemolysis; transfuse and follow hemoglobin; GI consult.  2.  DVT.  INR is therapeutic; plan for now is hold warfarin with close monitoring, may need to reverse if signs of acute bleeding.  3.  Hypertension.  Plan is hold antihypertensives in setting of anemia and concerns for bleeding.  4.  Other problems and plans as per the resident physician's note.

## 2012-12-14 NOTE — Consult Note (Signed)
Reason for Consult:spitting out blood, acute anemia Referring Physician: Dr. Rolena Infante Birks is an 79 y.o. female.  HPI: 79 year old female treated for DVT in May with Coumadin who fell in early August and struck her right face against the ground.  A couple of days later, she started noticing blood in her mouth at night on a nightly basis that makes it hard for her to sleep.  The amount each night totals around a tablespoon.  She does not sense that the bleeding is coming from her nose and is not coughing it up, per se.  She does not notice bleeding during the day.  She was seen in the ER a few days after the fall and a head CT looked fine.  Facial fracture was not suspected.  She underwent upper endoscopy in late May that did not show any active bleeding, only stricture at the GE junction, reflux esophagitis, and a hiatal hernia.  Between early August and present, her hemoglobin has fallen from 10.3 to 6.7.  She is admitted for blood transfusion and because of acute anemia.  Coumadin is being held currently.  FOBT was negative earlier today.  Past Medical History  Diagnosis Date  . Chronic pain     DJD knees, back, migranes, LLE varicose veins, and LLE neuropathy.   . Aortic stenosis     Dx ECHO 2008 , mild and aymptomatic , needs ECHO q 3-5 yrs  . Barrett's esophagus     Demonstrated on EGD 12/2010. EGD 09/17/2012 shows inflamed GE junctional mucosa without metaplasia, dysplasia, or malignancy.  . Chronic insomnia   . Chronic anxiety     Admission to [facility] ([facility]s or early 2000)  for 6 weeks after mother died. Complicated again by death of her sister 2010. 2012 developed hallucinations and I refused to refill controlled meds unless she see psych which she is not agreable to  . Elevated cholesterol 10/11    LDL 155. Her 10 year risk (decreasing her age to 66 as the calculator won't go to age 89) is 21% ish. If consider her non smoker (which I wouldn't even though she says 1 pak lasts 2 weeks) risk is  12% ish. So goal for LDL is 130 or 100.  Marland Kitchen HTN (hypertension)     Controlled with 2 drug therapy  . Migraine   . Cataract   . Depression   . Gastroparesis     Demonstrated on GES 12/2010 by Dr Dalene Seltzer  . Bronchitis     hx of  . Shortness of breath   . Left leg DVT 09/28/2012  . Anemia 12/14/2012    Past Surgical History  Procedure Laterality Date  . Direct laryngoscopy   September 2008     preoperative diagnosis hoarseness with anterior right vocal cord lesion -  direct laryngoscopy and excisional biopsy of right anterior vocal cord lesion done by Dr. Ezzard Standing  . Meniscectomy   July 2002     preoperative diagnosis torn medial meniscus right knee, partial medial meniscectomy, debridement chondroplasty patellofemoral joint, done by Dr. Madelon Lips  . Abdominal hysterectomy    . Back surgery    . Hemorrhoid surgery    . Appendectomy    . Cataract extraction      left eye  . Polypectomy  2000    Dr.Magod  . Colonoscopy  2000&2005    Dr.Magod  . Esophagogastroduodenoscopy  11/2010  . Knee arthroscopy    . Eye surgery    . Rotator  cuff repair  09/21/2011    rt shoulder  . Esophagogastroduodenoscopy N/A 09/17/2012    Procedure: ESOPHAGOGASTRODUODENOSCOPY (EGD);  Surgeon: Hart Carwin, MD;  Location: [facility] END[facility]COPY;  Service: Endoscopy;  Laterality: N/A;    Family History  Problem Relation Age of Onset  . Stroke Neg Hx   . Cancer Neg Hx   . Colon cancer Neg Hx   . Anesthesia problems Neg Hx   . Hypotension Neg Hx   . Malignant hyperthermia Neg Hx   . Pseudochol deficiency Neg Hx   . Heart disease Mother   . Hypertension Mother     Social History:  reports that she quit smoking about 2 years ago. Her smoking use included Cigarettes. She smoked 0.00 packs per day. She has never used smokeless tobacco. She reports that she does not drink alcohol or use illicit drugs.  Allergies:  Allergies  Allergen Reactions  . Milk-Related Compounds   . Aspirin Rash    Medications: I have  reviewed the patient's current medications.  Results for orders placed during the hospital encounter of 12/14/12 (from the past 48 hour(s))  TYPE AND SCREEN     Status: None   Collection Time    12/14/12  2:24 PM      Result Value Range   ABO/RH(D) O POS     Antibody Screen PENDING     Sample Expiration 12/17/2012    PREPARE RBC (CROSSMATCH)     Status: None   Collection Time    12/14/12  2:24 PM      Result Value Range   Order Confirmation ORDER PROCESSED BY BLOOD BANK    LACTATE DEHYDROGENASE     Status: Abnormal   Collection Time    12/14/12  2:39 PM      Result Value Range   LDH 258 (*) 94 - 250 U/L  CBC     Status: Abnormal   Collection Time    12/14/12  2:39 PM      Result Value Range   WBC 4.2  4.0 - 10.5 K/uL   RBC 2.39 (*) 3.87 - 5.11 MIL/uL   Hemoglobin 7.0 (*) 12.0 - 15.0 g/dL   HCT 16.1 (*) 09.6 - 04.5 %   MCV 86.2  78.0 - 100.0 fL   MCH 29.3  26.0 - 34.0 pg   MCHC 34.0  30.0 - 36.0 g/dL   RDW 40.9  81.1 - 91.4 %   Platelets 269  150 - 400 K/uL  TROPONIN I     Status: None   Collection Time    12/14/12  2:39 PM      Result Value Range   Troponin I <0.30  <0.30 ng/mL   Comment:            Due to the release kinetics of cTnI,     a negative result within the first hours     of the onset of symptoms does not rule out     myocardial infarction with certainty.     If myocardial infarction is still suspected,     repeat the test at appropriate intervals.  PRO B NATRIURETIC PEPTIDE     Status: Abnormal   Collection Time    12/14/12  2:39 PM      Result Value Range   Pro B Natriuretic peptide (BNP) 646.7 (*) 0 - 450 pg/mL  RETICULOCYTES     Status: Abnormal   Collection Time    12/14/12  2:39 PM  Result Value Range   Retic Ct Pct 2.3  0.4 - 3.1 %   RBC. 2.39 (*) 3.87 - 5.11 MIL/uL   Retic Count, Manual 55.0  19.0 - 186.0 K/uL    No results found.  Review of Systems  Gastrointestinal: Positive for melena.  Neurological: Positive for headaches.   All other systems reviewed and are negative.   Blood pressure 132/90, pulse 76, temperature 98.2 F (36.8 C), temperature source Oral, resp. rate 18, height 5' (1.524 m), weight 60.555 kg (133 lb 8 oz), last menstrual period 04/26/1950, SpO2 100.00%. Physical Exam  Constitutional: She is oriented to person, place, and time. She appears well-developed and well-nourished. No distress.  HENT:  Head: Normocephalic and atraumatic.  Right Ear: External ear normal.  Left Ear: External ear normal.  Nose: Nose normal.  Mouth/Throat: Oropharynx is clear and moist.  Right maxillary and mandible tenderness without deformity.  Eyes: Conjunctivae and EOM are normal. Pupils are equal, round, and reactive to light.  Neck: Normal range of motion. Neck supple.  Cardiovascular: Normal rate.   Respiratory: Effort normal.  GI:  Did not examine.  Genitourinary:  Did not examine.  Musculoskeletal: Normal range of motion.  Neurological: She is alert and oriented to person, place, and time. No cranial nerve deficit.  Skin: Skin is warm and dry.  Psychiatric: She has a normal mood and affect. Her behavior is normal. Judgment and thought content normal.    Assessment/Plan: Acute anemia, nightly spitting up of blood.  She is not currently bleeding. The oral and anterior nasal exam is unremarkable for a bleeding source.  A fiberoptic exam of the posterior nose and pharynx was performed and revealed no blood or bleeding source.  See separate procedure note.  It is hard to judge the problem currently since there is no active bleeding seen.  However, it seems that the total amount of blood that she has spit out would not account for her fairly dramatic decrease in her hemoglobin level over the past couple of weeks.  I worry about a GI bleed, perhaps upper GI where blood might reflux up her esophagus when supine at night.  Consider GI consultation.  Consider holding Coumadin until bleeding problem is diagnosed and  controlled.  Will follow.  ,  12/14/2012, 4:31 PM

## 2012-12-14 NOTE — H&P (Signed)
Hospital Admission Note Date: 12/14/2012  Patient name: Sherri Mcmillan Medical record number: [id number] Date of birth: 28-Jul-1932 Age: 79 y.o. Gender: female PCP: LI, NA, MD  Medical Service: IMTS  Attending physician: Dr. Governor Specking Hours (7AM-5PM):  1st Contact: Cottie Banda, MS4 Pager:[phone] 2nd Contact: Dr. Clyde Lundborg Pager:[phone]  ** If no return call within 15 minutes (after trying both pagers listed above), please call after hours pagers.  After 5 pm or weekends: 1st Contact: Pager: [phone] 2nd Contact: Pager: [phone]   Chief Complaint: Anemia with Hgb 6.6 and dizziness.   History of Present Illness:  Patient is a 79 year old woman with history of Barrett's esophagus, aortic stenosis, hypertension, left leg DVT 6/14 (on coumadin), who was admitted for anemia with Hgb of 6.7 and dizziness.  Patient reports that she has noted blood in her mouth, but she is not sure where the blood is coming from. She denies any epistaxis or oral bleeding while brushing her teeth. These episodes of spitting blood occur only at night, proximately occurs 5-6 times nightly. The patient also noted several black stools earlier in the week, however her last stool was green and she was found to be FOBT negative in clinic today. Patient had upper endoscopy on 5/25 which showed a stricture at the gastroesophageal junction, reflux esophagitis, and a 4cm hiatal hernia. Gastroesophageal biopsies showed "inflamed gastroesophageal junctional mucosa with surface fibrinous material, suggestive of ulcer bed". She has been taking Zegerid at home.  She has light headedness and significant fatigue with some SOB for the past few days. Patient denies chest pain. Currently, she does no have nausea or abdominal pain.   Denies fever, chills, cough, chest pain, SOB,  abdominal pain, dysuria, urgency, frequency, hematuria.   Meds: No current outpatient prescriptions on file.   Medications Prior to Admission   Medication Sig Dispense Refill  . amLODipine (NORVASC) 10 MG tablet Take 1 tablet (10 mg total) by mouth daily.  90 tablet  4  . citalopram (CELEXA) 40 MG tablet Take 40 mg by mouth daily.      . furosemide (LASIX) 40 MG tablet Take 1.5 tablets (60 mg total) by mouth daily.  135 tablet  4  . HYDROcodone-acetaminophen (NORCO/VICODIN) 5-325 MG per tablet Take 1 tablet by mouth every 4 (four) hours as needed for pain.  15 tablet  0  . omeprazole-sodium bicarbonate (ZEGERID) 40-1100 MG per capsule Take 1 capsule by mouth daily before breakfast.  60 capsule  2  . promethazine (PHENERGAN) 12.5 MG tablet Take 1 tablet (12.5 mg total) by mouth every 6 (six) hours as needed for nausea.  30 tablet  1  . sucralfate (CARAFATE) 1 G tablet Take 1 tablet (1 g total) by mouth 4 (four) times daily.  120 tablet  4  . warfarin (COUMADIN) 5 MG tablet Take 2.5-5 mg by mouth daily. Take 2.5 mg on  Monday, and Thursday.  Take 5 mg on Tuesday, Wednesday, Friday, Saturday, and Sunday        Allergies: Allergies as of 12/14/2012 - Review Complete 12/14/2012  Allergen Reaction Noted  . Milk-related compounds  09/16/2012  . Aspirin Rash 07/06/2006   Past Medical History  Diagnosis Date  . Chronic pain     DJD knees, back, migranes, LLE varicose veins, and LLE neuropathy.   . Aortic stenosis     Dx ECHO 2008 , mild and aymptomatic , needs ECHO q 3-5 yrs  . Barrett's esophagus     Demonstrated on  EGD 12/2010. EGD 09/17/2012 shows inflamed GE junctional mucosa without metaplasia, dysplasia, or malignancy.  . Chronic insomnia   . Chronic anxiety     Admission to [facility] (90s or early 2000)  for 6 weeks after mother died. Complicated again by death of her sister 2010. 2012 developed hallucinations and I refused to refill controlled meds unless she see psych which she is not agreable to  . Elevated cholesterol 10/11    LDL 155. Her 10 year risk (decreasing her age to 25 as the calculator won't go to age 62) is 21% ish. If  consider her non smoker (which I wouldn't even though she says 1 pak lasts 2 weeks) risk is 12% ish. So goal for LDL is 130 or 100.  Marland Kitchen HTN (hypertension)     Controlled with 2 drug therapy  . Migraine   . Cataract   . Depression   . Gastroparesis     Demonstrated on GES 12/2010 by Dr Dalene Seltzer  . Bronchitis     hx of  . Shortness of breath   . Left leg DVT 09/28/2012  . Anemia 12/14/2012   Past Surgical History  Procedure Laterality Date  . Direct laryngoscopy   September 2008     preoperative diagnosis hoarseness with anterior right vocal cord lesion -  direct laryngoscopy and excisional biopsy of right anterior vocal cord lesion done by Dr. Ezzard Standing  . Meniscectomy   July 2002     preoperative diagnosis torn medial meniscus right knee, partial medial meniscectomy, debridement chondroplasty patellofemoral joint, done by Dr. Madelon Lips  . Abdominal hysterectomy    . Back surgery    . Hemorrhoid surgery    . Appendectomy    . Cataract extraction      left eye  . Polypectomy  2000    Dr.Magod  . Colonoscopy  2000&2005    Dr.Magod  . Esophagogastroduodenoscopy  11/2010  . Knee arthroscopy    . Eye surgery    . Rotator cuff repair  09/21/2011    rt shoulder  . Esophagogastroduodenoscopy N/A 09/17/2012    Procedure: ESOPHAGOGASTRODUODENOSCOPY (EGD);  Surgeon: Hart Carwin, MD;  Location: [facility] ENDOSCOPY;  Service: Endoscopy;  Laterality: N/A;   Family History  Problem Relation Age of Onset  . Stroke Neg Hx   . Cancer Neg Hx   . Colon cancer Neg Hx   . Anesthesia problems Neg Hx   . Hypotension Neg Hx   . Malignant hyperthermia Neg Hx   . Pseudochol deficiency Neg Hx   . Heart disease Mother   . Hypertension Mother    History   Social History  . Marital Status: Single    Spouse Name: N/A    Number of Children: N/A  . Years of Education: N/A   Occupational History  . Not on file.   Social History Main Topics  . Smoking status: Former Smoker    Types: Cigarettes    Quit date:  06/01/2010  . Smokeless tobacco: Never Used  . Alcohol Use: No  . Drug Use: No  . Sexual Activity: No   Other Topics Concern  . 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  Physical Exam:   Filed Vitals:   12/14/12 1951 12/14/12 1953 12/14/12 1957 12/14/12 1959  BP: 128/70 127/66 133/68 134/70  Pulse: 76 66 73 68  Temp:      TempSrc:      Resp: 18 18 18 18   Height:      Weight:      SpO2:        General: Not in acute distress HEENT: PERRL, EOMI, no scleral icterus, No JVD or bruit Cardiac: S1/S2, RRR, 2/6 of systolic murmur heart over RUSB,  No gallops or rubs Pulm: Good air movement bilaterally. Clear to auscultation bilaterally. No rales, wheezing, rhonchi or rubs. Abd: Soft, nondistended, nontender, no rebound pain, no organomegaly, BS present Ext: Mild swelling in left lower extremity. Mild tenderness over the left ankle and calf. No edema of right lower extremity. 2+DP/PT pulse bilaterally Musculoskeletal: No joint deformities, erythema, or stiffness, ROM full Skin: No rashes.  Neuro: Alert and oriented X3, cranial nerves II-XII grossly intact, muscle strength 5/5 in all extremeties, sensation to light touch intact.  Psych: Patient is not psychotic, no suicidal or hemocidal ideation.  Lab results: Basic Metabolic Panel:  Recent Labs  16/10/96 0943  NA 141  K 3.5  CL 106  CO2 27  GLUCOSE 96  BUN 14  CREATININE 0.87  CALCIUM 9.1   Liver Function Tests: No results found for this basename: AST, ALT, ALKPHOS, BILITOT, PROT, ALBUMIN,  in the  last 72 hours No results found for this basename: LIPASE, AMYLASE,  in the last 72 hours No results found for this basename: AMMONIA,  in the last 72 hours CBC:  Recent Labs  12/14/12 0943 12/14/12 1439 12/14/12 1656  WBC 4.3 4.2  --   NEUTROABS  --   --  2.4  HGB 6.7* 7.0*  --   HCT 20.1* 20.6*  --   MCV 85.5 86.2  --   PLT 274 269  --    Cardiac Enzymes:  Recent Labs  12/14/12 1439  TROPONINI <0.30   BNP:  Recent Labs  12/14/12 1439  PROBNP 646.7*   D-Dimer: No results found for this basename: DDIMER,  in the last 72 hours CBG: No results found for this basename: GLUCAP,  in the last 72 hours Hemoglobin A1C: No results found for this basename: HGBA1C,  in the last 72 hours Fasting Lipid Panel: No results found for this basename: CHOL, HDL, LDLCALC, TRIG, CHOLHDL, LDLDIRECT,  in the last 72 hours Thyroid Function Tests: No results found for this basename: TSH, T4TOTAL, FREET4, T3FREE, THYROIDAB,  in the last 72 hours Anemia Panel:  Recent Labs  12/14/12 1439  RETICCTPCT 2.3   Coagulation:  Recent Labs  12/14/12 0943  INR 2.8   Urine Drug Screen: Drugs of Abuse  No results found for this basename: labopia,  cocainscrnur,  labbenz,  amphetmu,  thcu,  labbarb    Alcohol Level: No results found for this basename: ETH,  in the last 72 hours Urinalysis: No results found for this basename: COLORURINE, APPERANCEUR, LABSPEC, PHURINE, GLUCOSEU, HGBUR, BILIRUBINUR, KETONESUR, PROTEINUR, UROBILINOGEN, NITRITE, LEUKOCYTESUR,  in the last 72 hours Misc. Labs:  Imaging results:  Dg Chest 2 View  12/14/2012   *RADIOLOGY REPORT*  Clinical Data: Hemoptysis  CHEST - 2 VIEW  Comparison: December 02, 2012.  Findings: Cardiomediastinal silhouette appears normal.  No acute pulmonary disease is noted.  Bony thorax is intact.  IMPRESSION: No acute cardiopulmonary abnormality seen.   Original Report Authenticated By: Lupita Raider.,  M.D.    Other results:  Assessment &  Plan by Problem: Principal Problem:   Anemia Active Problems:   VENOUS INSUFFICIENCY   HTN (hypertension)   Aortic stenosis   Barrett's esophagus   Other specified anemias   #: Symptomatic anemia.  Etiology is not clear now. Her Hgb decreased from 10.3 on 12/02/2012 to 6.7 today. One possibility is hemoptysis.  ENT was consulted. Dr. Jenne Pane performed the fiberoptic laryngoscope , which showed no blood or bleeding source. Dr. Jenne Pane recommended GI consultation. Another positivity is GI bleeding given her hx of possible ulcers and recent black stool. Patient's FOBT is negative, indicating no active bleeding currently. Currently bp is 98/59 and HR is 67/min.   - Appreciate ENT consultation in managing our patient.  - Will get GI consult - Will type and screen-->transfuse one U of blood today - q8h CBC to monitor Hgb level.  - Check LDH and haptoglobin to r/o hemolysis - Check EKG and trop X 1 - Zofran for nausea - Protonix 40 mg bid IV and sucralfate - Hold Lasix and Amlodipine   # Hypertension. bp is 98/57.  -will hold antihypertensives in setting of anemia and concerns for bleeding.  # DVT - Patient was diagnosed with DVT 2 months ago and is on coumadin therapy. inr 2.8 today.  Lower left leg is still warm and tender to palpation. Dr Josem Kaufmann suggested to continue coumadin.   - will continue coumadin per pharmacy consult. - Will skip one dose today since INR is 2.8  # Headache - Patient has hx of migraines. She took tramadol at home without help.  - add Sumitriptan 25 mg bid  # DVT PPx:  On coumadin for current DVT.  Last INR on admission 8/21 = 2.8.  Patient currently therapeutic, no need to anticoagulate. -Continue to closely monitor INR.  #  F/E/N  -SL -Monitor electrolytes by checking BMP -NPO.   Dispo: Disposition is deferred at this time, awaiting improvement of current medical problems. Anticipated discharge in approximately 2 to 3 day(s).   The patient does have a  current PCP (LI, NA, MD), therefore is requiring OPC follow-up after discharge.   The patient does not have transportation limitations that hinder transportation to clinic appointments.  Signed:  Lorretta Harp, MD PGY2, Internal Medicine Teaching Service Pager: [phone]  12/14/2012, 8:35 PM

## 2012-12-14 NOTE — Progress Notes (Addendum)
ANTICOAGULATION CONSULT NOTE - Initial Consult  Pharmacy Consult for Coumadin Indication: recent DVT  Allergies  Allergen Reactions  . Milk-Related Compounds   . Aspirin Rash    Patient Measurements: Height: 5' (152.4 cm) Weight: 133 lb 8 oz (60.555 kg) IBW/kg (Calculated) : 45.5  Vital Signs: Temp: 97.8 F (36.6 C) (08/21 0936) Temp src: Oral (08/21 0936) BP: 98/57 mmHg (08/21 0936) Pulse Rate: 67 (08/21 0936)  Labs:  Recent Labs  12/12/12 1049 12/14/12 0943  HGB 6.6 cL* 6.7*  HCT 19.8 aL* 20.1*  PLT 242.0 274  INR  --  2.8  CREATININE  --  0.87    Estimated Creatinine Clearance: 43.3 ml/min (by C-G formula based on Cr of 0.87).   Medical History: Past Medical History  Diagnosis Date  . Chronic pain     DJD knees, back, migranes, LLE varicose veins, and LLE neuropathy.   . Aortic stenosis     Dx ECHO 2008 , mild and aymptomatic , needs ECHO q 3-5 yrs  . Barrett's esophagus     Demonstrated on EGD 12/2010. EGD 09/17/2012 shows inflamed GE junctional mucosa without metaplasia, dysplasia, or malignancy.  . Chronic insomnia   . Chronic anxiety     Admission to [facility] (90s or early 2000)  for 6 weeks after mother died. Complicated again by death of her sister 2010. 2012 developed hallucinations and I refused to refill controlled meds unless she see psych which she is not agreable to  . Elevated cholesterol 10/11    LDL 155. Her 10 year risk (decreasing her age to 48 as the calculator won't go to age 38) is 21% ish. If consider her non smoker (which I wouldn't even though she says 1 pak lasts 2 weeks) risk is 12% ish. So goal for LDL is 130 or 100.  Marland Kitchen HTN (hypertension)     Controlled with 2 drug therapy  . Migraine   . Cataract   . Depression   . Gastroparesis     Demonstrated on GES 12/2010 by Dr Dalene Seltzer  . Bronchitis     hx of  . Shortness of breath   . Left leg DVT 09/28/2012    Medications: Home med rec pending  Assessment: 79 y.o. female presents from IM  clinic today with Hgb 6.7. Coumadin PTA for recent DVT 09/28/12. Dose changed on 8/18 by Dr. Alexandria Lodge to 5 mg daily except for 2.5 mg on Tues and Fri (pt states she is taking 5mg  daily except for 2.5mg  on Mon and Thur). Baseline INR 2.8. Last dose yesterday. Pt reports spitting up blood during the night for past 2 weeks (~1 tsp 5-6x per night). Also pt reports dark stools earlier in the week but FOBT neg at clinic today. MD aware of recent bleeding, low Hgb - would like to continue coumadin for now.   Goal of Therapy:  INR 2-3 Monitor platelets by anticoagulation protocol: Yes   Plan:  1) Will hold coumadin today with INR 2.8 and low Hgb - spoke with Dr. Clyde Lundborg about this plan 2) Will need to watch Hgb (noted MD checking q8h) and any further bleeding very closely. 3) Daily INR  Christoper Fabian, PharmD, BCPS Clinical pharmacist, pager [phone] 12/14/2012,12:57 PM

## 2012-12-14 NOTE — Progress Notes (Signed)
Report called to RN on 6700.  

## 2012-12-14 NOTE — Progress Notes (Signed)
Pt arrived to the unit with dx. Anemia. Pt alert and oriented x4. Ambulatory with stand by assist. No skin break down noted. Pt denies any discomfort and no distress noted. Will cont to monitor.

## 2012-12-14 NOTE — Progress Notes (Addendum)
CRITICAL VALUE ALERT  Critical value received:  hgb = 6.2  Date of notification:  12/14/12   Time of notification:  2144  Critical value read back:yes  Nurse who received alert:  K. Christell Constant, RN   MD notified (1st page):  Dr. Claudell Kyle  Time of first page:  2148  Responding MD:  Dr. Claudell Kyle, [facility] to order post transfusion cbc in appropriate time frame  Time MD responded:  2150

## 2012-12-14 NOTE — Progress Notes (Signed)
Patient ID: Sherri Mcmillan, female   DOB: [birth year], 79 y.o.   MRN: [id number]   Subjective:   Patient ID: Sherri Mcmillan female   DOB: 10/12/1932 79 y.o.   MRN: [id number]  HPI: [name] is a 79 y.o. woman with history of HTN, aortic stenosis, left leg DVT on chronic Coumadin, GERD/Barrett's esophagus/esophageal stricture, chronic pain, anxiety, insomnia who presents with "spitting up blood" and acute blood loss anemia.    On 8/5, pt started having nighttime hemoptysis, coughing up nickel-sized amounts of dark blood, also had a melanotic stool around that time.  She had a fall later that week where she fell on her knees and hit the right side of her face/jaw.  She was evaluated in the ED on 8/9, had a negative CT head; Hgb 10.3 at that time (down from 11.7 on 6/8), BUN elevated at 24, supratherapeutic INR of 4.7 (goal 2-3).  Coumadin was held at that time, and she followed up here in clinic on 8/11.  At that visit, her Hgb was 9.4 with INR 4.2, but she was asymptomatic.  She was instructed to continue holding Coumadin and return to clinic on 8/13 to repeat CBC and INR.  On 8/13, her Hgb was 7.9 with INR 1.7.  She then went to GI clinic on 8/19 for follow-up; she was switched from Nexium to Zegerid, continued carafate but they did not feel pt was having UGIB.  Of note, pt had an EGD in 5/14 which revealed a stricture at the GE junction, esophagitis c/w reflux, and a 4 cm hiatal hernia.  She had repeat labs drawn in GI clinic which came back with Hgb 6.6, INR 2.1 after pt had left.  She is following up in [facility] today for this anemia.   Pt states that she continues to spit up blood 3-4 times daily but only at night.  She thought it was coming from her teeth but has not noticed any blood on her pillow case or any blood in her spit after brushing her teeth.  She has felt dizzy/"swimmy headed" and fatigued in the past couple of days but no loss of consciousness.  Denies SOB, chest pain, palpitations, fevers,  night sweats.  She had states that she did not move her bowels yesterday but had a small green bowl movement today.  She will allow [place] to perform FOBT during this visit.   Past Medical History  Diagnosis Date  . Chronic pain     DJD knees, back, migranes, LLE varicose veins, and LLE neuropathy.   . Aortic stenosis     Dx ECHO 2008 , mild and aymptomatic , needs ECHO q 3-5 yrs  . Barrett's esophagus     Demonstrated on EGD 12/2010. EGD 09/17/2012 shows inflamed GE junctional mucosa without metaplasia, dysplasia, or malignancy.  . Chronic insomnia   . Chronic anxiety     Admission to [facility] (90s or early 2000)  for 6 weeks after mother died. Complicated again by death of her sister 2010. 2012 developed hallucinations and I refused to refill controlled meds unless she see psych which she is not agreable to  . Elevated cholesterol 10/11    LDL 155. Her 10 year risk (decreasing her age to 83 as the calculator won't go to age 76) is 21% ish. If consider her non smoker (which I wouldn't even though she says 1 pak lasts 2 weeks) risk is 12% ish. So goal for LDL is 130 or 100.  Marland Kitchen HTN (hypertension)  Controlled with 2 drug therapy  . Migraine   . Cataract   . Depression   . Gastroparesis     Demonstrated on GES 12/2010 by Dr Dalene Seltzer  . Bronchitis     hx of  . Shortness of breath   . Left leg DVT 09/28/2012  . Anemia 12/14/2012   No current facility-administered medications for this visit.   No current outpatient prescriptions on file.   Facility-Administered Medications Ordered in Other Visits  Medication Dose Route Frequency Provider Last Rate Last Dose  . citalopram (CELEXA) tablet 40 mg  40 mg Oral Daily Lorretta Harp, MD   40 mg at 12/14/12 1419  . HYDROcodone-acetaminophen (NORCO/VICODIN) 5-325 MG per tablet 1 tablet  1 tablet Oral Q6H PRN Lorretta Harp, MD   1 tablet at 12/14/12 1437  . ondansetron (ZOFRAN) injection 4 mg  4 mg Intravenous Q8H PRN Lorretta Harp, MD      . pantoprazole (PROTONIX)  injection 40 mg  40 mg Intravenous Q12H Lorretta Harp, MD   40 mg at 12/14/12 1421  . sodium chloride 0.9 % injection 3 mL  3 mL Intravenous Q12H Lorretta Harp, MD   3 mL at 12/14/12 1245  . sucralfate (CARAFATE) tablet 1 g  1 g Oral QID Lorretta Harp, MD   1 g at 12/14/12 1419  . SUMAtriptan (IMITREX) tablet 50 mg  50 mg Oral Q2H PRN Linward Headland, MD      . Warfarin - Pharmacist Dosing Inpatient   Does not apply q1800 Lavonia Dana, [facility]       Family History  Problem Relation Age of Onset  . Stroke Neg Hx   . Cancer Neg Hx   . Colon cancer Neg Hx   . Anesthesia problems Neg Hx   . Hypotension Neg Hx   . Malignant hyperthermia Neg Hx   . Pseudochol deficiency Neg Hx   . Heart disease Mother   . Hypertension Mother    History   Social History  . Marital Status: Single    Spouse Name: N/A    Number of Children: N/A  . Years of Education: N/A   Social History Main Topics  . Smoking status: Former Smoker    Types: Cigarettes    Quit date: 06/01/2010  . Smokeless tobacco: Never Used  . Alcohol Use: No  . Drug Use: No  . Sexual Activity: No   Other Topics Concern  . 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 Negative for fever, chills and diaphoresis.  HENT: Negative for congestion and sore throat.   Eyes: Negative for blurred vision.  Respiratory: Positive for hemoptysis. Negative for cough and shortness of breath.   Cardiovascular: Negative for chest pain, palpitations and leg swelling.  Gastrointestinal: Positive for melena. Negative for nausea, vomiting, abdominal pain, diarrhea,  constipation and blood in stool.  Genitourinary: Negative for dysuria and flank pain.  Musculoskeletal: Positive for falls. Negative for myalgias and back pain.  Skin: Negative for rash.  Neurological: Positive for dizziness and weakness. Negative  for tingling, focal weakness, loss of consciousness and headaches.    Objective:  Physical Exam: Filed Vitals:   12/14/12 0936  BP: 98/57  Pulse: 67  Temp: 97.8 F (36.6 C)  TempSrc: Oral  Height: 5' (1.524 m)  Weight: 133 lb 8 oz (60.555 kg)  SpO2: 99%   General: alert, cooperative, and in no apparent distress HEENT:  vision grossly intact, oropharynx clear and non-erythematous, no blood appreciated Neck: supple, no lymphadenopathy, JVD, or carotid bruits Lungs: clear to ascultation bilaterally, normal work of respiration, no wheezes, rales, ronchi Heart: regular rate and rhythm, II/VI crescendo-decrescendo murmur at LUSB c/s AS, no gallops, or rubs Abdomen: soft, non-tender, non-distended, normal bowel sounds Extremities: no cyanosis, clubbing, or edema Neurologic: alert & oriented X3, cranial nerves II-XII intact, strength grossly intact, sensation intact to light touch FOBT negative  Assessment & Plan:  Patient discussed with Dr. Josem Kaufmann.   Stat INR, CBC, BMP done in clinic. INR 2.8, Hgb 6.7/Hct 20.1, BUN wnl, FOBT negative. Symptomatic acute blood loss anemia with unknown source of bleed. Admit to observation for type and cross, transfusion, and ENT vs GI consult to determine etiology of acute blood loss.

## 2012-12-14 NOTE — H&P (Signed)
I have seen the patient and reviewed HPI note by medical student,  Josh and discussed the care of the patient with them. Please see my note for documentation of my findings, assessment, and plans  Lorretta Harp, MD PGY3, Internal Medicine Teaching Service Pager: [phone]

## 2012-12-15 DIAGNOSIS — K219 Gastro-esophageal reflux disease without esophagitis: Secondary | ICD-10-CM

## 2012-12-15 DIAGNOSIS — D649 Anemia, unspecified: Secondary | ICD-10-CM

## 2012-12-15 DIAGNOSIS — K3184 Gastroparesis: Secondary | ICD-10-CM

## 2012-12-15 DIAGNOSIS — R042 Hemoptysis: Secondary | ICD-10-CM

## 2012-12-15 DIAGNOSIS — D62 Acute posthemorrhagic anemia: Secondary | ICD-10-CM

## 2012-12-15 DIAGNOSIS — K2270 Barrett's esophagus without dysplasia: Secondary | ICD-10-CM

## 2012-12-15 DIAGNOSIS — Z86718 Personal history of other venous thrombosis and embolism: Secondary | ICD-10-CM

## 2012-12-15 DIAGNOSIS — K921 Melena: Secondary | ICD-10-CM

## 2012-12-15 LAB — COMPREHENSIVE METABOLIC PANEL
AST: 18 U/L (ref 0–37)
Albumin: 3.5 g/dL (ref 3.5–5.2)
Alkaline Phosphatase: 59 U/L (ref 39–117)
BUN: 9 mg/dL (ref 6–23)
CO2: 25 mEq/L (ref 19–32)
Chloride: 107 mEq/L (ref 96–112)
Potassium: 3.5 mEq/L (ref 3.5–5.1)
Total Bilirubin: 0.5 mg/dL (ref 0.3–1.2)

## 2012-12-15 LAB — CBC
HCT: 24.2 % — ABNORMAL LOW (ref 36.0–46.0)
HCT: 26 % — ABNORMAL LOW (ref 36.0–46.0)
Hemoglobin: 8.1 g/dL — ABNORMAL LOW (ref 12.0–15.0)
Hemoglobin: 8.7 g/dL — ABNORMAL LOW (ref 12.0–15.0)
MCV: 85.5 fL (ref 78.0–100.0)
RBC: 2.81 MIL/uL — ABNORMAL LOW (ref 3.87–5.11)
RBC: 2.83 MIL/uL — ABNORMAL LOW (ref 3.87–5.11)
RBC: 3.04 MIL/uL — ABNORMAL LOW (ref 3.87–5.11)
RDW: 14.1 % (ref 11.5–15.5)
WBC: 3.9 10*3/uL — ABNORMAL LOW (ref 4.0–10.5)
WBC: 5.5 10*3/uL (ref 4.0–10.5)

## 2012-12-15 LAB — PROTIME-INR
INR: 2.24 — ABNORMAL HIGH (ref 0.00–1.49)
Prothrombin Time: 24.1 seconds — ABNORMAL HIGH (ref 11.6–15.2)

## 2012-12-15 LAB — VITAMIN B12: Vitamin B-12: 426 pg/mL (ref 211–911)

## 2012-12-15 LAB — HAPTOGLOBIN: Haptoglobin: 66 mg/dL (ref 45–215)

## 2012-12-15 LAB — TYPE AND SCREEN
ABO/RH(D): O POS
Antibody Screen: POSITIVE

## 2012-12-15 LAB — APTT: aPTT: 40 seconds — ABNORMAL HIGH (ref 24–37)

## 2012-12-15 MED ORDER — PANTOPRAZOLE SODIUM 40 MG PO TBEC
40.0000 mg | DELAYED_RELEASE_TABLET | Freq: Two times a day (BID) | ORAL | Status: DC
  Administered 2012-12-15 – 2012-12-17 (×3): 40 mg via ORAL
  Filled 2012-12-15: qty 1

## 2012-12-15 MED ORDER — DEXTROSE-NACL 5-0.9 % IV SOLN
INTRAVENOUS | Status: DC
  Administered 2012-12-15: 12:00:00 via INTRAVENOUS

## 2012-12-15 MED ORDER — VITAMIN K1 10 MG/ML IJ SOLN
1.0000 mg | Freq: Once | INTRAVENOUS | Status: AC
  Administered 2012-12-15: 1 mg via INTRAVENOUS
  Filled 2012-12-15: qty 0.1

## 2012-12-15 MED ORDER — PANTOPRAZOLE SODIUM 40 MG PO TBEC: 40.0000 mg | DELAYED_RELEASE_TABLET | Freq: Two times a day (BID) | ORAL | Status: DC

## 2012-12-15 NOTE — Progress Notes (Signed)
Sherri Mcmillan is a 79 y.o. woman with a PMH significant for DVT diagnosed 3 months ago for which she is taking coumadin, SOB, chronic migraine headaches, aortic stenosis, barrett's esophagus, dysphagia and HTN who was admitted on 8/21 for symptomatic anemia with a hemoglobin of 6.7 noted in clinic.  Subjective:  Patient reports feeling better overnight after receiving a transfusion of 1 unit.  She still complains of lightheadedness when rising from bed to go to the restroom and requires a moment to make the transition.  Headache is much improved from 9/10 to 6/10 with oral sumatriptan last night and this morning.  Patient has not had a BM since admission.  No SOB.  Facial pain is improved as well.  Objective: Vital signs in last 24 hours: Filed Vitals:   12/15/12 0015 12/15/12 0030 12/15/12 0607 12/15/12 0925  BP: 108/68 111/70 132/68 129/69  Pulse: 61 62 73 73  Temp: 98.7 F (37.1 C) 98.7 F (37.1 C) 98.1 F (36.7 C)   TempSrc: Oral Oral Oral   Resp: 18 18 18 20   Height:      Weight:      SpO2:   100% 98%   Weight change:   Intake/Output Summary (Last 24 hours) at 12/15/12 1112 Last data filed at 12/15/12 1000  Gross per 24 hour  Intake    378 ml  Output    950 ml  Net   -572 ml   General: Not in acute distress, resting comfortably in bed HEENT: PERRL, no scleral icterus Cardiac: S1/S2, RRR, 2/6 of systolic murmur heart over RUSB, No gallops or rubs  Pulm: Good air movement bilaterally. Faint expiratory wheezes heard best in lower lung fields.  No increased work of breathing.  Abd: Soft, nondistended, nontender, no rebound pain, no organomegaly, BS present  Ext: Mild swelling in left lower extremity just above ankle. Tenderness to palpation over the left ankle and calf. No edema of right lower extremity.  Skin: No rashes.  Neuro: Alert and oriented X3 Psych: Patient is not psychotic  Lab Results: Basic Metabolic Panel:  Recent Labs  40/98/11 0943 12/15/12 0615    NA 141 141  K 3.5 3.5  CL 106 107  CO2 27 25  GLUCOSE 96 84  BUN 14 9  CREATININE 0.87 0.85  CALCIUM 9.1 9.1   Liver Function Tests:  Recent Labs  12/15/12 0615  AST 18  ALT 10  ALKPHOS 59  BILITOT 0.5  PROT 6.6  ALBUMIN 3.5   No results found for this basename: LIPASE, AMYLASE,  in the last 72 hours No results found for this basename: AMMONIA,  in the last 72 hours CBC:  Recent Labs  12/14/12 1656 12/14/12 2054 12/15/12 0615  WBC  --  3.8* 3.9*  NEUTROABS 2.4  --   --   HGB  --  6.2* 8.1*  HCT  --  18.8* 24.2*  MCV  --  86.2 86.1  PLT  --  248 263   Cardiac Enzymes:  Recent Labs  12/14/12 1439  TROPONINI <0.30   BNP:  Recent Labs  12/14/12 1439  PROBNP 646.7*   D-Dimer: No results found for this basename: DDIMER,  in the last 72 hours CBG: No results found for this basename: GLUCAP,  in the last 72 hours Hemoglobin A1C: No results found for this basename: HGBA1C,  in the last 72 hours Fasting Lipid Panel: No results found for this basename: CHOL, HDL, LDLCALC, TRIG, CHOLHDL, LDLDIRECT,  in the  last 72 hours Thyroid Function Tests: No results found for this basename: TSH, T4TOTAL, FREET4, T3FREE, THYROIDAB,  in the last 72 hours Anemia Panel:  Recent Labs  12/14/12 1439 12/15/12 0615  VITAMINB12 426  --   FOLATE 16.5  --   FERRITIN 11  --   TIBC 352  --   IRON 13*  --   RETICCTPCT 2.3 2.3   Coagulation:  Recent Labs  12/14/12 0943 12/15/12 0615  LABPROT  --  24.1*  INR 2.8 2.24*   Urine Drug Screen: Drugs of Abuse  No results found for this basename: labopia, cocainscrnur, labbenz, amphetmu, thcu, labbarb    Alcohol Level: No results found for this basename: ETH,  in the last 72 hours Urinalysis: No results found for this basename: COLORURINE, APPERANCEUR, LABSPEC, PHURINE, GLUCOSEU, HGBUR, BILIRUBINUR, KETONESUR, PROTEINUR, UROBILINOGEN, NITRITE, LEUKOCYTESUR,  in the last 72 hours Misc. Labs:   Micro Results: No  results found for this or any previous visit (from the past 240 hour(s)). Studies/Results: Dg Chest 2 View  12/14/2012   *RADIOLOGY REPORT*  Clinical Data: Hemoptysis  CHEST - 2 VIEW  Comparison: December 02, 2012.  Findings: Cardiomediastinal silhouette appears normal.  No acute pulmonary disease is noted.  Bony thorax is intact.  IMPRESSION: No acute cardiopulmonary abnormality seen.   Original Report Authenticated By: Lupita Raider.,  M.D.   Medications: I have reviewed the patient's current medications. Scheduled Meds:  citalopram  40 mg Oral Daily   pantoprazole (PROTONIX) IV  40 mg Intravenous Q12H   sodium chloride  3 mL Intravenous Q12H   sucralfate  1 g Oral QID   Warfarin - Pharmacist Dosing Inpatient   Does not apply q1800   Continuous Infusions:  dextrose 5 % and 0.9% NaCl     PRN Meds:.HYDROcodone-acetaminophen, ondansetron (ZOFRAN) IV, SUMAtriptan Assessment/Plan: Principal Problem:   Anemia Active Problems:   VENOUS INSUFFICIENCY   HTN (hypertension)   Aortic stenosis   Barrett's esophagus   Other specified anemias Principal Problem:  Anemia  Active Problems:  VENOUS INSUFFICIENCY  HTN (hypertension)  Aortic stenosis  Barrett's esophagus  Other specified anemias   #: Symptomatic anemia. Etiology is not clear now. Hb drop of almost 4 over 11 days.  Dr. Jenne Pane (ENT) scoped patient yesterday and saw no bleeding, feel that GI source is more likely.  Black stools and spitting of blood suggest likely upper GI source of bleeding.  No evidence of hemolysis on labs.  Hb 8.1 today after transfusion of one unit on 8/21. - Appreciate ENT consultation in managing our patient.  Troponins negative x1. - GI saw patient this am, awaiting their recommendations - continue q8h CBC to monitor Hgb level.   - Protonix 40 mg bid IV and sucralfate  - Hold Lasix and Amlodipine     # Hypertension. bp is 129/69.  Amlodipine and furosemide are being held for hypotension on admission.   -will continue to hold antihypertensives in setting of anemia and concerns for bleeding.   # DVT - Patient was diagnosed with DVT 3 months ago and is on coumadin therapy. inr 2.24 today. Lower left leg is still warm and tender to palpation. Dr Josem Kaufmann suggested to continue coumadin.  - will continue coumadin per pharmacy consult - pt instructed to take today's dose of coumadin. - venous doppler to evaluate DVT  # Headache - Patient has hx of migraines. She took tramadol at home without help.  - continue Sumitriptan 25 mg bid   #  DVT PPx: On coumadin for current DVT. Last INR on admission 8/21 = 2.8. Patient currently therapeutic, no need to anticoagulate.  -Continue to closely monitor INR.  # F/E/N  -100/hr NS while NPO -Monitor electrolytes by checking BMP  -NPO  Dispo: Disposition is deferred at this time, awaiting improvement of current medical problems. Anticipated discharge in approximately 2 to 3 day(s).  The patient does have a current PCP (LI, NA, MD), therefore is requiring OPC follow-up after discharge.  The patient does not have transportation limitations that hinder transportation to clinic appointments.  Signed:  Ivonne Andrew MS4 Pager [phone]  This is a Medical Student Note.  The care of the patient was discussed with Dr. Meredith Pel and the assessment and plan formulated with their assistance.  Please see their attached note for official documentation of the daily encounter.   LOS: 1 day   [facility] Provider Link Found] 12/15/2012, 11:12 AM

## 2012-12-15 NOTE — Consult Note (Signed)
[facility] Consult: 10:18 AM 12/15/2012   Referring Provider: teaching service dr Meredith Pel Primary Care Physician:  Dede Query, MD Primary Gastroenterologist:  Dr. Arlyce Dice  Reason for Consultation:  GI bleed HPI: Sherri Mcmillan is a 79 y.o. female.   Started on Coumadin in June for DVT.   Hgb was 7.9 on after she suffered a fall from tripping (denies presyncope).  Known GERD.  Stricture at GE junction and 4 cm HH on EGD in 08/2012.  Takes Nexium 40 mg and Carafate 4 times daily.  Seen in GI office 8/19 for 2 weeks of spitting up blood, no nausea or true vomiting, more just regurgitation. Eating well.  BMs mostly brown but single episode of a black stool on Sunday 8/17.  Next BM on 8/19 was green.  She declined rectal exam at office.   Labs at office on 8/19 showed Hgb of 6.6.  Office unable to reach pt until AM of 8/21 and she was advised to go to ED.  Repeat Hgb at 6.2.  S/p one unit PRBC and Hgb up to 8.1 this AM.   Pt 2.3 yesterday PM, 2.8 today.  Has not gotten Vit K or FFP On BID Protonix IV.  Had fiberoptic Laryngoscopy yest afternoon:  This did not reveal any active bleeding or source for bleeding.  Dr Arlyce Dice had suggested CT abdomen to rule out RP bleed given her recent fall.  No CT yet obtained.   Admits to progressive weakness, poor balance, dizziness for about 10 days.  No further black or bloody stools. No nausea, no abdominal pain. Her reflux is no worse or better than it is historicaly.      Past Medical History  Diagnosis Date  . Chronic pain     DJD knees, back, migranes, LLE varicose veins, and LLE neuropathy.   . Aortic stenosis     Dx ECHO 2008 , mild and aymptomatic , needs ECHO q 3-5 yrs  . Barrett's esophagus     Demonstrated on EGD 12/2010. EGD 09/17/2012 shows inflamed GE junctional mucosa without metaplasia, dysplasia, or malignancy.  . Chronic insomnia   . Chronic anxiety     Admission to [facility] (90s or early 2000)  for  6 weeks after mother died. Complicated again by death of her sister 2010. 2012 developed hallucinations and I refused to refill controlled meds unless she see psych which she is not agreable to  . Elevated cholesterol 10/11    LDL 155. Her 10 year risk (decreasing her age to 32 as the calculator won't go to age 38) is 21% ish. If consider her non smoker (which I wouldn't even though she says 1 pak lasts 2 weeks) risk is 12% ish. So goal for LDL is 130 or 100.  Marland Kitchen HTN (hypertension)     Controlled with 2 drug therapy  . Migraine   . Cataract   . Depression   . Gastroparesis     Demonstrated on GES 12/2010 by Dr Dalene Seltzer  . Bronchitis     hx of  . Shortness of breath   . Left leg DVT 09/28/2012  . Anemia 12/14/2012    Past Surgical History  Procedure Laterality Date  . Direct laryngoscopy   September 2008     preoperative diagnosis hoarseness with anterior right vocal cord lesion -  direct laryngoscopy and excisional biopsy of right anterior vocal cord lesion done by Dr. Ezzard Standing  . Meniscectomy   July 2002     preoperative diagnosis torn medial  meniscus right knee, partial medial meniscectomy, debridement chondroplasty patellofemoral joint, done by Dr. Madelon Lips  . Abdominal hysterectomy    . Back surgery    . Hemorrhoid surgery    . Appendectomy    . Cataract extraction      left eye  . Polypectomy  2000    Dr.Magod  . Colonoscopy  2000&2005    Dr.Magod  . Esophagogastroduodenoscopy  11/2010  . Knee arthroscopy    . Eye surgery    . Rotator cuff repair  09/21/2011    rt shoulder  . Esophagogastroduodenoscopy N/A 09/17/2012    Procedure: ESOPHAGOGASTRODUODENOSCOPY (EGD);  Surgeon: Hart Carwin, MD;  Location[facility] ENDOSCOPY;  Service: Endoscopy;  Laterality: N/A;    Prior to Admission medications   Medication Sig Start Date End Date Taking? Authorizing Provider  amLODipine (NORVASC) 10 MG tablet Take 1 tablet (10 mg total) by mouth daily. 09/28/12  Yes Na Li, MD  citalopram (CELEXA) 40  MG tablet Take 40 mg by mouth daily.   Yes Historical Provider, MD  furosemide (LASIX) 40 MG tablet Take 1.5 tablets (60 mg total) by mouth daily. 09/28/12  Yes Dede Query, MD  HYDROcodone-acetaminophen (NORCO/VICODIN) 5-325 MG per tablet Take 1 tablet by mouth every 4 (four) hours as needed for pain. 12/02/12  Yes Kristen N Ward, DO  omeprazole-sodium bicarbonate (ZEGERID) 40-1100 MG per capsule Take 1 capsule by mouth daily before breakfast. 12/12/12  Yes Jessica D. Zehr, PA-C  promethazine (PHENERGAN) 12.5 MG tablet Take 1 tablet (12.5 mg total) by mouth every 6 (six) hours as needed for nausea. 09/28/12  Yes Na Li, MD  sucralfate (CARAFATE) 1 G tablet Take 1 tablet (1 g total) by mouth 4 (four) times daily. 09/28/12 09/28/13 Yes Na Li, MD  warfarin (COUMADIN) 5 MG tablet Take 2.5-5 mg by mouth daily. Take 2.5 mg on  Monday, and Thursday.  Take 5 mg on Tuesday, Wednesday, Friday, Saturday, and Sunday   Yes Historical Provider, MD    Scheduled Meds: . citalopram  40 mg Oral Daily  . pantoprazole (PROTONIX) IV  40 mg Intravenous Q12H  . sodium chloride  3 mL Intravenous Q12H  . sucralfate  1 g Oral QID  . Warfarin - Pharmacist Dosing Inpatient   Does not apply q1800   Infusions:   PRN Meds: HYDROcodone-acetaminophen, ondansetron (ZOFRAN) IV, SUMAtriptan   Allergies as of 12/14/2012 - Review Complete 12/14/2012  Allergen Reaction Noted  . Milk-related compounds  09/16/2012  . Aspirin Rash 07/06/2006    Family History  Problem Relation Age of Onset  . Stroke Neg Hx   . Cancer Neg Hx   . Colon cancer Neg Hx   . Anesthesia problems Neg Hx   . Hypotension Neg Hx   . Malignant hyperthermia Neg Hx   . Pseudochol deficiency Neg Hx   . Heart disease Mother   . Hypertension Mother     History   Social History  . Marital Status: Single    Spouse Name: N/A    Number of Children: N/A  . Years of Education: N/A   Occupational History  . Not on file.   Social History Main Topics  . Smoking  status: Former Smoker    Types: Cigarettes    Quit date: 06/01/2010  . Smokeless tobacco: Never Used  . Alcohol Use: No  . Drug Use: No  . Sexual Activity: No   Other Topics Concern  . 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 No head ache.  No vision deficits No joint pain.  No NSAIDs No rash, sores, itching   PHYSICAL EXAM: Vital signs in last 24 hours: Temp:  [97.7 F (36.5 C)-98.8 F (37.1 C)] 98.1 F (36.7 C) (08/22 0607) Pulse Rate:  [56-76] 73 (08/22 0607) Resp:  [18] 18 (08/22 0607) BP: (105-148)/(56-90) 132/68 mmHg (08/22 0607) SpO2:  [99 %-100 %] 100 % (08/22 0607) Weight:  [60.5 kg (133 lb 6.1 oz)-62.3 kg (137 lb 5.6 oz)] 62.3 kg (137 lb 5.6 oz) (08/21 2137)  General: pleasant elerly AAF.  Comfortable and not ill looking Head:  No trauma signs.  No edema  Eyes:  No icterus or pallor Ears:  Not HOH  Nose:  No discharge or bleeding Mouth:  Clear and moist MM.  No blood Neck:  No Mass, no TMG, no bruits Lungs:  Clear bil.  No cough or resp distress.  Heart: RRR.  No MRG Abdomen:  Soft, NT.  No mass or HSM.  No bruits, no masses.   Rectal: not done   Musc/Skeltl: no joint swelling or deformity Extremities:  No pedal edema  Neurologic:  Pleasant, a & o x 3.  No tremor Skin:  No sores, no rash Tattoos:  none Nodes:  No inguinal adenopathy   Psych:  Pleasant, cooperative,  Relaxed and in good spirits.   Intake/Output from previous day: 08/21 0701 - 08/22 0700 In: 378 [I.V.:100; Blood:278] Out: 800 [Urine:800] Intake/Output this shift:    LAB RESULTS:  Recent Labs  12/14/12 1439  12/14/12 2054 12/15/12 0615  WBC 4.2 3.8* 3.9*  HGB 7.0* 6.2* 8.1*  HCT 20.6* 18.8* 24.2*  PLT 269 248 263   BMET Lab Results  Component Value Date   NA 141 12/15/2012   NA 141 12/14/2012   NA 140 12/02/2012   K 3.5 12/15/2012   K 3.5 12/14/2012   K 3.5 12/02/2012   CL 107 12/15/2012   CL 106 12/14/2012   CL 101 12/02/2012   CO2 25 12/15/2012   CO2 27 12/14/2012   CO2 29 12/02/2012   GLUCOSE 84 12/15/2012   GLUCOSE 96 12/14/2012   GLUCOSE 94 12/02/2012   BUN 9 12/15/2012   BUN 14 12/14/2012   BUN 24* 12/02/2012   CREATININE 0.85 12/15/2012   CREATININE 0.87 12/14/2012   CREATININE 0.86 12/02/2012   CALCIUM 9.1 12/15/2012   CALCIUM 9.1 12/14/2012   CALCIUM 9.3 12/02/2012   LFT  Recent Labs  12/15/12 0615  PROT 6.6  ALBUMIN 3.5  AST 18  ALT 10  ALKPHOS 59  BILITOT 0.5   PT/INR Lab Results  Component Value Date   INR 2.24* 12/15/2012   INR 2.8 12/14/2012   INR 2.10 12/11/2012    RADIOLOGY STUDIES: Dg Chest 2 View 12/14/2012     IMPRESSION: No acute cardiopulmonary abnormality seen.   Original Report Authenticated By: Lupita Raider.,  M.D.    ENDOSCOPIC STUDIES: 08/2012 EGD ENDOSCOPIC IMPRESSION:  1. Stricture was found at the gastroesophageal junction - appears  benign, reflux  related  2. Esophagitis consistent with reflux esophagitis in the lower  third of the esophagus; biopsy  3. 4 cm hiatal hernia  4. The duodenal mucosa showed no abnormalities in the bulb and  second portion of the duodenum  RECOMMENDATIONS:  1. Await pathology results  2. Anti-reflux regimen to be follow  3. Continue PPI  10/2010  EGD Irregular Z line.   08/2010 Colonoscopy for his adenomatous polyp in 2005  Normal study, no new polyps. 10 year follow up  10/2010 EGD for dyspepsia  Irregular Z line. Pathology showed Barretts. No H Pylori.   11/2010 GES  Delayed gastric emptying at 2 hours 49% retention of gastric contents.    IMPRESSION: *  Acute anemia.  S/p one PRBC *  Hematemesis vs hemoptysis.   No source for bleeding on 8/21 laryngoscopy *  DVT 09/2012, on Coumadin since.  Presently on hold.  INR therapeutic but too high for any intervention *  Recent fall.  *  Hx Barretts esophagus .  On daily carafate and nexium with breakthrough GERD *  Hx gastroparesis.   PLAN: *  Allow INR to drift vs giving low dose vitamin K (1 mg ?) but first you will need to discontinue the Coumadin.  This seemingly is still and active drug on the drug list.  *  EGD in next 24 to 48 hours.  *  Does she need CT abd pelvis to rule out RP bleed? *  Full liquid diet *  Po protonix is fine.     LOS: 1 day   Jennye Moccasin  12/15/2012, 10:18 AM Pager: [phone]

## 2012-12-15 NOTE — Progress Notes (Signed)
   CARE MANAGEMENT NOTE 12/15/2012  Patient:  Sherri Mcmillan, Sherri Mcmillan   Account Number:  [id number]  Date Initiated:  12/15/2012  Documentation initiated by:  Jiles Crocker  Subjective/Objective Assessment:   ADMITTED WITH ANEMIA, DIZZINESS     Action/Plan:   PCP:  Dede Query, MD  LIVES ALONE; CM FOLLOWING FOR DCP   Anticipated DC Date:  12/22/2012   Anticipated DC Plan:  HOME/SELF CARE      DC Planning Services  CM consult         Status of service:  In process, will continue to follow Medicare Important Message given?  NA - LOS <3 / Initial given by admissions (If response is "NO", the following Medicare IM given date fields will be blank)  Per UR Regulation:  Reviewed for med. necessity/level of care/duration of stay Comments:  12/15/2012- B  RN,BSN,MHA

## 2012-12-15 NOTE — Progress Notes (Signed)
*  PRELIMINARY RESULTS* Vascular Ultrasound Left lower extremity venous duplex  has been completed.  Preliminary findings: no evidence of DVT.   Farrel Demark, RDMS, RVT  12/15/2012, 2:39 PM

## 2012-12-15 NOTE — Progress Notes (Addendum)
Subjective:  - Patient feels fine. Mild dizziness. No nausea or abdominal pain.  - Hgb 8.1 after received 1 unit of blood - No bloody stool (Patient has not had a BM since admission)  - Headache improved with sumatriptan. - Repeated LE doppler: Preliminary findings showed no evidence of DVT.  - INR: 2.24  Recent Labs Lab 12/11/12 1029 12/14/12 0943 12/15/12 0615  INR 2.10 2.8 2.24*   Objective: Vital signs in last 24 hours: Filed Vitals:   12/15/12 0030 12/15/12 0607 12/15/12 0925 12/15/12 1335  BP: 111/70 132/68 129/69 102/58  Pulse: 62 73 73 74  Temp: 98.7 F (37.1 C) 98.1 F (36.7 C)  98.8 F (37.1 C)  TempSrc: Oral Oral  Oral  Resp: 18 18 20 20   Height:      Weight:      SpO2:  100% 98% 98%   Weight change:   Intake/Output Summary (Last 24 hours) at 12/15/12 1507 Last data filed at 12/15/12 1300  Gross per 24 hour  Intake    618 ml  Output    950 ml  Net   -332 ml    General: Not in acute distress, resting comfortably in bed  HEENT: PERRL, no scleral icterus  Cardiac: S1/S2, RRR, 2/6 of systolic murmur over aortic valve area. Pulm: Good air movement bilaterally. Faint expiratory wheezes heard best in lower lung fields. No increased work of breathing.  Abd: Soft, nondistended, nontender, no rebound pain, no organomegaly, BS present  Ext: Mild swelling in left lower extremity. Mild tenderness over the left ankle and calf. No edema of right lower extremity. 2+DP/PT pulse bilaterally  Musculoskeletal: No joint deformities, erythema, or stiffness, ROM full Skin: No rashes.  Neuro: Alert and oriented X3  Physical Exam:   Filed Vitals:   12/15/12 0030 12/15/12 0607 12/15/12 0925 12/15/12 1335  BP: 111/70 132/68 129/69 102/58  Pulse: 62 73 73 74  Temp: 98.7 F (37.1 C) 98.1 F (36.7 C)  98.8 F (37.1 C)  TempSrc: Oral Oral  Oral  Resp: 18 18 20 20   Height:      Weight:      SpO2:  100% 98% 98%    .nxlpe  Lab Results: Basic Metabolic Panel:  Recent  Labs Lab 12/14/12 0943 12/15/12 0615  NA 141 141  K 3.5 3.5  CL 106 107  CO2 27 25  GLUCOSE 96 84  BUN 14 9  CREATININE 0.87 0.85  CALCIUM 9.1 9.1   Liver Function Tests:  Recent Labs Lab 12/15/12 0615  AST 18  ALT 10  ALKPHOS 59  BILITOT 0.5  PROT 6.6  ALBUMIN 3.5   No results found for this basename: LIPASE, AMYLASE,  in the last 168 hours No results found for this basename: AMMONIA,  in the last 168 hours CBC:  Recent Labs Lab 12/14/12 1656  12/15/12 0615 12/15/12 1130  WBC  --   < > 3.9* 4.1  NEUTROABS 2.4  --   --   --   HGB  --   < > 8.1* 8.1*  HCT  --   < > 24.2* 23.9*  MCV  --   < > 86.1 84.5  PLT  --   < > 263 252  < > = values in this interval not displayed. Cardiac Enzymes:  Recent Labs Lab 12/14/12 1439  TROPONINI <0.30   BNP:  Recent Labs Lab 12/14/12 1439  PROBNP 646.7*   D-Dimer: No results found for this basename: DDIMER,  in the last 168 hours CBG: No results found for this basename: GLUCAP,  in the last 168 hours Hemoglobin A1C: No results found for this basename: HGBA1C,  in the last 168 hours Fasting Lipid Panel: No results found for this basename: CHOL, HDL, LDLCALC, TRIG, CHOLHDL, LDLDIRECT,  in the last 168 hours Thyroid Function Tests: No results found for this basename: TSH, T4TOTAL, FREET4, T3FREE, THYROIDAB,  in the last 168 hours Coagulation:  Recent Labs Lab 12/11/12 1029 12/14/12 0943 12/15/12 0615  LABPROT  --   --  24.1*  INR 2.10 2.8 2.24*   Anemia Panel:  Recent Labs Lab 12/14/12 1439 12/15/12 0615  VITAMINB12 426  --   FOLATE 16.5  --   FERRITIN 11  --   TIBC 352  --   IRON 13*  --   RETICCTPCT 2.3 2.3   Urine Drug Screen: Drugs of Abuse  No results found for this basename: labopia, cocainscrnur, labbenz, amphetmu, thcu, labbarb    Alcohol Level: No results found for this basename: ETH,  in the last 168 hours Urinalysis: No results found for this basename: COLORURINE, APPERANCEUR,  LABSPEC, PHURINE, GLUCOSEU, HGBUR, BILIRUBINUR, KETONESUR, PROTEINUR, UROBILINOGEN, NITRITE, LEUKOCYTESUR,  in the last 168 hours Misc. Labs:  Micro Results: No results found for this or any previous visit (from the past 240 hour(s)). Studies/Results: Dg Chest 2 View  12/14/2012   *RADIOLOGY REPORT*  Clinical Data: Hemoptysis  CHEST - 2 VIEW  Comparison: December 02, 2012.  Findings: Cardiomediastinal silhouette appears normal.  No acute pulmonary disease is noted.  Bony thorax is intact.  IMPRESSION: No acute cardiopulmonary abnormality seen.   Original Report Authenticated By: Lupita Raider.,  M.D.   Medications:  Scheduled Meds: . citalopram  40 mg Oral Daily  . pantoprazole  40 mg Oral BID AC  . phytonadione (VITAMIN K) IV  1 mg Intravenous Once  . sodium chloride  3 mL Intravenous Q12H  . sucralfate  1 g Oral QID   Continuous Infusions:  PRN Meds:.HYDROcodone-acetaminophen, ondansetron (ZOFRAN) IV, SUMAtriptan Assessment/Plan:  Addendum:  For clarification of anemia  Patient's anemia is most likely due to acute blood loss on chronic anemia. The etiology is considered to be GI bleeding. Currently planing to do EGD by GI.   #: Symptomatic anemia.  Etiology is not clear now. Her Hgb decreased from 10.3 on 12/02/2012 to 6.7 8/21. One possibility is hemoptysis.  ENT was consulted. Dr. Jenne Pane performed the fiberoptic laryngoscope , which showed no blood or bleeding source. Dr. Jenne Pane recommended GI consultation. Another positivity is GI bleeding given her hx of possible ulcers and recent black stool. GI consulted, plan to do EGD when INR 1.5 or less. Patient's Hgb is 8.1 after received 1 unit of blood. It is unlikely that patient has hemolysis given normal haptoglobin and total bilirubin.   - Appreciate ENT and consultation in managing our patient.   - Will give 1 mg IV Vk to reverse INR  - q8h CBC to monitor Hgb level, will transfuse if Hgb<7.0.   - Zofran for nausea - switch IV Protonix 40  mg bid to oral and continue sucralfate - Hold Lasix and Amlodipine (bp 102/58 mmHg)  # Hypertension. bp is normal today.   -will hold antihypertensives in setting of anemia and concerns for bleeding.  # DVT - Patient was diagnosed with DVT 2 months ago and is on coumadin therapy. inr 2.24. Lower left leg is still warm and tender to palpation, but repeat LE  doppler did not show no evidence of DVT on preliminary report.  -will d/c coumadin for possible EGD by GI - will give 1 mg Vk by IV to reverse INR.  # Headache - Patient has hx of migraines. She took tramadol at home without help. She responded to low dose of sumatriptan - continue Sumitriptan 25 mg bid prn  # DVT PPx:   INR 2.24.  -Continue to closely monitor INR after discontinuation of coumadin and one dose of Vk.  #  F/E/N   -SL -Monitor electrolytes by checking BMP -full liquid and NPO after MN      LOS: 1 day   Lorretta Harp 12/15/2012, 3:07 PM

## 2012-12-15 NOTE — Clinical Documentation Improvement (Signed)
THIS DOCUMENT IS NOT A PERMANENT PART OF THE MEDICAL RECORD  Please update your documentation with the medical record to reflect your response to this query. If you need help knowing how to do this please call [phone].  12/15/12  Dear Dr. Meredith Pel Marton Redwood  In an effort to better capture your patient's severity of illness, reflect appropriate length of stay and utilization of resources, a review of the patient medical record has revealed the following indicators.    Based on your clinical judgment, please clarify and document in a progress note and/or discharge summary the clinical condition associated with the following supporting information:  In responding to this query please exercise your independent judgment.  The fact that a query is asked, does not imply that any particular answer is desired or expected.  Possible Clinical Conditions?   Acute Blood Loss Anemia   Supporting Information: Risk Factors: (As per notes)  "ANEMIA"  Signs and Symptoms (As per notes)"She is admitted for blood transfusion and because of acute anemia."  "Likely due to blood loss, patient describes dark stools recently as well as spitting frank blood. Another possibility for GI blood loss would be AS induced von Willbrand disease (Heyde's Syndrome).   Reviewed: additional documentation in the medical record   Thank You,  Nevin Bloodgood RN, BSN, CCDS, Clinical Documentation Specialist: [phone] Cell= [phone] Health Information Management [place]

## 2012-12-15 NOTE — Progress Notes (Signed)
Internal Medicine Attending  Date: 12/15/2012  Patient name: Sherri Mcmillan Medical record number: [id number] Date of birth: [birth year] Age: 79 y.o. Gender: female  I saw and evaluated the patient. I reviewed the resident's note by Dr. Clyde Lundborg and I agree with the resident's findings and plans as documented in his note, with the following additional comments.  Patient is on warfarin for treatment of acute deep vein thrombosis involving the soleal vein of the left lower extremity seen on duplex study done in 09/28/2012; superficial thrombophlebitis was also then noted in multiple varicosities of the left posterior calf.  Patient has now completed approximately 11 weeks of a planned 3 month course of anticoagulation for the left calf vein DVT.  She has some tender areas on her left medial posterior calf which likely represent superficial thrombophlebitis; lower extremity duplex study today shows no DVT.  Given her blood loss anemia, I think it is reasonable to stop her warfarin at this time and reverse her INR for EGD; would follow closely for any signs or symptoms of recurrent DVT.

## 2012-12-15 NOTE — Evaluation (Signed)
Physical Therapy Evaluation Patient Details Name: Sherri Mcmillan MRN: [id number] DOB: February 28, 1933 Today's Date: 12/15/2012 Time: 9528-4132 PT Time Calculation (min): 16 min  PT Assessment / Plan / Recommendation History of Present Illness  Patient is a 79 yo female admitted with dizziness, weakness, and poor balance x10 days.  Patient with anemia and hypotension.  Patient with h/o DVT LLE 3 months ago.  Clinical Impression  Patient presents with problems listed below.Will benefit from acute PT to maximize independence prior to discharge home.  Patient will not have 24 hour assist.  Recommend HHPT for continued therapy and HH aide for ADL assist at discharge.    PT Assessment  Patient needs continued PT services    Follow Up Recommendations  Home health PT; Home Health Aide; Supervision - Intermittent    Does the patient have the potential to tolerate intense rehabilitation      Barriers to Discharge Decreased caregiver support Staying with friend at discharge.  Friend works during day.    Equipment Recommendations  None recommended by PT    Recommendations for Other Services     Frequency Min 3X/week    Precautions / Restrictions Precautions Precautions: Fall Restrictions Weight Bearing Restrictions: No   Pertinent Vitals/Pain       Mobility  Bed Mobility Bed Mobility: Supine to Sit;Sit to Supine Supine to Sit: 6: Modified independent (Device/Increase time);With rails;HOB elevated Sit to Supine: 6: Modified independent (Device/Increase time);With rail;HOB elevated Details for Bed Mobility Assistance: Verbal cues for technique.  No assist needed. Transfers Transfers: Sit to Stand;Stand to Sit Sit to Stand: 4: Min assist;With upper extremity assist;From bed Stand to Sit: 4: Min guard;With upper extremity assist;To bed Details for Transfer Assistance: Verbal cues for hand placement.  Assist to rise to standing and for standing balance. Ambulation/Gait Ambulation/Gait  Assistance: 4: Min assist;4: Min guard Ambulation Distance (Feet): 160 Feet Assistive device: None;Rolling walker Ambulation/Gait Assistance Details: Patient ambulated 30' with no assistive device.  Patient required min assist due to decreased balance.  Patient then used RW.  Required min guard assist.  Balance improved.  Patient required 2 standing rest breaks due to dyspnea, fatigue.  Continues to report dizziness and feeling "off balance" even with RW. Gait Pattern: Step-through pattern;Decreased stride length;Ataxic;Trunk flexed Gait velocity: Slow gait speed    Exercises     PT Diagnosis: Difficulty walking;Abnormality of gait;Generalized weakness  PT Problem List: Decreased strength;Decreased activity tolerance;Decreased balance;Decreased mobility;Decreased knowledge of use of DME;Cardiopulmonary status limiting activity PT Treatment Interventions: DME instruction;Gait training;Stair training;Functional mobility training;Balance training;Patient/family education     PT Goals(Current goals can be found in the care plan section) Acute Rehab PT Goals Patient Stated Goal: To go home PT Goal Formulation: With patient Time For Goal Achievement: 12/22/12 Potential to Achieve Goals: Good  Visit Information  Last PT Received On: 12/15/12 Assistance Needed: +1 History of Present Illness: Patient is a 79 yo female admitted with dizziness, weakness, and poor balance x10 days.  Patient with anemia and hypotension.  Patient with h/o DVT LLE 3 months ago.       Prior Functioning  Home Living Family/patient expects to be discharged to:: Private residence Living Arrangements: Alone Available Help at Discharge: Friend(s);Available PRN/intermittently (Is staying with friend at d/c.  Friend works.) Type of Home: House Home Access: Stairs to enter Secretary/administrator of Steps: 3 Entrance Stairs-Rails: None Home Layout: One level Home Equipment: Environmental consultant - 2 wheels;Bedside commode;Shower  seat Additional Comments: Patient staying with friend at discharge.  Friend works,  so patient will be at home alone during day. Prior Function Level of Independence: Independent Communication Communication: No difficulties    Cognition  Cognition Arousal/Alertness: Awake/alert Behavior During Therapy: WFL for tasks assessed/performed Overall Cognitive Status: Within Functional Limits for tasks assessed    Extremity/Trunk Assessment Upper Extremity Assessment Upper Extremity Assessment: Overall WFL for tasks assessed Lower Extremity Assessment Lower Extremity Assessment: Generalized weakness Cervical / Trunk Assessment Cervical / Trunk Assessment: Normal   Balance Balance Balance Assessed: Yes Static Sitting Balance Static Sitting - Balance Support: No upper extremity supported;Feet supported Static Sitting - Level of Assistance: 5: Stand by assistance Static Sitting - Comment/# of Minutes: 4 Static Standing Balance Static Standing - Balance Support: Bilateral upper extremity supported Static Standing - Level of Assistance: 4: Min assist Static Standing - Comment/# of Minutes: 2  End of Session PT - End of Session Equipment Utilized During Treatment: Gait belt Activity Tolerance: Patient limited by fatigue Patient left: in bed;with call bell/phone within reach;with family/visitor present Nurse Communication: Mobility status  GP     Vena Austria 12/15/2012, 7:13 PM Durenda Hurt. Renaldo Fiddler, [facility] Acute Rehab Services Pager [phone]

## 2012-12-15 NOTE — Progress Notes (Signed)
I have seen the patient and reviewed the daily progress note by medical student,  Josh and discussed the care of the patient with them. Please see my note for documentation of my findings, assessment, and plans.    , MD PGY3, Internal Medicine Teaching Service Pager: [phone]   

## 2012-12-15 NOTE — H&P (Signed)
Internal Medicine Attending Admission Note Date: 12/15/2012  Patient name: Sherri Mcmillan Medical record number: [id number] Date of birth: 1933-04-24 Age: 79 y.o. Gender: female  I saw and evaluated the patient. I reviewed the resident's note and I agree with the resident's findings and plan as documented in the resident's note.  Please see the attending admission note.

## 2012-12-15 NOTE — Consult Note (Signed)
Patient seen, examined, and I agree with the above documentation, including the assessment and plan. Pt gives hx of melena days ago.  No signs of active bleeding currently. INR therapeutic on warfarin Laryngoscopy unrevealing  EGD is reasonable given previous melena and drop in Hgb.  She has responded very nicely to 1u pRBC Will plan EGD when INR 1.5 or less, unless there are signs of acute bleeding, at which time I would recommend reversal with FFP Daily PPI Suspicion for RP bleed is very low in the absence of abd symptoms

## 2012-12-16 LAB — CBC
HCT: 25.3 % — ABNORMAL LOW (ref 36.0–46.0)
Hemoglobin: 8.6 g/dL — ABNORMAL LOW (ref 12.0–15.0)
MCH: 29.2 pg (ref 26.0–34.0)
MCHC: 34 g/dL (ref 30.0–36.0)

## 2012-12-16 LAB — BASIC METABOLIC PANEL
BUN: 7 mg/dL (ref 6–23)
CO2: 24 mEq/L (ref 19–32)
Chloride: 107 mEq/L (ref 96–112)
Creatinine, Ser: 0.82 mg/dL (ref 0.50–1.10)

## 2012-12-16 LAB — GLUCOSE, CAPILLARY: Glucose-Capillary: 124 mg/dL — ABNORMAL HIGH (ref 70–99)

## 2012-12-16 LAB — PROTIME-INR: INR: 1.89 — ABNORMAL HIGH (ref 0.00–1.49)

## 2012-12-16 MED ORDER — POTASSIUM CHLORIDE CRYS ER 20 MEQ PO TBCR
40.0000 meq | EXTENDED_RELEASE_TABLET | Freq: Once | ORAL | Status: AC
  Administered 2012-12-16: 40 meq via ORAL
  Filled 2012-12-16: qty 2

## 2012-12-16 MED ORDER — VITAMIN K1 10 MG/ML IJ SOLN
1.0000 mg | Freq: Once | INTRAMUSCULAR | Status: AC
  Administered 2012-12-16: 1 mg via INTRAVENOUS
  Filled 2012-12-16: qty 0.1

## 2012-12-16 MED ORDER — HEPARIN SODIUM (PORCINE) 5000 UNIT/ML IJ SOLN
5000.0000 [IU] | Freq: Once | INTRAMUSCULAR | Status: AC
  Administered 2012-12-16: 5000 [IU] via SUBCUTANEOUS
  Filled 2012-12-16: qty 1

## 2012-12-16 MED ORDER — FUROSEMIDE 40 MG PO TABS
40.0000 mg | ORAL_TABLET | Freq: Once | ORAL | Status: AC
  Administered 2012-12-16: 40 mg via ORAL
  Filled 2012-12-16: qty 1

## 2012-12-16 NOTE — Progress Notes (Signed)
Pt is crying and requested to have lt upper PIV removed. Pt stated that blood was  in the line and it is hurting. Pt is a hard stick and the  IV Nurse was called to assist. We provided education regarding PIV and the importance of having a PIV. Pt stopped crying after the  Removal of the PIV and verbalized understanding of same. We insert a new PIV asap.

## 2012-12-16 NOTE — Progress Notes (Signed)
Subjective:  - Patient feels fine. No nausea or abdominal pain.  - Hgb 8.6  - No bloody stool  - Repeated LE doppler: Preliminary findings showed no evidence of DVT.  - INR: 2.24-->1.89  Recent Labs Lab 12/11/12 1029 12/14/12 0943 12/15/12 0615 12/16/12 0430  INR 2.10 2.8 2.24* 1.89*   Objective: Vital signs in last 24 hours: Filed Vitals:   12/15/12 2040 12/16/12 0503 12/16/12 0955 12/16/12 1353  BP: 126/65 124/70 125/74 118/68  Pulse: 78 63 74 70  Temp: 99.5 F (37.5 C) 98.4 F (36.9 C) 98 F (36.7 C) 97.8 F (36.6 C)  TempSrc: Oral Oral Oral Oral  Resp: 18 18 18 18   Height: 5' (1.524 m)     Weight: 132 lb (59.875 kg)     SpO2: 100% 97% 96% 96%   Weight change: -1 lb 8 oz (-0.68 kg)  Intake/Output Summary (Last 24 hours) at 12/16/12 1728 Last data filed at 12/16/12 1300  Gross per 24 hour  Intake    480 ml  Output    775 ml  Net   -295 ml    General: Not in acute distress, resting comfortably in bed  HEENT: PERRL, no scleral icterus  Cardiac: S1/S2, RRR, 2/6 of systolic murmur over aortic valve area. Pulm: Good air movement bilaterally. Faint expiratory wheezes heard best in lower lung fields. No increased work of breathing.  Abd: Soft, nondistended, nontender, no rebound pain, no organomegaly, BS present  Ext: Mild swelling in left lower extremity. Mild tenderness over the left ankle and calf. No edema of right lower extremity. 2+DP/PT pulse bilaterally  Musculoskeletal: No joint deformities, erythema, or stiffness, ROM full Skin: No rashes.  Neuro: Alert and oriented X3  Physical Exam:   Filed Vitals:   12/15/12 2040 12/16/12 0503 12/16/12 0955 12/16/12 1353  BP: 126/65 124/70 125/74 118/68  Pulse: 78 63 74 70  Temp: 99.5 F (37.5 C) 98.4 F (36.9 C) 98 F (36.7 C) 97.8 F (36.6 C)  TempSrc: Oral Oral Oral Oral  Resp: 18 18 18 18   Height: 5' (1.524 m)     Weight: 132 lb (59.875 kg)     SpO2: 100% 97% 96% 96%    .nxlpe  Lab Results: Basic  Metabolic Panel:  Recent Labs Lab 12/15/12 0615 12/16/12 0430  NA 141 140  K 3.5 3.7  CL 107 107  CO2 25 24  GLUCOSE 84 85  BUN 9 7  CREATININE 0.85 0.82  CALCIUM 9.1 9.0   Liver Function Tests:  Recent Labs Lab 12/15/12 0615  AST 18  ALT 10  ALKPHOS 59  BILITOT 0.5  PROT 6.6  ALBUMIN 3.5   No results found for this basename: LIPASE, AMYLASE,  in the last 168 hours No results found for this basename: AMMONIA,  in the last 168 hours CBC:  Recent Labs Lab 12/14/12 1656  12/15/12 2056 12/16/12 0430  WBC  --   < > 5.5 4.0  NEUTROABS 2.4  --   --   --   HGB  --   < > 8.7* 8.6*  HCT  --   < > 26.0* 25.3*  MCV  --   < > 85.5 85.8  PLT  --   < > 275 291  < > = values in this interval not displayed. Cardiac Enzymes:  Recent Labs Lab 12/14/12 1439  TROPONINI <0.30   BNP:  Recent Labs Lab 12/14/12 1439  PROBNP 646.7*   D-Dimer: No results found for  this basename: DDIMER,  in the last 168 hours CBG: No results found for this basename: GLUCAP,  in the last 168 hours Hemoglobin A1C: No results found for this basename: HGBA1C,  in the last 168 hours Fasting Lipid Panel: No results found for this basename: CHOL, HDL, LDLCALC, TRIG, CHOLHDL, LDLDIRECT,  in the last 168 hours Thyroid Function Tests: No results found for this basename: TSH, T4TOTAL, FREET4, T3FREE, THYROIDAB,  in the last 168 hours Coagulation:  Recent Labs Lab 12/11/12 1029 12/14/12 0943 12/15/12 0615 12/16/12 0430  LABPROT  --   --  24.1* 21.1*  INR 2.10 2.8 2.24* 1.89*   Anemia Panel:  Recent Labs Lab 12/14/12 1439 12/15/12 0615  VITAMINB12 426  --   FOLATE 16.5  --   FERRITIN 11  --   TIBC 352  --   IRON 13*  --   RETICCTPCT 2.3 2.3   Urine Drug Screen: Drugs of Abuse  No results found for this basename: labopia,  cocainscrnur,  labbenz,  amphetmu,  thcu,  labbarb    Alcohol Level: No results found for this basename: ETH,  in the last 168 hours Urinalysis: No results  found for this basename: COLORURINE, APPERANCEUR, LABSPEC, PHURINE, GLUCOSEU, HGBUR, BILIRUBINUR, KETONESUR, PROTEINUR, UROBILINOGEN, NITRITE, LEUKOCYTESUR,  in the last 168 hours Misc. Labs:  Micro Results: No results found for this or any previous visit (from the past 240 hour(s)). Studies/Results: Dg Chest 2 View  12/14/2012   *RADIOLOGY REPORT*  Clinical Data: Hemoptysis  CHEST - 2 VIEW  Comparison: December 02, 2012.  Findings: Cardiomediastinal silhouette appears normal.  No acute pulmonary disease is noted.  Bony thorax is intact.  IMPRESSION: No acute cardiopulmonary abnormality seen.   Original Report Authenticated By: Lupita Raider.,  M.D.   Medications:  Scheduled Meds: . citalopram  40 mg Oral Daily  . heparin subcutaneous  5,000 Units Subcutaneous Once  . pantoprazole  40 mg Oral BID AC  . sodium chloride  3 mL Intravenous Q12H  . sucralfate  1 g Oral QID   Continuous Infusions:  PRN Meds:.HYDROcodone-acetaminophen, ondansetron (ZOFRAN) IV, SUMAtriptan Assessment/Plan:   #: Symptomatic anemia.  Etiology is not clear now. Most likely due to acute blood loss on chronic anemia. Her Hgb decreased from 10.3 on 12/02/2012 to 6.7 8/21. One possibility is hemoptysis.  ENT was consulted. Dr. Jenne Pane performed the fiberoptic laryngoscope , which showed no blood or bleeding source. Dr. Jenne Pane recommended GI consultation. Another positivity is GI bleeding given her hx of possible ulcers and recent black stool. GI consulted, plan to do EGD when INR 1.5 or less. Patient's Hgb has been stable after received 1 unit of blood.  It is unlikely that patient has hemolysis given normal haptoglobin and total bilirubin. INR=1.89  - Appreciate ENT and consultation in managing our patient.   - Will give 1 mg IV Vk to reverse INR  - daily CBC to monitor Hgb level, will transfuse if Hgb<7.0.   - Zofran for nausea - continue sucralfate and Protonix 40 mg bid to oral  - Hold Amlodipine (bp 102/58 mmHg). Will  give 40 mg lasix oral X 1 for hand swelling   # Hypertension. bp is 129/69. Amlodipine and furosemide are being held for hypotension on admission. Patient complains of hand swelling today and has crackles on pulmonary exam.  -will continue to hold antihypertensives in setting of anemia and concerns for bleeding.  -Furosemide 40mg  and reevaluate for improvement of edema.   # DVT -  Patient was diagnosed with DVT 11 weeks ago and started on coumadin therapy, CHEST guidelines recommend 3 months of treatment and reevaluation for distal DVT, repeat . INR 1.89 today after 1mg  Vitamin K IV yesterday. Lower left leg is still warm and tender to palpation.  - Warfarin d/c'd on 8/22  - 1mg  Vitamin K IV.   # Headache - Patient has hx of migraines. She took tramadol at home without help. She responded to low dose of sumatriptan - continue Sumitriptan 25 mg bid prn  # DVT PPx:   INR 1.89 -Continue to closely monitor INR after discontinuation of coumadin and one dose of Vk. -will give once dose of heparin today. Will start heparin after EGD procedure.  #  F/E/N   -SL -Monitor electrolytes by checking BMP -NPO after MN      LOS: 2 days   Lorretta Harp 12/16/2012, 5:28 PM

## 2012-12-16 NOTE — Progress Notes (Signed)
I have seen the patient and reviewed the daily progress note by medical student,  Josh and discussed the care of the patient with them. Please see my note for documentation of my findings, assessment, and plans.    , MD PGY3, Internal Medicine Teaching Service Pager: [phone]   

## 2012-12-16 NOTE — Progress Notes (Signed)
Sherri Mcmillan is a 79 y.o. woman with a PMH significant for DVT diagnosed 3 months ago for which she is taking coumadin, SOB, chronic migraine headaches, aortic stenosis, barrett's esophagus, dysphagia and HTN who was admitted on 8/21 for symptomatic anemia with a hemoglobin of 6.7 noted in clinic.  Subjective:  Patient reports feeling well this am (Hb 8.7 today).  Reports subjective swelling of hands as evidenced by her ring fitting more tightly.    Pain from headache is well controlled.  Patient had a BM yesterday (FOBT negatibe).  No SOB.  Facial pain continues to improve.  Objective: Vital signs in last 24 hours: Filed Vitals:   12/15/12 0925 12/15/12 1335 12/15/12 2040 12/16/12 0503  BP: 129/69 102/58 126/65 124/70  Pulse: 73 74 78 63  Temp:  98.8 F (37.1 C) 99.5 F (37.5 C) 98.4 F (36.9 C)  TempSrc:  Oral Oral Oral  Resp: 20 20 18 18   Height:   5' (1.524 m)   Weight:   59.875 kg (132 lb)   SpO2: 98% 98% 100% 97%   Weight change: -0.68 kg (-1 lb 8 oz)  Intake/Output Summary (Last 24 hours) at 12/16/12 0809 Last data filed at 12/15/12 1800  Gross per 24 hour  Intake    465 ml  Output    350 ml  Net    115 ml   General: Not in acute distress, resting comfortably in bed HEENT: PERRL, no scleral icterus Cardiac: Normal S1/S2, RRR, II/VI systolic murmur heart over RUSB, No gallops or rubs  Pulm: Good air movement bilaterally.  Some crackles heard throughout lung fields.  No increased work of breathing.  Abd: Soft, nondistended, nontender, no rebound pain, no organomegaly, BS present  Ext: Mild swelling, warmth and dark discoloration of left lower extremity just above ankle. Tenderness to palpation over the left ankle and calf with a palpable cord in the lower calf. No edema of right lower extremity.  Skin: No rashes.  Neuro: Alert and oriented X3 Psych: Patient is not psychotic  Lab Results: Basic Metabolic Panel:  Recent Labs  16/10/96 0615 12/16/12 0430  NA  141 140  K 3.5 3.7  CL 107 107  CO2 25 24  GLUCOSE 84 85  BUN 9 7  CREATININE 0.85 0.82  CALCIUM 9.1 9.0   Liver Function Tests:  Recent Labs  12/15/12 0615  AST 18  ALT 10  ALKPHOS 59  BILITOT 0.5  PROT 6.6  ALBUMIN 3.5   No results found for this basename: LIPASE, AMYLASE,  in the last 72 hours No results found for this basename: AMMONIA,  in the last 72 hours CBC:  Recent Labs  12/14/12 1656  12/15/12 2056 12/16/12 0430  WBC  --   < > 5.5 4.0  NEUTROABS 2.4  --   --   --   HGB  --   < > 8.7* 8.6*  HCT  --   < > 26.0* 25.3*  MCV  --   < > 85.5 85.8  PLT  --   < > 275 291  < > = values in this interval not displayed. Cardiac Enzymes:  Recent Labs  12/14/12 1439  TROPONINI <0.30   BNP:  Recent Labs  12/14/12 1439  PROBNP 646.7*   D-Dimer: No results found for this basename: DDIMER,  in the last 72 hours CBG: No results found for this basename: GLUCAP,  in the last 72 hours Hemoglobin A1C: No results found for this basename:  HGBA1C,  in the last 72 hours Fasting Lipid Panel: No results found for this basename: CHOL, HDL, LDLCALC, TRIG, CHOLHDL, LDLDIRECT,  in the last 72 hours Thyroid Function Tests: No results found for this basename: TSH, T4TOTAL, FREET4, T3FREE, THYROIDAB,  in the last 72 hours Anemia Panel:  Recent Labs  12/14/12 1439 12/15/12 0615  VITAMINB12 426  --   FOLATE 16.5  --   FERRITIN 11  --   TIBC 352  --   IRON 13*  --   RETICCTPCT 2.3 2.3   Coagulation:  Recent Labs  12/15/12 0615 12/16/12 0430  LABPROT 24.1* 21.1*  INR 2.24* 1.89*   Urine Drug Screen: Drugs of Abuse  No results found for this basename: labopia,  cocainscrnur,  labbenz,  amphetmu,  thcu,  labbarb    Alcohol Level: No results found for this basename: ETH,  in the last 72 hours Urinalysis: No results found for this basename: COLORURINE, APPERANCEUR, LABSPEC, PHURINE, GLUCOSEU, HGBUR, BILIRUBINUR, KETONESUR, PROTEINUR, UROBILINOGEN, NITRITE,  LEUKOCYTESUR,  in the last 72 hours Misc. Labs:   Micro Results: No results found for this or any previous visit (from the past 240 hour(s)). Studies/Results: Dg Chest 2 View  12/14/2012   *RADIOLOGY REPORT*  Clinical Data: Hemoptysis  CHEST - 2 VIEW  Comparison: December 02, 2012.  Findings: Cardiomediastinal silhouette appears normal.  No acute pulmonary disease is noted.  Bony thorax is intact.  IMPRESSION: No acute cardiopulmonary abnormality seen.   Original Report Authenticated By: Lupita Raider.,  M.D.   Medications: I have reviewed the patient's current medications. Scheduled Meds:  citalopram  40 mg Oral Daily   pantoprazole  40 mg Oral BID AC   sodium chloride  3 mL Intravenous Q12H   sucralfate  1 g Oral QID   Continuous Infusions:   PRN Meds:.HYDROcodone-acetaminophen, ondansetron (ZOFRAN) IV, SUMAtriptan Assessment/Plan: Principal Problem:  Anemia  Active Problems:  VENOUS INSUFFICIENCY  HTN (hypertension)  Aortic stenosis  Barrett's esophagus  Other specified anemias   #: Symptomatic anemia -  Likely acute blood loss anemia due to rapid drop of Hb from  11 days.  S/p 1 unit PRBC.  Dr. Jenne Pane (ENT) scoped patient yesterday and saw no bleeding, feel that GI source is more likely.  Black stools and spitting of blood suggest likely upper GI source of bleeding.  No evidence of hemolysis on labs.  Hb 8.6 today after transfusion of one unit on 8/21.  GI recommends scope, but INR must be below 1.5 (1.89 today).  Continued active bleeding is unlikely in this patient with FOBT negative x2 and stable  Hb. - Appreciate ENT and GI consults. - continue q8h CBC to monitor Hgb level.   - Protonix 40 mg bid IV and sucralfate  - Hold Lasix and Amlodipine   # Hypertension. bp is 129/69.  Amlodipine and furosemide are being held for hypotension on admission.  Patient complains of hand swelling today and has crackles on pulmonary exam. -will continue to hold antihypertensives in  setting of anemia and concerns for bleeding. -Furosemide 40mg  and reevaluate for improvement of edema.    # DVT - Patient was diagnosed with DVT 11 weeks ago and started on coumadin therapy, CHEST guidelines recommend 3 months of treatment and reevaluation for distal DVT, repeat .  INR 1.89 today after 1mg  Vitamin K IV yesterday. Lower left leg is still warm and tender to palpation. - Warfarin d/c'd on 8/22 - 1mg  Vitamin K IV. - Repeat PT/INR at 1500  today. DVT  # Headache - Patient has hx of migraines. She took tramadol at home without improvement. Migraine still present, but well controlled. - continue Sumitriptan 25 mg bid prn  # DVT PPx: On coumadin for current DVT. Last INR on admission 8/21 = 2.8. Patient currently therapeutic, no need to anticoagulate.  -Continue to closely monitor INR.  # F/E/N  -NPO at midnight for possible GI procedures tomorrow.  -Monitor electrolytes by checking BMP -Replete 40 mEq of KCl po with furosemide dose.  Dispo: Disposition is deferred at this time, awaiting improvement of current medical problems. Anticipated discharge in approximately 2 to 3 day(s) pending further evaluation.  Signed:  Ivonne Andrew MS4 Pager [phone]  This is a Medical Student Note.  The care of the patient was discussed with Dr. Meredith Pel and the assessment and plan formulated with their assistance.  Please see their attached note for official documentation of the daily encounter.   LOS: 2 days   [facility] Provider Link Found] 12/16/2012, 8:09 AM

## 2012-12-16 NOTE — Progress Notes (Signed)
[facility] Progress Note   Subjective  Pt feels well. Had scant "pinkish" phlegm this morning (lighter in color than before) Ate breakfast without difficulty Stool test now heme neg x 2    Objective   Vital signs in last 24 hours: Temp:  [98 F (36.7 C)-99.5 F (37.5 C)] 98 F (36.7 C) (08/23 0955) Pulse Rate:  [63-78] 74 (08/23 0955) Resp:  [18-20] 18 (08/23 0955) BP: (102-126)/(58-74) 125/74 mmHg (08/23 0955) SpO2:  [96 %-100 %] 96 % (08/23 0955) Weight:  [132 lb (59.875 kg)] 132 lb (59.875 kg) (08/22 2040) Last BM Date: 12/14/12 Gen: awake, alert, NAD HEENT: anicteric, op clear CV: RRR, no mrg Pulm: CTA b/l Abd: soft, NT/ND, +BS throughout Ext: no c/c/e Neuro: nonfocal   Intake/Output from previous day: 08/22 0701 - 08/23 0700 In: 465 [P.O.:240; I.V.:225] Out: 350 [Urine:350] Intake/Output this shift: Total I/O In: 240 [P.O.:240] Out: 175 [Urine:175]  Lab Results:  Recent Labs  12/15/12 1130 12/15/12 2056 12/16/12 0430  WBC 4.1 5.5 4.0  HGB 8.1* 8.7* 8.6*  HCT 23.9* 26.0* 25.3*  PLT 252 275 291   BMET  Recent Labs  12/14/12 0943 12/15/12 0615 12/16/12 0430  NA 141 141 140  K 3.5 3.5 3.7  CL 106 107 107  CO2 27 25 24  GLUCOSE 96 84 85  BUN 14 9 7  CREATININE 0.87 0.85 0.82  CALCIUM 9.1 9.1 9.0   LFT  Recent Labs  12/15/12 0615  PROT 6.6  ALBUMIN 3.5  AST 18  ALT 10  ALKPHOS 59  BILITOT 0.5   PT/INR  Recent Labs  12/15/12 0615 12/16/12 0430  LABPROT 24.1* 21.1*  INR 2.24* 1.89*    Studies/Results: Dg Chest 2 View  12/14/2012   *RADIOLOGY REPORT*  Clinical Data: Hemoptysis  CHEST - 2 VIEW  Comparison: December 02, 2012.  Findings: Cardiomediastinal silhouette appears normal.  No acute pulmonary disease is noted.  Bony thorax is intact.  IMPRESSION: No acute cardiopulmonary abnormality seen.   Original Report Authenticated By: James Green Jr.,  M.D.       Assessment / Plan:   80 yo with hx of DVT recently on  warfarin, GERD with esophagitis, hx of gastroparesis and melena within the last 2 weeks and symptomatic anemia   1.  Melena (now resolved), anemia (improved after transfusion) -- unclear source of bleeding, now NOT bleeding with stable Hgb.  Stool no longer melenic and in fact heme neg.  Given hx, I still think EGD before d/c is appropriate.  Awaiting INR decline to less than 1.5 --EGD, perhaps tomorrow if INR allows --Monitor Hgb, daily sufficient at this point (s/p 1 U pRBC) --NPO after MN    LOS: 2 days    M , MD    

## 2012-12-17 ENCOUNTER — Encounter (HOSPITAL_COMMUNITY)
Admission: AD | Disposition: A | Discharge status: Home Health | Payer: Self-pay | Source: Ambulatory Visit | Attending: Internal Medicine

## 2012-12-17 ENCOUNTER — Encounter (HOSPITAL_COMMUNITY): Payer: Self-pay

## 2012-12-17 HISTORY — PX: ESOPHAGOGASTRODUODENOSCOPY: SHX5428

## 2012-12-17 LAB — BASIC METABOLIC PANEL
BUN: 9 mg/dL (ref 6–23)
Chloride: 105 mEq/L (ref 96–112)
GFR calc Af Amer: 66 mL/min — ABNORMAL LOW (ref >90)
GFR calc non Af Amer: 57 mL/min — ABNORMAL LOW (ref >90)
Potassium: 4.1 mEq/L (ref 3.5–5.1)
Sodium: 139 mEq/L (ref 135–145)

## 2012-12-17 LAB — CBC
HCT: 24.8 % — ABNORMAL LOW (ref 36.0–46.0)
Hemoglobin: 8.5 g/dL — ABNORMAL LOW (ref 12.0–15.0)
MCHC: 34.3 g/dL (ref 30.0–36.0)
WBC: 5.1 10*3/uL (ref 4.0–10.5)

## 2012-12-17 LAB — PROTIME-INR
INR: 1.28 (ref 0.00–1.49)
Prothrombin Time: 15.7 seconds — ABNORMAL HIGH (ref 11.6–15.2)

## 2012-12-17 SURGERY — EGD (ESOPHAGOGASTRODUODENOSCOPY)
Anesthesia: Moderate Sedation

## 2012-12-17 MED ORDER — MIDAZOLAM HCL 5 MG/ML IJ SOLN
INTRAMUSCULAR | Status: AC
  Filled 2012-12-17: qty 2

## 2012-12-17 MED ORDER — SUMATRIPTAN SUCCINATE 25 MG PO TABS
25.0000 mg | ORAL_TABLET | ORAL | Status: AC | PRN
  Administered 2012-12-17 (×2): 25 mg via ORAL
  Filled 2012-12-17 (×2): qty 1

## 2012-12-17 MED ORDER — MIDAZOLAM HCL 10 MG/2ML IJ SOLN
INTRAMUSCULAR | Status: DC | PRN
  Administered 2012-12-17 (×2): 2 mg via INTRAVENOUS

## 2012-12-17 MED ORDER — SODIUM CHLORIDE 0.9 % IV SOLN
INTRAVENOUS | Status: DC
  Administered 2012-12-17: 08:00:00 via INTRAVENOUS

## 2012-12-17 MED ORDER — FENTANYL CITRATE 0.05 MG/ML IJ SOLN
INTRAMUSCULAR | Status: DC | PRN
  Administered 2012-12-17 (×2): 25 ug via INTRAVENOUS

## 2012-12-17 MED ORDER — HEPARIN SODIUM (PORCINE) 5000 UNIT/ML IJ SOLN
5000.0000 [IU] | Freq: Three times a day (TID) | INTRAMUSCULAR | Status: DC
  Administered 2012-12-17 – 2012-12-19 (×7): 5000 [IU] via SUBCUTANEOUS
  Filled 2012-12-17 (×9): qty 1

## 2012-12-17 MED ORDER — PANTOPRAZOLE SODIUM 40 MG PO TBEC
40.0000 mg | DELAYED_RELEASE_TABLET | Freq: Every day | ORAL | Status: DC
  Administered 2012-12-18 – 2012-12-19 (×2): 40 mg via ORAL

## 2012-12-17 MED ORDER — BUTAMBEN-TETRACAINE-BENZOCAINE 2-2-14 % EX AERO
INHALATION_SPRAY | CUTANEOUS | Status: DC | PRN
  Administered 2012-12-17: 2 via TOPICAL

## 2012-12-17 MED ORDER — FENTANYL CITRATE 0.05 MG/ML IJ SOLN
INTRAMUSCULAR | Status: AC
  Filled 2012-12-17: qty 2

## 2012-12-17 NOTE — Progress Notes (Signed)
I saw and evaluated the patient. I personally confirmed the key portions of Dr. Aundria Rud' history and exam and reviewed pertinent patient test results. The assessment, diagnosis, and plan were formulated together and I agree with the documentation in the resident's note.  I am concerned the source of her bleed is either ENT or GI given her history.  Will admit for a blood transfusion and further evaluation by ENT and/or GI.

## 2012-12-17 NOTE — Interval H&P Note (Signed)
History and Physical Interval Note: No further black or bloody stools INR down to 1.28 this morning Plan for EGD. The nature of the procedure, as well as the risks, benefits, and alternatives were carefully and thoroughly reviewed with the patient. Ample time for discussion and questions allowed. The patient understood, was satisfied, and agreed to proceed.    12/17/2012 8:06 AM  Sherri Mcmillan  has presented today for surgery, with the diagnosis of melena, anemia  The various methods of treatment have been discussed with the patient and family. After consideration of risks, benefits and other options for treatment, the patient has consented to  Procedure(s): ESOPHAGOGASTRODUODENOSCOPY (EGD) (N/A) as a surgical intervention .  The patient's history has been reviewed, patient examined, no change in status, stable for surgery.  I have reviewed the patient's chart and labs.  Questions were answered to the patient's satisfaction.     ,  M

## 2012-12-17 NOTE — Progress Notes (Signed)
Internal Medicine Attending  Date: 12/17/2012  Patient name: Sherri Mcmillan Medical record number: [id number] Date of birth: Oct 19, 1932 Age: 79 y.o. Gender: female  I saw and evaluated the patient. I reviewed the resident's note by Dr. Clyde Lundborg and I agree with the resident's findings and plans as documented in his note.

## 2012-12-17 NOTE — H&P (View-Only) (Signed)
[facility] Progress Note   Subjective  Pt feels well. Had scant "pinkish" phlegm this morning (lighter in color than before) Ate breakfast without difficulty Stool test now heme neg x 2    Objective   Vital signs in last 24 hours: Temp:  [98 F (36.7 C)-99.5 F (37.5 C)] 98 F (36.7 C) (08/23 0955) Pulse Rate:  [63-78] 74 (08/23 0955) Resp:  [18-20] 18 (08/23 0955) BP: (102-126)/(58-74) 125/74 mmHg (08/23 0955) SpO2:  [96 %-100 %] 96 % (08/23 0955) Weight:  [132 lb (59.875 kg)] 132 lb (59.875 kg) (08/22 2040) Last BM Date: 12/14/12 Gen: awake, alert, NAD HEENT: anicteric, op clear CV: RRR, no mrg Pulm: CTA b/l Abd: soft, NT/ND, +BS throughout Ext: no c/c/e Neuro: nonfocal   Intake/Output from previous day: 08/22 0701 - 08/23 0700 In: 465 [P.O.:240; I.V.:225] Out: 350 [Urine:350] Intake/Output this shift: Total I/O In: 240 [P.O.:240] Out: 175 [Urine:175]  Lab Results:  Recent Labs  12/15/12 1130 12/15/12 2056 12/16/12 0430  WBC 4.1 5.5 4.0  HGB 8.1* 8.7* 8.6*  HCT 23.9* 26.0* 25.3*  PLT 252 275 291   BMET  Recent Labs  12/14/12 0943 12/15/12 0615 12/16/12 0430  NA 141 141 140  K 3.5 3.5 3.7  CL 106 107 107  CO2 27 25 24   GLUCOSE 96 84 85  BUN 14 9 7   CREATININE 0.87 0.85 0.82  CALCIUM 9.1 9.1 9.0   LFT  Recent Labs  12/15/12 0615  PROT 6.6  ALBUMIN 3.5  AST 18  ALT 10  ALKPHOS 59  BILITOT 0.5   PT/INR  Recent Labs  12/15/12 0615 12/16/12 0430  LABPROT 24.1* 21.1*  INR 2.24* 1.89*    Studies/Results: Dg Chest 2 View  12/14/2012   *RADIOLOGY REPORT*  Clinical Data: Hemoptysis  CHEST - 2 VIEW  Comparison: December 02, 2012.  Findings: Cardiomediastinal silhouette appears normal.  No acute pulmonary disease is noted.  Bony thorax is intact.  IMPRESSION: No acute cardiopulmonary abnormality seen.   Original Report Authenticated By: Lupita Raider.,  M.D.       Assessment / Plan:   79 yo with hx of DVT recently on  warfarin, GERD with esophagitis, hx of gastroparesis and melena within the last 2 weeks and symptomatic anemia   1.  Melena (now resolved), anemia (improved after transfusion) -- unclear source of bleeding, now NOT bleeding with stable Hgb.  Stool no longer melenic and in fact heme neg.  Given hx, I still think EGD before d/c is appropriate.  Awaiting INR decline to less than 1.5 --EGD, perhaps tomorrow if INR allows --Monitor Hgb, daily sufficient at this point (s/p 1 U pRBC) --NPO after MN    LOS: 2 days   Beverley Fiedler, MD

## 2012-12-17 NOTE — Op Note (Signed)
[facility] [address] [place], [address]   ENDOSCOPY PROCEDURE REPORT  PATIENT: Sherri Mcmillan, Sherri Mcmillan  MR#: [id number] BIRTHDATE: [birth year] , 80  yrs. old GENDER: Female ENDOSCOPIST: Beverley Fiedler, MD REFERRED BY:  Ileana Roup, M.D. PROCEDURE DATE:  12/17/2012 PROCEDURE:  EGD, diagnostic ASA CLASS:     Class III INDICATIONS:  Anemia.   Melena, though none recently. Hx of esophagitis MEDICATIONS: These medications were titrated to patient response per physician's verbal order, Fentanyl 50 mcg IV, and Versed 4 mg IV TOPICAL ANESTHETIC: Cetacaine Spray  DESCRIPTION OF PROCEDURE: After the risks benefits and alternatives of the procedure were thoroughly explained, informed consent was obtained.  The PENTAX GASTOROSCOPE [id number] endoscope was introduced through the mouth and advanced to the second portion of the duodenum. Without limitations.  The instrument was slowly withdrawn as the mucosa was fully examined.     ESOPHAGUS: The esophagus was mildly tortuous in the distal third. There was evidence of probable short-segment Barrett's esophagus at the gastroesophageal junction.   A non-obstructing Schatzki ring was found at GEJ, 36 cm from the incisors.   A 4 cm hiatal hernia was noted.  STOMACH: A large amount of food residue was found in the gastric fundus, gastric body, and proximal gastric antrum which obscured complete visualization the stomach.  The examined portions of the mucosa appeared normal with no evidence of active or recent bleeding.  No retained clot or heme.  The pylorus is normal.  DUODENUM: The duodenal mucosa showed no abnormalities in the bulb and second portion of the duodenum.  Retroflexed views revealed a hiatal hernia.     The scope was then withdrawn from the patient and the procedure completed.  COMPLICATIONS: There were no complications.  ENDOSCOPIC IMPRESSION: 1.   The esophagus was mildly tortuous in the distal  third 2.   There was evidence of suspected short segment Barrett's esophagus 3.   Nonobstructing Schatzki ring was found 36 cm from the incisors 4.   4 cm hiatal hernia 5.   Food residue in the gastric fundus, gastric body, and gastric antrum; obscuring complete visualization of the gastric mucosa 6.   The duodenal mucosa showed no abnormalities in the bulb and second portion of the duodenum 7.   No bleeding source or evidence of recent bleeding found  RECOMMENDATIONS: 1.  Continue daily PPI 2.  Office followup with Dr.  Melvia Heaps, [facility], after discharge 3.  Okay for discharge from GI perspective   eSigned:  Beverley Fiedler, MD 12/17/2012 8:35 AM   CC:The Patient and Melvia Heaps, MD  PATIENT NAME:  [name], [name] MR#: [id number]

## 2012-12-17 NOTE — Progress Notes (Signed)
Subjective:  - Patient feels fine. No nausea or abdominal pain.  - No fever or chills.  - Hgb 8.6-->8.5  - No bloody stool  - Repeated LE doppler: Preliminary findings showed no evidence of DVT.  - tolerated EGD: No bleeding source or evidence of recent bleeding found. There was evidence of suspected short segment Barrett's esophagus. Nonobstructing Schatzki ring was found 36 cm from the incisors - INR: 1.28  Recent Labs Lab 12/11/12 1029 12/14/12 0943 12/15/12 0615 12/16/12 0430 12/17/12 0430  INR 2.10 2.8 2.24* 1.89* 1.28   Objective: Vital signs in last 24 hours: Filed Vitals:   12/17/12 0850 12/17/12 0855 12/17/12 1011 12/17/12 1400  BP: 112/64 116/68 107/58 114/61  Pulse:   64 66  Temp:   98.5 F (36.9 C) 98.6 F (37 C)  TempSrc:   Oral Oral  Resp: 13 13 16 18   Height:      Weight:      SpO2: 100% 100% 98% 98%   Weight change: 0 lb (0 kg)  Intake/Output Summary (Last 24 hours) at 12/17/12 1536 Last data filed at 12/17/12 1400  Gross per 24 hour  Intake   1200 ml  Output   1320 ml  Net   -120 ml    General: Not in acute distress, resting comfortably in bed  HEENT: PERRL, no scleral icterus  Cardiac: S1/S2, RRR, 2/6 of systolic murmur over aortic valve area. Pulm: Good air movement bilaterally. Faint expiratory wheezes heard best in lower lung fields. No increased work of breathing.  Abd: Soft, nondistended, nontender, no rebound pain, no organomegaly, BS present  Ext: Mild swelling in left lower extremity. Mild tenderness over the calf. No edema of right lower extremity. 2+DP/PT pulse bilaterally  Musculoskeletal: No joint deformities, erythema, or stiffness, ROM full Skin: No rashes.  Neuro: Alert and oriented X3  Physical Exam:   Filed Vitals:   12/17/12 0850 12/17/12 0855 12/17/12 1011 12/17/12 1400  BP: 112/64 116/68 107/58 114/61  Pulse:   64 66  Temp:   98.5 F (36.9 C) 98.6 F (37 C)  TempSrc:   Oral Oral  Resp: 13 13 16 18   Height:       Weight:      SpO2: 100% 100% 98% 98%    .nxlpe  Lab Results: Basic Metabolic Panel:  Recent Labs Lab 12/16/12 0430 12/17/12 0430  NA 140 139  K 3.7 4.1  CL 107 105  CO2 24 26  GLUCOSE 85 95  BUN 7 9  CREATININE 0.82 0.93  CALCIUM 9.0 9.2   Liver Function Tests:  Recent Labs Lab 12/15/12 0615  AST 18  ALT 10  ALKPHOS 59  BILITOT 0.5  PROT 6.6  ALBUMIN 3.5   No results found for this basename: LIPASE, AMYLASE,  in the last 168 hours No results found for this basename: AMMONIA,  in the last 168 hours CBC:  Recent Labs Lab 12/14/12 1656  12/16/12 0430 12/17/12 0430  WBC  --   < > 4.0 5.1  NEUTROABS 2.4  --   --   --   HGB  --   < > 8.6* 8.5*  HCT  --   < > 25.3* 24.8*  MCV  --   < > 85.8 85.2  PLT  --   < > 291 286  < > = values in this interval not displayed. Cardiac Enzymes:  Recent Labs Lab 12/14/12 1439  TROPONINI <0.30   BNP:  Recent Labs Lab 12/14/12  1439  PROBNP 646.7*   D-Dimer: No results found for this basename: DDIMER,  in the last 168 hours CBG:  Recent Labs Lab 12/16/12 2044  GLUCAP 124*   Hemoglobin A1C: No results found for this basename: HGBA1C,  in the last 168 hours Fasting Lipid Panel: No results found for this basename: CHOL, HDL, LDLCALC, TRIG, CHOLHDL, LDLDIRECT,  in the last 168 hours Thyroid Function Tests: No results found for this basename: TSH, T4TOTAL, FREET4, T3FREE, THYROIDAB,  in the last 168 hours Coagulation:  Recent Labs Lab 12/14/12 0943 12/15/12 0615 12/16/12 0430 12/17/12 0430  LABPROT  --  24.1* 21.1* 15.7*  INR 2.8 2.24* 1.89* 1.28   Anemia Panel:  Recent Labs Lab 12/14/12 1439 12/15/12 0615  VITAMINB12 426  --   FOLATE 16.5  --   FERRITIN 11  --   TIBC 352  --   IRON 13*  --   RETICCTPCT 2.3 2.3   Urine Drug Screen: Drugs of Abuse  No results found for this basename: labopia,  cocainscrnur,  labbenz,  amphetmu,  thcu,  labbarb    Alcohol Level: No results found for this  basename: ETH,  in the last 168 hours Urinalysis: No results found for this basename: COLORURINE, APPERANCEUR, LABSPEC, PHURINE, GLUCOSEU, HGBUR, BILIRUBINUR, KETONESUR, PROTEINUR, UROBILINOGEN, NITRITE, LEUKOCYTESUR,  in the last 168 hours Misc. Labs:  Micro Results: No results found for this or any previous visit (from the past 240 hour(s)). Studies/Results: No results found. Medications:  Scheduled Meds: . citalopram  40 mg Oral Daily  . heparin subcutaneous  5,000 Units Subcutaneous Q8H  . [START ON 12/18/2012] pantoprazole  40 mg Oral Daily  . sodium chloride  3 mL Intravenous Q12H  . SUMAtriptan  25 mg Oral Q6H   Continuous Infusions: . sodium chloride 20 mL/hr at 12/17/12 0730   PRN Meds:.HYDROcodone-acetaminophen, ondansetron (ZOFRAN) IV Assessment/Plan:   #: Symptomatic anemia.  Etiology is not clear now. Most likely due to acute blood loss on chronic anemia. Her Hgb decreased from 10.3 on 12/02/2012 to 6.7 8/21. One possibility is hemoptysis.  ENT was consulted. Dr. Jenne Pane performed the fiberoptic laryngoscope , which showed no blood or bleeding source. Dr. Jenne Pane recommended GI consultation. Another positivity is GI bleeding given her hx of possible ulcers and recent black stool.  Patient's Hgb has been stable after received 1 unit of blood.  It is unlikely that patient has hemolysis given normal haptoglobin and total bilirubin. GI consulted, EGD showed no source of bleeding.   - Appreciate ENT and GI consultation in managing our patient.   - Will give 1 mg IV Vk to reverse INR  - daily CBC to monitor Hgb level, will transfuse if Hgb<7.0.   - Zofran for nausea - continue sucralfate and Protonix 40 mg qd - Hold Amlodipine (bp 116/68 mmHg).   # Hypertension. bp is 116/68. Amlodipine and furosemide are being held for hypotension on admission.  -will continue to hold antihypertensives in setting of anemia and concerns for bleeding.   # DVT - Patient was diagnosed with DVT 11  weeks ago and started on coumadin therapy, CHEST guidelines recommend 3 months of treatment and reevaluation for distal DVT. She almost completed her course of coumadin. Repeated LE doppler has no DVT. Lower left leg is tender to palpation, which is likely due to superficial venous thrombus. - Warfarin d/c'd on 8/22  - SQ heparin - will not restart coumadin on discharge. Will follow up in clinic for re-assement after discharge.   #  Headache - Patient has hx of migraines. She took tramadol at home without help. She responded to low dose of sumatriptan - continue Sumitriptan 25 mg q6h prn  # DVT PPx:  SQ heparin  #  F/E/N   -SL -Monitor electrolytes by checking BMP -Low fiber diet per GI  Disposition: likely 8/25    LOS: 3 days   Lorretta Harp 12/17/2012, 3:36 PM

## 2012-12-18 ENCOUNTER — Encounter (HOSPITAL_COMMUNITY): Payer: Self-pay | Admitting: Internal Medicine

## 2012-12-18 LAB — BASIC METABOLIC PANEL
Calcium: 9.2 mg/dL (ref 8.4–10.5)
Chloride: 105 mEq/L (ref 96–112)
Creatinine, Ser: 1 mg/dL (ref 0.50–1.10)
GFR calc Af Amer: 61 mL/min — ABNORMAL LOW (ref >90)
GFR calc non Af Amer: 53 mL/min — ABNORMAL LOW (ref >90)

## 2012-12-18 LAB — CBC
MCH: 29.3 pg (ref 26.0–34.0)
MCHC: 34.2 g/dL (ref 30.0–36.0)
MCV: 85.7 fL (ref 78.0–100.0)
Platelets: 269 10*3/uL (ref 150–400)
RDW: 14.1 % (ref 11.5–15.5)
WBC: 5.6 10*3/uL (ref 4.0–10.5)

## 2012-12-18 MED ORDER — SUMATRIPTAN SUCCINATE 25 MG PO TABS
25.0000 mg | ORAL_TABLET | Freq: Four times a day (QID) | ORAL | Status: DC | PRN
  Administered 2012-12-18 – 2012-12-19 (×5): 25 mg via ORAL
  Filled 2012-12-18 (×5): qty 1

## 2012-12-18 MED ORDER — ONDANSETRON HCL 4 MG PO TABS: 4.0000 mg | ORAL_TABLET | Freq: Four times a day (QID) | ORAL | Status: DC | PRN

## 2012-12-18 NOTE — Progress Notes (Signed)
Internal Medicine Attending  Date: 12/18/2012  Patient name: Sherri Mcmillan Medical record number: [id number] Date of birth: 1933-01-08 Age: 79 y.o. Gender: female  I saw and evaluated the patient on a.m. rounds with house staff.  I reviewed the resident's note by Dr. Clyde Lundborg and I agree with the resident's findings and plans as documented in his note.

## 2012-12-18 NOTE — Progress Notes (Signed)
Discontinued cardiac telemetry per order. Pt has met discontinuation criteria. Sherri Mcmillan

## 2012-12-18 NOTE — Progress Notes (Signed)
Physical Therapy Treatment Patient Details Name: Sherri Mcmillan MRN: [id number] DOB: [birth year] Today's Date: 12/18/2012 Time: 4098-1191 PT Time Calculation (min): 31 min  PT Assessment / Plan / Recommendation  History of Present Illness Patient is a 79 yo female admitted with dizziness, weakness, and poor balance x10 days.  Patient with anemia and hypotension.  Patient with h/o DVT LLE 3 months ago.   PT Comments   Pt with increased pain in L calf today which is tender to palpation however no pain with active or passive motion.  Pt required seated rest break due to dizziness, headache, and L calf pain during ambulation.  Pt states calf pain at rest is 8.5/10 and increases to 9/10 during ambulation, and pt states a little relief with elevated LEs and rest.   Follow Up Recommendations  Home health PT;Supervision for mobility/OOB     Does the patient have the potential to tolerate intense rehabilitation     Barriers to Discharge        Equipment Recommendations  None recommended by PT    Recommendations for Other Services    Frequency     Progress towards PT Goals Progress towards PT goals: Progressing toward goals  Plan Current plan remains appropriate    Precautions / Restrictions Precautions Precautions: Fall   Pertinent Vitals/Pain See above, repositioned to comfort   Mobility  Transfers Transfers: Sit to Stand;Stand to Sit Sit to Stand: 4: Min guard;With upper extremity assist;From chair/3-in-1 Stand to Sit: 4: Min guard;With upper extremity assist;To chair/3-in-1 Details for Transfer Assistance: verbal cues for hand placement Ambulation/Gait Ambulation/Gait Assistance: 4: Min assist Ambulation Distance (Feet): 70 Feet (x2) Assistive device: Rolling walker Ambulation/Gait Assistance Details: pt reports 9/10 L calf pain with ambulation so ambulated to door with 1 HHA then pt given RW to assist with pain, pt also reports headache and slight dizziness along with calf pain  required seated rest break Gait Pattern: Step-through pattern;Decreased stride length;Trunk flexed;Antalgic Gait velocity: Slow gait speed    Exercises     PT Diagnosis:    PT Problem List:   PT Treatment Interventions:     PT Goals (current goals can now be found in the care plan section)    Visit Information  Last PT Received On: 12/18/12 Assistance Needed: +1 History of Present Illness: Patient is a 79 yo female admitted with dizziness, weakness, and poor balance x10 days.  Patient with anemia and hypotension.  Patient with h/o DVT LLE 3 months ago.    Subjective Data      Cognition  Cognition Arousal/Alertness: Awake/alert Behavior During Therapy: WFL for tasks assessed/performed Overall Cognitive Status: Within Functional Limits for tasks assessed    Balance     End of Session PT - End of Session Equipment Utilized During Treatment: Gait belt Activity Tolerance: Patient limited by fatigue;Patient limited by pain Patient left: with call bell/phone within reach;in chair   GP     ,KATHrine E 12/18/2012, 12:32 PM Zenovia Jarred, PT, DPT 12/18/2012 Pager: [phone]

## 2012-12-18 NOTE — Progress Notes (Signed)
I have seen the patient and reviewed the daily progress note by medical student,  Josh and discussed the care of the patient with them. Please see my note for documentation of my findings, assessment, and plans.    , MD PGY3, Internal Medicine Teaching Service Pager: [phone]   

## 2012-12-18 NOTE — Progress Notes (Signed)
Subjective:  - Patient feels fine. No nausea or abdominal pain.  - Patient still has very significant pain over the L calf area. - No fever or chills.  - Hgb 8.6-->8.5-->8.2 - No bloody stool  - Repeated LE doppler: showed no evidence of DVT.  - EGD: No bleeding source or evidence of recent bleeding found. There was evidence of suspected short segment Barrett's esophagus. Nonobstructing Schatzki ring was found 36 cm from the incisors - INR: 1.16  Recent Labs Lab 12/14/12 0943 12/15/12 0615 12/16/12 0430 12/17/12 0430 12/18/12 0445  INR 2.8 2.24* 1.89* 1.28 1.16   Objective: Vital signs in last 24 hours: Filed Vitals:   12/17/12 2327 12/18/12 0556 12/18/12 1000 12/18/12 1300  BP: 117/56 118/68 134/76 128/82  Pulse: 62 66 69 72  Temp: 98.6 F (37 C) 98.7 F (37.1 C) 98 F (36.7 C) 98.2 F (36.8 C)  TempSrc: Oral Oral Oral Oral  Resp: 18 18 18 18   Height: 5' (1.524 m)     Weight: 138 lb 3.2 oz (62.687 kg)     SpO2: 100% 100% 100% 100%   Weight change: 6 lb 3.2 oz (2.812 kg)  Intake/Output Summary (Last 24 hours) at 12/18/12 1619 Last data filed at 12/18/12 1300  Gross per 24 hour  Intake    600 ml  Output    901 ml  Net   -301 ml    General: Not in acute distress, resting comfortably in bed  HEENT: PERRL, no scleral icterus  Cardiac: S1/S2, RRR, 2/6 of systolic murmur over aortic valve area. Pulm: Good air movement bilaterally. Faint expiratory wheezes heard best in lower lung fields. No increased work of breathing.  Abd: Soft, nondistended, nontender, no rebound pain, no organomegaly, BS present  Ext: Mild swelling in left lower extremity. Tenderness over the calf. No edema of right lower extremity. 2+DP/PT pulse bilaterally  Musculoskeletal: No joint deformities, erythema, or stiffness, ROM full Skin: No rashes.  Neuro: Alert and oriented X3  Physical Exam:   Filed Vitals:   12/17/12 2327 12/18/12 0556 12/18/12 1000 12/18/12 1300  BP: 117/56 118/68 134/76  128/82  Pulse: 62 66 69 72  Temp: 98.6 F (37 C) 98.7 F (37.1 C) 98 F (36.7 C) 98.2 F (36.8 C)  TempSrc: Oral Oral Oral Oral  Resp: 18 18 18 18   Height: 5' (1.524 m)     Weight: 138 lb 3.2 oz (62.687 kg)     SpO2: 100% 100% 100% 100%   Lab Results: Basic Metabolic Panel:  Recent Labs Lab 12/17/12 0430 12/18/12 0445  NA 139 138  K 4.1 4.4  CL 105 105  CO2 26 27  GLUCOSE 95 100*  BUN 9 16  CREATININE 0.93 1.00  CALCIUM 9.2 9.2   Liver Function Tests:  Recent Labs Lab 12/15/12 0615  AST 18  ALT 10  ALKPHOS 59  BILITOT 0.5  PROT 6.6  ALBUMIN 3.5   No results found for this basename: LIPASE, AMYLASE,  in the last 168 hours No results found for this basename: AMMONIA,  in the last 168 hours CBC:  Recent Labs Lab 12/14/12 1656  12/17/12 0430 12/18/12 0445  WBC  --   < > 5.1 5.6  NEUTROABS 2.4  --   --   --   HGB  --   < > 8.5* 8.2*  HCT  --   < > 24.8* 24.0*  MCV  --   < > 85.2 85.7  PLT  --   < >  286 269  < > = values in this interval not displayed. Cardiac Enzymes:  Recent Labs Lab 12/14/12 1439  TROPONINI <0.30   BNP:  Recent Labs Lab 12/14/12 1439  PROBNP 646.7*   D-Dimer: No results found for this basename: DDIMER,  in the last 168 hours CBG:  Recent Labs Lab 12/16/12 2044  GLUCAP 124*   Hemoglobin A1C: No results found for this basename: HGBA1C,  in the last 168 hours Fasting Lipid Panel: No results found for this basename: CHOL, HDL, LDLCALC, TRIG, CHOLHDL, LDLDIRECT,  in the last 168 hours Thyroid Function Tests: No results found for this basename: TSH, T4TOTAL, FREET4, T3FREE, THYROIDAB,  in the last 168 hours Coagulation:  Recent Labs Lab 12/15/12 0615 12/16/12 0430 12/17/12 0430 12/18/12 0445  LABPROT 24.1* 21.1* 15.7* 14.6  INR 2.24* 1.89* 1.28 1.16   Anemia Panel:  Recent Labs Lab 12/14/12 1439 12/15/12 0615  VITAMINB12 426  --   FOLATE 16.5  --   FERRITIN 11  --   TIBC 352  --   IRON 13*  --    RETICCTPCT 2.3 2.3   Urine Drug Screen: Drugs of Abuse  No results found for this basename: labopia,  cocainscrnur,  labbenz,  amphetmu,  thcu,  labbarb    Alcohol Level: No results found for this basename: ETH,  in the last 168 hours Urinalysis: No results found for this basename: COLORURINE, APPERANCEUR, LABSPEC, PHURINE, GLUCOSEU, HGBUR, BILIRUBINUR, KETONESUR, PROTEINUR, UROBILINOGEN, NITRITE, LEUKOCYTESUR,  in the last 168 hours Misc. Labs:  Micro Results: No results found for this or any previous visit (from the past 240 hour(s)). Studies/Results: No results found. Medications:  Scheduled Meds: . citalopram  40 mg Oral Daily  . heparin subcutaneous  5,000 Units Subcutaneous Q8H  . pantoprazole  40 mg Oral Daily  . sodium chloride  3 mL Intravenous Q12H   Continuous Infusions: . sodium chloride 20 mL/hr at 12/17/12 0730   PRN Meds:.HYDROcodone-acetaminophen, ondansetron (ZOFRAN) IV, ondansetron, SUMAtriptan Assessment/Plan:   #: Symptomatic anemia.  Etiology is not clear now. Most likely due to acute blood loss on chronic anemia. Her Hgb decreased from 10.3 on 12/02/2012 to 6.7 8/21. One possibility is hemoptysis.  ENT was consulted. Dr. Jenne Pane performed the fiberoptic laryngoscope , which showed no blood or bleeding source. Dr. Jenne Pane recommended GI consultation. Another positivity is GI bleeding given her hx of possible ulcers and recent black stool.  Patient's Hgb has been stable after received 1 unit of blood.  It is unlikely that patient has hemolysis given normal haptoglobin and total bilirubin. GI consulted, EGD showed no source of bleeding.   - Appreciate ENT and GI consultation in managing our patient.   - daily CBC to monitor Hgb level, will transfuse if Hgb<7.0.   - Zofran for nausea - continue sucralfate and Protonix 40 mg qd - Hold Amlodipine  # Hypertension. bp is normal today. Amlodipine and furosemide are being held for hypotension on admission.  -will  continue to hold antihypertensives in setting of anemia and concerns for bleeding.   # DVT - Patient was diagnosed with DVT 11 weeks ago and started on coumadin therapy, CHEST guidelines recommend 3 months of treatment and reevaluation for distal DVT. She almost completed her course of coumadin. Repeated LE doppler has no DVT. Lower left leg is very tender to palpation, which is likely due to superficial venous thrombus, suspect to have thrombophlebitis?  - Warfarin d/c'd on 8/22  - will consult [facility] - SQ heparin  #  Headache - Patient has hx of migraines. She took tramadol at home without help. She responded to low dose of sumatriptan - continue Sumitriptan 25 mg q6h prn  # DVT PPx:  SQ heparin  #  F/E/N   -SL -Monitor electrolytes by checking BMP -Low fiber diet per GI  Disposition: likely 8/26    LOS: 4 days   Lorretta Harp 12/18/2012, 4:19 PM

## 2012-12-18 NOTE — Progress Notes (Signed)
Subjective:  - Patient feels fine. No nausea or abdominal pain.  Headache improved, though still present. - No fever or chills.  - Hgb 8.6-->8.5-->8.2 - BM yesterday, but patient did not inspect before flushing, however she noted that the stool on the toilet paper was normal appearing and brown in color. - tolerated EGD: No bleeding source or evidence of recent bleeding found. There was evidence of suspected short segment Barrett's esophagus. Nonobstructing Schatzki ring was found 36 cm from the incisors - INR: 1.16 this am   Recent Labs Lab 12/14/12 0943 12/15/12 0615 12/16/12 0430 12/17/12 0430 12/18/12 0445  INR 2.8 2.24* 1.89* 1.28 1.16   Objective: Vital signs in last 24 hours: Filed Vitals:   12/17/12 1011 12/17/12 1400 12/17/12 2327 12/18/12 0556  BP: 107/58 114/61 117/56 118/68  Pulse: 64 66 62 66  Temp: 98.5 F (36.9 C) 98.6 F (37 C) 98.6 F (37 C) 98.7 F (37.1 C)  TempSrc: Oral Oral Oral Oral  Resp: 16 18 18 18   Height:   5' (1.524 m)   Weight:   62.687 kg (138 lb 3.2 oz)   SpO2: 98% 98% 100% 100%   Weight change: 2.812 kg (6 lb 3.2 oz)  Intake/Output Summary (Last 24 hours) at 12/18/12 0834 Last data filed at 12/18/12 0650  Gross per 24 hour  Intake    600 ml  Output    951 ml  Net   -351 ml    General: Not in acute distress, resting comfortably in bed  HEENT: PERRL, no scleral icterus  Cardiac: S1/S2, RRR, 2/6 of systolic murmur over aortic valve area. Pulm: Good air movement bilaterally. Faint expiratory wheezes heard throughout. No increased work of breathing.  Abd: Soft, nondistended, nontender, no rebound pain, no organomegaly, BS present  Ext: Mild swelling in left lower extremity. Mild tenderness over the calf. No edema of right lower extremity. Palpable cords in calf.  Skin: No rashes.  Neuro: Alert and oriented X3  Physical Exam:   Filed Vitals:   12/17/12 1011 12/17/12 1400 12/17/12 2327 12/18/12 0556  BP: 107/58 114/61 117/56 118/68   Pulse: 64 66 62 66  Temp: 98.5 F (36.9 C) 98.6 F (37 C) 98.6 F (37 C) 98.7 F (37.1 C)  TempSrc: Oral Oral Oral Oral  Resp: 16 18 18 18   Height:   5' (1.524 m)   Weight:   62.687 kg (138 lb 3.2 oz)   SpO2: 98% 98% 100% 100%    .nxlpe  Lab Results: Basic Metabolic Panel:  Recent Labs Lab 12/17/12 0430 12/18/12 0445  NA 139 138  K 4.1 4.4  CL 105 105  CO2 26 27  GLUCOSE 95 100*  BUN 9 16  CREATININE 0.93 1.00  CALCIUM 9.2 9.2   Liver Function Tests:  Recent Labs Lab 12/15/12 0615  AST 18  ALT 10  ALKPHOS 59  BILITOT 0.5  PROT 6.6  ALBUMIN 3.5   No results found for this basename: LIPASE, AMYLASE,  in the last 168 hours No results found for this basename: AMMONIA,  in the last 168 hours CBC:  Recent Labs Lab 12/14/12 1656  12/17/12 0430 12/18/12 0445  WBC  --   < > 5.1 5.6  NEUTROABS 2.4  --   --   --   HGB  --   < > 8.5* 8.2*  HCT  --   < > 24.8* 24.0*  MCV  --   < > 85.2 85.7  PLT  --   < >  286 269  < > = values in this interval not displayed. Cardiac Enzymes:  Recent Labs Lab 12/14/12 1439  TROPONINI <0.30   BNP:  Recent Labs Lab 12/14/12 1439  PROBNP 646.7*   D-Dimer: No results found for this basename: DDIMER,  in the last 168 hours CBG:  Recent Labs Lab 12/16/12 2044  GLUCAP 124*   Hemoglobin A1C: No results found for this basename: HGBA1C,  in the last 168 hours Fasting Lipid Panel: No results found for this basename: CHOL, HDL, LDLCALC, TRIG, CHOLHDL, LDLDIRECT,  in the last 168 hours Thyroid Function Tests: No results found for this basename: TSH, T4TOTAL, FREET4, T3FREE, THYROIDAB,  in the last 168 hours Coagulation:  Recent Labs Lab 12/15/12 0615 12/16/12 0430 12/17/12 0430 12/18/12 0445  LABPROT 24.1* 21.1* 15.7* 14.6  INR 2.24* 1.89* 1.28 1.16   Anemia Panel:  Recent Labs Lab 12/14/12 1439 12/15/12 0615  VITAMINB12 426  --   FOLATE 16.5  --   FERRITIN 11  --   TIBC 352  --   IRON 13*  --    RETICCTPCT 2.3 2.3   Urine Drug Screen: Drugs of Abuse  No results found for this basename: labopia,  cocainscrnur,  labbenz,  amphetmu,  thcu,  labbarb    Alcohol Level: No results found for this basename: ETH,  in the last 168 hours Urinalysis: No results found for this basename: COLORURINE, APPERANCEUR, LABSPEC, PHURINE, GLUCOSEU, HGBUR, BILIRUBINUR, KETONESUR, PROTEINUR, UROBILINOGEN, NITRITE, LEUKOCYTESUR,  in the last 168 hours Misc. Labs:  Micro Results: No results found for this or any previous visit (from the past 240 hour(s)). Studies/Results: No results found. Medications:  Scheduled Meds:  citalopram  40 mg Oral Daily   heparin subcutaneous  5,000 Units Subcutaneous Q8H   pantoprazole  40 mg Oral Daily   sodium chloride  3 mL Intravenous Q12H   Continuous Infusions:  sodium chloride 20 mL/hr at 12/17/12 0730   PRN Meds:.HYDROcodone-acetaminophen, ondansetron (ZOFRAN) IV Assessment/Plan:   #: Symptomatic anemia.  Etiology is not clear now. Most likely due to acute blood loss on chronic anemia. Her Hgb decreased from 10.3 on 12/02/2012 to 6.7 8/21. One possibility is hemoptysis.  ENT was consulted. Dr. Jenne Pane performed the fiberoptic laryngoscope , which showed no blood or bleeding source. Dr. Jenne Pane recommended GI consultation. Another positivity is GI bleeding given her hx of possible ulcers and recent black stool.  Patient's Hgb has been stable after received 1 unit of blood.  It is unlikely that patient has hemolysis given normal haptoglobin and total bilirubin. GI consulted, EGD showed no source of bleeding.   - Appreciate ENT and GI consultation in managing our patient.   - Zofran for nausea - continue sucralfate and Protonix 40 mg qd - Hold Amlodipine patient has been normotensive to mildly hypotensive x 48 hours.  # Hypertension. Last charted BP 118/68. Amlodipine is being held, furosemide restarted at 40 daily.   -will continue to hold amlodipine in  setting of anemia with some lightheadedness.   # DVT - Patient was diagnosed with DVT 11 weeks ago and started on coumadin therapy, CHEST guidelines recommend 3 months of treatment and reevaluation for distal DVT. She almost completed her course of coumadin. Repeated LE doppler has no DVT. Lower left leg is tender to palpation, which is likely due to superficial venous thrombus. - Warfarin d/c'd on 8/22  - SQ heparin - will not restart coumadin on discharge. Will follow up in clinic for re-assement after discharge.   #  Headache - Patient has hx of migraines. She took tramadol at home without help. She responded to low dose of sumatriptan - continue Sumitriptan 25 mg q6h prn   # Superficial Thrombophlebitis - This issue has been going on for several months now, patient is experiencing significant pain. -Inpatient consult with Vascular surgery.  # DVT PPx:  SQ heparin  #  F/E/N   -SL -Monitor electrolytes by checking BMP -Low fiber diet per GI  Disposition: likely 8/26    LOS: 4 days   Festus Holts 12/18/2012, 8:34 AM

## 2012-12-18 NOTE — Consult Note (Signed)
[facility] ADMIT NOTE  MRN : [id number]  [id number] calf pain  History of Present Illness: 79 y.o. Female with a 3 month history of calf pain and swelling.  A venous ultra sound showed acute DVT without phlebitis 09/28/2012.  She states she has not had any relief of the pain with the use of compression hose, tramadol, elevation and coumadin since this started in June 2014.  She has been admitted to the hospital secondary to anemia of unknown cause and was taken off her coumadin and placed on Heparin 5000 units SQ q 8 for DVT treatment.  We have been asked to consult regarding her left calf pain.  Her past medical conditions is  Hypertension treated with Norvasc and lasix.   She is not on a statin drug at this time.       Current Facility-Administered Medications  Medication Dose Route Frequency Provider Last Rate Last Dose  . 0.9 %  sodium chloride infusion   Intravenous Continuous Beverley Fiedler, MD 20 mL/hr at 12/17/12 0730    . citalopram (CELEXA) tablet 40 mg  40 mg Oral Daily Lorretta Harp, MD   40 mg at 12/18/12 0944  . heparin injection 5,000 Units  5,000 Units Subcutaneous Q8H Lorretta Harp, MD   5,000 Units at 12/18/12 1433  . HYDROcodone-acetaminophen (NORCO/VICODIN) 5-325 MG per tablet 1 tablet  1 tablet Oral Q6H PRN Lorretta Harp, MD   1 tablet at 12/17/12 1532  . ondansetron (ZOFRAN) injection 4 mg  4 mg Intravenous Q8H PRN Lorretta Harp, MD      . ondansetron [facility]) tablet 4 mg  4 mg Oral Q6H PRN Lorretta Harp, MD      . pantoprazole (PROTONIX) EC tablet 40 mg  40 mg Oral Daily Lorretta Harp, MD   40 mg at 12/18/12 0944  . sodium chloride 0.9 % injection 3 mL  3 mL Intravenous Q12H Lorretta Harp, MD   3 mL at 12/18/12 0945  . SUMAtriptan (IMITREX) tablet 25 mg  25 mg Oral Q6H PRN Lorretta Harp, MD   25 mg at 12/18/12 0944    Pt meds include: Statin :No Betablocker: No ASA: No Other anticoagulants/antiplatelets: SQ Heparin  Past Medical History  Diagnosis Date  . Chronic pain      DJD knees, back, migranes, LLE varicose veins, and LLE neuropathy.   . Aortic stenosis     Dx ECHO 2008 , mild and aymptomatic , needs ECHO q 3-5 yrs  . Barrett's esophagus     Demonstrated on EGD 12/2010. EGD 09/17/2012 shows inflamed GE junctional mucosa without metaplasia, dysplasia, or malignancy.  . Chronic insomnia   . Chronic anxiety     Admission to [facility] (90s or early 2000)  for 6 weeks after mother died. Complicated again by death of her sister 2010. 2012 developed hallucinations and I refused to refill controlled meds unless she see psych which she is not agreable to  . Elevated cholesterol 10/11    LDL 155. Her 10 year risk (decreasing her age to 50 as the calculator won't go to age 13) is 21% ish. If consider her non smoker (which I wouldn't even though she says 1 pak lasts 2 weeks) risk is 12% ish. So goal for LDL is 130 or 100.  Marland Kitchen HTN (hypertension)     Controlled with 2 drug therapy  . Migraine   . Cataract   . Depression   . Gastroparesis     Demonstrated on GES  12/2010 by Dr Dalene Seltzer  . Bronchitis     hx of  . Shortness of breath   . Left leg DVT 09/28/2012  . Anemia 12/14/2012    Past Surgical History  Procedure Laterality Date  . Direct laryngoscopy   September 2008     preoperative diagnosis hoarseness with anterior right vocal cord lesion -  direct laryngoscopy and excisional biopsy of right anterior vocal cord lesion done by Dr. Ezzard Standing  . Meniscectomy   July 2002     preoperative diagnosis torn medial meniscus right knee, partial medial meniscectomy, debridement chondroplasty patellofemoral joint, done by Dr. Madelon Lips  . Abdominal hysterectomy    . Back surgery    . Hemorrhoid surgery    . Appendectomy    . Cataract extraction      left eye  . Polypectomy  2000    Dr.Magod  . Colonoscopy  2000&2005    Dr.Magod  . Esophagogastroduodenoscopy  11/2010  . Knee arthroscopy    . Eye surgery    . Rotator cuff repair  09/21/2011    rt shoulder  .  Esophagogastroduodenoscopy N/A 09/17/2012    Procedure: ESOPHAGOGASTRODUODENOSCOPY (EGD);  Surgeon: Hart Carwin, MD;  Location[facility] ENDOSCOPY;  Service: Endoscopy;  Laterality: N/A;  . Esophagogastroduodenoscopy N/A 12/17/2012    Procedure: ESOPHAGOGASTRODUODENOSCOPY (EGD);  Surgeon: Beverley Fiedler, MD;  Location: [facility] ENDOSCOPY;  Service: Gastroenterology;  Laterality: N/A;    Social History History  Substance Use Topics  . Smoking status: Former Smoker    Types: Cigarettes    Quit date: 06/01/2010  . Smokeless tobacco: Never Used  . Alcohol Use: No    Family History Family History  Problem Relation Age of Onset  . Stroke Neg Hx   . Cancer Neg Hx   . Colon cancer Neg Hx   . Anesthesia problems Neg Hx   . Hypotension Neg Hx   . Malignant hyperthermia Neg Hx   . Pseudochol deficiency Neg Hx   . Heart disease Mother   . Hypertension Mother     Allergies  Allergen Reactions  . Milk-Related Compounds   . Aspirin Rash     REVIEW OF SYSTEMS  General: [ ]  Weight loss, [ ]  Fever, [ ]  chills Neurologic: [x ] Dizziness, [ ]  Blackouts, [ ]  Seizure [ ]  Stroke, [ ]  "Mini stroke", [ ]  Slurred speech, [ ]  Temporary blindness; [ ]  weakness in arms or legs, [ ]  Hoarseness [ ]  Dysphagia Cardiac: [ ]  Chest pain/pressure, [ ]  Shortness of breath at rest [ ]  Shortness of breath with exertion, [ ]  Atrial fibrillation or irregular heartbeat  Vascular: [x Pain in legs with walking, [x ] Pain in legs at rest, [x ] Pain in legs at night,  [ ]  Non-healing ulcer, [x ] HX of Blood clot in vein/DVT,   Pulmonary: [ ]  Home oxygen, [ ]  Productive cough, [ ]  Coughing up blood, [ ]  Asthma,  [ ]  Wheezing [ ]  COPD Musculoskeletal:  [ ]  Arthritis, [ ]  Low back pain, [ ]  Joint pain Hematologic: [ ]  Easy Bruising, [x ] Anemia; [ ]  Hepatitis Gastrointestinal: [ ]  Blood in stool, [x ] Gastroesophageal Reflux/heartburn, Urinary: [ ]  chronic Kidney disease, [ ]  on HD - [ ]  MWF or [ ]  TTHS, [ ]  Burning with  urination, [ ]  Difficulty urinating Skin: [x ] leftcalf Rashes, [ ]  Wounds Psychological: [x ] Anxiety, [x ] Depression  Physical Examination Filed Vitals:   12/17/12 1610 12/18/12 0556 12/18/12  1000 12/18/12 1300  BP: 117/56 118/68 134/76 128/82  Pulse: 62 66 69 72  Temp: 98.6 F (37 C) 98.7 F (37.1 C) 98 F (36.7 C) 98.2 F (36.8 C)  TempSrc: Oral Oral Oral Oral  Resp: 18 18 18 18   Height: 5' (1.524 m)     Weight: 138 lb 3.2 oz (62.687 kg)     SpO2: 100% 100% 100% 100%   Body mass index is 26.99 kg/(m^2).  General:  WDWN in NAD HENT: WNL Eyes: Pupils equal Pulmonary: normal non-labored breathing , without Rales, rhonchi,  wheezing Cardiac: RRR, without  Murmurs, rubs or gallops; No carotid bruits Abdomen: soft, NT, no masses Skin: positive darkened skin left calf and ankle rashes with palpable cord that is very tender to palpation, no ulcers noted;  no Gangrene , no cellulitis; no open wounds;   Vascular Exam/Pulses:Palpable DP/PT, femoral pulses.  Palpable radial and brachial pulses.   Musculoskeletal: no muscle wasting or atrophy; no edema  Neurologic: A&O X 3; Appropriate Affect ;  SENSATION: normal; MOTOR FUNCTION: 5/5 Symmetric Speech is fluent/normal   Significant Diagnostic Studies: CBC Lab Results  Component Value Date   WBC 5.6 12/18/2012   HGB 8.2* 12/18/2012   HCT 24.0* 12/18/2012   MCV 85.7 12/18/2012   PLT 269 12/18/2012    BMET    Component Value Date/Time   NA 138 12/18/2012 0445   K 4.4 12/18/2012 0445   CL 105 12/18/2012 0445   CO2 27 12/18/2012 0445   GLUCOSE 100* 12/18/2012 0445   BUN 16 12/18/2012 0445   CREATININE 1.00 12/18/2012 0445   CREATININE 0.87 12/14/2012 0943   CALCIUM 9.2 12/18/2012 0445   GFRNONAA 53* 12/18/2012 0445   GFRAA 61* 12/18/2012 0445   Estimated Creatinine Clearance: 38.4 ml/min (by C-G formula based on Cr of 1).  COAG Lab Results  Component Value Date   INR 1.16 12/18/2012   INR 1.28 12/17/2012   INR 1.89*  12/16/2012     Non-Invasive Vascular Imaging:  Left Lower Extremity Venous Duplex Evaluation  Summary: 09/28/2012  - Findings consistent with acute deep vein thrombosis involving the soleal vein of theleft lower extremity. There is also superficial thrombophlebitis noted in multiple varicosities of the left posterior calf. - No evidence of Baker's cyst on the left. Other specific details can be found in the table(s) above.  Left Lower Extremity Venous Duplex Evaluation  Summary: 12/15/2012  - No evidence of deep vein thrombosis involving the left lower extremity. - No evidence of Baker's cyst on the left. Other specific details can be found in the table(s) above.     ASSESSMENT/PLAN:  Resolved left soleal vein DVT since June Left calf pain of origin unknown Continue to treat symptomatically Dr. Arbie Cookey will see the patient and give any further recommendations    Thomasena Edis [facility] 12/18/2012 2:50 PM

## 2012-12-19 DIAGNOSIS — M79609 Pain in unspecified limb: Secondary | ICD-10-CM

## 2012-12-19 DIAGNOSIS — I8010 Phlebitis and thrombophlebitis of unspecified femoral vein: Secondary | ICD-10-CM

## 2012-12-19 LAB — BASIC METABOLIC PANEL
BUN: 14 mg/dL (ref 6–23)
Calcium: 9.2 mg/dL (ref 8.4–10.5)
Creatinine, Ser: 0.93 mg/dL (ref 0.50–1.10)
GFR calc Af Amer: 66 mL/min — ABNORMAL LOW (ref >90)
GFR calc non Af Amer: 57 mL/min — ABNORMAL LOW (ref >90)

## 2012-12-19 LAB — PROTIME-INR: Prothrombin Time: 14.6 seconds (ref 11.6–15.2)

## 2012-12-19 LAB — CBC WITH DIFFERENTIAL/PLATELET
Basophils Absolute: 0 10*3/uL (ref 0.0–0.1)
Basophils Relative: 0 % (ref 0–1)
Eosinophils Absolute: 0.1 10*3/uL (ref 0.0–0.7)
MCHC: 33.2 g/dL (ref 30.0–36.0)
Neutro Abs: 3.1 10*3/uL (ref 1.7–7.7)
Neutrophils Relative %: 60 % (ref 43–77)
RDW: 14.1 % (ref 11.5–15.5)

## 2012-12-19 MED ORDER — ACETAMINOPHEN-CODEINE #3 300-30 MG PO TABS: 1.0000 | ORAL_TABLET | ORAL | Status: DC | PRN

## 2012-12-19 MED ORDER — FERROUS SULFATE 325 (65 FE) MG PO TABS: 325.0000 mg | ORAL_TABLET | Freq: Every day | ORAL | Status: DC

## 2012-12-19 MED ORDER — DOCUSATE SODIUM 100 MG PO CAPS: 100.0000 mg | ORAL_CAPSULE | Freq: Two times a day (BID) | ORAL | Status: DC | PRN

## 2012-12-19 MED ORDER — HYDROCODONE-ACETAMINOPHEN 5-325 MG PO TABS: 1.0000 | ORAL_TABLET | ORAL | Status: DC | PRN

## 2012-12-19 MED ORDER — SUMATRIPTAN SUCCINATE 25 MG PO TABS: 25.0000 mg | ORAL_TABLET | Freq: Four times a day (QID) | ORAL | Status: DC | PRN

## 2012-12-19 MED ORDER — PANTOPRAZOLE SODIUM 40 MG PO TBEC: 40.0000 mg | DELAYED_RELEASE_TABLET | Freq: Every day | ORAL | Status: DC

## 2012-12-19 NOTE — Discharge Summary (Signed)
Patient Name:  Sherri Mcmillan  MRN: [id number]  PCP: Dede Query, MD  DOB:  [birth year]       Date of Admission:  12/14/2012  Date of Discharge:  12/19/2012      Attending Physician: Dr. Farley Ly, MD       DISCHARGE DIAGNOSES:   Acute blood loss anemia   Venous insufficiency   HTN (hypertension)   Aortic stenosis   Barrett's esophagus    DISPOSITION AND FOLLOW-UP: Sherri Mcmillan is to follow-up with the listed providers as detailed below, at patient's visiting, please address following issues:  1. Please check CBC for Hgb level. 2. Please check her bp to decide whether she needs to resume her lasix and amlodipine which were held at discharge due to recent GI bleeding and normal bp.  Follow-up Information   Follow up with Melvia Heaps, MD. (you need to follow up with GI doctor, Dr. Arlyce Dice. please call his office [phone] for a follow up appointment in two weeks.)    Specialty:  Gastroenterology   Contact information:   [address] [place] [address] [phone]       Follow up with Gust Rung, DO On 12/26/2012. (Please see Dr. Mikey Bussing at [facility] on September 2nd for your hospital follow up visit.)    Specialty:  Internal Medicine   Contact information:   [address] [place] [address] [phone]      Discharge Orders   Future Appointments Provider Department Dept Phone   12/26/2012 9:00 AM Carlynn Purl, DO [facility] [phone]   Future Orders Complete By Expires   Compression stockings  As directed      DISCHARGE MEDICATIONS:   Medication List    STOP taking these medications       amLODipine 10 MG tablet  Commonly known as:  NORVASC     furosemide 40 MG tablet  Commonly known as:  LASIX     omeprazole-sodium bicarbonate 40-1100 MG per capsule  Commonly known as:  ZEGERID     warfarin 5 MG tablet  Commonly known as:  COUMADIN      TAKE these medications       acetaminophen-codeine 300-30 MG per  tablet  Commonly known as:  TYLENOL #3  Take 1 tablet by mouth every 4 (four) hours as needed for pain.     citalopram 40 MG tablet  Commonly known as:  CELEXA  Take 40 mg by mouth daily.     docusate sodium 100 MG capsule  Commonly known as:  COLACE  Take 1 capsule (100 mg total) by mouth 2 (two) times daily as needed for constipation.     ferrous sulfate 325 (65 FE) MG tablet  Take 1 tablet (325 mg total) by mouth daily with breakfast.     HYDROcodone-acetaminophen 5-325 MG per tablet  Commonly known as:  NORCO/VICODIN  Take 1 tablet by mouth every 4 (four) hours as needed for pain.     pantoprazole 40 MG tablet  Commonly known as:  PROTONIX  Take 1 tablet (40 mg total) by mouth daily.     promethazine 12.5 MG tablet  Commonly known as:  PHENERGAN  Take 1 tablet (12.5 mg total) by mouth every 6 (six) hours as needed for nausea.     sucralfate 1 G tablet  Commonly known as:  CARAFATE  Take 1 tablet (1 g total) by mouth 4 (four) times daily.  CONSULTS: GI Treatment Team:  Larina Earthly, MD    PROCEDURES PERFORMED:  Dg Chest 2 View  12/14/2012   *RADIOLOGY REPORT*  Clinical Data: Hemoptysis  CHEST - 2 VIEW  Comparison: December 02, 2012.  Findings: Cardiomediastinal silhouette appears normal.  No acute pulmonary disease is noted.  Bony thorax is intact.  IMPRESSION: No acute cardiopulmonary abnormality seen.   Original Report Authenticated By: Lupita Raider.,  M.D.   Dg Chest 2 View  12/02/2012   *RADIOLOGY REPORT*  Clinical Data: Hemoptysis post fall.  CHEST - 2 VIEW  Comparison: 09/16/2012  Findings: No pneumothorax.  No effusion.  No evidence of mediastinal hematoma.  Mild thoracolumbar scoliosis.  No fracture evident.  Lungs clear.  Heart size normal.  IMPRESSION:  No acute disease   Original Report Authenticated By: D. Andria Rhein, MD   Ct Head Wo Contrast  12/02/2012   *RADIOLOGY REPORT*  Clinical Data: Fall 2 days ago.  Hemoptysis.  On Coumadin.  CT HEAD  WITHOUT CONTRAST  Technique:  Contiguous axial images were obtained from the base of the skull through the vertex without contrast.  Comparison: CT 05/16/2012  Findings: Mild atrophy and mild chronic microvascular ischemic change.  No acute infarct.  Negative for intracranial hemorrhage. 6 mm dural calcification left frontal temporal region is unchanged and may represent a calcified meningioma or benign dural calcification.  Negative for skull fracture.  IMPRESSION: No acute abnormality.   Original Report Authenticated By: Janeece Riggers, M.D.     ADMISSION DATA: History of Present Illness:  Patient is a 79 year old woman with history of Barrett's esophagus, aortic stenosis, hypertension, left leg DVT 6/14 (on coumadin), who was admitted for anemia with Hgb of 6.7 and dizziness.  Patient reports that she has noted blood in her mouth, but she is not sure where the blood is coming from. She denies any epistaxis or oral bleeding while brushing her teeth. These episodes of spitting blood occur only at night, proximately occurs 5-6 times nightly. The patient also noted several black stools earlier in the week, however her last stool was green and she was found to be FOBT negative in clinic today. Patient had upper endoscopy on 5/25 which showed a stricture at the gastroesophageal junction, reflux esophagitis, and a 4cm hiatal hernia. Gastroesophageal biopsies showed "inflamed gastroesophageal junctional mucosa with surface fibrinous material, suggestive of ulcer bed". She has been taking Zegerid at home.  She has light headedness and significant fatigue with some SOB for the past few days. Patient denies chest pain. Currently, she does no have nausea or abdominal pain.   Denies fever, chills, cough, chest pain, SOB,  abdominal pain, dysuria, urgency, frequency, hematuria.  Physical Exam:     Filed Vitals:     12/14/12 1951  12/14/12 1953  12/14/12 1957  12/14/12 1959   BP:  128/70  127/66  133/68  134/70     Pulse:  76  66  73  68   Temp:           TempSrc:           Resp:  18  18  18  18    Height:           Weight:           SpO2:             General: Not in acute distress HEENT: PERRL, EOMI, no scleral icterus, No JVD or bruit Cardiac: S1/S2, RRR, 2/6 of systolic murmur  heart over RUSB,  No gallops or rubs Pulm: Good air movement bilaterally. Clear to auscultation bilaterally. No rales, wheezing, rhonchi or rubs. Abd: Soft, nondistended, nontender, no rebound pain, no organomegaly, BS present Ext: Mild swelling in left lower extremity. Mild tenderness over the left ankle and calf. No edema of right lower extremity. 2+DP/PT pulse bilaterally Musculoskeletal: No joint deformities, erythema, or stiffness, ROM full Skin: No rashes.   Neuro: Alert and oriented X3, cranial nerves II-XII grossly intact, muscle strength 5/5 in all extremeties, sensation to light touch intact.   Psych: Patient is not psychotic, no suicidal or hemocidal ideation.  Lab results: Basic Metabolic Panel:  Recent Labs   12/14/12 0943   NA  141   K  3.5   CL  106   CO2  27   GLUCOSE  96   BUN  14   CREATININE  0.87   CALCIUM  9.1    Liver Function Tests: No results found for this basename: AST, ALT, ALKPHOS, BILITOT, PROT, ALBUMIN,  in the last 72 hours No results found for this basename: LIPASE, AMYLASE,  in the last 72 hours No results found for this basename: AMMONIA,  in the last 72 hours CBC:  Recent Labs   12/14/12 0943  12/14/12 1439  12/14/12 1656   WBC  4.3  4.2   --    NEUTROABS   --    --   2.4   HGB  6.7*  7.0*   --    HCT  20.1*  20.6*   --    MCV  85.5  86.2   --    PLT  274  269   --     Cardiac Enzymes:  Recent Labs   12/14/12 1439   TROPONINI  <0.30    BNP:  Recent Labs   12/14/12 1439   PROBNP  646.7*    HOSPITAL COURSE:  1. Acute blood loss anemia - Sherri Mcmillan presented to the clinic for a follow up appointment with GI and labs were drawn 2 days prior to  admission. Hgb dropped from 7.9 on 8/13 to 6.6 on 8/19. Her Hgb was 11/0 on 12/02/12. The patient was notified by phone and returned to [facility] for admission to the hospital. This acute anemia is likely due to blood loss because there was no laboratory evidence of hemolysis, the patient reports spitting small amounts of blood at night along with melenic stools, and because of the acute nature of the decreased red count (Hb dropping almost 4g in 11 days). No bleeding was noted in the hospital, with two FOBT negative stools, and only a small amount of blood (estimated less than one tsp total) spit up at night. Patient received one unit of PRBCs on first hospital day. ENT and GI were consulted and the patient underwent endoscopy with no findings of acute bleeding. She was transfused with one unit of blood. Hb was stable throughout hospitalization with last value of 8.3 at discharge. Ferrous sulfate 325mg  TID was started for this anemia in the setting of iron deficiency and Colace was also begun to avoid constipation. Patient was discharged to home on Protonix with follow up appointment with Dr. Mikey Bussing at [facility] in 1 week on September 2nd. Also the patient will return to her GI physician, Dr. Arlyce Dice, on Septermber 23rd.  2. Hypertension- patient was noted to be hypotensive on admission and both Lisinopril and furosemide were held in the setting of anemia.  On 8/23 patient began to complain of swelling in her hand for which she reportedly took the furosemide, so furosemide was given once, but Lisinopril was held due to her normotensive blood pressures. On discharge both Lisinopril and furosemide were held pending reevaluation of the patient at her hospital follow up appointment in 1 week.   3. DVT and thrombophlebitis - Sherri Mcmillan was on Coumadin for a previously noted soleal DVT for which she had been treated for 11 weeks on admission. Coumadin was discontinued and she was reversed with vitamin K in  the setting of anemia and to facilitate endoscopic procedures by GI and ENT. Repeat Doppler ultrasound showed no evidence of DVT of legs bilaterally. INR fell below 1.5 on 8/24 and patient underwent endoscopy by GI on that date. Given her near completion of recommended 3 months of treatment for DVT and the resolution of her DVT, at discharge Coumadin was not restarted. On 8/25 the patient was seen by Vascular surgery for evaluation of the painful superficial veins in her left calf. Their findings were significant for subacute superficial thrombophlebitis and Dr Arbie Cookey advised the patient to elevate her leg and wear compression stockings which she has at home. Dr. Arbie Cookey also agreed with discontinuation of Coumadin. The patient was discharged with instructions to contact Dr. Arbie Cookey if her symptoms worsen.   4. Headache - Patient complains of long standing migraine headaches for which she was taking tramadol at home with little relief. A trial of sumatriptan was successful and the patient's dose was increased from 25mg  bid to 50mg . At discharge headaches were well controlled with sumatriptan and the patient was given a prescription with instructions to follow up with Dr. Mikey Bussing in the [facility].   Addendum: 12/20/12:  Regarding her headache, patient was treated with sumatriptan for possible migraine in the hospital. Her headache subsided. At discharge, sumatriptan was discontinued. Patient was discharged on acetaminophen-codeine 300-30 MG per tablet q4h prn.    DISCHARGE DATA: Vital Signs: BP 124/64  Pulse 70  Temp(Src) 99.2 F (37.3 C) (Oral)  Resp 18  Ht 5' (1.524 m)  Wt 138 lb 3.2 oz (62.687 kg)  BMI 26.99 kg/m2  SpO2 100%  LMP 04/26/1950  Labs: Results for orders placed during the hospital encounter of 12/14/12 (from the past 24 hour(s))  PROTIME-INR     Status: None   Collection Time    12/19/12  5:24 AM      Result Value Range   Prothrombin Time 14.6  11.6 - 15.2 seconds   INR 1.16  0.00 -  1.49  BASIC METABOLIC PANEL     Status: Abnormal   Collection Time    12/19/12  5:24 AM      Result Value Range   Sodium 140  135 - 145 mEq/L   Potassium 4.1  3.5 - 5.1 mEq/L   Chloride 106  96 - 112 mEq/L   CO2 26  19 - 32 mEq/L   Glucose, Bld 95  70 - 99 mg/dL   BUN 14  6 - 23 mg/dL   Creatinine, Ser 4.09  0.50 - 1.10 mg/dL   Calcium 9.2  8.4 - 81.1 mg/dL   GFR calc non Af Amer 57 (*) >90 mL/min   GFR calc Af Amer 66 (*) >90 mL/min  CBC WITH DIFFERENTIAL     Status: Abnormal   Collection Time    12/19/12  5:24 AM      Result Value Range   WBC 5.2  4.0 -  10.5 K/uL   RBC 2.93 (*) 3.87 - 5.11 MIL/uL   Hemoglobin 8.3 (*) 12.0 - 15.0 g/dL   HCT 16.1 (*) 09.6 - 04.5 %   MCV 85.3  78.0 - 100.0 fL   MCH 28.3  26.0 - 34.0 pg   MCHC 33.2  30.0 - 36.0 g/dL   RDW 40.9  81.1 - 91.4 %   Platelets 274  150 - 400 K/uL   Neutrophils Relative % 60  43 - 77 %   Neutro Abs 3.1  1.7 - 7.7 K/uL   Lymphocytes Relative 29  12 - 46 %   Lymphs Abs 1.5  0.7 - 4.0 K/uL   Monocytes Relative 10  3 - 12 %   Monocytes Absolute 0.5  0.1 - 1.0 K/uL   Eosinophils Relative 1  0 - 5 %   Eosinophils Absolute 0.1  0.0 - 0.7 K/uL   Basophils Relative 0  0 - 1 %   Basophils Absolute 0.0  0.0 - 0.1 K/uL     Services Ordered on Discharge: Y = Yes; Blank = No PT: HHPT  OT:   RN:   Equipment:   Other:     Time Spent on Discharge: 35 min   Signed: Lorretta Harp, MD PGY 2, Internal Medicine Resident 12/19/2012, 9:16 AM

## 2012-12-19 NOTE — Progress Notes (Signed)
Internal Medicine Attending  Date: 12/19/2012  Patient name: Sherri Mcmillan Medical record number: [id number] Date of birth: 1932-05-16 Age: 79 y.o. Gender: female  I saw and evaluated the patient. I reviewed the resident's note by Dr. Clyde Lundborg and I agree with the resident's findings and plans as documented in his note, except as follows.  Would not continue Imitrex at discharge, since it is not clear that we are treating migraine headaches and patient does not seem to understand that this is not an analgesic medication.  Patient reports that Tylenol provides some relief but not complete relief; would consider an alternative analgesic such as Tylenol #3 as a PRN medication in the short term, with followup in the outpatient clinic.  I discussed this with bridge resident Dr. Manson Passey and with medical student.

## 2012-12-19 NOTE — Progress Notes (Signed)
Physical Therapy Treatment Patient Details Name: Jennette Leask MRN: [id number] DOB: Apr 08, 1933 Today's Date: 12/19/2012 Time: 2130-8657 PT Time Calculation (min): 15 min  PT Assessment / Plan / Recommendation  History of Present Illness Patient is a 79 yo female admitted with dizziness, weakness, and poor balance x10 days.  Patient with anemia and hypotension.  Patient with h/o DVT LLE 3 months ago.   PT Comments   Pt ambulated in hallway holding onto hand rail or provided 1 HHA, and pt declined assistive device.  Follow Up Recommendations  Home health PT;Supervision for mobility/OOB     Does the patient have the potential to tolerate intense rehabilitation     Barriers to Discharge        Equipment Recommendations  None recommended by PT    Recommendations for Other Services    Frequency     Progress towards PT Goals Progress towards PT goals: Progressing toward goals  Plan Current plan remains appropriate    Precautions / Restrictions Precautions Precautions: Fall   Pertinent Vitals/Pain Elevated LEs upon return to recliner    Mobility  Bed Mobility Bed Mobility: Supine to Sit Supine to Sit: 6: Modified independent (Device/Increase time);With rails;HOB elevated Transfers Transfers: Sit to Stand;Stand to Sit Sit to Stand: 4: Min guard;With upper extremity assist;From chair/3-in-1 Stand to Sit: 4: Min guard;With upper extremity assist;To chair/3-in-1 Details for Transfer Assistance: verbal cues for hand placement Ambulation/Gait Ambulation/Gait Assistance: 4: Min guard Ambulation Distance (Feet): 90 Feet Assistive device: Rolling walker Ambulation/Gait Assistance Details: short seated rest break required, pt declined RW today and instead used hand rail or 1 HHA in hallway Gait Pattern: Step-through pattern;Decreased stride length;Antalgic Gait velocity: Slow gait speed    Exercises     PT Diagnosis:    PT Problem List:   PT Treatment Interventions:     PT Goals  (current goals can now be found in the care plan section)    Visit Information  Last PT Received On: 12/19/12 Assistance Needed: +1 History of Present Illness: Patient is a 79 yo female admitted with dizziness, weakness, and poor balance x10 days.  Patient with anemia and hypotension.  Patient with h/o DVT LLE 3 months ago.    Subjective Data      Cognition  Cognition Arousal/Alertness: Awake/alert Behavior During Therapy: WFL for tasks assessed/performed Overall Cognitive Status: Within Functional Limits for tasks assessed    Balance     End of Session PT - End of Session Equipment Utilized During Treatment: Gait belt Activity Tolerance: Patient limited by pain;Patient limited by fatigue Patient left: with call bell/phone within reach;in chair   GP     ,KATHrine E 12/19/2012, 9:59 AM Zenovia Jarred, PT, DPT 12/19/2012 Pager: [phone]

## 2012-12-19 NOTE — Progress Notes (Signed)
Subjective:  - Patient feels fine. No nausea or abdominal pain. - Feels headache coming on and has requested prn sumatriptan. - No fever or chills. - Patient was tearful yesterday during rounds and in the late afternoon, she appeared to be concerned about the pain she is experiencing in her calf, but was unable to voice specific concerns.  This morning after Dr. Bosie Helper visit the patient is more upbeat and endorses readiness to leave the hospital. - Hgb 8.6-->8.5-->8.2-->8.3 this am - INR: 1.16 this am, stable compared to yesterday.  Recent Labs Lab 12/15/12 0615 12/16/12 0430 12/17/12 0430 12/18/12 0445 12/19/12 0524  INR 2.24* 1.89* 1.28 1.16 1.16   Objective: Vital signs in last 24 hours: Filed Vitals:   12/18/12 1741 12/18/12 2105 12/19/12 0601 12/19/12 0936  BP: 131/69 116/62 124/64 126/65  Pulse: 69 65 70 63  Temp: 99.3 F (37.4 C) 98.8 F (37.1 C) 99.2 F (37.3 C) 99 F (37.2 C)  TempSrc: Oral Oral Oral Oral  Resp: 18 18 18 18   Height:  5' (1.524 m)    Weight:  62.687 kg (138 lb 3.2 oz)    SpO2: 100% 100% 100% 100%   Weight change: 0 kg (0 lb)  Intake/Output Summary (Last 24 hours) at 12/19/12 1029 Last data filed at 12/19/12 0900  Gross per 24 hour  Intake    720 ml  Output   1050 ml  Net   -330 ml    General: Not in acute distress, resting comfortably in bed  HEENT: PERRL, no scleral icterus  Cardiac: S1/S2, RRR, 2/6 of systolic murmur over aortic valve area. Pulm: Good air movement bilaterally. Faint expiratory wheezes heard throughout. No increased work of breathing.  Abd: Soft, nondistended, nontender, no rebound pain, no organomegaly, BS present  Ext: Mild swelling in left lower extremity. Mild tenderness over the calf. No edema of right lower extremity. Palpable cords in calf, unchanged from previous exams. Skin: No rashes.  Neuro: Alert and oriented X3  Physical Exam:   Filed Vitals:   12/18/12 1741 12/18/12 2105 12/19/12 0601 12/19/12 0936   BP: 131/69 116/62 124/64 126/65  Pulse: 69 65 70 63  Temp: 99.3 F (37.4 C) 98.8 F (37.1 C) 99.2 F (37.3 C) 99 F (37.2 C)  TempSrc: Oral Oral Oral Oral  Resp: 18 18 18 18   Height:  5' (1.524 m)    Weight:  62.687 kg (138 lb 3.2 oz)    SpO2: 100% 100% 100% 100%    Lab Results: Basic Metabolic Panel:  Recent Labs Lab 12/18/12 0445 12/19/12 0524  NA 138 140  K 4.4 4.1  CL 105 106  CO2 27 26  GLUCOSE 100* 95  BUN 16 14  CREATININE 1.00 0.93  CALCIUM 9.2 9.2   Liver Function Tests:  Recent Labs Lab 12/15/12 0615  AST 18  ALT 10  ALKPHOS 59  BILITOT 0.5  PROT 6.6  ALBUMIN 3.5   No results found for this basename: LIPASE, AMYLASE,  in the last 168 hours No results found for this basename: AMMONIA,  in the last 168 hours CBC:  Recent Labs Lab 12/14/12 1656  12/18/12 0445 12/19/12 0524  WBC  --   < > 5.6 5.2  NEUTROABS 2.4  --   --  3.1  HGB  --   < > 8.2* 8.3*  HCT  --   < > 24.0* 25.0*  MCV  --   < > 85.7 85.3  PLT  --   < >  269 274  < > = values in this interval not displayed. Cardiac Enzymes:  Recent Labs Lab 12/14/12 1439  TROPONINI <0.30   BNP:  Recent Labs Lab 12/14/12 1439  PROBNP 646.7*   D-Dimer: No results found for this basename: DDIMER,  in the last 168 hours CBG:  Recent Labs Lab 12/16/12 2044  GLUCAP 124*   Hemoglobin A1C: No results found for this basename: HGBA1C,  in the last 168 hours Fasting Lipid Panel: No results found for this basename: CHOL, HDL, LDLCALC, TRIG, CHOLHDL, LDLDIRECT,  in the last 168 hours Thyroid Function Tests: No results found for this basename: TSH, T4TOTAL, FREET4, T3FREE, THYROIDAB,  in the last 168 hours Coagulation:  Recent Labs Lab 12/16/12 0430 12/17/12 0430 12/18/12 0445 12/19/12 0524  LABPROT 21.1* 15.7* 14.6 14.6  INR 1.89* 1.28 1.16 1.16   Anemia Panel:  Recent Labs Lab 12/14/12 1439 12/15/12 0615  VITAMINB12 426  --   FOLATE 16.5  --   FERRITIN 11  --   TIBC 352   --   IRON 13*  --   RETICCTPCT 2.3 2.3   Urine Drug Screen: Drugs of Abuse  No results found for this basename: labopia,  cocainscrnur,  labbenz,  amphetmu,  thcu,  labbarb    Alcohol Level: No results found for this basename: ETH,  in the last 168 hours Urinalysis: No results found for this basename: COLORURINE, APPERANCEUR, LABSPEC, PHURINE, GLUCOSEU, HGBUR, BILIRUBINUR, KETONESUR, PROTEINUR, UROBILINOGEN, NITRITE, LEUKOCYTESUR,  in the last 168 hours Misc. Labs:  Micro Results: No results found for this or any previous visit (from the past 240 hour(s)). Studies/Results: No results found. Medications:  Scheduled Meds:  citalopram  40 mg Oral Daily   heparin subcutaneous  5,000 Units Subcutaneous Q8H   pantoprazole  40 mg Oral Daily   sodium chloride  3 mL Intravenous Q12H   Continuous Infusions:  sodium chloride 20 mL/hr at 12/17/12 0730   PRN Meds:.HYDROcodone-acetaminophen, ondansetron (ZOFRAN) IV, ondansetron, SUMAtriptan   Assessment/Plan:   #: Symptomatic anemia.  Etiology is not clear. Most likely due to acute blood loss on chronic anemia, though no obvious source of bleeding on endoscopy and evaluation by medicine, ENT and GI. Her Hgb decreased from 10.3 on 12/02/2012 to 6.7 8/21 over the course of 12 days. GI source seems likely given patient's report of melenic stools, although she has been FOBT negative x 2 during this hospitalization.  Patient's Hgb has been stable after received 1 unit of blood.  It is unlikely that patient has hemolysis given normal haptoglobin and total bilirubin. - Appreciate ENT and GI consultation in managing our patient.   - continue sucralfate and Protonix 40 mg qd as outpatient  # Hypertension. Last charted BP 118/68. Amlodipine and lasix are being held in this normotensive patient with a history of falls and subjective lightheadedness in the setting of anemia.   -will continue to hold amlodipine and lasix in setting of anemia with  some lightheadedness. -patient was counseled not to drive until her appointment next week for reevaluation at hospital follow up (endorses some lightheadedness prior to admission for which she had to pull off to the side of the road at least once).  # DVT - Patient was diagnosed with DVT 11 weeks ago and started on coumadin therapy, CHEST guidelines recommend 3 months of treatment and reevaluation for distal DVT. She almost completed her course of coumadin. Repeated LE doppler has no DVT. Lower left leg is tender to palpation, which  is likely due to superficial venous thrombus. - Warfarin d/c'd on 8/22  - SQ heparin for DVT prophylaxis - will not restart coumadin on discharge. Will follow up in clinic for re-assement after discharge.   # Headache - Patient has hx of migraines. She took tramadol at home without help. She responded to low dose of sumatriptan - continue Sumitriptan 25 mg q6h prn   # Superficial Thrombophlebitis - This issue has been going on for several months now, patient is experiencing significant pain.  Dr. Arbie Cookey of Vascular surgery saw patient and his findings were suggestive of subacute superficial thrombophlebitis.  He recommended elevation of the affected leg and compression stockings.  Mrs. Docter is somewhat hesitant to use compression stockings due to discomfort, but endorses agreement with the suggestion to elevate her legs. -Patient will follow up with Dr. Arbie Cookey if symptoms worsen or do not improve.  # DVT PPx:  SQ heparin  #  F/E/N   -SL -Low fiber diet per GI  Disposition: Discharge to home today.    LOS: 5 days   Festus Holts 12/19/2012, 10:29 AM

## 2012-12-19 NOTE — Consult Note (Signed)
I have examined the patient, reviewed and agree with above. The patient had does have a documented history of DVT several months ago in her left leg. At that time she had no evidence of clot in her great saphenous vein. She did have a repeat duplex at this visit in the hospital showing no evidence of DVT. I agree that it is appropriate to discontinue her anticoagulation therapy.  On physical exam she does have significant venous hypertension changes in her left leg with hemosiderin deposits circumferentially around her lower leg. She does have varicosities in her left posterior calf. There is thickening in this area and she probably does have subacute superficial thrombophlebitis. Had a long discussion with the patient explaining that this does not pose any danger to her. She does have a palpable left posterior tibial pulse and I explained that her arterial circulation is normal. I did explain the importance of elevation and compression. She does remain quite active. I explained the importance of compression when possible. She reports these does do call to or increased discomfort. She understands the importance of elevation with her feet higher than her heart and that sitting with her legs up to very little to help with her venous hypertension. She will see [place] on an as-needed basis  , , MD 12/19/2012 8:51 AM

## 2012-12-19 NOTE — Progress Notes (Signed)
Patient was discharged home with niece. Patient was given discharge instructions regarding medications and appointments. Patient was given prescriptions. Patient was told to contact doctor with questions and concerns. Patient was stable upon discharge.

## 2012-12-19 NOTE — Progress Notes (Signed)
I have seen the patient and reviewed the daily progress note by medical student,  Josh and discussed the care of the patient with them. Please see my note for documentation of my findings, assessment, and plans.    , MD PGY3, Internal Medicine Teaching Service Pager: [phone]   

## 2012-12-19 NOTE — Progress Notes (Signed)
Talked to patient about home health care choices, patient chose [facility]; Lupita Leash with [facility] called for arrangements. Patient also stated that she will be staying with a friend - address- [address] [place]; cell number is [phone]; Abelino Derrick RN,BSN,MHA

## 2012-12-19 NOTE — Progress Notes (Signed)
Subjective:  - Patient feels fine. No nausea or abdominal pain.  - Patient still has pain over the L calf area. - No fever or chills.  - Hgb 8.6-->8.5-->8.2-->8.3 - Repeated LE doppler: showed no evidence of DVT.  - EGD: No bleeding source or evidence of recent bleeding found. There was evidence of suspected short segment Barrett's esophagus. Nonobstructing Schatzki ring was found 36 cm from the incisors - INR: 1.16   Recent Labs Lab 12/15/12 0615 12/16/12 0430 12/17/12 0430 12/18/12 0445 12/19/12 0524  INR 2.24* 1.89* 1.28 1.16 1.16   Objective: Vital signs in last 24 hours: Filed Vitals:   12/18/12 1300 12/18/12 1741 12/18/12 2105 12/19/12 0601  BP: 128/82 131/69 116/62 124/64  Pulse: 72 69 65 70  Temp: 98.2 F (36.8 C) 99.3 F (37.4 C) 98.8 F (37.1 C) 99.2 F (37.3 C)  TempSrc: Oral Oral Oral Oral  Resp: 18 18 18 18   Height:   5' (1.524 m)   Weight:   138 lb 3.2 oz (62.687 kg)   SpO2: 100% 100% 100% 100%   Weight change: 0 lb (0 kg)  Intake/Output Summary (Last 24 hours) at 12/19/12 0915 Last data filed at 12/19/12 0604  Gross per 24 hour  Intake    480 ml  Output    800 ml  Net   -320 ml    General: Not in acute distress, resting comfortably in bed  HEENT: PERRL, no scleral icterus  Cardiac: S1/S2, RRR, 2/6 of systolic murmur over aortic valve area. Pulm: Good air movement bilaterally. Faint expiratory wheezes heard best in lower lung fields. No increased work of breathing.  Abd: Soft, nondistended, nontender, no rebound pain, no organomegaly, BS present  Ext: Mild swelling in left lower extremity. Tenderness over the calf. No edema of right lower extremity. 2+DP/PT pulse bilaterally  Musculoskeletal: No joint deformities, erythema, or stiffness, ROM full Skin: No rashes.  Neuro: Alert and oriented X3  Physical Exam:   Filed Vitals:   12/18/12 1300 12/18/12 1741 12/18/12 2105 12/19/12 0601  BP: 128/82 131/69 116/62 124/64  Pulse: 72 69 65 70  Temp:  98.2 F (36.8 C) 99.3 F (37.4 C) 98.8 F (37.1 C) 99.2 F (37.3 C)  TempSrc: Oral Oral Oral Oral  Resp: 18 18 18 18   Height:   5' (1.524 m)   Weight:   138 lb 3.2 oz (62.687 kg)   SpO2: 100% 100% 100% 100%   Lab Results: Basic Metabolic Panel:  Recent Labs Lab 12/18/12 0445 12/19/12 0524  NA 138 140  K 4.4 4.1  CL 105 106  CO2 27 26  GLUCOSE 100* 95  BUN 16 14  CREATININE 1.00 0.93  CALCIUM 9.2 9.2   Liver Function Tests:  Recent Labs Lab 12/15/12 0615  AST 18  ALT 10  ALKPHOS 59  BILITOT 0.5  PROT 6.6  ALBUMIN 3.5   No results found for this basename: LIPASE, AMYLASE,  in the last 168 hours No results found for this basename: AMMONIA,  in the last 168 hours CBC:  Recent Labs Lab 12/14/12 1656  12/18/12 0445 12/19/12 0524  WBC  --   < > 5.6 5.2  NEUTROABS 2.4  --   --  3.1  HGB  --   < > 8.2* 8.3*  HCT  --   < > 24.0* 25.0*  MCV  --   < > 85.7 85.3  PLT  --   < > 269 274  < > = values  in this interval not displayed. Cardiac Enzymes:  Recent Labs Lab 12/14/12 1439  TROPONINI <0.30   BNP:  Recent Labs Lab 12/14/12 1439  PROBNP 646.7*   D-Dimer: No results found for this basename: DDIMER,  in the last 168 hours CBG:  Recent Labs Lab 12/16/12 2044  GLUCAP 124*   Hemoglobin A1C: No results found for this basename: HGBA1C,  in the last 168 hours Fasting Lipid Panel: No results found for this basename: CHOL, HDL, LDLCALC, TRIG, CHOLHDL, LDLDIRECT,  in the last 168 hours Thyroid Function Tests: No results found for this basename: TSH, T4TOTAL, FREET4, T3FREE, THYROIDAB,  in the last 168 hours Coagulation:  Recent Labs Lab 12/16/12 0430 12/17/12 0430 12/18/12 0445 12/19/12 0524  LABPROT 21.1* 15.7* 14.6 14.6  INR 1.89* 1.28 1.16 1.16   Anemia Panel:  Recent Labs Lab 12/14/12 1439 12/15/12 0615  VITAMINB12 426  --   FOLATE 16.5  --   FERRITIN 11  --   TIBC 352  --   IRON 13*  --   RETICCTPCT 2.3 2.3   Urine Drug  Screen: Drugs of Abuse  No results found for this basename: labopia,  cocainscrnur,  labbenz,  amphetmu,  thcu,  labbarb    Alcohol Level: No results found for this basename: ETH,  in the last 168 hours Urinalysis: No results found for this basename: COLORURINE, APPERANCEUR, LABSPEC, PHURINE, GLUCOSEU, HGBUR, BILIRUBINUR, KETONESUR, PROTEINUR, UROBILINOGEN, NITRITE, LEUKOCYTESUR,  in the last 168 hours Misc. Labs:  Micro Results: No results found for this or any previous visit (from the past 240 hour(s)). Studies/Results: No results found. Medications:  Scheduled Meds: . citalopram  40 mg Oral Daily  . heparin subcutaneous  5,000 Units Subcutaneous Q8H  . pantoprazole  40 mg Oral Daily  . sodium chloride  3 mL Intravenous Q12H   Continuous Infusions: . sodium chloride 20 mL/hr at 12/17/12 0730   PRN Meds:.HYDROcodone-acetaminophen, ondansetron (ZOFRAN) IV, ondansetron, SUMAtriptan Assessment/Plan:   #: Symptomatic anemia.  Etiology is not clear now. Most likely due to acute blood loss on chronic anemia. Her Hgb decreased from 10.3 on 12/02/2012 to 6.7 8/21. One possibility is hemoptysis.  ENT was consulted. Dr. Jenne Pane performed the fiberoptic laryngoscope , which showed no blood or bleeding source. Dr. Jenne Pane recommended GI consultation. Another positivity is GI bleeding given her hx of possible ulcers and recent black stool.  Patient's Hgb has been stable after received 1 unit of blood.  It is unlikely that patient has hemolysis given normal haptoglobin and total bilirubin. GI consulted, EGD showed no source of bleeding. Patient's HGb has been stable. Hgb is 8.3 today.   -Will d/c home today. -Will start Iron supplement and colace at discharge - Follow up with GI and clinic - continue sucralfate and Protonix 40 mg qd - Hold Amlodipine and Laix  # Hypertension. bp is normal today. Amlodipine and furosemide are being held for hypotension on admission.  -will continue to hold  antihypertensives in setting of anemia and concerns for bleeding, and also her Bp is normal   # DVT - Patient was diagnosed with DVT 11 weeks ago and started on coumadin therapy, CHEST guidelines recommend 3 months of treatment and reevaluation for distal DVT. She almost completed her course of coumadin. Repeated LE doppler has no DVT. Lower left leg is very tender to palpation, which is likely due to superficial venous thrombus, suspect to have thrombophlebitis? [facility] conculted, agreed to discontinue her anticoagulation therapy, recommended compression and elevation of leg.  Will see in prn base.  - Appreciate [facility] consultation - will discontinue coumadin at discharge.   # Headache - Patient has hx of migraines. She took tramadol at home without help. She responded to low dose of sumatriptan. Continue Sumitriptan 25 mg q6h prn  Disposition: d/c home with HHPT   LOS: 5 days   Lorretta Harp 12/19/2012, 9:15 AM

## 2012-12-21 NOTE — Progress Notes (Signed)
Reviewed and agree with management.  D. , M.D., FACG  

## 2012-12-26 ENCOUNTER — Encounter: Payer: Self-pay | Admitting: Internal Medicine

## 2012-12-26 ENCOUNTER — Ambulatory Visit (INDEPENDENT_AMBULATORY_CARE_PROVIDER_SITE_OTHER): Payer: Medicare Other | Admitting: Internal Medicine

## 2012-12-26 VITALS — BP 121/69 | HR 60 | Temp 97.5°F | Ht 60.0 in | Wt 131.5 lb

## 2012-12-26 DIAGNOSIS — F419 Anxiety disorder, unspecified: Secondary | ICD-10-CM

## 2012-12-26 DIAGNOSIS — Z23 Encounter for immunization: Secondary | ICD-10-CM

## 2012-12-26 DIAGNOSIS — F411 Generalized anxiety disorder: Secondary | ICD-10-CM

## 2012-12-26 DIAGNOSIS — I10 Essential (primary) hypertension: Secondary | ICD-10-CM

## 2012-12-26 DIAGNOSIS — I82409 Acute embolism and thrombosis of unspecified deep veins of unspecified lower extremity: Secondary | ICD-10-CM

## 2012-12-26 DIAGNOSIS — I82402 Acute embolism and thrombosis of unspecified deep veins of left lower extremity: Secondary | ICD-10-CM

## 2012-12-26 DIAGNOSIS — D6489 Other specified anemias: Secondary | ICD-10-CM

## 2012-12-26 LAB — CBC
Hemoglobin: 8.7 g/dL — ABNORMAL LOW (ref 12.0–15.0)
MCHC: 32.6 g/dL (ref 30.0–36.0)
Platelets: 248 10*3/uL (ref 150–400)
RBC: 3.07 MIL/uL — ABNORMAL LOW (ref 3.87–5.11)

## 2012-12-26 LAB — BASIC METABOLIC PANEL
Glucose, Bld: 77 mg/dL (ref 70–99)
Potassium: 3.5 mEq/L (ref 3.5–5.3)
Sodium: 144 mEq/L (ref 135–145)

## 2012-12-26 MED ORDER — FUROSEMIDE 40 MG PO TABS: 40.0000 mg | ORAL_TABLET | Freq: Every day | ORAL | Status: DC

## 2012-12-26 MED ORDER — CLONAZEPAM 0.25 MG PO TBDP: 0.2500 mg | ORAL_TABLET | Freq: Two times a day (BID) | ORAL | Status: DC | PRN

## 2012-12-26 NOTE — Assessment & Plan Note (Addendum)
BP Readings from Last 3 Encounters:  12/26/12 121/69  12/19/12 114/70  12/19/12 114/70    Lab Results  Component Value Date   NA 140 12/19/2012   K 4.1 12/19/2012   CREATININE 0.93 12/19/2012    Assessment: Blood pressure control: controlled Progress toward BP goal:  at goal Comments: Patient took 40mg  Lasix last night.  Plan: Medications:  continue current medications, will restart Lasix 40mg  daily Educational resources provided: brochure Self management tools provided:   Other plans: Will restart Lasix 40mg  due to lower extremity edema and patient is at BP goal with taking it last night.  Will have close followup next week for BP recheck.  Will also check BMP today for potassium level.  ADDENDUM 12/27/12, patient's K+ 3.5, will give prescription for KDur , patient has appointment to see me in 1 week.

## 2012-12-26 NOTE — Patient Instructions (Addendum)
1.  Start taking Lasix 40mg  a day. 2.Take Klonopin 0.25 mg twice a day as needed for anxiety. 3.  Please call if your leg becomes more painful or swollen or you become short of breath.

## 2012-12-26 NOTE — Progress Notes (Signed)
I saw and evaluated the patient.  I personally confirmed the key portions of the history and exam documented by Dr. Mikey Bussing and I reviewed pertinent patient test results.  The assessment, diagnosis, and plan were formulated together and I agree with the documentation in the resident's note, with the following additional comments.  Patient reports increased bilateral leg swelling since discharge that she attributes to our having stopped her Lasix; she reports tenderness in her distal lower posterior calf as before, with no significant change.  Exam shows mild increase in her bilateral leg swelling; her left lower extremity tenderness is unchanged, is superficial and located in the distal posterior aspect of the calf.  Her clinical likelihood of recurrent DVT is low based on her exam compared to previous; she declined lower extremity Doppler study today.  The increase in her leg edema is very likely due to our discontinuing furosemide, and I agree with the plan to resume furosemide 40 mg daily.  Patient does report ongoing anxiety to the degree that it interfered with her singing in church yesterday, and seems mildly anxious today; she reports having been on Xanax some time ago.  We discussed the pros and cons of using a benzodiazepine, but given her significant symptoms, I agree with the plan to start clonazepam 0.25 mg twice a day as needed.  We advised patient to let us know if she has any increase in her left lower extremity symptoms, or if she has any problems on the clonazepam.

## 2012-12-26 NOTE — Assessment & Plan Note (Signed)
Patient currently taking Celexa, but has had some anxiety events when singing at church. Will give limited prescription of Klonopin 0.25mg  BIDPRN.

## 2012-12-26 NOTE — Progress Notes (Signed)
Patient ID: Sherri Mcmillan, female   DOB: [birth year], 79 y.o.   MRN: [id number]   Subjective:   Patient ID: Sherri Mcmillan female   DOB: [birth year] 79 y.o.   MRN: [id number]  HPI: Sherri Mcmillan is a 79 y.o. female who presents to the [facility] today for hospital follow up.  Patient was recently hospitalized and transfused 1 unite of PRBC for acute blood loss anemia.  Patient had been taking warfarin for A/C due to LLE DVT.  At time of hospitalization warfarin was held and vit K was given.  Patient had repeat LLE doppler study done which showed resolution of DVT. The decision was made to discontinue Warfarin as patient was close to completing 3 month course of therapy and had no evidence of current DVT.  An EGD completed revealed barretts esophagus but no acute bleed, she as an appointment to follow up with GI and is currently taking Protonix. Since hospitalization patient reports she has been taking her medications as prescribed and denies any current issues with medication.  She denise coughing any blood and denies any blood in stools, although she continues to have melena (taking Iron supplements). She currently complains of increased swelling of both legs that has happened since she was discontinued on Lasix and Amlodipine during the hospitalization due to normotensive BP readings after they were held.  Patient reports that she took one of her 40mg  lasix dose last night to decrease the swelling and her legs are somewhat improved today.  She continues to have a painful LLE that she had in the hospital and for which vascular surgery was consulted.  At that time it was determined to be venous hypertension with some degree of thrombophlebitis. She also reports that her hands were shaking and she has had two episdoes of anxiety.  She has taken Xanax in the past but was discontinued on it because she refused to see a specialists.    Past Medical History  Diagnosis Date  . Chronic pain     DJD knees, back,  migranes, LLE varicose veins, and LLE neuropathy.   . Aortic stenosis     Dx ECHO 2008 , mild and aymptomatic , needs ECHO q 3-5 yrs  . Barrett's esophagus     Demonstrated on EGD 12/2010. EGD 09/17/2012 shows inflamed GE junctional mucosa without metaplasia, dysplasia, or malignancy.  . Chronic insomnia   . Chronic anxiety     Admission to [facility] (90s or early 2000)  for 6 weeks after mother died. Complicated again by death of her sister 2010. 2012 developed hallucinations and I refused to refill controlled meds unless she see psych which she is not agreable to  . Elevated cholesterol 10/11    LDL 155. Her 10 year risk (decreasing her age to 74 as the calculator won't go to age 76) is 21% ish. If consider her non smoker (which I wouldn't even though she says 1 pak lasts 2 weeks) risk is 12% ish. So goal for LDL is 130 or 100.  Marland Kitchen HTN (hypertension)     Controlled with 2 drug therapy  . Migraine   . Cataract   . Depression   . Gastroparesis     Demonstrated on GES 12/2010 by Dr Dalene Seltzer  . Bronchitis     hx of  . Shortness of breath   . Left leg DVT 09/28/2012  . Anemia 12/14/2012   Current Outpatient Prescriptions  Medication Sig Dispense Refill  . acetaminophen-codeine (TYLENOL #3) 300-30 MG per tablet Take  1 tablet by mouth every 4 (four) hours as needed for pain.  45 tablet  0  . citalopram (CELEXA) 40 MG tablet Take 40 mg by mouth daily.      Marland Kitchen docusate sodium (COLACE) 100 MG capsule Take 1 capsule (100 mg total) by mouth 2 (two) times daily as needed for constipation.  60 capsule  2  . ferrous sulfate 325 (65 FE) MG tablet Take 1 tablet (325 mg total) by mouth daily with breakfast.  90 tablet  2  . HYDROcodone-acetaminophen (NORCO/VICODIN) 5-325 MG per tablet Take 1 tablet by mouth every 4 (four) hours as needed for pain.  20 tablet  0  . pantoprazole (PROTONIX) 40 MG tablet Take 1 tablet (40 mg total) by mouth daily.  90 tablet  3  . promethazine (PHENERGAN) 12.5 MG tablet Take 1 tablet  (12.5 mg total) by mouth every 6 (six) hours as needed for nausea.  30 tablet  1  . sucralfate (CARAFATE) 1 G tablet Take 1 tablet (1 g total) by mouth 4 (four) times daily.  120 tablet  4   No current facility-administered medications for this visit.   Family History  Problem Relation Age of Onset  . Stroke Neg Hx   . Cancer Neg Hx   . Colon cancer Neg Hx   . Anesthesia problems Neg Hx   . Hypotension Neg Hx   . Malignant hyperthermia Neg Hx   . Pseudochol deficiency Neg Hx   . Heart disease Mother   . Hypertension Mother    History   Social History  . Marital Status: Single    Spouse Name: N/A    Number of Children: N/A  . Years of Education: N/A   Social History Main Topics  . Smoking status: Former Smoker    Types: Cigarettes    Quit date: 06/01/2010  . Smokeless tobacco: Never Used  . Alcohol Use: No  . Drug Use: No  . Sexual Activity: None   Other Topics Concern  . 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  HENT: Negative for ear pain, congestion and neck pain.   Eyes: Negative for blurred vision.  Respiratory: Negative for cough and shortness of breath.   Cardiovascular: Positive for leg swelling. Negative for chest pain and palpitations.  Gastrointestinal: Negative for heartburn, nausea, vomiting, abdominal pain, diarrhea, constipation and blood in stool.  Genitourinary: Negative for dysuria and urgency.  Musculoskeletal: Negative for myalgias.  Neurological: Negative for dizziness, tingling, sensory change and weakness.    Psychiatric/Behavioral: The patient is nervous/anxious.     Objective:  Physical Exam: Filed Vitals:   12/26/12 0859  BP: 121/69  Pulse: 60  Temp: 97.5 F (36.4 C)  TempSrc: Oral  Height: 5' (1.524 m)  Weight: 131 lb 8 oz (59.648 kg)  SpO2: 100%   Physical Exam  Nursing note and vitals reviewed. Constitutional: She is oriented to person, place, and time and well-developed, well-nourished, and in no distress. No distress.  HENT:  Head: Normocephalic  and atraumatic.  Eyes: Conjunctivae are normal.  Cardiovascular: Normal rate and regular rhythm.   Murmur (2/6 Aortic area.) heard. Pulmonary/Chest: Effort normal and breath sounds normal. No respiratory distress. She has no wheezes. She has no rales.  Abdominal: Soft. Bowel sounds are normal. She exhibits no distension. There is no tenderness. There is no rebound.  Musculoskeletal: She exhibits edema (2+ edema of LLE to mid lower leg.  1+ edema of RLE to ankle.). She exhibits no tenderness.  Neurological: She is alert and oriented to person, place, and time.  Skin: Skin is warm and dry. She is not diaphoretic.    Assessment & Plan:  See Problem based Assessment and Plan.

## 2012-12-26 NOTE — Assessment & Plan Note (Addendum)
Patient currently feeling much better, No spitting up blood.  Does report melena but is currently taking Iron supplements.  Patient currently taking Protonix per GI recs.  Will recheck CBC today. Patient has follow up with Dr. Arlyce Dice, GI on Sept 23rd.

## 2012-12-26 NOTE — Assessment & Plan Note (Signed)
Patient had completed 11 weeks of Warfarin A/C, no evidence of DVT on repeat U/S in hospital, Warfarin was discontinued by team in setting of Acute blood loss anemia and patient was near to completion of DVT course of therapy. Patient currently has LLE edema greater than RLE edema and has tenderness of calf.  Patient was examined with Dr. Meredith Pel who saw patient in hospital and reports tenderness is unchanged from discharge.  Patient agrees with this assessment and her only complaint is increased B/L edema.  Patient was offered a repeat Dopper exam as this would help ensure no DVT has developed but patient declined due to time.  Patient reports she will follow up in 1 week, and call sooner if she has increased pain or swelling or SOB.

## 2012-12-27 MED ORDER — POTASSIUM CHLORIDE ER 10 MEQ PO TBCR: 20.0000 meq | EXTENDED_RELEASE_TABLET | Freq: Two times a day (BID) | ORAL | Status: DC

## 2012-12-27 NOTE — Addendum Note (Signed)
Addended by: Gust Rung on: 12/27/2012 04:58 PM   Modules accepted: Orders

## 2013-01-01 ENCOUNTER — Encounter: Payer: Self-pay | Admitting: Internal Medicine

## 2013-01-01 ENCOUNTER — Ambulatory Visit (INDEPENDENT_AMBULATORY_CARE_PROVIDER_SITE_OTHER): Payer: Medicare Other | Admitting: Internal Medicine

## 2013-01-01 VITALS — BP 128/79 | HR 62 | Temp 98.7°F | Ht 60.0 in | Wt 133.2 lb

## 2013-01-01 DIAGNOSIS — F411 Generalized anxiety disorder: Secondary | ICD-10-CM

## 2013-01-01 DIAGNOSIS — I10 Essential (primary) hypertension: Secondary | ICD-10-CM

## 2013-01-01 DIAGNOSIS — F419 Anxiety disorder, unspecified: Secondary | ICD-10-CM

## 2013-01-01 LAB — BASIC METABOLIC PANEL WITH GFR
CO2: 30 mEq/L (ref 19–32)
Calcium: 9.4 mg/dL (ref 8.4–10.5)
Chloride: 105 mEq/L (ref 96–112)
Glucose, Bld: 84 mg/dL (ref 70–99)
Sodium: 141 mEq/L (ref 135–145)

## 2013-01-01 MED ORDER — CITALOPRAM HYDROBROMIDE 40 MG PO TABS: 40.0000 mg | ORAL_TABLET | Freq: Every day | ORAL | Status: DC

## 2013-01-01 NOTE — Progress Notes (Signed)
Subjective:   Patient ID: Sherri Mcmillan female   DOB: [birth year] 79 y.o.   MRN: [id number]  HPI: Sherri Mcmillan is a 79 y.o. female who presents today for one-week followup. She reports that the swelling in her legs has decreased the Lasix. She also notes that she was taking the potassium supplement as prescribed. In addition she reports that her right calf is still tender but the pain center slightly better control. She continues to do any shortness of breath. Her main concern today is that her anxiety is still not well controlled. She reports that she has been taking Klonopin twice a day and still feels very anxious. Upon further questioning she does report that she has not taken her Celexa in about a month and half which she reports is due to her pharmacy telling her that she needed prior authorization. Upon review of the chart I don't see any record request for prior authorization. However it is unclear if she has any medication refills. She otherwise reports no side effects from current medications.    Past Medical History  Diagnosis Date  . Chronic pain     DJD knees, back, migranes, LLE varicose veins, and LLE neuropathy.   . Aortic stenosis     Dx ECHO 2008 , mild and aymptomatic , needs ECHO q 3-5 yrs  . Barrett's esophagus     Demonstrated on EGD 12/2010. EGD 09/17/2012 shows inflamed GE junctional mucosa without metaplasia, dysplasia, or malignancy.  . Chronic insomnia   . Chronic anxiety     Admission to [facility] (90s or early 2000)  for 6 weeks after mother died. Complicated again by death of her sister 2010. 2012 developed hallucinations and I refused to refill controlled meds unless she see psych which she is not agreable to  . Elevated cholesterol 10/11    LDL 155. Her 10 year risk (decreasing her age to 53 as the calculator won't go to age 76) is 21% ish. If consider her non smoker (which I wouldn't even though she says 1 pak lasts 2 weeks) risk is 12% ish. So goal for LDL is 130  or 100.  Marland Kitchen HTN (hypertension)     Controlled with 2 drug therapy  . Migraine   . Cataract   . Depression   . Gastroparesis     Demonstrated on GES 12/2010 by Dr Dalene Seltzer  . Bronchitis     hx of  . Shortness of breath   . Left leg DVT 09/28/2012  . Anemia 12/14/2012   Current Outpatient Prescriptions  Medication Sig Dispense Refill  . acetaminophen-codeine (TYLENOL #3) 300-30 MG per tablet Take 1 tablet by mouth every 4 (four) hours as needed for pain.  45 tablet  0  . citalopram (CELEXA) 40 MG tablet Take 40 mg by mouth daily.      . clonazePAM (KLONOPIN) 0.25 MG disintegrating tablet Take 1 tablet (0.25 mg total) by mouth 2 (two) times daily as needed.  20 tablet  0  . docusate sodium (COLACE) 100 MG capsule Take 1 capsule (100 mg total) by mouth 2 (two) times daily as needed for constipation.  60 capsule  2  . ferrous sulfate 325 (65 FE) MG tablet Take 1 tablet (325 mg total) by mouth daily with breakfast.  90 tablet  2  . furosemide (LASIX) 40 MG tablet Take 1 tablet (40 mg total) by mouth daily.  30 tablet  2  . HYDROcodone-acetaminophen (NORCO/VICODIN) 5-325 MG per tablet Take 1 tablet by  mouth every 4 (four) hours as needed for pain.  20 tablet  0  . pantoprazole (PROTONIX) 40 MG tablet Take 1 tablet (40 mg total) by mouth daily.  90 tablet  3  . potassium chloride (K-DUR) 10 MEQ tablet Take 2 tablets (20 mEq total) by mouth 2 (two) times daily.  14 tablet  0  . promethazine (PHENERGAN) 12.5 MG tablet Take 1 tablet (12.5 mg total) by mouth every 6 (six) hours as needed for nausea.  30 tablet  1  . sucralfate (CARAFATE) 1 G tablet Take 1 tablet (1 g total) by mouth 4 (four) times daily.  120 tablet  4   No current facility-administered medications for this visit.   Family History  Problem Relation Age of Onset  . Stroke Neg Hx   . Cancer Neg Hx   . Colon cancer Neg Hx   . Anesthesia problems Neg Hx   . Hypotension Neg Hx   . Malignant hyperthermia Neg Hx   . Pseudochol  deficiency Neg Hx   . Heart disease Mother   . Hypertension Mother    History   Social History  . Marital Status: Single    Spouse Name: N/A    Number of Children: N/A  . Years of Education: N/A   Social History Main Topics  . Smoking status: Former Smoker    Types: Cigarettes    Quit date: 06/01/2010  . Smokeless tobacco: Never Used  . Alcohol Use: No  . Drug Use: No  . Sexual Activity: None   Other Topics Concern  . 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  HENT: Negative for congestion.   Eyes: Negative for blurred vision.  Respiratory: Negative for cough, sputum production and wheezing.   Cardiovascular: Positive for leg swelling (decreased). Negative for chest pain and palpitations.  Gastrointestinal: Negative for heartburn, vomiting, abdominal pain and diarrhea.  Genitourinary: Negative for dysuria.  Musculoskeletal: Negative for falls.  Neurological: Negative for dizziness, loss of consciousness, weakness and headaches.  Psychiatric/Behavioral: Negative for depression. The patient is nervous/anxious.     Objective:  Physical Exam: Filed Vitals:   01/01/13 1021  BP: 128/79  Pulse: 62  Temp: 98.7 F (37.1 C)  TempSrc: Oral  Height: 5' (1.524 m)  Weight: 133 lb 3.2 oz (60.419 kg)  SpO2: 100%  Physical Exam  Nursing note and vitals reviewed. Constitutional: She is oriented to person, place, and time and well-developed, well-nourished, and in no distress. No distress.  HENT:  Head: Normocephalic and atraumatic.  Eyes: Conjunctivae are normal.   Cardiovascular: Normal rate and regular rhythm.   Murmur (2/6 Aortic area.) heard. Pulmonary/Chest: Effort normal and breath sounds normal. No respiratory distress. She has no wheezes. She has no rales.  Abdominal: Soft. Bowel sounds are normal. She exhibits no distension. There is no tenderness. There is no rebound.  Musculoskeletal: She exhibits edema (1+ edema to left ankle).  Left calf TTP, unchanged from previous exam.  Neurological: She is alert and oriented to person,  place, and time.  Skin: Skin is warm and dry. She is not diaphoretic.     Assessment & Plan:   See Problem Based Assessment and Plan

## 2013-01-01 NOTE — Assessment & Plan Note (Addendum)
Patient reports Clonazepam has not helped much to decrease anxiety.  She also reports today that she has not had Celexa in about a month and a half.  She reports that the pharmacy reported she needed a prior authorization however she has taken this medication for years.  In our medication panel this has been changed to a medication by a historical provider and may be related to lack of refills.   Will refill medication today, patient instructed to have pharmacy fax prior authorization if needed.

## 2013-01-01 NOTE — Patient Instructions (Addendum)
1.  Please go to the pharmacy and try to get Celexa medication. If they tell you they need a prior authorization please have them fax form to our office. 2.  Continue to take Klonopin as needed until you can restart Celexa.

## 2013-01-01 NOTE — Assessment & Plan Note (Addendum)
BP Readings from Last 3 Encounters:  01/01/13 128/79  12/26/12 121/69  12/19/12 114/70    Lab Results  Component Value Date   NA 144 12/26/2012   K 3.5 12/26/2012   CREATININE 1.00 12/26/2012    Assessment: Blood pressure control: controlled Progress toward BP goal:  at goal   Plan: Medications: Lasix 40mg  daily,  Educational resources provided:   Self management tools provided:   Other plans: Recheck BMet today for potassium level.  Lower extremity edema is improved.

## 2013-01-02 NOTE — Progress Notes (Signed)
I saw and evaluated the patient.  I personally confirmed the key portions of the history and exam documented by Dr. Hoffman and I reviewed pertinent patient test results.  The assessment, diagnosis, and plan were formulated together and I agree with the documentation in the resident's note. 

## 2013-01-10 ENCOUNTER — Other Ambulatory Visit: Payer: Self-pay | Admitting: Internal Medicine

## 2013-01-11 ENCOUNTER — Other Ambulatory Visit: Payer: Self-pay | Admitting: *Deleted

## 2013-01-11 DIAGNOSIS — F419 Anxiety disorder, unspecified: Secondary | ICD-10-CM

## 2013-01-11 DIAGNOSIS — I10 Essential (primary) hypertension: Secondary | ICD-10-CM

## 2013-01-11 NOTE — Telephone Encounter (Signed)
Pt will come Monday 9/25 for lab. Also explain to pt use Celexa instead of Klonopin; voiced understanding.

## 2013-01-12 ENCOUNTER — Other Ambulatory Visit: Payer: Self-pay | Admitting: Internal Medicine

## 2013-01-15 ENCOUNTER — Other Ambulatory Visit (INDEPENDENT_AMBULATORY_CARE_PROVIDER_SITE_OTHER): Payer: Medicare Other

## 2013-01-15 ENCOUNTER — Other Ambulatory Visit: Payer: Self-pay | Admitting: Internal Medicine

## 2013-01-15 DIAGNOSIS — I10 Essential (primary) hypertension: Secondary | ICD-10-CM

## 2013-01-15 LAB — BASIC METABOLIC PANEL WITH GFR
BUN: 19 mg/dL (ref 6–23)
CO2: 28 mEq/L (ref 19–32)
Chloride: 101 mEq/L (ref 96–112)
GFR, Est African American: 58 mL/min — ABNORMAL LOW
Glucose, Bld: 91 mg/dL (ref 70–99)
Potassium: 3.3 mEq/L — ABNORMAL LOW (ref 3.5–5.3)
Sodium: 140 mEq/L (ref 135–145)

## 2013-01-16 ENCOUNTER — Ambulatory Visit (INDEPENDENT_AMBULATORY_CARE_PROVIDER_SITE_OTHER): Payer: Medicare Other | Admitting: Gastroenterology

## 2013-01-16 ENCOUNTER — Encounter: Payer: Self-pay | Admitting: Gastroenterology

## 2013-01-16 VITALS — BP 100/60 | HR 78 | Ht 60.0 in | Wt 133.5 lb

## 2013-01-16 DIAGNOSIS — K3184 Gastroparesis: Secondary | ICD-10-CM

## 2013-01-16 DIAGNOSIS — D62 Acute posthemorrhagic anemia: Secondary | ICD-10-CM

## 2013-01-16 DIAGNOSIS — K222 Esophageal obstruction: Secondary | ICD-10-CM

## 2013-01-16 DIAGNOSIS — K2100 Gastro-esophageal reflux disease with esophagitis, without bleeding: Secondary | ICD-10-CM

## 2013-01-16 MED ORDER — PANTOPRAZOLE SODIUM 40 MG PO TBEC: 40.0000 mg | DELAYED_RELEASE_TABLET | Freq: Two times a day (BID) | ORAL | Status: DC

## 2013-01-16 NOTE — Patient Instructions (Addendum)

## 2013-01-16 NOTE — Assessment & Plan Note (Signed)
Patient continues to have break-through symptoms.  Plan to increase protonic to twice a day.  Should symptoms worsen would consider adding a promotility medication.

## 2013-01-16 NOTE — Assessment & Plan Note (Addendum)
Patient's has persistent complains of dysphagia is probably 2 to a Schatzki's ring.  Recommendations #1 upper endoscopy with [facility] dilation  Risks, alternatives, and complications of the procedure, including bleeding, perforation, and possible need for surgery, were explained to the patient.  Patient's questions were answered.

## 2013-01-16 NOTE — Assessment & Plan Note (Signed)
Etiology not determined.  No further overt GI bleeding.

## 2013-01-16 NOTE — Assessment & Plan Note (Signed)
Asymptomatic except to the extent that it is contributing to her reflux symptoms.  Will hold specifically treating gastroparesis at this time.

## 2013-01-16 NOTE — Progress Notes (Signed)
History of Present Illness:  [name] has returned following hospitalization for an acute GI bleed.  Upper endoscopy demonstrated a Schatzki's ring and retained gastric contents.  There was no fresh or old blood.  Coumadin was discontinued.  She's had no further bleeding.  She continues to complain of dysphagia to solids.  She has occasional pyrosis for which she is taking Protonix.  She has a history of Barrett's esophagus but none was seen at her last endoscopy.  She denies nausea.    Review of Systems: Pertinent positive and negative review of systems were noted in the above HPI section. All other review of systems were otherwise negative.    Current Medications, Allergies, Past Medical History, Past Surgical History, Family History and Social History were reviewed in [facility] electronic medical record  Vital signs were reviewed in today's medical record. Physical Exam: General: Well developed , well nourished, no acute distress

## 2013-01-18 ENCOUNTER — Encounter: Payer: Self-pay | Admitting: Gastroenterology

## 2013-01-18 ENCOUNTER — Other Ambulatory Visit: Payer: Self-pay | Admitting: Internal Medicine

## 2013-01-18 DIAGNOSIS — E876 Hypokalemia: Secondary | ICD-10-CM | POA: Insufficient documentation

## 2013-01-18 MED ORDER — POTASSIUM CHLORIDE ER 10 MEQ PO TBCR: 40.0000 meq | EXTENDED_RELEASE_TABLET | Freq: Once | ORAL | Status: DC

## 2013-01-18 NOTE — Progress Notes (Signed)
Pt was called about new K-Dur rx per Dr Dierdre Searles; no answer, message left.

## 2013-01-26 ENCOUNTER — Telehealth: Payer: Self-pay | Admitting: Pharmacist

## 2013-02-01 ENCOUNTER — Ambulatory Visit (INDEPENDENT_AMBULATORY_CARE_PROVIDER_SITE_OTHER): Payer: Medicare Other | Admitting: Internal Medicine

## 2013-02-01 ENCOUNTER — Encounter: Payer: Self-pay | Admitting: Internal Medicine

## 2013-02-01 VITALS — BP 132/73 | HR 70 | Temp 97.0°F | Ht 60.0 in | Wt 134.5 lb

## 2013-02-01 DIAGNOSIS — E876 Hypokalemia: Secondary | ICD-10-CM

## 2013-02-01 DIAGNOSIS — I10 Essential (primary) hypertension: Secondary | ICD-10-CM

## 2013-02-01 DIAGNOSIS — E7800 Pure hypercholesterolemia, unspecified: Secondary | ICD-10-CM

## 2013-02-01 DIAGNOSIS — I872 Venous insufficiency (chronic) (peripheral): Secondary | ICD-10-CM

## 2013-02-01 DIAGNOSIS — G8929 Other chronic pain: Secondary | ICD-10-CM

## 2013-02-01 DIAGNOSIS — K222 Esophageal obstruction: Secondary | ICD-10-CM

## 2013-02-01 MED ORDER — HYDROCODONE-ACETAMINOPHEN 5-325 MG PO TABS: 1.0000 | ORAL_TABLET | Freq: Three times a day (TID) | ORAL | Status: DC | PRN

## 2013-02-01 NOTE — Patient Instructions (Addendum)
1. Will give you Hydrocodone 5/325 1 tab daily as needed  2. Follow up in Dec.

## 2013-02-01 NOTE — Progress Notes (Signed)
Subjective:   Patient ID: Sherri Mcmillan female   DOB: July 24, 1932 79 y.o.   MRN: [id number]  HPI: Sherri Mcmillan is a 79 y.o. woman with PMH of hypertension, GERD, Barrett's esophagus, gastroparesis, migraine, chronic anxiety, chronic insomnia, depression, and chronic pain, who presents to the clinic for  followup.  # Stricture and stenosis of Esophagus     Chronic dysphagia followed by [facility] Dr. Arlyce Dice. Patient was noted to have Schatzki's ring and upper endoscopy with [facility] dilation is scheduled soon.   # HTN   Well controled. She reports medical compliance.   # Chronic pain    Patient reports chronic pain including DJD knee, back, LLE varicose veins and LLE neuropathy. She requests refill of hydrocodone.   Health maintenance 1. Zostavax--discuss it next visit 2. Lipid panel- defer to next visit   Past Medical History  Diagnosis Date  . Chronic pain     DJD knees, back, migranes, LLE varicose veins, and LLE neuropathy.   . Aortic stenosis     Dx ECHO 2008 , mild and aymptomatic , needs ECHO q 3-5 yrs  . Barrett's esophagus     Demonstrated on EGD 12/2010. EGD 09/17/2012 shows inflamed GE junctional mucosa without metaplasia, dysplasia, or malignancy.  . Chronic insomnia   . Chronic anxiety     Admission to [facility] (90s or early 2000)  for 6 weeks after mother died. Complicated again by death of her sister 2010. 2012 developed hallucinations and I refused to refill controlled meds unless she see psych which she is not agreable to  . Elevated cholesterol 10/11    LDL 155. Her 10 year risk (decreasing her age to 67 as the calculator won't go to age 36) is 21% ish. If consider her non smoker (which I wouldn't even though she says 1 pak lasts 2 weeks) risk is 12% ish. So goal for LDL is 130 or 100.  Marland Kitchen HTN (hypertension)     Controlled with 2 drug therapy  . Migraine   . Cataract   . Depression   . Gastroparesis     Demonstrated on GES 12/2010 by Dr Dalene Seltzer  . Bronchitis     hx of   . Shortness of breath   . Left leg DVT 09/28/2012  . Anemia 12/14/2012   Current Outpatient Prescriptions  Medication Sig Dispense Refill  . acetaminophen-codeine (TYLENOL #3) 300-30 MG per tablet Take 1 tablet by mouth every 4 (four) hours as needed for pain.  45 tablet  0  . antiseptic oral rinse (BIOTENE) LIQD 15 mLs by Mouth Rinse route as needed.      . citalopram (CELEXA) 40 MG tablet Take 1 tablet (40 mg total) by mouth daily.  30 tablet  3  . docusate sodium (COLACE) 100 MG capsule Take 1 capsule (100 mg total) by mouth 2 (two) times daily as needed for constipation.  60 capsule  2  . ferrous sulfate 325 (65 FE) MG tablet Take 1 tablet (325 mg total) by mouth daily with breakfast.  90 tablet  2  . furosemide (LASIX) 40 MG tablet Take 1 tablet (40 mg total) by mouth daily.  30 tablet  2  . HYDROcodone-acetaminophen (NORCO/VICODIN) 5-325 MG per tablet Take 1 tablet by mouth every 4 (four) hours as needed for pain.  20 tablet  0  . pantoprazole (PROTONIX) 40 MG tablet Take 1 tablet (40 mg total) by mouth 2 (two) times daily.  60 tablet  3  . promethazine (PHENERGAN) 12.5  MG tablet Take 1 tablet (12.5 mg total) by mouth every 6 (six) hours as needed for nausea.  30 tablet  1  . sucralfate (CARAFATE) 1 G tablet Take 1 tablet (1 g total) by mouth 4 (four) times daily.  120 tablet  4   No current facility-administered medications for this visit.   Family History  Problem Relation Age of Onset  . Stroke Neg Hx   . Cancer Neg Hx   . Colon cancer Neg Hx   . Anesthesia problems Neg Hx   . Hypotension Neg Hx   . Malignant hyperthermia Neg Hx   . Pseudochol deficiency Neg Hx   . Heart disease Mother   . Hypertension Mother    History   Social History  . Marital Status: Single    Spouse Name: N/A    Number of Children: N/A  . Years of Education: N/A   Social History Main Topics  . Smoking status: Former Smoker    Types: Cigarettes    Quit date: 06/01/2010  . Smokeless tobacco:  Never Used  . Alcohol Use: No  . Drug Use: No  . Sexual Activity: None   Other Topics Concern  . 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   HEENT:  Denies congestion, sore throat, rhinorrhea, sneezing, mouth sores,neck pain.persistant trouble swallowing.  Respiratory:  Denies SOB, DOE, cough, and wheezing.   Cardiovascular:  Denies palpitations and positive for mild LLE leg swelling.   Gastrointestinal:  Denies nausea, vomiting, abdominal pain, diarrhea, constipation, blood in stool and abdominal distention.    Genitourinary:  Denies dysuria, urgency, frequency, hematuria, flank pain and difficulty urinating.   Musculoskeletal:  Denies gait problem. Chronic back, knees and LLE pain.  Skin:  Denies pallor, rash and wound.   Neurological:  Denies dizziness, seizures, syncope, weakness, light-headedness, numbness and headaches.    .    Objective:  Physical Exam: Filed Vitals:   02/01/13 1431  BP: 132/73  Pulse: 70  Temp: 97 F (36.1 C)  TempSrc: Oral  Height: 5' (1.524 m)  Weight: 134 lb 8 oz (61.009 kg)  SpO2: 100%   General: NAD Neck: supple, full ROM, no thyromegaly, no JVD.  Lungs: CTA B/L Heart: RRR, No M/G/R Abdomen: soft, non-tender, normal bowel sounds, no distention, no guarding, no rebound tenderness. Msk: no joint warmth, and no redness over joints.   Pulses: 2+ DP/PT pulses bilaterally Extremities: No cyanosis, clubbing. Chronic mild LLE  edema Neurologic: alert & oriented X3.     Assessment & Plan:

## 2013-02-02 LAB — BASIC METABOLIC PANEL WITH GFR
BUN: 15 mg/dL (ref 6–23)
CO2: 34 mEq/L — ABNORMAL HIGH (ref 19–32)
Chloride: 101 mEq/L (ref 96–112)
Creat: 0.92 mg/dL (ref 0.50–1.10)
Glucose, Bld: 69 mg/dL — ABNORMAL LOW (ref 70–99)
Sodium: 143 mEq/L (ref 135–145)

## 2013-02-03 ENCOUNTER — Telehealth: Payer: Self-pay | Admitting: Internal Medicine

## 2013-02-03 DIAGNOSIS — E876 Hypokalemia: Secondary | ICD-10-CM | POA: Insufficient documentation

## 2013-02-03 MED ORDER — POTASSIUM CHLORIDE ER 10 MEQ PO TBCR: 10.0000 meq | EXTENDED_RELEASE_TABLET | Freq: Every day | ORAL | Status: DC

## 2013-02-03 NOTE — Assessment & Plan Note (Signed)
k 3.1.   - Will give her a rx for daily K-dur supplement. - K-Dur 10 Meg po daily - BMP in 2 weeks

## 2013-02-03 NOTE — Assessment & Plan Note (Signed)
Stable. Patient's BPs are well controlled with Lasix 40 mg po daily. Her Norvasc was D/C during the hospitalization in August 2014. We will not plan to restart the Norvasc given her well controlled BPS and chronic LE edema.  - continue the current therapy - BMP today - May consider to start the potassium 10 Meg 1 tab daily if her K remains low.

## 2013-02-03 NOTE — Assessment & Plan Note (Signed)
Patient had had symptomatic dysphagia which was followed by [facility] and scheduled to have upper endoscopy with [facility] dilation soon.   - continue to follow up with [facility].

## 2013-02-03 NOTE — Assessment & Plan Note (Addendum)
She is due for lipid panel  - will check her lipid panel  Addendum: Lipid Panel     Component Value Date/Time   CHOL 251* 02/19/2013 0941   TRIG 79 02/19/2013 0941   HDL 96 02/19/2013 0941   CHOLHDL 2.6 02/19/2013 0941   VLDL 16 02/19/2013 0941   LDLCALC 139* 02/19/2013 0941   Her goal is < 130.   Plan: - Will try the TLC diet given her LDL is mildly over the goal and the risk of statin could outweigh the benefit for elderly patient.  - will call patient and discuss the following instructions.   1. TLC diet     - Saturated fat < 7% of calories, cholesterol < 200 mg/day     - consider increased viscous ( soluble) fiber ( 10-25 g /day) and plant stanols /sterols (2 g/day) as therapeutic options to enhance LDL lowering  2. Weight management  3. Increased physical activity.

## 2013-02-03 NOTE — Assessment & Plan Note (Signed)
Patient has had chronic pain in multiple joints and she requests refill of hydrocodone.  In the past, she requested Benzodiazepine to control her chronic anxiety and was not complaint with Celexa.  And she also declined to follow up with her psychiatrist, and her pain medication refills was declined due to medical noncompliance with psychiatrist.   She now agrees to be more compliant with Celexa and stop Benzo. She requests to only have hydrocodone 5/325 1 tab daily as needed. I will give her one month refill for now and discuss the long term treatment and pain contract next OV if she remains compliant .  - Hydrocodone 5/325 1 tab daily PRN. # 40. No refills.

## 2013-02-03 NOTE — Telephone Encounter (Signed)
  INTERNAL MEDICINE RESIDENCY PROGRAM After-Hours Telephone Call    Reason for call:   I placed an outgoing call to Ms. Shantai Inglett at 6 pm regarding hypokalemia. I left a message on her phone.   Her K was 3.1 and K-Dur 10 Meg po daily was sent for her to pick up. She should come back to the clinic for BMP and lipid panel in 2 weeks.  I will ask the Triage nurse to call her on Monday again. In basket massage sent to triage nurse.    Dede Query, MD   02/03/2013, 6:28 PM

## 2013-02-05 NOTE — Telephone Encounter (Signed)
Spoke w/ pt, she veb understanding and appt for labs made 10/27

## 2013-02-05 NOTE — Telephone Encounter (Signed)
Thanks  Dr.  

## 2013-02-06 ENCOUNTER — Ambulatory Visit (AMBULATORY_SURGERY_CENTER): Payer: Medicare Other | Admitting: Gastroenterology

## 2013-02-06 ENCOUNTER — Encounter: Payer: Self-pay | Admitting: Gastroenterology

## 2013-02-06 VITALS — BP 150/87 | HR 73 | Temp 98.1°F | Resp 14 | Ht 60.0 in | Wt 133.0 lb

## 2013-02-06 DIAGNOSIS — R1310 Dysphagia, unspecified: Secondary | ICD-10-CM

## 2013-02-06 MED ORDER — SODIUM CHLORIDE 0.9 % IV SOLN: 500.0000 mL | INTRAVENOUS | Status: DC

## 2013-02-06 NOTE — Progress Notes (Signed)
Called to room to assist during endoscopic procedure.  Patient ID and intended procedure confirmed with present staff. Received instructions for my participation in the procedure from the performing physician.  

## 2013-02-06 NOTE — Op Note (Signed)
[facility] [address] [place], [address]   ENDOSCOPY PROCEDURE REPORT  PATIENT: Sherri Mcmillan, Sherri Mcmillan  MR#: [id number] BIRTHDATE: [birth year] , 80  yrs. old GENDER: Female ENDOSCOPIST: Louis Meckel, MD REFERRED BY: PROCEDURE DATE:  02/06/2013 PROCEDURE:  EGD, diagnostic and Maloney dilation of esophagus ASA CLASS:     Class II INDICATIONS:  Dysphagia. MEDICATIONS: MAC sedation, administered by CRNA, propofol (Diprivan) 100mg  IV, and Simethicone 0.6cc PO TOPICAL ANESTHETIC: Cetacaine Spray  DESCRIPTION OF PROCEDURE: After the risks benefits and alternatives of the procedure were thoroughly explained, informed consent was obtained.  The [facility] [id number] endoscope was introduced through the mouth and advanced to the third portion of the duodenum. Without limitations.  The instrument was slowly withdrawn as the mucosa was fully examined.      There was a moderate stricture at the GE junction.  Centimeters also was erythematous.  There are multiple erosions. A 7 cm sliding hiatal hernia was present.   The remainder of the upper endoscopy exam was otherwise normal.  Retroflexed views revealed no abnormalities.     The scope was then withdrawn from the patient.  a #52 [place] dilator was passed with moderate resistance.  There was a minimal amount of heme.  COMPLICATIONS: There were no complications. ENDOSCOPIC IMPRESSION: 1.   esophageal stricture-status post Maloney dilation 2.  erosive esophagitis 3.  large hiatal hernia  RECOMMENDATIONS: 1.  continue current medications-Protonix and Carafate 2.  resume metoclopramide 3.  office visit one month REPEAT EXAM:  eSigned:  Louis Meckel, MD 02/06/2013 3:39 PM   CC:Li, Na MD

## 2013-02-06 NOTE — Progress Notes (Signed)
Patient did not experience any of the following events: a burn prior to discharge; a fall within the facility; wrong site/side/patient/procedure/implant event; or a hospital transfer or hospital admission upon discharge from the facility. (G8907) Patient did not have preoperative order for IV antibiotic SSI prophylaxis. (G8918)  

## 2013-02-06 NOTE — Patient Instructions (Addendum)
Discharge instructions given with verbal understanding. Handouts on stricture,asophagitis, and a dilatation diet. Resume previous medications. YOU HAD AN ENDOSCOPIC PROCEDURE TODAY AT THE [facility]: Refer to the procedure report that was given to you for any specific questions about what was found during the examination.  If the procedure report does not answer your questions, please call your gastroenterologist to clarify.  If you requested that your care partner not be given the details of your procedure findings, then the procedure report has been included in a sealed envelope for you to review at your convenience later.  YOU SHOULD EXPECT: Some feelings of bloating in the abdomen. Passage of more gas than usual.  Walking can help get rid of the air that was put into your GI tract during the procedure and reduce the bloating. If you had a lower endoscopy (such as a colonoscopy or flexible sigmoidoscopy) you may notice spotting of blood in your stool or on the toilet paper. If you underwent a bowel prep for your procedure, then you may not have a normal bowel movement for a few days.  DIET: Your first meal following the procedure should be a light meal and then it is ok to progress to your normal diet.  A half-sandwich or bowl of soup is an example of a good first meal.  Heavy or fried foods are harder to digest and may make you feel nauseous or bloated.  Likewise meals heavy in dairy and vegetables can cause extra gas to form and this can also increase the bloating.  Drink plenty of fluids but you should avoid alcoholic beverages for 24 hours.  ACTIVITY: Your care partner should take you home directly after the procedure.  You should plan to take it easy, moving slowly for the rest of the day.  You can resume normal activity the day after the procedure however you should NOT DRIVE or use heavy machinery for 24 hours (because of the sedation medicines used during the test).    SYMPTOMS TO  REPORT IMMEDIATELY: A gastroenterologist can be reached at any hour.  During normal business hours, 8:30 AM to 5:00 PM Monday through Friday, call [phone].  After hours and on weekends, please call the GI answering service at [phone] who will take a message and have the physician on call contact you.   Following upper endoscopy (EGD)  Vomiting of blood or coffee ground material  New chest pain or pain under the shoulder blades  Painful or persistently difficult swallowing  New shortness of breath  Fever of 100F or higher  Black, tarry-looking stools  FOLLOW UP: If any biopsies were taken you will be contacted by phone or by letter within the next 1-3 weeks.  Call your gastroenterologist if you have not heard about the biopsies in 3 weeks.  Our staff will call the home number listed on your records the next business day following your procedure to check on you and address any questions or concerns that you may have at that time regarding the information given to you following your procedure. This is a courtesy call and so if there is no answer at the home number and we have not heard from you through the emergency physician on call, we will assume that you have returned to your regular daily activities without incident.  SIGNATURES/CONFIDENTIALITY: You and/or your care partner have signed paperwork which will be entered into your electronic medical record.  These signatures attest to the fact that that the  information above on your After Visit Summary has been reviewed and is understood.  Full responsibility of the confidentiality of this discharge information lies with you and/or your care-partner.  Contact office immediately  if you develops any side effects from her Reglan including paresthesias, tremors, confusion , weakness or muscle spasms.

## 2013-02-06 NOTE — Progress Notes (Signed)
PT. Stated that she started spitting up blood on Sunday.

## 2013-02-07 ENCOUNTER — Telehealth: Payer: Self-pay | Admitting: *Deleted

## 2013-02-07 MED ORDER — SUCRALFATE 1 G PO TABS: 1.0000 g | ORAL_TABLET | Freq: Four times a day (QID) | ORAL | Status: DC

## 2013-02-07 MED ORDER — PANTOPRAZOLE SODIUM 40 MG PO TBEC: 40.0000 mg | DELAYED_RELEASE_TABLET | Freq: Two times a day (BID) | ORAL | Status: DC

## 2013-02-07 MED ORDER — METOCLOPRAMIDE HCL 5 MG PO TABS: 5.0000 mg | ORAL_TABLET | Freq: Three times a day (TID) | ORAL | Status: DC

## 2013-02-07 NOTE — Telephone Encounter (Signed)
  Follow up Call-  Call back number 02/06/2013 11/23/2010 09/23/2010  Post procedure Call Back phone  # [phone]  Permission to leave phone message Yes - -     Patient questions:  Do you have a fever, pain , or abdominal swelling? no Pain Score  9 *  Have you tolerated food without any problems? yes  Have you been able to return to your normal activities? no  Do you have any questions about your discharge instructions: Diet   no Medications  yes Follow up visit  no  Do you have questions or concerns about your Care? yes  Actions: * If pain score is 4 or above: Physician/ provider Notified : Melvia Heaps, MD.  Message routed to [facility].  She noted that she spoke with pt.  Pt. Doing better.  Meds sent to pharmacy.

## 2013-02-07 NOTE — Telephone Encounter (Signed)
Please address this with the doc of the day, Dr. Arlyce Dice is on Jury duty today.

## 2013-02-07 NOTE — Telephone Encounter (Signed)
Spoke with pt and she is concerned because she does not have the medication that Dr. Arlyce Dice told her to start. All scripts sent to pharmacy for the pt. She states she is doing ok at this time and wished [place] a blessed weekend.

## 2013-02-08 NOTE — Progress Notes (Signed)
Case discussed with Dr. Li at the time of the visit.  We reviewed the resident's history and exam and pertinent patient test results.  I agree with the assessment, diagnosis, and plan of care documented in the resident's note. 

## 2013-02-08 NOTE — Telephone Encounter (Signed)
ok 

## 2013-02-19 ENCOUNTER — Other Ambulatory Visit: Payer: Medicare Other

## 2013-02-19 ENCOUNTER — Encounter: Payer: Self-pay | Admitting: Nurse Practitioner

## 2013-02-19 ENCOUNTER — Ambulatory Visit (INDEPENDENT_AMBULATORY_CARE_PROVIDER_SITE_OTHER): Payer: Medicare Other | Admitting: Nurse Practitioner

## 2013-02-19 VITALS — BP 128/80 | HR 79 | Ht 60.0 in | Wt 134.2 lb

## 2013-02-19 DIAGNOSIS — K219 Gastro-esophageal reflux disease without esophagitis: Secondary | ICD-10-CM

## 2013-02-19 DIAGNOSIS — E7800 Pure hypercholesterolemia, unspecified: Secondary | ICD-10-CM

## 2013-02-19 DIAGNOSIS — K449 Diaphragmatic hernia without obstruction or gangrene: Secondary | ICD-10-CM | POA: Insufficient documentation

## 2013-02-19 DIAGNOSIS — K2100 Gastro-esophageal reflux disease with esophagitis, without bleeding: Secondary | ICD-10-CM

## 2013-02-19 LAB — LIPID PANEL
HDL: 96 mg/dL (ref >39)
Total CHOL/HDL Ratio: 2.6 Ratio
VLDL: 16 mg/dL (ref 0–40)

## 2013-02-19 NOTE — Patient Instructions (Signed)
We scheduled the Upper Gi Series test at [facility] for tomorrow 02-20-2013.  Go to registration , inside front door of [employer]. Have nothing to eat or drink after midnight.   We made you an appointment with Dr. Luretha Murphy at [facility] for 03-13-2013 . Arrive at 2:40 PM.  They are at [address], [address] [place]. [phone] is their number.

## 2013-02-20 ENCOUNTER — Ambulatory Visit (HOSPITAL_COMMUNITY)
Admission: RE | Admit: 2013-02-20 | Discharge: 2013-02-20 | Disposition: A | Discharge status: Home / Self Care | Payer: Medicare Other | Source: Ambulatory Visit | Attending: Nurse Practitioner | Admitting: Nurse Practitioner

## 2013-02-20 ENCOUNTER — Encounter: Payer: Self-pay | Admitting: Nurse Practitioner

## 2013-02-20 ENCOUNTER — Encounter: Payer: Self-pay | Admitting: Internal Medicine

## 2013-02-20 DIAGNOSIS — K219 Gastro-esophageal reflux disease without esophagitis: Secondary | ICD-10-CM | POA: Insufficient documentation

## 2013-02-20 DIAGNOSIS — K449 Diaphragmatic hernia without obstruction or gangrene: Secondary | ICD-10-CM | POA: Insufficient documentation

## 2013-02-20 NOTE — Progress Notes (Signed)
Reviewed and agree with management.  D. , M.D., FACG  

## 2013-02-20 NOTE — Progress Notes (Signed)
    History of Present Illness:   Patient is a 79 year old female followed by Dr. Arlyce Dice for GERD and gastroparesis. She has had several upper endoscopies with findings of erosive esophagitis, a large hiatal hernia and on the 14th of this month she unders dilation of a GE junction stricture. Despite Carafate, Reglan and high dose PPI patient continues to complain of regurgitation and heartburn. She is very hoarse today. Dysphagia is better since dilation  Current Medications, Allergies, Past Medical History, Past Surgical History, Family History and Social History were reviewed in [employer] record.  Physical Exam: General: Well developed , black female in no acute distress Head: Normocephalic and atraumatic Eyes:  sclerae anicteric, conjunctiva pink  Ears: Normal auditory acuity Lungs: Clear throughout to auscultation Heart: Regular rate and rhythm Abdomen: Soft, non distended, non-tender. No masses, no hepatomegaly. Normal bowel sounds Neurological: Alert oriented x 4, grossly nonfocal Psychological:  Alert and cooperative. Normal mood and affect  Assessment and Recommendations:  79 year old female with ongoing pyrosis and regurgitation despite high dose PPI, carafate and Reglan. We documented a 7cm hiatal hernia on recent EGD. At this point will obtain UGI series for better evaluation of hernia. Patient will be referred for surgery for consideration of hiatal hernia repair.

## 2013-02-21 ENCOUNTER — Emergency Department (HOSPITAL_COMMUNITY)
Admission: EM | Admit: 2013-02-21 | Discharge: 2013-02-22 | Disposition: A | Discharge status: Home / Self Care | Payer: Medicare Other | Attending: Emergency Medicine | Admitting: Emergency Medicine

## 2013-02-21 ENCOUNTER — Emergency Department (HOSPITAL_COMMUNITY): Payer: Medicare Other

## 2013-02-21 ENCOUNTER — Telehealth: Payer: Self-pay | Admitting: *Deleted

## 2013-02-21 ENCOUNTER — Encounter (HOSPITAL_COMMUNITY): Payer: Self-pay | Admitting: Emergency Medicine

## 2013-02-21 DIAGNOSIS — R1012 Left upper quadrant pain: Secondary | ICD-10-CM | POA: Insufficient documentation

## 2013-02-21 DIAGNOSIS — G43909 Migraine, unspecified, not intractable, without status migrainosus: Secondary | ICD-10-CM | POA: Insufficient documentation

## 2013-02-21 DIAGNOSIS — I10 Essential (primary) hypertension: Secondary | ICD-10-CM | POA: Insufficient documentation

## 2013-02-21 DIAGNOSIS — F3289 Other specified depressive episodes: Secondary | ICD-10-CM | POA: Insufficient documentation

## 2013-02-21 DIAGNOSIS — R079 Chest pain, unspecified: Secondary | ICD-10-CM | POA: Insufficient documentation

## 2013-02-21 DIAGNOSIS — Z87891 Personal history of nicotine dependence: Secondary | ICD-10-CM | POA: Insufficient documentation

## 2013-02-21 DIAGNOSIS — R3989 Other symptoms and signs involving the genitourinary system: Secondary | ICD-10-CM | POA: Insufficient documentation

## 2013-02-21 DIAGNOSIS — F329 Major depressive disorder, single episode, unspecified: Secondary | ICD-10-CM | POA: Insufficient documentation

## 2013-02-21 DIAGNOSIS — Z8719 Personal history of other diseases of the digestive system: Secondary | ICD-10-CM | POA: Insufficient documentation

## 2013-02-21 DIAGNOSIS — Z86718 Personal history of other venous thrombosis and embolism: Secondary | ICD-10-CM | POA: Insufficient documentation

## 2013-02-21 DIAGNOSIS — R112 Nausea with vomiting, unspecified: Secondary | ICD-10-CM | POA: Insufficient documentation

## 2013-02-21 DIAGNOSIS — E876 Hypokalemia: Secondary | ICD-10-CM | POA: Insufficient documentation

## 2013-02-21 DIAGNOSIS — Z79899 Other long term (current) drug therapy: Secondary | ICD-10-CM | POA: Insufficient documentation

## 2013-02-21 DIAGNOSIS — F411 Generalized anxiety disorder: Secondary | ICD-10-CM | POA: Insufficient documentation

## 2013-02-21 DIAGNOSIS — G8929 Other chronic pain: Secondary | ICD-10-CM | POA: Insufficient documentation

## 2013-02-21 DIAGNOSIS — D649 Anemia, unspecified: Secondary | ICD-10-CM | POA: Insufficient documentation

## 2013-02-21 DIAGNOSIS — R1110 Vomiting, unspecified: Secondary | ICD-10-CM

## 2013-02-21 LAB — CBC WITH DIFFERENTIAL/PLATELET
Hemoglobin: 11.6 g/dL — ABNORMAL LOW (ref 12.0–15.0)
Lymphocytes Relative: 36 % (ref 12–46)
Lymphs Abs: 1.6 10*3/uL (ref 0.7–4.0)
Monocytes Absolute: 0.5 10*3/uL (ref 0.1–1.0)
Monocytes Relative: 11 % (ref 3–12)
Neutro Abs: 2.4 10*3/uL (ref 1.7–7.7)
Neutrophils Relative %: 53 % (ref 43–77)
Platelets: 210 10*3/uL (ref 150–400)
RBC: 4.02 MIL/uL (ref 3.87–5.11)
WBC: 4.5 10*3/uL (ref 4.0–10.5)

## 2013-02-21 LAB — COMPREHENSIVE METABOLIC PANEL
ALT: 19 U/L (ref 0–35)
Albumin: 4.3 g/dL (ref 3.5–5.2)
Alkaline Phosphatase: 74 U/L (ref 39–117)
BUN: 20 mg/dL (ref 6–23)
CO2: 32 mEq/L (ref 19–32)
Chloride: 101 mEq/L (ref 96–112)
GFR calc Af Amer: 76 mL/min — ABNORMAL LOW (ref >90)
Glucose, Bld: 132 mg/dL — ABNORMAL HIGH (ref 70–99)
Potassium: 3.3 mEq/L — ABNORMAL LOW (ref 3.5–5.1)
Sodium: 143 mEq/L (ref 135–145)
Total Bilirubin: 0.2 mg/dL — ABNORMAL LOW (ref 0.3–1.2)
Total Protein: 8.1 g/dL (ref 6.0–8.3)

## 2013-02-21 LAB — LIPASE, BLOOD: Lipase: 26 U/L (ref 11–59)

## 2013-02-21 MED ORDER — ONDANSETRON 4 MG PO TBDP: 4.0000 mg | ORAL_TABLET | Freq: Once | ORAL | Status: DC

## 2013-02-21 MED ORDER — ONDANSETRON 4 MG PO TBDP
4.0000 mg | ORAL_TABLET | Freq: Once | ORAL | Status: AC
  Administered 2013-02-21: 4 mg via ORAL
  Filled 2013-02-21: qty 1

## 2013-02-21 MED ORDER — ONDANSETRON HCL 4 MG PO TABS: 4.0000 mg | ORAL_TABLET | Freq: Every day | ORAL | Status: DC | PRN

## 2013-02-21 MED ORDER — ONDANSETRON HCL 4 MG/2ML IJ SOLN
4.0000 mg | Freq: Once | INTRAMUSCULAR | Status: DC
  Filled 2013-02-21: qty 2

## 2013-02-21 MED ORDER — METOCLOPRAMIDE HCL 5 MG/ML IJ SOLN
10.0000 mg | Freq: Once | INTRAMUSCULAR | Status: DC
  Filled 2013-02-21: qty 2

## 2013-02-21 MED ORDER — METOCLOPRAMIDE HCL 5 MG/ML IJ SOLN
10.0000 mg | Freq: Once | INTRAMUSCULAR | Status: AC
  Administered 2013-02-21: 10 mg via INTRAVENOUS

## 2013-02-21 MED ORDER — ONDANSETRON 4 MG PO TBDP: ORAL_TABLET | ORAL | Status: DC

## 2013-02-21 MED ORDER — DIPHENHYDRAMINE HCL 50 MG/ML IJ SOLN
12.5000 mg | Freq: Once | INTRAMUSCULAR | Status: AC
  Administered 2013-02-21: 12.5 mg via INTRAVENOUS
  Filled 2013-02-21: qty 1

## 2013-02-21 MED ORDER — POTASSIUM CHLORIDE CRYS ER 20 MEQ PO TBCR
40.0000 meq | EXTENDED_RELEASE_TABLET | Freq: Once | ORAL | Status: AC
  Administered 2013-02-21: 40 meq via ORAL
  Filled 2013-02-21: qty 2

## 2013-02-21 MED ORDER — SODIUM CHLORIDE 0.9 % IV BOLUS (SEPSIS)
1000.0000 mL | Freq: Once | INTRAVENOUS | Status: AC
  Administered 2013-02-21: 1000 mL via INTRAVENOUS

## 2013-02-21 MED ORDER — GI COCKTAIL ~~LOC~~
30.0000 mL | Freq: Once | ORAL | Status: AC
  Administered 2013-02-21: 30 mL via ORAL
  Filled 2013-02-21: qty 30

## 2013-02-21 NOTE — Telephone Encounter (Signed)
Patient wants to try oral medication before she decides to go to the ER. I will prescribe Zofran for nausea and vomiting for now, however the patient has been advised that if she cannot keep it down, she should go to the ER.

## 2013-02-21 NOTE — Telephone Encounter (Addendum)
Return pt's call - pt states she has been vomiting since Friday. She saw GI doctor on 10/27 and had UGI done yesterday; states she told the nurse about the vomiting. She was told she has a hernia and severe acid reflux and has an appt to see the surgeon 11/8.   States she has taken her medications but nothing is helping; she is weak and in the bed and has a headache. She's unable to keep anything down including water.  Instructed pt to go to the ED; states she does not transportation at this time. States she wants to try Phenergan but has not refills; states she will go to the ED if Phenergan does not help and her brother will be able to take her this afternoon when he gets off work.

## 2013-02-21 NOTE — ED Notes (Signed)
Pt c/o N/V x 3 days; pt sts recent GI issues and had study showing hiatal hernia

## 2013-02-21 NOTE — Telephone Encounter (Signed)
I would advise the patient to come to the ED as soon as she can. We can call 911 for her if she is not able to. In such a condition, when she is not able to keep anything down, feeling weak and lying in bed, not able to get up as told to me, she should not be alone at home.

## 2013-02-21 NOTE — ED Notes (Signed)
I gave the patient a warm blanket. 

## 2013-02-21 NOTE — Telephone Encounter (Signed)
I talked to pt about needing to go to the ED  Per Dr Dalphine Handing,. Pt stated she does not have the money to pay for EMS ($500); wants to try Po meds, which [facility] will deliver to her home and she will have her brother take her to the ED if she's not better.

## 2013-02-21 NOTE — Telephone Encounter (Signed)
Talked to pt - informed her new rx for Zofran and she stated she talked to her brother this morning and he will call her back on his lunch break to check on her. And she knows to go to the ED if med does not help. Pt very appreciative.

## 2013-02-21 NOTE — ED Provider Notes (Signed)
CSN: [id number]     Arrival date & time 02/21/13  1703 History   First MD Initiated Contact with Patient 02/21/13 2205     Chief Complaint  Patient presents with  . Emesis   (Consider location/radiation/quality/duration/timing/severity/associated sxs/prior Treatment) HPI Comments: 79 year-old female with nausea and vomiting for the past 3-4 days. This worsened yesterday. She not had any blood. She's not had any bloody stools. She has a normal bowel movement this morning. She is having some upper abdominal pains as the vomiting has worsened as well as a burning going up her chest. She has really diagnosed with a hiatal hernia and had an upper GI series yesterday. She denies any other focal chest pain or shortness of breath. She denies any fevers. She has had some decreased urine output but it does not hurt. Since she was in the waiting room she was able to drink some soup without vomiting. She's also had about half of bottle of water. She was prescribed an anti-medic by her PCP but she which type it is. Which she tolerated home she vomited it back up.   Past Medical History  Diagnosis Date  . Chronic pain     DJD knees, back, migranes, LLE varicose veins, and LLE neuropathy.   . Aortic stenosis     Dx ECHO 2008 , mild and aymptomatic , needs ECHO q 3-5 yrs  . Barrett's esophagus     Demonstrated on EGD 12/2010. EGD 09/17/2012 shows inflamed GE junctional mucosa without metaplasia, dysplasia, or malignancy.  . Chronic insomnia   . Chronic anxiety     Admission to [facility] (90s or early 2000)  for 6 weeks after mother died. Complicated again by death of her sister 2010. 2012 developed hallucinations and I refused to refill controlled meds unless she see psych which she is not agreable to  . Elevated cholesterol 10/11    LDL 155. Her 10 year risk (decreasing her age to 21 as the calculator won't go to age 50) is 21% ish. If consider her non smoker (which I wouldn't even though she says 1 pak lasts 2  weeks) risk is 12% ish. So goal for LDL is 130 or 100.  Marland Kitchen HTN (hypertension)     Controlled with 2 drug therapy  . Migraine   . Cataract   . Depression   . Gastroparesis     Demonstrated on GES 12/2010 by Dr Dalene Seltzer  . Bronchitis     hx of  . Left leg DVT 09/28/2012  . Anemia 12/14/2012   Past Surgical History  Procedure Laterality Date  . Direct laryngoscopy   September 2008     preoperative diagnosis hoarseness with anterior right vocal cord lesion -  direct laryngoscopy and excisional biopsy of right anterior vocal cord lesion done by Dr. Ezzard Standing  . Meniscectomy   July 2002     preoperative diagnosis torn medial meniscus right knee, partial medial meniscectomy, debridement chondroplasty patellofemoral joint, done by Dr. Madelon Lips  . Abdominal hysterectomy    . Back surgery    . Hemorrhoid surgery    . Appendectomy    . Cataract extraction      left eye  . Polypectomy  2000    Dr.Magod  . Colonoscopy  2000&2005    Dr.Magod  . Esophagogastroduodenoscopy  11/2010  . Knee arthroscopy    . Eye surgery    . Rotator cuff repair  09/21/2011    rt shoulder  . Esophagogastroduodenoscopy N/A 09/17/2012  Procedure: ESOPHAGOGASTRODUODENOSCOPY (EGD);  Surgeon: Hart Carwin, MD;  Location: [facility] ENDOSCOPY;  Service: Endoscopy;  Laterality: N/A;  . Esophagogastroduodenoscopy N/A 12/17/2012    Procedure: ESOPHAGOGASTRODUODENOSCOPY (EGD);  Surgeon: Beverley Fiedler, MD;  Location: [facility] ENDOSCOPY;  Service: Gastroenterology;  Laterality: N/A;   Family History  Problem Relation Age of Onset  . Stroke Neg Hx   . Cancer Neg Hx   . Colon cancer Neg Hx   . Anesthesia problems Neg Hx   . Hypotension Neg Hx   . Malignant hyperthermia Neg Hx   . Pseudochol deficiency Neg Hx   . Heart disease Mother   . Hypertension Mother    History  Substance Use Topics  . Smoking status: Former Smoker    Types: Cigarettes    Quit date: 06/01/2010  . Smokeless tobacco: Never Used  . Alcohol Use: No   OB History    Grav Para Term Preterm Abortions TAB SAB Ect Mult Living                 Review of Systems  Respiratory: Negative for shortness of breath.   Cardiovascular: Positive for chest pain (burning).  Gastrointestinal: Positive for nausea, vomiting and abdominal pain (worst with vomiting). Negative for diarrhea, constipation and blood in stool.  Genitourinary: Positive for decreased urine volume. Negative for dysuria.  Musculoskeletal: Negative for back pain.  Neurological: Positive for headaches.  All other systems reviewed and are negative.    Allergies  Milk-related compounds and Aspirin  Home Medications   Current Outpatient Rx  Name  Route  Sig  Dispense  Refill  . acetaminophen-codeine (TYLENOL #3) 300-30 MG per tablet   Oral   Take 1 tablet by mouth every 4 (four) hours as needed for pain.   45 tablet   0   . amLODipine (NORVASC) 10 MG tablet               . antiseptic oral rinse (BIOTENE) LIQD   Mouth Rinse   15 mLs by Mouth Rinse route as needed.         . citalopram (CELEXA) 40 MG tablet   Oral   Take 1 tablet (40 mg total) by mouth daily.   30 tablet   3     Discontinue any other prescription for Celexa.   . clonazePAM (KLONOPIN) 0.25 MG disintegrating tablet               . docusate sodium (COLACE) 100 MG capsule   Oral   Take 1 capsule (100 mg total) by mouth 2 (two) times daily as needed for constipation.   60 capsule   2   . ferrous sulfate 325 (65 FE) MG tablet   Oral   Take 1 tablet (325 mg total) by mouth daily with breakfast.   90 tablet   2   . furosemide (LASIX) 40 MG tablet   Oral   Take 1 tablet (40 mg total) by mouth daily.   30 tablet   2   . HYDROcodone-acetaminophen (NORCO/VICODIN) 5-325 MG per tablet   Oral   Take 1 tablet by mouth every 8 (eight) hours as needed for pain.   40 tablet   0   . KLOR-CON M10 10 MEQ tablet               . metoCLOPramide (REGLAN) 5 MG tablet   Oral   Take 1 tablet (5 mg total) by  mouth 3 (three) times daily before meals.  90 tablet   1   . ondansetron (ZOFRAN) 4 MG tablet   Oral   Take 1 tablet (4 mg total) by mouth daily as needed for nausea.   30 tablet   0   . pantoprazole (PROTONIX) 40 MG tablet   Oral   Take 1 tablet (40 mg total) by mouth 2 (two) times daily.   60 tablet   3   . potassium chloride (K-DUR) 10 MEQ tablet   Oral   Take 1 tablet (10 mEq total) by mouth daily.   30 tablet   0   . sucralfate (CARAFATE) 1 G tablet   Oral   Take 1 tablet (1 g total) by mouth 4 (four) times daily.   120 tablet   4    BP 131/66  Pulse 63  Temp(Src) 98.7 F (37.1 C) (Oral)  Resp 18  SpO2 96%  LMP 04/26/1950 Physical Exam  Nursing note and vitals reviewed. Constitutional: She is oriented to person, place, and time. She appears well-developed and well-nourished. No distress.  HENT:  Head: Normocephalic and atraumatic.  Right Ear: External ear normal.  Left Ear: External ear normal.  Nose: Nose normal.  Eyes: Right eye exhibits no discharge. Left eye exhibits no discharge.  Cardiovascular: Normal rate, regular rhythm and normal heart sounds.   Pulmonary/Chest: Effort normal and breath sounds normal.  Abdominal: Soft. There is tenderness in the left upper quadrant.  Multiple surgical scars  Neurological: She is alert and oriented to person, place, and time.  Skin: Skin is warm and dry.    ED Course  Procedures (including critical care time) Labs Review Labs Reviewed  CBC WITH DIFFERENTIAL - Abnormal; Notable for the following:    Hemoglobin 11.6 (*)    HCT 35.3 (*)    All other components within normal limits  COMPREHENSIVE METABOLIC PANEL - Abnormal; Notable for the following:    Potassium 3.3 (*)    Glucose, Bld 132 (*)    Total Bilirubin 0.2 (*)    GFR calc non Af Amer 66 (*)    GFR calc Af Amer 76 (*)    All other components within normal limits  LIPASE, BLOOD  TROPONIN I   Imaging Review Dg Ugi W/high Density  W/kub  02/20/2013   CLINICAL DATA:  Reflux, hiatal hernia. Recent esophageal dilatation.  EXAM: UPPER GI SERIES W/HIGH DENSITY W/KUB  TECHNIQUE: After obtaining a scout radiograph a routine upper GI series was performed using thin barium  COMPARISON:  None.  FLUOROSCOPY TIME:  2 min, 2 seconds  FINDINGS: Fluoroscopic evaluation of swallowing demonstrates normal appearance of the pharynx and cervical esophagus. Normal esophageal motility. No fixed esophageal stricture, fold thickening or mass. Moderate-sized hiatal hernia. There is spontaneous gastroesophageal reflux into the upper cervical esophagus.  Stomach, duodenal bulb and duodenal sweep are unremarkable. No ulceration, fold thickening or mass.  IMPRESSION: Moderate-sized hiatal hernia. Spontaneous gastroesophageal reflux into the upper thoracic esophagus.   Electronically Signed   By: Charlett Nose M.D.   On: 02/20/2013 11:39    EKG Interpretation     Ventricular Rate:  64 PR Interval:  148 QRS Duration: 86 QT Interval:  413 QTC Calculation: 426 R Axis:   35 Text Interpretation:  Sinus rhythm Low voltage, precordial leads No significant change since last tracing            MDM   1. Vomiting   2. Hypokalemia    Patient appears well, is able to tolerate POs. The  vomiting is likely from her hiatal hernia, and less likely from ACS, acute abd pathology, or infectious cause. Given fluids and po K replacement in ED. Labs, EKG and Xray are benign. Patient ok with going home, will d/c with anti-emetics and strict return precautions.    Audree Camel, MD 02/22/13 [phone]

## 2013-03-13 ENCOUNTER — Encounter (INDEPENDENT_AMBULATORY_CARE_PROVIDER_SITE_OTHER): Payer: Self-pay

## 2013-03-13 ENCOUNTER — Ambulatory Visit (INDEPENDENT_AMBULATORY_CARE_PROVIDER_SITE_OTHER): Payer: Medicare Other | Admitting: Surgery

## 2013-03-13 DIAGNOSIS — K219 Gastro-esophageal reflux disease without esophagitis: Secondary | ICD-10-CM

## 2013-03-13 NOTE — Patient Instructions (Signed)
Nissen Fundoplication Care After Please read the instructions outlined below and refer to this sheet for the next few weeks. These discharge instructions provide you with general information on caring for yourself after you leave the hospital. Your doctor may also give you specific instructions. While your treatment has been planned according to the most current medical practices available, unavoidable complications sometimes happen. If you have any problems or questions after discharge, please call your doctor. ACTIVITY  Take frequent rest periods throughout the day.  Take frequent walks throughout the day. This will help to prevent blood clots.  Continue to do your coughing and deep breathing exercises once you get home. This will help to prevent pneumonia.  No strenuous activities such as heavy lifting, pushing or pulling until after your follow-up visit with your doctor. Do not lift anything heavier than 10 pounds.  Talk with your caregiver about when you may return to work and your exercise routine.  You may shower 2 days after surgery. Pat incisions dry. Do not rub incisions with washcloth or towel.  Do not drive while taking prescription pain medication. NUTRITION  Continue with a liquid diet, or the diet you were directed to take, until your first follow-up visit with your surgeon.  Drink fluids (6-8 glasses a day).  Call your caregiver for persistent nausea (feeling sick to your stomach), vomiting, bloating or difficulty swallowing. ELIMINATION It is very important not to strain during bowel movements. If constipation should occur, you may:  Take a mild laxative (such as Milk of Magnesia).  Add fruit and bran to your diet.  Drink more fluids.  Call your caregiver if constipation is not relieved. FEVER If you feel feverish or have shaking chills, take your temperature. If it is 102 F (38.9 C) or above, call your caregiver. The fever may mean there is an infection. PAIN  CONTROL  If a prescription was given for a pain reliever, please follow your caregiver's directions.  Only take over-the-counter or prescription medicines for pain, discomfort, or fever as directed by your caregiver.  If the pain is not relieved by your medicine, becomes worse, or you have difficulty breathing, call your doctor. INCISION  It is normal for your cuts (incisions) from surgery to have a small amount of drainage for the first 1-2 days. Once the drainage has stopped, leave your incision(s) open to air.  Check your incision(s) and surrounding area daily for any redness, swelling, increased drainage or bleeding. If any of these are present or if the wound edges start to separate, call your doctor.  If you have small adhesive strips in place, they will peel and fall off. (If these strips are covered with a clear bandage, your doctor will tell you when to remove them.)  If you have staples, your caregiver will remove them at the follow-up appointment. Document Released: 12/04/2003 Document Revised: 07/05/2011 Document Reviewed: 03/09/2007 Exit[employer] Patient Information 2014 [employer].  

## 2013-03-13 NOTE — Progress Notes (Signed)
Chief Complaint:  Hiatal hernia with symptomatic gastroesophageal reflux  History of Present Illness:  Sherri Mcmillan is an 79 y.o. female who presents having been worked up by [facility] with upper endoscopy and with upper GI for symptomatic reflux. Her main problems or mechanical reflux when she bends over and also reflux upper esophagus and out her nose at night. She is very much bothered by this and the hoarseness of fracture her ability to seen. She's been treated with PPIs with improvement acid but mechanical aspect is something that bothers her and she would like to have surgery to correct this.  I gave her a pamphlet on this explained the procedure to her and her friend who is from [place] with laparoscopic surgery since he had a kidney transplant. I described the operation some detail in terms of its risk and benefits the possibility of having had done open. She was went proceed with scheduling.  Past Medical History  Diagnosis Date  . Chronic pain     DJD knees, back, migranes, LLE varicose veins, and LLE neuropathy.   . Aortic stenosis     Dx ECHO 2008 , mild and aymptomatic , needs ECHO q 3-5 yrs  . Barrett's esophagus     Demonstrated on EGD 12/2010. EGD 09/17/2012 shows inflamed GE junctional mucosa without metaplasia, dysplasia, or malignancy.  . Chronic insomnia   . Chronic anxiety     Admission to [facility] (90s or early 2000)  for 6 weeks after mother died. Complicated again by death of her sister 2010. 2012 developed hallucinations and I refused to refill controlled meds unless she see psych which she is not agreable to  . Elevated cholesterol 10/11    LDL 155. Her 10 year risk (decreasing her age to 28 as the calculator won't go to age 37) is 21% ish. If consider her non smoker (which I wouldn't even though she says 1 pak lasts 2 weeks) risk is 12% ish. So goal for LDL is 130 or 100.  Marland Kitchen HTN (hypertension)     Controlled with 2 drug therapy  . Migraine   . Cataract   . Depression     . Gastroparesis     Demonstrated on GES 12/2010 by Dr Dalene Seltzer  . Bronchitis     hx of  . Left leg DVT 09/28/2012  . Anemia 12/14/2012    Past Surgical History  Procedure Laterality Date  . Direct laryngoscopy   September 2008     preoperative diagnosis hoarseness with anterior right vocal cord lesion -  direct laryngoscopy and excisional biopsy of right anterior vocal cord lesion done by Dr. Ezzard Standing  . Meniscectomy   July 2002     preoperative diagnosis torn medial meniscus right knee, partial medial meniscectomy, debridement chondroplasty patellofemoral joint, done by Dr. Madelon Lips  . Abdominal hysterectomy    . Back surgery    . Hemorrhoid surgery    . Appendectomy    . Cataract extraction      left eye  . Polypectomy  2000    Dr.Magod  . Colonoscopy  2000&2005    Dr.Magod  . Esophagogastroduodenoscopy  11/2010  . Knee arthroscopy    . Eye surgery    . Rotator cuff repair  09/21/2011    rt shoulder  . Esophagogastroduodenoscopy N/A 09/17/2012    Procedure: ESOPHAGOGASTRODUODENOSCOPY (EGD);  Surgeon: Hart Carwin, MD;  Location: [facility] ENDOSCOPY;  Service: Endoscopy;  Laterality: N/A;  . Esophagogastroduodenoscopy N/A 12/17/2012    Procedure: ESOPHAGOGASTRODUODENOSCOPY (  EGD);  Surgeon: Beverley Fiedler, MD;  Location: [facility] ENDOSCOPY;  Service: Gastroenterology;  Laterality: N/A;    Current Outpatient Prescriptions  Medication Sig Dispense Refill  . acetaminophen-codeine (TYLENOL #3) 300-30 MG per tablet Take 1 tablet by mouth every 4 (four) hours as needed for pain.  45 tablet  0  . amLODipine (NORVASC) 10 MG tablet       . antiseptic oral rinse (BIOTENE) LIQD 15 mLs by Mouth Rinse route as needed.      . citalopram (CELEXA) 40 MG tablet Take 1 tablet (40 mg total) by mouth daily.  30 tablet  3  . clonazePAM (KLONOPIN) 0.25 MG disintegrating tablet       . docusate sodium (COLACE) 100 MG capsule Take 1 capsule (100 mg total) by mouth 2 (two) times daily as needed for constipation.  60  capsule  2  . ferrous sulfate 325 (65 FE) MG tablet Take 1 tablet (325 mg total) by mouth daily with breakfast.  90 tablet  2  . furosemide (LASIX) 40 MG tablet Take 1 tablet (40 mg total) by mouth daily.  30 tablet  2  . HYDROcodone-acetaminophen (NORCO/VICODIN) 5-325 MG per tablet Take 1 tablet by mouth every 8 (eight) hours as needed for pain.  40 tablet  0  . KLOR-CON M10 10 MEQ tablet       . metoCLOPramide (REGLAN) 5 MG tablet Take 1 tablet (5 mg total) by mouth 3 (three) times daily before meals.  90 tablet  1  . ondansetron (ZOFRAN ODT) 4 MG disintegrating tablet 4mg  ODT q4 hours prn nausea/vomit  10 tablet  0  . ondansetron (ZOFRAN) 4 MG tablet Take 1 tablet (4 mg total) by mouth daily as needed for nausea.  30 tablet  0  . pantoprazole (PROTONIX) 40 MG tablet Take 1 tablet (40 mg total) by mouth 2 (two) times daily.  60 tablet  3  . potassium chloride (K-DUR) 10 MEQ tablet Take 1 tablet (10 mEq total) by mouth daily.  30 tablet  0  . sucralfate (CARAFATE) 1 G tablet Take 1 tablet (1 g total) by mouth 4 (four) times daily.  120 tablet  4   No current facility-administered medications for this visit.   Milk-related compounds and Aspirin Family History  Problem Relation Age of Onset  . Stroke Neg Hx   . Cancer Neg Hx   . Colon cancer Neg Hx   . Anesthesia problems Neg Hx   . Hypotension Neg Hx   . Malignant hyperthermia Neg Hx   . Pseudochol deficiency Neg Hx   . Heart disease Mother   . Hypertension Mother    Social History:   reports that she quit smoking about 2 years ago. Her smoking use included Cigarettes. She smoked 0.00 packs per day. She has never used smokeless tobacco. She reports that she does not drink alcohol or use illicit drugs.   REVIEW OF SYSTEMS - PERTINENT POSITIVES ONLY: history of DVT. Otherwise she reports she is healthy  Physical Exam:   Last menstrual period 04/26/1950. There is no weight on file to calculate BMI.  Gen:  WDWN African American  female NAD  Neurological: Alert and oriented to person, place, and time. Motor and sensory function is grossly intact  Head: Normocephalic and atraumatic.  Eyes: Conjunctivae are normal. Pupils are equal, round, and reactive to light. No scleral icterus.  Neck: Normal range of motion. Neck supple. No tracheal deviation or thyromegaly present.  Cardiovascular:  SR  without murmurs or gallops.  No carotid bruits Respiratory: Effort normal.  No respiratory distress. No chest wall tenderness. Breath sounds normal.  No wheezes, rales or rhonchi.  Abdomen:  Nontender. No masses or organomegaly noted. GU: Musculoskeletal: Normal range of motion. Extremities are nontender. No cyanosis, edema or clubbing noted Lymphadenopathy: No cervical, preauricular, postauricular or axillary adenopathy is present Skin: Skin is warm and dry. No rash noted. No diaphoresis. No erythema. No pallor. Pscyh: Normal mood and affect. Behavior is normal. Judgment and thought content normal.   LABORATORY RESULTS: No results found for this or any previous visit (from the past 48 hour(s)).  RADIOLOGY RESULTS: No results found.  Problem List: Patient Active Problem List   Diagnosis Date Noted  . Hiatal hernia 02/19/2013  . Hypokalemia 02/03/2013  . Acute blood loss anemia 12/15/2012  . Other specified anemias 12/14/2012  . Anemia 12/14/2012  . Spitting blood 12/12/2012  . Anemia, unspecified 12/12/2012  . Warfarin-induced coagulopathy 12/11/2012  . Left leg DVT 10/05/2012  . Stricture and stenosis of esophagus 09/17/2012  . Reflux esophagitis 09/17/2012  . Bicipital tendinitis of right shoulder 08/26/2011  . Rotator cuff syndrome of right shoulder 08/09/2011  . Barrett's esophagus 01/05/2011  . Gastroparesis 01/05/2011  . GERD (gastroesophageal reflux disease) 08/03/2010  . Chronic pain   . Elevated cholesterol   . Chronic anxiety   . HTN (hypertension)   . Aortic stenosis   . INSOMNIA, PERSISTENT 07/27/2006    . VENOUS INSUFFICIENCY 07/06/2006    Assessment & Plan: Symptomatic hiatal hernia with gastroesophageal reflux disease. Plan laparoscopic hiatal hernia repair and Nissen fundoplication.    Matt B. Daphine Deutscher, MD, [facility] [phone] beeper [phone]  03/13/2013 4:04 PM

## 2013-03-20 ENCOUNTER — Ambulatory Visit: Payer: Medicare Other | Admitting: Gastroenterology

## 2013-03-30 ENCOUNTER — Emergency Department (HOSPITAL_COMMUNITY)
Admission: EM | Admit: 2013-03-30 | Discharge: 2013-03-30 | Disposition: A | Discharge status: Home / Self Care | Payer: Medicare Other | Attending: Emergency Medicine | Admitting: Emergency Medicine

## 2013-03-30 ENCOUNTER — Encounter (HOSPITAL_COMMUNITY): Payer: Self-pay | Admitting: Emergency Medicine

## 2013-03-30 ENCOUNTER — Emergency Department (HOSPITAL_COMMUNITY): Payer: Medicare Other

## 2013-03-30 DIAGNOSIS — F3289 Other specified depressive episodes: Secondary | ICD-10-CM | POA: Insufficient documentation

## 2013-03-30 DIAGNOSIS — Z8639 Personal history of other endocrine, nutritional and metabolic disease: Secondary | ICD-10-CM | POA: Insufficient documentation

## 2013-03-30 DIAGNOSIS — I10 Essential (primary) hypertension: Secondary | ICD-10-CM | POA: Insufficient documentation

## 2013-03-30 DIAGNOSIS — Z79899 Other long term (current) drug therapy: Secondary | ICD-10-CM | POA: Insufficient documentation

## 2013-03-30 DIAGNOSIS — Z9849 Cataract extraction status, unspecified eye: Secondary | ICD-10-CM | POA: Insufficient documentation

## 2013-03-30 DIAGNOSIS — K2270 Barrett's esophagus without dysplasia: Secondary | ICD-10-CM | POA: Insufficient documentation

## 2013-03-30 DIAGNOSIS — Z87891 Personal history of nicotine dependence: Secondary | ICD-10-CM | POA: Insufficient documentation

## 2013-03-30 DIAGNOSIS — J069 Acute upper respiratory infection, unspecified: Secondary | ICD-10-CM

## 2013-03-30 DIAGNOSIS — Z862 Personal history of diseases of the blood and blood-forming organs and certain disorders involving the immune mechanism: Secondary | ICD-10-CM | POA: Insufficient documentation

## 2013-03-30 DIAGNOSIS — G8929 Other chronic pain: Secondary | ICD-10-CM | POA: Insufficient documentation

## 2013-03-30 DIAGNOSIS — F329 Major depressive disorder, single episode, unspecified: Secondary | ICD-10-CM | POA: Insufficient documentation

## 2013-03-30 DIAGNOSIS — Z86718 Personal history of other venous thrombosis and embolism: Secondary | ICD-10-CM | POA: Insufficient documentation

## 2013-03-30 DIAGNOSIS — D649 Anemia, unspecified: Secondary | ICD-10-CM | POA: Insufficient documentation

## 2013-03-30 DIAGNOSIS — F411 Generalized anxiety disorder: Secondary | ICD-10-CM | POA: Insufficient documentation

## 2013-03-30 DIAGNOSIS — J40 Bronchitis, not specified as acute or chronic: Secondary | ICD-10-CM

## 2013-03-30 DIAGNOSIS — G4700 Insomnia, unspecified: Secondary | ICD-10-CM | POA: Insufficient documentation

## 2013-03-30 DIAGNOSIS — G43909 Migraine, unspecified, not intractable, without status migrainosus: Secondary | ICD-10-CM | POA: Insufficient documentation

## 2013-03-30 MED ORDER — HYDROCODONE-ACETAMINOPHEN 5-325 MG PO TABS: 1.0000 | ORAL_TABLET | Freq: Four times a day (QID) | ORAL | Status: DC | PRN

## 2013-03-30 MED ORDER — ALBUTEROL SULFATE (5 MG/ML) 0.5% IN NEBU
5.0000 mg | INHALATION_SOLUTION | Freq: Once | RESPIRATORY_TRACT | Status: AC
  Administered 2013-03-30: 5 mg via RESPIRATORY_TRACT
  Filled 2013-03-30: qty 1

## 2013-03-30 MED ORDER — IPRATROPIUM BROMIDE 0.02 % IN SOLN
0.5000 mg | Freq: Once | RESPIRATORY_TRACT | Status: AC
  Administered 2013-03-30: 0.5 mg via RESPIRATORY_TRACT
  Filled 2013-03-30: qty 2.5

## 2013-03-30 MED ORDER — ALBUTEROL SULFATE (5 MG/ML) 0.5% IN NEBU
INHALATION_SOLUTION | RESPIRATORY_TRACT | Status: AC
  Filled 2013-03-30: qty 1

## 2013-03-30 MED ORDER — ALBUTEROL SULFATE HFA 108 (90 BASE) MCG/ACT IN AERS: 2.0000 | INHALATION_SPRAY | RESPIRATORY_TRACT | Status: DC | PRN

## 2013-03-30 NOTE — ED Notes (Signed)
Pt returned from radiology.

## 2013-03-30 NOTE — ED Notes (Signed)
Pt reports on Saturday she started to have an itchy throat and hoarse voice then it progressively became worse with a cough and hasn't been able to sleep through the night. sts she has tried taking some OTC meds and teas but hasn't gotten any relief. Nad, skin warm and dry, resp e/u.

## 2013-03-30 NOTE — ED Notes (Signed)
PA asked that pt be moved to the acute side.

## 2013-03-30 NOTE — ED Provider Notes (Signed)
CSN: [id number]     Arrival date & time 03/30/13  1007 History   First MD Initiated Contact with Patient 03/30/13 1034     Chief Complaint  Patient presents with  . URI   (Consider location/radiation/quality/duration/timing/severity/associated sxs/prior Treatment) Patient is a 79 y.o. female presenting with cough.  Cough Cough characteristics:  Productive Sputum characteristics:  Green and clear Severity:  Moderate Onset quality:  Gradual Duration:  3 days Timing:  Intermittent Progression:  Worsening Chronicity:  New Smoker: no   Context: not animal exposure, not exposure to allergens, not fumes, not occupational exposure, not sick contacts, not smoke exposure, not upper respiratory infection and not weather changes   Relieved by:  Nothing Ineffective treatments:  Cough suppressants, decongestant and steam Associated symptoms: chest pain, rhinorrhea and wheezing   Associated symptoms: no chills, no diaphoresis (with cough), no ear fullness, no ear pain, no eye discharge, no fever, no headaches, no myalgias, no rash, no shortness of breath, no sinus congestion, no sore throat and no weight loss     Past Medical History  Diagnosis Date  . Chronic pain     DJD knees, back, migranes, LLE varicose veins, and LLE neuropathy.   . Aortic stenosis     Dx ECHO 2008 , mild and aymptomatic , needs ECHO q 3-5 yrs  . Barrett's esophagus     Demonstrated on EGD 12/2010. EGD 09/17/2012 shows inflamed GE junctional mucosa without metaplasia, dysplasia, or malignancy.  . Chronic insomnia   . Chronic anxiety     Admission to [facility] (90s or early 2000)  for 6 weeks after mother died. Complicated again by death of her sister 2010. 2012 developed hallucinations and I refused to refill controlled meds unless she see psych which she is not agreable to  . Elevated cholesterol 10/11    LDL 155. Her 10 year risk (decreasing her age to 81 as the calculator won't go to age 80) is 21% ish. If consider her non  smoker (which I wouldn't even though she says 1 pak lasts 2 weeks) risk is 12% ish. So goal for LDL is 130 or 100.  Marland Kitchen HTN (hypertension)     Controlled with 2 drug therapy  . Migraine   . Cataract   . Depression   . Gastroparesis     Demonstrated on GES 12/2010 by Dr Dalene Seltzer  . Bronchitis     hx of  . Left leg DVT 09/28/2012  . Anemia 12/14/2012   Past Surgical History  Procedure Laterality Date  . Direct laryngoscopy   September 2008     preoperative diagnosis hoarseness with anterior right vocal cord lesion -  direct laryngoscopy and excisional biopsy of right anterior vocal cord lesion done by Dr. Ezzard Standing  . Meniscectomy   July 2002     preoperative diagnosis torn medial meniscus right knee, partial medial meniscectomy, debridement chondroplasty patellofemoral joint, done by Dr. Madelon Lips  . Abdominal hysterectomy    . Back surgery    . Hemorrhoid surgery    . Appendectomy    . Cataract extraction      left eye  . Polypectomy  2000    Dr.Magod  . Colonoscopy  2000&2005    Dr.Magod  . Esophagogastroduodenoscopy  11/2010  . Knee arthroscopy    . Eye surgery    . Rotator cuff repair  09/21/2011    rt shoulder  . Esophagogastroduodenoscopy N/A 09/17/2012    Procedure: ESOPHAGOGASTRODUODENOSCOPY (EGD);  Surgeon: Hart Carwin, MD;  Location:  [facility] ENDOSCOPY;  Service: Endoscopy;  Laterality: N/A;  . Esophagogastroduodenoscopy N/A 12/17/2012    Procedure: ESOPHAGOGASTRODUODENOSCOPY (EGD);  Surgeon: Beverley Fiedler, MD;  Location: [facility] ENDOSCOPY;  Service: Gastroenterology;  Laterality: N/A;   Family History  Problem Relation Age of Onset  . Stroke Neg Hx   . Cancer Neg Hx   . Colon cancer Neg Hx   . Anesthesia problems Neg Hx   . Hypotension Neg Hx   . Malignant hyperthermia Neg Hx   . Pseudochol deficiency Neg Hx   . Heart disease Mother   . Hypertension Mother    History  Substance Use Topics  . Smoking status: Former Smoker    Types: Cigarettes    Quit date: 06/01/2010  .  Smokeless tobacco: Never Used  . Alcohol Use: No   OB History   Grav Para Term Preterm Abortions TAB SAB Ect Mult Living                 Review of Systems  Constitutional: Negative for fever, chills, weight loss and diaphoresis (with cough).  HENT: Positive for rhinorrhea. Negative for ear pain and sore throat.   Eyes: Negative for discharge.  Respiratory: Positive for cough and wheezing. Negative for shortness of breath.   Cardiovascular: Positive for chest pain.  Musculoskeletal: Negative for myalgias.  Skin: Negative for rash.  Neurological: Negative for headaches.    Allergies  Milk-related compounds and Aspirin  Home Medications   Current Outpatient Rx  Name  Route  Sig  Dispense  Refill  . amLODipine (NORVASC) 10 MG tablet   Oral   Take 10 mg by mouth daily.          . citalopram (CELEXA) 40 MG tablet   Oral   Take 1 tablet (40 mg total) by mouth daily.   30 tablet   3     Discontinue any other prescription for Celexa.   . clonazePAM (KLONOPIN) 0.25 MG disintegrating tablet   Oral   Take 0.25 mg by mouth 3 (three) times daily as needed (anxiety).          Marland Kitchen docusate sodium (COLACE) 100 MG capsule   Oral   Take 100 mg by mouth daily.         . ferrous sulfate 325 (65 FE) MG tablet   Oral   Take 1 tablet (325 mg total) by mouth daily with breakfast.   90 tablet   2   . furosemide (LASIX) 40 MG tablet   Oral   Take 60 mg by mouth daily.         Marland Kitchen KLOR-CON M10 10 MEQ tablet   Oral   Take 10 mEq by mouth daily.          . metoCLOPramide (REGLAN) 5 MG tablet   Oral   Take 1 tablet (5 mg total) by mouth 3 (three) times daily before meals.   90 tablet   1   . ondansetron (ZOFRAN-ODT) 4 MG disintegrating tablet   Oral   Take 4 mg by mouth every 8 (eight) hours as needed for nausea or vomiting.         . pantoprazole (PROTONIX) 40 MG tablet   Oral   Take 1 tablet (40 mg total) by mouth 2 (two) times daily.   60 tablet   3   .  sucralfate (CARAFATE) 1 G tablet   Oral   Take 1 tablet (1 g total) by mouth 4 (four) times daily.  120 tablet   4    BP 126/65  Pulse 60  Temp(Src) 97.9 F (36.6 C)  Resp 18  SpO2 100%  LMP 04/26/1950 Physical Exam  Nursing note and vitals reviewed. Constitutional: She is oriented to person, place, and time. She appears well-developed and well-nourished. No distress.  HENT:  Head: Normocephalic and atraumatic.  Eyes: Conjunctivae are normal.  Neck: Normal range of motion. Neck supple. No JVD present.  Cardiovascular: Normal rate, regular rhythm and normal heart sounds.   Pulmonary/Chest: She has wheezes.  Tight bronchitic cough w/expiratory wheeze  Abdominal: Soft. Bowel sounds are normal. There is no tenderness.  Musculoskeletal: She exhibits no edema.  Lymphadenopathy:    She has no cervical adenopathy.  Neurological: She is alert and oriented to person, place, and time.  Skin: Skin is warm and dry.    ED Course  Procedures (including critical care time) Labs Review Labs Reviewed - No data to display Imaging Review Dg Chest 2 View  03/30/2013   CLINICAL DATA:  79 year old female with productive cough chest pain and shortness of Breath. Initial encounter.  EXAM: CHEST  2 VIEW  COMPARISON:  02/21/2013 and earlier.  FINDINGS: Lung volumes are stable and within normal limits. Mild eventration of the diaphragm. Normal cardiac size and mediastinal contours. Visualized tracheal air column is within normal limits. No pneumothorax, pulmonary edema, pleural effusion or acute pulmonary opacity. Stable visualized osseous structures.  IMPRESSION: No active cardiopulmonary disease.   Electronically Signed   By: Augusto Gamble M.D.   On: 03/30/2013 11:12    EKG Interpretation   None       MDM   1. Bronchitis   2. URI (upper respiratory infection)    . Patients symptoms are consistent with URI, likely viral etiology. Discussed that antibiotics are not indicated for viral  infections. Pt will be discharged with symptomatic treatment.  Verbalizes understanding and is agreeable with plan. Pt is hemodynamically stable & in NAD prior to dc.  The patient appears reasonably screened and/or stabilized for discharge and I doubt any other medical condition or other [facility] requiring further screening, evaluation, or treatment in the ED at this time prior to discharge.    Arthor Captain, PA-C 03/30/13 [phone]

## 2013-03-30 NOTE — ED Notes (Signed)
Discharge instructions reviewed with pt. Pt verbalized understanding.   

## 2013-03-30 NOTE — ED Notes (Signed)
Per pt sts 3 days of cough and congestion. Taking OTC meds without relief.

## 2013-04-04 ENCOUNTER — Ambulatory Visit: Payer: Medicare Other | Admitting: Internal Medicine

## 2013-04-08 ENCOUNTER — Other Ambulatory Visit: Payer: Self-pay | Admitting: Gastroenterology

## 2013-04-09 NOTE — ED Provider Notes (Signed)
Medical screening examination/treatment/procedure(s) were performed by non-physician practitioner and as supervising physician I was immediately available for consultation/collaboration.  EKG Interpretation   None         Roney Marion, MD 04/09/13 2239

## 2013-04-16 ENCOUNTER — Encounter (HOSPITAL_COMMUNITY): Payer: Self-pay | Admitting: Pharmacy Technician

## 2013-04-24 ENCOUNTER — Encounter (HOSPITAL_COMMUNITY): Payer: Self-pay

## 2013-04-24 ENCOUNTER — Encounter (HOSPITAL_COMMUNITY)
Admission: RE | Admit: 2013-04-24 | Discharge: 2013-04-24 | Disposition: A | Discharge status: Home / Self Care | Payer: Medicare Other | Source: Ambulatory Visit | Attending: Surgery | Admitting: Surgery

## 2013-04-24 ENCOUNTER — Encounter (INDEPENDENT_AMBULATORY_CARE_PROVIDER_SITE_OTHER): Payer: Self-pay

## 2013-04-24 DIAGNOSIS — Z01812 Encounter for preprocedural laboratory examination: Secondary | ICD-10-CM | POA: Insufficient documentation

## 2013-04-24 DIAGNOSIS — Z01818 Encounter for other preprocedural examination: Secondary | ICD-10-CM | POA: Insufficient documentation

## 2013-04-24 HISTORY — DX: Personal history of other diseases of the digestive system: Z87.19

## 2013-04-24 LAB — BASIC METABOLIC PANEL
BUN: 17 mg/dL (ref 6–23)
Calcium: 9.7 mg/dL (ref 8.4–10.5)
GFR calc non Af Amer: 54 mL/min — ABNORMAL LOW (ref >90)
Glucose, Bld: 95 mg/dL (ref 70–99)
Potassium: 4 mEq/L (ref 3.7–5.3)
Sodium: 143 mEq/L (ref 137–147)

## 2013-04-24 LAB — CBC
Hemoglobin: 11.9 g/dL — ABNORMAL LOW (ref 12.0–15.0)
MCH: 28.7 pg (ref 26.0–34.0)
MCHC: 33.4 g/dL (ref 30.0–36.0)
Platelets: 213 10*3/uL (ref 150–400)
RBC: 4.14 MIL/uL (ref 3.87–5.11)

## 2013-04-24 NOTE — Patient Instructions (Addendum)
20 Sherri Mcmillan  04/24/2013   Your procedure is scheduled on: 05/02/13  Report to [facility] at 6:00 AM.  Call this number if you have problems the morning of surgery [phone]: [phone]   Remember: please bring inhaler on day of surgery, please use fleets enema the night before surgery    Do not eat food or drink liquids After Midnight.     Take these medicines the morning of surgery with A SIP OF WATER: albuterol, amlodipine, celexa, protonix   Do not wear jewelry, make-up or nail polish.  Do not wear lotions, powders, or perfumes. You may wear deodorant.  Do not shave 48 hours prior to surgery. Men may shave face and neck.  Do not bring valuables to the hospital.  Contacts, dentures or bridgework may not be worn into surgery.  Leave suitcase in the car. After surgery it may be brought to your room.  For patients admitted to the hospital, checkout time is 11:00 AM the day of discharge.  Birdie Sons, RN  pre op nurse call if needed [phone]    FAILURE TO FOLLOW THESE INSTRUCTIONS MAY RESULT IN CANCELLATION OF YOUR SURGERY   Patient Signature: ___________________________________________

## 2013-04-24 NOTE — Progress Notes (Signed)
Chest x-ray 03/30/13 on EPIC, EKG 02/21/13 on EPIC

## 2013-05-01 ENCOUNTER — Encounter: Payer: Self-pay | Admitting: Internal Medicine

## 2013-05-01 NOTE — H&P (Signed)
Chief Complaint: Hiatal hernia with symptomatic gastroesophageal reflux  History of Present Illness: Sherri Mcmillan is an 80 y.o. female who presents having been worked up by [facility] with upper endoscopy and with upper GI for symptomatic reflux. Her main problems or mechanical reflux when she bends over and also reflux upper esophagus and out her nose at night. She is very much bothered by this and the hoarseness of fracture her ability to seen. She's been treated with PPIs with improvement acid but mechanical aspect is something that bothers her and she would like to have surgery to correct this.  I gave her a pamphlet on this explained the procedure to her and her friend who is from [place] with laparoscopic surgery since he had a kidney transplant. I described the operation some detail in terms of its risk and benefits the possibility of having had done open. She was went proceed with scheduling.  Past Medical History   Diagnosis  Date   .  Chronic pain      DJD knees, back, migranes, LLE varicose veins, and LLE neuropathy.   .  Aortic stenosis      Dx ECHO 2008 , mild and aymptomatic , needs ECHO q 3-5 yrs   .  Barrett's esophagus      Demonstrated on EGD 12/2010. EGD 09/17/2012 shows inflamed GE junctional mucosa without metaplasia, dysplasia, or malignancy.   .  Chronic insomnia    .  Chronic anxiety      Admission to [facility] (90s or early 2000) for 6 weeks after mother died. Complicated again by death of her sister 2010. 2012 developed hallucinations and I refused to refill controlled meds unless she see psych which she is not agreable to   .  Elevated cholesterol  10/11     LDL 155. Her 10 year risk (decreasing her age to 45 as the calculator won't go to age 84) is 21% ish. If consider her non smoker (which I wouldn't even though she says 1 pak lasts 2 weeks) risk is 12% ish. So goal for LDL is 130 or 100.   Marland Kitchen  HTN (hypertension)      Controlled with 2 drug therapy   .  Migraine    .  Cataract     .  Depression    .  Gastroparesis      Demonstrated on GES 12/2010 by Dr Dalene Seltzer   .  Bronchitis      hx of   .  Left leg DVT  09/28/2012   .  Anemia  12/14/2012    Past Surgical History   Procedure  Laterality  Date   .  Direct laryngoscopy   September 2008     preoperative diagnosis hoarseness with anterior right vocal cord lesion - direct laryngoscopy and excisional biopsy of right anterior vocal cord lesion done by Dr. Ezzard Standing   .  Meniscectomy   July 2002     preoperative diagnosis torn medial meniscus right knee, partial medial meniscectomy, debridement chondroplasty patellofemoral joint, done by Dr. Madelon Lips   .  Abdominal hysterectomy     .  Back surgery     .  Hemorrhoid surgery     .  Appendectomy     .  Cataract extraction       left eye   .  Polypectomy   2000     Dr.Magod   .  Colonoscopy   2000&2005     Dr.Magod   .  Esophagogastroduodenoscopy  11/2010   .  Knee arthroscopy     .  Eye surgery     .  Rotator cuff repair   09/21/2011     rt shoulder   .  Esophagogastroduodenoscopy  N/A  09/17/2012     Procedure: ESOPHAGOGASTRODUODENOSCOPY (EGD); Surgeon: Hart Carwinora M Brodie, MD; [facility]; Service: Endoscopy; Laterality: N/A;   .  Esophagogastroduodenoscopy  N/A  12/17/2012     Procedure: ESOPHAGOGASTRODUODENOSCOPY (EGD); Surgeon: Beverley FiedlerJay M Pyrtle, MD; [facility]; Service: Gastroenterology; Laterality: N/A;    Current Outpatient Prescriptions   Medication  Sig  Dispense  Refill   .  acetaminophen-codeine (TYLENOL #3) 300-30 MG per tablet  Take 1 tablet by mouth every 4 (four) hours as needed for pain.  45 tablet  0   .  amLODipine (NORVASC) 10 MG tablet      .  antiseptic oral rinse (BIOTENE) LIQD  15 mLs by Mouth Rinse route as needed.     .  citalopram (CELEXA) 40 MG tablet  Take 1 tablet (40 mg total) by mouth daily.  30 tablet  3   .  clonazePAM (KLONOPIN) 0.25 MG disintegrating tablet      .  docusate sodium (COLACE) 100 MG capsule  Take 1 capsule  (100 mg total) by mouth 2 (two) times daily as needed for constipation.  60 capsule  2   .  ferrous sulfate 325 (65 FE) MG tablet  Take 1 tablet (325 mg total) by mouth daily with breakfast.  90 tablet  2   .  furosemide (LASIX) 40 MG tablet  Take 1 tablet (40 mg total) by mouth daily.  30 tablet  2   .  HYDROcodone-acetaminophen (NORCO/VICODIN) 5-325 MG per tablet  Take 1 tablet by mouth every 8 (eight) hours as needed for pain.  40 tablet  0   .  KLOR-CON M10 10 MEQ tablet      .  metoCLOPramide (REGLAN) 5 MG tablet  Take 1 tablet (5 mg total) by mouth 3 (three) times daily before meals.  90 tablet  1   .  ondansetron (ZOFRAN ODT) 4 MG disintegrating tablet  4mg  ODT q4 hours prn nausea/vomit  10 tablet  0   .  ondansetron (ZOFRAN) 4 MG tablet  Take 1 tablet (4 mg total) by mouth daily as needed for nausea.  30 tablet  0   .  pantoprazole (PROTONIX) 40 MG tablet  Take 1 tablet (40 mg total) by mouth 2 (two) times daily.  60 tablet  3   .  potassium chloride (K-DUR) 10 MEQ tablet  Take 1 tablet (10 mEq total) by mouth daily.  30 tablet  0   .  sucralfate (CARAFATE) 1 G tablet  Take 1 tablet (1 g total) by mouth 4 (four) times daily.  120 tablet  4    No current facility-administered medications for this visit.   Milk-related compounds and Aspirin  Family History   Problem  Relation  Age of Onset   .  Stroke  Neg Hx    .  Cancer  Neg Hx    .  Colon cancer  Neg Hx    .  Anesthesia problems  Neg Hx    .  Hypotension  Neg Hx    .  Malignant hyperthermia  Neg Hx    .  Pseudochol deficiency  Neg Hx    .  Heart disease  Mother    .  Hypertension  Mother  Social History: reports that she quit smoking about 2 years ago. Her smoking use included Cigarettes. She smoked 0.00 packs per day. She has never used smokeless tobacco. She reports that she does not drink alcohol or use illicit drugs.  REVIEW OF SYSTEMS - PERTINENT POSITIVES ONLY:  history of DVT. Otherwise she reports she is healthy   Physical Exam:  Last menstrual period 04/26/1950.  There is no weight on file to calculate BMI.  Gen: WDWN African American female NAD  Neurological: Alert and oriented to person, place, and time. Motor and sensory function is grossly intact  Head: Normocephalic and atraumatic.  Eyes: Conjunctivae are normal. Pupils are equal, round, and reactive to light. No scleral icterus.  Neck: Normal range of motion. Neck supple. No tracheal deviation or thyromegaly present.  Cardiovascular: SR without murmurs or gallops. No carotid bruits  Respiratory: Effort normal. No respiratory distress. No chest wall tenderness. Breath sounds normal. No wheezes, rales or rhonchi.  Abdomen: Nontender. No masses or organomegaly noted.  GU:  Musculoskeletal: Normal range of motion. Extremities are nontender. No cyanosis, edema or clubbing noted Lymphadenopathy: No cervical, preauricular, postauricular or axillary adenopathy is present Skin: Skin is warm and dry. No rash noted. No diaphoresis. No erythema. No pallor. Pscyh: Normal mood and affect. Behavior is normal. Judgment and thought content normal.  LABORATORY RESULTS:  No results found for this or any previous visit (from the past 48 hour(s)).  RADIOLOGY RESULTS:  No results found.  Problem List:  Patient Active Problem List    Diagnosis  Date Noted   .  Hiatal hernia  02/19/2013   .  Hypokalemia  02/03/2013   .  Acute blood loss anemia  12/15/2012   .  Other specified anemias  12/14/2012   .  Anemia  12/14/2012   .  Spitting blood  12/12/2012   .  Anemia, unspecified  12/12/2012   .  Warfarin-induced coagulopathy  12/11/2012   .  Left leg DVT  10/05/2012   .  Stricture and stenosis of esophagus  09/17/2012   .  Reflux esophagitis  09/17/2012   .  Bicipital tendinitis of right shoulder  08/26/2011   .  Rotator cuff syndrome of right shoulder  08/09/2011   .  Barrett's esophagus  01/05/2011   .  Gastroparesis  01/05/2011   .  GERD (gastroesophageal  reflux disease)  08/03/2010   .  Chronic pain    .  Elevated cholesterol    .  Chronic anxiety    .  HTN (hypertension)    .  Aortic stenosis    .  INSOMNIA, PERSISTENT  07/27/2006   .  VENOUS INSUFFICIENCY  07/06/2006   Assessment & Plan:  Symptomatic hiatal hernia with gastroesophageal reflux disease. Plan laparoscopic hiatal hernia repair and Nissen fundoplication.  Matt B. Daphine Deutscher, MD, [facility]  [phone] beeper  [phone]

## 2013-05-02 ENCOUNTER — Encounter (HOSPITAL_COMMUNITY)
Admission: RE | Disposition: A | Discharge status: Home / Self Care | Payer: Self-pay | Source: Ambulatory Visit | Attending: Surgery

## 2013-05-02 ENCOUNTER — Inpatient Hospital Stay (HOSPITAL_COMMUNITY): Payer: Medicare HMO | Admitting: Registered Nurse

## 2013-05-02 ENCOUNTER — Encounter (HOSPITAL_COMMUNITY): Payer: Self-pay | Admitting: *Deleted

## 2013-05-02 ENCOUNTER — Inpatient Hospital Stay (HOSPITAL_COMMUNITY)
Admission: RE | Admit: 2013-05-02 | Discharge: 2013-05-10 | DRG: 328 | Disposition: A | Discharge status: Home / Self Care | Payer: Medicare HMO | Source: Ambulatory Visit | Attending: Surgery | Admitting: Surgery

## 2013-05-02 ENCOUNTER — Encounter (HOSPITAL_COMMUNITY): Payer: Medicare HMO | Admitting: Registered Nurse

## 2013-05-02 DIAGNOSIS — E7800 Pure hypercholesterolemia, unspecified: Secondary | ICD-10-CM | POA: Diagnosis present

## 2013-05-02 DIAGNOSIS — K449 Diaphragmatic hernia without obstruction or gangrene: Principal | ICD-10-CM | POA: Diagnosis present

## 2013-05-02 DIAGNOSIS — Z87891 Personal history of nicotine dependence: Secondary | ICD-10-CM

## 2013-05-02 DIAGNOSIS — Z01812 Encounter for preprocedural laboratory examination: Secondary | ICD-10-CM

## 2013-05-02 DIAGNOSIS — Z9089 Acquired absence of other organs: Secondary | ICD-10-CM

## 2013-05-02 DIAGNOSIS — F329 Major depressive disorder, single episode, unspecified: Secondary | ICD-10-CM | POA: Diagnosis present

## 2013-05-02 DIAGNOSIS — R1310 Dysphagia, unspecified: Secondary | ICD-10-CM | POA: Diagnosis present

## 2013-05-02 DIAGNOSIS — Z90710 Acquired absence of both cervix and uterus: Secondary | ICD-10-CM

## 2013-05-02 DIAGNOSIS — Z9889 Other specified postprocedural states: Secondary | ICD-10-CM | POA: Diagnosis present

## 2013-05-02 DIAGNOSIS — K219 Gastro-esophageal reflux disease without esophagitis: Secondary | ICD-10-CM | POA: Diagnosis present

## 2013-05-02 DIAGNOSIS — F411 Generalized anxiety disorder: Secondary | ICD-10-CM | POA: Diagnosis present

## 2013-05-02 DIAGNOSIS — I10 Essential (primary) hypertension: Secondary | ICD-10-CM | POA: Diagnosis present

## 2013-05-02 DIAGNOSIS — Z8249 Family history of ischemic heart disease and other diseases of the circulatory system: Secondary | ICD-10-CM

## 2013-05-02 DIAGNOSIS — F3289 Other specified depressive episodes: Secondary | ICD-10-CM | POA: Diagnosis present

## 2013-05-02 DIAGNOSIS — E876 Hypokalemia: Secondary | ICD-10-CM | POA: Diagnosis present

## 2013-05-02 HISTORY — PX: LAPAROSCOPIC NISSEN FUNDOPLICATION: SHX1932

## 2013-05-02 LAB — CBC
HCT: 34.2 % — ABNORMAL LOW (ref 36.0–46.0)
Hemoglobin: 11.3 g/dL — ABNORMAL LOW (ref 12.0–15.0)
MCH: 28.6 pg (ref 26.0–34.0)
MCHC: 33 g/dL (ref 30.0–36.0)
MCV: 86.6 fL (ref 78.0–100.0)
Platelets: 171 10*3/uL (ref 150–400)
RBC: 3.95 MIL/uL (ref 3.87–5.11)
RDW: 14.2 % (ref 11.5–15.5)
WBC: 9 10*3/uL (ref 4.0–10.5)

## 2013-05-02 LAB — CREATININE, SERUM
Creatinine, Ser: 0.84 mg/dL (ref 0.50–1.10)
GFR calc Af Amer: 75 mL/min — ABNORMAL LOW (ref >90)
GFR calc non Af Amer: 65 mL/min — ABNORMAL LOW (ref >90)

## 2013-05-02 SURGERY — FUNDOPLICATION, NISSEN, LAPAROSCOPIC
Anesthesia: General | Site: Abdomen

## 2013-05-02 MED ORDER — CEFAZOLIN SODIUM 1-5 GM-% IV SOLN
1.0000 g | Freq: Three times a day (TID) | INTRAVENOUS | Status: AC
  Administered 2013-05-02: 1 g via INTRAVENOUS
  Filled 2013-05-02: qty 50

## 2013-05-02 MED ORDER — CEFAZOLIN SODIUM-DEXTROSE 2-3 GM-% IV SOLR
INTRAVENOUS | Status: AC
  Filled 2013-05-02: qty 50

## 2013-05-02 MED ORDER — 0.9 % SODIUM CHLORIDE (POUR BTL) OPTIME
TOPICAL | Status: DC | PRN
  Administered 2013-05-02: 1000 mL

## 2013-05-02 MED ORDER — SODIUM CHLORIDE 0.9 % IJ SOLN
INTRAMUSCULAR | Status: AC
  Filled 2013-05-02: qty 20

## 2013-05-02 MED ORDER — LIP MEDEX EX OINT
TOPICAL_OINTMENT | CUTANEOUS | Status: AC
  Administered 2013-05-02: 15:00:00
  Filled 2013-05-02: qty 7

## 2013-05-02 MED ORDER — PROMETHAZINE HCL 25 MG/ML IJ SOLN: 6.2500 mg | INTRAMUSCULAR | Status: DC | PRN

## 2013-05-02 MED ORDER — SUFENTANIL CITRATE 50 MCG/ML IV SOLN
INTRAVENOUS | Status: DC | PRN
  Administered 2013-05-02: 10 ug via INTRAVENOUS
  Administered 2013-05-02 (×2): 15 ug via INTRAVENOUS
  Administered 2013-05-02: 10 ug via INTRAVENOUS
  Administered 2013-05-02 (×2): 5 ug via INTRAVENOUS

## 2013-05-02 MED ORDER — METOCLOPRAMIDE HCL 5 MG/ML IJ SOLN
INTRAMUSCULAR | Status: AC
  Filled 2013-05-02: qty 2

## 2013-05-02 MED ORDER — ONDANSETRON HCL 4 MG/2ML IJ SOLN
INTRAMUSCULAR | Status: AC
  Filled 2013-05-02: qty 2

## 2013-05-02 MED ORDER — LACTATED RINGERS IV SOLN: INTRAVENOUS | Status: DC | PRN

## 2013-05-02 MED ORDER — SUCCINYLCHOLINE CHLORIDE 20 MG/ML IJ SOLN
INTRAMUSCULAR | Status: DC | PRN
  Administered 2013-05-02: 100 mg via INTRAVENOUS

## 2013-05-02 MED ORDER — LIDOCAINE HCL (CARDIAC) 20 MG/ML IV SOLN
INTRAVENOUS | Status: DC | PRN
  Administered 2013-05-02: 50 mg via INTRAVENOUS

## 2013-05-02 MED ORDER — KCL IN DEXTROSE-NACL 20-5-0.45 MEQ/L-%-% IV SOLN
INTRAVENOUS | Status: DC
  Administered 2013-05-02 – 2013-05-04 (×6): via INTRAVENOUS
  Administered 2013-05-04: 100 mL/h via INTRAVENOUS
  Administered 2013-05-06 (×2): via INTRAVENOUS
  Administered 2013-05-07: 1000 mL via INTRAVENOUS
  Administered 2013-05-07 – 2013-05-09 (×3): via INTRAVENOUS
  Filled 2013-05-02 (×15): qty 1000

## 2013-05-02 MED ORDER — LACTATED RINGERS IR SOLN
Status: DC | PRN
  Administered 2013-05-02: 1000 mL

## 2013-05-02 MED ORDER — PROPOFOL 10 MG/ML IV BOLUS
INTRAVENOUS | Status: DC | PRN
  Administered 2013-05-02: 180 mg via INTRAVENOUS

## 2013-05-02 MED ORDER — ROCURONIUM BROMIDE 100 MG/10ML IV SOLN
INTRAVENOUS | Status: AC
  Filled 2013-05-02: qty 1

## 2013-05-02 MED ORDER — MIDAZOLAM HCL 5 MG/5ML IJ SOLN
INTRAMUSCULAR | Status: DC | PRN
  Administered 2013-05-02: 1 mg via INTRAVENOUS
  Administered 2013-05-02: .5 mg via INTRAVENOUS

## 2013-05-02 MED ORDER — LIDOCAINE HCL (CARDIAC) 20 MG/ML IV SOLN
INTRAVENOUS | Status: AC
  Filled 2013-05-02: qty 5

## 2013-05-02 MED ORDER — SUFENTANIL CITRATE 50 MCG/ML IV SOLN
INTRAVENOUS | Status: AC
  Filled 2013-05-02: qty 1

## 2013-05-02 MED ORDER — HEPARIN SODIUM (PORCINE) 5000 UNIT/ML IJ SOLN
5000.0000 [IU] | Freq: Three times a day (TID) | INTRAMUSCULAR | Status: DC
  Administered 2013-05-02 – 2013-05-10 (×24): 5000 [IU] via SUBCUTANEOUS
  Filled 2013-05-02 (×26): qty 1

## 2013-05-02 MED ORDER — DEXAMETHASONE SODIUM PHOSPHATE 10 MG/ML IJ SOLN
INTRAMUSCULAR | Status: DC | PRN
  Administered 2013-05-02: 10 mg via INTRAVENOUS

## 2013-05-02 MED ORDER — CEFAZOLIN SODIUM-DEXTROSE 2-3 GM-% IV SOLR
2.0000 g | INTRAVENOUS | Status: AC
  Administered 2013-05-02: 2 g via INTRAVENOUS

## 2013-05-02 MED ORDER — ONDANSETRON HCL 4 MG PO TABS
4.0000 mg | ORAL_TABLET | Freq: Four times a day (QID) | ORAL | Status: DC | PRN
Start: 2013-05-02 — End: 2013-05-06

## 2013-05-02 MED ORDER — GLYCOPYRROLATE 0.2 MG/ML IJ SOLN
INTRAMUSCULAR | Status: AC
  Filled 2013-05-02: qty 3

## 2013-05-02 MED ORDER — ALBUTEROL SULFATE HFA 108 (90 BASE) MCG/ACT IN AERS
INHALATION_SPRAY | RESPIRATORY_TRACT | Status: DC | PRN
  Administered 2013-05-02 (×2): 3 via RESPIRATORY_TRACT

## 2013-05-02 MED ORDER — HYDROMORPHONE HCL PF 1 MG/ML IJ SOLN
0.2500 mg | INTRAMUSCULAR | Status: DC | PRN
  Administered 2013-05-02 (×2): 0.5 mg via INTRAVENOUS

## 2013-05-02 MED ORDER — LACTATED RINGERS IV SOLN
INTRAVENOUS | Status: DC | PRN
  Administered 2013-05-02 (×2): via INTRAVENOUS

## 2013-05-02 MED ORDER — KCL IN DEXTROSE-NACL 20-5-0.45 MEQ/L-%-% IV SOLN
INTRAVENOUS | Status: AC
Start: 2013-05-02 — End: 2013-05-03
  Filled 2013-05-02: qty 1000

## 2013-05-02 MED ORDER — ONDANSETRON HCL 4 MG/2ML IJ SOLN
INTRAMUSCULAR | Status: DC | PRN
  Administered 2013-05-02: 4 mg via INTRAVENOUS

## 2013-05-02 MED ORDER — SUCCINYLCHOLINE CHLORIDE 20 MG/ML IJ SOLN
INTRAMUSCULAR | Status: AC
  Filled 2013-05-02: qty 1

## 2013-05-02 MED ORDER — HYDROMORPHONE HCL PF 1 MG/ML IJ SOLN
0.5000 mg | INTRAMUSCULAR | Status: DC | PRN
  Administered 2013-05-02 – 2013-05-04 (×14): 0.5 mg via INTRAVENOUS
  Filled 2013-05-02 (×14): qty 1

## 2013-05-02 MED ORDER — HYDROMORPHONE HCL PF 1 MG/ML IJ SOLN
INTRAMUSCULAR | Status: AC
  Filled 2013-05-02: qty 1

## 2013-05-02 MED ORDER — NEOSTIGMINE METHYLSULFATE 1 MG/ML IJ SOLN
INTRAMUSCULAR | Status: DC | PRN
  Administered 2013-05-02: 4 mg via INTRAVENOUS

## 2013-05-02 MED ORDER — ALBUTEROL SULFATE (2.5 MG/3ML) 0.083% IN NEBU: 2.5000 mg | INHALATION_SOLUTION | RESPIRATORY_TRACT | Status: DC | PRN

## 2013-05-02 MED ORDER — HEPARIN SODIUM (PORCINE) 5000 UNIT/ML IJ SOLN
5000.0000 [IU] | Freq: Once | INTRAMUSCULAR | Status: AC
  Administered 2013-05-02: 5000 [IU] via SUBCUTANEOUS
  Filled 2013-05-02: qty 1

## 2013-05-02 MED ORDER — DEXAMETHASONE SODIUM PHOSPHATE 10 MG/ML IJ SOLN
INTRAMUSCULAR | Status: AC
  Filled 2013-05-02: qty 1

## 2013-05-02 MED ORDER — METOCLOPRAMIDE HCL 5 MG/ML IJ SOLN
INTRAMUSCULAR | Status: DC | PRN
  Administered 2013-05-02: 10 mg via INTRAVENOUS

## 2013-05-02 MED ORDER — ROCURONIUM BROMIDE 100 MG/10ML IV SOLN
INTRAVENOUS | Status: DC | PRN
  Administered 2013-05-02: 20 mg via INTRAVENOUS
  Administered 2013-05-02: 50 mg via INTRAVENOUS

## 2013-05-02 MED ORDER — BUPIVACAINE LIPOSOME 1.3 % IJ SUSP
20.0000 mL | Freq: Once | INTRAMUSCULAR | Status: AC
  Administered 2013-05-02: 20 mL
  Filled 2013-05-02: qty 20

## 2013-05-02 MED ORDER — BUPIVACAINE-EPINEPHRINE PF 0.25-1:200000 % IJ SOLN
INTRAMUSCULAR | Status: AC
  Filled 2013-05-02: qty 30

## 2013-05-02 MED ORDER — PROPOFOL 10 MG/ML IV BOLUS
INTRAVENOUS | Status: AC
  Filled 2013-05-02: qty 20

## 2013-05-02 MED ORDER — MIDAZOLAM HCL 2 MG/2ML IJ SOLN
INTRAMUSCULAR | Status: AC
  Filled 2013-05-02: qty 2

## 2013-05-02 MED ORDER — ONDANSETRON HCL 4 MG/2ML IJ SOLN
4.0000 mg | Freq: Four times a day (QID) | INTRAMUSCULAR | Status: DC | PRN
  Administered 2013-05-02 – 2013-05-06 (×5): 4 mg via INTRAVENOUS
  Filled 2013-05-02 (×5): qty 2

## 2013-05-02 MED ORDER — LABETALOL HCL 5 MG/ML IV SOLN
INTRAVENOUS | Status: DC | PRN
  Administered 2013-05-02: 2.5 mg via INTRAVENOUS

## 2013-05-02 MED ORDER — GLYCOPYRROLATE 0.2 MG/ML IJ SOLN
INTRAMUSCULAR | Status: DC | PRN
  Administered 2013-05-02: 0.6 mg via INTRAVENOUS

## 2013-05-02 MED ORDER — ALBUTEROL SULFATE HFA 108 (90 BASE) MCG/ACT IN AERS: 2.0000 | INHALATION_SPRAY | RESPIRATORY_TRACT | Status: DC | PRN

## 2013-05-02 MED ORDER — NEOSTIGMINE METHYLSULFATE 1 MG/ML IJ SOLN
INTRAMUSCULAR | Status: AC
  Filled 2013-05-02: qty 10

## 2013-05-02 SURGICAL SUPPLY — 57 items
APL SKNCLS STERI-STRIP NONHPOA (GAUZE/BANDAGES/DRESSINGS)
APPLIER CLIP ROT 10 11.4 M/L (STAPLE)
APR CLP MED LRG 11.4X10 (STAPLE)
BENZOIN TINCTURE PRP APPL 2/3 (GAUZE/BANDAGES/DRESSINGS) ×1 IMPLANT
CABLE HIGH FREQUENCY MONO STRZ (ELECTRODE) ×1 IMPLANT
CANISTER SUCTION 2500CC (MISCELLANEOUS) ×1 IMPLANT
CLAMP ENDO BABCK 10MM (STAPLE) IMPLANT
CLIP APPLIE ROT 10 11.4 M/L (STAPLE) IMPLANT
COVER SURGICAL LIGHT HANDLE (MISCELLANEOUS) ×2 IMPLANT
DECANTER SPIKE VIAL GLASS SM (MISCELLANEOUS) ×1 IMPLANT
DEVICE SUT QUICK LOAD TK 5 (STAPLE) ×6 IMPLANT
DEVICE SUT TI-KNOT TK 5X26 (MISCELLANEOUS) ×1 IMPLANT
DEVICE SUTURE ENDOST 10MM (ENDOMECHANICALS) ×2 IMPLANT
DISSECTOR BLUNT TIP ENDO 5MM (MISCELLANEOUS) ×2 IMPLANT
DRAIN PENROSE 18X1/2 LTX STRL (DRAIN) ×2 IMPLANT
DRAPE LAPAROSCOPIC ABDOMINAL (DRAPES) ×2 IMPLANT
ELECT REM PT RETURN 9FT ADLT (ELECTROSURGICAL) ×2
ELECTRODE REM PT RTRN 9FT ADLT (ELECTROSURGICAL) ×1 IMPLANT
FILTER SMOKE EVAC LAPAROSHD (FILTER) IMPLANT
GLOVE BIOGEL M 8.0 STRL (GLOVE) ×2 IMPLANT
GLOVE BIOGEL PI IND STRL 7.0 (GLOVE) IMPLANT
GLOVE BIOGEL PI INDICATOR 7.0 (GLOVE)
GOWN STRL REIN XL XLG (GOWN DISPOSABLE) ×4 IMPLANT
GOWN STRL REUS W/TWL LRG LVL3 (GOWN DISPOSABLE) ×1 IMPLANT
GOWN STRL REUS W/TWL XL LVL3 (GOWN DISPOSABLE) ×2 IMPLANT
GRASPER ENDO BABCOCK 10 (MISCELLANEOUS) IMPLANT
GRASPER ENDO BABCOCK 10MM (MISCELLANEOUS)
KIT BASIN OR (CUSTOM PROCEDURE TRAY) ×2 IMPLANT
NS IRRIG 1000ML POUR BTL (IV SOLUTION) ×2 IMPLANT
PENCIL BUTTON HOLSTER BLD 10FT (ELECTRODE) ×1 IMPLANT
SCALPEL HARMONIC ACE (MISCELLANEOUS) ×2 IMPLANT
SCISSORS LAP 5X35 DISP (ENDOMECHANICALS) ×2 IMPLANT
SET IRRIG TUBING LAPAROSCOPIC (IRRIGATION / IRRIGATOR) ×2 IMPLANT
SLEEVE ADV FIXATION 5X100MM (TROCAR) IMPLANT
SLEEVE XCEL OPT CAN 5 100 (ENDOMECHANICALS) ×2 IMPLANT
SLEEVE Z-THREAD 5X100MM (TROCAR) IMPLANT
SOLUTION ANTI FOG 6CC (MISCELLANEOUS) ×2 IMPLANT
STAPLER VISISTAT 35W (STAPLE) ×2 IMPLANT
STRIP CLOSURE SKIN 1/2X4 (GAUZE/BANDAGES/DRESSINGS) IMPLANT
SUT DEVICE BRAIDED 0X39 (SUTURE) ×6 IMPLANT
SUT SURGIDAC NAB ES-9 0 48 120 (SUTURE) ×8 IMPLANT
SUT VIC AB 4-0 SH 18 (SUTURE) ×2 IMPLANT
SYR 30ML LL (SYRINGE) ×2 IMPLANT
TIP INNERVISION DETACH 40FR (MISCELLANEOUS) IMPLANT
TIP INNERVISION DETACH 50FR (MISCELLANEOUS) IMPLANT
TIP INNERVISION DETACH 56FR (MISCELLANEOUS) ×1 IMPLANT
TIPS INNERVISION DETACH 40FR (MISCELLANEOUS)
TRAY FOLEY CATH 14FRSI W/METER (CATHETERS) ×2 IMPLANT
TRAY LAP CHOLE (CUSTOM PROCEDURE TRAY) ×2 IMPLANT
TROCAR ADV FIXATION 11X100MM (TROCAR) IMPLANT
TROCAR ADV FIXATION 5X100MM (TROCAR) IMPLANT
TROCAR BLADELESS OPT 5 100 (ENDOMECHANICALS) ×2 IMPLANT
TROCAR XCEL 12X100 BLDLESS (ENDOMECHANICALS) ×1 IMPLANT
TROCAR XCEL BLUNT TIP 100MML (ENDOMECHANICALS) IMPLANT
TROCAR XCEL NON-BLD 11X100MML (ENDOMECHANICALS) ×2 IMPLANT
TROCAR XCEL UNIV SLVE 11M 100M (ENDOMECHANICALS) IMPLANT
TUBING FILTER THERMOFLATOR (ELECTROSURGICAL) ×2 IMPLANT

## 2013-05-02 NOTE — Anesthesia Preprocedure Evaluation (Addendum)
Anesthesia Evaluation  Patient identified by MRN, date of birth, ID band Patient awake    Reviewed: Allergy & Precautions, H&P , NPO status , Patient's Chart, lab work & pertinent test results  Airway Mallampati: II TM Distance: >3 FB Neck ROM: Full    Dental  (+) Missing   Pulmonary asthma , former smoker,  breath sounds clear to auscultation  Pulmonary exam normal       Cardiovascular hypertension, Pt. on medications + Valvular Problems/Murmurs AS Rhythm:Regular Rate:Normal + Systolic murmurs    Neuro/Psych negative neurological ROS  negative psych ROS   GI/Hepatic Neg liver ROS, GERD-  Poorly Controlled,  Endo/Other  negative endocrine ROS  Renal/GU negative Renal ROS  negative genitourinary   Musculoskeletal negative musculoskeletal ROS (+)   Abdominal   Peds negative pediatric ROS (+)  Hematology  (+) anemia ,   Anesthesia Other Findings   Reproductive/Obstetrics negative OB ROS                       Anesthesia Physical Anesthesia Plan  ASA: II  Anesthesia Plan: General   Post-op Pain Management:    Induction: Intravenous and Rapid sequence  Airway Management Planned: Oral ETT  Additional Equipment:   Intra-op Plan:   Post-operative Plan: Extubation in OR  Informed Consent: I have reviewed the patients History and Physical, chart, labs and discussed the procedure including the risks, benefits and alternatives for the proposed anesthesia with the patient or authorized representative who has indicated his/her understanding and acceptance.   Dental advisory given  Plan Discussed with: CRNA and Surgeon  Anesthesia Plan Comments:        Anesthesia Quick Evaluation

## 2013-05-02 NOTE — Op Note (Signed)
Surgeon: Wenda LowMatt , MD, FACS  Asst:  Carman Chingoug Blackman, MD, FACS  Anes:  general  Procedure: Laparoscopic repair of type III mixed hiatus hernia with 3 suture repair of diaphragm and Nissen wrap over a #56 Fr dilator  Diagnosis: Type III mixed hiatus hernia with GERD  Complications: none  EBL:   20 cc  Description of Procedure:  [facility] and given general anesthesia.  After prepping with PCMX and performing a timeout, the abdomen was entered through the left upper quadrant with a 5 mm optiview technique without difficulty.  The two paramedian incisions were from prior appendectomy and hysterectomy and didn't pose a problem.  Six ports were used including the GrantNathanson in the upper midline to retract the liver.   The defect in the hiatus was moderately large and we were able to reduce the stomach with gentle traction.    Dissection began on the right side and the hernia reflections were taken off the right crus and the dissection was carried anteriorally across the distal esophagus.  Next the short gastrics were taken down on the left side using the Harmonic scalpel.  The crura were reapproximated with 3 sutures posteriorally using the Endostitch and the ENDO 360.  Tyknots secured the closure.    The 56 dilator was passed completely into the stomach and the wrap performed with minimal tension and secured with 3 sutures of surtidek again secured with Ty Knots.  The wrap looked healthy and in good position and with the 56 in place the hiatus closure looked just right.    Port sites were injected with Exparel and closed with 4-0 vicryl and Dermabond.    Matt B. Daphine Deutscher, MD, [facility], [place] [phone]

## 2013-05-02 NOTE — Interval H&P Note (Signed)
History and Physical Interval Note:  05/02/2013 8:26 AM  Sherri Mcmillan  has presented today for surgery, with the diagnosis of symptomatic gerd and hiatal hernia  The various methods of treatment have been discussed with the patient and family. After consideration of risks, benefits and other options for treatment, the patient has consented to  Procedure(s): LAPAROSCOPIC NISSEN FUNDOPLICATION (N/A) as a surgical intervention .  The patient's history has been reviewed, patient examined, no change in status, stable for surgery.  I have reviewed the patient's chart and labs.  Questions were answered to the patient's satisfaction.     , B

## 2013-05-02 NOTE — Anesthesia Postprocedure Evaluation (Signed)
  Anesthesia Post-op Note  Patient: Sherri Mcmillan  Procedure(s) Performed: Procedure(s) (LRB): LAPAROSCOPIC NISSEN FUNDOPLICATION (N/A)  Patient Location: PACU  Anesthesia Type: General  Level of Consciousness: awake and alert   Airway and Oxygen Therapy: Patient Spontanous Breathing  Post-op Pain: mild  Post-op Assessment: Post-op Vital signs reviewed, Patient's Cardiovascular Status Stable, Respiratory Function Stable, Patent Airway and No signs of Nausea or vomiting  Last Vitals:  Filed Vitals:   05/02/13 1115  BP: 132/84  Pulse: 74  Temp:   Resp: 11    Post-op Vital Signs: stable   Complications: No apparent anesthesia complications

## 2013-05-02 NOTE — Transfer of Care (Signed)
Immediate Anesthesia Transfer of Care Note  Patient: Sherri ShieldsMattie Mcmillan  Procedure(s) Performed: Procedure(s): LAPAROSCOPIC NISSEN FUNDOPLICATION (N/A)  Patient Location: PACU  Anesthesia Type:General  Level of Consciousness: awake, alert  and patient cooperative  Airway & Oxygen Therapy: Patient Spontanous Breathing and Patient connected to face mask oxygen  Post-op Assessment: Report given to PACU RN, Post -op Vital signs reviewed and stable and Patient moving all extremities X 4  Post vital signs: stable  Complications: No apparent anesthesia complications

## 2013-05-03 ENCOUNTER — Encounter (HOSPITAL_COMMUNITY): Payer: Self-pay | Admitting: Surgery

## 2013-05-03 ENCOUNTER — Inpatient Hospital Stay (HOSPITAL_COMMUNITY): Payer: Medicare HMO

## 2013-05-03 LAB — CBC
HCT: 32.6 % — ABNORMAL LOW (ref 36.0–46.0)
HEMOGLOBIN: 10.6 g/dL — AB (ref 12.0–15.0)
MCH: 28.6 pg (ref 26.0–34.0)
MCHC: 32.5 g/dL (ref 30.0–36.0)
MCV: 87.9 fL (ref 78.0–100.0)
Platelets: 159 10*3/uL (ref 150–400)
RBC: 3.71 MIL/uL — ABNORMAL LOW (ref 3.87–5.11)
RDW: 14.4 % (ref 11.5–15.5)
WBC: 9.7 10*3/uL (ref 4.0–10.5)

## 2013-05-03 LAB — BASIC METABOLIC PANEL
BUN: 15 mg/dL (ref 6–23)
CO2: 25 meq/L (ref 19–32)
Calcium: 8.9 mg/dL (ref 8.4–10.5)
Chloride: 102 mEq/L (ref 96–112)
Creatinine, Ser: 0.89 mg/dL (ref 0.50–1.10)
GFR calc Af Amer: 70 mL/min — ABNORMAL LOW (ref >90)
GFR calc non Af Amer: 60 mL/min — ABNORMAL LOW (ref >90)
Glucose, Bld: 117 mg/dL — ABNORMAL HIGH (ref 70–99)
Potassium: 4 mEq/L (ref 3.7–5.3)
Sodium: 139 mEq/L (ref 137–147)

## 2013-05-03 MED ORDER — IOHEXOL 300 MG/ML  SOLN
50.0000 mL | Freq: Once | INTRAMUSCULAR | Status: AC | PRN
  Administered 2013-05-03: 50 mL via ORAL

## 2013-05-03 NOTE — Progress Notes (Signed)
Patient ID: Sherri Mcmillan, female   DOB: 1932/08/21, 80 y.o.   MRN: [id number] [facility] Surgery Progress Note:   1 Day Post-Op  Subjective: Mental status is clear.  Awaiting swallow Objective: Vital signs in last 24 hours: Temp:  [98.3 F (36.8 C)-99.1 F (37.3 C)] 98.9 F (37.2 C) (01/08 0534) Pulse Rate:  [65-79] 65 (01/08 0534) Resp:  [9-18] 18 (01/08 0534) BP: (110-172)/(66-96) 110/69 mmHg (01/08 0534) SpO2:  [94 %-100 %] 100 % (01/08 0534) Weight:  [138 lb (62.596 kg)] 138 lb (62.596 kg) (01/07 1300)  Intake/Output from previous day: 01/07 0701 - 01/08 0700 In: 3225 [I.V.:3225] Out: 1150 [Urine:1100; Blood:50] Intake/Output this shift:    Physical Exam: Work of breathing is normal.  Sore as expected.    Lab Results:  Results for orders placed during the hospital encounter of 05/02/13 (from the past 48 hour(s))  CBC     Status: Abnormal   Collection Time    05/02/13  1:47 PM      Result Value Range   WBC 9.0  4.0 - 10.5 K/uL   RBC 3.95  3.87 - 5.11 MIL/uL   Hemoglobin 11.3 (*) 12.0 - 15.0 g/dL   HCT 34.2 (*) 36.0 - 46.0 %   MCV 86.6  78.0 - 100.0 fL   MCH 28.6  26.0 - 34.0 pg   MCHC 33.0  30.0 - 36.0 g/dL   RDW 14.2  11.5 - 15.5 %   Platelets 171  150 - 400 K/uL  CREATININE, SERUM     Status: Abnormal   Collection Time    05/02/13  1:47 PM      Result Value Range   Creatinine, Ser 0.84  0.50 - 1.10 mg/dL   GFR calc non Af Amer 65 (*) >90 mL/min   GFR calc Af Amer 75 (*) >90 mL/min   Comment: (NOTE)     The eGFR has been calculated using the CKD EPI equation.     This calculation has not been validated in all clinical situations.     eGFR's persistently <90 mL/min signify possible Chronic Kidney     Disease.  CBC     Status: Abnormal   Collection Time    05/03/13  6:00 AM      Result Value Range   WBC 9.7  4.0 - 10.5 K/uL   RBC 3.71 (*) 3.87 - 5.11 MIL/uL   Hemoglobin 10.6 (*) 12.0 - 15.0 g/dL   HCT 32.6 (*) 36.0 - 46.0 %   MCV 87.9  78.0 - 100.0  fL   MCH 28.6  26.0 - 34.0 pg   MCHC 32.5  30.0 - 36.0 g/dL   RDW 14.4  11.5 - 15.5 %   Platelets 159  150 - 400 K/uL  BASIC METABOLIC PANEL     Status: Abnormal   Collection Time    05/03/13  6:00 AM      Result Value Range   Sodium 139  137 - 147 mEq/L   Potassium 4.0  3.7 - 5.3 mEq/L   Chloride 102  96 - 112 mEq/L   CO2 25  19 - 32 mEq/L   Glucose, Bld 117 (*) 70 - 99 mg/dL   BUN 15  6 - 23 mg/dL   Creatinine, Ser 0.89  0.50 - 1.10 mg/dL   Calcium 8.9  8.4 - 10.5 mg/dL   GFR calc non Af Amer 60 (*) >90 mL/min   GFR calc Af Amer 70 (*) >90 mL/min  Comment: (NOTE)     The eGFR has been calculated using the CKD EPI equation.     This calculation has not been validated in all clinical situations.     eGFR's persistently <90 mL/min signify possible Chronic Kidney     Disease.    Radiology/Results: No results found.  Anti-infectives: Anti-infectives   Start     Dose/Rate Route Frequency Ordered Stop   05/02/13 1600  ceFAZolin (ANCEF) IVPB 1 g/50 mL premix     1 g 100 mL/hr over 30 Minutes Intravenous 3 times per day 05/02/13 1312 05/02/13 1729   05/02/13 0700  ceFAZolin (ANCEF) IVPB 2 g/50 mL premix    Comments:  Dose decreased to 2g per P&T policy for weight < 211ZN.   2 g 100 mL/hr over 30 Minutes Intravenous On call to O.R. 05/02/13 0656 05/02/13 0832      Assessment/Plan: Problem List: Patient Active Problem List   Diagnosis Date Noted  . S/P Nissen fundoplication (without gastrostomy tube) procedure 05/02/2013  . Hiatal hernia 02/19/2013  . Hypokalemia 02/03/2013  . Acute blood loss anemia 12/15/2012  . Other specified anemias 12/14/2012  . Anemia 12/14/2012  . Spitting blood 12/12/2012  . Anemia, unspecified 12/12/2012  . Warfarin-induced coagulopathy 12/11/2012  . Left leg DVT 10/05/2012  . Stricture and stenosis of esophagus 09/17/2012  . Reflux esophagitis 09/17/2012  . Bicipital tendinitis of right shoulder 08/26/2011  . Rotator cuff syndrome of  right shoulder 08/09/2011  . Barrett's esophagus 01/05/2011  . GERD (gastroesophageal reflux disease) 08/03/2010  . Chronic pain   . Elevated cholesterol   . Chronic anxiety   . HTN (hypertension)   . Aortic stenosis   . INSOMNIA, PERSISTENT 07/27/2006  . VENOUS INSUFFICIENCY 07/06/2006    Await swallow before starting liquids  1 Day Post-Op    LOS: 1 day   Matt B. Hassell Done, MD, [facility] [phone] beeper [phone]  05/03/2013 8:23 AM

## 2013-05-04 LAB — BASIC METABOLIC PANEL
BUN: 9 mg/dL (ref 6–23)
CALCIUM: 9.2 mg/dL (ref 8.4–10.5)
CO2: 26 meq/L (ref 19–32)
CREATININE: 0.95 mg/dL (ref 0.50–1.10)
Chloride: 100 mEq/L (ref 96–112)
GFR calc Af Amer: 65 mL/min — ABNORMAL LOW (ref >90)
GFR, EST NON AFRICAN AMERICAN: 56 mL/min — AB (ref >90)
Glucose, Bld: 103 mg/dL — ABNORMAL HIGH (ref 70–99)
Potassium: 4.1 mEq/L (ref 3.7–5.3)
Sodium: 137 mEq/L (ref 137–147)

## 2013-05-04 LAB — CBC
HCT: 32.8 % — ABNORMAL LOW (ref 36.0–46.0)
Hemoglobin: 10.9 g/dL — ABNORMAL LOW (ref 12.0–15.0)
MCH: 28.8 pg (ref 26.0–34.0)
MCHC: 33.2 g/dL (ref 30.0–36.0)
MCV: 86.8 fL (ref 78.0–100.0)
PLATELETS: 181 10*3/uL (ref 150–400)
RBC: 3.78 MIL/uL — ABNORMAL LOW (ref 3.87–5.11)
RDW: 14.5 % (ref 11.5–15.5)
WBC: 5.6 10*3/uL (ref 4.0–10.5)

## 2013-05-04 MED ORDER — KETOROLAC TROMETHAMINE 15 MG/ML IJ SOLN
7.5000 mg | Freq: Four times a day (QID) | INTRAMUSCULAR | Status: AC | PRN
Start: 2013-05-04 — End: 2013-05-05
  Administered 2013-05-04 – 2013-05-05 (×2): 7.5 mg via INTRAVENOUS
  Filled 2013-05-04 (×2): qty 1

## 2013-05-04 MED ORDER — HYDROMORPHONE HCL PF 1 MG/ML IJ SOLN
0.5000 mg | INTRAMUSCULAR | Status: DC | PRN
  Administered 2013-05-04 – 2013-05-05 (×5): 0.5 mg via INTRAVENOUS
  Filled 2013-05-04 (×5): qty 1

## 2013-05-04 NOTE — Progress Notes (Signed)
Patient ID: Sherri Mcmillan, female   DOB: 07/11/1932, 80 y.o.   MRN: [id number] [facility] Surgery Progress Note:   2 Days Post-Op  Subjective: Mental status is clear.   Objective: Vital signs in last 24 hours: Temp:  [98.3 F (36.8 C)-99.7 F (37.6 C)] 98.6 F (37 C) (01/09 0514) Pulse Rate:  [63-71] 71 (01/09 0514) Resp:  [18] 18 (01/09 0514) BP: (109-171)/(68-89) 142/72 mmHg (01/09 0612) SpO2:  [92 %-100 %] 96 % (01/09 0514)  Intake/Output from previous day: 01/08 0701 - 01/09 0700 In: 2520 [P.O.:120; I.V.:2400] Out: 2200 [Urine:2200] Intake/Output this shift:    Physical Exam: Work of breathing is normal.  Sore especially with coughing.    Lab Results:  Results for orders placed during the hospital encounter of 05/02/13 (from the past 48 hour(s))  CBC     Status: Abnormal   Collection Time    05/02/13  1:47 PM      Result Value Range   WBC 9.0  4.0 - 10.5 K/uL   RBC 3.95  3.87 - 5.11 MIL/uL   Hemoglobin 11.3 (*) 12.0 - 15.0 g/dL   HCT 34.2 (*) 36.0 - 46.0 %   MCV 86.6  78.0 - 100.0 fL   MCH 28.6  26.0 - 34.0 pg   MCHC 33.0  30.0 - 36.0 g/dL   RDW 14.2  11.5 - 15.5 %   Platelets 171  150 - 400 K/uL  CREATININE, SERUM     Status: Abnormal   Collection Time    05/02/13  1:47 PM      Result Value Range   Creatinine, Ser 0.84  0.50 - 1.10 mg/dL   GFR calc non Af Amer 65 (*) >90 mL/min   GFR calc Af Amer 75 (*) >90 mL/min   Comment: (NOTE)     The eGFR has been calculated using the CKD EPI equation.     This calculation has not been validated in all clinical situations.     eGFR's persistently <90 mL/min signify possible Chronic Kidney     Disease.  CBC     Status: Abnormal   Collection Time    05/03/13  6:00 AM      Result Value Range   WBC 9.7  4.0 - 10.5 K/uL   RBC 3.71 (*) 3.87 - 5.11 MIL/uL   Hemoglobin 10.6 (*) 12.0 - 15.0 g/dL   HCT 32.6 (*) 36.0 - 46.0 %   MCV 87.9  78.0 - 100.0 fL   MCH 28.6  26.0 - 34.0 pg   MCHC 32.5  30.0 - 36.0 g/dL   RDW  14.4  11.5 - 15.5 %   Platelets 159  150 - 400 K/uL  BASIC METABOLIC PANEL     Status: Abnormal   Collection Time    05/03/13  6:00 AM      Result Value Range   Sodium 139  137 - 147 mEq/L   Potassium 4.0  3.7 - 5.3 mEq/L   Chloride 102  96 - 112 mEq/L   CO2 25  19 - 32 mEq/L   Glucose, Bld 117 (*) 70 - 99 mg/dL   BUN 15  6 - 23 mg/dL   Creatinine, Ser 0.89  0.50 - 1.10 mg/dL   Calcium 8.9  8.4 - 10.5 mg/dL   GFR calc non Af Amer 60 (*) >90 mL/min   GFR calc Af Amer 70 (*) >90 mL/min   Comment: (NOTE)     The eGFR has been  calculated using the CKD EPI equation.     This calculation has not been validated in all clinical situations.     eGFR's persistently <90 mL/min signify possible Chronic Kidney     Disease.  CBC     Status: Abnormal   Collection Time    05/04/13  4:29 AM      Result Value Range   WBC 5.6  4.0 - 10.5 K/uL   RBC 3.78 (*) 3.87 - 5.11 MIL/uL   Hemoglobin 10.9 (*) 12.0 - 15.0 g/dL   HCT 32.8 (*) 36.0 - 46.0 %   MCV 86.8  78.0 - 100.0 fL   MCH 28.8  26.0 - 34.0 pg   MCHC 33.2  30.0 - 36.0 g/dL   RDW 14.5  11.5 - 15.5 %   Platelets 181  150 - 400 K/uL  BASIC METABOLIC PANEL     Status: Abnormal   Collection Time    05/04/13  4:29 AM      Result Value Range   Sodium 137  137 - 147 mEq/L   Potassium 4.1  3.7 - 5.3 mEq/L   Chloride 100  96 - 112 mEq/L   CO2 26  19 - 32 mEq/L   Glucose, Bld 103 (*) 70 - 99 mg/dL   BUN 9  6 - 23 mg/dL   Creatinine, Ser 0.95  0.50 - 1.10 mg/dL   Calcium 9.2  8.4 - 10.5 mg/dL   GFR calc non Af Amer 56 (*) >90 mL/min   GFR calc Af Amer 65 (*) >90 mL/min   Comment: (NOTE)     The eGFR has been calculated using the CKD EPI equation.     This calculation has not been validated in all clinical situations.     eGFR's persistently <90 mL/min signify possible Chronic Kidney     Disease.    Radiology/Results: Dg Ugi W/water Sol Cm  05/03/2013   CLINICAL DATA:  Status post fundoplication wrap  EXAM: WATER SOLUBLE UPPER GI SERIES   TECHNIQUE: Single-column upper GI series was performed using water soluble contrast.  CONTRAST:  19m OMNIPAQUE IOHEXOL 300 MG/ML  SOLN  COMPARISON:  02/21/2013  FLUOROSCOPY TIME:  26 seconds  FINDINGS: The patient ingested 50 cc of Omni 300 water-soluble contrast material. Postoperative change from fundoplication wrap noted. The wrapped segment of the proximal stomach appears patent. There is no extravasation of contrast material.  IMPRESSION: 1. No complicating features status post fundoplication wrap. The fundoplication wrap appears intact and there is no extravasation of contrast material.   Electronically Signed   By: TKerby MoorsM.D.   On: 05/03/2013 10:06    Anti-infectives: Anti-infectives   Start     Dose/Rate Route Frequency Ordered Stop   05/02/13 1600  ceFAZolin (ANCEF) IVPB 1 g/50 mL premix     1 g 100 mL/hr over 30 Minutes Intravenous 3 times per day 05/02/13 1312 05/02/13 1729   05/02/13 0700  ceFAZolin (ANCEF) IVPB 2 g/50 mL premix    Comments:  Dose decreased to 2g per P&T policy for weight < 1664QI   2 g 100 mL/hr over 30 Minutes Intravenous On call to O.R. 05/02/13 0656 05/02/13 0832      Assessment/Plan: Problem List: Patient Active Problem List   Diagnosis Date Noted  . S/P Nissen fundoplication (without gastrostomy tube) procedure 05/02/2013  . Hiatal hernia 02/19/2013  . Hypokalemia 02/03/2013  . Acute blood loss anemia 12/15/2012  . Other specified anemias 12/14/2012  . Anemia 12/14/2012  .  Spitting blood 12/12/2012  . Anemia, unspecified 12/12/2012  . Warfarin-induced coagulopathy 12/11/2012  . Left leg DVT 10/05/2012  . Stricture and stenosis of esophagus 09/17/2012  . Reflux esophagitis 09/17/2012  . Bicipital tendinitis of right shoulder 08/26/2011  . Rotator cuff syndrome of right shoulder 08/09/2011  . Barrett's esophagus 01/05/2011  . GERD (gastroesophageal reflux disease) 08/03/2010  . Chronic pain   . Elevated cholesterol   . Chronic anxiety    . HTN (hypertension)   . Aortic stenosis   . INSOMNIA, PERSISTENT 07/27/2006  . VENOUS INSUFFICIENCY 07/06/2006    Will give a few doses of low dose Toradol to supplement Dilaudid.  VSS otherwise.   2 Days Post-Op    LOS: 2 days   Matt B. Hassell Done, MD, [facility] [phone] beeper [phone]  05/04/2013 7:58 AM

## 2013-05-05 ENCOUNTER — Other Ambulatory Visit: Payer: Self-pay | Admitting: Internal Medicine

## 2013-05-05 MED ORDER — HYDROMORPHONE HCL PF 1 MG/ML IJ SOLN
0.5000 mg | INTRAMUSCULAR | Status: DC | PRN
  Administered 2013-05-05 – 2013-05-06 (×5): 0.5 mg via INTRAVENOUS
  Filled 2013-05-05 (×5): qty 1

## 2013-05-05 MED ORDER — OXYCODONE-ACETAMINOPHEN 5-325 MG PO TABS
1.0000 | ORAL_TABLET | ORAL | Status: DC | PRN
  Administered 2013-05-06 – 2013-05-09 (×10): 2 via ORAL
  Filled 2013-05-05 (×10): qty 2

## 2013-05-05 NOTE — Progress Notes (Signed)
3 Days Post-Op  Subjective: Still very sore and requiring IV pain medicines.  Taking small amounts of clear liquid.  Walking.  Objective: Vital signs in last 24 hours: Temp:  [97.8 F (36.6 C)-98.1 F (36.7 C)] 97.8 F (36.6 C) (01/10 0530) Pulse Rate:  [57-97] 57 (01/10 0530) Resp:  [16-18] 18 (01/10 0530) BP: (131-165)/(79-82) 152/79 mmHg (01/10 0530) SpO2:  [96 %-98 %] 98 % (01/10 0530) Last BM Date: 05/01/13  Intake/Output from previous day: 01/09 0701 - 01/10 0700 In: 1680 [P.O.:480; I.V.:1200] Out: 1650 [Urine:1650] Intake/Output this shift:    PE: General- In NAD Abdomen-soft, tender around incisions, incisions clean and intact  Lab Results:   Recent Labs  05/03/13 0600 05/04/13 0429  WBC 9.7 5.6  HGB 10.6* 10.9*  HCT 32.6* 32.8*  PLT 159 181   BMET  Recent Labs  05/03/13 0600 05/04/13 0429  NA 139 137  K 4.0 4.1  CL 102 100  CO2 25 26  GLUCOSE 117* 103*  BUN 15 9  CREATININE 0.89 0.95  CALCIUM 8.9 9.2   PT/INR No results found for this basename: LABPROT, INR,  in the last 72 hours Comprehensive Metabolic Panel:    Component Value Date/Time   NA 137 05/04/2013 0429   K 4.1 05/04/2013 0429   CL 100 05/04/2013 0429   CO2 26 05/04/2013 0429   BUN 9 05/04/2013 0429   CREATININE 0.95 05/04/2013 0429   CREATININE 0.92 02/01/2013 1510   GLUCOSE 103* 05/04/2013 0429   CALCIUM 9.2 05/04/2013 0429   AST 19 02/21/2013 2140   ALT 19 02/21/2013 2140   ALKPHOS 74 02/21/2013 2140   BILITOT 0.2* 02/21/2013 2140   PROT 8.1 02/21/2013 2140   ALBUMIN 4.3 02/21/2013 2140     Studies/Results: Dg Ugi W/water Sol Cm  05/03/2013   CLINICAL DATA:  Status post fundoplication wrap  EXAM: WATER SOLUBLE UPPER GI SERIES  TECHNIQUE: Single-column upper GI series was performed using water soluble contrast.  CONTRAST:  50mL OMNIPAQUE IOHEXOL 300 MG/ML  SOLN  COMPARISON:  02/21/2013  FLUOROSCOPY TIME:  26 seconds  FINDINGS: The patient ingested 50 cc of Omni 300 water-soluble  contrast material. Postoperative change from fundoplication wrap noted. The wrapped segment of the proximal stomach appears patent. There is no extravasation of contrast material.  IMPRESSION: 1. No complicating features status post fundoplication wrap. The fundoplication wrap appears intact and there is no extravasation of contrast material.   Electronically Signed   By: Signa Kellaylor  Stroud M.D.   On: 05/03/2013 10:06    Anti-infectives: Anti-infectives   Start     Dose/Rate Route Frequency Ordered Stop   05/02/13 1600  ceFAZolin (ANCEF) IVPB 1 g/50 mL premix     1 g 100 mL/hr over 30 Minutes Intravenous 3 times per day 05/02/13 1312 05/02/13 1729   05/02/13 0700  ceFAZolin (ANCEF) IVPB 2 g/50 mL premix    Comments:  Dose decreased to 2g per P&T policy for weight < 120kg.   2 g 100 mL/hr over 30 Minutes Intravenous On call to O.R. 05/02/13 [id number] 05/02/13 [id number]      Assessment Active Problems:   S/P Nissen fundoplication (without gastrostomy tube) procedure-still having some issues with pain management; taking clear liquids okay.    LOS: 3 days   Plan: Start Percocet.   Advance to full liquids.   , J 05/05/2013

## 2013-05-06 ENCOUNTER — Inpatient Hospital Stay (HOSPITAL_COMMUNITY): Payer: Medicare HMO

## 2013-05-06 MED ORDER — ONDANSETRON HCL 4 MG PO TABS
4.0000 mg | ORAL_TABLET | Freq: Four times a day (QID) | ORAL | Status: DC | PRN
Start: 2013-05-06 — End: 2013-05-10

## 2013-05-06 MED ORDER — ONDANSETRON HCL 4 MG/2ML IJ SOLN
4.0000 mg | INTRAMUSCULAR | Status: DC | PRN
  Administered 2013-05-07 – 2013-05-08 (×3): 4 mg via INTRAVENOUS
  Filled 2013-05-06 (×3): qty 2

## 2013-05-06 MED ORDER — ONDANSETRON HCL 4 MG/2ML IJ SOLN
INTRAMUSCULAR | Status: AC
  Administered 2013-05-06: 4 mg
  Filled 2013-05-06: qty 2

## 2013-05-06 NOTE — Progress Notes (Signed)
4 Days Post-Op  Subjective: Having problems with some nausea and wretching this morning.  Objective: Vital signs in last 24 hours: Temp:  [97.9 F (36.6 C)-98.9 F (37.2 C)] 97.9 F (36.6 C) (01/11 0530) Pulse Rate:  [56-64] 64 (01/11 0530) Resp:  [18] 18 (01/11 0530) BP: (141-171)/(66-86) 171/86 mmHg (01/11 0530) SpO2:  [98 %] 98 % (01/11 0530) Last BM Date: 05/01/13  Intake/Output from previous day: 01/10 0701 - 01/11 0700 In: 3460.4 [I.V.:3460.4] Out: 1250 [Urine:1250] Intake/Output this shift:    PE: General- In NAD Abdomen-soft, incisions clean and intact  Lab Results:   Recent Labs  05/04/13 0429  WBC 5.6  HGB 10.9*  HCT 32.8*  PLT 181   BMET  Recent Labs  05/04/13 0429  NA 137  K 4.1  CL 100  CO2 26  GLUCOSE 103*  BUN 9  CREATININE 0.95  CALCIUM 9.2   PT/INR No results found for this basename: LABPROT, INR,  in the last 72 hours Comprehensive Metabolic Panel:    Component Value Date/Time   NA 137 05/04/2013 0429   K 4.1 05/04/2013 0429   CL 100 05/04/2013 0429   CO2 26 05/04/2013 0429   BUN 9 05/04/2013 0429   CREATININE 0.95 05/04/2013 0429   CREATININE 0.92 02/01/2013 1510   GLUCOSE 103* 05/04/2013 0429   CALCIUM 9.2 05/04/2013 0429   AST 19 02/21/2013 2140   ALT 19 02/21/2013 2140   ALKPHOS 74 02/21/2013 2140   BILITOT 0.2* 02/21/2013 2140   PROT 8.1 02/21/2013 2140   ALBUMIN 4.3 02/21/2013 2140     Studies/Results: No results found.  Anti-infectives: Anti-infectives   Start     Dose/Rate Route Frequency Ordered Stop   05/02/13 1600  ceFAZolin (ANCEF) IVPB 1 g/50 mL premix     1 g 100 mL/hr over 30 Minutes Intravenous 3 times per day 05/02/13 1312 05/02/13 1729   05/02/13 0700  ceFAZolin (ANCEF) IVPB 2 g/50 mL premix    Comments:  Dose decreased to 2g per P&T policy for weight < 120kg.   2 g 100 mL/hr over 30 Minutes Intravenous On call to O.R. 05/02/13 [id number] 05/02/13 [id number]      Assessment Active Problems:   S/P Nissen fundoplication  (without gastrostomy tube) procedure-did not tolerate full liquids well.    LOS: 4 days   Plan: Back to clear liquids today.   , J 05/06/2013

## 2013-05-07 NOTE — Progress Notes (Signed)
Patient ID: Sherri Mcmillan, female   DOB: 1932-08-25, 80 y.o.   MRN: [id number] [facility]:   5 Days Post-Op  Subjective: Mental status is clear.  Feeling better Objective: Vital signs in last 24 hours: Temp:  [97.7 F (36.5 C)-98.5 F (36.9 C)] 97.7 F (36.5 C) (01/12 0545) Pulse Rate:  [58-97] 58 (01/12 0545) Resp:  [18] 18 (01/12 0545) BP: (117-164)/(59-73) 164/59 mmHg (01/12 0545) SpO2:  [95 %-99 %] 99 % (01/12 0545)  Intake/Output from previous day: 01/11 0701 - 01/12 0700 In: 2280 [P.O.:480; I.V.:1800] Out: 500 [Urine:500] Intake/Output this shift:    Physical Exam: Work of breathing is normal.  nontender  Lab Results:  No results found for this or any previous visit (from the past 48 hour(s)).  Radiology/Results: Dg Ugi W/water Sol Cm  05/06/2013   CLINICAL DATA:  Status post Nissen fundoplication. Persistent epigastric pain a nausea  EXAM: WATER SOLUBLE UPPER GI SERIES  TECHNIQUE: Single-column upper GI series was performed using water soluble contrast.  CONTRAST:  50 cc Omnipaque  COMPARISON:  05/03/2012  FLUOROSCOPY TIME:  34 seconds  FINDINGS: Scout view of the abdomen shows contrast material throughout the colon from recent upper GI study. There is moderate gaseous distension of the stomach.  Again noted status post Nissen fundoplication. There is prompt passing of contrast from distal esophagus into the stomach. There is no evidence of disruption of fundoplication wrap. No evidence of contrast extravasation. Contrast material is noted leaving the stomach advancing in proximal duodenum. No evidence of gastric outlet obstruction.  IMPRESSION: Again noted status post Nissen fundoplication without evidence of obstruction or contrast extravasation. The fundoplication wrap appears intact. Moderate gas within stomach.   Electronically Signed   By: Natasha MeadLiviu  Pop M.D.   On: 05/06/2013 12:26    Anti-infectives: Anti-infectives   Start     Dose/Rate Route  Frequency Ordered Stop   05/02/13 1600  ceFAZolin (ANCEF) IVPB 1 g/50 mL premix     1 g 100 mL/hr over 30 Minutes Intravenous 3 times per day 05/02/13 1312 05/02/13 1729   05/02/13 0700  ceFAZolin (ANCEF) IVPB 2 g/50 mL premix    Comments:  Dose decreased to 2g per P&T policy for weight < 120kg.   2 g 100 mL/hr over 30 Minutes Intravenous On call to O.R. 05/02/13 0656 05/02/13 0832      Assessment/Plan: Problem List: Patient Active Problem List   Diagnosis Date Noted  . S/P Nissen fundoplication (without gastrostomy tube) procedure 05/02/2013  . Hiatal hernia 02/19/2013  . Hypokalemia 02/03/2013  . Acute blood loss anemia 12/15/2012  . Other specified anemias 12/14/2012  . Anemia 12/14/2012  . Spitting blood 12/12/2012  . Anemia, unspecified 12/12/2012  . Warfarin-induced coagulopathy 12/11/2012  . Left leg DVT 10/05/2012  . Stricture and stenosis of esophagus 09/17/2012  . Reflux esophagitis 09/17/2012  . Bicipital tendinitis of right shoulder 08/26/2011  . Rotator cuff syndrome of right shoulder 08/09/2011  . Barrett's esophagus 01/05/2011  . GERD (gastroesophageal reflux disease) 08/03/2010  . Chronic pain   . Elevated cholesterol   . Chronic anxiety   . HTN (hypertension)   . Aortic stenosis   . INSOMNIA, PERSISTENT 07/27/2006  . VENOUS INSUFFICIENCY 07/06/2006    Will try full liquids again.  Slowly progressing 5 Days Post-Op    LOS: 5 days   Matt B. Daphine DeutscherMartin, MD, [facility]  [facility] [phone] beeper [phone]  05/07/2013 8:22 AM

## 2013-05-07 NOTE — Care Management Note (Signed)
    Page 1 of 1   05/07/2013     11:40:09 AM   CARE MANAGEMENT NOTE 05/07/2013  Patient:  Sherri Mcmillan,Sherri Mcmillan   Account Number:  [id number]  Date Initiated:  05/07/2013  Documentation initiated by:  Lorenda IshiharaPEELE,  Subjective/Objective Assessment:   80 yo female admitted s/p lap nissen fundoplication. PTA lived at home alone.     Action/Plan:   Home when stable   Anticipated DC Date:  05/09/2013   Anticipated DC Plan:  HOME/SELF CARE      DC Planning Services  CM consult      Choice offered to / List presented to:             Status of service:  Completed, signed off Medicare Important Message given?   (If response is "NO", the following Medicare IM given date fields will be blank) Date Medicare IM given:   Date Additional Medicare IM given:    Discharge Disposition:  HOME/SELF CARE  Per UR Regulation:  Reviewed for med. necessity/level of care/duration of stay  If discussed at Long Length of Stay Meetings, dates discussed:    Comments:

## 2013-05-08 NOTE — Plan of Care (Signed)
Problem: Phase II Progression Outcomes Goal: Tolerating diet Outcome: Progressing Had Full Liquid diet on 1/12- still with mild nausea but no vomiting

## 2013-05-08 NOTE — Plan of Care (Signed)
Problem: Phase II Progression Outcomes Goal: Return of bowel function (flatus, BM) IF ABDOMINAL SURGERY:  Outcome: Progressing +flatus, LBM last Tuesday

## 2013-05-08 NOTE — Progress Notes (Signed)
Patient ID: Sherri Mcmillan, female   DOB: Nov 01, 1932, 80 y.o.   MRN: [id number] [facility] Progress Note:   6 Days Post-Op  Subjective: Mental status is clear.  Feeling better and tolerating liquids Objective: Vital signs in last 24 hours: Temp:  [98 F (36.7 C)-98.7 F (37.1 C)] 98 F (36.7 C) (01/13 0535) Pulse Rate:  [54-70] 70 (01/13 0535) Resp:  [16-18] 18 (01/13 0535) BP: (121-150)/(66-85) 126/85 mmHg (01/13 0535) SpO2:  [93 %-100 %] 99 % (01/13 0535)  Intake/Output from previous day: 01/12 0701 - 01/13 0700 In: 2254.2 [P.O.:1000; I.V.:1254.2] Out: 950 [Urine:950] Intake/Output this shift: Total I/O In: 342.5 [P.O.:120; I.V.:222.5] Out: 300 [Urine:300]  Physical Exam: Work of breathing is normal.  No abdominal complaints  Lab Results:  No results found for this or any previous visit (from the past 48 hour(s)).  Radiology/Results: Dg Ugi W/water Sol Cm  05/06/2013   CLINICAL DATA:  Status post Nissen fundoplication. Persistent epigastric pain a nausea  EXAM: WATER SOLUBLE UPPER GI SERIES  TECHNIQUE: Single-column upper GI series was performed using water soluble contrast.  CONTRAST:  50 cc Omnipaque  COMPARISON:  05/03/2012  FLUOROSCOPY TIME:  34 seconds  FINDINGS: Scout view of the abdomen shows contrast material throughout the colon from recent upper GI study. There is moderate gaseous distension of the stomach.  Again noted status post Nissen fundoplication. There is prompt passing of contrast from distal esophagus into the stomach. There is no evidence of disruption of fundoplication wrap. No evidence of contrast extravasation. Contrast material is noted leaving the stomach advancing in proximal duodenum. No evidence of gastric outlet obstruction.  IMPRESSION: Again noted status post Nissen fundoplication without evidence of obstruction or contrast extravasation. The fundoplication wrap appears intact. Moderate gas within stomach.   Electronically Signed   By: Natasha Mead M.D.   On: 05/06/2013 12:26    Anti-infectives: Anti-infectives   Start     Dose/Rate Route Frequency Ordered Stop   05/02/13 1600  ceFAZolin (ANCEF) IVPB 1 g/50 mL premix     1 g 100 mL/hr over 30 Minutes Intravenous 3 times per day 05/02/13 1312 05/02/13 1729   05/02/13 0700  ceFAZolin (ANCEF) IVPB 2 g/50 mL premix    Comments:  Dose decreased to 2g per P&T policy for weight < 120kg.   2 g 100 mL/hr over 30 Minutes Intravenous On call to O.R. 05/02/13 0656 05/02/13 0832      Assessment/Plan: Problem List: Patient Active Problem List   Diagnosis Date Noted  . S/P Nissen fundoplication (without gastrostomy tube) procedure 05/02/2013  . Hiatal hernia 02/19/2013  . Hypokalemia 02/03/2013  . Acute blood loss anemia 12/15/2012  . Other specified anemias 12/14/2012  . Anemia 12/14/2012  . Spitting blood 12/12/2012  . Anemia, unspecified 12/12/2012  . Warfarin-induced coagulopathy 12/11/2012  . Left leg DVT 10/05/2012  . Stricture and stenosis of esophagus 09/17/2012  . Reflux esophagitis 09/17/2012  . Bicipital tendinitis of right shoulder 08/26/2011  . Rotator cuff syndrome of right shoulder 08/09/2011  . Barrett's esophagus 01/05/2011  . GERD (gastroesophageal reflux disease) 08/03/2010  . Chronic pain   . Elevated cholesterol   . Chronic anxiety   . HTN (hypertension)   . Aortic stenosis   . INSOMNIA, PERSISTENT 07/27/2006  . VENOUS INSUFFICIENCY 07/06/2006    Will try to restart and advance diet.    6 Days Post-Op    LOS: 6 days   Matt B. Daphine Deutscher, MD, [facility] [phone] beeper [phone]  05/08/2013 11:18 AM

## 2013-05-09 MED ORDER — HYDROCODONE-ACETAMINOPHEN 7.5-325 MG/15ML PO SOLN
10.0000 mL | ORAL | Status: DC | PRN
  Administered 2013-05-09 – 2013-05-10 (×3): 10 mL via ORAL
  Filled 2013-05-09 (×3): qty 15

## 2013-05-09 MED ORDER — BISACODYL 10 MG RE SUPP
10.0000 mg | Freq: Once | RECTAL | Status: AC
  Administered 2013-05-09: 10 mg via RECTAL
  Filled 2013-05-09: qty 1

## 2013-05-09 MED ORDER — ENSURE COMPLETE PO LIQD: 237.0000 mL | Freq: Three times a day (TID) | ORAL | Status: DC

## 2013-05-09 MED ORDER — LIP MEDEX EX OINT
TOPICAL_OINTMENT | CUTANEOUS | Status: AC
  Administered 2013-05-09: 06:00:00
  Filled 2013-05-09: qty 7

## 2013-05-09 MED ORDER — MAGNESIUM HYDROXIDE 400 MG/5ML PO SUSP
30.0000 mL | Freq: Once | ORAL | Status: AC
  Administered 2013-05-09: 30 mL via ORAL
  Filled 2013-05-09: qty 30

## 2013-05-09 NOTE — Telephone Encounter (Signed)
Spoke w/ pharmacy she is able to get it now and they are waiting for her to pick up

## 2013-05-09 NOTE — Progress Notes (Signed)
Patient c/o of constipation with decreased appetite. No bm since pre-op on 05/02/13. Dr. Abran CantorMatin notified and telephone orders verified for mom 30cc po x1 and dulcolax suppository x1.

## 2013-05-09 NOTE — Progress Notes (Signed)
INITIAL NUTRITION ASSESSMENT  DOCUMENTATION CODES Per approved criteria  -Not Applicable   INTERVENTION: - Ensure Complete TID - Encouraged bland soft food intake that pt should chew thoroughly to pureed texture - Educated pt on post op diet for nissen fundoplication and provided handouts of this information - Recommend changing anti-emetics from PRN to scheduled to help control nausea - Will continue to monitor   NUTRITION DIAGNOSIS: Inadequate oral intake related to poor appetite, nausea, constipation as evidenced by 25% meal intake.   Goal: 1. Resolution of nausea and constipation 2. Pt to consume >90% of meals/supplements  Monitor:  Weights, labs, intake, nausea, BM  Reason for Assessment: Poor intake  80 y.o. female  Admitting Dx: Hiatal hernia with symptomatic gastroesophageal reflux    ASSESSMENT: Pt with symptomatic reflux, admitted for hiatal hernia repair and nissen fundoplication which was performed 05/02/13. Met with pt who reports having nausea after surgery. Has been able to eat only 25% of meals. Had some Ensure Complete with ice yesterday which she tolerated well. Reports she's always been a small eater even before surgery. Weight has been stable. Pt c/o constipation, last BM documented was 1/6, received dulcolax suppository and milk of magnesia today.   Height: Ht Readings from Last 1 Encounters:  05/02/13 5' (1.524 m)    Weight: Wt Readings from Last 1 Encounters:  05/02/13 138 lb (62.596 kg)    Ideal Body Weight: 100 lb   % Ideal Body Weight: 138%  Wt Readings from Last 10 Encounters:  05/02/13 138 lb (62.596 kg)  05/02/13 138 lb (62.596 kg)  04/24/13 138 lb (62.596 kg)  02/19/13 134 lb 3.2 oz (60.873 kg)  02/06/13 133 lb (60.328 kg)  02/01/13 134 lb 8 oz (61.009 kg)  01/16/13 133 lb 8 oz (60.555 kg)  01/01/13 133 lb 3.2 oz (60.419 kg)  12/26/12 131 lb 8 oz (59.648 kg)  12/18/12 138 lb 3.2 oz (62.687 kg)    Usual Body Weight: 138 lb   %  Usual Body Weight: 100%  BMI:  Body mass index is 26.95 kg/(m^2).  Estimated Nutritional Needs: Kcal: 1400-1600 Protein: 55-75g Fluid: 1.4-1.6L/day  Skin: Non-pitting LLE, abdominal incision   Diet Order: Dysphagia 3, thin   EDUCATION NEEDS: -Education needs addressed - discussed post-op diet for nissen fundoplication and provided handouts of this information    Intake/Output Summary (Last 24 hours) at 05/09/13 1610 Last data filed at 05/09/13 1400  Gross per 24 hour  Intake   1530 ml  Output   1050 ml  Net    480 ml    Last BM: 1/6  Labs:   Recent Labs Lab 05/03/13 0600 05/04/13 0429  NA 139 137  K 4.0 4.1  CL 102 100  CO2 25 26  BUN 15 9  CREATININE 0.89 0.95  CALCIUM 8.9 9.2  GLUCOSE 117* 103*    CBG (last 3)  No results found for this basename: GLUCAP,  in the last 72 hours  Scheduled Meds: . heparin  5,000 Units Subcutaneous Q8H    Continuous Infusions: . dextrose 5 % and 0.45 % NaCl with KCl 20 mEq/L 50 mL/hr at 05/09/13 1400    Past Medical History  Diagnosis Date  . Chronic pain     DJD knees, back, migranes, LLE varicose veins, and LLE neuropathy.   . Aortic stenosis     Dx ECHO 2008 , mild and aymptomatic , needs ECHO q 3-5 yrs  . Barrett's esophagus     Demonstrated on  EGD 12/2010. EGD 09/17/2012 shows inflamed GE junctional mucosa without metaplasia, dysplasia, or malignancy.  . Chronic insomnia   . Chronic anxiety     Admission to [facility] (90s or early 2000)  for 6 weeks after mother died. Complicated again by death of her sister 2010. 2012 developed hallucinations and I refused to refill controlled meds unless she see psych which she is not agreable to  . Elevated cholesterol 10/11    LDL 155. Her 10 year risk (decreasing her age to 40 as the calculator won't go to age 71) is 21% ish. If consider her non smoker (which I wouldn't even though she says 1 pak lasts 2 weeks) risk is 12% ish. So goal for LDL is 130 or 100.  Marland Kitchen HTN (hypertension)      Controlled with 2 drug therapy  . Cataract   . Depression   . Gastroparesis     Demonstrated on GES 12/2010 by Dr Karmen Stabs  . Bronchitis     hx of  . Left leg DVT 09/28/2012  . Anemia 12/14/2012  . GERD (gastroesophageal reflux disease)   . H/O hiatal hernia   . Migraine     sometimes    Past Surgical History  Procedure Laterality Date  . Direct laryngoscopy   September 2008     preoperative diagnosis hoarseness with anterior right vocal cord lesion -  direct laryngoscopy and excisional biopsy of right anterior vocal cord lesion done by Dr. Lucia Gaskins  . Meniscectomy   July 2002     preoperative diagnosis torn medial meniscus right knee, partial medial meniscectomy, debridement chondroplasty patellofemoral joint, done by Dr. French Ana  . Abdominal hysterectomy    . Hemorrhoid surgery    . Appendectomy    . Cataract extraction      left eye  . Polypectomy  2000    Dr.Magod  . Colonoscopy  2000&2005    Dr.Magod  . Esophagogastroduodenoscopy  11/2010  . Knee arthroscopy    . Eye surgery    . Rotator cuff repair  09/21/2011    rt shoulder  . Esophagogastroduodenoscopy N/A 09/17/2012    Procedure: ESOPHAGOGASTRODUODENOSCOPY (EGD);  Surgeon: Lafayette Dragon, MD;  Location: [facility] ENDOSCOPY;  Service: Endoscopy;  Laterality: N/A;  . Esophagogastroduodenoscopy N/A 12/17/2012    Procedure: ESOPHAGOGASTRODUODENOSCOPY (EGD);  Surgeon: Jerene Bears, MD;  Location: [place];  Service: Gastroenterology;  Laterality: N/A;  . Laparoscopic nissen fundoplication N/A 0/12/8117    Procedure: LAPAROSCOPIC NISSEN FUNDOPLICATION;  Surgeon: [name], MD;  Location: [facility];  Service: General;  Laterality: N/A;    Mikey College MS, [place], [place] Pager [phone] After Hours Pager

## 2013-05-09 NOTE — Progress Notes (Signed)
Attempted to restart IV on her R hand but failed, IV team paged to restart new IV line for pt per protocol.

## 2013-05-09 NOTE — Progress Notes (Signed)
Patient ID: Sherri ShieldsMattie Mcmillan, female   DOB: 10/11/1932, 80 y.o.   MRN: [id number] [facility] Note:   7 Days Post-Op  Subjective: Mental status is clear.   Objective: Vital signs in last 24 hours: Temp:  [97.9 F (36.6 C)-98.7 F (37.1 C)] 98 F (36.7 C) (01/14 0555) Pulse Rate:  [58-87] 58 (01/14 0555) Resp:  [18] 18 (01/14 0555) BP: (118-153)/(67-80) 153/80 mmHg (01/14 0555) SpO2:  [97 %] 97 % (01/14 0555)  Intake/Output from previous day: 01/13 0701 - 01/14 0700 In: 1603.3 [P.O.:720; I.V.:883.3] Out: 1200 [Urine:1200] Intake/Output this shift:    Physical Exam: Work of breathing is normal.  Minimal pain.  Struggles with eating  Lab Results:  No results found for this or any previous visit (from the past 48 hour(s)).  Radiology/Results: No results found.  Anti-infectives: Anti-infectives   Start     Dose/Rate Route Frequency Ordered Stop   05/02/13 1600  ceFAZolin (ANCEF) IVPB 1 g/50 mL premix     1 g 100 mL/hr over 30 Minutes Intravenous 3 times per day 05/02/13 1312 05/02/13 1729   05/02/13 0700  ceFAZolin (ANCEF) IVPB 2 g/50 mL premix    Comments:  Dose decreased to 2g per P&T policy for weight < 120kg.   2 g 100 mL/hr over 30 Minutes Intravenous On call to O.R. 05/02/13 0656 05/02/13 0832      Assessment/Plan: Problem List: Patient Active Problem List   Diagnosis Date Noted  . S/P Nissen fundoplication (without gastrostomy tube) procedure 05/02/2013  . Hiatal hernia 02/19/2013  . Hypokalemia 02/03/2013  . Acute blood loss anemia 12/15/2012  . Other specified anemias 12/14/2012  . Anemia 12/14/2012  . Spitting blood 12/12/2012  . Anemia, unspecified 12/12/2012  . Warfarin-induced coagulopathy 12/11/2012  . Left leg DVT 10/05/2012  . Stricture and stenosis of esophagus 09/17/2012  . Reflux esophagitis 09/17/2012  . Bicipital tendinitis of right shoulder 08/26/2011  . Rotator cuff syndrome of right shoulder 08/09/2011  . Barrett's  esophagus 01/05/2011  . GERD (gastroesophageal reflux disease) 08/03/2010  . Chronic pain   . Elevated cholesterol   . Chronic anxiety   . HTN (hypertension)   . Aortic stenosis   . INSOMNIA, PERSISTENT 07/27/2006  . VENOUS INSUFFICIENCY 07/06/2006    Will change to oral pain meds.  Plan discharge tomorrow.  7 Days Post-Op    LOS: 7 days   Matt B. Daphine DeutscherMartin, MD, [facility]  [facility] [phone] beeper [phone]  05/09/2013 7:23 AM

## 2013-05-10 MED ORDER — HYDROCODONE-ACETAMINOPHEN 7.5-325 MG/15ML PO SOLN: 10.0000 mL | ORAL | Status: DC | PRN

## 2013-05-10 NOTE — Discharge Instructions (Signed)
Nissen Fundoplication Care After Please read the instructions outlined below and refer to this sheet for the next few weeks. These discharge instructions provide you with general information on caring for yourself after you leave the hospital. Your doctor may also give you specific instructions. While your treatment has been planned according to the most current medical practices available, unavoidable complications sometimes happen. If you have any problems or questions after discharge, please call your doctor. ACTIVITY  Take frequent rest periods throughout the day.  Take frequent walks throughout the day. This will help to prevent blood clots.  Continue to do your coughing and deep breathing exercises once you get home. This will help to prevent pneumonia.  No strenuous activities such as heavy lifting, pushing or pulling until after your follow-up visit with your doctor. Do not lift anything heavier than 10 pounds.  Talk with your caregiver about when you may return to work and your exercise routine.  You may shower 2 days after surgery. Pat incisions dry. Do not rub incisions with washcloth or towel.  Do not drive while taking prescription pain medication. NUTRITION  Continue with a liquid diet, or the diet you were directed to take, until your first follow-up visit with your surgeon.  Drink fluids (6-8 glasses a day).  Call your caregiver for persistent nausea (feeling sick to your stomach), vomiting, bloating or difficulty swallowing. ELIMINATION It is very important not to strain during bowel movements. If constipation should occur, you may:  Take a mild laxative (such as Milk of Magnesia).  Add fruit and bran to your diet.  Drink more fluids.  Call your caregiver if constipation is not relieved. FEVER If you feel feverish or have shaking chills, take your temperature. If it is 102 F (38.9 C) or above, call your caregiver. The fever may mean there is an infection. PAIN  CONTROL  If a prescription was given for a pain reliever, please follow your caregiver's directions.  Only take over-the-counter or prescription medicines for pain, discomfort, or fever as directed by your caregiver.  If the pain is not relieved by your medicine, becomes worse, or you have difficulty breathing, call your doctor. INCISION  It is normal for your cuts (incisions) from surgery to have a small amount of drainage for the first 1-2 days. Once the drainage has stopped, leave your incision(s) open to air.  Check your incision(s) and surrounding area daily for any redness, swelling, increased drainage or bleeding. If any of these are present or if the wound edges start to separate, call your doctor.  If you have small adhesive strips in place, they will peel and fall off. (If these strips are covered with a clear bandage, your doctor will tell you when to remove them.)  If you have staples, your caregiver will remove them at the follow-up appointment. Document Released: 12/04/2003 Document Revised: 07/05/2011 Document Reviewed: 03/09/2007 Exit[employer] Patient Information 2014 [employer].  

## 2013-05-10 NOTE — Discharge Summary (Signed)
Physician Discharge Summary  Patient ID: Sherri Mcmillan MRN: [id number] DOB/AGE: Sep 13, 1932 80 y.o.  Admit date: 05/02/2013 Discharge date: 05/10/2013  Admission Diagnoses:  Large type III hiatus hernia  Discharge Diagnoses:  Same post repair  Active Problems:   S/P Nissen fundoplication (without gastrostomy tube) procedure   Surgery:  Lap Nissen and repair of hiatus hernia  Discharged Condition: improved  Hospital Course:   Had surgery.  Bothered by dysphagia and some deconditioning.  Slow improvement.  Ready for discharge  Consults: none  Significant Diagnostic Studies: UGI    Discharge Exam: Blood pressure 164/78, pulse 48, temperature 98.5 F (36.9 C), temperature source Oral, resp. rate 17, height 5' (1.524 m), weight 138 lb (62.596 kg), last menstrual period 04/26/1950, SpO2 95.00%. Incisions OK.   Disposition: 01-Home or Self Care  Discharge Orders   Future Appointments Provider Department Dept Phone   05/16/2013 9:00 AM Valarie Merino, MD [facility], [place] [phone]   Future Orders Complete By Expires   Diet - low sodium heart healthy  As directed    Discharge instructions  As directed    Comments:     Stay on pureed diet for 3-4 weeks May shower and shampoo as needed   Increase activity slowly  As directed    No wound care  As directed        Medication List    STOP taking these medications       HYDROcodone-acetaminophen 5-325 MG per tablet  Commonly known as:  NORCO/VICODIN  Replaced by:  HYDROcodone-acetaminophen 7.5-325 mg/15 ml solution      TAKE these medications       albuterol 108 (90 BASE) MCG/ACT inhaler  Commonly known as:  PROVENTIL HFA;VENTOLIN HFA  Inhale 2 puffs into the lungs every 4 (four) hours as needed for wheezing or shortness of breath.     amLODipine 10 MG tablet  Commonly known as:  NORVASC  Take 10 mg by mouth daily with breakfast.     citalopram 40 MG tablet  Commonly known as:  CELEXA  TAKE 1 TABLET (40  MG TOTAL) BY MOUTH DAILY.     docusate sodium 100 MG capsule  Commonly known as:  COLACE  Take 100 mg by mouth daily.     furosemide 40 MG tablet  Commonly known as:  LASIX  Take 60 mg by mouth daily with breakfast.     HYDROcodone-acetaminophen 7.5-325 mg/15 ml solution  Commonly known as:  HYCET  Take 10 mLs by mouth every 3 (three) hours as needed for moderate pain.     metoCLOPramide 5 MG tablet  Commonly known as:  REGLAN  Take 5 mg by mouth 4 (four) times daily -  before meals and at bedtime.     ondansetron 4 MG disintegrating tablet  Commonly known as:  ZOFRAN-ODT  Take 4 mg by mouth every 8 (eight) hours as needed for nausea or vomiting.     pantoprazole 40 MG tablet  Commonly known as:  PROTONIX  Take 1 tablet (40 mg total) by mouth 2 (two) times daily.     potassium chloride 10 MEQ tablet  Commonly known as:  K-DUR,KLOR-CON  Take 10 mEq by mouth daily.     sucralfate 1 G tablet  Commonly known as:  CARAFATE  Take 1 tablet (1 g total) by mouth 4 (four) times daily.           Follow-up Information   Follow up with Valarie Merino, MD.   Specialty:  General Surgery   Contact information:   [address] [place] [address] [phone]       Signed: Valarie MerinoMARTIN, B 05/10/2013, 8:03 AM

## 2013-05-16 ENCOUNTER — Encounter (INDEPENDENT_AMBULATORY_CARE_PROVIDER_SITE_OTHER): Payer: Self-pay | Admitting: Surgery

## 2013-05-16 ENCOUNTER — Ambulatory Visit (INDEPENDENT_AMBULATORY_CARE_PROVIDER_SITE_OTHER): Payer: Medicare HMO | Admitting: Surgery

## 2013-05-16 VITALS — BP 114/86 | HR 68 | Temp 97.0°F | Resp 16 | Ht 60.0 in | Wt 131.2 lb

## 2013-05-16 DIAGNOSIS — Z9889 Other specified postprocedural states: Secondary | ICD-10-CM

## 2013-05-16 DIAGNOSIS — Z09 Encounter for follow-up examination after completed treatment for conditions other than malignant neoplasm: Secondary | ICD-10-CM

## 2013-05-16 MED ORDER — HYDROCODONE-ACETAMINOPHEN 5-325 MG PO TABS
1.0000 | ORAL_TABLET | Freq: Four times a day (QID) | ORAL | Status: DC | PRN
Start: 2013-05-16 — End: 2013-06-28

## 2013-05-16 NOTE — Progress Notes (Signed)
Sherri Mcmillan 80 y.o.  Body mass index is 25.62 kg/(m^2).  Patient Active Problem List   Diagnosis Date Noted  . S/P Nissen fundoplication (without gastrostomy tube) procedure 05/02/2013  . Hiatal hernia 02/19/2013  . Hypokalemia 02/03/2013  . Acute blood loss anemia 12/15/2012  . Other specified anemias 12/14/2012  . Anemia 12/14/2012  . Spitting blood 12/12/2012  . Anemia, unspecified 12/12/2012  . Warfarin-induced coagulopathy 12/11/2012  . Left leg DVT 10/05/2012  . Stricture and stenosis of esophagus 09/17/2012  . Reflux esophagitis 09/17/2012  . Bicipital tendinitis of right shoulder 08/26/2011  . Rotator cuff syndrome of right shoulder 08/09/2011  . Barrett's esophagus 01/05/2011  . GERD (gastroesophageal reflux disease) 08/03/2010  . Chronic pain   . Elevated cholesterol   . Chronic anxiety   . HTN (hypertension)   . Aortic stenosis   . INSOMNIA, PERSISTENT 07/27/2006  . VENOUS INSUFFICIENCY 07/06/2006    Allergies  Allergen Reactions  . Aspirin Rash    Past Surgical History  Procedure Laterality Date  . Direct laryngoscopy   September 2008     preoperative diagnosis hoarseness with anterior right vocal cord lesion -  direct laryngoscopy and excisional biopsy of right anterior vocal cord lesion done by Dr. Ezzard StandingNewman  . Meniscectomy   July 2002     preoperative diagnosis torn medial meniscus right knee, partial medial meniscectomy, debridement chondroplasty patellofemoral joint, done by Dr. Madelon Lipsaffrey  . Abdominal hysterectomy    . Hemorrhoid surgery    . Appendectomy    . Cataract extraction      left eye  . Polypectomy  2000    Dr.Magod  . Colonoscopy  2000&2005    Dr.Magod  . Esophagogastroduodenoscopy  11/2010  . Knee arthroscopy    . Eye surgery    . Rotator cuff repair  09/21/2011    rt shoulder  . Esophagogastroduodenoscopy N/A 09/17/2012    Procedure: ESOPHAGOGASTRODUODENOSCOPY (EGD);  Surgeon: Hart Carwinora M Brodie, MD;  [facility];  Service:  Endoscopy;  Laterality: N/A;  . Esophagogastroduodenoscopy N/A 12/17/2012    Procedure: ESOPHAGOGASTRODUODENOSCOPY (EGD);  Surgeon: Beverley FiedlerJay M Pyrtle, MD;  [facility];  Service: Gastroenterology;  Laterality: N/A;  . Laparoscopic nissen fundoplication N/A 05/02/2013    Procedure: LAPAROSCOPIC NISSEN FUNDOPLICATION;  Surgeon: Valarie MerinoMatthew B , MD;  Location: [facility];  Service: General;  Laterality: N/A;   LI, NA, MD No diagnosis found.  Incisions OK.  We adjusted her meds and stopped the PPI/sucrafate Return 6 weeks Matt B. Daphine Deutscher, MD, [facility]  [facility] [phone] beeper [phone]  05/16/2013 9:25 AM

## 2013-05-16 NOTE — Patient Instructions (Signed)
Stop the meds that we went over to stop.  Gradually increase the consistency of the diet from pureed to solids over the next 4 weeks

## 2013-06-28 ENCOUNTER — Ambulatory Visit (INDEPENDENT_AMBULATORY_CARE_PROVIDER_SITE_OTHER): Payer: Medicare HMO | Admitting: Surgery

## 2013-06-28 ENCOUNTER — Encounter (INDEPENDENT_AMBULATORY_CARE_PROVIDER_SITE_OTHER): Payer: Self-pay | Admitting: Surgery

## 2013-06-28 VITALS — BP 112/72 | HR 66 | Temp 97.2°F | Resp 16

## 2013-06-28 DIAGNOSIS — Z09 Encounter for follow-up examination after completed treatment for conditions other than malignant neoplasm: Secondary | ICD-10-CM

## 2013-06-28 DIAGNOSIS — Z9889 Other specified postprocedural states: Secondary | ICD-10-CM

## 2013-06-28 MED ORDER — ONDANSETRON 4 MG PO TBDP: 4.0000 mg | ORAL_TABLET | Freq: Three times a day (TID) | ORAL | Status: DC | PRN

## 2013-06-28 MED ORDER — HYDROCODONE-ACETAMINOPHEN 5-325 MG PO TABS
1.0000 | ORAL_TABLET | Freq: Four times a day (QID) | ORAL | Status: DC | PRN
Start: 2013-06-28 — End: 2013-11-14

## 2013-06-28 NOTE — Progress Notes (Signed)
Sherri Mcmillan 80 y.o.  There is no weight on file to calculate BMI.  Patient Active Problem List   Diagnosis Date Noted  . S/P Nissen fundoplication (without gastrostomy tube) procedure 05/02/2013  . Hiatal hernia 02/19/2013  . Hypokalemia 02/03/2013  . Acute blood loss anemia 12/15/2012  . Other specified anemias 12/14/2012  . Anemia 12/14/2012  . Spitting blood 12/12/2012  . Anemia, unspecified 12/12/2012  . Warfarin-induced coagulopathy 12/11/2012  . Left leg DVT 10/05/2012  . Stricture and stenosis of esophagus 09/17/2012  . Reflux esophagitis 09/17/2012  . Bicipital tendinitis of right shoulder 08/26/2011  . Rotator cuff syndrome of right shoulder 08/09/2011  . Barrett's esophagus 01/05/2011  . GERD (gastroesophageal reflux disease) 08/03/2010  . Chronic pain   . Elevated cholesterol   . Chronic anxiety   . HTN (hypertension)   . Aortic stenosis   . INSOMNIA, PERSISTENT 07/27/2006  . VENOUS INSUFFICIENCY 07/06/2006    Allergies  Allergen Reactions  . Aspirin Rash    Past Surgical History  Procedure Laterality Date  . Direct laryngoscopy   September 2008     preoperative diagnosis hoarseness with anterior right vocal cord lesion -  direct laryngoscopy and excisional biopsy of right anterior vocal cord lesion done by Dr. Ezzard StandingNewman  . Meniscectomy   July 2002     preoperative diagnosis torn medial meniscus right knee, partial medial meniscectomy, debridement chondroplasty patellofemoral joint, done by Dr. Madelon Lipsaffrey  . Abdominal hysterectomy    . Hemorrhoid surgery    . Appendectomy    . Cataract extraction      left eye  . Polypectomy  2000    Dr.Magod  . Colonoscopy  2000&2005    Dr.Magod  . Esophagogastroduodenoscopy  11/2010  . Knee arthroscopy    . Eye surgery    . Rotator cuff repair  09/21/2011    rt shoulder  . Esophagogastroduodenoscopy N/A 09/17/2012    Procedure: ESOPHAGOGASTRODUODENOSCOPY (EGD);  Surgeon: Hart Carwinora M Brodie, MD;  [facility];   Service: Endoscopy;  Laterality: N/A;  . Esophagogastroduodenoscopy N/A 12/17/2012    Procedure: ESOPHAGOGASTRODUODENOSCOPY (EGD);  Surgeon: Beverley FiedlerJay M Pyrtle, MD;  [facility];  Service: Gastroenterology;  Laterality: N/A;  . Laparoscopic nissen fundoplication N/A 05/02/2013    Procedure: LAPAROSCOPIC NISSEN FUNDOPLICATION;  Surgeon: Valarie MerinoMatthew B , MD;  Location: [facility] ORS;  Service: General;  Laterality: N/A;   LI, NA, MD No diagnosis found.  Doing great.  Sang at church last week and sounded good-standing ovation.  Gradually increasing the consistency of her diet.  Occasional pains in the left upper quadrand incision.   Will check back in 3 months.  Matt B. Daphine Deutscher, MD, [facility]  [facility] [phone] [phone]  06/28/2013 9:29 AM

## 2013-06-28 NOTE — Patient Instructions (Signed)
May advance diet as tolerated.  Eating slowly and chew food well.

## 2013-08-15 ENCOUNTER — Other Ambulatory Visit: Payer: Self-pay | Admitting: Internal Medicine

## 2013-08-24 ENCOUNTER — Other Ambulatory Visit (HOSPITAL_COMMUNITY): Payer: Self-pay | Admitting: Internal Medicine

## 2013-08-24 DIAGNOSIS — K5900 Constipation, unspecified: Secondary | ICD-10-CM

## 2013-10-27 ENCOUNTER — Telehealth (INDEPENDENT_AMBULATORY_CARE_PROVIDER_SITE_OTHER): Payer: Self-pay | Admitting: General Surgery

## 2013-10-27 NOTE — Telephone Encounter (Signed)
S/p nissen and hh repair by dr Daphine Deutschermartin. For two weeks she has had some difficulty swallowing mostly solids but a little with liquids. She called today to ask what she should do.  She is staying hydrated and eating some.  She has no other symptoms.  I told her I would see her in er if she was unable to tolerate any po.  She would like to try to stay on clears with supplements and then get in early next week to see Dr Daphine DeutscherMartin. If she worsens I asked her to come to er asap.

## 2013-10-29 ENCOUNTER — Telehealth (INDEPENDENT_AMBULATORY_CARE_PROVIDER_SITE_OTHER): Payer: Self-pay | Admitting: General Surgery

## 2013-10-29 NOTE — Telephone Encounter (Signed)
LMOM asking pt to return my call.  This is so that I may check on her over the weekend.  She called in c/o  Difficulty getting solid foods down.  Also wanted to inform her that I made her an appt w/ D. Martin on 7/10 at 2:20.

## 2013-11-01 ENCOUNTER — Telehealth (INDEPENDENT_AMBULATORY_CARE_PROVIDER_SITE_OTHER): Payer: Self-pay

## 2013-11-01 NOTE — Telephone Encounter (Signed)
Called and left message for patient RE:  appt time needs to be rescheduled to 12:25pm.  Patient needs to arrive at 12:00 for registration.  If patient cannot come in at that time then we will need to reschedule appointment to next week.

## 2013-11-01 NOTE — Telephone Encounter (Signed)
Called and left message for patient regarding appointment being rescheduled to 11/21/13 @ 1:30pm w/Dr. Daphine DeutscherMartin

## 2013-11-02 ENCOUNTER — Encounter (INDEPENDENT_AMBULATORY_CARE_PROVIDER_SITE_OTHER): Payer: Medicare HMO | Admitting: Surgery

## 2013-11-06 NOTE — Telephone Encounter (Signed)
Patient has appointment on 11/21/13 @ 10:00 w/Dr. Daphine DeutscherMartin.

## 2013-11-14 ENCOUNTER — Encounter: Payer: Self-pay | Admitting: Internal Medicine

## 2013-11-14 ENCOUNTER — Ambulatory Visit (INDEPENDENT_AMBULATORY_CARE_PROVIDER_SITE_OTHER): Payer: Medicare HMO | Admitting: Internal Medicine

## 2013-11-14 ENCOUNTER — Telehealth: Payer: Self-pay | Admitting: *Deleted

## 2013-11-14 VITALS — BP 127/68 | HR 62 | Temp 98.7°F | Wt 122.3 lb

## 2013-11-14 DIAGNOSIS — K921 Melena: Secondary | ICD-10-CM | POA: Insufficient documentation

## 2013-11-14 LAB — CBC
HEMATOCRIT: 29.6 % — AB (ref 36.0–46.0)
Hemoglobin: 9.7 g/dL — ABNORMAL LOW (ref 12.0–15.0)
MCH: 30.2 pg (ref 26.0–34.0)
MCHC: 32.8 g/dL (ref 30.0–36.0)
MCV: 92.2 fL (ref 78.0–100.0)
PLATELETS: 222 10*3/uL (ref 150–400)
RBC: 3.21 MIL/uL — ABNORMAL LOW (ref 3.87–5.11)
RDW: 13.3 % (ref 11.5–15.5)
WBC: 4.1 10*3/uL (ref 4.0–10.5)

## 2013-11-14 LAB — COMPLETE METABOLIC PANEL WITH GFR
ALK PHOS: 55 U/L (ref 39–117)
ALT: 7 U/L (ref 0–35)
AST: 14 U/L (ref 0–37)
Albumin: 3.6 g/dL (ref 3.5–5.2)
BUN: 20 mg/dL (ref 6–23)
CO2: 29 mEq/L (ref 19–32)
Calcium: 8.9 mg/dL (ref 8.4–10.5)
Chloride: 105 mEq/L (ref 96–112)
Creat: 0.87 mg/dL (ref 0.50–1.10)
GFR, Est African American: 73 mL/min
GFR, Est Non African American: 64 mL/min
Glucose, Bld: 91 mg/dL (ref 70–99)
Potassium: 4 mEq/L (ref 3.5–5.3)
SODIUM: 144 meq/L (ref 135–145)
Total Bilirubin: <0.2 mg/dL — ABNORMAL LOW (ref 0.2–1.2)
Total Protein: 6.9 g/dL (ref 6.0–8.3)

## 2013-11-14 NOTE — Patient Instructions (Signed)
1. I am checking labs to make sure your bleeding is not too severe and to check on your kidney and liver function.  Please stop taking Aleve because this medicine probably irritated your stomach and caused bleeding/dark stools.  Your stool color should return to normal once you stop taking Aleve.  I am refilling your Protonix.  This medication will help protect your stomach.  If your labs are not normal I will call you because you may need to come back to the hospital.  Otherwise, I will have you follow-up with your stomach doctor.  I want you to come back to see me next month.  If your stool continues to be dark or if you have bright red stool please come back to clinic or go to the emergency room.   2. Please take all medications as prescribed.  Please stop taking Aleve.  Try Tylenol for your back and headache pain.  When you come back to see me we can discuss other options for migraine pain.   3. If you have worsening of your symptoms or new symptoms arise, please call the clinic ([phone]), or go to the ER immediately if symptoms are severe.   Bloody Stools Bloody stools often mean that there is a problem in the digestive tract. Your caregiver may use the term "melena" to describe black, tarry, and bad smelling stools or "hematochezia" to describe red or maroon-colored stools. Blood seen in the stool can be caused by bleeding anywhere along the intestinal tract.  A black stool usually means that blood is coming from the upper part of the gastrointestinal tract (esophagus, stomach, or small bowel). Passing maroon-colored stools or bright red blood usually means that blood is coming from lower down in the large bowel or the rectum. However, sometimes massive bleeding in the stomach or small intestine can cause bright red bloody stools.  Consuming black licorice, lead, iron pills, medicines containing bismuth subsalicylate, or blueberries can also cause black stools. Your caregiver can test black stools  to see if blood is present. It is important that the cause of the bleeding be found. Treatment can then be started, and the problem can be corrected. Rectal bleeding may not be serious, but you should not assume everything is okay until you know the cause.It is very important to follow up with your caregiver or a specialist in gastrointestinal problems. CAUSES  Blood in the stools can come from various underlying causes.Often, the cause is not found during your first visit. Testing is often needed to discover the cause of bleeding in the gastrointestinal tract. Causes range from simple to serious or even life-threatening.Possible causes include:  Hemorrhoids.These are veins that are full of blood (engorged) in the rectum. They cause pain, inflammation, and may bleed.  Anal fissures.These are areas of painful tearing which may bleed. They are often caused by passing hard stool.  Diverticulosis.These are pouches that form on the colon over time, with age, and may bleed significantly.  Diverticulitis.This is inflammation in areas with diverticulosis. It can cause pain, fever, and bloody stools, although bleeding is rare.  Proctitis and colitis. These are inflamed areas of the rectum or colon. They may cause pain, fever, and bloody stools.  Polyps and cancer. Colon cancer is a leading cause of preventable cancer death.It often starts out as precancerous polyps that can be removed during a colonoscopy, preventing progression into cancer. Sometimes, polyps and cancer may cause rectal bleeding.  Gastritis and ulcers.Bleeding from the upper gastrointestinal tract (near  the stomach) may travel through the intestines and produce black, sometimes tarry, often bad smelling stools. In certain cases, if the bleeding is fast enough, the stools may not be black, but red and the condition may be life-threatening. SYMPTOMS  You may have stools that are bright red and bloody, that are normal color with  blood on them, or that are dark black and tarry. In some cases, you may only have blood in the toilet bowl. Any of these cases need medical care. You may also have:  Pain at the anus or anywhere in the rectum.  Lightheadedness or feeling faint.  Extreme weakness.  Nausea or vomiting.  Fever. DIAGNOSIS Your caregiver may use the following methods to find the cause of your bleeding:  Taking a medical history. Age is important. Older people tend to develop polyps and cancer more often. If there is anal pain and a hard, large stool associated with bleeding, a tear of the anus may be the cause. If blood drips into the toilet after a bowel movement, bleeding hemorrhoids may be the problem. The color and frequency of the bleeding are additional considerations. In most cases, the medical history provides clues, but seldom the final answer.  A visual and finger (digital) exam. Your caregiver will inspect the anal area, looking for tears and hemorrhoids. A finger exam can provide information when there is tenderness or a growth inside. In men, the prostate is also examined.  Endoscopy. Several types of small, long scopes (endoscopes) are used to view the colon.  In the office, your caregiver may use a rigid, or more commonly, a flexible viewing sigmoidoscope. This exam is called flexible sigmoidoscopy. It is performed in 5 to 10 minutes.  A more thorough exam is accomplished with a colonoscope. It allows your caregiver to view the entire 5 to 6 foot long colon. Medicine to help you relax (sedative) is usually given for this exam. Frequently, a bleeding lesion may be present beyond the reach of the sigmoidoscope. So, a colonoscopy may be the best exam to start with. Both exams are usually done on an outpatient basis. This means the patient does not stay overnight in the hospital or surgery center.  An upper endoscopy may be needed to examine your stomach. Sedation is used and a flexible endoscope is put  in your mouth, down to your stomach.  A barium enema X-ray. This is an X-ray exam. It uses liquid barium inserted by enema into the rectum. This test alone may not identify an actual bleeding point. X-rays highlight abnormal shadows, such as those made by lumps (tumors), diverticuli, or colitis. TREATMENT  Treatment depends on the cause of your bleeding.   For bleeding from the stomach or colon, the caregiver doing your endoscopy or colonoscopy may be able to stop the bleeding as part of the procedure.  Inflammation or infection of the colon can be treated with medicines.  Many rectal problems can be treated with creams, suppositories, or warm baths.  Surgery is sometimes needed.  Blood transfusions are sometimes needed if you have lost a lot of blood.  For any bleeding problem, let your caregiver know if you take aspirin or other blood thinners regularly. HOME CARE INSTRUCTIONS   Take any medicines exactly as prescribed.  Keep your stools soft by eating a diet high in fiber. Prunes (1 to 3 a day) work well for many people.  Drink enough water and fluids to keep your urine clear or pale yellow.  Take sitz  baths if advised. A sitz bath is when you sit in a bathtub with warm water for 10 to 15 minutes to soak, soothe, and cleanse the rectal area.  If enemas or suppositories are advised, be sure you know how to use them. Tell your caregiver if you have problems with this.  Monitor your bowel movements to look for signs of improvement or worsening. SEEK MEDICAL CARE IF:   You do not improve in the time expected.  Your condition worsens after initial improvement.  You develop any new symptoms. SEEK IMMEDIATE MEDICAL CARE IF:   You develop severe or prolonged rectal bleeding.  You vomit blood.  You feel weak or faint.  You have a fever. MAKE SURE YOU:  Understand these instructions.  Will watch your condition.  Will get help right away if you are not doing well or get  worse. Document Released: 04/02/2002 Document Revised: 07/05/2011 Document Reviewed: 08/28/2010 [facility] Patient Information 2015 [employer] [place]. This information is not intended to replace advice given to you by your health care provider. Make sure you discuss any questions you have with your health care provider.

## 2013-11-14 NOTE — Progress Notes (Signed)
Subjective:    Patient ID: Sherri Mcmillan, female    DOB: 06-25-1932, 80 y.o.   MRN: [id number]  HPI Comments: Sherri Mcmillan is a 80 year old woman with a PMH of GERD, hiatal hernia (s/p Nissen's fundoplication), esophageal stricture (s/p dilatation), hx of DVT (last year, off coumadin for over 1 year), AS, HTN here with c/o of black, tarry stools x 2 weeks.  She has normal BMs every other day.  She takes Aleve for back pain.  She is taking 6 Aleve per day on average for the past 2 months.  She does not take other NSAIDs, anticoagulants or antiplatelets.   No BRBPR, blood on toilet paper, rectal pain, CP, abdominal pain, diarrhea, N/V, syncope, no EtOH, illicit drugs or smoking.  She has noticed occasional lightheadedness, not positional.    Low back pain x years, unchanged, 10/10 on a bad day.  Aleve helps for a little while but pain comes back.  She sometimes takes the NSAID on an empty stomach.    She also has history of migraines, unchanged, come and go.  She reports B/L headache and says she has to be "by herself."  + phonophobia and photophobia with H/A.  No N/V associated.  No jaw pain, jaw claudication, or loss of vision, fevers/chills, weakness.  More frequent now, she is unable to tell me exactly how many in one week.  She has never been on migraine prophylaxis.  She borrows her niece's medication from time to time.  Laying on right side helps at night.    LE edema - rx for Lasix 60mg  qd.  She tells me she does not take it on the weekends when she needs to travel.  Takes it 5/7 days on average.      Review of Systems  Constitutional: Negative for fever, chills and appetite change.  Eyes: Negative for pain and visual disturbance.  Respiratory: Negative for shortness of breath.   Cardiovascular: Positive for leg swelling. Negative for chest pain.  Gastrointestinal: Positive for blood in stool. Negative for nausea, vomiting, abdominal pain, diarrhea, constipation and rectal pain.   Melena x 2 weeks.  Genitourinary: Negative for dysuria, frequency and hematuria.  Musculoskeletal: Positive for back pain.       Chronic low back pain.  Neurological: Positive for light-headedness and headaches. Negative for syncope and weakness.       Chronic migraines  Hematological: Does not bruise/bleed easily.       Objective:   Physical Exam  Vitals reviewed. Constitutional: She is oriented to person, place, and time. She appears well-developed. No distress.  HENT:  Head: Normocephalic and atraumatic.  Mouth/Throat: Oropharynx is clear and moist. No oropharyngeal exudate.  Sinuses non-tender.  Temples non-tender.  Eyes: EOM are normal. Pupils are equal, round, and reactive to light.  Neck: Normal range of motion. Neck supple.  Cardiovascular: Normal rate and regular rhythm.  Exam reveals no gallop and no friction rub.   Murmur heard. Pulmonary/Chest: Effort normal and breath sounds normal. No respiratory distress. She has no wheezes. She has no rales.  Abdominal: Soft. Bowel sounds are normal. She exhibits no distension. There is no tenderness.  Musculoskeletal: Normal range of motion. She exhibits edema and tenderness.  Left leg with post-thrombotic changes; 1+ edema.  Lymphadenopathy:    She has no cervical adenopathy.  Neurological: She is alert and oriented to person, place, and time. No cranial nerve deficit.  Skin: Skin is warm. She is not diaphoretic.  Psychiatric: She has  a normal mood and affect. Her behavior is normal.          Assessment & Plan:  Please see problem based assessment and plan.

## 2013-11-14 NOTE — Assessment & Plan Note (Signed)
Likely 2/2 to NSAID use.  She is hemodynamically stable.  Not orthostatic.   - stat CBC shows stable anemia 9.7, near her baseline 9-11 - stat CMP to check renal function in the setting of chronic NSAID use - normal CMP - advised her to stop all NSAIDs; she can try Tylenol for chronic low back pain - refill Protonix (she has not taken it in 1 month) - refer to GI given recent hx of Nissen fundoplication (04/2013) - she is to seek emergency help if she has new or worsening symptoms - she will call clinic if she continues to have dark stools after stopping NSAIDs - RTC in 1 month for follow-up of other medical issues

## 2013-11-14 NOTE — Telephone Encounter (Signed)
LMR  Pt has appt August 17th at 1:45pm  DrKaplan with [facility] [phone]). No referral needed as pt as seen DrKaplan in the past.  Will mail appt time/date to pt.Kingsley SpittleGoldston, Darlene [name]7/22/20153:32 PM

## 2013-11-15 NOTE — Progress Notes (Signed)
Attending physician note: Presenting problems, physical findings, medications, assessment and plan, reviewed with resident physician Dr. Evelena PeatAlex Wilson and I concur with her evaluation above. Cephas DarbyJames , M.D., FACP

## 2013-11-21 ENCOUNTER — Encounter (INDEPENDENT_AMBULATORY_CARE_PROVIDER_SITE_OTHER): Payer: Medicare HMO | Admitting: Surgery

## 2013-12-05 ENCOUNTER — Encounter: Payer: Self-pay | Admitting: Internal Medicine

## 2013-12-05 ENCOUNTER — Ambulatory Visit (INDEPENDENT_AMBULATORY_CARE_PROVIDER_SITE_OTHER): Payer: Medicare HMO | Admitting: Internal Medicine

## 2013-12-05 VITALS — BP 140/75 | HR 72 | Temp 97.4°F | Ht 60.0 in | Wt 119.9 lb

## 2013-12-05 DIAGNOSIS — R51 Headache: Secondary | ICD-10-CM

## 2013-12-05 DIAGNOSIS — F411 Generalized anxiety disorder: Secondary | ICD-10-CM

## 2013-12-05 DIAGNOSIS — F419 Anxiety disorder, unspecified: Secondary | ICD-10-CM

## 2013-12-05 DIAGNOSIS — I350 Nonrheumatic aortic (valve) stenosis: Secondary | ICD-10-CM

## 2013-12-05 DIAGNOSIS — Z0000 Encounter for general adult medical examination without abnormal findings: Secondary | ICD-10-CM

## 2013-12-05 DIAGNOSIS — K219 Gastro-esophageal reflux disease without esophagitis: Secondary | ICD-10-CM

## 2013-12-05 DIAGNOSIS — I872 Venous insufficiency (chronic) (peripheral): Secondary | ICD-10-CM

## 2013-12-05 DIAGNOSIS — I359 Nonrheumatic aortic valve disorder, unspecified: Secondary | ICD-10-CM

## 2013-12-05 DIAGNOSIS — R21 Rash and other nonspecific skin eruption: Secondary | ICD-10-CM

## 2013-12-05 DIAGNOSIS — I10 Essential (primary) hypertension: Secondary | ICD-10-CM

## 2013-12-05 MED ORDER — ZOSTER VACCINE LIVE 19400 UNT/0.65ML ~~LOC~~ SOLR: 0.6500 mL | Freq: Once | SUBCUTANEOUS | Status: DC

## 2013-12-05 MED ORDER — PROPRANOLOL HCL 20 MG PO TABS: 20.0000 mg | ORAL_TABLET | Freq: Three times a day (TID) | ORAL | Status: DC

## 2013-12-05 MED ORDER — FUROSEMIDE 40 MG PO TABS: 60.0000 mg | ORAL_TABLET | Freq: Every day | ORAL | Status: DC

## 2013-12-05 MED ORDER — AMLODIPINE BESYLATE 10 MG PO TABS: 10.0000 mg | ORAL_TABLET | Freq: Every day | ORAL | Status: DC

## 2013-12-05 NOTE — Progress Notes (Signed)
   Subjective:    Patient ID: Sherri ShieldsMattie Mcmillan, female    DOB: October 23, 1932, 80 y.o.   MRN: [id number]  HPI Comments:  [name] is a 80 year old woman with a PMH of GERD, hiatal hernia (s/p Nissen's fundoplication), esophageal stricture (s/p dilatation), hx of DVT (last year, off coumadin for over 1 year), AS, HTN here for follow-up of chronic medical conditions.  I saw her three weeks ago for melena which has resolved since she stopped taking NSAID.  Please see problem based assessment and plan for update of her chronic medical conditions.  Itchy, red rash on left arm and back of neck x 3 weeks, improving.   No new environmental exposures or insect bites.       Review of Systems  Constitutional: Negative for fever, chills and appetite change.  Eyes: Negative for visual disturbance.  Respiratory: Negative for shortness of breath.   Cardiovascular: Negative for chest pain and palpitations.  Gastrointestinal: Negative for nausea, vomiting, abdominal pain, diarrhea, constipation and blood in stool.  Genitourinary: Negative for dysuria, frequency and hematuria.  Neurological: Positive for headaches. Negative for syncope, weakness and light-headedness.  Psychiatric/Behavioral: Positive for sleep disturbance. Negative for suicidal ideas. The patient is nervous/anxious.        Objective:   Physical Exam  Vitals reviewed. Constitutional: She is oriented to person, place, and time. She appears well-developed. No distress.  HENT:  Head: Normocephalic and atraumatic.  Mouth/Throat: Oropharynx is clear and moist. No oropharyngeal exudate.  Eyes: EOM are normal.  PERRL on right; left eye with post/op changes s/p cataract surgery  Cardiovascular: Normal rate and regular rhythm.   Murmur heard. +S1 +S2  Pulmonary/Chest: Effort normal and breath sounds normal. No respiratory distress. She has no wheezes. She has no rales.  Abdominal: Soft. Bowel sounds are normal. She exhibits no distension. There is  no tenderness.  Musculoskeletal: Normal range of motion. She exhibits edema and tenderness.  + 1 pretibial edema with venous stasis skin changes on left (unchanged from prior exam)  Neurological: She is alert and oriented to person, place, and time. No cranial nerve deficit.  Skin: Skin is warm. Rash noted. She is not diaphoretic. No erythema.  Dry skin and small papules on left forearm.  No redness, excoriation or skin breakdown.  Psychiatric: She has a normal mood and affect. Her behavior is normal.          Assessment & Plan:  Please see problem based assessment and plan.

## 2013-12-05 NOTE — Patient Instructions (Signed)
1. 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  Keep taking your citalopram.  Wear your compression stockings and keep legs elevated to help with leg pain and swelling.    2. Please take all medications as prescribed.     3. If you have worsening of your symptoms or new symptoms arise, please call the clinic ([phone]), or go to the ER immediately if symptoms are severe.  Propranolol tablets What is this medicine? PROPRANOLOL (proe PRAN oh lole) is a beta-blocker. Beta-blockers reduce the workload on the heart and help it to beat more regularly. This medicine is used to treat high blood pressure, to control irregular heart rhythms (arrhythmias) and to relieve chest pain caused by angina. It may also be helpful after a heart attack. This medicine is also used to prevent migraine headaches, relieve uncontrollable shaking (tremors), and help certain problems related to the thyroid gland and adrenal gland. This medicine may be used for other purposes; ask your health care provider or pharmacist if you have questions. COMMON BRAND NAME(S): Inderal What should I tell my health care provider before I take this medicine? They need to know if you have any of these conditions: -circulation problems or blood vessel disease -diabetes -history of heart attack or heart disease, vasospastic angina -kidney disease -liver disease -lung or breathing disease, like asthma or emphysema -pheochromocytoma -slow heart rate -thyroid disease -an unusual or allergic reaction to propranolol, other beta-blockers, medicines, foods, dyes, or preservatives -pregnant or trying to get pregnant -breast-feeding How should I use this medicine? Take this medicine by mouth with a glass of water. Follow the directions on the prescription label. Take your doses at regular intervals. Do not take your medicine more often than directed. Do not stop taking except on your  the advice of your doctor or health care professional. Talk to your pediatrician regarding the use of this medicine in children. Special care may be needed. Overdosage: If you think you have taken too much of this medicine contact a poison control center or emergency room at once. NOTE: This medicine is only for you. Do not share this medicine with others. What if I miss a dose? If you miss a dose, take it as soon as you can. If it is almost time for your next dose, take only that dose. Do not take double or extra doses. What may interact with this medicine? Do not take this medicine with any of the following medications: -feverfew -phenothiazines like chlorpromazine, mesoridazine, prochlorperazine, thioridazine This medicine may also interact with the following medications: -aluminum hydroxide gel -antipyrine -antiviral medicines for HIV or AIDS -barbiturates like phenobarbital -certain medicines for blood pressure, heart disease, irregular heart beat -cimetidine -ciprofloxacin -diazepam -fluconazole -haloperidol -isoniazid -medicines for cholesterol like cholestyramine or colestipol -medicines for mental depression -medicines for migraine headache like almotriptan, eletriptan, frovatriptan, naratriptan, rizatriptan, sumatriptan, zolmitriptan -NSAIDs, medicines for pain and inflammation, like ibuprofen or naproxen -phenytoin -rifampin -teniposide -theophylline -thyroid medicines -tolbutamide -warfarin -zileuton This list may not describe all possible interactions. Give your health care provider a list of all the medicines, herbs, non-prescription drugs, or dietary supplements you use. Also tell them if you smoke, drink alcohol, or use illegal drugs. Some items may interact with your medicine. What should I watch for while using this medicine? Visit your doctor or health care professional for regular check ups. Check your blood pressure and pulse rate regularly. Ask your health  care professional what your blood  pressure and pulse rate should be, and when you should contact them. You may get drowsy or dizzy. Do not drive, use machinery, or do anything that needs mental alertness until you know how this drug affects you. Do not stand or sit up quickly, especially if you are an older patient. This reduces the risk of dizzy or fainting spells. Alcohol can make you more drowsy and dizzy. Avoid alcoholic drinks. This medicine can affect blood sugar levels. If you have diabetes, check with your doctor or health care professional before you change your diet or the dose of your diabetic medicine. Do not treat yourself for coughs, colds, or pain while you are taking this medicine without asking your doctor or health care professional for advice. Some ingredients may increase your blood pressure. What side effects may I notice from receiving this medicine? Side effects that you should report to your doctor or health care professional as soon as possible: -allergic reactions like skin rash, itching or hives, swelling of the face, lips, or tongue -breathing problems -changes in blood sugar -cold hands or feet -difficulty sleeping, nightmares -dry peeling skin -hallucinations -muscle cramps or weakness -slow heart rate -swelling of the legs and ankles -vomiting Side effects that usually do not require medical attention (report to your doctor or health care professional if they continue or are bothersome): -change in sex drive or performance -diarrhea -dry sore eyes -hair loss -nausea -weak or tired This list may not describe all possible side effects. Call your doctor for medical advice about side effects. You may report side effects to FDA at 1-800-FDA-1088. Where should I keep my medicine? Keep out of the reach of children. Store at room temperature between 15 and 30 degrees C (59 and 86 degrees F). Protect from light. Throw away any unused medicine after the expiration  date. NOTE: This sheet is a summary. It may not cover all possible information. If you have questions about this medicine, talk to your doctor, pharmacist, or health care provider.  2015, [employer] Standard. (2012-12-15 14:51:53)

## 2013-12-06 DIAGNOSIS — R21 Rash and other nonspecific skin eruption: Secondary | ICD-10-CM | POA: Insufficient documentation

## 2013-12-06 DIAGNOSIS — R51 Headache: Secondary | ICD-10-CM

## 2013-12-06 DIAGNOSIS — Z0000 Encounter for general adult medical examination without abnormal findings: Secondary | ICD-10-CM | POA: Insufficient documentation

## 2013-12-06 DIAGNOSIS — R519 Headache, unspecified: Secondary | ICD-10-CM | POA: Insufficient documentation

## 2013-12-06 NOTE — Assessment & Plan Note (Signed)
Her systolic murmur is present but I hear a clear S1 and S2.  She denies dyspnea, chest pain or syncope.  Since her AS is mild and she is asymptomatic I will hold off on referring for repeat ECHO today but she will need an ECHO before 09/2015.

## 2013-12-06 NOTE — Assessment & Plan Note (Signed)
BP Readings from Last 3 Encounters:  12/05/13 140/75  11/14/13 127/68  06/28/13 112/72    Lab Results  Component Value Date   NA 144 11/14/2013   K 4.0 11/14/2013   CREATININE 0.87 11/14/2013    Assessment: Blood pressure control:  well controlled Progress toward BP goal:   at goal (< 150/90 given her age)  Plan: Medications:  continue current medications:  Norvasc 10mg  daily, Lasix 60mg  daily Educational resources provided: brochure Self management tools provided: home blood pressure logbook Other plans: I have added propranolol 20mg  TID today for migraine and situational anxiety prophylaxis.  This may bring her BP down slightly and if so medications may need to be adjusted.

## 2013-12-06 NOTE — Assessment & Plan Note (Signed)
Thickening and darkening of skin of the lower left leg and + 1 edema c/w stasis dermatitis and unchanged from exam a few weeks ago.  There are no ulcerations present.  The patient confirms that the swelling, pain and appearance of the leg are chronic and her symptoms have not acutely changed.  She reports compliance with Lasix and compression stockings (but is not wearing her stockings today). - I have encouraged her to elevate her legs when seated and to wear her compression stockings consistently. - Renal function is good.  She will continue Lasix 60mg  daily (refilled today).

## 2013-12-06 NOTE — Assessment & Plan Note (Addendum)
She has c/o of chronic headaches x 8-9 years. Reports being told by a doctor that she has migraines. Pain is B/L and 8/10 at worst.  She denies N/V but reports photophobia during the events.  This is a chronic problem, she has no visual symptoms and does not exhibit temporal tenderness to suggest GCA.  She was taking Aleve for chronic pain including headache pain but stopped a few weeks ago 2/2 to melena.  She is using Tylenol now but says it does not help.  The chronicity and unchanged nature of the headaches makes me less concerned for acute processes.   - will start propranolol 20mg  TID (for migraine prophylaxis and situational anxiety); starting at a lower dose given her age; if she tolerates will likely increase to 40mg  TID - [facility]

## 2013-12-06 NOTE — Progress Notes (Signed)
Case discussed with Dr. Wilson at the time of the visit.  We reviewed the resident's history and exam and pertinent patient test results.  I agree with the assessment, diagnosis and plan of care documented in the resident's note. 

## 2013-12-06 NOTE — Assessment & Plan Note (Signed)
PPI was discontinued by surgeon a few weeks after Nissen fundoplication.  She started having heartburn again a few weeks ago and has started OTC Nexium daily.  Otherwise, symptoms have improved s/p surgery.  She will follow-up with Dr. Arlyce DiceKaplan (GI) on 08/18 and she plans to schedule follow-up with Dr. Daphine DeutscherMartin (Gen surg).  She will continue OTC Nexium unless instructed differently by Dr. Arlyce DiceKaplan or Dr. Daphine DeutscherMartin.

## 2013-12-06 NOTE — Assessment & Plan Note (Signed)
Rx for Zostavax provided at this visit and patient informed she can take it to any pharmacy.

## 2013-12-06 NOTE — Assessment & Plan Note (Signed)
Rash on left forearm x a few weeks.  She says it is pruritic.  There are small papules on the forearm and the arm looks dry, no erythema, excoriation or skin breakdown.  She has been using an OTC cream and she says it is improving.  She also notes similar rash behind neck has since completely resolved and she feels it was due to a long wig that caused irritation.  The rash on her arm likely represents a similar contact reaction. - will follow-up at next visit

## 2013-12-06 NOTE — Assessment & Plan Note (Addendum)
She says her "nerves are shot."  Anxious/nervousnes comes and goes.  This time it has been present for about 2 months off-and-on.  She cannot identify any triggers.  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  Says she had another episode of nervousness that evening at home.  She took two Tylenol to rest.  No new medications.  Compliant with citalopram 40mg .  She denies current use of benzos.  She has chronic issues with insomnia but no change in appetite or anhedonia.  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  She denies SI/HI.  She has seen psychiatry before but declines referral for psychiatry or counseling today.  She says she will be all right.   - continue citalopram 40mg   - start propranolol 20mg  TID (TID dosing for concurrent migraine prophylaxis; starting with lower dose given her age); I will see how she tolerates this dose and will likely increase to 40mg  TID after a few weeks if she finds it beneficial. - if she knows a public event is coming up where she may have increased anxiety, she was instructed to take one of the propranolol doses prior to the event - [facility]

## 2013-12-10 ENCOUNTER — Ambulatory Visit: Payer: Medicare HMO | Admitting: Gastroenterology

## 2013-12-21 ENCOUNTER — Encounter (INDEPENDENT_AMBULATORY_CARE_PROVIDER_SITE_OTHER): Payer: Self-pay | Admitting: Surgery

## 2014-01-09 ENCOUNTER — Ambulatory Visit (INDEPENDENT_AMBULATORY_CARE_PROVIDER_SITE_OTHER): Payer: Medicare HMO | Admitting: Internal Medicine

## 2014-01-09 ENCOUNTER — Encounter: Payer: Self-pay | Admitting: Internal Medicine

## 2014-01-09 VITALS — BP 118/70 | HR 66 | Temp 98.1°F | Ht 60.0 in | Wt 121.0 lb

## 2014-01-09 DIAGNOSIS — Z0000 Encounter for general adult medical examination without abnormal findings: Secondary | ICD-10-CM

## 2014-01-09 DIAGNOSIS — I10 Essential (primary) hypertension: Secondary | ICD-10-CM

## 2014-01-09 DIAGNOSIS — Z13820 Encounter for screening for osteoporosis: Secondary | ICD-10-CM

## 2014-01-09 DIAGNOSIS — Z23 Encounter for immunization: Secondary | ICD-10-CM

## 2014-01-09 DIAGNOSIS — F419 Anxiety disorder, unspecified: Secondary | ICD-10-CM

## 2014-01-09 DIAGNOSIS — F411 Generalized anxiety disorder: Secondary | ICD-10-CM

## 2014-01-09 DIAGNOSIS — G8929 Other chronic pain: Secondary | ICD-10-CM

## 2014-01-09 DIAGNOSIS — R51 Headache: Secondary | ICD-10-CM

## 2014-01-09 MED ORDER — PROPRANOLOL HCL 40 MG PO TABS: 40.0000 mg | ORAL_TABLET | Freq: Three times a day (TID) | ORAL | Status: DC

## 2014-01-09 NOTE — Progress Notes (Signed)
   Subjective:    Patient ID: Sherri Mcmillan, female    DOB: 1932/11/10, 80 y.o.   MRN: [id number]  HPI Comments: Sherri Mcmillan is a 80 year old woman with a PMH of GERD, hiatal hernia (s/p Nissen's fundoplication), esophageal stricture (s/p dilatation), hx of DVT (last year, off coumadin for over 1 year), AS, HTN.  She is here for follow-up of situational anxiety and migraine.  She says she is she is still having headache but pain has improved.  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       Review of Systems  Constitutional: Negative for appetite change and unexpected weight change.  Respiratory: Negative for shortness of breath.   Cardiovascular: Positive for palpitations. Negative for chest pain and leg swelling.  Gastrointestinal: Negative for nausea, vomiting, diarrhea and constipation.  Neurological: Positive for headaches. Negative for tremors.  Psychiatric/Behavioral: Positive for sleep disturbance.       Objective:   Physical Exam  Vitals reviewed. Constitutional: She is oriented to person, place, and time. She appears well-developed and well-nourished. No distress.  HENT:  Mouth/Throat: Oropharynx is clear and moist. No oropharyngeal exudate.  Eyes: EOM are normal.  Cardiovascular: Normal rate and regular rhythm.  Exam reveals no gallop and no friction rub.   Murmur heard. +S1S2  Pulmonary/Chest: Effort normal and breath sounds normal. No respiratory distress. She has no wheezes. She has no rales.  Abdominal: Soft. Bowel sounds are normal. She exhibits no distension. There is no tenderness.  Musculoskeletal: Normal range of motion. She exhibits no edema and no tenderness.  Neurological: She is alert and oriented to person, place, and time. No cranial nerve deficit.  Skin: Skin is warm. She is not diaphoretic.  Psychiatric: She has a normal mood and affect. Her behavior is normal.          Assessment &  Plan:  Please see problem based assessment and plan.

## 2014-01-09 NOTE — Assessment & Plan Note (Signed)
She got the shingles vaccine from a local pharmacy the day following our last visit.  She was given the flu vaccine today.  She is agreeable to DEXA screening so I will place order today.

## 2014-01-09 NOTE — Patient Instructions (Addendum)
1. We will call you with appointment date for your DEXA scan to check your bones.   2. Please take all medications as prescribed.    3. If you have worsening of your symptoms or new symptoms arise, please call the clinic ([phone]), or go to the ER immediately if symptoms are severe.   Please return to see me in 3 months or sooner if you need to.

## 2014-01-09 NOTE — Assessment & Plan Note (Signed)
BP Readings from Last 3 Encounters:  01/09/14 118/70  12/05/13 140/75  11/14/13 127/68    Lab Results  Component Value Date   NA 144 11/14/2013   K 4.0 11/14/2013   CREATININE 0.87 11/14/2013    Assessment: Blood pressure control:  well controlled  Progress toward BP goal:   at goal Comments:  No ADRs with addition of propranolol.  BP and HR stable.  Plan: Medications:  continue current medications:  Norvasc  daily, Lasix  daily, propranolol  TID (for migraine ppx and situational anxiety) Other plans: She was advised to call clinic if she begins to feels lightheaded/orthostatic or has any new symptoms on increased dose of propranolol.  BP check at next visit.

## 2014-01-09 NOTE — Assessment & Plan Note (Addendum)
Improvement in situational anxiety with use of propranolol.  She has not experienced ADRs.  The med is also helping her headaches.  Her HR and BP are stable.   - increase propranolol to  TID - continue citalopram

## 2014-01-09 NOTE — Assessment & Plan Note (Signed)
Still with h/a but decreased pain.  She is agreeable to increasing propranolol. - increase to propranolol  TID

## 2014-01-10 NOTE — Progress Notes (Signed)
INTERNAL MEDICINE TEACHING ATTENDING ADDENDUM -  , MD: I reviewed and discussed at the time of visit with the resident Dr. Wilson, the patient's medical history, physical examination, diagnosis and results of pertinent tests and treatment and I agree with the patient's care as documented.  

## 2014-01-16 ENCOUNTER — Other Ambulatory Visit: Payer: Self-pay | Admitting: Internal Medicine

## 2014-01-16 DIAGNOSIS — Z780 Asymptomatic menopausal state: Secondary | ICD-10-CM

## 2014-01-22 ENCOUNTER — Telehealth (INDEPENDENT_AMBULATORY_CARE_PROVIDER_SITE_OTHER): Payer: Self-pay

## 2014-01-22 NOTE — Telephone Encounter (Signed)
Pt called to report she is having bad reflux like she did before surgery. She is s/p Nissen 05/02/13.  The spit up is coming into mouth.  She reports she has head of bed elevated.  The Nexium has quit working.  Can Dr Daphine DeutscherMartin call her in another medication.  She has 10/23 appt. We will send note to Dr. and someone will contact her when we receive response.  Pt verbalized understanding. Will place this message in epic as well. SL

## 2014-01-28 ENCOUNTER — Telehealth: Payer: Self-pay | Admitting: *Deleted

## 2014-01-28 NOTE — Telephone Encounter (Signed)
RTC to pt about appointment to [facility] for her Dexa Scan.  Pt has an apppoinment on 01/30/2014 at 11:00 AM pt to arrive by 10:45 AM.  Pt to hold Calcium if she is taking starting tomorrow and until her procedure is complete.  Voiced understanding of the plan.  Angelina OkGladys , RN 01/28/2014 3:04 PM

## 2014-01-30 ENCOUNTER — Ambulatory Visit
Admission: RE | Admit: 2014-01-30 | Discharge: 2014-01-30 | Disposition: A | Discharge status: Home / Self Care | Payer: Medicare HMO | Source: Ambulatory Visit | Attending: Oncology | Admitting: Oncology

## 2014-01-30 DIAGNOSIS — Z780 Asymptomatic menopausal state: Secondary | ICD-10-CM

## 2014-02-02 ENCOUNTER — Emergency Department (HOSPITAL_COMMUNITY): Payer: Medicare HMO

## 2014-02-02 ENCOUNTER — Encounter (HOSPITAL_COMMUNITY): Payer: Self-pay | Admitting: Emergency Medicine

## 2014-02-02 ENCOUNTER — Emergency Department (HOSPITAL_COMMUNITY)
Admission: EM | Admit: 2014-02-02 | Discharge: 2014-02-02 | Disposition: A | Discharge status: Home / Self Care | Payer: Medicare HMO | Attending: Emergency Medicine | Admitting: Emergency Medicine

## 2014-02-02 DIAGNOSIS — H9209 Otalgia, unspecified ear: Secondary | ICD-10-CM | POA: Diagnosis not present

## 2014-02-02 DIAGNOSIS — R1111 Vomiting without nausea: Secondary | ICD-10-CM | POA: Diagnosis not present

## 2014-02-02 DIAGNOSIS — F419 Anxiety disorder, unspecified: Secondary | ICD-10-CM | POA: Diagnosis not present

## 2014-02-02 DIAGNOSIS — R042 Hemoptysis: Secondary | ICD-10-CM | POA: Diagnosis not present

## 2014-02-02 DIAGNOSIS — M7989 Other specified soft tissue disorders: Secondary | ICD-10-CM | POA: Diagnosis not present

## 2014-02-02 DIAGNOSIS — Z862 Personal history of diseases of the blood and blood-forming organs and certain disorders involving the immune mechanism: Secondary | ICD-10-CM | POA: Diagnosis not present

## 2014-02-02 DIAGNOSIS — R918 Other nonspecific abnormal finding of lung field: Secondary | ICD-10-CM | POA: Diagnosis not present

## 2014-02-02 DIAGNOSIS — J40 Bronchitis, not specified as acute or chronic: Secondary | ICD-10-CM

## 2014-02-02 DIAGNOSIS — Z87891 Personal history of nicotine dependence: Secondary | ICD-10-CM | POA: Insufficient documentation

## 2014-02-02 DIAGNOSIS — Z79899 Other long term (current) drug therapy: Secondary | ICD-10-CM | POA: Insufficient documentation

## 2014-02-02 DIAGNOSIS — G8929 Other chronic pain: Secondary | ICD-10-CM | POA: Diagnosis not present

## 2014-02-02 DIAGNOSIS — K219 Gastro-esophageal reflux disease without esophagitis: Secondary | ICD-10-CM | POA: Diagnosis not present

## 2014-02-02 DIAGNOSIS — J029 Acute pharyngitis, unspecified: Secondary | ICD-10-CM | POA: Diagnosis present

## 2014-02-02 DIAGNOSIS — G43909 Migraine, unspecified, not intractable, without status migrainosus: Secondary | ICD-10-CM | POA: Diagnosis not present

## 2014-02-02 DIAGNOSIS — Z86718 Personal history of other venous thrombosis and embolism: Secondary | ICD-10-CM | POA: Diagnosis not present

## 2014-02-02 DIAGNOSIS — I10 Essential (primary) hypertension: Secondary | ICD-10-CM | POA: Diagnosis not present

## 2014-02-02 DIAGNOSIS — E876 Hypokalemia: Secondary | ICD-10-CM | POA: Diagnosis not present

## 2014-02-02 DIAGNOSIS — M79609 Pain in unspecified limb: Secondary | ICD-10-CM

## 2014-02-02 DIAGNOSIS — K2270 Barrett's esophagus without dysplasia: Secondary | ICD-10-CM | POA: Insufficient documentation

## 2014-02-02 DIAGNOSIS — R5383 Other fatigue: Secondary | ICD-10-CM | POA: Diagnosis not present

## 2014-02-02 DIAGNOSIS — J209 Acute bronchitis, unspecified: Secondary | ICD-10-CM | POA: Diagnosis not present

## 2014-02-02 LAB — BASIC METABOLIC PANEL
Anion gap: 11 (ref 5–15)
BUN: 16 mg/dL (ref 6–23)
CALCIUM: 9.4 mg/dL (ref 8.4–10.5)
CO2: 30 meq/L (ref 19–32)
CREATININE: 0.83 mg/dL (ref 0.50–1.10)
Chloride: 101 mEq/L (ref 96–112)
GFR calc Af Amer: 76 mL/min — ABNORMAL LOW (ref >90)
GFR calc non Af Amer: 65 mL/min — ABNORMAL LOW (ref >90)
GLUCOSE: 93 mg/dL (ref 70–99)
Potassium: 3.3 mEq/L — ABNORMAL LOW (ref 3.7–5.3)
Sodium: 142 mEq/L (ref 137–147)

## 2014-02-02 LAB — CBC WITH DIFFERENTIAL/PLATELET
BASOS ABS: 0 10*3/uL (ref 0.0–0.1)
Basophils Relative: 0 % (ref 0–1)
EOS PCT: 2 % (ref 0–5)
Eosinophils Absolute: 0.1 10*3/uL (ref 0.0–0.7)
HEMATOCRIT: 31.6 % — AB (ref 36.0–46.0)
Hemoglobin: 9.8 g/dL — ABNORMAL LOW (ref 12.0–15.0)
LYMPHS ABS: 1.3 10*3/uL (ref 0.7–4.0)
Lymphocytes Relative: 29 % (ref 12–46)
MCH: 25.9 pg — ABNORMAL LOW (ref 26.0–34.0)
MCHC: 31 g/dL (ref 30.0–36.0)
MCV: 83.4 fL (ref 78.0–100.0)
MONO ABS: 0.4 10*3/uL (ref 0.1–1.0)
Monocytes Relative: 9 % (ref 3–12)
Neutro Abs: 2.7 10*3/uL (ref 1.7–7.7)
Neutrophils Relative %: 60 % (ref 43–77)
Platelets: 142 10*3/uL — ABNORMAL LOW (ref 150–400)
RBC: 3.79 MIL/uL — ABNORMAL LOW (ref 3.87–5.11)
RDW: 14.4 % (ref 11.5–15.5)
WBC: 4.6 10*3/uL (ref 4.0–10.5)

## 2014-02-02 LAB — RAPID STREP SCREEN (MED CTR MEBANE ONLY): Streptococcus, Group A Screen (Direct): NEGATIVE

## 2014-02-02 MED ORDER — IPRATROPIUM-ALBUTEROL 0.5-2.5 (3) MG/3ML IN SOLN
3.0000 mL | RESPIRATORY_TRACT | Status: DC
  Administered 2014-02-02: 3 mL via RESPIRATORY_TRACT
  Filled 2014-02-02: qty 3

## 2014-02-02 MED ORDER — KETOROLAC TROMETHAMINE 15 MG/ML IJ SOLN
15.0000 mg | Freq: Once | INTRAMUSCULAR | Status: AC
  Administered 2014-02-02: 15 mg via INTRAVENOUS
  Filled 2014-02-02: qty 1

## 2014-02-02 MED ORDER — LIDOCAINE VISCOUS 2 % MT SOLN: 20.0000 mL | OROMUCOSAL | Status: DC | PRN

## 2014-02-02 MED ORDER — IBUPROFEN 800 MG PO TABS: 800.0000 mg | ORAL_TABLET | Freq: Three times a day (TID) | ORAL | Status: DC

## 2014-02-02 MED ORDER — BENZONATATE 100 MG PO CAPS: 100.0000 mg | ORAL_CAPSULE | Freq: Three times a day (TID) | ORAL | Status: DC

## 2014-02-02 MED ORDER — IOHEXOL 350 MG/ML SOLN
80.0000 mL | Freq: Once | INTRAVENOUS | Status: AC | PRN
  Administered 2014-02-02: 80 mL via INTRAVENOUS

## 2014-02-02 MED ORDER — FENTANYL CITRATE 0.05 MG/ML IJ SOLN
25.0000 ug | Freq: Once | INTRAMUSCULAR | Status: AC
  Administered 2014-02-02: 25 ug via INTRAVENOUS
  Filled 2014-02-02: qty 2

## 2014-02-02 MED ORDER — ALBUTEROL SULFATE HFA 108 (90 BASE) MCG/ACT IN AERS: 1.0000 | INHALATION_SPRAY | Freq: Four times a day (QID) | RESPIRATORY_TRACT | Status: DC | PRN

## 2014-02-02 NOTE — ED Notes (Signed)
PA at bedside.

## 2014-02-02 NOTE — ED Provider Notes (Signed)
Medical screening examination/treatment/procedure(s) were conducted as a shared visit with non-physician practitioner(s) and myself.  I personally evaluated the patient during the encounter.  Please see separate associated note for evaluation and plan.    EKG Interpretation None       Gilda Creasehristopher J. Pollina, MD 02/02/14 [id number]

## 2014-02-02 NOTE — ED Notes (Signed)
Sats 100% walking around hallway

## 2014-02-02 NOTE — ED Notes (Signed)
Pt. Stated, I've had a cough and sore throat for 3 days.

## 2014-02-02 NOTE — ED Notes (Addendum)
Readjusted SPO monitor and O2 was 98%

## 2014-02-02 NOTE — ED Provider Notes (Signed)
Patient presented to the ER with sore throat, cough, congestion. Cough productive of yellow sputum.  Face to face Exam: HEENT - PERRLA Lungs - CTAB Heart - RRR, no M/R/G Abd - S/NT/ND Neuro - alert, oriented x3  Plan: Patient with cough. Has had sputum production, including blood tinged. Has swelling of leg, h/o DVT. Duplex negative for DVT. CXR/CT angio, rule out pneumonia and PE.   Gilda Creasehristopher J. Pollina, MD 02/02/14 [phone]

## 2014-02-02 NOTE — ED Provider Notes (Signed)
CSN: [id number]     Arrival date & time 02/02/14  0914 History   First MD Initiated Contact with Patient 02/02/14 1110     Chief Complaint  Patient presents with  . Cough  . Sore Throat   Patient is a 80 y.o. female presenting with cough and pharyngitis.  Cough Associated symptoms: ear pain and myalgias   Associated symptoms: no chest pain, no chills, no fever, no rash, no shortness of breath and no wheezing   Sore Throat Associated symptoms include coughing, fatigue, myalgias and vomiting. Pertinent negatives include no abdominal pain, chest pain, chills, fever, nausea or rash.    Patient is a 80 y.o. Female who presents to the ED with cough, sore throat, and myalgias x 1 week.  Patient states that she has had a worsening cough for the past week with production of yellow sputum with bloody streaks.  Cough is worse at night and is keeping the patient awake.  Patient states that she has some associated congestion, runny nose, and myalgias.  Patient states that she has had some post tussive vomiting and some nausea.  Patient states that she has tried over the counter cough syrups and sudafed with no relief.  Patient denies any sick contacts.  Patient is a never smoker.  Patient does admit to a history of DVT 2 years ago.  She is no longer on any type of blood thinner.  Patient states that she is due to see her doctor in 2 weeks for a repeat doppler of her leg.    Past Medical History  Diagnosis Date  . Chronic pain     DJD knees, back, migranes, LLE varicose veins, and LLE neuropathy.   . Aortic stenosis     Dx ECHO 2008 , mild and aymptomatic , needs ECHO q 3-5 yrs  . Barrett's esophagus     Demonstrated on EGD 12/2010. EGD 09/17/2012 shows inflamed GE junctional mucosa without metaplasia, dysplasia, or malignancy.  . Chronic insomnia   . Chronic anxiety     [facility]  for 6 weeks after mother died. Complicated again by death of her sister 2010. 2012 developed  hallucinations and I refused to refill controlled meds unless she see psych which she is not agreable to  . Elevated cholesterol 10/11    LDL 155. Her 10 year risk (decreasing her age to [age] as the calculator won't go to age 80) is 21% ish. If consider her non smoker (which I wouldn't even though she says 1 pak lasts 2 weeks) risk is 12% ish. So goal for LDL is 130 or 100.  Marland Kitchen. HTN (hypertension)     Controlled with 2 drug therapy  . Cataract   . Depression   . Gastroparesis     Demonstrated on GES 12/2010 by Dr Dalene SeltzerKapland  . Bronchitis     hx of  . Left leg DVT 09/28/2012  . Anemia 12/14/2012  . GERD (gastroesophageal reflux disease)   . H/O hiatal hernia   . Migraine     sometimes   Past Surgical History  Procedure Laterality Date  . Direct laryngoscopy   September 2008     preoperative diagnosis hoarseness with anterior right vocal cord lesion -  direct laryngoscopy and excisional biopsy of right anterior vocal cord lesion done by Dr. Ezzard StandingNewman  . Meniscectomy   July 2002     preoperative diagnosis torn medial meniscus right knee, partial medial meniscectomy, debridement chondroplasty patellofemoral joint, done  by Dr. Madelon Lipsaffrey  . Abdominal hysterectomy    . Hemorrhoid surgery    . Appendectomy    . Cataract extraction      left eye  . Polypectomy  2000    Dr.Magod  . Colonoscopy  2000&2005    Dr.Magod  . Esophagogastroduodenoscopy  11/2010  . Knee arthroscopy    . Eye surgery    . Rotator cuff repair  09/21/2011    rt shoulder  . Esophagogastroduodenoscopy N/A 09/17/2012    Procedure: ESOPHAGOGASTRODUODENOSCOPY (EGD);  Surgeon: Hart Carwinora M Brodie, MD;  [facility];  Service: Endoscopy;  Laterality: N/A;  . Esophagogastroduodenoscopy N/A 12/17/2012    Procedure: ESOPHAGOGASTRODUODENOSCOPY (EGD);  Surgeon: Beverley FiedlerJay M Pyrtle, MD;  [facility];  Service: Gastroenterology;  Laterality: N/A;  . Laparoscopic nissen fundoplication N/A 05/02/2013    Procedure: LAPAROSCOPIC NISSEN  FUNDOPLICATION;  Surgeon: Valarie MerinoMatthew B Martin, MD;  Location: [facility] ORS;  Service: General;  Laterality: N/A;   Family History  Problem Relation Age of Onset  . Stroke Neg Hx   . Cancer Neg Hx   . Colon cancer Neg Hx   . Anesthesia problems Neg Hx   . Hypotension Neg Hx   . Malignant hyperthermia Neg Hx   . Pseudochol deficiency Neg Hx   . Heart disease Mother   . Hypertension Mother    History  Substance Use Topics  . Smoking status: Former Smoker -- 50 years    Types: Cigarettes    Quit date: 06/01/2010  . Smokeless tobacco: Never Used  . Alcohol Use: No     Comment: quit 24 years ago   OB History   Grav Para Term Preterm Abortions TAB SAB Ect Mult Living                 Review of Systems  Constitutional: Positive for fatigue. Negative for fever and chills.  HENT: Positive for ear pain. Negative for ear discharge.   Respiratory: Positive for cough. Negative for choking, chest tightness, shortness of breath and wheezing.   Cardiovascular: Positive for leg swelling (chronic left leg swelling). Negative for chest pain and palpitations.  Gastrointestinal: Positive for vomiting. Negative for nausea, abdominal pain, diarrhea and constipation.  Genitourinary: Negative for dysuria, urgency, frequency, hematuria and difficulty urinating.  Musculoskeletal: Positive for myalgias. Negative for gait problem.  Skin: Negative for rash.  All other systems reviewed and are negative.     Allergies  Aspirin  Home Medications   Prior to Admission medications   Medication Sig Start Date End Date Taking? Authorizing Provider  albuterol (PROVENTIL HFA;VENTOLIN HFA) 108 (90 BASE) MCG/ACT inhaler Inhale 2 puffs into the lungs every 4 (four) hours as needed for wheezing or shortness of breath. 03/30/13  Yes Arthor CaptainAbigail Harris, PA-C  amLODipine (NORVASC) 10 MG tablet Take 1 tablet (10 mg total) by mouth daily with breakfast. 12/05/13  Yes Yolanda MangesAlex M Wilson, DO  citalopram (CELEXA) 40 MG tablet Take 40 mg  by mouth daily.   Yes Historical Provider, MD  docusate sodium ([employer] STOOL SOFTENER) 100 MG capsule Take 100 mg by mouth daily as needed for mild constipation.   Yes Historical Provider, MD  esomeprazole (NEXIUM) 20 MG capsule Take 20 mg by mouth daily at 12 noon.   Yes Historical Provider, MD  furosemide (LASIX) 40 MG tablet Take 1.5 tablets (60 mg total) by mouth daily with breakfast. 12/05/13  Yes Yolanda MangesAlex M Wilson, DO  propranolol (INDERAL) 40 MG tablet Take 1 tablet (40 mg total) by mouth  3 (three) times daily. 01/09/14  Yes [name], DO  albuterol (PROVENTIL HFA;VENTOLIN HFA) 108 (90 BASE) MCG/ACT inhaler Inhale 1-2 puffs into the lungs every 6 (six) hours as needed for wheezing or shortness of breath. 02/02/14    A Forcucci, PA-C  benzonatate (TESSALON) 100 MG capsule Take 1 capsule (100 mg total) by mouth every 8 (eight) hours. 02/02/14    A Forcucci, PA-C  ibuprofen (ADVIL,MOTRIN) 800 MG tablet Take 1 tablet (800 mg total) by mouth 3 (three) times daily. 02/02/14    A Forcucci, PA-C  lidocaine (XYLOCAINE) 2 % solution Use as directed 20 mLs in the mouth or throat as needed for mouth pain. 02/02/14    A Forcucci, PA-C   BP 151/97  Pulse 65  Temp(Src) 98.3 F (36.8 C) (Oral)  Resp 18  Ht 5' (1.524 m)  Wt 120 lb (54.432 kg)  BMI 23.44 kg/m2  SpO2 100%  LMP 04/26/1950 Physical Exam  ED Course  Procedures (including critical care time) Labs Review Labs Reviewed  CBC WITH DIFFERENTIAL - Abnormal; Notable for the following:    RBC 3.79 (*)    Hemoglobin 9.8 (*)    HCT 31.6 (*)    MCH 25.9 (*)    Platelets 142 (*)    All other components within normal limits  BASIC METABOLIC PANEL - Abnormal; Notable for the following:    Potassium 3.3 (*)    GFR calc non Af Amer 65 (*)    GFR calc Af Amer 76 (*)    All other components within normal limits  RAPID STREP SCREEN  CULTURE, GROUP A STREP    Imaging Review Dg Chest 2 View  02/02/2014   CLINICAL  DATA:  Cough, sore throat and headache.  EXAM: CHEST - 2 VIEW  COMPARISON:  03/30/2013  FINDINGS: The heart size and mediastinal contours are within normal limits. There is no evidence of pulmonary edema, consolidation, pneumothorax, nodule or pleural fluid. The visualized skeletal structures are unremarkable.  IMPRESSION: No active disease.   Electronically Signed   By: Irish Lack M.D.   On: 02/02/2014 13:26   Ct Angio Chest Pe W/cm &/or Wo Cm  02/02/2014   CLINICAL DATA:  Difficulty breathing and hemoptysis  EXAM: CT ANGIOGRAPHY CHEST WITH CONTRAST  TECHNIQUE: Multidetector CT imaging of the chest was performed using the standard protocol during bolus administration of intravenous contrast. Multiplanar CT image reconstructions and MIPs were obtained to evaluate the vascular anatomy.  CONTRAST:  80mL OMNIPAQUE IOHEXOL 350 MG/ML SOLN  COMPARISON:  Chest radiograph February 02, 2014; chest CT May 14, 2008  FINDINGS: There is no demonstrable pulmonary embolus. There is atherosclerotic change in aorta. There is no thoracic aortic aneurysm. Pericardium is not thickened.  There is apical pleural thickening bilaterally. On axial slice 15 series 6, there is a 3 mm nodular opacity in the posterior right upper lobe near the apex. On axial slice 16 series 6, there is a 5 mm nodular opacity in the posterior right upper lobe near the apex. On axial slice 25 series 6, there is a 3 mm nodular opacity in the posterior segment of the right upper lobe. There is slight bibasilar atelectasis. There is slight lower lobe bronchiectasis bilaterally. There is no appreciable edema or consolidation.  There is no demonstrable thoracic adenopathy. There is a fairly small hiatal hernia. There are surgical clips at the gastroesophageal junction.  In the visualized upper abdomen there are gallstones in the gallbladder. The gallbladder wall is  not thickened. There is atherosclerotic change in the aorta.  Thyroid appears normal.  There  are no blastic or lytic bone lesions.  Review of the MIP images confirms the above findings.  IMPRESSION: No demonstrable pulmonary embolus. Nodular opacities in the lungs as noted above. Followup of these nodular opacity should be based on Fleischner Society guidelines. If the patient is at high risk for bronchogenic carcinoma, follow-up chest CT at 6-12 months is recommended. If the patient is at low risk for bronchogenic carcinoma, follow-up chest CT at 12 months is recommended. This recommendation follows the consensus statement: Guidelines for Management of Small Pulmonary Nodules Detected on CT Scans: A Statement from the Fleischner Society as published in Radiology 2005;237:395-400.  No edema or consolidation. Slight lower lobe bronchiectatic change bilaterally.  There is a hiatal hernia.  There is cholelithiasis.   Electronically Signed   By: Bretta Bang M.D.   On: 02/02/2014 14:26     EKG Interpretation None      MDM   Final diagnoses:  Hemoptysis  Bronchitis  Multiple lung nodules on CT   Patient is a 80 y.o. Female who presents to the ED with shortness of breath, cough, and sore throat.  Physical exam reveals an alert nontoxic appearing female in NAD.  Lungs had some mild wheezing and the patient was treated here with a duoneb.  CBC reveals normal white count and baseline anemia.  BMP reveals mild hypokalemia.  Rapid strep is negative.  CXR is without mass or acute consolidations.  Dopplers of the left leg are negative.  Strep culture is pending.  CT angio was performed given wells criteria of 5.5 with 16% chance of PE.  CT was negative for PE but did show nodular oppacities which radiology has requested that the patient have repeat CT in 6-12 months.  Suspect that this is viral URI vs. Bronchitis.  Patient able to ambulate with a pulse ox of 100%.  Will treat the patient symptomatically at this time.  Will discharge the patient home with albuterol inhaler, tessalon pearls, ibuprofen,  and have suggested using nasal saline in her nose.  Patient was offered prednisone burst which she declined.  Patient was told to return to the ED for worsening shortness of breath, fever, or any other concerning symptom.  Patient is stable for discharge.       Eben Burow, PA-C 02/02/14 1532  Eben Burow, PA-C 02/02/14 [id number]

## 2014-02-02 NOTE — ED Notes (Addendum)
Pt off floor with CT

## 2014-02-02 NOTE — Discharge Instructions (Signed)

## 2014-02-02 NOTE — Progress Notes (Signed)
CT ordered Stat @ 1130- Still waiting Lab results [facility] chem panel [facility]

## 2014-02-02 NOTE — Progress Notes (Signed)
*  PRELIMINARY RESULTS* Vascular Ultrasound Left lower extremity venous duplex has been completed.  Preliminary findings: Left:  No evidence of DVT, superficial thrombosis, or Baker's cyst.   Farrel DemarkJill Eunice, RDMS, RVT  02/02/2014, 11:56 AM

## 2014-02-04 LAB — CULTURE, GROUP A STREP

## 2014-02-08 ENCOUNTER — Encounter: Payer: Self-pay | Admitting: Internal Medicine

## 2014-02-08 ENCOUNTER — Ambulatory Visit (INDEPENDENT_AMBULATORY_CARE_PROVIDER_SITE_OTHER): Payer: Medicare HMO | Admitting: Internal Medicine

## 2014-02-08 VITALS — BP 143/92 | HR 49 | Temp 98.1°F | Wt 123.6 lb

## 2014-02-08 DIAGNOSIS — R05 Cough: Secondary | ICD-10-CM

## 2014-02-08 DIAGNOSIS — R059 Cough, unspecified: Secondary | ICD-10-CM

## 2014-02-08 DIAGNOSIS — R911 Solitary pulmonary nodule: Secondary | ICD-10-CM

## 2014-02-08 LAB — CBC WITH DIFFERENTIAL/PLATELET
BASOS ABS: 0 10*3/uL (ref 0.0–0.1)
Basophils Relative: 1 % (ref 0–1)
Eosinophils Absolute: 0 10*3/uL (ref 0.0–0.7)
Eosinophils Relative: 1 % (ref 0–5)
HCT: 30.7 % — ABNORMAL LOW (ref 36.0–46.0)
Hemoglobin: 9.6 g/dL — ABNORMAL LOW (ref 12.0–15.0)
Lymphocytes Relative: 43 % (ref 12–46)
Lymphs Abs: 1.7 10*3/uL (ref 0.7–4.0)
MCH: 26.2 pg (ref 26.0–34.0)
MCHC: 31.3 g/dL (ref 30.0–36.0)
MCV: 83.9 fL (ref 78.0–100.0)
Monocytes Absolute: 0.3 10*3/uL (ref 0.1–1.0)
Monocytes Relative: 8 % (ref 3–12)
Neutro Abs: 1.8 10*3/uL (ref 1.7–7.7)
Neutrophils Relative %: 47 % (ref 43–77)
PLATELETS: 196 10*3/uL (ref 150–400)
RBC: 3.66 MIL/uL — ABNORMAL LOW (ref 3.87–5.11)
RDW: 15.7 % — AB (ref 11.5–15.5)
WBC: 3.9 10*3/uL — AB (ref 4.0–10.5)

## 2014-02-08 MED ORDER — GUAIFENESIN-DM 100-10 MG/5ML PO SYRP: 5.0000 mL | ORAL_SOLUTION | Freq: Three times a day (TID) | ORAL | Status: DC | PRN

## 2014-02-08 MED ORDER — GUAIFENESIN-CODEINE 100-10 MG/5ML PO SYRP
5.0000 mL | ORAL_SOLUTION | Freq: Three times a day (TID) | ORAL | Status: DC | PRN
Start: 2014-02-08 — End: 2014-02-18

## 2014-02-08 NOTE — Progress Notes (Signed)
Patient ID: Sherri Mcmillan, female   DOB: [birth year], 80 y.o.   MRN: [id number]   Subjective:   Patient ID: Sherri Mcmillan Mcmillan female   DOB: [birth year] 80 y.o.   MRN: [id number]  HPI: [name] is a 80 y.o. with PMH listed below. Presented today with Cough 3 weeks, productive of yellow sputum and once had streaks of blood. Has not been able to rest because of the cough. Has some SOB, present  at rest, relieved with inhaler given in the Ed when she presented with similar complaints 1 week ago. No recorded fevers, checks her temperatiure at home- usually - 97. Chest hurts- ant chest with coughing only. Quit smoking 11 years ago, smoked for about 10 years, about 2-3 packs a week. Prior to this no cough or SOB. No sick contacts, lives alone. Slight dizziness with coughing, but no falls. Sore throat has mostly resolved but is worse with coughing.   Past Medical History  Diagnosis Date  . Chronic pain     DJD knees, back, migranes, LLE varicose veins, and LLE neuropathy.   . Aortic stenosis     Dx ECHO 2008 , mild and aymptomatic , needs ECHO q 3-5 yrs  . Barrett's esophagus     Demonstrated on EGD 12/2010. EGD 09/17/2012 shows inflamed GE junctional mucosa without metaplasia, dysplasia, or malignancy.  . Chronic insomnia   . Chronic anxiety     [facility]  for 6 weeks after mother died. Complicated again by death of her sister 2010. 2012 developed hallucinations and I refused to refill controlled meds unless she see psych which she is not agreable to  . Elevated cholesterol 10/11    LDL 155. Her 10 year risk (decreasing her age to [age] as the calculator won't go to age 89) is 21% ish. If consider her non smoker (which I wouldn't even though she says 1 pak lasts 2 weeks) risk is 12% ish. So goal for LDL is 130 or 100.  Marland Kitchen. HTN (hypertension)     Controlled with 2 drug therapy  . Cataract   . Depression   . Gastroparesis     Demonstrated on GES 12/2010 by Dr Dalene SeltzerKapland  .  Bronchitis     hx of  . Left leg DVT 09/28/2012  . Anemia 12/14/2012  . GERD (gastroesophageal reflux disease)   . H/O hiatal hernia   . Migraine     sometimes   Current Outpatient Prescriptions  Medication Sig Dispense Refill  . albuterol (PROVENTIL HFA;VENTOLIN HFA) 108 (90 BASE) MCG/ACT inhaler Inhale 2 puffs into the lungs every 4 (four) hours as needed for wheezing or shortness of breath.      Marland Kitchen. albuterol (PROVENTIL HFA;VENTOLIN HFA) 108 (90 BASE) MCG/ACT inhaler Inhale 1-2 puffs into the lungs every 6 (six) hours as needed for wheezing or shortness of breath.  1 Inhaler  0  . amLODipine (NORVASC) 10 MG tablet Take 1 tablet (10 mg total) by mouth daily with breakfast.  30 tablet  2  . benzonatate (TESSALON) 100 MG capsule Take 1 capsule (100 mg total) by mouth every 8 (eight) hours.  21 capsule  0  . citalopram (CELEXA) 40 MG tablet Take 40 mg by mouth daily.      Marland Kitchen. docusate sodium ([employer]S STOOL SOFTENER) 100 MG capsule Take 100 mg by mouth daily as needed for mild constipation.      Marland Kitchen. esomeprazole (NEXIUM) 20 MG capsule Take 20 mg by mouth daily at  12 noon.      . furosemide (LASIX) 40 MG tablet Take 1.5 tablets (60 mg total) by mouth daily with breakfast.  30 tablet  2  . ibuprofen (ADVIL,MOTRIN) 800 MG tablet Take 1 tablet (800 mg total) by mouth 3 (three) times daily.  21 tablet  0  . lidocaine (XYLOCAINE) 2 % solution Use as directed 20 mLs in the mouth or throat as needed for mouth pain.  100 mL  0  . propranolol (INDERAL) 40 MG tablet Take 1 tablet (40 mg total) by mouth 3 (three) times daily.  90 tablet  1   No current facility-administered medications for this visit.   Family History  Problem Relation Age of Onset  . Stroke Neg Hx   . Cancer Neg Hx   . Colon cancer Neg Hx   . Anesthesia problems Neg Hx   . Hypotension Neg Hx   . Malignant hyperthermia Neg Hx   . Pseudochol deficiency Neg Hx   . Heart disease Mother   . Hypertension Mother    History   Social History    . Marital Status: Single    Spouse Name: N/A    Number of Children: N/A  . Years of Education: N/A   Social History Main Topics  . Smoking status: Former Smoker -- 50 years    Types: Cigarettes    Quit date: 06/01/2010  . Smokeless tobacco: Never Used  . Alcohol Use: No     Comment: quit 24 years ago  . Drug Use: No  . Sexual Activity: None   Other Topics Concern  . 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 SKIN- No Rash, colour changes or itching. HEAD- No Headache or dizziness. Mouth/throat- No Sorethroat, dentures, or bleeding gums. CARDIAC- No Palpitations, DOE, PND or chest pain. GI- No nausea, vomiting, diarrhoea, constipation, abd pain. URINARY- No Frequency, urgency, straining or dysuria. NEUROLOGIC- No Numbness, syncope, seizures or burning. [facility]- Denies depression or anxiety.  Objective:  Physical Exam: Filed Vitals:   02/08/14 1311  BP: 143/92  Pulse: 49  Temp: 98.1 F (36.7 C)  TempSrc: Oral  Weight: 123 lb 9 oz (56.046 kg)  SpO2: 100%   GENERAL- alert, co-operative, appears as stated age, not in any distress. HEENT- Atraumatic, normocephalic, PERRL, EOMI, oral mucosa appears moist, no pharyngeal erythema,  no cervical LN enlargement, thyroid does not appear enlarged, neck supple. CARDIAC- RRR, no murmurs, rubs or gallops. RESP- Moving equal volumes of air, clear to auscultation bilaterally, no wheezes or crackles, breath sounds- bronchial, with transmitted sounds from upper airway secretions. ABDOMEN- Soft, nontender, no guarding or rebound, no  palpable masses or organomegaly, bowel sounds present. BACK- Normal curvature of the spine, No tenderness along the vertebrae, no CVA tenderness. NEURO- No obvious Cr N abnormality, strenght upper and lower extremities- 5/5, Sensation intact- globally, DTRs- Normal, finger to nose test normal bilat, rapid alternating movement- intact, Gait- Normal. EXTREMITIES- pulse 2+, symmetric, no pedal edema. SKIN- Warm, dry, No rash or lesion. PSYCH- Normal mood  and affect, appropriate thought content and speech.  Assessment & Plan:  The patient's case and plan of care was discussed with attending physician, Dr. Rogelia Boga.  Viral bronchitis aggravated by prior viral URTI, with persistent Cough of 3 weeks,not increasing in severity. No fevers, equal breath sounds, no crackles, saturating 100%on room air. Prior sorethroat and catarrah now resolved. I still think this is all viral URTI, that is just taking some time to resolve. Considered pertusis, but no Whoop-though seen more in kids, no paroxysms, no lacrimation or conjunctival injection.  Doubt secondary bacteria infection without fevers.  Plan- Guaifenasin-codiene- 100-10 Q8H - CBC- A leukocytosis will suggest bact infection. - If unresolved in 2 weeks, pt might require reimaging, or treatment for bact infection superimposed based onsymptoms.   Please see problem based charting for assessment and plan.

## 2014-02-08 NOTE — Patient Instructions (Signed)
General Instructions:  We will be prescribing a  stronger medication for you to help with the cough. Please take it 3 times a day. Do not take it if you are going to drive.   If you still have this cough in 2 weeks, let us know we might have to do another chest xray again.   We will let you know if we have to treat your cough with more medications.   Thank you for bringing your medicines today. This helps us keep you safe from mistakes.

## 2014-02-10 DIAGNOSIS — R911 Solitary pulmonary nodule: Secondary | ICD-10-CM

## 2014-02-10 HISTORY — DX: Solitary pulmonary nodule: R91.1

## 2014-02-10 NOTE — Assessment & Plan Note (Signed)
Pt presented to ED with complaints suggestive of an URTI, persistent cough and some SOB. CTA was negative for PE, PNA but showed 3 pumonary nodular opacities.  Pt quit smoking- 11 years ago, smoked for 10 years, about 3 packs per week. Pt will require follow up chest CT in 6-12 months.

## 2014-02-11 ENCOUNTER — Encounter: Payer: Self-pay | Admitting: *Deleted

## 2014-02-11 ENCOUNTER — Ambulatory Visit: Payer: Medicare HMO | Admitting: Gastroenterology

## 2014-02-11 NOTE — Progress Notes (Signed)
Patient ID: Sherri Mcmillan, female   DOB: Aug 04, 1932, 80 y.o.   MRN: [id number] Patient should reschedule office appointment if she has ongoing GI issues

## 2014-02-11 NOTE — Progress Notes (Signed)
Patient ID: Sherri ShieldsMattie Gatz, female   DOB: 12-Oct-1932, 80 y.o.   MRN: [id number]    The patient's chart has been reviewed by Dr.Kaplan   and the recommendations are noted below.  Letter mailed to patient Outcome of communication with the patient:

## 2014-02-12 NOTE — Progress Notes (Signed)
Internal Medicine Clinic Attending  Case discussed with Dr. Emokpae soon after the resident saw the patient.  We reviewed the resident's history and exam and pertinent patient test results.  I agree with the assessment, diagnosis, and plan of care documented in the resident's note. 

## 2014-02-14 ENCOUNTER — Telehealth: Payer: Self-pay | Admitting: *Deleted

## 2014-02-14 NOTE — Telephone Encounter (Signed)
Called Pt twice no response.   Sherri Mcmillan.

## 2014-02-14 NOTE — Telephone Encounter (Signed)
Call from pt - states she continues to have a cough, mostly at night , along with a headache. Coughing up mucous - white to light brown in color. Some SOB which she uses her inhaler. [facility]ses [facility] [address] if decide to call in another rx. Thanks

## 2014-02-18 ENCOUNTER — Encounter: Payer: Self-pay | Admitting: Internal Medicine

## 2014-02-18 ENCOUNTER — Ambulatory Visit (INDEPENDENT_AMBULATORY_CARE_PROVIDER_SITE_OTHER): Payer: Medicare HMO | Admitting: Internal Medicine

## 2014-02-18 VITALS — BP 155/82 | HR 52 | Temp 98.1°F | Ht 60.0 in | Wt 123.1 lb

## 2014-02-18 DIAGNOSIS — J208 Acute bronchitis due to other specified organisms: Secondary | ICD-10-CM

## 2014-02-18 DIAGNOSIS — E876 Hypokalemia: Secondary | ICD-10-CM

## 2014-02-18 DIAGNOSIS — Z0000 Encounter for general adult medical examination without abnormal findings: Secondary | ICD-10-CM

## 2014-02-18 DIAGNOSIS — B9689 Other specified bacterial agents as the cause of diseases classified elsewhere: Secondary | ICD-10-CM

## 2014-02-18 DIAGNOSIS — E7800 Pure hypercholesterolemia, unspecified: Secondary | ICD-10-CM

## 2014-02-18 DIAGNOSIS — M810 Age-related osteoporosis without current pathological fracture: Secondary | ICD-10-CM

## 2014-02-18 DIAGNOSIS — I10 Essential (primary) hypertension: Secondary | ICD-10-CM

## 2014-02-18 DIAGNOSIS — I82402 Acute embolism and thrombosis of unspecified deep veins of left lower extremity: Secondary | ICD-10-CM

## 2014-02-18 MED ORDER — AZITHROMYCIN 250 MG PO TABS: ORAL_TABLET | ORAL | Status: DC

## 2014-02-18 MED ORDER — GUAIFENESIN-CODEINE 100-10 MG/5ML PO SYRP: 5.0000 mL | ORAL_SOLUTION | Freq: Three times a day (TID) | ORAL | Status: DC | PRN

## 2014-02-18 NOTE — Telephone Encounter (Signed)
Pt has an appt today w/Dr Virgina OrganQureshi.

## 2014-02-18 NOTE — Patient Instructions (Signed)
For your bronchitis, take azithromycin 500mg  day 1 (today) and 250mg  day 2-5 Continue the cough syrup as needed Return if symptoms do not improve or get worse after 1-2 weeks

## 2014-02-19 DIAGNOSIS — M810 Age-related osteoporosis without current pathological fracture: Secondary | ICD-10-CM | POA: Insufficient documentation

## 2014-02-19 DIAGNOSIS — J42 Unspecified chronic bronchitis: Secondary | ICD-10-CM

## 2014-02-19 DIAGNOSIS — J209 Acute bronchitis, unspecified: Secondary | ICD-10-CM | POA: Insufficient documentation

## 2014-02-19 NOTE — Assessment & Plan Note (Signed)
Recheck next visit 

## 2014-02-19 NOTE — Progress Notes (Signed)
Subjective:   Patient ID: Sherri Mcmillan female   DOB: Jul 13, 1932 80 y.o.   MRN: [id number]  HPI: Sherri Mcmillan is a 80 y.o. female with PMH as listed below presenting to opc today for persistent cough.    Cough--productive (white/yellow phlegm), persistent, intermittent, ongoing for ~1 month, and very bothersome to patient. Associated with hoarseness, chest pain from coughing, and decreased sleep. Last seen by Dr. Mariea ClontsEmokpae 10/16 for similar complaints, thought to have viral bronchitis and prescribed guaifenasin-codeine syrup that did provide some cough relief.  CBC was checked which did not reveal leukocytosis (actually slightly leukopenic). She is afebrile, denies recent sick contacts, and is a former smoker. She also has tessalon tablets from her ED visit but she is not sure if they have been helping. She denies feeling any better and if anything worse saying she is "very tired" and wishes to get rid of this cough.  Last seen in ED 02/02/14 for similar complaints as well and noted to have blood tinged sputum production so had lower extremity duplex study that was negative for DVT and CT angio chest that was negative for PE but did have new nodular opacities on R upper lobe that will need to be followed up in 6-12 months with CT chest. There was also note of slight lower lobe bronchiectatic change bilaterally.  Imaging was reviewed with Dr. Josem KaufmannKlima in clinic today as well.   Past Medical History  Diagnosis Date  . Chronic pain     DJD knees, back, migranes, LLE varicose veins, and LLE neuropathy.   . Aortic stenosis     Dx ECHO 2008 , mild and aymptomatic , needs ECHO q 3-5 yrs  . Barrett's esophagus     Demonstrated on EGD 12/2010. EGD 09/17/2012 shows inflamed GE junctional mucosa without metaplasia, dysplasia, or malignancy.  . Chronic insomnia   . Chronic anxiety     [facility]  for 6 weeks after mother died. Complicated again by death of her sister 2010. 2012  developed hallucinations and I refused to refill controlled meds unless she see psych which she is not agreable to  . Elevated cholesterol 10/11    LDL 155. Her 10 year risk (decreasing her age to [age] as the calculator won't go to age 80) is 21% ish. If consider her non smoker (which I wouldn't even though she says 1 pak lasts 2 weeks) risk is 12% ish. So goal for LDL is 130 or 100.  Marland Kitchen. HTN (hypertension)     Controlled with 2 drug therapy  . Cataract   . Depression   . Gastroparesis     Demonstrated on GES 12/2010 by Dr Dalene SeltzerKapland  . Bronchitis     hx of  . Left leg DVT 09/28/2012  . Anemia 12/14/2012  . GERD (gastroesophageal reflux disease)   . H/O hiatal hernia   . Migraine     sometimes   Current Outpatient Prescriptions  Medication Sig Dispense Refill  . albuterol (PROVENTIL HFA;VENTOLIN HFA) 108 (90 BASE) MCG/ACT inhaler Inhale 1-2 puffs into the lungs every 6 (six) hours as needed for wheezing or shortness of breath.  1 Inhaler  0  . amLODipine (NORVASC) 10 MG tablet Take 1 tablet (10 mg total) by mouth daily with breakfast.  30 tablet  2  . benzonatate (TESSALON) 100 MG capsule Take 1 capsule (100 mg total) by mouth every 8 (eight) hours.  21 capsule  0  . citalopram (CELEXA) 40 MG  tablet Take 40 mg by mouth daily.      Marland Kitchen. docusate sodium ([employer] STOOL SOFTENER) 100 MG capsule Take 100 mg by mouth daily as needed for mild constipation.      Marland Kitchen. esomeprazole (NEXIUM) 20 MG capsule Take 20 mg by mouth daily at 12 noon.      . furosemide (LASIX) 40 MG tablet Take 1.5 tablets (60 mg total) by mouth daily with breakfast.  30 tablet  2  . guaiFENesin-codeine (ROBITUSSIN AC) 100-10 MG/5ML syrup Take 5 mLs by mouth 3 (three) times daily as needed for cough.  240 mL  1  . ibuprofen (ADVIL,MOTRIN) 800 MG tablet Take 1 tablet (800 mg total) by mouth 3 (three) times daily.  21 tablet  0  . lidocaine (XYLOCAINE) 2 % solution Use as directed 20 mLs in the mouth or throat as needed for mouth pain.  100 mL   0  . propranolol (INDERAL) 40 MG tablet Take 1 tablet (40 mg total) by mouth 3 (three) times daily.  90 tablet  1  . azithromycin (ZITHROMAX Z-PAK) 250 MG tablet Take 500mg  day 1, then 250 day 2-5  6 each  0   No current facility-administered medications for this visit.   Family History  Problem Relation Age of Onset  . Stroke Neg Hx   . Cancer Neg Hx   . Colon cancer Neg Hx   . Anesthesia problems Neg Hx   . Hypotension Neg Hx   . Malignant hyperthermia Neg Hx   . Pseudochol deficiency Neg Hx   . Heart disease Mother   . Hypertension Mother    History   Social History  . Marital Status: Single    Spouse Name: N/A    Number of Children: N/A  . Years of Education: N/A   Social History Main Topics  . Smoking status: Former Smoker -- 50 years    Types: Cigarettes    Quit date: 06/01/2010  . Smokeless tobacco: Never Used  . Alcohol Use: No     Comment: quit 24 years ago  . Drug Use: No  . Sexual Activity: None   Other Topics Concern  . 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   Gastrointestinal:  Nausea. Denies vomiting.   Genitourinary:  Denies dysuria  Musculoskeletal:  Lower extremity swelling  Skin:  Denies rash  Neurological:  Denies syncope   Objective:  Physical Exam: Filed Vitals:   02/18/14 1612  BP: 155/82  Pulse: 52  Temp: 98.1 F (36.7 C)  TempSrc: Oral  Height: 5' (1.524 m)  Weight: 123 lb 1.6 oz (55.838 kg)  SpO2: 100%   Vitals reviewed. General: sitting in  chair, NAD, intermittent coughing, hoarse voice HEENT: EOMI, no pharyngeal exudates seen Cardiac: RRR Pulm: clear to auscultation bilaterally, mild rhonchi that cleared after coughing Abd: soft, nontender, BS present Ext: moving lower extremities, TED hose in place, LLE more edematous than right,  palpable cords on posterior surface of left calf Neuro: alert and oriented X3  Assessment & Plan:  Discussed with Dr. Josem Kaufmann zpack for bacterial bronchitis

## 2014-02-19 NOTE — Assessment & Plan Note (Addendum)
Lipid panel check next visit Dr. Andrey Mcmillan ordered a Dexa scan last visit that was done 01/30/2014 and significant for osteoporosis. I would consider starting discussion with Sherri Mcmillan on possible bisphosphonate therapy and if she is willing to start. Caution for her renal function (should be okay with CKD2 but will need to be monitored and potentially causing more pain as a side effect). Additionally, I would recommend calcium-vitamin D supplementation (again, will need to be discussed with patient as she already did not like having so many medications with her today).  -not sure if Dr. Andrey Mcmillan had already planned on addressing this on her scheduled visit in December as I did not see a specific result note, however, I will send her a message as well and if Sherri Mcmillan does follow up prior to that scheduled visit, the discussion can be had then.  This topic was not specifically addressed during our acute visit today but was noticed on chart review at this time.

## 2014-02-19 NOTE — Assessment & Plan Note (Signed)
Recheck bmet on next visit. Likely in setting of diuretic and GI loss noted on 10/10 visit. Vomiting has since then resolved.   -please also see my recommendations under HTN problem based charting

## 2014-02-19 NOTE — Assessment & Plan Note (Signed)
Results noted on chart review after patient had left and not addressed on acute visit today. However, would recommend discussion with patient about bisphosphonate therapy and calcium and vitamin d supplementation on next visit. Caution for renal function with bisphosphonate.

## 2014-02-19 NOTE — Assessment & Plan Note (Addendum)
Continues to have chronic lower extremity edema s/p DVT in that extremity in 2014. Completed coumadin course. LLE duplex 10/10 negative for DVT. CTA that same day negative for PE. Unilateral swelling also noted September of last year on Dr. Neita GarnetHoffman's note and she says the amount of swelling in the leg is variable, since developed cough has been feeling like it is slightly more swollen than other days.   -wearing TED hose that helps with swelling -on lasix that she says also helps with swelling -elevate lower extremities when resting -continue to monitor, if drastic change in size or develops pain and erythema in extremities would be suspicious of possible recurrence of DVT.

## 2014-02-19 NOTE — Progress Notes (Signed)
Case discussed with Dr. Qureshi at time of visit.  We reviewed the resident's history and exam and pertinent patient test results.  I agree with the assessment, diagnosis, and plan of care documented in the resident's note. 

## 2014-02-19 NOTE — Assessment & Plan Note (Addendum)
Productive cough x1 month, worsening mainly at night. Did get some relief with guiafenesin-codeine syrup but has run out.   Given that her cough has persisted, worsening at night, and is productive, it is possible there is a bacterial component and thus, I favor a trial of antibiotics at this time. She could have also had viral bronchitis and now superimposed with bacterial infection.  -zpack -refilled guaifenesin-codeine syrup -advised to return in 1 week if no improvement or if symptoms worsen  If her cough persists, to rule out GERD as contributing factor, she will need at least 3 months of strict adherence to PPI. Additionally, she has a hx of smoking and has nodules on CT scan that will need to be follow up in 6-12 months.

## 2014-02-19 NOTE — Assessment & Plan Note (Addendum)
BP Readings from Last 3 Encounters:  02/18/14 155/82  02/08/14 143/92  02/02/14 151/97   Lab Results  Component Value Date   NA 142 02/02/2014   K 3.3* 02/02/2014   CREATININE 0.83 02/02/2014   Assessment: Blood pressure control: moderately elevated Progress toward BP goal:  deteriorated Comments: has been elevated past 3 visits in the setting of this cough which could be contributing. Unable to get repeat bp prior to her leaving the office.   Plan: Medications:  continue current medications norvasc 10, lasix 60mg  qd, and propranalol 40mg  TID Other plans: recheck on next visit. Goal BP in patient with CKD2 per [facility] 8 should be <140/90.   Looking back, she has been on HCTZ in the past but was noted to fall off the list after 2012. Since she is max on norvasc, I would consider starting ACEi in setting of CKD. Could also consider restarting HCTZ (which may reduce the lasix burden) if she does not wish to be on ACEi or prefers another diuretic. I would also consider adjusting propranalol (migrain prophylaxis) dose as she was noted to have bradycardia on initial vitals but did not appear symptomatic and HR seemed faster during ascultation but she was coughing which may be attributing.   Finally, I would ask she return for BP visit and have BMET done as well (noted hypokalemia on ED visit 10/10 to 3.3 but not rechecked since and lab was closed by the time her visit was done today. This may have been in the setting of decreased po intake and GI loss with coughing and vomiting and hopefully has self corrected since she is no longer vomiting, but she on lasix and should be monitored and supplemented if needed. ACEi may also help with potassium if she remains hypokalemic.

## 2014-03-01 ENCOUNTER — Encounter: Payer: Self-pay | Admitting: Internal Medicine

## 2014-03-01 ENCOUNTER — Other Ambulatory Visit: Payer: Self-pay | Admitting: Internal Medicine

## 2014-03-01 ENCOUNTER — Ambulatory Visit (INDEPENDENT_AMBULATORY_CARE_PROVIDER_SITE_OTHER): Payer: Medicare HMO | Admitting: Internal Medicine

## 2014-03-01 VITALS — BP 146/75 | HR 62 | Temp 98.3°F | Ht 60.0 in | Wt 122.2 lb

## 2014-03-01 DIAGNOSIS — Z0000 Encounter for general adult medical examination without abnormal findings: Secondary | ICD-10-CM

## 2014-03-01 DIAGNOSIS — R059 Cough, unspecified: Secondary | ICD-10-CM | POA: Insufficient documentation

## 2014-03-01 DIAGNOSIS — R05 Cough: Secondary | ICD-10-CM | POA: Insufficient documentation

## 2014-03-01 DIAGNOSIS — M79662 Pain in left lower leg: Secondary | ICD-10-CM

## 2014-03-01 DIAGNOSIS — F419 Anxiety disorder, unspecified: Secondary | ICD-10-CM

## 2014-03-01 DIAGNOSIS — R51 Headache: Secondary | ICD-10-CM

## 2014-03-01 DIAGNOSIS — E785 Hyperlipidemia, unspecified: Secondary | ICD-10-CM

## 2014-03-01 DIAGNOSIS — M810 Age-related osteoporosis without current pathological fracture: Secondary | ICD-10-CM

## 2014-03-01 DIAGNOSIS — R519 Headache, unspecified: Secondary | ICD-10-CM

## 2014-03-01 MED ORDER — ALENDRONATE SODIUM 70 MG PO TABS: 70.0000 mg | ORAL_TABLET | ORAL | Status: DC

## 2014-03-01 MED ORDER — GUAIFENESIN-CODEINE 100-10 MG/5ML PO SYRP: 5.0000 mL | ORAL_SOLUTION | Freq: Three times a day (TID) | ORAL | Status: DC | PRN

## 2014-03-01 MED ORDER — CALCIUM CARB-CHOLECALCIFEROL 600-800 MG-UNIT PO TABS: 600.0000 mg | ORAL_TABLET | Freq: Two times a day (BID) | ORAL | Status: DC

## 2014-03-01 MED ORDER — CITALOPRAM HYDROBROMIDE 40 MG PO TABS: 40.0000 mg | ORAL_TABLET | Freq: Every day | ORAL | Status: DC

## 2014-03-01 NOTE — Progress Notes (Signed)
INTERNAL MEDICINE TEACHING ATTENDING ADDENDUM -  , MD: I reviewed and discussed at the time of visit with the resident Dr. McLean, the patient's medical history, physical examination, diagnosis and results of pertinent tests and treatment and I agree with the patient's care as documented.  

## 2014-03-01 NOTE — Assessment & Plan Note (Signed)
Will check lipids today

## 2014-03-01 NOTE — Assessment & Plan Note (Addendum)
Etiology could be due to viral illness where cough can persists for 2 months, vs GERD (though pt taking ppi), with smoking history may want to r/o COPD if pt does not have pfts  Hopefully will resolved soon Will give Robitussin AC #240 x 1 more time, continue Albuterol inhaler, continue ppi RTC 04/10/14 to f/u with PCP

## 2014-03-01 NOTE — Assessment & Plan Note (Signed)
Started on Fosamax 70 mg qweek, calcium and vitamin D

## 2014-03-01 NOTE — Assessment & Plan Note (Signed)
Rx refill of Celexa

## 2014-03-01 NOTE — Patient Instructions (Signed)
General Instructions: Please follow up in 1 month if not feeling better with cough We will try another short course of cough medicine Also we will refer you to h/a clinic in the meantime continue Inderal and take as needed Tylenol or Advil for headaches  Get an ultrasound of your left leg  We will check cholesterol today  We will start you on a medicine for osteoporosis.    Treatment Goals:  Goals (1 Years of Data) as of 03/01/14          As of Today 02/18/14 02/08/14 02/02/14 02/02/14     Blood Pressure   . Blood Pressure < 140/90  146/75 155/82 143/92 151/97 129/76     Result Component   . LDL CALC < 130            Progress Toward Treatment Goals:  Treatment Goal 03/01/2014  Blood pressure at goal    Self Care Goals & Plans:  Self Care Goal 03/01/2014  Manage my medications take my medicines as prescribed; bring my medications to every visit; refill my medications on time; follow the sick day instructions if I am sick  Monitor my health keep track of my blood pressure  Eat healthy foods drink diet soda or water instead of juice or soda; eat more vegetables; eat foods that are low in salt; eat baked foods instead of fried foods; eat fruit for snacks and desserts; eat smaller portions  Be physically active find an activity I enjoy  Meeting treatment goals -    No flowsheet data found.   Care Management & Community Referrals:  Referral 03/01/2014  Referrals made for care management support none needed  Referrals made to community resources none    Cough, Adult  A cough is a reflex that helps clear your throat and airways. It can help heal the body or may be a reaction to an irritated airway. A cough may only last 2 or 3 weeks (acute) or may last more than 8 weeks (chronic).  CAUSES Acute cough:  Viral or bacterial infections. Chronic cough:  Infections.  Allergies.  Asthma.  Post-nasal drip.  Smoking.  Heartburn or acid reflux.  Some medicines.  Chronic  lung problems (COPD).  Cancer. SYMPTOMS   Cough.  Fever.  Chest pain.  Increased breathing rate.  High-pitched whistling sound when breathing (wheezing).  Colored mucus that you cough up (sputum). TREATMENT   A bacterial cough may be treated with antibiotic medicine.  A viral cough must run its course and will not respond to antibiotics.  Your caregiver may recommend other treatments if you have a chronic cough. HOME CARE INSTRUCTIONS   Only take over-the-counter or prescription medicines for pain, discomfort, or fever as directed by your caregiver. Use cough suppressants only as directed by your caregiver.  Use a cold steam vaporizer or humidifier in your bedroom or home to help loosen secretions.  Sleep in a semi-upright position if your cough is worse at night.  Rest as needed.  Stop smoking if you smoke. SEEK IMMEDIATE MEDICAL CARE IF:   You have pus in your sputum.  Your cough starts to worsen.  You cannot control your cough with suppressants and are losing sleep.  You begin coughing up blood.  You have difficulty breathing.  You develop pain which is getting worse or is uncontrolled with medicine.  You have a fever. MAKE SURE YOU:   Understand these instructions.  Will watch your condition.  Will get help right away if  you are not doing well or get worse. Document Released: 10/09/2010 Document Revised: 07/05/2011 Document Reviewed: 10/09/2010 [facility] Patient Information 2015 [employer] [place] [place]. This information is not intended to replace advice given to you by your health care provider. Make sure you discuss any questions you have with your health care provider.   Alendronate tablets What is this medicine? ALENDRONATE (a LEN droe nate) slows calcium loss from bones. It helps to make normal healthy bone and to slow bone loss in people with Paget's disease and osteoporosis. It may be used in others at risk for bone loss. This medicine may be used for  other purposes; ask your health care provider or pharmacist if you have questions. COMMON BRAND NAME(S): Fosamax What should I tell my health care provider before I take this medicine? They need to know if you have any of these conditions: -dental disease -esophagus, stomach, or intestine problems, like acid reflux or GERD -kidney disease -low blood calcium -low vitamin D -problems sitting or standing 30 minutes -trouble swallowing -an unusual or allergic reaction to alendronate, other medicines, foods, dyes, or preservatives -pregnant or trying to get pregnant -breast-feeding How should I use this medicine? You must take this medicine exactly as directed or you will lower the amount of the medicine you absorb into your body or you may cause yourself harm. Take this medicine by mouth first thing in the morning, after you are up for the day. Do not eat or drink anything before you take your medicine. Swallow the tablet with a full glass (6 to 8 fluid ounces) of plain water. Do not take this medicine with any other drink. Do not chew or crush the tablet. After taking this medicine, do not eat breakfast, drink, or take any medicines or vitamins for at least 30 minutes. Sit or stand up for at least 30 minutes after you take this medicine; do not lie down. Do not take your medicine more often than directed. Talk to your pediatrician regarding the use of this medicine in children. Special care may be needed. Overdosage: If you think you have taken too much of this medicine contact a poison control center or emergency room at once. NOTE: This medicine is only for you. Do not share this medicine with others. What if I miss a dose? If you miss a dose, do not take it later in the day. Continue your normal schedule starting the next morning. Do not take double or extra doses. What may interact with this medicine? -aluminum hydroxide -antacids -aspirin -calcium supplements -drugs for inflammation like  ibuprofen, naproxen, and others -iron supplements -magnesium supplements -vitamins with minerals This list may not describe all possible interactions. Give your health care provider a list of all the medicines, herbs, non-prescription drugs, or dietary supplements you use. Also tell them if you smoke, drink alcohol, or use illegal drugs. Some items may interact with your medicine. What should I watch for while using this medicine? Visit your doctor or health care professional for regular checks ups. It may be some time before you see benefit from this medicine. Do not stop taking your medicine except on your doctor's advice. Your doctor or health care professional may order blood tests and other tests to see how you are doing. You should make sure you get enough calcium and vitamin D while you are taking this medicine, unless your doctor tells you not to. Discuss the foods you eat and the vitamins you take with your health care professional. Some people  who take this medicine have severe bone, joint, and/or muscle pain. This medicine may also increase your risk for a broken thigh bone. Tell your doctor right away if you have pain in your upper leg or groin. Tell your doctor if you have any pain that does not go away or that gets worse. This medicine can make you more sensitive to the sun. If you get a rash while taking this medicine, sunlight may cause the rash to get worse. Keep out of the sun. If you cannot avoid being in the sun, wear protective clothing and use sunscreen. Do not use sun lamps or tanning beds/booths. What side effects may I notice from receiving this medicine? Side effects that you should report to your doctor or health care professional as soon as possible: -allergic reactions like skin rash, itching or hives, swelling of the face, lips, or tongue -black or tarry stools -bone, muscle or joint pain -changes in vision -chest pain -heartburn or stomach pain -jaw pain, especially  after dental work -pain or trouble when swallowing -redness, blistering, peeling or loosening of the skin, including inside the mouth Side effects that usually do not require medical attention (report to your doctor or health care professional if they continue or are bothersome): -changes in taste -diarrhea or constipation -eye pain or itching -headache -nausea or vomiting -stomach gas or fullness This list may not describe all possible side effects. Call your doctor for medical advice about side effects. You may report side effects to FDA at 1-800-FDA-1088. Where should I keep my medicine? Keep out of the reach of children. Store at room temperature of 15 and 30 degrees C (59 and 86 degrees F). Throw away any unused medicine after the expiration date. NOTE: This sheet is a summary. It may not cover all possible information. If you have questions about this medicine, talk to your doctor, pharmacist, or health care provider.  2015, [employer]. (2010-10-09 08:56:09)

## 2014-03-01 NOTE — Assessment & Plan Note (Signed)
Will need repeat CT scan 6-12 months from 01/2014

## 2014-03-01 NOTE — Addendum Note (Signed)
Addended by: Annett GulaMCLEAN,  N on: 03/01/2014 01:02 PM   Modules accepted: Level of Service

## 2014-03-01 NOTE — Assessment & Plan Note (Signed)
Could be migraines given duration  Inderal 40 tid is not helping  Will do CT head w/o contrast to further w/u  Will refer to [facility] and wellness clinic to further tx  Dis'ced with pt will not tx with narcotics and can try NSAIDS vs Tylenol prn until follows with h/a specialist

## 2014-03-01 NOTE — Progress Notes (Addendum)
   Subjective:    Patient ID: Sherri Mcmillan, female    DOB: July 04, 1932, 80 y.o.   MRN: [id number]  HPI Comments: 80 y.o presents for f/u since 02/18/14 for   1. Cough (now with clear sputum) noted for >1 month worse at night causing her throat to be sore and decreased sleep.  She just completed Zpack for tx of suspected acute bronchitis.  She try Robitussin AC with relief and requests Rx refill. 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 She denies allergies other than medication.  CT was recently done 01/2014 and showed pulm nodules rec repeat CT in 6-12 months disc'ed with patient.  She denies smoking but states she quit >20+ years ago.   2. H/o migraines-Freq is 0-2 times per week and she has had them for > 10 years. When she gets them she is bothered by sound, light. Distrubution is bilateral temples/ears/frontal.  Her last h/a was last night and this am h/a is better though rates a 8/10.  She denies vision changes though states her eyes are bad after cataracts were removed 3 years ago which messed her eyes up.  She denies sinus problems, n/v, weakness. She takes her PPI daily.  She takes Inderal tid.  She reports having seen Neurology in the past for h/a and states they gave her hydrocodone which helped h/a and she requests Rx refill of tha.   3. She reports left calf pain with h/o DVT last US 09/2012 +DVT.  Disc'ed getting US LLE but pt declines today and will come back Tuesday.   4. Recent DEXA + osteoporsis-disc'ed tx and pt agreeable.   ROS see above  HM: due to have lipid checked     Review of Systems  Constitutional: Negative for fever and chills.  Eyes: Negative for visual disturbance.  Respiratory: Positive for cough.   Gastrointestinal: Negative for nausea and vomiting.  Neurological: Positive for headaches. Negative for weakness.       Objective:   Physical Exam  Constitutional: She is oriented to person, place, and time. Vital signs are normal.  She appears well-developed and well-nourished. She is cooperative. No distress.  HENT:  Head: Normocephalic and atraumatic.  Mouth/Throat: Oropharynx is clear and moist and mucous membranes are normal. She has dentures. No oropharyngeal exudate.  Eyes: Conjunctivae are normal. Pupils are equal, round, and reactive to light. Right eye exhibits no discharge. Left eye exhibits no discharge. No scleral icterus.  Cardiovascular: Normal rate, regular rhythm, S1 normal, S2 normal and normal heart sounds.   No murmur heard. Pulmonary/Chest: Effort normal and breath sounds normal. No respiratory distress. She has no wheezes.  Musculoskeletal:       Legs: Neurological: She is alert and oriented to person, place, and time. She has normal strength. No cranial nerve deficit or sensory deficit. Gait normal.  CN 2-12 grossly intact, moving all 4 extremities   Skin: Skin is warm, dry and intact. No rash noted. She is not diaphoretic.  Psychiatric: She has a normal mood and affect. Her speech is normal and behavior is normal. Judgment and thought content normal. Cognition and memory are normal.  Nursing note and vitals reviewed.         Assessment & Plan:  F/u 04/10/14

## 2014-03-01 NOTE — Assessment & Plan Note (Signed)
Lipid panel today

## 2014-03-01 NOTE — Assessment & Plan Note (Signed)
Pt still c/o left leg pain will want to r/o DVT with US pt declines to get done today and will come back Tuesday. Disc'ed risks of waiting.   If + will need Dr. Alexandria LodgeGroce involved since had issues with Couamdin in the past (i.e warfarin induced coagulopathy)

## 2014-03-02 LAB — LIPID PANEL
Cholesterol: 205 mg/dL — ABNORMAL HIGH (ref 0–200)
HDL: 82 mg/dL (ref >39)
LDL Cholesterol: 113 mg/dL — ABNORMAL HIGH (ref 0–99)
Total CHOL/HDL Ratio: 2.5 Ratio
Triglycerides: 51 mg/dL (ref <150)
VLDL: 10 mg/dL (ref 0–40)

## 2014-03-04 ENCOUNTER — Encounter: Payer: Self-pay | Admitting: Internal Medicine

## 2014-03-05 ENCOUNTER — Ambulatory Visit (HOSPITAL_COMMUNITY)
Admission: RE | Admit: 2014-03-05 | Discharge: 2014-03-05 | Disposition: A | Discharge status: Home / Self Care | Payer: Medicare HMO | Source: Ambulatory Visit | Attending: Internal Medicine | Admitting: Internal Medicine

## 2014-03-05 DIAGNOSIS — M79662 Pain in left lower leg: Secondary | ICD-10-CM

## 2014-03-05 DIAGNOSIS — D6832 Hemorrhagic disorder due to extrinsic circulating anticoagulants: Secondary | ICD-10-CM

## 2014-03-05 DIAGNOSIS — T45515A Adverse effect of anticoagulants, initial encounter: Secondary | ICD-10-CM

## 2014-03-05 NOTE — Progress Notes (Signed)
Left lower extremity venous duplex completed.  Left:  No evidence of DVT, superficial thrombosis, or Baker's cyst.  Right:  Negative for DVT in the common femoral vein.  

## 2014-03-06 ENCOUNTER — Encounter: Payer: Self-pay | Admitting: Internal Medicine

## 2014-03-07 ENCOUNTER — Ambulatory Visit (INDEPENDENT_AMBULATORY_CARE_PROVIDER_SITE_OTHER): Payer: Medicare HMO | Admitting: Internal Medicine

## 2014-03-07 ENCOUNTER — Encounter: Payer: Self-pay | Admitting: Internal Medicine

## 2014-03-07 VITALS — BP 151/83 | HR 75 | Temp 97.8°F | Ht 60.0 in | Wt 122.9 lb

## 2014-03-07 DIAGNOSIS — I872 Venous insufficiency (chronic) (peripheral): Secondary | ICD-10-CM

## 2014-03-07 MED ORDER — CAPSAICIN 0.1 % EX CREA: 1.0000 | TOPICAL_CREAM | Freq: Three times a day (TID) | CUTANEOUS | Status: DC

## 2014-03-07 NOTE — Assessment & Plan Note (Signed)
Changes of stasis dermatitis to LLE, no evidence of DVT per recent US Pt still concerned with pain.  Can continue Aleve, compression stockings, elevation  Pain may also be associated with varicosities  Will refer to vascular surgery for more reassurance or further w/u.   Pt can try Capsaicin cream to leg as well to see if helps relieve the patin

## 2014-03-07 NOTE — Patient Instructions (Addendum)
General Instructions: Try Capsaicin cream rubbing on your left leg  Try alternating Tylenol with Advil We will refer you to vascular surgery to look at your leg  Wear your compression stockings Take care    Treatment Goals:  Goals (1 Years of Data) as of 03/07/14          As of Today 03/01/14 02/18/14 02/08/14 02/02/14     Blood Pressure   . Blood Pressure < 140/90  151/83 146/75 155/82 143/92 151/97     Result Component   . LDL CALC < 130   113         Progress Toward Treatment Goals:  Treatment Goal 03/07/2014  Blood pressure at goal    Self Care Goals & Plans:  Self Care Goal 03/07/2014  Manage my medications take my medicines as prescribed; bring my medications to every visit; refill my medications on time; follow the sick day instructions if I am sick  Monitor my health keep track of my blood pressure; check my feet daily; keep track of my weight  Eat healthy foods drink diet soda or water instead of juice or soda; eat more vegetables; eat foods that are low in salt; eat baked foods instead of fried foods; eat smaller portions; eat fruit for snacks and desserts  Be physically active find an activity I enjoy  Meeting treatment goals maintain the current self-care plan    No flowsheet data found.   Care Management & Community Referrals:  Referral 03/07/2014  Referrals made for care management support none needed  Referrals made to community resources none       Varicose Veins Varicose veins are veins that have become enlarged and twisted. CAUSES This condition is the result of valves in the veins not working properly. Valves in the veins help return blood from the leg to the heart. If these valves are damaged, blood flows backwards and backs up into the veins in the leg near the skin. This causes the veins to become larger. People who are on their feet a lot, who are pregnant, or who are overweight are more likely to develop varicose veins. SYMPTOMS    Bulging, twisted-appearing, bluish veins, most commonly found on the legs.  Leg pain or a feeling of heaviness. These symptoms may be worse at the end of the day.  Leg swelling.  Skin color changes. DIAGNOSIS  Varicose veins can usually be diagnosed with an exam of your legs by your caregiver. He or she may recommend an ultrasound of your leg veins. TREATMENT  Most varicose veins can be treated at home.However, other treatments are available for people who have persistent symptoms or who want to treat the cosmetic appearance of the varicose veins. These include:  Laser treatment of very small varicose veins.  Medicine that is shot (injected) into the vein. This medicine hardens the walls of the vein and closes off the vein. This treatment is called sclerotherapy. Afterwards, you may need to wear clothing or bandages that apply pressure.  Surgery. HOME CARE INSTRUCTIONS   Do not stand or sit in one position for long periods of time. Do not sit with your legs crossed. Rest with your legs raised during the day.  Wear elastic stockings or support hose. Do not wear other tight, encircling garments around the legs, pelvis, or waist.  Walk as much as possible to increase blood flow.  Raise the foot of your bed at night with 2-inch blocks.  If you get a cut in  the skin over the vein and the vein bleeds, lie down with your leg raised and press on it with a clean cloth until the bleeding stops. Then place a bandage (dressing) on the cut. See your caregiver if it continues to bleed or needs stitches. SEEK MEDICAL CARE IF:   The skin around your ankle starts to break down.  You have pain, redness, tenderness, or hard swelling developing in your leg over a vein.  You are uncomfortable due to leg pain. Document Released: 01/20/2005 Document Revised: 07/05/2011 Document Reviewed: 06/08/2010 [facility] Patient Information 2015 [place], [place]. This information is not intended to replace advice  given to you by your health care provider. Make sure you discuss any questions you have with your health care provider.

## 2014-03-07 NOTE — Progress Notes (Signed)
   Subjective:    Patient ID: Sherri Mcmillan, female    DOB: [birth year], 80 y.o.   MRN: [id number]  HPI Comments: 80 y.o presents for f/u < 1 week  1. Reviewed last weeks test and US results  2. Cough doing better but pt is concerned about left leg pain which is sharp, aggravating, and c/o leg swelling.  She is concerned for a blood clot as she previously had a blood clot in that leg and her friends husband just diet with blood clots.  Reassurred that reviewed US 03/05/14 w/o evidence of blood clots though she has a h/o blood clots in the past.  Pt is wearing compression stockings and taking Aleve w/o any relief.          Review of Systems  Respiratory: Negative for cough.   Cardiovascular: Positive for leg swelling.       Objective:   Physical Exam  Constitutional: She is oriented to person, place, and time. Vital signs are normal. She appears well-developed and well-nourished. She is cooperative. No distress.  HENT:  Head: Normocephalic and atraumatic.  Mouth/Throat: No oropharyngeal exudate.  Eyes: Conjunctivae are normal. Right eye exhibits no discharge. Left eye exhibits no discharge. No scleral icterus.  Cardiovascular: Normal rate, regular rhythm, S1 normal, S2 normal and normal heart sounds.   No murmur heard. Pulses:      Dorsalis pedis pulses are 1+ on the left side.       Posterior tibial pulses are 1+ on the left side.  No sig lower ext edema LLE  Pulmonary/Chest: Effort normal and breath sounds normal. No respiratory distress. She has no wheezes.  Neurological: She is alert and oriented to person, place, and time. Gait normal.  Skin: Skin is warm and dry. No rash noted. She is not diaphoretic.  Hyperpigmented changes to LLE could be result of stasis dermatitis also varicosities esp is LLE posterior   Psychiatric: She has a normal mood and affect. Her speech is normal and behavior is normal. Judgment and thought content normal. Cognition and memory are normal.    Nursing note and vitals reviewed.         Assessment & Plan:  F/u in 2-3 months, will refer to vascular surgery

## 2014-03-08 NOTE — Progress Notes (Signed)
Internal Medicine Clinic Attending  Case discussed with Dr. McLean soon after the resident saw the patient.  We reviewed the resident's history and exam and pertinent patient test results.  I agree with the assessment, diagnosis, and plan of care documented in the resident's note. 

## 2014-03-13 ENCOUNTER — Other Ambulatory Visit: Payer: Self-pay | Admitting: *Deleted

## 2014-03-13 DIAGNOSIS — I83892 Varicose veins of left lower extremities with other complications: Secondary | ICD-10-CM

## 2014-03-21 ENCOUNTER — Other Ambulatory Visit: Payer: Self-pay | Admitting: Internal Medicine

## 2014-04-04 ENCOUNTER — Encounter: Payer: Self-pay | Admitting: Vascular Surgery

## 2014-04-05 ENCOUNTER — Ambulatory Visit (INDEPENDENT_AMBULATORY_CARE_PROVIDER_SITE_OTHER): Payer: Medicare HMO | Admitting: Vascular Surgery

## 2014-04-05 ENCOUNTER — Encounter: Payer: Self-pay | Admitting: Vascular Surgery

## 2014-04-05 ENCOUNTER — Ambulatory Visit (HOSPITAL_COMMUNITY)
Admission: RE | Admit: 2014-04-05 | Discharge: 2014-04-05 | Disposition: A | Discharge status: Home / Self Care | Payer: Medicare HMO | Source: Ambulatory Visit | Attending: Vascular Surgery | Admitting: Vascular Surgery

## 2014-04-05 ENCOUNTER — Other Ambulatory Visit: Payer: Self-pay | Admitting: Vascular Surgery

## 2014-04-05 VITALS — BP 118/73 | HR 54 | Ht 60.0 in | Wt 118.2 lb

## 2014-04-05 DIAGNOSIS — I83892 Varicose veins of left lower extremities with other complications: Secondary | ICD-10-CM

## 2014-04-05 DIAGNOSIS — I8390 Asymptomatic varicose veins of unspecified lower extremity: Secondary | ICD-10-CM | POA: Diagnosis not present

## 2014-04-05 DIAGNOSIS — I872 Venous insufficiency (chronic) (peripheral): Secondary | ICD-10-CM

## 2014-04-05 MED ORDER — ACETAMINOPHEN-CODEINE #3 300-30 MG PO TABS: 1.0000 | ORAL_TABLET | ORAL | Status: DC | PRN

## 2014-04-05 NOTE — Progress Notes (Signed)
Referred by:  Yolanda MangesAlex M Wilson, DO [address] [place], [place] [id number]  Reason for referral: Swollen and painful left calf  History of Present Illness  Sherri ShieldsMattie Mcmillan is a 80 y.o. (09-20-1932) female prior history of calf DVT (2014) who presents with chief complaint: swollen and painful left calf.  Patient notes, onset of swelling unknown number of months months ago, associated with no obvious trigger.  This patient previously was found to have swelling in left leg with L soleal DVT.  She does not recall starting anticoagulation for such.  Subsequent venous duplex have demonstrated resolution of the DVT.  The patient's symptoms include: pain and swelling in the posterior calf, worse at night preventing her from sleep.  The patient has had known history of DVT, no history of pregnancy, known history of varicose vein, no history of venous stasis ulcers, no history of  Lymphedema and known history of skin changes in lower legs.  There is no family history of venous disorders.  The patient has used therapeutic thigh high compression stockings for the last 3-4 months at the instructions of her PCP.   Past Medical History  Diagnosis Date  . Chronic pain     DJD knees, back, migranes, LLE varicose veins, and LLE neuropathy.   . Aortic stenosis     Dx ECHO 2008 , mild and aymptomatic , needs ECHO q 3-5 yrs  . Barrett's esophagus     Demonstrated on EGD 12/2010. EGD 09/17/2012 shows inflamed GE junctional mucosa without metaplasia, dysplasia, or malignancy.  . Chronic insomnia   . Chronic anxiety     [facility]  [facility] after mother died. Complicated again by death of her sister 2010. 2012 developed hallucinations and I refused to refill controlled meds unless she see psych which she is not agreable to  . Elevated cholesterol 10/11    LDL 155. Her 10 year risk (decreasing her age to [age] as the calculator won't go to age 89) is 21% ish. If consider her non smoker (which I  wouldn't even though she says 1 pak lasts 2 weeks) risk is 12% ish. So goal for LDL is 130 or 100.  Marland Kitchen. HTN (hypertension)     Controlled with 2 drug therapy  . Cataract   . Depression   . Gastroparesis     Demonstrated on GES 12/2010 by Dr Dalene SeltzerKapland  . Bronchitis     hx of  . Left leg DVT 09/28/2012  . Anemia 12/14/2012  . GERD (gastroesophageal reflux disease)   . H/O hiatal hernia   . Migraine     sometimes  . DVT (deep venous thrombosis)     Past Surgical History  Procedure Laterality Date  . Direct laryngoscopy   September 2008     preoperative diagnosis hoarseness with anterior right vocal cord lesion -  direct laryngoscopy and excisional biopsy of right anterior vocal cord lesion done by Dr. Ezzard StandingNewman  . Meniscectomy   July 2002     preoperative diagnosis torn medial meniscus right knee, partial medial meniscectomy, debridement chondroplasty patellofemoral joint, done by Dr. Madelon Lipsaffrey  . Abdominal hysterectomy    . Hemorrhoid surgery    . Appendectomy    . Cataract extraction      left eye  . Polypectomy  2000    Dr.Magod  . Colonoscopy  2000&2005    Dr.Magod  . Esophagogastroduodenoscopy  11/2010  . Knee arthroscopy    . Eye  surgery    . Rotator cuff repair  09/21/2011    rt shoulder  . Esophagogastroduodenoscopy N/A 09/17/2012    Procedure: ESOPHAGOGASTRODUODENOSCOPY (EGD);  Surgeon: Hart Carwinora M Brodie, MD;  [facility];  Service: Endoscopy;  Laterality: N/A;  . Esophagogastroduodenoscopy N/A 12/17/2012    Procedure: ESOPHAGOGASTRODUODENOSCOPY (EGD);  Surgeon: Beverley FiedlerJay M Pyrtle, MD;  [facility];  Service: Gastroenterology;  Laterality: N/A;  . Laparoscopic nissen fundoplication N/A 05/02/2013    Procedure: LAPAROSCOPIC NISSEN FUNDOPLICATION;  Surgeon: Valarie MerinoMatthew B Martin, MD;  Location: [facility] ORS;  Service: General;  Laterality: N/A;    History   Social History  . Marital Status: Single    Spouse Name: N/A    Number of Children: N/A  . Years of Education: N/A    Occupational History  . Not on file.   Social History Main Topics  . Smoking status: Former Smoker -- 50 years    Types: Cigarettes    Quit date: 06/01/2010  . Smokeless tobacco: Never Used  . Alcohol Use: No     Comment: quit 24 years ago  . Drug Use: No  . Sexual Activity: Not on file   Other Topics Concern  . 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 Stroke Neg Hx   . Cancer Neg Hx   . Colon cancer Neg Hx   . Anesthesia problems Neg Hx   . Hypotension Neg Hx   . Malignant hyperthermia Neg Hx   . Pseudochol deficiency Neg Hx   . Heart disease Mother   . Hypertension Mother   . Heart attack Mother   . Hypertension Father   . Heart attack Father   . Diabetes Brother      Current Outpatient Prescriptions on File Prior to Visit  Medication Sig Dispense Refill  . albuterol (PROVENTIL HFA;VENTOLIN HFA) 108 (90 BASE) MCG/ACT inhaler Inhale 1-2 puffs into the lungs every 6 (six) hours as needed for wheezing or shortness of breath. 1 Inhaler 0  . alendronate (FOSAMAX) 70 MG tablet Take 1 tablet (70 mg total) by mouth once a week. Take with a full glass of water on an empty stomach. 4 tablet 3  . amLODipine (NORVASC) 10 MG tablet TAKE 1 TABLET (10 MG TOTAL) BY MOUTH DAILY WITH BREAKFAST. 30 tablet 2  . azithromycin (ZITHROMAX Z-PAK) 250 MG tablet Take 500mg  day 1, then 250 day 2-5 6 each 0  . Calcium Carb-Cholecalciferol 600-800 MG-UNIT TABS Take 600-800 mg by mouth 2 (two) times daily. 60 tablet 2  . Capsaicin 0.1 % CREA Apply 1 applicator topically 3 (three) times daily. 42.5 g 0  . citalopram (CELEXA)  40 MG tablet Take 1 tablet (40 mg total) by mouth daily. 90 tablet 1  . docusate sodium ([employer] STOOL SOFTENER) 100 MG capsule Take 100 mg by mouth daily as needed for mild constipation.    Marland Kitchen. esomeprazole (NEXIUM) 20 MG capsule Take 20 mg by mouth daily at 12 noon.    . furosemide (LASIX) 40 MG tablet TAKE 1.5 TABLETS (  60 MG TOTAL) BY MOUTH DAILY WITH BREAKFAST. 30 tablet 2  . guaiFENesin-codeine (ROBITUSSIN AC) 100-10 MG/5ML syrup Take 5 mLs by mouth 3 (three) times daily as needed for cough. 240 mL 1  . ibuprofen (ADVIL,MOTRIN) 800 MG tablet Take 1 tablet (800 mg total) by mouth 3 (three) times daily. 21 tablet 0  . lidocaine (XYLOCAINE) 2 % solution Use as directed 20 mLs in the mouth or throat as needed for mouth pain. 100 mL 0  . propranolol (INDERAL) 40 MG tablet TAKE 1 TABLET (40 MG TOTAL) BY MOUTH 3 (THREE) TIMES DAILY. 90 tablet 1  . benzonatate (TESSALON) 100 MG capsule Take 1 capsule (100 mg total) by mouth every 8 (eight) hours. (Patient not taking: Reported on 04/05/2014) 21 capsule 0   No current facility-administered medications on file prior to visit.    Allergies  Allergen Reactions  . Aspirin Rash     REVIEW OF SYSTEMS:  (Positives checked otherwise negative)  CARDIOVASCULAR:  []  chest pain, []  chest pressure, []  palpitations, []  shortness of breath when laying flat, []  shortness of breath with exertion,  []  pain in feet when walking, []  pain in feet when laying flat, [x]  history of blood clot in veins (DVT), []  history of phlebitis, [x]  swelling in legs, [x]  varicose veins  PULMONARY:  []  productive cough, []  asthma, []  wheezing  NEUROLOGIC:  []  weakness in arms or legs, []  numbness in arms or legs, []  difficulty speaking or slurred speech, []  temporary loss of vision in one eye, []  dizziness  HEMATOLOGIC:  []  bleeding problems, []  problems with blood clotting too easily  MUSCULOSKEL:  []  joint pain, []  joint swelling  GASTROINTEST:  []  vomiting blood, []  blood in stool      GENITOURINARY:  []  burning with urination, []  blood in urine  PSYCHIATRIC:  []  history of major depression  INTEGUMENTARY:  []  rashes, []  ulcers  CONSTITUTIONAL:  []  fever, []  chills   Physical Examination Filed Vitals:   04/05/14 1010  BP: 118/73  Pulse: 54  Height: 5' (1.524 m)  Weight: 118 lb 3.2 oz (53.615 kg)  SpO2: 100%   Body mass index is 23.08 kg/(m^2).  General: A&O x 3, WDWN  Head: [place]/AT  Ear/Nose/Throat: Hearing grossly intact, nares w/o erythema or drainage, oropharynx w/o Erythema/Exudate  Eyes: PERRLA, EOMI  Neck: Supple, no nuchal rigidity, no palpable LAD  Pulmonary: Sym exp, good air movt, CTAB, no rales, rhonchi, & wheezing  Cardiac: RRR, Nl S1, S2, no Murmurs, rubs or gallops  Vascular: Vessel Right Left  Radial Palpable Palpable  Brachial Palpable Palpable  Carotid Palpable, without bruit Palpable, without bruit  Aorta Not palpable N/A  Femoral Palpable Palpable  Popliteal Not palpable Not palpable  PT Faintly Palpable Faintly Palpable  DP Faintly Palpable Faintly Palpable   Gastrointestinal: soft, NTND, -G/R, - HSM, - masses, - CVAT B  Musculoskeletal: M/S 5/5 throughout , Extremities without ischemic changes , cluster of varicosities in posterior left calf, tender to touch, L LDS  Neurologic: CN 2-12 intact , Pain and light touch intact in extremities , Motor exam as listed above  Psychiatric: Judgment intact, Mood & affect appropriate for pt's clinical situation  Dermatologic: See M/S exam for extremity exam, no rashes otherwise noted  Lymph : No Cervical, Axillary, or Inguinal lymphadenopathy   Non-Invasive Vascular Imaging  LLE Venous Insufficiency Duplex (Date: 04/05/2014):   no DVT, +SVT @ SFJ, no GSV reflux, + deep venous reflux: SFV, pop; SSV reflux: 1.1 cm  Outside Studies/Documentation 4 pages of outside documents were reviewed including: outpatient PCP.  Medical Decision Making  Shrita Thien is a 80 y.o.  female who presents with: LLE chronic venous insufficiency (C4), chronic vs acute SFJ thrombus   Based on the patient's history and examination, I recommend: continue compressive therapy.  Pt meets criteria for L SSV EVLA vs stab phlebectomy.  As she is primarly sx from this vein distribution, I suspect she would benefit from intervention.  It's not clear to me if this SFJ thrombus is chronic or acute.  If acute, could consider for short term anticoagulation.  Will defer to Dr. Arbie Cookey.  Thank you for allowing [place] to participate in this patient's care.  Leonides Sake, MD [facility] Office: [phone] Pager: [phone]  04/05/2014, 10:59 AM

## 2014-04-10 ENCOUNTER — Ambulatory Visit (INDEPENDENT_AMBULATORY_CARE_PROVIDER_SITE_OTHER): Payer: Medicare HMO | Admitting: Internal Medicine

## 2014-04-10 ENCOUNTER — Encounter: Payer: Self-pay | Admitting: Internal Medicine

## 2014-04-10 VITALS — BP 143/66 | HR 57 | Temp 98.4°F | Ht 60.0 in | Wt 120.8 lb

## 2014-04-10 DIAGNOSIS — R519 Headache, unspecified: Secondary | ICD-10-CM

## 2014-04-10 DIAGNOSIS — E7800 Pure hypercholesterolemia, unspecified: Secondary | ICD-10-CM

## 2014-04-10 DIAGNOSIS — I350 Nonrheumatic aortic (valve) stenosis: Secondary | ICD-10-CM

## 2014-04-10 DIAGNOSIS — I10 Essential (primary) hypertension: Secondary | ICD-10-CM

## 2014-04-10 DIAGNOSIS — I872 Venous insufficiency (chronic) (peripheral): Secondary | ICD-10-CM

## 2014-04-10 DIAGNOSIS — R51 Headache: Secondary | ICD-10-CM

## 2014-04-10 DIAGNOSIS — F419 Anxiety disorder, unspecified: Secondary | ICD-10-CM

## 2014-04-10 MED ORDER — PROPRANOLOL HCL 40 MG PO TABS: 40.0000 mg | ORAL_TABLET | Freq: Two times a day (BID) | ORAL | Status: DC

## 2014-04-10 NOTE — Patient Instructions (Signed)
General Instructions: 1. [name], please keep your appointment with Dr. Arbie CookeyEarly next week to talk about your options for your leg pain.  Continue to wear your compression stockings for now.  You can continue Tylenol and propranolol for headache pain.  You are scheduled to be seen in headache clinic on May 28, 2014.  I will see you again in 3 months.   2. Please take all medications as prescribed.  You have refills waiting for you at your pharmacy.   3. If you have worsening of your symptoms or new symptoms arise, please call the clinic [phone]), or go to the ER immediately if symptoms are severe.    Thank you for bringing your medicines today. This helps us keep you safe from mistakes.   Progress Toward Treatment Goals:  Treatment Goal 04/10/2014  Blood pressure at goal    Self Care Goals & Plans:  Self Care Goal 04/10/2014  Manage my medications take my medicines as prescribed; bring my medications to every visit; refill my medications on time  Monitor my health -  Eat healthy foods drink diet soda or water instead of juice or soda; eat more vegetables; eat foods that are low in salt; eat baked foods instead of fried foods  Be physically active -  Meeting treatment goals -    No flowsheet data found.   Care Management & Community Referrals:  Referral 03/07/2014  Referrals made for care management support none needed  Referrals made to community resources none

## 2014-04-10 NOTE — Assessment & Plan Note (Addendum)
BP Readings from Last 3 Encounters:  04/10/14 143/66  04/05/14 118/73  03/07/14 151/83    Lab Results  Component Value Date   NA 142 02/02/2014   K 3.3* 02/02/2014   CREATININE 0.83 02/02/2014    Assessment: Blood pressure control:  fair control, she is nearly 80 so < 150/90 is acceptable. Progress toward BP goal:   at goal  Plan: Medications:  continue current medications:  Amlodipine 10mg  daily, Lasix 60mg  daily, DECREASE propranolol (migraine ppx) from 40mg  TID to 40mg  BID due to low HR and she feels it is not helping her headaches. Educational resources provided: brochure (has information) Self management tools provided:  Other plans: CMP today.  RTC in 3 months.

## 2014-04-10 NOTE — Assessment & Plan Note (Addendum)
Evaluated by Dr. Imogene Burnhen on 04/05/14. She may be a candidate for L SSV ablation vs phlebectomy.  Will see Dr. Arbie CookeyEarly on 04/16/14 to discuss possible intervention.  Continues to wear compression.

## 2014-04-10 NOTE — Assessment & Plan Note (Addendum)
Still gets headaches.  Occur every other day.  Frontal headache that spreads around sides of head. Same headaches present x 10 years.  H/A worse in the AM and at night.  No vision loss.  No N/V.  No allergy symptoms.   + photophobia.  No change in quality.  Comes on gradually.  Lasting for 2-3 hours but eases up with 3 Tylenol (not sure if she means OTC Tylenol or Tylenol #3); pain improves from 100 to 10 with meds per her initial report.  She becomes angry and tearful towards the end of the visit because she feels the clinic is not helping her.  She wants to me to give her something to "help her relax."  I ask her what med she wants and she says her prior dr would give her Tylenol #3.  She tells me she is going to stop taking all of her medications and not go to anymore doctor appointments.  I advised her against that because her other medical problems are well controlled and I am working on getting some assistance with managing her headaches (we have her scheduled for headache clinic visit).  Differential includes chronic daily headache, tension, ?migraine ( limited response to propranolol, + photophobia but bilateral/frontal).  No preceding trauma, no change in intensity, no acute onset, not related to activity, no AMS, no vision change or other neuro deficits, no weight loss, no fever to suggest more ominous cause like SAH, tumor or infection.  No shoulder or hip weakness, no vision loss and chronicity make GCA less likely.   No unilateral or eye symptoms to suggest cluster.  - decrease propranolol from 40mg  TID to 40mg  BID (with plan to taper to prn for situational anxiety) since she feels it is no longer helping her headaches and her HR has been in the 50s. - continue Tylenol prn; I am hesitant to prescribe narcotics to [name] because review of prior EPIC notes indicates that there have been problems with misuse (see notes under chronic pain), furthermore, she tells me that her former Dr use to prescribe  her Tylenol #3 but then she tells me that the Tylenol #3 (prescribed five days ago by Vascular Surgeon for her leg pain) are not helping her headache pain.  I am not going to prescribe her more of a medication that is not working for her.  No NSAID use because she has hx of melena after increased use of NSAIDs. - she is scheduled for headache clinic on May 28, 2014 - she was referred for CT head by my colleague but there is no report in EPIC; she initially says she had it done but then says she was unsure - to ED for new or worsening symptoms including worsening headache, N/V, vision change

## 2014-04-10 NOTE — Progress Notes (Signed)
   Subjective:    Patient ID: Sherri Mcmillan, female    DOB: 01-Aug-1932, 80 y.o.   MRN: [id number]  HPI Comments: [name] is a 80 year old woman with a PMH of GERD, hiatal hernia (s/p Nissen's fundoplication), esophageal stricture (s/p dilatation), hx of DVT (last year, off coumadin for over 1 year), AS and HTN here for follow-up.  Please see problem based assessment and plan for update of chronic conditions.     Review of Systems  Constitutional: Negative for fever, chills, appetite change and unexpected weight change.  HENT: Negative for sinus pressure and sore throat.   Eyes: Negative for visual disturbance.  Respiratory: Negative for cough and shortness of breath.   Cardiovascular: Positive for leg swelling. Negative for chest pain and palpitations.  Gastrointestinal: Negative for nausea, vomiting, abdominal pain and blood in stool.  Genitourinary: Negative for dysuria and hematuria.  Neurological: Positive for headaches. Negative for syncope and weakness.  Psychiatric/Behavioral: Negative for dysphoric mood. The patient is not nervous/anxious.        Objective:   Physical Exam  Constitutional: She is oriented to person, place, and time. She appears well-developed. No distress.  HENT:  Head: Normocephalic and atraumatic.  Mouth/Throat: Oropharynx is clear and moist. No oropharyngeal exudate.  PERRL on right; left eye with post/op changes s/p cataract surgery   Eyes: EOM are normal.  Neck: Neck supple.  Cardiovascular: Normal rate and regular rhythm.  Exam reveals no gallop and no friction rub.   Murmur heard. Pulmonary/Chest: Effort normal and breath sounds normal. No respiratory distress. She has no wheezes. She has no rales.  Abdominal: Soft. Bowel sounds are normal.  Musculoskeletal: Normal range of motion. She exhibits no edema or tenderness.  + 1 pretibial edema with venous stasis skin changes on left (unchanged from prior exam)   Neurological: She is alert and oriented  to person, place, and time. No cranial nerve deficit.  Strength and sensation intact upper and lower extremities.  She is able to ambulate unassisted without difficulty.  Skin: Skin is warm. She is not diaphoretic.  Psychiatric:  She was initially pleasant but then became angry and tearful, raising her voice and saying that we were doing nothing to help her.   Vitals reviewed.         Assessment & Plan:  Please see problem based assessment and plan.

## 2014-04-11 LAB — COMPLETE METABOLIC PANEL WITH GFR
ALT: 11 U/L (ref 0–35)
AST: 16 U/L (ref 0–37)
Albumin: 4.3 g/dL (ref 3.5–5.2)
Alkaline Phosphatase: 70 U/L (ref 39–117)
BILIRUBIN TOTAL: 0.3 mg/dL (ref 0.2–1.2)
BUN: 19 mg/dL (ref 6–23)
CALCIUM: 9.7 mg/dL (ref 8.4–10.5)
CHLORIDE: 105 meq/L (ref 96–112)
CO2: 26 meq/L (ref 19–32)
Creat: 0.83 mg/dL (ref 0.50–1.10)
GFR, EST AFRICAN AMERICAN: 78 mL/min
GFR, Est Non African American: 67 mL/min
GLUCOSE: 66 mg/dL — AB (ref 70–99)
POTASSIUM: 3.6 meq/L (ref 3.5–5.3)
SODIUM: 138 meq/L (ref 135–145)
TOTAL PROTEIN: 7.2 g/dL (ref 6.0–8.3)

## 2014-04-11 NOTE — Assessment & Plan Note (Addendum)
She became angry and tearful towards the end of the visit.   She is still doing activities she enjoys (singing in a group). She denies SI.   She says she is not interested in returning to mental health at this time.  She reports compliance with Celexa but it is not in her bag of medications that she brought to the visit.   - Celexa was recently refilled, I advised her to continue this med, she is not sure if she left the bottle at home - Will continue to re-address at next visit.

## 2014-04-11 NOTE — Assessment & Plan Note (Signed)
+  S1+S2, AS murmur heard.  Still asymptomatic.  Repeat ECHO before 09/2015.

## 2014-04-11 NOTE — Assessment & Plan Note (Signed)
Lipid Panel     Component Value Date/Time   CHOL 205* 03/01/2014 1042   TRIG 51 03/01/2014 1042   HDL 82 03/01/2014 1042   CHOLHDL 2.5 03/01/2014 1042   VLDL 10 03/01/2014 1042   LDLCALC 113* 03/01/2014 1042    Recent lipid panel reviewed after visit.  I did not have time to discuss this with Sherri Mcmillan today.  Given her age [age]) and lack of CAD, CVA, DM, reasonable LDL 113 and elevated HDL 82,  I will hold off on starting statin as risks may outweigh benefit.  Based on age alone, she is not in a statin benefit group.

## 2014-04-15 ENCOUNTER — Encounter: Payer: Self-pay | Admitting: Vascular Surgery

## 2014-04-15 NOTE — Progress Notes (Signed)
Internal Medicine Clinic Attending  Case discussed with Dr. Wilson at the time of the visit.  We reviewed the resident's history and exam and pertinent patient test results.  I agree with the assessment, diagnosis, and plan of care documented in the resident's note.  

## 2014-04-16 ENCOUNTER — Ambulatory Visit (INDEPENDENT_AMBULATORY_CARE_PROVIDER_SITE_OTHER): Payer: Medicare HMO | Admitting: Vascular Surgery

## 2014-04-16 ENCOUNTER — Encounter: Payer: Self-pay | Admitting: Vascular Surgery

## 2014-04-16 VITALS — BP 140/72 | HR 66 | Resp 14 | Ht 60.0 in | Wt 122.0 lb

## 2014-04-16 DIAGNOSIS — I872 Venous insufficiency (chronic) (peripheral): Secondary | ICD-10-CM

## 2014-04-16 NOTE — Progress Notes (Signed)
Here today for continued discussion of severe venous hypertension in her left leg. She had seen Dr. Imogene Burnhen a week ago. I did review her venous duplex and also reimage her left leg small saphenous vein with SonoSite ultrasound. This shows marked enlargement and marked reflux in her small saphenous vein. She has severe changes of chronic venous hypertension with hemosiderin deposits and thickening on her medial and lateral aspects of her ankle. She has clearly failed conservative treatment with compression garments. I discussed options with her. She does have a history of remote history of DVT but no evidence of this on her recent study. I would recommend laser ablation of her small saphenous vein for improvement of her venous hypertension. She does have varicosities arising off of this and explained that she may require phlebectomy in the future should have marked improvement with ablation of her small saphenous vein. She understands and wished to proceed. She does have issues with chronic anxiety reports severe pain associated with this. The Tylenol with Codeine did not improve this. I did write a prescription for Percocet 5/325 #30 for pain. We will get her ablation coordinated as soon as possible for improvement of her symptoms

## 2014-04-29 ENCOUNTER — Other Ambulatory Visit: Payer: Self-pay | Admitting: *Deleted

## 2014-04-29 DIAGNOSIS — I83892 Varicose veins of left lower extremities with other complications: Secondary | ICD-10-CM

## 2014-05-02 ENCOUNTER — Other Ambulatory Visit: Payer: Self-pay | Admitting: *Deleted

## 2014-05-02 ENCOUNTER — Telehealth: Payer: Self-pay | Admitting: *Deleted

## 2014-05-02 DIAGNOSIS — M25562 Pain in left knee: Secondary | ICD-10-CM

## 2014-05-02 MED ORDER — TRAMADOL HCL 50 MG PO TABS: 50.0000 mg | ORAL_TABLET | Freq: Four times a day (QID) | ORAL | Status: DC | PRN

## 2014-05-02 NOTE — Telephone Encounter (Signed)
Returning [name]'s earlier phone message regarding left leg pain.  Sherri Mcmillan had office visit with Dr. Arbie CookeyEarly on 04-16-2014 and he prescribed Percocet 5/325 #30 for leg pain.  Sherri Mcmillan states she has taken the complete prescription and has had severe left leg pain for 2 days and is unable to sleep at night.  Sherri Mcmillan states she is elevating her legs, wearing her compression hose, and using ice compresses. States that over the counter NSAIDs give her no pain relief.  Sherri Mcmillan is scheduled for EVLA left small saphenous vein on 05-09-2014 to be done by Gretta Beganodd Early MD.  Venous reflux study on 04-05-2014 at [facility] vascular lab showed no evidence of DVT. Encouraged Sherri Mcmillan to continue elevating her legs, wearing compression hose and using ice compresses as needed.  No physician is in office today so [address] (via telephone) [address] [facility] [address] (pharmacist Kristen at 12:55PM) Ultram 50 mg. #30 refill 1 with instructions to take 1-2 tablets every 6-8 hours as needed for pain. Encouraged Sherri Mcmillan to call [facility] if she had worsening of symptoms or if she had further questions or concerns.  Sherri Mcmillan verbalized understanding.

## 2014-05-08 ENCOUNTER — Encounter: Payer: Self-pay | Admitting: Vascular Surgery

## 2014-05-09 ENCOUNTER — Other Ambulatory Visit: Payer: Self-pay | Admitting: *Deleted

## 2014-05-09 ENCOUNTER — Ambulatory Visit (INDEPENDENT_AMBULATORY_CARE_PROVIDER_SITE_OTHER): Payer: Medicare HMO | Admitting: Vascular Surgery

## 2014-05-09 ENCOUNTER — Encounter: Payer: Self-pay | Admitting: Vascular Surgery

## 2014-05-09 VITALS — BP 174/65 | HR 65 | Resp 18 | Wt 122.0 lb

## 2014-05-09 DIAGNOSIS — I83899 Varicose veins of unspecified lower extremities with other complications: Secondary | ICD-10-CM | POA: Insufficient documentation

## 2014-05-09 DIAGNOSIS — I83892 Varicose veins of left lower extremities with other complications: Secondary | ICD-10-CM

## 2014-05-09 DIAGNOSIS — M25562 Pain in left knee: Secondary | ICD-10-CM

## 2014-05-09 HISTORY — PX: ENDOVENOUS ABLATION SAPHENOUS VEIN W/ LASER: SUR449

## 2014-05-09 MED ORDER — IBUPROFEN 800 MG PO TABS: ORAL_TABLET | ORAL | Status: DC

## 2014-05-09 NOTE — Progress Notes (Signed)
   Laser Ablation Procedure    Date: 05/09/2014    Sherri ShieldsMattie Mcmillan DOB:24-Feb-1933  Consent signed: Yes  SurgeonT.F. Early  Procedure: Laser Ablation: left Small Saphenous Vein  BP 174/65 mmHg  Pulse 65  Resp 18  Wt 122 lb (55.339 kg)  LMP 04/26/1950  Start:840    End time: 922  Tumescent Anesthesia: 300 cc 0.9% NaCl with 50 cc Lidocaine HCL with 1% Epi and 15 cc 8.4% NaHCO3  Local Anesthesia: 2 cc Lidocaine HCL and NaHCO3 (ratio 2:1)  Pulsed mode:15 watts, 500ms delay, 1.0 duration and Total energy: 1, Total pulses: 1, Total time: 1:06  Total energy 986     Patient tolerated procedure well: Yes  Notes: the patient was extremely anxious for the procedure. Did well but was a very reactive to the local anesthesia. Careful. Was comfortable throughout the ablation and was comfortable and discharged.  Description of Procedure:  After marking the course of the secondary varicosities, the patient was placed on the operating table in the prone position, and the left leg was prepped and draped in sterile fashion.   Local anesthetic was administered and under ultrasound guidance the saphenous vein was accessed with a micro needle and guide wire; then the mirco puncture sheath was place.  A guide wire was inserted saphenopopliteal junction , followed by a 5 french sheath.  The position of the sheath and then the laser fiber below the junction was confirmed using the ultrasound.  Tumescent anesthesia was administered along the course of the saphenous vein using ultrasound guidance. The patient was placed in Trendelenburg position and protective laser glasses were placed on patient and staff, and the laser was fired at at 15 watt continuous mode for a total of 986 joules.     Steri strips were applied to the stab wounds and ABD pads and thigh high compression stockings were applied.  Ace wrap bandages were applied over the phlebectomy sites and at the top of the saphenopopliteal junction. Blood  loss was less than 15 cc.  The patient ambulated out of the operating room having tolerated the procedure well.

## 2014-05-10 ENCOUNTER — Telehealth: Payer: Self-pay | Admitting: *Deleted

## 2014-05-10 ENCOUNTER — Encounter: Payer: Self-pay | Admitting: Vascular Surgery

## 2014-05-10 NOTE — Telephone Encounter (Signed)
    05/10/2014  Time: 11:23 AM   Patient Name: Sherri Mcmillan  Patient of: T.F. Early  Procedure:Laser Ablation left small saphenous vein 05-09-2014  Was not able to reach Ms. Wrede at home but left a detailed message on her telephone (cell phone) voice mail.     Comments/Actions Taken: Left detailed phone message on Mrs. Magos's cell phone reminding her to keep compression dressing on her left leg until Saturday (05-11-2014) at 10:00AM and after removing her compression dressing and showering to put on her thigh high compression hose and wear them daytime for 2 weeks.  Reminded her to take Ibuprofen 800 mg (1 tablet) three times daily (every 6 hours) with a full meal and to keep her left leg elevated when sitting.  Reminded of post laser ablation duplex (ultrasound) and [facility] FU with Dr. Arbie CookeyEarly on 05-21-2014.  Asked that she call [facility] if she had further questions or concerns.        @SIGNATURE @

## 2014-05-20 ENCOUNTER — Encounter: Payer: Self-pay | Admitting: Vascular Surgery

## 2014-05-21 ENCOUNTER — Ambulatory Visit (INDEPENDENT_AMBULATORY_CARE_PROVIDER_SITE_OTHER): Payer: Medicare HMO | Admitting: Vascular Surgery

## 2014-05-21 ENCOUNTER — Ambulatory Visit (HOSPITAL_COMMUNITY)
Admission: RE | Admit: 2014-05-21 | Discharge: 2014-05-21 | Disposition: A | Discharge status: Home / Self Care | Payer: Medicare HMO | Source: Ambulatory Visit | Attending: Vascular Surgery | Admitting: Vascular Surgery

## 2014-05-21 ENCOUNTER — Encounter: Payer: Self-pay | Admitting: Vascular Surgery

## 2014-05-21 VITALS — BP 136/79 | HR 67 | Resp 16 | Ht 60.0 in | Wt 122.0 lb

## 2014-05-21 DIAGNOSIS — I83892 Varicose veins of left lower extremities with other complications: Secondary | ICD-10-CM | POA: Diagnosis present

## 2014-05-21 NOTE — Progress Notes (Signed)
Here today for follow-up of laser ablation of her left small saphenous vein on 05/09/2014. She had difficulty with the ibuprofen upsetting her stomach and so therefore was not able to take the usual recommended dosage. Despite this she has done quite well and reports the moderate discomfort associated with the ablation site. She has been compliant with her compression garments.  Past Medical History  Diagnosis Date  . Chronic pain     DJD knees, back, migranes, LLE varicose veins, and LLE neuropathy.   . Aortic stenosis     Dx ECHO 2008 , mild and aymptomatic , needs ECHO q 3-5 yrs  . Barrett's esophagus     Demonstrated on EGD 12/2010. EGD 09/17/2012 shows inflamed GE junctional mucosa without metaplasia, dysplasia, or malignancy.  . Chronic insomnia   . Chronic anxiety     [facility]  [facility])  for 6 weeks after mother died. Complicated again by death of her sister 2010. 2012 developed hallucinations and I refused to refill controlled meds unless she see psych which she is not agreable to  . Elevated cholesterol 10/11    LDL 155. Her 10 year risk (decreasing her age to [age] as the calculator won't go to age 89) is 21% ish. If consider her non smoker (which I wouldn't even though she says 1 pak lasts 2 weeks) risk is 12% ish. So goal for LDL is 130 or 100.  Marland Kitchen. HTN (hypertension)     Controlled with 2 drug therapy  . Cataract   . Depression   . Gastroparesis     Demonstrated on GES 12/2010 by Dr Dalene SeltzerKapland  . Bronchitis     hx of  . Left leg DVT 09/28/2012  . Anemia 12/14/2012  . GERD (gastroesophageal reflux disease)   . H/O hiatal hernia   . Migraine     sometimes  . DVT (deep venous thrombosis)     History  Substance Use Topics  . Smoking status: Former Smoker -- 50 years    Types: Cigarettes    Quit date: 06/01/2010  . Smokeless tobacco: Never Used  . Alcohol Use: No     Comment: quit 24 years ago    Family History  Problem Relation Age of Onset  . Stroke Neg Hx    . Cancer Neg Hx   . Colon cancer Neg Hx   . Anesthesia problems Neg Hx   . Hypotension Neg Hx   . Malignant hyperthermia Neg Hx   . Pseudochol deficiency Neg Hx   . Heart disease Mother   . Hypertension Mother   . Heart attack Mother   . Hypertension Father   . Heart attack Father   . Diabetes Brother     Allergies  Allergen Reactions  . Aspirin Rash     Current outpatient prescriptions:  .  acetaminophen-codeine (TYLENOL #3) 300-30 MG per tablet, Take 1-2 tablets by mouth every 4 (four) hours as needed for moderate pain., Disp: 30 tablet, Rfl: 0 .  albuterol (PROVENTIL HFA;VENTOLIN HFA) 108 (90 BASE) MCG/ACT inhaler, Inhale 1-2 puffs into the lungs every 6 (six) hours as needed for wheezing or shortness of breath., Disp: 1 Inhaler, Rfl: 0 .  alendronate (FOSAMAX) 70 MG tablet, Take 1 tablet (70 mg total) by mouth once a week. Take with a full glass of water on an empty stomach., Disp: 4 tablet, Rfl: 3 .  amLODipine (NORVASC) 10 MG tablet, TAKE 1 TABLET (10 MG TOTAL) BY MOUTH DAILY WITH BREAKFAST.,  Disp: 30 tablet, Rfl: 2 .  benzonatate (TESSALON) 100 MG capsule, Take 1 capsule (100 mg total) by mouth every 8 (eight) hours., Disp: 21 capsule, Rfl: 0 .  Calcium Carb-Cholecalciferol 600-800 MG-UNIT TABS, Take 600-800 mg by mouth 2 (two) times daily., Disp: 60 tablet, Rfl: 2 .  Capsaicin 0.1 % CREA, Apply 1 applicator topically 3 (three) times daily., Disp: 42.5 g, Rfl: 0 .  citalopram (CELEXA) 40 MG tablet, Take 1 tablet (40 mg total) by mouth daily., Disp: 90 tablet, Rfl: 1 .  docusate sodium ([facility] STOOL SOFTENER) 100 MG capsule, Take 100 mg by mouth daily as needed for mild constipation., Disp: , Rfl:  .  esomeprazole (NEXIUM) 20 MG capsule, Take 20 mg by mouth daily at 12 noon., Disp: , Rfl:  .  furosemide (LASIX) 40 MG tablet, TAKE 1.5 TABLETS (60 MG TOTAL) BY MOUTH DAILY WITH BREAKFAST., Disp: 30 tablet, Rfl: 2 .  ibuprofen (ADVIL,MOTRIN) 800 MG tablet, Take 1 tablet every 6 hours  as needed for leg pain., Disp: 30 tablet, Rfl: 0 .  lidocaine (XYLOCAINE) 2 % solution, Use as directed 20 mLs in the mouth or throat as needed for mouth pain., Disp: 100 mL, Rfl: 0 .  propranolol (INDERAL) 40 MG tablet, Take 1 tablet (40 mg total) by mouth 2 (two) times daily., Disp: 60 tablet, Rfl: 1 .  traMADol (ULTRAM) 50 MG tablet, Take 1 tablet (50 mg total) by mouth every 6 (six) hours as needed., Disp: 30 tablet, Rfl: 1  BP 136/79 mmHg  Pulse 67  Resp 16  Ht 5' (1.524 m)  Wt 122 lb (55.339 kg)  BMI 23.83 kg/m2  LMP 04/26/1950  Body mass index is 23.83 kg/(m^2).       On physical exam she is in no distress. She does have an easily palpable thrombosed small saphenous vein in her left posterior calf and popliteal area. She has had dramatic decompression of the large varicosities around the level of her posterior ankle  She underwent venous duplex today and I reviewed this with her. This does show closure of her small saphenous vein with no evidence of DVT  Impression and plan excellent  result from laser ablation of left small saphenous vein. She has had outstanding decompression of the large varices in her distal posterior calf and ankle region. She will continue to wear her compression for one additional week and then on as-needed basis. She will see [place] as needed

## 2014-05-26 ENCOUNTER — Other Ambulatory Visit: Payer: Self-pay | Admitting: Vascular Surgery

## 2014-06-18 ENCOUNTER — Encounter: Payer: Self-pay | Admitting: Internal Medicine

## 2014-06-18 NOTE — Addendum Note (Signed)
Addended by: Neomia DearPOWERS,  E on: 06/18/2014 05:24 PM   Modules accepted: Orders

## 2014-06-28 ENCOUNTER — Other Ambulatory Visit: Payer: Self-pay | Admitting: Internal Medicine

## 2014-09-02 ENCOUNTER — Inpatient Hospital Stay (HOSPITAL_COMMUNITY)
Admission: EM | Admit: 2014-09-02 | Discharge: 2014-09-07 | DRG: 192 | Disposition: A | Discharge status: Home / Self Care | Payer: Medicare HMO | Attending: Internal Medicine | Admitting: Internal Medicine

## 2014-09-02 ENCOUNTER — Emergency Department (HOSPITAL_COMMUNITY): Payer: Medicare HMO

## 2014-09-02 ENCOUNTER — Encounter (HOSPITAL_COMMUNITY): Payer: Self-pay

## 2014-09-02 DIAGNOSIS — Z90710 Acquired absence of both cervix and uterus: Secondary | ICD-10-CM

## 2014-09-02 DIAGNOSIS — E785 Hyperlipidemia, unspecified: Secondary | ICD-10-CM | POA: Diagnosis present

## 2014-09-02 DIAGNOSIS — J440 Chronic obstructive pulmonary disease with acute lower respiratory infection: Secondary | ICD-10-CM | POA: Diagnosis present

## 2014-09-02 DIAGNOSIS — Z886 Allergy status to analgesic agent status: Secondary | ICD-10-CM

## 2014-09-02 DIAGNOSIS — F419 Anxiety disorder, unspecified: Secondary | ICD-10-CM | POA: Diagnosis present

## 2014-09-02 DIAGNOSIS — Z7952 Long term (current) use of systemic steroids: Secondary | ICD-10-CM

## 2014-09-02 DIAGNOSIS — J441 Chronic obstructive pulmonary disease with (acute) exacerbation: Secondary | ICD-10-CM | POA: Diagnosis not present

## 2014-09-02 DIAGNOSIS — I10 Essential (primary) hypertension: Secondary | ICD-10-CM | POA: Diagnosis present

## 2014-09-02 DIAGNOSIS — Z87891 Personal history of nicotine dependence: Secondary | ICD-10-CM

## 2014-09-02 DIAGNOSIS — Z79899 Other long term (current) drug therapy: Secondary | ICD-10-CM

## 2014-09-02 DIAGNOSIS — J209 Acute bronchitis, unspecified: Secondary | ICD-10-CM | POA: Diagnosis present

## 2014-09-02 DIAGNOSIS — J42 Unspecified chronic bronchitis: Secondary | ICD-10-CM

## 2014-09-02 DIAGNOSIS — K2270 Barrett's esophagus without dysplasia: Secondary | ICD-10-CM | POA: Diagnosis present

## 2014-09-02 DIAGNOSIS — K219 Gastro-esophageal reflux disease without esophagitis: Secondary | ICD-10-CM | POA: Diagnosis present

## 2014-09-02 DIAGNOSIS — I350 Nonrheumatic aortic (valve) stenosis: Secondary | ICD-10-CM | POA: Diagnosis present

## 2014-09-02 DIAGNOSIS — M810 Age-related osteoporosis without current pathological fracture: Secondary | ICD-10-CM | POA: Diagnosis present

## 2014-09-02 DIAGNOSIS — G43909 Migraine, unspecified, not intractable, without status migrainosus: Secondary | ICD-10-CM | POA: Diagnosis present

## 2014-09-02 DIAGNOSIS — F329 Major depressive disorder, single episode, unspecified: Secondary | ICD-10-CM | POA: Diagnosis present

## 2014-09-02 LAB — CBC
HCT: 32.8 % — ABNORMAL LOW (ref 36.0–46.0)
Hemoglobin: 10.6 g/dL — ABNORMAL LOW (ref 12.0–15.0)
MCH: 27.5 pg (ref 26.0–34.0)
MCHC: 32.3 g/dL (ref 30.0–36.0)
MCV: 85 fL (ref 78.0–100.0)
PLATELETS: 144 10*3/uL — AB (ref 150–400)
RBC: 3.86 MIL/uL — AB (ref 3.87–5.11)
RDW: 14.8 % (ref 11.5–15.5)
WBC: 4 10*3/uL (ref 4.0–10.5)

## 2014-09-02 LAB — BASIC METABOLIC PANEL
Anion gap: 8 (ref 5–15)
BUN: 13 mg/dL (ref 6–20)
CALCIUM: 9.2 mg/dL (ref 8.9–10.3)
CO2: 25 mmol/L (ref 22–32)
Chloride: 106 mmol/L (ref 101–111)
Creatinine, Ser: 0.83 mg/dL (ref 0.44–1.00)
GFR calc Af Amer: >60 mL/min (ref >60)
Glucose, Bld: 96 mg/dL (ref 70–99)
Potassium: 3.9 mmol/L (ref 3.5–5.1)
SODIUM: 139 mmol/L (ref 135–145)

## 2014-09-02 NOTE — ED Provider Notes (Addendum)
CSN: [id number]     Arrival date & time 09/02/14  1837 History  This chart was scribed for Tomasita Crumble, MD by Abel Presto, ED Scribe. This patient was seen in room D33C/D33C and the patient's care was started at 12:01 AM.    Chief Complaint  Patient presents with  . Cough  . URI    Patient is a 81 y.o. female presenting with cough and URI. The history is provided by the patient. No language interpreter was used.  Cough URI Presenting symptoms: cough    HPI Comments: Sherri Mcmillan is a 81 y.o. female with PMHx of aortic stenosis, HLD, HTN, Barrett's esophagus, DVT,  who presents to the Emergency Department complaining of productive cough with onset 6 days ago. Pt reports associated headache, SOB, burning pain in throat, dysphonia, and wheezing. She denies recent sick contacts. Pt uses albuterol inhaler at home. Pt denies fever and chills.   Past Medical History  Diagnosis Date  . Chronic pain     DJD knees, back, migranes, LLE varicose veins, and LLE neuropathy.   . Aortic stenosis     Dx ECHO 2008 , mild and aymptomatic , needs ECHO q 3-5 yrs  . Barrett's esophagus     Demonstrated on EGD 12/2010. EGD 09/17/2012 shows inflamed GE junctional mucosa without metaplasia, dysplasia, or malignancy.  . Chronic insomnia   . Chronic anxiety     Admission to [facility] (90s or early 2000)  for 6 weeks after mother died. Complicated again by death of her sister 2010. 2012 developed hallucinations and I refused to refill controlled meds unless she see psych which she is not agreable to  . Elevated cholesterol 10/11    LDL 155. Her 10 year risk (decreasing her age to 39 as the calculator won't go to age 43) is 21% ish. If consider her non smoker (which I wouldn't even though she says 1 pak lasts 2 weeks) risk is 12% ish. So goal for LDL is 130 or 100.  Marland Kitchen HTN (hypertension)     Controlled with 2 drug therapy  . Cataract   . Depression   . Gastroparesis     Demonstrated on GES 12/2010 by Dr Dalene Seltzer  .  Bronchitis     hx of  . Left leg DVT 09/28/2012  . Anemia 12/14/2012  . GERD (gastroesophageal reflux disease)   . H/O hiatal hernia   . Migraine     sometimes  . DVT (deep venous thrombosis)    Past Surgical History  Procedure Laterality Date  . Direct laryngoscopy   September 2008     preoperative diagnosis hoarseness with anterior right vocal cord lesion -  direct laryngoscopy and excisional biopsy of right anterior vocal cord lesion done by Dr. Ezzard Standing  . Meniscectomy   July 2002     preoperative diagnosis torn medial meniscus right knee, partial medial meniscectomy, debridement chondroplasty patellofemoral joint, done by Dr. Madelon Lips  . Abdominal hysterectomy    . Hemorrhoid surgery    . Appendectomy    . Cataract extraction      left eye  . Polypectomy  2000    Dr.Magod  . Colonoscopy  2000&2005    Dr.Magod  . Esophagogastroduodenoscopy  11/2010  . Knee arthroscopy    . Eye surgery    . Rotator cuff repair  09/21/2011    rt shoulder  . Esophagogastroduodenoscopy N/A 09/17/2012    Procedure: ESOPHAGOGASTRODUODENOSCOPY (EGD);  Surgeon: Hart Carwin, MD;  Location: [facility] ENDOSCOPY;  Service:  Endoscopy;  Laterality: N/A;  . Esophagogastroduodenoscopy N/A 12/17/2012    Procedure: ESOPHAGOGASTRODUODENOSCOPY (EGD);  Surgeon: Beverley FiedlerJay M Pyrtle, MD;  [facility] END[facility];  Service: Gastroenterology;  Laterality: N/A;  . Laparoscopic nissen fundoplication N/A 05/02/2013    Procedure: LAPAROSCOPIC NISSEN FUNDOPLICATION;  Surgeon: Valarie MerinoMatthew B Martin, MD;  Location: [facility] ORS;  Service: General;  Laterality: N/A;  . Endovenous ablation saphenous vein w/ laser Left 05-09-2014    EVLA left small saphenous vein by Gretta Beganodd Early MD   Family History  Problem Relation Age of Onset  . Stroke Neg Hx   . Cancer Neg Hx   . Colon cancer Neg Hx   . Anesthesia problems Neg Hx   . Hypotension Neg Hx   . Malignant hyperthermia Neg Hx   . Pseudochol deficiency Neg Hx   . Heart disease Mother   . Hypertension  Mother   . Heart attack Mother   . Hypertension Father   . Heart attack Father   . Diabetes Brother    History  Substance Use Topics  . Smoking status: Former Smoker -- 50 years    Types: Cigarettes    Quit date: 06/01/2010  . Smokeless tobacco: Never Used  . Alcohol Use: No     Comment: quit 24 years ago   OB History    No data available     Review of Systems  Respiratory: Positive for cough.   A complete 10 system review of systems was obtained and all systems are negative except as noted in the HPI and PMH.      Allergies  Aspirin  Home Medications   Prior to Admission medications   Medication Sig Start Date End Date Taking? Authorizing Provider  amLODipine (NORVASC) 10 MG tablet TAKE 1 TABLET (10 MG TOTAL) BY MOUTH DAILY WITH BREAKFAST. 06/28/14  Yes Yolanda MangesAlex M Wilson, DO  esomeprazole (NEXIUM) 20 MG capsule Take 20 mg by mouth daily at 12 noon.   Yes Historical Provider, MD  acetaminophen-codeine (TYLENOL #3) 300-30 MG per tablet Take 1-2 tablets by mouth every 4 (four) hours as needed for moderate pain. Patient not taking: Reported on 09/02/2014 04/05/14   Fransisco HertzBrian L Chen, MD  albuterol (PROVENTIL HFA;VENTOLIN HFA) 108 (90 BASE) MCG/ACT inhaler Inhale 1-2 puffs into the lungs every 6 (six) hours as needed for wheezing or shortness of breath. Patient not taking: Reported on 09/02/2014 02/02/14   Toni Amendourtney Forcucci, PA-C  alendronate (FOSAMAX) 70 MG tablet Take 1 tablet (70 mg total) by mouth once a week. Take with a full glass of water on an empty stomach. Patient not taking: Reported on 09/02/2014 03/01/14   Annett Gularacy N McLean, MD  benzonatate (TESSALON) 100 MG capsule Take 1 capsule (100 mg total) by mouth every 8 (eight) hours. Patient not taking: Reported on 09/02/2014 02/02/14   Terri Piedraourtney Forcucci, PA-C  Calcium Carb-Cholecalciferol 600-800 MG-UNIT TABS Take 600-800 mg by mouth 2 (two) times daily. Patient not taking: Reported on 09/02/2014 03/01/14   Annett Gularacy N McLean, MD  Capsaicin 0.1 %  CREA Apply 1 applicator topically 3 (three) times daily. Patient not taking: Reported on 09/02/2014 03/07/14   Annett Gularacy N McLean, MD  citalopram (CELEXA) 40 MG tablet Take 1 tablet (40 mg total) by mouth daily. Patient not taking: Reported on 09/02/2014 03/01/14   Annett Gularacy N McLean, MD  furosemide (LASIX) 40 MG tablet TAKE 1.5 TABLETS (60 MG TOTAL) BY MOUTH DAILY WITH BREAKFAST. Patient not taking: Reported on 09/02/2014 03/02/14   Yolanda MangesAlex M Wilson, DO  ibuprofen (  ADVIL,MOTRIN) 800 MG tablet Take 1 tablet every 6 hours as needed for leg pain. Patient not taking: Reported on 09/02/2014 05/09/14   Larina Earthlyodd F Early, MD  lidocaine (XYLOCAINE) 2 % solution Use as directed 20 mLs in the mouth or throat as needed for mouth pain. Patient not taking: Reported on 09/02/2014 02/02/14   Toni Amendourtney Forcucci, PA-C  propranolol (INDERAL) 40 MG tablet Take 1 tablet (40 mg total) by mouth 2 (two) times daily. Patient not taking: Reported on 09/02/2014 04/10/14   Yolanda MangesAlex M Wilson, DO  traMADol (ULTRAM) 50 MG tablet Take 1 tablet (50 mg total) by mouth every 6 (six) hours as needed. Patient not taking: Reported on 09/02/2014 05/02/14   Larina Earthlyodd F Early, MD   BP 144/78 mmHg  Pulse 84  Temp(Src) 98.9 F (37.2 C) (Oral)  Resp 18  Ht 5' (1.524 m)  Wt 110 lb (49.896 kg)  BMI 21.48 kg/m2  SpO2 100%  LMP 04/26/1950 Physical Exam  Constitutional: She is oriented to person, place, and time. She appears well-developed and well-nourished. No distress.  HENT:  Head: Normocephalic and atraumatic.  Nose: Nose normal.  Mouth/Throat: Oropharynx is clear and moist. No oropharyngeal exudate.  Eyes: Conjunctivae and EOM are normal. Pupils are equal, round, and reactive to light. No scleral icterus.  Neck: Normal range of motion. Neck supple. No JVD present. No tracheal deviation present. No thyromegaly present.  Cardiovascular: Normal rate, regular rhythm and normal heart sounds.  Exam reveals no gallop and no friction rub.   No murmur heard. Pulmonary/Chest:  Effort normal. Tachypnea (mild) noted. No respiratory distress. She has wheezes (inspiratory and expiratory). She exhibits no tenderness.  Abdominal: Soft. Bowel sounds are normal. She exhibits no distension and no mass. There is no tenderness. There is no rebound and no guarding.  Musculoskeletal: Normal range of motion. She exhibits edema (trace). She exhibits no tenderness.  Lymphadenopathy:    She has no cervical adenopathy.  Neurological: She is alert and oriented to person, place, and time. No cranial nerve deficit. She exhibits normal muscle tone.  Skin: Skin is warm and dry. No rash noted. No erythema. No pallor.  Nursing note and vitals reviewed.   ED Course  Procedures (including critical care time) DIAGNOSTIC STUDIES: Oxygen Saturation is 100% on room air, normal by my interpretation.    COORDINATION OF CARE: 12:05 AM Discussed treatment plan with patient at beside, the patient agrees with the plan and has no further questions at this time.   Labs Review Labs Reviewed  CBC - Abnormal; Notable for the following:    RBC 3.86 (*)    Hemoglobin 10.6 (*)    HCT 32.8 (*)    Platelets 144 (*)    All other components within normal limits  BASIC METABOLIC PANEL    Imaging Review Dg Chest 2 View (if Patient Has Fever And/or Copd)  09/02/2014   CLINICAL DATA:  Chest pain, cough and shortness of breath for the past 4 days. Ex-smoker.  EXAM: CHEST  2 VIEW  COMPARISON:  02/02/2014 and chest CTA dated 02/02/2014.  FINDINGS: Normal sized heart. Clear lungs. The lungs remain mildly hyperexpanded with minimal diffuse peribronchial thickening and accentuation of the interstitial markings. Small to moderate-sized hiatal hernia. Unremarkable bones.  IMPRESSION: 1. No acute abnormality. 2. Stable mild changes of COPD and chronic bronchitis. 3. Small to moderate-sized hiatal hernia.   Electronically Signed   By: Beckie SaltsSteven  Reid M.D.   On: 09/02/2014 20:24     EKG Interpretation  None      MDM    Final diagnoses:  None   Patient since emergency department for productive cough, wheezing for the past 3-4 days. She also has sore throat associated with her cough. She's been using her inhaler at home without any relief. Physical exam reveals diffuse wheezing. Patient was ordered hour-long continuous albuterol, ipratropium, prednisone, magnesium for treatment. Chest x-ray is negative for pneumonia.  Repeat evaluation reveals the patient's respiratory status is improving. She has minimal wheezes currently. Patient is refusing prednisone because she states it makes her fat. She is willing to take a Decadron intramuscular injection. This was ordered for her.  Patient now refuses Decadron injection. She took the prednisone. Also was given azithromycin for COPD exacerbation. She'll be discharged with prescriptions for both.  Patient continues to have wheezing on exam and appears tachypneic. She'll require admission for further management.  I spoke with the internal medicine teaching service who will admit the patient to the hospital.   CRITICAL CARE Performed by: Tomasita Crumble   Total critical care time: - respiratory distress  Critical care time was exclusive of separately billable procedures and treating other patients.  Critical care was necessary to treat or prevent imminent or life-threatening deterioration.  Critical care was time spent personally by me on the following activities: development of treatment plan with patient and/or surrogate as well as nursing, discussions with consultants, evaluation of patient's response to treatment, examination of patient, obtaining history from patient or surrogate, ordering and performing treatments and interventions, ordering and review of laboratory studies, ordering and review of radiographic studies, pulse oximetry and re-evaluation of patient's condition.   I personally performed the services described in this documentation, which was  scribed in my presence. The recorded information has been reviewed and is accurate.   Tomasita Crumble, MD 09/03/14 1610  Tomasita Crumble, MD 09/03/14 [id number]

## 2014-09-02 NOTE — ED Notes (Signed)
Pt here for cough, wheezing onset three days, pt has hoarseness noted, no relief with otc meds, sts that preacher has walking pna and she is concerned for having that. Pt reports headache also. nad noted,

## 2014-09-02 NOTE — ED Notes (Signed)
Called for triage x1.

## 2014-09-03 DIAGNOSIS — K219 Gastro-esophageal reflux disease without esophagitis: Secondary | ICD-10-CM | POA: Diagnosis not present

## 2014-09-03 DIAGNOSIS — I10 Essential (primary) hypertension: Secondary | ICD-10-CM

## 2014-09-03 DIAGNOSIS — J441 Chronic obstructive pulmonary disease with (acute) exacerbation: Principal | ICD-10-CM

## 2014-09-03 DIAGNOSIS — G43909 Migraine, unspecified, not intractable, without status migrainosus: Secondary | ICD-10-CM | POA: Diagnosis not present

## 2014-09-03 DIAGNOSIS — M810 Age-related osteoporosis without current pathological fracture: Secondary | ICD-10-CM

## 2014-09-03 DIAGNOSIS — Z79899 Other long term (current) drug therapy: Secondary | ICD-10-CM

## 2014-09-03 DIAGNOSIS — F329 Major depressive disorder, single episode, unspecified: Secondary | ICD-10-CM

## 2014-09-03 DIAGNOSIS — Z87891 Personal history of nicotine dependence: Secondary | ICD-10-CM

## 2014-09-03 MED ORDER — IPRATROPIUM BROMIDE 0.02 % IN SOLN
0.5000 mg | RESPIRATORY_TRACT | Status: DC
  Administered 2014-09-03 (×3): 0.5 mg via RESPIRATORY_TRACT
  Filled 2014-09-03 (×3): qty 2.5

## 2014-09-03 MED ORDER — DEXAMETHASONE SODIUM PHOSPHATE 10 MG/ML IJ SOLN: 10.0000 mg | Freq: Once | INTRAMUSCULAR | Status: DC

## 2014-09-03 MED ORDER — PANTOPRAZOLE SODIUM 40 MG PO TBEC
40.0000 mg | DELAYED_RELEASE_TABLET | Freq: Every day | ORAL | Status: DC
  Administered 2014-09-03 – 2014-09-07 (×5): 40 mg via ORAL
  Filled 2014-09-03 (×5): qty 1

## 2014-09-03 MED ORDER — FUROSEMIDE 40 MG PO TABS
60.0000 mg | ORAL_TABLET | Freq: Every day | ORAL | Status: DC
  Administered 2014-09-03 – 2014-09-07 (×5): 60 mg via ORAL
  Filled 2014-09-03 (×10): qty 1

## 2014-09-03 MED ORDER — ENOXAPARIN SODIUM 30 MG/0.3ML ~~LOC~~ SOLN: 30.0000 mg | SUBCUTANEOUS | Status: DC

## 2014-09-03 MED ORDER — IPRATROPIUM-ALBUTEROL 0.5-2.5 (3) MG/3ML IN SOLN
3.0000 mL | RESPIRATORY_TRACT | Status: DC
  Administered 2014-09-03: 3 mL via RESPIRATORY_TRACT
  Filled 2014-09-03: qty 3

## 2014-09-03 MED ORDER — AMLODIPINE BESYLATE 10 MG PO TABS
10.0000 mg | ORAL_TABLET | Freq: Every day | ORAL | Status: DC
  Administered 2014-09-03 – 2014-09-07 (×5): 10 mg via ORAL
  Filled 2014-09-03 (×5): qty 1

## 2014-09-03 MED ORDER — SODIUM CHLORIDE 0.9 % IV BOLUS (SEPSIS)
1000.0000 mL | Freq: Once | INTRAVENOUS | Status: AC
  Administered 2014-09-03: 1000 mL via INTRAVENOUS

## 2014-09-03 MED ORDER — BUDESONIDE 0.25 MG/2ML IN SUSP
0.2500 mg | Freq: Two times a day (BID) | RESPIRATORY_TRACT | Status: DC
  Administered 2014-09-03 – 2014-09-07 (×9): 0.25 mg via RESPIRATORY_TRACT
  Filled 2014-09-03 (×9): qty 2

## 2014-09-03 MED ORDER — IPRATROPIUM BROMIDE 0.02 % IN SOLN
0.5000 mg | Freq: Three times a day (TID) | RESPIRATORY_TRACT | Status: DC
  Administered 2014-09-04 – 2014-09-07 (×10): 0.5 mg via RESPIRATORY_TRACT
  Filled 2014-09-03 (×10): qty 2.5

## 2014-09-03 MED ORDER — ALBUTEROL SULFATE HFA 108 (90 BASE) MCG/ACT IN AERS
2.0000 | INHALATION_SPRAY | Freq: Once | RESPIRATORY_TRACT | Status: AC
  Administered 2014-09-03: 2 via RESPIRATORY_TRACT
  Filled 2014-09-03: qty 6.7

## 2014-09-03 MED ORDER — PREDNISONE 20 MG PO TABS
60.0000 mg | ORAL_TABLET | Freq: Once | ORAL | Status: AC
  Administered 2014-09-03: 60 mg via ORAL
  Filled 2014-09-03 (×2): qty 3

## 2014-09-03 MED ORDER — LEVALBUTEROL HCL 0.63 MG/3ML IN NEBU
0.6300 mg | INHALATION_SOLUTION | RESPIRATORY_TRACT | Status: DC
  Administered 2014-09-03 (×3): 0.63 mg via RESPIRATORY_TRACT
  Filled 2014-09-03 (×3): qty 3

## 2014-09-03 MED ORDER — CITALOPRAM HYDROBROMIDE 40 MG PO TABS
40.0000 mg | ORAL_TABLET | Freq: Every day | ORAL | Status: DC
  Administered 2014-09-03 – 2014-09-07 (×5): 40 mg via ORAL
  Filled 2014-09-03 (×5): qty 1

## 2014-09-03 MED ORDER — ENOXAPARIN SODIUM 40 MG/0.4ML ~~LOC~~ SOLN
40.0000 mg | SUBCUTANEOUS | Status: DC
  Administered 2014-09-03 – 2014-09-06 (×4): 40 mg via SUBCUTANEOUS
  Filled 2014-09-03 (×4): qty 0.4

## 2014-09-03 MED ORDER — LEVALBUTEROL HCL 0.63 MG/3ML IN NEBU
0.6300 mg | INHALATION_SOLUTION | RESPIRATORY_TRACT | Status: DC | PRN
  Administered 2014-09-06: 0.63 mg via RESPIRATORY_TRACT
  Filled 2014-09-03 (×2): qty 3

## 2014-09-03 MED ORDER — ALENDRONATE SODIUM 70 MG PO TABS: 70.0000 mg | ORAL_TABLET | ORAL | Status: DC

## 2014-09-03 MED ORDER — PREDNISONE 20 MG PO TABS
40.0000 mg | ORAL_TABLET | Freq: Every day | ORAL | Status: DC
  Administered 2014-09-03 – 2014-09-04 (×2): 40 mg via ORAL
  Filled 2014-09-03 (×2): qty 2

## 2014-09-03 MED ORDER — GUAIFENESIN-CODEINE 100-10 MG/5ML PO SOLN
5.0000 mL | ORAL | Status: DC | PRN
  Administered 2014-09-03 – 2014-09-06 (×5): 5 mL via ORAL
  Filled 2014-09-03 (×5): qty 5

## 2014-09-03 MED ORDER — PROPRANOLOL HCL 40 MG PO TABS
40.0000 mg | ORAL_TABLET | Freq: Two times a day (BID) | ORAL | Status: DC
  Administered 2014-09-03: 40 mg via ORAL
  Filled 2014-09-03 (×2): qty 1

## 2014-09-03 MED ORDER — ACETAMINOPHEN 325 MG PO TABS: 650.0000 mg | ORAL_TABLET | Freq: Four times a day (QID) | ORAL | Status: DC | PRN

## 2014-09-03 MED ORDER — METOCLOPRAMIDE HCL 5 MG/ML IJ SOLN
10.0000 mg | Freq: Once | INTRAMUSCULAR | Status: AC
  Administered 2014-09-03: 10 mg via INTRAVENOUS
  Filled 2014-09-03: qty 2

## 2014-09-03 MED ORDER — LEVALBUTEROL HCL 0.63 MG/3ML IN NEBU
0.6300 mg | INHALATION_SOLUTION | Freq: Three times a day (TID) | RESPIRATORY_TRACT | Status: DC
  Administered 2014-09-04 – 2014-09-07 (×10): 0.63 mg via RESPIRATORY_TRACT
  Filled 2014-09-03 (×10): qty 3

## 2014-09-03 MED ORDER — ALBUTEROL (5 MG/ML) CONTINUOUS INHALATION SOLN
10.0000 mg/h | INHALATION_SOLUTION | RESPIRATORY_TRACT | Status: DC
  Administered 2014-09-03: 10 mg/h via RESPIRATORY_TRACT

## 2014-09-03 MED ORDER — TRAMADOL HCL 50 MG PO TABS
50.0000 mg | ORAL_TABLET | Freq: Four times a day (QID) | ORAL | Status: DC | PRN
  Administered 2014-09-03 – 2014-09-07 (×12): 50 mg via ORAL
  Filled 2014-09-03 (×13): qty 1

## 2014-09-03 MED ORDER — LIDOCAINE VISCOUS 2 % MT SOLN
20.0000 mL | OROMUCOSAL | Status: DC | PRN
  Filled 2014-09-03: qty 20

## 2014-09-03 MED ORDER — ACETAMINOPHEN 500 MG PO TABS
1000.0000 mg | ORAL_TABLET | Freq: Once | ORAL | Status: AC
  Administered 2014-09-03: 1000 mg via ORAL
  Filled 2014-09-03: qty 2

## 2014-09-03 MED ORDER — ALBUTEROL (5 MG/ML) CONTINUOUS INHALATION SOLN
10.0000 mg/h | INHALATION_SOLUTION | RESPIRATORY_TRACT | Status: DC
  Administered 2014-09-03: 10 mg/h via RESPIRATORY_TRACT
  Filled 2014-09-03: qty 20

## 2014-09-03 MED ORDER — AZITHROMYCIN 250 MG PO TABS: 250.0000 mg | ORAL_TABLET | Freq: Every day | ORAL | Status: DC

## 2014-09-03 MED ORDER — CALCIUM CARBONATE-VITAMIN D 500-200 MG-UNIT PO TABS
1.0000 | ORAL_TABLET | Freq: Two times a day (BID) | ORAL | Status: DC
  Administered 2014-09-03 – 2014-09-07 (×9): 1 via ORAL
  Filled 2014-09-03 (×9): qty 1

## 2014-09-03 MED ORDER — CEFTRIAXONE SODIUM IN DEXTROSE 20 MG/ML IV SOLN
1.0000 g | INTRAVENOUS | Status: DC
  Administered 2014-09-03 – 2014-09-04 (×2): 1 g via INTRAVENOUS
  Filled 2014-09-03 (×2): qty 50

## 2014-09-03 MED ORDER — ACETAMINOPHEN 650 MG RE SUPP: 650.0000 mg | Freq: Four times a day (QID) | RECTAL | Status: DC | PRN

## 2014-09-03 MED ORDER — IPRATROPIUM BROMIDE 0.02 % IN SOLN
1.0000 mg | Freq: Once | RESPIRATORY_TRACT | Status: AC
  Administered 2014-09-03: 1 mg via RESPIRATORY_TRACT
  Filled 2014-09-03: qty 5

## 2014-09-03 MED ORDER — CAPSAICIN 0.075 % EX CREA
1.0000 "application " | TOPICAL_CREAM | Freq: Three times a day (TID) | CUTANEOUS | Status: DC
  Administered 2014-09-03 – 2014-09-07 (×13): 1 via TOPICAL
  Filled 2014-09-03: qty 60

## 2014-09-03 MED ORDER — PREDNISONE 20 MG PO TABS: 60.0000 mg | ORAL_TABLET | Freq: Every day | ORAL | Status: DC

## 2014-09-03 MED ORDER — AZITHROMYCIN 250 MG PO TABS
500.0000 mg | ORAL_TABLET | Freq: Once | ORAL | Status: AC
  Administered 2014-09-03: 500 mg via ORAL
  Filled 2014-09-03: qty 2

## 2014-09-03 MED ORDER — MAGNESIUM SULFATE 2 GM/50ML IV SOLN
2.0000 g | Freq: Once | INTRAVENOUS | Status: AC
  Administered 2014-09-03: 2 g via INTRAVENOUS
  Filled 2014-09-03: qty 50

## 2014-09-03 MED ORDER — ALBUTEROL SULFATE (2.5 MG/3ML) 0.083% IN NEBU: 2.5000 mg | INHALATION_SOLUTION | RESPIRATORY_TRACT | Status: DC | PRN

## 2014-09-03 MED ORDER — METOCLOPRAMIDE HCL 10 MG PO TABS
10.0000 mg | ORAL_TABLET | Freq: Once | ORAL | Status: DC
  Filled 2014-09-03: qty 1

## 2014-09-03 NOTE — ED Notes (Signed)
Pt tearful on discharge; reports headache has not improved. Asked patient if she felt well enough to be discharged; pt agreed; requesting pain medication prescription for headache.

## 2014-09-03 NOTE — ED Notes (Signed)
Admitting MD at BS.  

## 2014-09-03 NOTE — ED Notes (Signed)
Pt now requesting to take prednisone after speaking with MD. Reports not wanting to have an injection

## 2014-09-03 NOTE — ED Notes (Signed)
On discharge, pt requesting pain medication for headache. MD made aware

## 2014-09-03 NOTE — Progress Notes (Signed)
Subjective: No acute events overnight since admission. [name] says that she feels her SOB has improved but is not back to baseline. She says that she still has a cough productive of white sputum but that it is somewhat improved from yesterday. No chest pain, fevers, chills, night sweats.  Objective: Vital signs in last 24 hours: Filed Vitals:   09/03/14 0600 09/03/14 0618 09/03/14 0732 09/03/14 0926  BP: 123/65  109/60 102/61  Pulse: 100  92 91  Temp:   98.1 F (36.7 C) 98.1 F (36.7 C)  TempSrc:   Oral Oral  Resp: 20  19 18   Height:  5' (1.524 m)    Weight:  121 lb 0.5 oz (54.9 kg)    SpO2: 96%  97% 100%   Weight change:   Intake/Output Summary (Last 24 hours) at 09/03/14 1121 Last data filed at 09/03/14 0900  Gross per 24 hour  Intake   1360 ml  Output      0 ml  Net   1360 ml   Gen: A&O x 4, No acute distress, well developed, well nourished HEENT: Atraumatic, PERRL, EOMI, sclerae anicteric, moist mucous membranes Heart: Regular rate and rhythm, normal S1 S2, 3/6 systolic ejection murmur Lungs: scattered wheezes b/l, respirations unlabored Abd: Soft, non-tender, non-distended, + bowel sounds, no hepatosplenomegaly Ext: No edema or cyanosis  Lab Results: Basic Metabolic Panel:  Recent Labs Lab 09/02/14 1922  NA 139  K 3.9  CL 106  CO2 25  GLUCOSE 96  BUN 13  CREATININE 0.83  CALCIUM 9.2   CBC:  Recent Labs Lab 09/02/14 1922  WBC 4.0  HGB 10.6*  HCT 32.8*  MCV 85.0  PLT 144*    Micro Results: No results found for this or any previous visit (from the past 240 hour(s)). Studies/Results: Dg Chest 2 View (if Patient Has Fever And/or Copd)  09/02/2014   CLINICAL DATA:  Chest pain, cough and shortness of breath for the past 4 days. Ex-smoker.  EXAM: CHEST  2 VIEW  COMPARISON:  02/02/2014 and chest CTA dated 02/02/2014.  FINDINGS: Normal sized heart. Clear lungs. The lungs remain mildly hyperexpanded with minimal diffuse peribronchial thickening and  accentuation of the interstitial markings. Small to moderate-sized hiatal hernia. Unremarkable bones.  IMPRESSION: 1. No acute abnormality. 2. Stable mild changes of COPD and chronic bronchitis. 3. Small to moderate-sized hiatal hernia.   Electronically Signed   By: Beckie SaltsSteven  Reid M.D.   On: 09/02/2014 20:24   Medications: I have reviewed the patient's current medications. Scheduled Meds: . amLODipine  10 mg Oral Daily  . budesonide (PULMICORT) nebulizer solution  0.25 mg Nebulization BID  . calcium-vitamin D  1 tablet Oral BID  . capsicum  1 application Topical TID  . cefTRIAXone (ROCEPHIN)  IV  1 g Intravenous Q24H  . citalopram  40 mg Oral Daily  . enoxaparin (LOVENOX) injection  40 mg Subcutaneous Q24H  . furosemide  60 mg Oral Daily  . ipratropium  0.5 mg Nebulization Q4H  . levalbuterol  0.63 mg Nebulization Q4H  . pantoprazole  40 mg Oral Daily  . predniSONE  40 mg Oral Q breakfast   Continuous Infusions:  PRN Meds:.acetaminophen **OR** acetaminophen, guaiFENesin-codeine, levalbuterol, lidocaine, traMADol Assessment/Plan: Active Problems:   Chronic anxiety   HTN (hypertension)   GERD (gastroesophageal reflux disease)   Barrett's esophagus   Chronic bronchitis with acute exacerbation   Osteoporosis  COPD exacerbation: Patient presenting with increased frequency of cough, white sputum production and  dyspnea, b/l wheezing on exam and CXR w/o acute abnormalities, afebrile without leukocytosis. CXR suggests COPD but no PFTs in chart. This morning she says that she feels improved but not yet to her baseline. Still wheezy on exam. She qualifies for antibiotics as she has increased dyspnea and sputum but is not at risk for pseudomonas. -cont prednisone 40 mg po daily -levalbuterol neb q4h sch and q2hprn (causes less jitters than albuterol) -ipratropium neb q4h sch -pulmicort neb bid -albuterol neb q2hprn -ceftriaxone 1 g iv daily -guaidenesin-codeine 100-10 mL q4hprn -O2  supplementation as needed >92%  Migraines: She says she does not take the prescribed home propanolol 40 mg bid, which acts on B2 receptors and not good during respiratory compromise. -tramadol 50 mg q6hprn -acetaminophen 650 po or pr q6hprn  Hypertension: BP this am 109/60 -Continue home amlodipine 10 mg daily.  GERD: Patient status post Nissen fundoplication. -Continue protonix 40 mg daily.  Depression:  -Continue home Celexa  Osteoporosis: -Continue home Fosamax, calcium carbonate, cholecalciferol.  Diet: Heart DVT ppx: lovenox 40 mg Code: Full  Dispo: Disposition is deferred at this time, awaiting improvement of current medical problems.  Anticipated discharge in approximately 1-2 day(s).   The patient does have a current PCP Sherri Manges(Alex M Wilson, DO) and [facility] after discharge.  The patient does not know have transportation limitations that hinder transportation to clinic appointments.  .Services Needed at time of discharge: Y = Yes, Blank = No PT:   OT:   RN:   Equipment:   Other:       Lorenda HatchetAdam L , MD 09/03/2014, 11:21 AM

## 2014-09-03 NOTE — H&P (Signed)
Date: 09/03/2014               Patient Name:  Sherri Mcmillan MRN: [id number]  DOB: Jun 16, 1932 Age / Sex: 81 y.o., female   PCP: [name], DO         Medical Service: Internal Medicine Teaching Service         Attending Physician: Dr. [name], MD    First Contact: Dr. Valentino Saxon Pager: [phone]  Second Contact: Dr. Yetta Barre Pager: [phone]       After Hours (After 5p/  First Contact Pager: [phone]  weekends / holidays): Second Contact Pager: [phone]   Chief Complaint: Cough and URI  History of Present Illness:  Patient is a 81 year old with history of hypertension, GERD, aortic stenosis who presents to the emergency department for dyspnea. She states that her symptoms first started around 6 days ago. She states that she's been having increased cough, white sputum production, and increased dyspnea over this time period. She states that her home inhalers have not helped to adequately alleviate her symptoms. She denies any fevers but states that she has chills at baseline. She denies any sick contacts. She denies any leg pain or swelling. She states that she does have some chest and neck pain associated with coughing. Otherwise, she denies nausea, vomiting, abdominal pain, dysuria, hematuria, constipation, or diarrhea. She states that she is a former smoker. She denies any alcohol or illicit drug use.  In the emergency department, patient was given prednisone 60 mg by mouth, albuterol nebulizer, magnesium, 1 L normal saline bolus, azithromycin 500 mg. Of note, patient refused dexamethasone injection. Patient states that she is sensitive to prednisone and that it makes her gain weight.  No flowsheet data found.   Meds:  (Not in a hospital admission) Current Facility-Administered Medications  Medication Dose Route Frequency Provider Last Rate Last Dose  . albuterol (PROVENTIL,VENTOLIN) solution continuous neb  10 mg/hr Nebulization Continuous Tomasita Crumble, MD   Stopped at 09/03/14  0227  . albuterol (PROVENTIL,VENTOLIN) solution continuous neb  10 mg/hr Nebulization Continuous Tomasita Crumble, MD   Stopped at 09/03/14 0526  . dexamethasone (DECADRON) injection 10 mg  10 mg Intramuscular Once Tomasita Crumble, MD   10 mg at 09/03/14 0225   Current Outpatient Prescriptions  Medication Sig Dispense Refill  . amLODipine (NORVASC) 10 MG tablet TAKE 1 TABLET (10 MG TOTAL) BY MOUTH DAILY WITH BREAKFAST. 30 tablet 3  . esomeprazole (NEXIUM) 20 MG capsule Take 20 mg by mouth daily at 12 noon.    Marland Kitchen azithromycin (ZITHROMAX) 250 MG tablet Take 1 tablet (250 mg total) by mouth daily. 4 tablet 0  . predniSONE (DELTASONE) 20 MG tablet Take 3 tablets (60 mg total) by mouth daily. 12 tablet 0  . [DISCONTINUED] albuterol (PROVENTIL HFA;VENTOLIN HFA) 108 (90 BASE) MCG/ACT inhaler Inhale 1-2 puffs into the lungs every 6 (six) hours as needed for wheezing or shortness of breath. (Patient not taking: Reported on 09/02/2014) 1 Inhaler 0  . [DISCONTINUED] Calcium Carb-Cholecalciferol 600-800 MG-UNIT TABS Take 600-800 mg by mouth 2 (two) times daily. (Patient not taking: Reported on 09/02/2014) 60 tablet 2  . [DISCONTINUED] propranolol (INDERAL) 40 MG tablet Take 1 tablet (40 mg total) by mouth 2 (two) times daily. (Patient not taking: Reported on 09/02/2014) 60 tablet 1    Past Medical History  Diagnosis Date  . Chronic pain     DJD knees, back, migranes, LLE varicose veins, and LLE neuropathy.   . Aortic stenosis  Dx ECHO 2008 , mild and aymptomatic , needs ECHO q 3-5 yrs  . Barrett's esophagus     Demonstrated on EGD 12/2010. EGD 09/17/2012 shows inflamed GE junctional mucosa without metaplasia, dysplasia, or malignancy.  . Chronic insomnia   . Chronic anxiety     [facility]  for 6 weeks after mother died. Complicated again by death of her sister 2010. 2012 developed hallucinations and I refused to refill controlled meds unless she see psych which she is not agreable to  .  Elevated cholesterol 10/11    LDL 155. Her 10 year risk (decreasing her age to [age] as the calculator won't go to age 89) is 21% ish. If consider her non smoker (which I wouldn't even though she says 1 pak lasts 2 weeks) risk is 12% ish. So goal for LDL is 130 or 100.  Marland Kitchen. HTN (hypertension)     Controlled with 2 drug therapy  . Cataract   . Depression   . Gastroparesis     Demonstrated on GES 12/2010 by Dr Dalene SeltzerKapland  . Bronchitis     hx of  . Left leg DVT 09/28/2012  . Anemia 12/14/2012  . GERD (gastroesophageal reflux disease)   . H/O hiatal hernia   . Migraine     sometimes  . DVT (deep venous thrombosis)     Past Surgical History  Procedure Laterality Date  . Direct laryngoscopy   September 2008     preoperative diagnosis hoarseness with anterior right vocal cord lesion -  direct laryngoscopy and excisional biopsy of right anterior vocal cord lesion done by Dr. Ezzard StandingNewman  . Meniscectomy   July 2002     preoperative diagnosis torn medial meniscus right knee, partial medial meniscectomy, debridement chondroplasty patellofemoral joint, done by Dr. Madelon Lipsaffrey  . Abdominal hysterectomy    . Hemorrhoid surgery    . Appendectomy    . Cataract extraction      left eye  . Polypectomy  2000    Dr.Magod  . Colonoscopy  2000&2005    Dr.Magod  . Esophagogastroduodenoscopy  11/2010  . Knee arthroscopy    . Eye surgery    . Rotator cuff repair  09/21/2011    rt shoulder  . Esophagogastroduodenoscopy N/A 09/17/2012    Procedure: ESOPHAGOGASTRODUODENOSCOPY (EGD);  Surgeon: Hart Carwinora M Brodie, MD;  [facility];  Service: Endoscopy;  Laterality: N/A;  . Esophagogastroduodenoscopy N/A 12/17/2012    Procedure: ESOPHAGOGASTRODUODENOSCOPY (EGD);  Surgeon: Beverley FiedlerJay M Pyrtle, MD;  [facility];  Service: Gastroenterology;  Laterality: N/A;  . Laparoscopic nissen fundoplication N/A 05/02/2013    Procedure: LAPAROSCOPIC NISSEN FUNDOPLICATION;  Surgeon: Valarie MerinoMatthew B Martin, MD;  Location: [facility] ORS;  Service:  General;  Laterality: N/A;  . Endovenous ablation saphenous vein w/ laser Left 05-09-2014    EVLA left small saphenous vein by Gretta Beganodd Early MD     Allergies: Allergies as of 09/02/2014 - Review Complete 09/02/2014  Allergen Reaction Noted  . Aspirin Rash 07/06/2006    Family History  Problem Relation Age of Onset  . Stroke Neg Hx   . Cancer Neg Hx   . Colon cancer Neg Hx   . Anesthesia problems Neg Hx   . Hypotension Neg Hx   . Malignant hyperthermia Neg Hx   . Pseudochol deficiency Neg Hx   . Heart disease Mother   . Hypertension Mother   . Heart attack Mother   . Hypertension Father   . Heart attack Father   .  Diabetes Brother     History   Social History  . Marital Status: Single    Spouse Name: N/A  . Number of Children: N/A  . Years of Education: N/A   Occupational History  . Not on file.   Social History Main Topics  . Smoking status: Former Smoker -- 50 years    Types: Cigarettes    Quit date: 06/01/2010  . Smokeless tobacco: Never Used  . Alcohol Use: No     Comment: quit 24 years ago  . Drug Use: No  . Sexual Activity: Not on file   Other Topics Concern  . 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   Physical Exam: Blood pressure 139/77, pulse 106, temperature 98.1 F (36.7 C), temperature source Rectal, resp. rate 24, height 5' (1.524 m), weight 49.896 kg (110 lb), last menstrual period 04/26/1950, SpO2 99 %. General: resting in bed, shaky and jittery on breathing treatment HEENT: PERRL, EOMI, no scleral icterus Cardiac: Tachycardic, regular rhythm, 3/6 systolic  murmur, no rubs or gallops Pulm: Inspiratory wheezes throughout bilateral lung fields Abd: soft, nontender, nondistended, BS present Ext: warm and well perfused, no pedal edema Neuro: alert and oriented X3, cranial nerves II-XII grossly intact Skin: no rashes or lesions noted Psych: appropriate affect   Lab results: Basic Metabolic Panel:  Recent Labs  24/40/1004/01/10 1922  NA 139  K 3.9  CL 106  CO2 25  GLUCOSE 96  BUN 13  CREATININE 0.83  CALCIUM 9.2   Liver Function Tests: No results for input(s): AST, ALT, ALKPHOS, BILITOT, PROT, ALBUMIN in the last 72 hours. No results for input(s): LIPASE, AMYLASE in the last 72 hours. No results for input(s): AMMONIA in the last 72 hours. CBC:  Recent Labs  09/02/14 1922  WBC 4.0  HGB 10.6*  HCT 32.8*  MCV 85.0  PLT 144*   Cardiac Enzymes: No results for input(s): CKTOTAL, CKMB, CKMBINDEX, TROPONINI in the last 72 hours. BNP: No results for input(s): PROBNP in the last 72 hours. D-Dimer: No results for input(s): DDIMER in the last 72 hours. CBG: No results for input(s): GLUCAP in the last 72 hours. Hemoglobin A1C: No results for input(s): HGBA1C in the last 72 hours. Fasting Lipid Panel: No results for input(s): CHOL, HDL, LDLCALC, TRIG, CHOLHDL, LDLDIRECT in the last 72 hours. Thyroid Function Tests: No results for input(s): TSH, T4TOTAL, FREET4, T3FREE, THYROIDAB in the last 72 hours. Anemia Panel: No results for input(s): VITAMINB12, FOLATE, FERRITIN, TIBC, IRON, RETICCTPCT in the last 72 hours. Coagulation: No results for input(s): LABPROT, INR in the last 72 hours. Urine Drug Screen: Drugs of Abuse  No results found for: LABOPIA, COCAINSCRNUR, LABBENZ, AMPHETMU, THCU, LABBARB  Alcohol Level: No results for input(s): ETH in the last 72 hours. Urinalysis: No results for input(s): COLORURINE, LABSPEC, PHURINE, GLUCOSEU, HGBUR, BILIRUBINUR, KETONESUR, PROTEINUR, UROBILINOGEN, NITRITE, LEUKOCYTESUR in the last 72  hours.  Invalid input(s): APPERANCEUR  Imaging results:  Dg Chest 2 View (if Patient Has Fever And/or Copd)  09/02/2014  CLINICAL DATA:  Chest pain, cough and shortness of breath for the past 4 days. Ex-smoker.  EXAM: CHEST  2 VIEW  COMPARISON:  02/02/2014 and chest CTA dated 02/02/2014.  FINDINGS: Normal sized heart. Clear lungs. The lungs remain mildly hyperexpanded with minimal diffuse peribronchial thickening and accentuation of the interstitial markings. Small to moderate-sized hiatal hernia. Unremarkable bones.  IMPRESSION: 1. No acute abnormality. 2. Stable mild changes of COPD and chronic bronchitis. 3. Small to moderate-sized hiatal hernia.   Electronically Signed   By: Beckie Salts M.D.   On: 09/02/2014 20:24   Assessment & Plan by Problem: Active Problems:   Acute bronchitis, bacterial  Patient is a 81 year old with a history of hypertension, GERD, depression, osteoporosis, aortic stenosis who is admitted for a COPD exacerbation.   COPD exacerbation: Patient presenting with increased frequency of cough, white sputum production and dyspnea. Exam remarkable for bilateral wheezing. Chest x-ray with no acute abnormalities and only remarkable for stable mild changes of COPD. patient is afebrile with no leukocytosis. No PFTs upon my review of the chart. Patient currently doing well, satting 100% on room air. Review of the chart shows that patient has not been admitted for COPD exacerbation in the past. Wells score of 1.5 given previous history of DVT but no other concerning factors. Patient completed 3 month course of anticoagulation therapy on Coumadin in 2014 for this. -Continue prednisone -Continue azithromycin -DuoNeb nebulizer  Migraines: -Continue home propanolol.  -Acetaminophen  Hypertension: Patient relatively normotensive upon initial evaluation. -Continue home amlodipine 10 mg daily.  GERD: Patient status post Nissen fundoplication. -Continue home 20 mg  daily.  Depression:  -Continue home Celexa  Osteoporosis: -Continue home Fosamax, calcium carbonate, cholecalciferol.  Diet: Heart DVT ppx: lovenox 40 mg Code: Full  Dispo: Disposition is deferred at this time, awaiting improvement of current medical problems. Anticipated discharge in approximately 0-1 day(s).   The patient does have a current PCP [name], DO) and does need an [facility] hospital follow-up appointment after discharge.  The patient does not have transportation limitations that hinder transportation to clinic appointments.  Signed: Harold Barban, M.D., Ph.D. Internal Medicine Teaching Service, PGY-1 09/03/2014, 5:54 AM

## 2014-09-03 NOTE — Progress Notes (Signed)
Pt arrived to 4N21 via stretcher from ED. Oriented to room and equipment. Vitals stable. Pt currently at rest sleeping. No family at bedside. Will continue to monitor.

## 2014-09-03 NOTE — ED Notes (Signed)
Pt refused prednisone x 2; reports taking it in the past and "It makes me gain weight. I'm sorry but I'm not taking it." Informed pt for need of steroids; refusing medication. MD aware

## 2014-09-03 NOTE — ED Notes (Signed)
Pt c/o increased shortness of breath with wheezing x 3 days. Reports having an inhaler over a year ago, stating inhaler is not good now.  Denies fever, has been feeling cold at home. Also c/o headache, with hx of migraines. Lungs sounds diminished on assessment; neb treatment in progress.

## 2014-09-04 DIAGNOSIS — I350 Nonrheumatic aortic (valve) stenosis: Secondary | ICD-10-CM | POA: Diagnosis present

## 2014-09-04 DIAGNOSIS — Z79899 Other long term (current) drug therapy: Secondary | ICD-10-CM | POA: Diagnosis not present

## 2014-09-04 DIAGNOSIS — I10 Essential (primary) hypertension: Secondary | ICD-10-CM | POA: Diagnosis present

## 2014-09-04 DIAGNOSIS — F419 Anxiety disorder, unspecified: Secondary | ICD-10-CM | POA: Diagnosis present

## 2014-09-04 DIAGNOSIS — Z87891 Personal history of nicotine dependence: Secondary | ICD-10-CM | POA: Diagnosis not present

## 2014-09-04 DIAGNOSIS — K2270 Barrett's esophagus without dysplasia: Secondary | ICD-10-CM | POA: Diagnosis present

## 2014-09-04 DIAGNOSIS — G43909 Migraine, unspecified, not intractable, without status migrainosus: Secondary | ICD-10-CM | POA: Diagnosis present

## 2014-09-04 DIAGNOSIS — E785 Hyperlipidemia, unspecified: Secondary | ICD-10-CM | POA: Diagnosis present

## 2014-09-04 DIAGNOSIS — J440 Chronic obstructive pulmonary disease with acute lower respiratory infection: Secondary | ICD-10-CM | POA: Diagnosis present

## 2014-09-04 DIAGNOSIS — M810 Age-related osteoporosis without current pathological fracture: Secondary | ICD-10-CM | POA: Diagnosis present

## 2014-09-04 DIAGNOSIS — K219 Gastro-esophageal reflux disease without esophagitis: Secondary | ICD-10-CM | POA: Diagnosis present

## 2014-09-04 DIAGNOSIS — Z7952 Long term (current) use of systemic steroids: Secondary | ICD-10-CM | POA: Diagnosis not present

## 2014-09-04 DIAGNOSIS — Z90710 Acquired absence of both cervix and uterus: Secondary | ICD-10-CM | POA: Diagnosis not present

## 2014-09-04 DIAGNOSIS — J441 Chronic obstructive pulmonary disease with (acute) exacerbation: Secondary | ICD-10-CM | POA: Diagnosis present

## 2014-09-04 DIAGNOSIS — Z886 Allergy status to analgesic agent status: Secondary | ICD-10-CM | POA: Diagnosis not present

## 2014-09-04 DIAGNOSIS — F329 Major depressive disorder, single episode, unspecified: Secondary | ICD-10-CM | POA: Diagnosis present

## 2014-09-04 LAB — CBC
HEMATOCRIT: 30.8 % — AB (ref 36.0–46.0)
Hemoglobin: 9.9 g/dL — ABNORMAL LOW (ref 12.0–15.0)
MCH: 26.8 pg (ref 26.0–34.0)
MCHC: 32.1 g/dL (ref 30.0–36.0)
MCV: 83.5 fL (ref 78.0–100.0)
Platelets: 163 10*3/uL (ref 150–400)
RBC: 3.69 MIL/uL — AB (ref 3.87–5.11)
RDW: 15 % (ref 11.5–15.5)
WBC: 4.4 10*3/uL (ref 4.0–10.5)

## 2014-09-04 MED ORDER — LEVOFLOXACIN IN D5W 500 MG/100ML IV SOLN
500.0000 mg | Freq: Once | INTRAVENOUS | Status: AC
  Administered 2014-09-04: 500 mg via INTRAVENOUS
  Filled 2014-09-04: qty 100

## 2014-09-04 MED ORDER — METHYLPREDNISOLONE SODIUM SUCC 40 MG IJ SOLR
40.0000 mg | Freq: Three times a day (TID) | INTRAMUSCULAR | Status: DC
  Administered 2014-09-04 – 2014-09-05 (×3): 40 mg via INTRAVENOUS
  Filled 2014-09-04 (×3): qty 1

## 2014-09-04 MED ORDER — LEVOFLOXACIN IN D5W 250 MG/50ML IV SOLN
250.0000 mg | INTRAVENOUS | Status: DC
  Filled 2014-09-04: qty 50

## 2014-09-04 MED ORDER — OXYCODONE-ACETAMINOPHEN 5-325 MG PO TABS
1.0000 | ORAL_TABLET | Freq: Once | ORAL | Status: AC
  Administered 2014-09-04: 1 via ORAL
  Filled 2014-09-04: qty 1

## 2014-09-04 MED ORDER — METHYLPREDNISOLONE SODIUM SUCC 125 MG IJ SOLR
125.0000 mg | Freq: Once | INTRAMUSCULAR | Status: AC
  Administered 2014-09-04: 125 mg via INTRAVENOUS
  Filled 2014-09-04: qty 2

## 2014-09-04 NOTE — Progress Notes (Signed)
ANTIBIOTIC CONSULT NOTE  Pharmacy Consult for levaquin Indication: COPD exacerbation  Allergies  Allergen Reactions  . Aspirin Rash    Patient Measurements: Height: 5' (152.4 cm) Weight: 121 lb 0.5 oz (54.9 kg) IBW/kg (Calculated) : 45.5 Adjusted Body Weight:   Vital Signs: Temp: 98.4 F (36.9 C) (05/11 0911) Temp Source: Oral (05/11 0911) BP: 152/84 mmHg (05/11 0911) Pulse Rate: 61 (05/11 0911) Intake/Output from previous day: 05/10 0701 - 05/11 0700 In: 360 [P.O.:360] Out: -  Intake/Output from this shift:    Labs:  Recent Labs  09/02/14 1922 09/04/14 0425  WBC 4.0 4.4  HGB 10.6* 9.9*  PLT 144* 163  CREATININE 0.83  --    Estimated Creatinine Clearance: 42.8 mL/min (by C-G formula based on Cr of 0.83). No results for input(s): VANCOTROUGH, VANCOPEAK, VANCORANDOM, GENTTROUGH, GENTPEAK, GENTRANDOM, TOBRATROUGH, TOBRAPEAK, TOBRARND, AMIKACINPEAK, AMIKACINTROU, AMIKACIN in the last 72 hours.   Microbiology: No results found for this or any previous visit (from the past 720 hour(s)).  Medical History: Past Medical History  Diagnosis Date  . Chronic pain     DJD knees, back, migranes, LLE varicose veins, and LLE neuropathy.   . Aortic stenosis     Dx ECHO 2008 , mild and aymptomatic , needs ECHO q 3-5 yrs  . Barrett's esophagus     Demonstrated on EGD 12/2010. EGD 09/17/2012 shows inflamed GE junctional mucosa without metaplasia, dysplasia, or malignancy.  . Chronic insomnia   . Chronic anxiety     [facility]  for [facility] weeks after mother died. Complicated again by death of her sister 2010. 2012 developed hallucinations and I refused to refill controlled meds unless she see psych which she is not agreable to  . Elevated cholesterol 10/11    LDL 155. Her 10 year risk (decreasing her age to [age] as the calculator won't go to age 81) is 21% ish. If consider her non smoker (which I wouldn't even though she says 1 pak lasts 2 weeks) risk is 12% ish.  So goal for LDL is 130 or 100.  Marland Kitchen. HTN (hypertension)     Controlled with 2 drug therapy  . Cataract   . Depression   . Gastroparesis     Demonstrated on GES 12/2010 by Dr Dalene SeltzerKapland  . Bronchitis     hx of  . Left leg DVT 09/28/2012  . Anemia 12/14/2012  . GERD (gastroesophageal reflux disease)   . H/O hiatal hernia   . Migraine     sometimes  . DVT (deep venous thrombosis)     Medications:  Anti-infectives    Start     Dose/Rate Route Frequency Ordered Stop   09/05/14 1200  Levofloxacin (LEVAQUIN) IVPB 250 mg     250 mg 50 mL/hr over 60 Minutes Intravenous Every 24 hours 09/04/14 1139 09/08/14 2359   09/04/14 1230  levofloxacin (LEVAQUIN) IVPB 500 mg     500 mg 100 mL/hr over 60 Minutes Intravenous  Once 09/04/14 1139     09/03/14 1000  cefTRIAXone (ROCEPHIN) 1 g in dextrose 5 % 50 mL IVPB - Premix  Status:  Discontinued     1 g 100 mL/hr over 30 Minutes Intravenous Every 24 hours 09/03/14 0918 09/04/14 1122   09/03/14 0215  azithromycin (ZITHROMAX) tablet 500 mg     500 mg Oral  Once 09/03/14 0209 09/03/14 0226   09/03/14 0000  azithromycin (ZITHROMAX) 250 MG tablet     250 mg Oral Daily 09/03/14  0209       Assessment: 81 yo female admitted with SOB, wheezing, cough. Pt has not improved after two days of Rocephin.  Will transition the patient to levaquin.  5/10 azithro x1 5/10 CTX > 5/11  Goal of Therapy:  Resolution of infection  Plan:  Levaquin 500 mg IV x1, then 250 q24h x 5 days Transition the pt to PO levaquin when appropriate  Pharmacy will sign off, please re-consult as needed.   Agapito GamesAlison , PharmD, BCPS Clinical Pharmacist Pager: [phone] 09/04/2014 11:40 AM

## 2014-09-04 NOTE — Progress Notes (Signed)
Subjective: No acute events overnight. Sherri Mcmillan still does not feel well. She says her SOB is improved from when she was admitted but still feels SOB, wheezy, and has a cough. She is now agreeable to iv solumedrol  Objective: Vital signs in last 24 hours: Filed Vitals:   09/03/14 2217 09/04/14 0249 09/04/14 0629 09/04/14 0911  BP: 129/64 136/71 130/75 152/84  Pulse: 59 80 55 61  Temp: 98.4 F (36.9 C) 98.2 F (36.8 C) 98 F (36.7 C) 98.4 F (36.9 C)  TempSrc: Oral Oral Oral Oral  Resp: 16 18 16 16   Height:      Weight:      SpO2: 100% 92% 100% 93%   Weight change:  No intake or output data in the 24 hours ending 09/04/14 1132 Gen: A&O x 4, No acute distress, well developed, well nourished HEENT: Atraumatic, PERRL, EOMI, sclerae anicteric, moist mucous membranes Heart: Regular rate and rhythm, normal S1 S2, 3/6 systolic ejection murmur Lungs: scattered wheezes b/l, respirations unlabored Abd: Soft, non-tender, non-distended, + bowel sounds, no hepatosplenomegaly Ext: No edema or cyanosis  Lab Results: Basic Metabolic Panel:  Recent Labs Lab 09/02/14 1922  NA 139  K 3.9  CL 106  CO2 25  GLUCOSE 96  BUN 13  CREATININE 0.83  CALCIUM 9.2   CBC:  Recent Labs Lab 09/02/14 1922 09/04/14 0425  WBC 4.0 4.4  HGB 10.6* 9.9*  HCT 32.8* 30.8*  MCV 85.0 83.5  PLT 144* 163    Micro Results: No results found for this or any previous visit (from the past 240 hour(s)). Studies/Results: Dg Chest 2 View (if Patient Has Fever And/or Copd)  09/02/2014   CLINICAL DATA:  Chest pain, cough and shortness of breath for the past 4 days. Ex-smoker.  EXAM: CHEST  2 VIEW  COMPARISON:  02/02/2014 and chest CTA dated 02/02/2014.  FINDINGS: Normal sized heart. Clear lungs. The lungs remain mildly hyperexpanded with minimal diffuse peribronchial thickening and accentuation of the interstitial markings. Small to moderate-sized hiatal hernia. Unremarkable bones.  IMPRESSION: 1. No acute  abnormality. 2. Stable mild changes of COPD and chronic bronchitis. 3. Small to moderate-sized hiatal hernia.   Electronically Signed   By: Beckie SaltsSteven  Reid M.D.   On: 09/02/2014 20:24   Medications: I have reviewed the patient's current medications. Scheduled Meds: . amLODipine  10 mg Oral Daily  . budesonide (PULMICORT) nebulizer solution  0.25 mg Nebulization BID  . calcium-vitamin D  1 tablet Oral BID  . capsicum  1 application Topical TID  . citalopram  40 mg Oral Daily  . enoxaparin (LOVENOX) injection  40 mg Subcutaneous Q24H  . furosemide  60 mg Oral Daily  . ipratropium  0.5 mg Nebulization TID  . levalbuterol  0.63 mg Nebulization TID  . methylPREDNISolone (SOLU-MEDROL) injection  125 mg Intravenous Once  . pantoprazole  40 mg Oral Daily  . predniSONE  40 mg Oral Q breakfast   Continuous Infusions:  PRN Meds:.acetaminophen **OR** acetaminophen, guaiFENesin-codeine, levalbuterol, lidocaine, traMADol Assessment/Plan: Active Problems:   Chronic anxiety   HTN (hypertension)   GERD (gastroesophageal reflux disease)   Barrett's esophagus   Chronic bronchitis with acute exacerbation   Osteoporosis  COPD exacerbation: Patient presented with increased frequency of cough, white sputum production and dyspnea, b/l wheezing on exam and CXR w/o acute abnormalities, afebrile without leukocytosis. CXR suggests COPD but no PFTs in chart. She has not improved much clinically despite iv ceftriaxone, scheduled duonebs, prednisone. She is still wheezy  and symptomatically does not feel well.  -iv solumedrol 125 mg iv once -levalbuterol neb q4h sch and q2hprn (causes less jitters than albuterol) -ipratropium neb q4h sch -pulmicort neb bid -albuterol neb q2hprn -d/c ceftriaxone 1 g iv daily, begin levaquin per pharmacy (QTc 419) -guaidenesin-codeine 100-10 mL q4hprn -O2 supplementation as needed >92%  Migraines: She says she does not take the prescribed home propanolol 40 mg bid, which acts on B2  receptors and not good during respiratory compromise. -tramadol 50 mg q6hprn -acetaminophen 650 po or pr q6hprn  Hypertension: BP this am 152/84 -Continue home amlodipine 10 mg daily.  GERD: Patient status post Nissen fundoplication. -Continue protonix 40 mg daily.  Depression:  -Continue home Celexa  Osteoporosis: -Continue home Fosamax, calcium carbonate, cholecalciferol.  Diet: Heart DVT ppx: lovenox 40 mg Code: Full  Dispo: Disposition is deferred at this time, awaiting improvement of current medical problems.  Anticipated discharge in approximately 1-2 day(s).   The patient does have a current PCP [name](Alex M Wilson, DO) and [facility] after [facility].  The patient does not know have transportation limitations that hinder transportation to clinic appointments.  .Services Needed at time of discharge: Y = Yes, Blank = No PT:   OT:   RN:   Equipment:   Other:       Lorenda HatchetAdam L , MD 09/04/2014, 11:32 AM

## 2014-09-04 NOTE — Progress Notes (Signed)
CM CONSULT - COPD Talked to patient about DCP; patient lives alone, remains active, stated that she walks 5-6 blocks a day, dances in a church group " they call me the dipping lady." Patient stated that her neighbor checks on her frequently; [facility], no problems getting her medication- pharmacy of choice [facility] on [address] Does not use home 02 at this time; Patient could benefit from a Disease Management Program for COPD for medication teaching; Alexis GoodellB  RN,BSN,MHA [phone]

## 2014-09-04 NOTE — Progress Notes (Signed)
  PROGRESS NOTE MEDICINE TEACHING ATTENDING   Day  of stay Patient name: Sherri Mcmillan   Medical record number: [id number] Date of birth: Jun 25, 1932   Does not feel good. Blood pressure 152/84, pulse 61, temperature 98.4 F (36.9 C), temperature source Oral, resp. rate 16, height 5' (1.524 m), weight 121 lb 0.5 oz (54.9 kg), last menstrual period 04/26/1950, SpO2 93 %. The patient is alert and oriented, audibly wheezing and coughing, not desaturating on nasal canula but does not look comfortable. PERRL, EOMI. Heart exhibits regular rate and rhythm, no murmurs. Lungs have loud wheezes and rhonchi all lung fields. Abdomen is soft and non-tender. There is no pedal edema and good pedal pulses. There are no gross focal neurological deficits apparent.   Assessment/Plan COPD exacerbation - Received one dose of solumedrol 125 mg today. We will add solumedrol to 40mg  Q8 standing dose if the patient is still wheezing. Start Levaquin 750 mg IV daily. I have discussed the care of this patient with my IM team residents. Please see the resident note for details.  Aletta EdouardBHARDWAJ,  09/04/2014, 12:18 PM.

## 2014-09-05 LAB — CBC
HCT: 34.5 % — ABNORMAL LOW (ref 36.0–46.0)
Hemoglobin: 11.2 g/dL — ABNORMAL LOW (ref 12.0–15.0)
MCH: 27 pg (ref 26.0–34.0)
MCHC: 32.5 g/dL (ref 30.0–36.0)
MCV: 83.1 fL (ref 78.0–100.0)
Platelets: 189 10*3/uL (ref 150–400)
RBC: 4.15 MIL/uL (ref 3.87–5.11)
RDW: 14.3 % (ref 11.5–15.5)
WBC: 3.3 10*3/uL — ABNORMAL LOW (ref 4.0–10.5)

## 2014-09-05 LAB — BASIC METABOLIC PANEL
ANION GAP: 10 (ref 5–15)
BUN: 19 mg/dL (ref 6–20)
CO2: 27 mmol/L (ref 22–32)
Calcium: 9.4 mg/dL (ref 8.9–10.3)
Chloride: 101 mmol/L (ref 101–111)
Creatinine, Ser: 0.95 mg/dL (ref 0.44–1.00)
GFR calc non Af Amer: 55 mL/min — ABNORMAL LOW (ref >60)
Glucose, Bld: 117 mg/dL — ABNORMAL HIGH (ref 65–99)
POTASSIUM: 3.6 mmol/L (ref 3.5–5.1)
SODIUM: 138 mmol/L (ref 135–145)

## 2014-09-05 IMAGING — CR DG UGI W/ GASTROGRAFIN
7 series · 15 of 24 positions shown · IV contrast (omnipaque)
Comparison: 02/21/2013

FLUOROSCOPY TIME:  26 seconds

CLINICAL DATA: Status post fundoplication wrap

EXAM:
WATER SOLUBLE UPPER GI SERIES
TECHNIQUE: Single-column upper GI series was performed using water soluble
contrast.
CONTRAST:  50mL OMNIPAQUE IOHEXOL 300 MG/ML  SOLN

[run (1 of 6)]
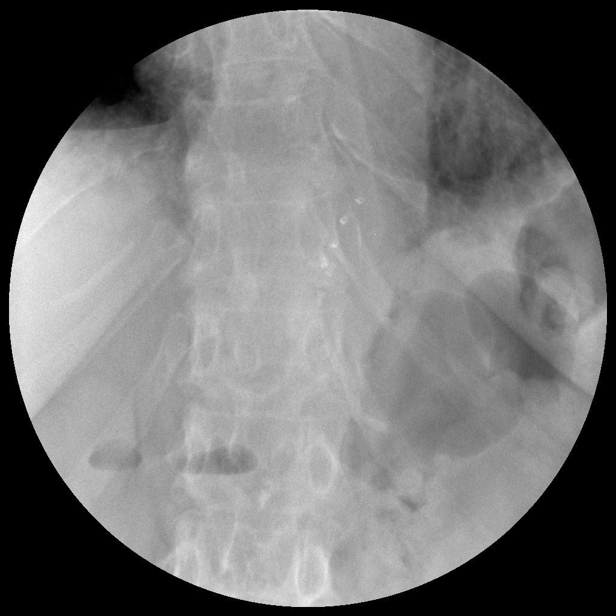

[Series 2: run · 4 of 36 slices shown (2 of 6)]
[im 6/36]
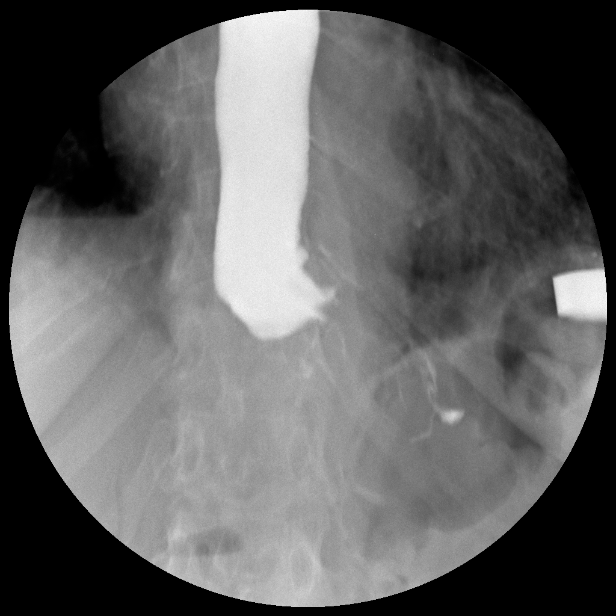
[im 18/36]
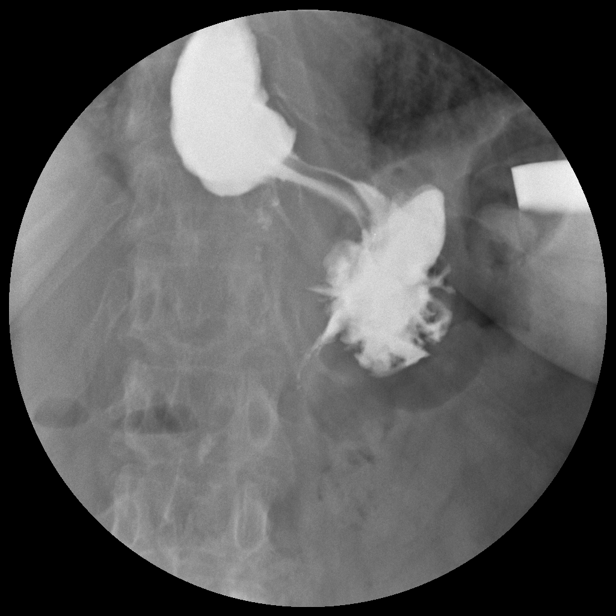
[im 24/36]
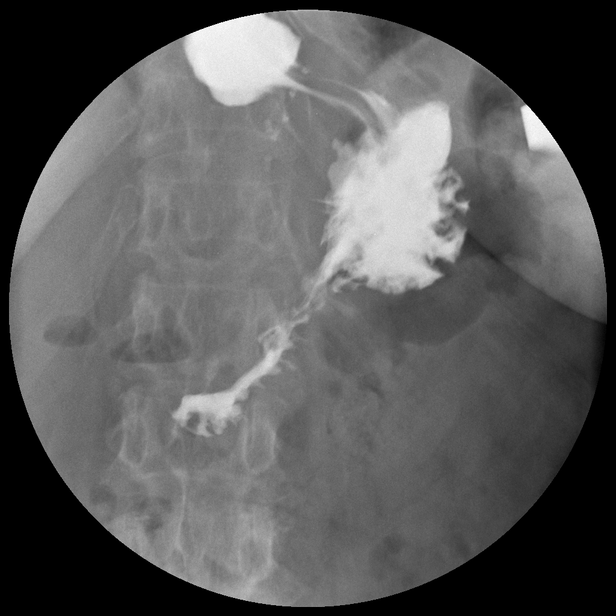
[im 36/36]
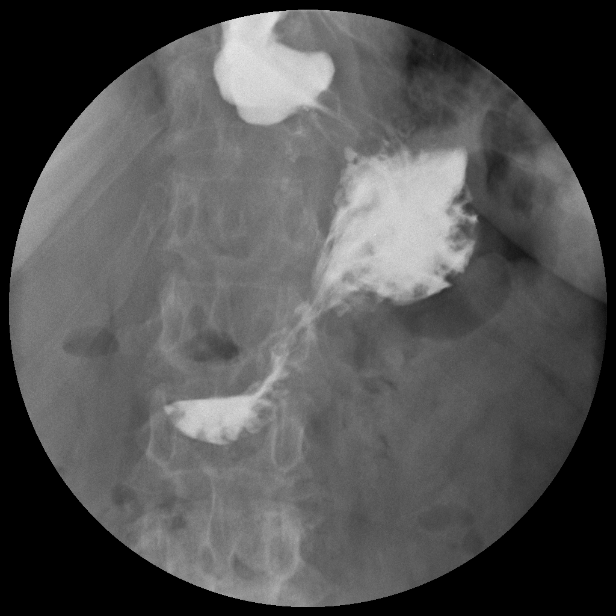

[run (3 of 6)]
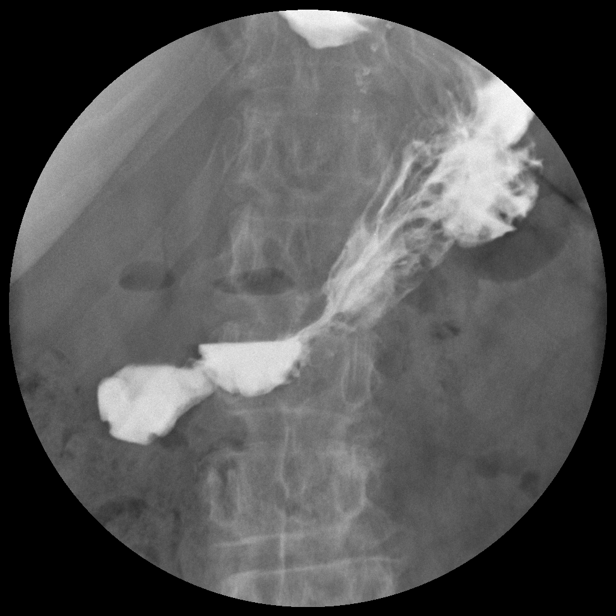

[Series 4: run · 2 of 15 slices shown (4 of 6)]
[im 1/15]
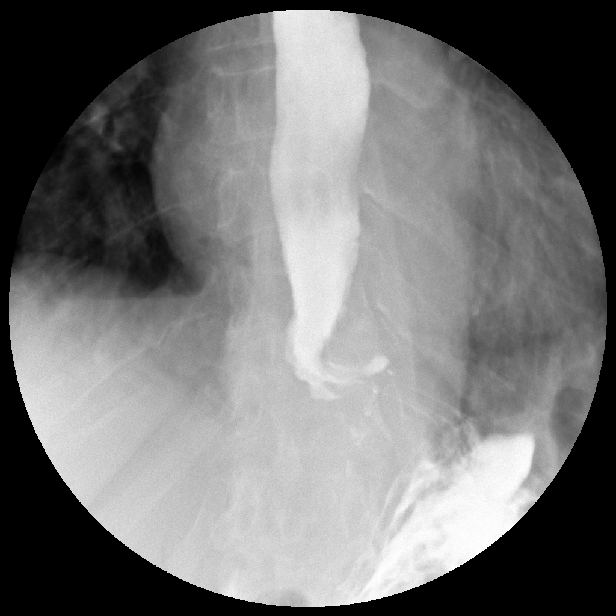
[im 15/15]
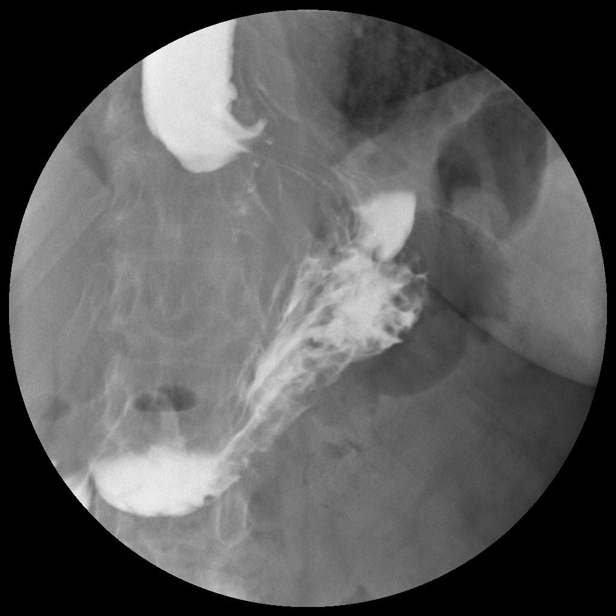

[Series 5: run · 4 of 28 slices shown (5 of 6)]
[im 1/28]
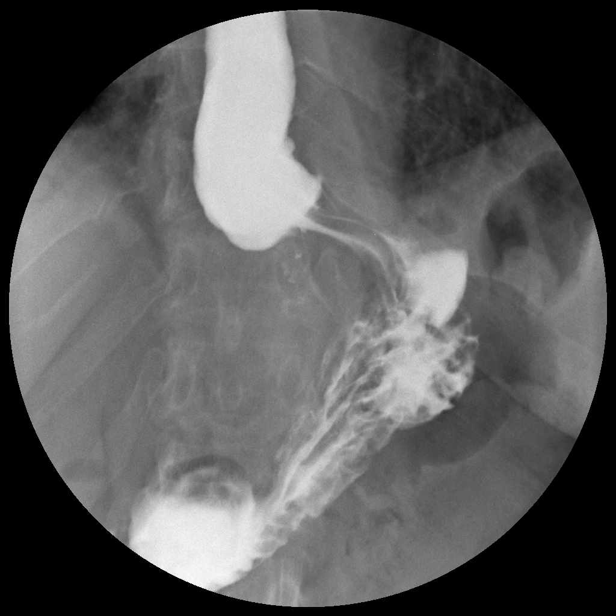
[im 11/28]
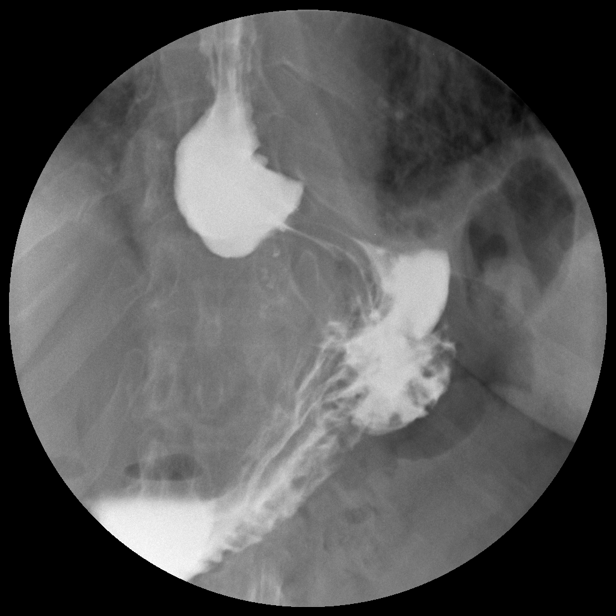
[im 17/28]
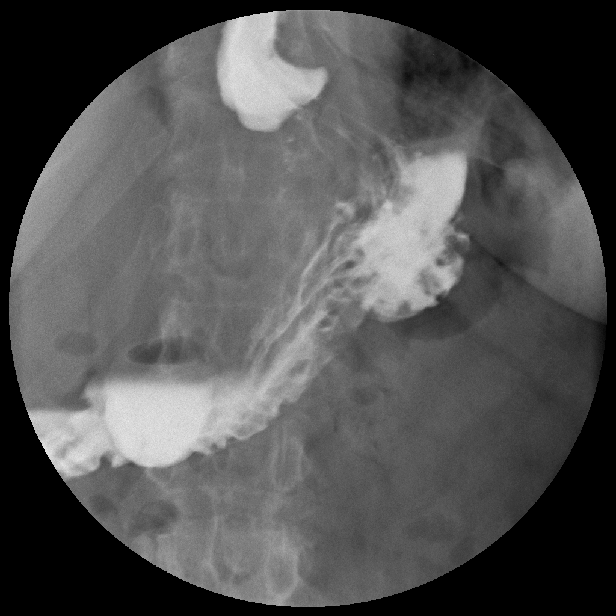
[im 28/28]
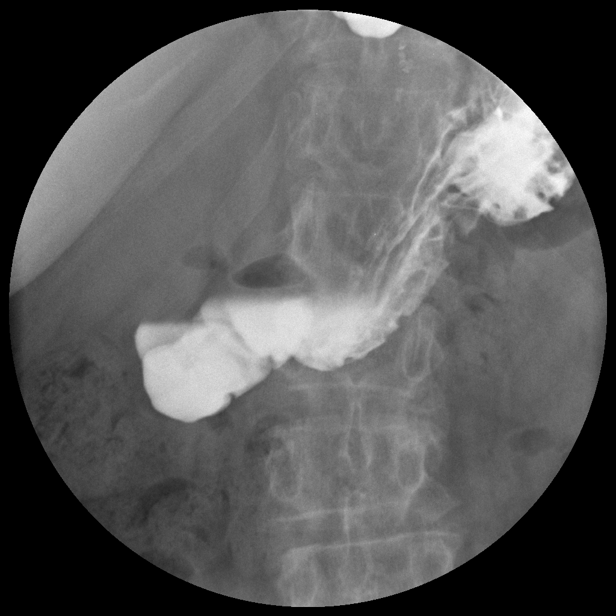

[Series 6: run · 2 of 18 slices shown (6 of 6)]
[im 6/18]
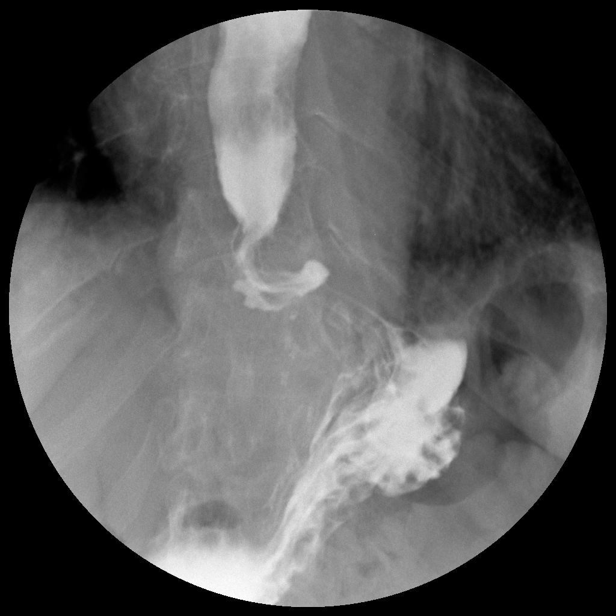
[im 12/18]
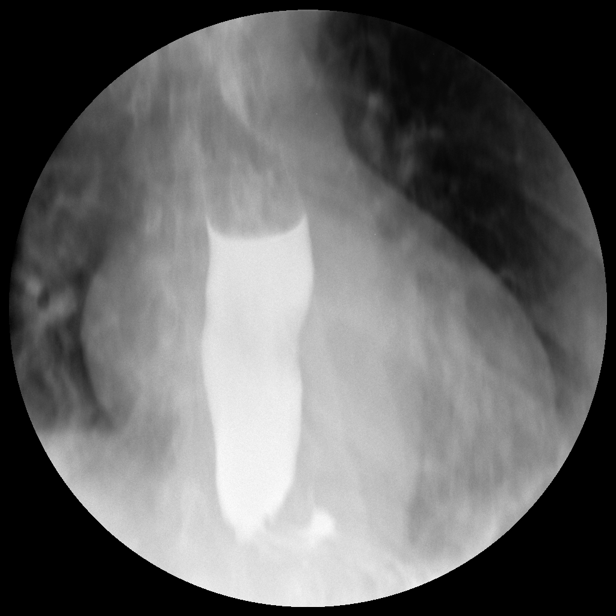

[view not recorded]
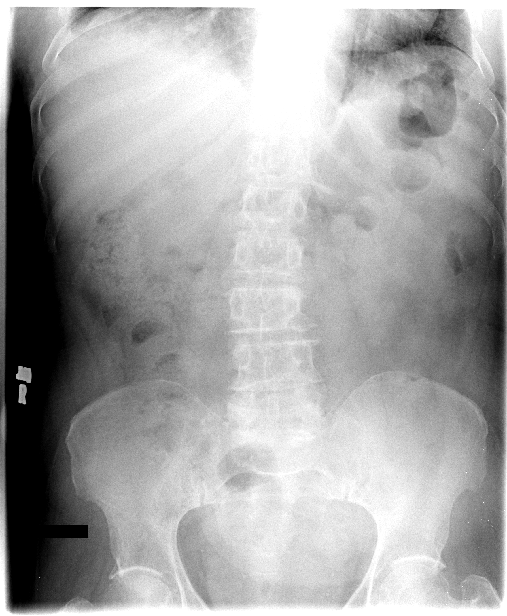

[15 of 24 positions shown; findings below may reference images not displayed]

FINDINGS: The patient ingested 50 cc of Omni 300 water-soluble contrast
material. Postoperative change from fundoplication wrap noted. The
wrapped segment of the proximal stomach appears patent. There is no
extravasation of contrast material.
IMPRESSION: 1. No complicating features status post fundoplication wrap. The
fundoplication wrap appears intact and there is no extravasation of
contrast material.

## 2014-09-05 MED ORDER — PREDNISONE 50 MG PO TABS
50.0000 mg | ORAL_TABLET | Freq: Every day | ORAL | Status: DC
  Administered 2014-09-06 – 2014-09-07 (×2): 50 mg via ORAL
  Filled 2014-09-05 (×2): qty 1

## 2014-09-05 MED ORDER — SENNA 8.6 MG PO TABS
1.0000 | ORAL_TABLET | Freq: Two times a day (BID) | ORAL | Status: DC
  Administered 2014-09-05 – 2014-09-07 (×4): 8.6 mg via ORAL
  Filled 2014-09-05 (×4): qty 1

## 2014-09-05 MED ORDER — METHYLPREDNISOLONE SODIUM SUCC 40 MG IJ SOLR
40.0000 mg | Freq: Two times a day (BID) | INTRAMUSCULAR | Status: AC
  Administered 2014-09-05: 40 mg via INTRAVENOUS
  Filled 2014-09-05: qty 1

## 2014-09-05 MED ORDER — METHYLPREDNISOLONE SODIUM SUCC 40 MG IJ SOLR: 40.0000 mg | Freq: Two times a day (BID) | INTRAMUSCULAR | Status: DC

## 2014-09-05 MED ORDER — LEVOFLOXACIN 250 MG PO TABS
250.0000 mg | ORAL_TABLET | Freq: Every day | ORAL | Status: DC
  Administered 2014-09-05 – 2014-09-07 (×3): 250 mg via ORAL
  Filled 2014-09-05 (×3): qty 1

## 2014-09-05 NOTE — Progress Notes (Signed)
oob in hallway with no complication. Completed 140 feet independently, some dyspnea present. o2 sat remained above 93% on room air throughout walk. Maintained 94 bpm per monitor. Back to room, up in chair, on room air, dyspnea relieved.

## 2014-09-05 NOTE — Progress Notes (Signed)
  PROGRESS NOTE MEDICINE TEACHING ATTENDING   Day 1 of stay Patient name: Sherri Mcmillan   Medical record number: [id number] Date of birth: 1933/03/06   Patient reports improvement in breathing and cough.   Blood pressure 154/94, pulse 77, temperature 97.7 F (36.5 C), temperature source Oral, resp. rate 20, height 5' (1.524 m), weight 118 lb 13.3 oz (53.9 kg), last menstrual period 04/26/1950, SpO2 96 %. The patient is alert and oriented, comfortable, in no acute distress. PERRL, EOMI. Heart exhibits regular rate and rhythm, no murmurs. Lungs have mild expiratory wheezing, with rhonchi (much better than yesterday). Abdomen is soft and non-tender. There is no pedal edema and good pedal pulses. There are no gross focal neurological deficits apparent.   Assessment/Plan COPD exacerbation - agree switching levaquin to PO, we continue solumedrol Q12 until tomorrow. I have discussed the care of this patient with my IM team residents. Please see the resident note for details. Aletta EdouardBHARDWAJ,  09/05/2014, 12:28 PM.

## 2014-09-05 NOTE — Progress Notes (Signed)
Subjective: No acute events overnight. Yesterday [name] agreed to iv steroids and antibiotics escalated to levaquin. She says that she feels much improved this morning but still has some. She also says cough is improved but still has some.   Objective: Vital signs in last 24 hours: Filed Vitals:   09/04/14 2150 09/05/14 0219 09/05/14 0500 09/05/14 0555  BP: 116/65 147/76  140/68  Pulse: 69 63  63  Temp: 98.7 F (37.1 C) 98.2 F (36.8 C)  98.4 F (36.9 C)  TempSrc: Oral Oral  Oral  Resp: 18 18  18   Height:      Weight:   118 lb 13.3 oz (53.9 kg)   SpO2: 98% 99%  96%   Weight change:  No intake or output data in the 24 hours ending 09/05/14 0851 Gen: A&O x 4, No acute distress, well developed, well nourished HEENT: Atraumatic, PERRL, EOMI, sclerae anicteric, moist mucous membranes Heart: Regular rate and rhythm, normal S1 S2, 3/6 systolic ejection murmur Lungs: faint scattered wheezes and rhonchi b/l, respirations unlabored Abd: Soft, non-tender, non-distended, + bowel sounds, no hepatosplenomegaly Ext: No edema or cyanosis  Lab Results: Basic Metabolic Panel:  Recent Labs Lab 09/02/14 1922 09/05/14 0423  NA 139 138  K 3.9 3.6  CL 106 101  CO2 25 27  GLUCOSE 96 117*  BUN 13 19  CREATININE 0.83 0.95  CALCIUM 9.2 9.4   CBC:  Recent Labs Lab 09/04/14 0425 09/05/14 0423  WBC 4.4 3.3*  HGB 9.9* 11.2*  HCT 30.8* 34.5*  MCV 83.5 83.1  PLT 163 189    Micro Results: No results found for this or any previous visit (from the past 240 hour(s)). Studies/Results: No results found. Medications: I have reviewed the patient's current medications. Scheduled Meds: . amLODipine  10 mg Oral Daily  . budesonide (PULMICORT) nebulizer solution  0.25 mg Nebulization BID  . calcium-vitamin D  1 tablet Oral BID  . capsicum  1 application Topical TID  . citalopram  40 mg Oral Daily  . enoxaparin (LOVENOX) injection  40 mg Subcutaneous Q24H  . furosemide  60 mg Oral Daily   . ipratropium  0.5 mg Nebulization TID  . levalbuterol  0.63 mg Nebulization TID  . levofloxacin  250 mg Oral Daily  . methylPREDNISolone (SOLU-MEDROL) injection  40 mg Intravenous Q12H  . pantoprazole  40 mg Oral Daily   Continuous Infusions:  PRN Meds:.acetaminophen **OR** acetaminophen, guaiFENesin-codeine, levalbuterol, lidocaine, traMADol Assessment/Plan: Active Problems:   Chronic anxiety   HTN (hypertension)   GERD (gastroesophageal reflux disease)   Barrett's esophagus   Chronic bronchitis with acute exacerbation   Osteoporosis   COPD exacerbation  COPD exacerbation: Patient presented with increased frequency of cough, white sputum production and dyspnea, b/l wheezing on exam and CXR w/o acute abnormalities, afebrile without leukocytosis. CXR suggests COPD but no PFTs in chart. She had not improved much clinically despite iv ceftriaxone, scheduled duonebs, prednisone. Yesterday she was put on iv levaquin, received solumedrol 125 mg iv once and then solumedrol 40 mg iv q8h. This morning she is symptomatically improved as well as on lung auscultation. She is sating well on room air. -solumedrol 40 mg iv q12h -levalbuterol neb q4h sch and q2hprn (causes less jitters than albuterol) -ipratropium neb q4h sch -pulmicort neb bid -levaquin 250 mg po -guaidenesin-codeine 100-10 mL q4hprn -O2 supplementation as needed >92%  Migraines: She says she does not take the prescribed home propanolol 40 mg bid, which acts on B2 receptors  and not good during respiratory compromise. -tramadol 50 mg q6hprn -acetaminophen 650 po or pr q6hprn  Hypertension: BP this am 140/68 -Continue home amlodipine 10 mg daily.  GERD: Patient status post Nissen fundoplication. -Continue protonix 40 mg daily.  Depression:  -Continue home Celexa  Osteoporosis: -Continue home Fosamax, calcium carbonate, cholecalciferol.  Diet: Heart DVT ppx: lovenox 40 mg Code: Full  Dispo: Disposition is deferred at  this time, awaiting improvement of current medical problems.  Anticipated discharge in approximately 1 day(s).   The patient does have a current PCP [name](Alex M Wilson, DO) and [facility] after discharge.  The patient does not know have transportation limitations that hinder transportation to clinic appointments.  .Services Needed at time of discharge: Y = Yes, Blank = No PT:   OT:   RN:   Equipment:   Other:     LOS: 1 day   Lorenda HatchetAdam L , MD 09/05/2014, 8:51 AM

## 2014-09-06 NOTE — Discharge Summary (Signed)
Name: Sherri Mcmillan MRN: [id number] DOB: 1932/09/27 81 y.o. PCP: [name], DO  Date of Admission: 09/02/2014 11:29 PM Date of Discharge: 09/07/2014 Attending Physician: [name], MD  Discharge Diagnosis:  Active Problems:   Chronic anxiety   HTN (hypertension)   GERD (gastroesophageal reflux disease)   Barrett's esophagus   Chronic bronchitis with acute exacerbation   Osteoporosis   COPD exacerbation  Discharge Medications:   Medication List    STOP taking these medications        acetaminophen-codeine 300-30 MG per tablet  Commonly known as:  TYLENOL #3     benzonatate 100 MG capsule  Commonly known as:  TESSALON     Calcium Carb-Cholecalciferol 600-800 MG-UNIT Tabs  Replaced by:  calcium-vitamin D 500-200 MG-UNIT per tablet     Capsaicin 0.1 % Crea     furosemide 40 MG tablet  Commonly known as:  LASIX     ibuprofen 800 MG tablet  Commonly known as:  ADVIL,MOTRIN     lidocaine 2 % solution  Commonly known as:  XYLOCAINE     propranolol 40 MG tablet  Commonly known as:  INDERAL     traMADol 50 MG tablet  Commonly known as:  ULTRAM      TAKE these medications        albuterol 108 (90 BASE) MCG/ACT inhaler  Commonly known as:  PROVENTIL HFA;VENTOLIN HFA  Inhale 2 puffs into the lungs every 2 (two) hours as needed for wheezing or shortness of breath (cough).     alendronate 70 MG tablet  Commonly known as:  FOSAMAX  Take 1 tablet (70 mg total) by mouth once a week. Take with a full glass of water on an empty stomach.     amLODipine 10 MG tablet  Commonly known as:  NORVASC  TAKE 1 TABLET (10 MG TOTAL) BY MOUTH DAILY WITH BREAKFAST.     calcium-vitamin D 500-200 MG-UNIT per tablet  Commonly known as:  OSCAL WITH D  Take 1 tablet by mouth 2 (two) times daily.     citalopram 40 MG tablet  Commonly known as:  CELEXA  Take 1 tablet (40 mg total) by mouth daily.     esomeprazole 20 MG capsule  Commonly known as:  NEXIUM  Take 20 mg by  mouth daily at 12 noon.     guaiFENesin-codeine 100-10 MG/5ML syrup  Take 5 mLs by mouth every 4 (four) hours as needed for cough.     levofloxacin 250 MG tablet  Commonly known as:  LEVAQUIN  Take 1 tablet (250 mg total) by mouth daily. On Sunday 5/15     predniSONE 50 MG tablet  Commonly known as:  DELTASONE  Take once a day on 5/15, 5/16, and 5/17        Disposition and follow-up:   [name] was discharged from [facility] in Stable condition.  At the hospital follow up visit please address:  1.  Respiratory status  2.  Labs / imaging needed at time of follow-up: PFTs, consider pulmonology referral  3.  Pending labs/ test needing follow-up: none  Follow-up Appointments: Follow-up Information    Follow up with [name], MD On 09/12/2014.   Specialty:  Internal Medicine   Why:  10:15 AM   Contact information:   [address] [place] [address] [phone]       Procedures Performed:  Dg Chest 2 View (if Patient Has Fever And/or Copd)  09/02/2014  CLINICAL DATA:  Chest pain, cough and shortness of breath for the past 4 days. Ex-smoker.  EXAM: CHEST  2 VIEW  COMPARISON:  02/02/2014 and chest CTA dated 02/02/2014.  FINDINGS: Normal sized heart. Clear lungs. The lungs remain mildly hyperexpanded with minimal diffuse peribronchial thickening and accentuation of the interstitial markings. Small to moderate-sized hiatal hernia. Unremarkable bones.  IMPRESSION: 1. No acute abnormality. 2. Stable mild changes of COPD and chronic bronchitis. 3. Small to moderate-sized hiatal hernia.   Electronically Signed   By: Beckie SaltsSteven  Reid M.D.   On: 09/02/2014 20:24   Admission HPI: Patient is a 81 year old with history of hypertension, GERD, aortic stenosis who presents to the emergency department for dyspnea. She states that her symptoms first started around 6 days ago. She states that she's been having increased cough, white sputum production, and increased  dyspnea over this time period. She states that her home inhalers have not helped to adequately alleviate her symptoms. She denies any fevers but states that she has chills at baseline. She denies any sick contacts. She denies any leg pain or swelling. She states that she does have some chest and neck pain associated with coughing. Otherwise, she denies nausea, vomiting, abdominal pain, dysuria, hematuria, constipation, or diarrhea. She states that she is a former smoker. She denies any alcohol or illicit drug use.  In the emergency department, patient was given prednisone 60 mg by mouth, albuterol nebulizer, magnesium, 1 L normal saline bolus, azithromycin 500 mg. Of note, patient refused dexamethasone injection. Patient states that she is sensitive to prednisone and that it makes her gain weight.  Hospital Course by problem list: Active Problems:   Chronic anxiety   HTN (hypertension)   GERD (gastroesophageal reflux disease)   Barrett's esophagus   Chronic bronchitis with acute exacerbation   Osteoporosis   COPD exacerbation   COPD Exacerbation: [name] presented with increased frequency of cough, white sputum production and dyspnea, b/l wheezing on exam, afebrile without leukocytosis. In the emergency department, patient was given prednisone 60 mg by mouth (refused iv solumedrol), albuterol nebulizer, magnesium, 1 L normal saline bolus, azithromycin 500 mg. Chest X-ray on admission showed no acute abnormalities but was remarkable for stable mild changes of COPD and chronic bronchitis despite no prior history of documented COPD or PFTs. Patient initially did not improve despite q4 scheduled levalbuterol/ipratropium, IV ceftriaxone and prednisone. Antibiotics were then escalated to levaquin (500 mg once, then 250 mg daily through 5/15) and she then agreed to iv steroids. She received solumedrol 125 mg IV once on 5/11 and then solumedrol 40 mg IV q8h x 1 d and then q12h x 1 d. She had much symptomatic  improvement but she still has some persistent cough and faint wheezing with upper airway rhonchi on exam. She is sating well on room air including no oxygenation requirement with ambulation. She will require out-patient PFTs and possible referral to pulmonology. She is to complete 5 day course of prednisone 50 mg daily (through 5/17) and remaining antibiotic course of levaquin 250 mg daily through 5/15. She was also discharged with albuterol inhaler.   Migraines: She says she does not take the prescribed home propanolol 40 mg bid, which acts on B2 receptors and not good during respiratory compromise. Patient was placed on tramadol 50 mg q6h prn and acetaminophen 650 po or pr q6hprn while hospitalized.  Hypertension: Patient was continued on home amlodipine 10 mg daily. Her BP was generally well controlled.   GERD: Patient status post  Nissen fundoplication. Patient was continued on home protonix 40 mg daily.  Depression: Patient was continued on home Celexa.  Osteoporosis: Patient was continued on home Fosamax, calcium carbonate and cholecalciferol.  Discharge Vitals:   BP 145/74 mmHg  Pulse 64  Temp(Src) 98.1 F (36.7 C) (Oral)  Resp 18  Ht 5' (1.524 m)  Wt 118 lb 9.7 oz (53.8 kg)  BMI 23.16 kg/m2  SpO2 97%  LMP 04/26/1950  Discharge Physical Exam: Gen: A&O x 4, No acute distress, well developed, well nourished HEENT: Atraumatic, PERRL, EOMI, sclerae anicteric, moist mucous membranes Heart: Regular rate and rhythm, normal S1 S2, 3/6 systolic ejection murmur Lungs: faint scattered wheezes and rhonchi b/l (improved), respirations unlabored Abd: Soft, non-tender, non-distended, + bowel sounds, no hepatosplenomegaly Ext: No edema or cyanosis  Discharge Labs:  Basic Metabolic Panel:  Recent Labs Lab 09/02/14 1922 09/05/14 0423  NA 139 138  K 3.9 3.6  CL 106 101  CO2 25 27  GLUCOSE 96 117*  BUN 13 19  CREATININE 0.83 0.95  CALCIUM 9.2 9.4   CBC:  Recent Labs Lab  09/04/14 0425 09/05/14 0423  WBC 4.4 3.3*  HGB 9.9* 11.2*  HCT 30.8* 34.5*  MCV 83.5 83.1  PLT 163 189    Signed: Lorenda HatchetAdam L , MD 09/07/2014, 10:01 AM    Services Ordered on Discharge: none Equipment Ordered on Discharge: none

## 2014-09-06 NOTE — Progress Notes (Addendum)
  PROGRESS NOTE MEDICINE TEACHING ATTENDING   Day 2 of stay Patient name: Sherri ShieldsMattie Mcmillan   Medical record number: [id number] Date of birth: 1932-07-01   Patient feels better, although cough still worse. Ambulated without destauration.  Blood pressure 135/73, pulse 62, temperature 98.3 F (36.8 C), temperature source Oral, resp. rate 20, height 5' (1.524 m), weight 118 lb 2.7 oz (53.6 kg), last menstrual period 04/26/1950, SpO2 96 %. The patient is alert and oriented, comfortable, in no acute distress. PERRL, EOMI. Heart exhibits regular rate and rhythm, no murmurs. Lungs wheezy and rhonchorus. Abdomen is soft and non-tender. There is no pedal edema and good pedal pulses. There are no gross focal neurological deficits apparent.   Assessment/Plan COPD exacerbation - Although slowly clinically improving, and not desaturating on room air, the patient is still extremely wheezy and debilitated with extreme cough. When she was admitted, she did not do well with Po steoids. We had to start IV steroids on her, and she improved. Today, as we transition her back to Po steroids, I would like to keep her overnight to assure that she will hopefully not get worse. We will continue current treatment.    I have discussed the care of this patient with my IM team residents. Please see the resident note for details.  ,  09/06/2014, 11:59 AM.

## 2014-09-06 NOTE — Progress Notes (Signed)
Subjective: No acute events overnight. Yesterday she ambulated on room air with good oxygenation. She says she feels much improved overall but still has a cough and feels wheezy.  Objective: Vital signs in last 24 hours: Filed Vitals:   09/06/14 0500 09/06/14 0534 09/06/14 0828 09/06/14 0936  BP:  132/63  135/73  Pulse:  96 92 62  Temp:  98.2 F (36.8 C)  98.3 F (36.8 C)  TempSrc:  Oral  Oral  Resp:  18 18 20   Height:      Weight: 118 lb 2.7 oz (53.6 kg)     SpO2:  96% 95% 96%   Weight change: -10.6 oz (-0.3 kg) No intake or output data in the 24 hours ending 09/06/14 1055 Gen: A&O x 4, No acute distress, well developed, well nourished HEENT: Atraumatic, PERRL, EOMI, sclerae anicteric, moist mucous membranes Heart: Regular rate and rhythm, normal S1 S2, 3/6 systolic ejection murmur Lungs: faint scattered wheezes and rhonchi b/l unchanged, respirations unlabored Abd: Soft, non-tender, non-distended, + bowel sounds, no hepatosplenomegaly Ext: No edema or cyanosis  Lab Results: Basic Metabolic Panel:  Recent Labs Lab 09/02/14 1922 09/05/14 0423  NA 139 138  K 3.9 3.6  CL 106 101  CO2 25 27  GLUCOSE 96 117*  BUN 13 19  CREATININE 0.83 0.95  CALCIUM 9.2 9.4   CBC:  Recent Labs Lab 09/04/14 0425 09/05/14 0423  WBC 4.4 3.3*  HGB 9.9* 11.2*  HCT 30.8* 34.5*  MCV 83.5 83.1  PLT 163 189    Micro Results: No results found for this or any previous visit (from the past 240 hour(s)). Studies/Results: No results found. Medications: I have reviewed the patient's current medications. Scheduled Meds: . amLODipine  10 mg Oral Daily  . budesonide (PULMICORT) nebulizer solution  0.25 mg Nebulization BID  . calcium-vitamin D  1 tablet Oral BID  . capsicum  1 application Topical TID  . citalopram  40 mg Oral Daily  . enoxaparin (LOVENOX) injection  40 mg Subcutaneous Q24H  . furosemide  60 mg Oral Daily  . ipratropium  0.5 mg Nebulization TID  . levalbuterol  0.63  mg Nebulization TID  . levofloxacin  250 mg Oral Daily  . pantoprazole  40 mg Oral Daily  . predniSONE  50 mg Oral Q breakfast  . senna  1 tablet Oral BID   Continuous Infusions:  PRN Meds:.acetaminophen **OR** acetaminophen, guaiFENesin-codeine, levalbuterol, lidocaine, traMADol Assessment/Plan: Active Problems:   Chronic anxiety   HTN (hypertension)   GERD (gastroesophageal reflux disease)   Barrett's esophagus   Chronic bronchitis with acute exacerbation   Osteoporosis   COPD exacerbation  COPD exacerbation: Patient presented with increased frequency of cough, white sputum production and dyspnea, b/l wheezing on exam and CXR w/o acute abnormalities, afebrile without leukocytosis. CXR suggests COPD but no PFTs in chart. She had not improved much clinically despite iv ceftriaxone, scheduled duonebs, prednisone. This morning she continues to feel symptomatically improved, ambulated well yesterday on room air without oxygen requirement, but still has rhonchi that are likely upper airway and a cough.  -d/c solumedrol 40 mg iv q12h, begin prednisone 50 mg daily -levalbuterol neb q4h sch and q2hprn (causes less jitters than albuterol) -ipratropium neb q4h sch -pulmicort neb bid -levaquin 250 mg po daily -guaidenesin-codeine 100-10 mL q4hprn -O2 supplementation as needed >92% -outpatient PFT and possible referral to pulmonology  Migraines: She says she does not take the prescribed home propanolol 40 mg bid, which acts on B2  receptors and not good during respiratory compromise. -tramadol 50 mg q6hprn -acetaminophen 650 po or pr q6hprn  Hypertension: BP this am 135/73 -Continue home amlodipine 10 mg daily.  GERD: Patient status post Nissen fundoplication. -Continue protonix 40 mg daily.  Depression:  -Continue home Celexa  Osteoporosis: -Continue home Fosamax, calcium carbonate, cholecalciferol.  Diet: Heart DVT ppx: lovenox 40 mg Code: Full  Dispo: Disposition is deferred  at this time, awaiting improvement of current medical problems.  Anticipated discharge in approximately 1 day(s).   The patient does have a current PCP [name](Alex M Wilson, DO) and [facility] after discharge.  The patient does not know have transportation limitations that hinder transportation to clinic appointments.  .Services Needed at time of discharge: Y = Yes, Blank = No PT:   OT:   RN:   Equipment:   Other:     LOS: 2 days   Lorenda HatchetAdam L , MD 09/06/2014, 10:55 AM

## 2014-09-07 MED ORDER — ALENDRONATE SODIUM 70 MG PO TABS: 70.0000 mg | ORAL_TABLET | ORAL | Status: DC

## 2014-09-07 MED ORDER — LEVOFLOXACIN 250 MG PO TABS: 250.0000 mg | ORAL_TABLET | Freq: Every day | ORAL | Status: DC

## 2014-09-07 MED ORDER — GUAIFENESIN-CODEINE 100-10 MG/5ML PO SOLN: 5.0000 mL | ORAL | Status: DC | PRN

## 2014-09-07 MED ORDER — CALCIUM CARBONATE-VITAMIN D 500-200 MG-UNIT PO TABS: 1.0000 | ORAL_TABLET | Freq: Two times a day (BID) | ORAL | Status: DC

## 2014-09-07 MED ORDER — CITALOPRAM HYDROBROMIDE 40 MG PO TABS: 40.0000 mg | ORAL_TABLET | Freq: Every day | ORAL | Status: DC

## 2014-09-07 MED ORDER — ALBUTEROL SULFATE HFA 108 (90 BASE) MCG/ACT IN AERS: 2.0000 | INHALATION_SPRAY | RESPIRATORY_TRACT | Status: DC | PRN

## 2014-09-07 MED ORDER — PREDNISONE 50 MG PO TABS: ORAL_TABLET | ORAL | Status: DC

## 2014-09-07 NOTE — Discharge Instructions (Signed)
[facility]. You are to take the levofloxacin once tomorrow. You are to take the prednisone 50 mg once a day for three days, on Sunday 5/14, Monday 5/16 and Tuesday 5/17. You may use the albuterol inhaler every six hours as needed for breathing relief. Please return to the Emergency Department or seek medical attention if you have any new or worsening shortness of breath, fever, or other worrisome medical condition.   Farley LyAdam , MD

## 2014-09-07 NOTE — Progress Notes (Signed)
Subjective: No acute events overnight. She continues to feel improved and says that she feels even better than yesterday. She still has some residual SOB and cough but thinks it is much improved. She says she is agreeable with discharge today.  Objective: Vital signs in last 24 hours: Filed Vitals:   09/06/14 2153 09/07/14 0133 09/07/14 0500 09/07/14 0626  BP: 128/71 137/69  145/74  Pulse: 71 61  64  Temp: 98 F (36.7 C) 98.2 F (36.8 C)  98.1 F (36.7 C)  TempSrc: Oral Oral  Oral  Resp: 18 18  18   Height:      Weight:   118 lb 9.7 oz (53.8 kg)   SpO2: 96% 98%  97%   Weight change: 7.1 oz (0.2 kg) No intake or output data in the 24 hours ending 09/07/14 0751 Gen: A&O x 4, No acute distress, well developed, well nourished HEENT: Atraumatic, PERRL, EOMI, sclerae anicteric, moist mucous membranes Heart: Regular rate and rhythm, normal S1 S2, 3/6 systolic ejection murmur Lungs: faint scattered wheezes and rhonchi b/l (improved), respirations unlabored Abd: Soft, non-tender, non-distended, + bowel sounds, no hepatosplenomegaly Ext: No edema or cyanosis  Lab Results: Basic Metabolic Panel:  Recent Labs Lab 09/02/14 1922 09/05/14 0423  NA 139 138  K 3.9 3.6  CL 106 101  CO2 25 27  GLUCOSE 96 117*  BUN 13 19  CREATININE 0.83 0.95  CALCIUM 9.2 9.4   CBC:  Recent Labs Lab 09/04/14 0425 09/05/14 0423  WBC 4.4 3.3*  HGB 9.9* 11.2*  HCT 30.8* 34.5*  MCV 83.5 83.1  PLT 163 189    Micro Results: No results found for this or any previous visit (from the past 240 hour(s)). Studies/Results: No results found. Medications: I have reviewed the patient's current medications. Scheduled Meds: . amLODipine  10 mg Oral Daily  . budesonide (PULMICORT) nebulizer solution  0.25 mg Nebulization BID  . calcium-vitamin D  1 tablet Oral BID  . capsicum  1 application Topical TID  . citalopram  40 mg Oral Daily  . enoxaparin (LOVENOX) injection  40 mg Subcutaneous Q24H  .  furosemide  60 mg Oral Daily  . ipratropium  0.5 mg Nebulization TID  . levalbuterol  0.63 mg Nebulization TID  . levofloxacin  250 mg Oral Daily  . pantoprazole  40 mg Oral Daily  . predniSONE  50 mg Oral Q breakfast  . senna  1 tablet Oral BID   Continuous Infusions:  PRN Meds:.acetaminophen **OR** acetaminophen, guaiFENesin-codeine, levalbuterol, lidocaine, traMADol Assessment/Plan: Active Problems:   Chronic anxiety   HTN (hypertension)   GERD (gastroesophageal reflux disease)   Barrett's esophagus   Chronic bronchitis with acute exacerbation   Osteoporosis   COPD exacerbation  COPD exacerbation: Patient presented with increased frequency of cough, white sputum production and dyspnea, b/l wheezing on exam and CXR w/o acute abnormalities, afebrile without leukocytosis. CXR suggests COPD but no PFTs in chart. She had not improved much clinically despite iv ceftriaxone, scheduled duonebs, prednisone. This morning she feels much better, has ambulated well on room air without oxygen requirement, but still has rhonchi that are likely upper airway and a cough.  -cont prednisone 50 mg daily -levalbuterol neb q4h sch and q2hprn (causes less jitters than albuterol) -ipratropium neb q4h sch -pulmicort neb bid -levaquin 250 mg po daily -guaidenesin-codeine 100-10 mL q4hprn -O2 supplementation as needed >92% -outpatient PFT and possible referral to pulmonology  Migraines: She says she does not take the prescribed home propanolol  40 mg bid, which acts on B2 receptors and not good during respiratory compromise. -tramadol 50 mg q6hprn -acetaminophen 650 po or pr q6hprn  Hypertension: BP this am 145/74 -Continue home amlodipine 10 mg daily.  GERD: Patient status post Nissen fundoplication. -Continue protonix 40 mg daily.  Depression:  -Continue home Celexa  Osteoporosis: -Continue home Fosamax, calcium carbonate, cholecalciferol.  Diet: Heart DVT ppx: lovenox 40 mg Code:  Full  Dispo: Disposition is deferred at this time, awaiting improvement of current medical problems.  Anticipated discharge in approximately 0-1 day(s).   The patient does have a current PCP [name](Alex M Wilson, DO) and does need an [facility] hospital follow-up appointment after discharge.  The patient does not know have transportation limitations that hinder transportation to clinic appointments.  .Services Needed at time of discharge: Y = Yes, Blank = No PT:   OT:   RN:   Equipment:   Other:     LOS: 3 days   Lorenda HatchetAdam L , MD 09/07/2014, 7:51 AM

## 2014-09-07 NOTE — Progress Notes (Signed)
Patient is discharged from room 4N21 at this time. Alert and in stable condition. IV site d/c'd and instructions read to patient with understanding verbalized. Left unit via wheelchair with all belongings.

## 2014-09-11 ENCOUNTER — Telehealth: Payer: Self-pay | Admitting: Internal Medicine

## 2014-09-11 NOTE — Telephone Encounter (Signed)
Call to patient to confirm appointment for 09/12/14 at 10:15 memory on phone is full unable to leave message

## 2014-09-12 ENCOUNTER — Encounter: Payer: Self-pay | Admitting: Internal Medicine

## 2014-09-12 ENCOUNTER — Ambulatory Visit (INDEPENDENT_AMBULATORY_CARE_PROVIDER_SITE_OTHER): Payer: Medicare HMO | Admitting: Internal Medicine

## 2014-09-12 VITALS — BP 134/91 | HR 96 | Temp 98.8°F | Ht 60.0 in | Wt 115.4 lb

## 2014-09-12 DIAGNOSIS — J441 Chronic obstructive pulmonary disease with (acute) exacerbation: Secondary | ICD-10-CM | POA: Diagnosis not present

## 2014-09-12 DIAGNOSIS — I10 Essential (primary) hypertension: Secondary | ICD-10-CM

## 2014-09-12 MED ORDER — FUROSEMIDE 40 MG PO TABS: 60.0000 mg | ORAL_TABLET | Freq: Every day | ORAL | Status: DC

## 2014-09-12 NOTE — Assessment & Plan Note (Signed)
BP Readings from Last 3 Encounters:  09/12/14 134/91  09/07/14 143/82  05/21/14 136/79    Lab Results  Component Value Date   NA 138 09/05/2014   K 3.6 09/05/2014   CREATININE 0.95 09/05/2014    Assessment: Blood pressure control:  controlled Progress toward BP goal:   at goal Comments: lasix was stopped on discharge from hospital on 5/14 howerver pt has resumed lasix 60mg  daily. Also on norvasc 10mg  daily.   Plan: Medications:  continue current medications, added lasix 60mg  back to medication list.  Educational resources provided: handout Self management tools provided:   Other plans: recheck BP at 2 week f/u

## 2014-09-12 NOTE — Progress Notes (Signed)
   Subjective:    Patient ID: Sherri Mcmillan, female    DOB: 04-11-1933, 81 y.o.   MRN: [id number]  HPI Pt is a 81 y/o female w/ PMHx of COPD, GERD, HTN, and anxiety who presents for hospital f/u. She was admitted from 5/9-5/14 for a COPD exacerbation. She was discharged home with 3 day 50mg  prednisone course and and levofloxacin 250mg  x 1 day. She has completed both courses of medications. She states breathing and coughing have improved since discharge however are not back to baseline. She states coughing is causing her rib and abdominal pain as well as headaches. She is taking cheratussin for cough which has helped.   Also, during hospitalization lasix 60mg  was stopped as BP were well controlled on norvasc 10mg  alone. Since discharge pt has resumed lasix 60mg  daily which she states she needs for her leg swelling. Pt admanant about staying on lasix. Denies low blood pressures, states her BP get high when she gets agitated or anxious.    Review of Systems  HENT: Positive for congestion.   Respiratory: Positive for cough, shortness of breath (improving) and wheezing.   Cardiovascular:       Rib pain from coughing   Gastrointestinal: Positive for abdominal pain (from cough).  Musculoskeletal: Negative for gait problem.  Neurological: Positive for headaches.       Objective:   Physical Exam  Constitutional: She appears well-developed and well-nourished.  HENT:  Mouth/Throat: Oropharynx is clear and moist. No oropharyngeal exudate.  Cardiovascular: Normal rate and regular rhythm.   Pulmonary/Chest: Effort normal. No respiratory distress. She has wheezes. She has rales.  Skin: Skin is warm and dry.          Assessment & Plan:  Please see problem based assessment and plan.

## 2014-09-12 NOTE — Patient Instructions (Signed)
If you start to notice worsening in your breathing call clinic for an appointment. Thank you for bringing in all your medications with you!

## 2014-09-12 NOTE — Assessment & Plan Note (Signed)
Pt presents for hospital f/u for COPD exacerbation. She reports breathing and coughing have improved but they are not back at baseline. Pt finished course of steroids and abx. She has albuterol inhalers and knows how to use them. On exam pt had wheezing and coarse breath sounds.   - pt refusing to take additional prednisone course which I recommended due to findings on lung exam. She states that she does not like taking steroids b/c it increases her appetite.  - instructed to continue albuterol inhaler and to call clinic if breathing worsens at which time will prescribe prednisone taper - f/u in 1-2 weeks

## 2014-09-13 NOTE — Progress Notes (Signed)
Internal Medicine Clinic Attending  Case discussed with Dr. Truong at the time of the visit.  We reviewed the resident's history and exam and pertinent patient test results.  I agree with the assessment, diagnosis, and plan of care documented in the resident's note.  

## 2014-09-13 NOTE — Addendum Note (Signed)
Addended by: Debe CoderMULLEN,  B on: 09/13/2014 09:06 PM   Modules accepted: Level of Service

## 2014-10-08 ENCOUNTER — Telehealth: Payer: Self-pay | Admitting: Internal Medicine

## 2014-10-08 NOTE — Telephone Encounter (Signed)
Call to patient to confirm appointment for 6/15 at 3:15 mail box is full

## 2014-10-09 ENCOUNTER — Encounter: Payer: Self-pay | Admitting: Internal Medicine

## 2014-10-09 ENCOUNTER — Ambulatory Visit (INDEPENDENT_AMBULATORY_CARE_PROVIDER_SITE_OTHER): Payer: Medicare HMO | Admitting: Internal Medicine

## 2014-10-09 VITALS — BP 135/68 | HR 63 | Temp 98.2°F | Ht 60.0 in | Wt 118.6 lb

## 2014-10-09 DIAGNOSIS — K219 Gastro-esophageal reflux disease without esophagitis: Secondary | ICD-10-CM

## 2014-10-09 DIAGNOSIS — M810 Age-related osteoporosis without current pathological fracture: Secondary | ICD-10-CM

## 2014-10-09 DIAGNOSIS — F418 Other specified anxiety disorders: Secondary | ICD-10-CM

## 2014-10-09 DIAGNOSIS — K2270 Barrett's esophagus without dysplasia: Secondary | ICD-10-CM

## 2014-10-09 DIAGNOSIS — F329 Major depressive disorder, single episode, unspecified: Secondary | ICD-10-CM

## 2014-10-09 DIAGNOSIS — I10 Essential (primary) hypertension: Secondary | ICD-10-CM

## 2014-10-09 DIAGNOSIS — F419 Anxiety disorder, unspecified: Secondary | ICD-10-CM

## 2014-10-09 DIAGNOSIS — I872 Venous insufficiency (chronic) (peripheral): Secondary | ICD-10-CM

## 2014-10-09 DIAGNOSIS — I350 Nonrheumatic aortic (valve) stenosis: Secondary | ICD-10-CM

## 2014-10-09 MED ORDER — CITALOPRAM HYDROBROMIDE 20 MG PO TABS: 20.0000 mg | ORAL_TABLET | Freq: Every day | ORAL | Status: DC

## 2014-10-09 NOTE — Progress Notes (Signed)
Subjective:    Patient ID: Sherri Mcmillan, female    DOB: 09/07/1932, 81 y.o.   MRN: [id number]  HPI Comments: Sherri Mcmillan is an 81 year old with PMH as below here for follow-up of chronic conditions.  Please see problem based charting for A&P.  Anxiety Symptoms include nervous/anxious behavior. Patient reports no chest pain, nausea, palpitations or shortness of breath.       Past Medical History  Diagnosis Date  . Chronic pain     DJD knees, back, migranes, LLE varicose veins, and LLE neuropathy.   . Aortic stenosis     Dx ECHO 2008 , mild and aymptomatic , needs ECHO q 3-5 yrs  . Barrett's esophagus     Demonstrated on EGD 12/2010. EGD 09/17/2012 shows inflamed GE junctional mucosa without metaplasia, dysplasia, or malignancy.  . Chronic insomnia   . Chronic anxiety     Admission to [facility] (90s or early 2000)  for 6 weeks after mother died. Complicated again by death of her sister 2010. 2012 developed hallucinations and I refused to refill controlled meds unless she see psych which she is not agreable to  . Elevated cholesterol 10/11    LDL 155. Her 10 year risk (decreasing her age to 87 as the calculator won't go to age 22) is 21% ish. If consider her non smoker (which I wouldn't even though she says 1 pak lasts 2 weeks) risk is 12% ish. So goal for LDL is 130 or 100.  Marland Kitchen HTN (hypertension)     Controlled with 2 drug therapy  . Cataract   . Depression   . Gastroparesis     Demonstrated on GES 12/2010 by Dr Dalene Seltzer  . Bronchitis     hx of  . Left leg DVT 09/28/2012  . Anemia 12/14/2012  . GERD (gastroesophageal reflux disease)   . H/O hiatal hernia   . Migraine     sometimes  . DVT (deep venous thrombosis)    Current Outpatient Prescriptions on File Prior to Visit  Medication Sig Dispense Refill  . albuterol (PROVENTIL HFA;VENTOLIN HFA) 108 (90 BASE) MCG/ACT inhaler Inhale 2 puffs into the lungs every 2 (two) hours as needed for wheezing or shortness of breath (cough). 1 Inhaler  1  . amLODipine (NORVASC) 10 MG tablet TAKE 1 TABLET (10 MG TOTAL) BY MOUTH DAILY WITH BREAKFAST. 30 tablet 3  . esomeprazole (NEXIUM) 20 MG capsule Take 20 mg by mouth daily at 12 noon.    . furosemide (LASIX) 40 MG tablet Take 1.5 tablets (60 mg total) by mouth daily. 30 tablet 0  . alendronate (FOSAMAX) 70 MG tablet Take 1 tablet (70 mg total) by mouth once a week. Take with a full glass of water on an empty stomach. (Patient not taking: Reported on 10/09/2014) 4 tablet 0  . calcium-vitamin D (OSCAL WITH D) 500-200 MG-UNIT per tablet Take 1 tablet by mouth 2 (two) times daily. (Patient not taking: Reported on 10/09/2014) 30 tablet 0  . [DISCONTINUED] Calcium Carb-Cholecalciferol 600-800 MG-UNIT TABS Take 600-800 mg by mouth 2 (two) times daily. (Patient not taking: Reported on 09/02/2014) 60 tablet 2  . [DISCONTINUED] propranolol (INDERAL) 40 MG tablet Take 1 tablet (40 mg total) by mouth 2 (two) times daily. (Patient not taking: Reported on 09/02/2014) 60 tablet 1   No current facility-administered medications on file prior to visit.    Review of Systems  Constitutional: Negative for fever, chills, appetite change and unexpected weight change.  Appetite has been decreased for several months.  Respiratory: Positive for cough. Negative for shortness of breath.        Cough improving since discharge from hospital.  Cardiovascular: Positive for leg swelling. Negative for chest pain and palpitations.  Gastrointestinal: Negative for nausea, vomiting, abdominal pain, diarrhea, constipation and blood in stool.       + reflux; She denies dysphagia or odynophagia.  Genitourinary: Negative for dysuria, frequency and difficulty urinating.  Neurological: Positive for headaches. Negative for syncope and light-headedness.       Improvement in headaches  Psychiatric/Behavioral: The patient is nervous/anxious.        Filed Vitals:   10/09/14 1533  BP: 135/68  Pulse: 63  Temp: 98.2 F (36.8 C)    TempSrc: Oral  Height: 5' (1.524 m)  Weight: 118 lb 9.6 oz (53.797 kg)  SpO2: 98%   Objective:   Physical Exam  Constitutional: She is oriented to person, place, and time. She appears well-developed. No distress.  HENT:  Head: Normocephalic and atraumatic.  Mouth/Throat: Oropharynx is clear and moist. No oropharyngeal exudate.  Eyes: EOM are normal.  Neck: Neck supple.  Cardiovascular: Normal rate and regular rhythm.  Exam reveals no gallop and no friction rub.   Murmur heard. 2/6 systolic murmur, +S1 +S2  Pulmonary/Chest: Effort normal and breath sounds normal. No respiratory distress. She has no wheezes. She has no rales.  Abdominal: Soft. Bowel sounds are normal. She exhibits no distension and no mass. There is no tenderness. There is no rebound and no guarding.  Genitourinary: Vagina normal and uterus normal.  Musculoskeletal: Normal range of motion. She exhibits edema. She exhibits no tenderness.  Trace pretibial edema with venous stasis skin changes on left (unchanged from prior exam)   Neurological: She is alert and oriented to person, place, and time. No cranial nerve deficit.  Skin: Skin is warm. She is not diaphoretic.  Psychiatric: She has a normal mood and affect. Her behavior is normal.  Vitals reviewed.         Assessment & Plan:  Please see problem based charting for A&P.

## 2014-10-09 NOTE — Assessment & Plan Note (Addendum)
BP Readings from Last 3 Encounters:  10/09/14 135/68  09/12/14 134/91  09/07/14 143/82    Lab Results  Component Value Date   NA 138 09/05/2014   K 3.6 09/05/2014   CREATININE 0.95 09/05/2014    Assessment: Blood pressure control:  well controlled Progress toward BP goal:   at goal Comments: Compliant with medcations (she brought bottles).  Plan: Medications:  continue current medications:  Amlodipine 10mg  daily, DECREASE Lasix from 60mg  to 40mg  daily since she has less swelling and no other indication for Lasix.  Can start HCTZ if more BP control needed. Other plans: RTC in 1 month

## 2014-10-09 NOTE — Patient Instructions (Signed)
1. I have refilled your Celexa to help with your anxiety and depression.  I will also refer you for counseling.  Please come back to see me in 1-2 months or sooner if needed.     2. Please take all medications as prescribed.    3. If you have worsening of your symptoms or new symptoms arise, please call the clinic ([phone]), or go to the ER immediately if symptoms are severe.

## 2014-10-10 MED ORDER — AMLODIPINE BESYLATE 10 MG PO TABS: ORAL_TABLET | ORAL | Status: DC

## 2014-10-10 MED ORDER — ALENDRONATE SODIUM 70 MG PO TABS: 70.0000 mg | ORAL_TABLET | ORAL | Status: DC

## 2014-10-10 MED ORDER — FUROSEMIDE 40 MG PO TABS: 40.0000 mg | ORAL_TABLET | Freq: Every day | ORAL | Status: DC

## 2014-10-10 NOTE — Assessment & Plan Note (Addendum)
S/p left small saphenous ablation on 05/09/2014.  Leg looks better.  Trace edema, no ulcerations.  Can follow-up with vascular prn.  Continue elevation, compression prn.  Will start to decrease Lasix since it appears that it was only started for leg swelling. - decrease Lasix from 60mg  daily to 40mg  daily - continue compression, elevation

## 2014-10-10 NOTE — Assessment & Plan Note (Addendum)
Says she sometimes feels depressed.  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  She is pleasant and interactive during exam, not withdrawn.  She does get teary eyed when I ask her about feelings of guilt or worthlessness and does not answer the question.  She denies SI.  She has support in her family and friends.  She was excited to tell me about her birthday that just passed this weekend.  Her main complaint is feeling anxious all the time.  She says Celexa has worked for her symptoms in the past.  She has not been on it recently.  She is agreeable to trying Celexa again and to referral for therapy.  I have asked her to give the Celexa a few weeks to work and to follow-up with me in 1 month.  She was agreeable and thankful for this intervention.  She was provided with mental health resource phone numbers including Mobile Crisis to contact if she is having severe depression/anxiety or thoughts of harming herself. - refill Celexa 20mg  daily (keep low dose due to her age) - refer for mental health services - follow-up in 1 month

## 2014-10-10 NOTE — Assessment & Plan Note (Signed)
She never refilled Fosamax.  She was advised of benefit vs risk (including jaw necrosis and atypical fracture) of this medication.  She is agreeable to restart as I think fracture prevention benefit outweighs less likely risk. - rx for Fosamax qweekly - rx for calcium-vit D

## 2014-10-10 NOTE — Assessment & Plan Note (Signed)
Her reflux has not been responding to OTC Nexium in the past month.  She tried increasing from one pill (20mg ) to two pills with only small improvement.  She denies dysphagia or odynophagia.  Appetite not great in recent months.  Wt stable.  - given her hx of Barrett's and hx of Nissen Fundoplication last year will re-refer to GI for EGD and further management

## 2014-10-10 NOTE — Assessment & Plan Note (Signed)
+  S1 +S2, she is asymptomatic - no syncope, angina or dyspnea.  Will plan for repeat ECHO in 2017 or sooner if she develops symptoms.

## 2014-10-10 NOTE — Assessment & Plan Note (Signed)
Given recent increase in GERD symptoms (s/p Nissen fundoplication) with minimal improvement with Nexium, will refer back to GI for EGD.

## 2014-10-15 NOTE — Progress Notes (Signed)
Internal Medicine Clinic Attending  Case discussed with Dr. Wilson soon after the resident saw the patient.  We reviewed the resident's history and exam and pertinent patient test results.  I agree with the assessment, diagnosis, and plan of care documented in the resident's note.  

## 2014-10-23 ENCOUNTER — Telehealth: Payer: Self-pay | Admitting: Licensed Clinical Social Worker

## 2014-10-23 NOTE — Telephone Encounter (Signed)
[name] was referred to CSW for behavioral health referral. CSW utilized [employer] directory.  [facility] are both in-network and offer psychiatry and therapy services.  CSW placed called to pt.  CSW unable to leave message as voicemail was full.

## 2014-10-31 NOTE — Telephone Encounter (Signed)
Attempted to contact [name], no answer on telephone call.

## 2014-11-05 NOTE — Telephone Encounter (Signed)
Unable to leave voicemail, memory was full.  Letter mailed, CSW will sign off.

## 2014-11-09 ENCOUNTER — Emergency Department (HOSPITAL_COMMUNITY): Payer: Medicare HMO

## 2014-11-09 ENCOUNTER — Emergency Department (HOSPITAL_COMMUNITY)
Admission: EM | Admit: 2014-11-09 | Discharge: 2014-11-09 | Disposition: A | Discharge status: Home / Self Care | Payer: Medicare HMO | Attending: Emergency Medicine | Admitting: Emergency Medicine

## 2014-11-09 ENCOUNTER — Encounter (HOSPITAL_COMMUNITY): Payer: Self-pay | Admitting: Emergency Medicine

## 2014-11-09 DIAGNOSIS — G8929 Other chronic pain: Secondary | ICD-10-CM | POA: Diagnosis not present

## 2014-11-09 DIAGNOSIS — M1990 Unspecified osteoarthritis, unspecified site: Secondary | ICD-10-CM

## 2014-11-09 DIAGNOSIS — Z87891 Personal history of nicotine dependence: Secondary | ICD-10-CM | POA: Insufficient documentation

## 2014-11-09 DIAGNOSIS — M7989 Other specified soft tissue disorders: Secondary | ICD-10-CM | POA: Diagnosis not present

## 2014-11-09 DIAGNOSIS — Z79899 Other long term (current) drug therapy: Secondary | ICD-10-CM | POA: Diagnosis not present

## 2014-11-09 DIAGNOSIS — Z8639 Personal history of other endocrine, nutritional and metabolic disease: Secondary | ICD-10-CM | POA: Diagnosis not present

## 2014-11-09 DIAGNOSIS — M79644 Pain in right finger(s): Secondary | ICD-10-CM | POA: Diagnosis present

## 2014-11-09 DIAGNOSIS — Z86718 Personal history of other venous thrombosis and embolism: Secondary | ICD-10-CM | POA: Insufficient documentation

## 2014-11-09 DIAGNOSIS — I10 Essential (primary) hypertension: Secondary | ICD-10-CM | POA: Diagnosis not present

## 2014-11-09 DIAGNOSIS — Z9842 Cataract extraction status, left eye: Secondary | ICD-10-CM | POA: Diagnosis not present

## 2014-11-09 DIAGNOSIS — M19041 Primary osteoarthritis, right hand: Secondary | ICD-10-CM | POA: Diagnosis not present

## 2014-11-09 DIAGNOSIS — G43909 Migraine, unspecified, not intractable, without status migrainosus: Secondary | ICD-10-CM | POA: Insufficient documentation

## 2014-11-09 DIAGNOSIS — F329 Major depressive disorder, single episode, unspecified: Secondary | ICD-10-CM | POA: Insufficient documentation

## 2014-11-09 DIAGNOSIS — K219 Gastro-esophageal reflux disease without esophagitis: Secondary | ICD-10-CM | POA: Insufficient documentation

## 2014-11-09 DIAGNOSIS — Z862 Personal history of diseases of the blood and blood-forming organs and certain disorders involving the immune mechanism: Secondary | ICD-10-CM | POA: Insufficient documentation

## 2014-11-09 DIAGNOSIS — Z8709 Personal history of other diseases of the respiratory system: Secondary | ICD-10-CM | POA: Diagnosis not present

## 2014-11-09 DIAGNOSIS — F419 Anxiety disorder, unspecified: Secondary | ICD-10-CM | POA: Diagnosis not present

## 2014-11-09 MED ORDER — TRAMADOL HCL 50 MG PO TABS: 50.0000 mg | ORAL_TABLET | Freq: Four times a day (QID) | ORAL | Status: DC | PRN

## 2014-11-09 MED ORDER — TRAMADOL HCL 50 MG PO TABS
50.0000 mg | ORAL_TABLET | Freq: Once | ORAL | Status: AC
  Administered 2014-11-09: 50 mg via ORAL
  Filled 2014-11-09: qty 1

## 2014-11-09 NOTE — ED Notes (Signed)
Pt complaint of right thumb pain for 2 weeks; denies injury.

## 2014-11-09 NOTE — ED Provider Notes (Signed)
CSN: [id number]     Arrival date & time 11/09/14  1535 History   First MD Initiated Contact with Patient 11/09/14 1557     Chief Complaint  Patient presents with  . Hand Pain     (Consider location/radiation/quality/duration/timing/severity/associated sxs/prior Treatment) The history is provided by the patient.  Sherri Mcmillan is a 81 y.o. female hx of aortic stenosis, barrett's esophagus, HTN here with right thumb pain. The thumb pain for the last 2 weeks. Noticed increasing swelling in the right thumb area. Denies any fevers or chills. Denies any falls or injury. Denies any history of arthritis or gout.    Past Medical History  Diagnosis Date  . Chronic pain     DJD knees, back, migranes, LLE varicose veins, and LLE neuropathy.   . Aortic stenosis     Dx ECHO 2008 , mild and aymptomatic , needs ECHO q 3-5 yrs  . Barrett's esophagus     Demonstrated on EGD 12/2010. EGD 09/17/2012 shows inflamed GE junctional mucosa without metaplasia, dysplasia, or malignancy.  . Chronic insomnia   . Chronic anxiety     Admission to [facility] (90s or early 2000)  for 6 weeks after mother died. Complicated again by death of her sister 2010. 2012 developed hallucinations and I refused to refill controlled meds unless she see psych which she is not agreable to  . Elevated cholesterol 10/11    LDL 155. Her 10 year risk (decreasing her age to 3 as the calculator won't go to age 36) is 21% ish. If consider her non smoker (which I wouldn't even though she says 1 pak lasts 2 weeks) risk is 12% ish. So goal for LDL is 130 or 100.  Marland Kitchen HTN (hypertension)     Controlled with 2 drug therapy  . Cataract   . Depression   . Gastroparesis     Demonstrated on GES 12/2010 by Dr Dalene Seltzer  . Bronchitis     hx of  . Left leg DVT 09/28/2012  . Anemia 12/14/2012  . GERD (gastroesophageal reflux disease)   . H/O hiatal hernia   . Migraine     sometimes  . DVT (deep venous thrombosis)    Past Surgical History  Procedure  Laterality Date  . Direct laryngoscopy   September 2008     preoperative diagnosis hoarseness with anterior right vocal cord lesion -  direct laryngoscopy and excisional biopsy of right anterior vocal cord lesion done by Dr. Ezzard Standing  . Meniscectomy   July 2002     preoperative diagnosis torn medial meniscus right knee, partial medial meniscectomy, debridement chondroplasty patellofemoral joint, done by Dr. Madelon Lips  . Abdominal hysterectomy    . Hemorrhoid surgery    . Appendectomy    . Cataract extraction      left eye  . Polypectomy  2000    Dr.Magod  . Colonoscopy  2000&2005    Dr.Magod  . Esophagogastroduodenoscopy  11/2010  . Knee arthroscopy    . Eye surgery    . Rotator cuff repair  09/21/2011    rt shoulder  . Esophagogastroduodenoscopy N/A 09/17/2012    Procedure: ESOPHAGOGASTRODUODENOSCOPY (EGD);  Surgeon: Hart Carwin, MD;  Location: [facility] ENDOSCOPY;  Service: Endoscopy;  Laterality: N/A;  . Esophagogastroduodenoscopy N/A 12/17/2012    Procedure: ESOPHAGOGASTRODUODENOSCOPY (EGD);  Surgeon: Beverley Fiedler, MD;  Location: [facility] ENDOSCOPY;  Service: Gastroenterology;  Laterality: N/A;  . Laparoscopic nissen fundoplication N/A 05/02/2013    Procedure: LAPAROSCOPIC NISSEN FUNDOPLICATION;  Surgeon: Valarie Merino,  MD;  Location: [facility] ORS;  Service: General;  Laterality: N/A;  . Endovenous ablation saphenous vein w/ laser Left 05-09-2014    EVLA left small saphenous vein by Gretta Beganodd Early MD   Family History  Problem Relation Age of Onset  . Stroke Neg Hx   . Cancer Neg Hx   . Colon cancer Neg Hx   . Anesthesia problems Neg Hx   . Hypotension Neg Hx   . Malignant hyperthermia Neg Hx   . Pseudochol deficiency Neg Hx   . Heart disease Mother   . Hypertension Mother   . Heart attack Mother   . Hypertension Father   . Heart attack Father   . Diabetes Brother    History  Substance Use Topics  . Smoking status: Former Smoker -- 50 years    Types: Cigarettes    Quit date: 06/01/2010  .  Smokeless tobacco: Never Used  . Alcohol Use: No     Comment: quit 24 years ago   OB History    No data available     Review of Systems  Musculoskeletal:       R thumb pain   All other systems reviewed and are negative.     Allergies  Aspirin  Home Medications   Prior to Admission medications   Medication Sig Start Date End Date Taking? Authorizing Provider  albuterol (PROVENTIL HFA;VENTOLIN HFA) 108 (90 BASE) MCG/ACT inhaler Inhale 2 puffs into the lungs every 2 (two) hours as needed for wheezing or shortness of breath (cough). 09/07/14   Lorenda HatchetAdam L Rothman, MD  alendronate (FOSAMAX) 70 MG tablet Take 1 tablet (70 mg total) by mouth once a week. Take with a full glass of water on an empty stomach. 10/10/14   Yolanda MangesAlex M Wilson, DO  amLODipine (NORVASC) 10 MG tablet TAKE 1 TABLET (10 MG TOTAL) BY MOUTH DAILY WITH BREAKFAST. 10/10/14   Yolanda MangesAlex M Wilson, DO  calcium-vitamin D (OSCAL WITH D) 500-200 MG-UNIT per tablet Take 1 tablet by mouth 2 (two) times daily. Patient not taking: Reported on 10/09/2014 09/07/14   Lorenda HatchetAdam L Rothman, MD  citalopram (CELEXA) 20 MG tablet Take 1 tablet (20 mg total) by mouth daily. 10/09/14   Yolanda MangesAlex M Wilson, DO  esomeprazole (NEXIUM) 20 MG capsule Take 20 mg by mouth daily at 12 noon.    Historical Provider, MD  furosemide (LASIX) 40 MG tablet Take 1 tablet (40 mg total) by mouth daily. 10/10/14   Yolanda MangesAlex M Wilson, DO   BP 123/86 mmHg  Pulse 73  Temp(Src) 98.2 F (36.8 C) (Oral)  Resp 18  SpO2 97%  LMP 04/26/1950 Physical Exam  Constitutional: She is oriented to person, place, and time. She appears well-developed and well-nourished.  HENT:  Head: Normocephalic.  Eyes: Pupils are equal, round, and reactive to light.  Neck: Normal range of motion.  Cardiovascular: Normal rate.   Pulmonary/Chest: Effort normal.  Abdominal: Soft.  Musculoskeletal:  R thumb with swelling MCP joint. Dec ROM from pain. Tenderness R 1st metacarpal area as well. Nl capillary refill. 2+  pulses.   Neurological: She is alert and oriented to person, place, and time.  Skin: Skin is warm and dry.  Psychiatric: She has a normal mood and affect. Her behavior is normal. Judgment and thought content normal.  Nursing note and vitals reviewed.   ED Course  Procedures (including critical care time) Labs Review Labs Reviewed - No data to display  Imaging Review Dg Hand Complete Right  11/09/2014  CLINICAL DATA:  Thumb pain.  Pain for 2 weeks.  EXAM: RIGHT HAND - COMPLETE 3+ VIEW  COMPARISON:  None.  FINDINGS: There is no evidence of fracture or dislocation. There is mild osteoarthritis of the first CMC joint and first MCP joint. Soft tissues are unremarkable.  IMPRESSION: No acute osseous injury of the right hand.   Electronically Signed   By: Elige Ko   On: 11/09/2014 16:49     EKG Interpretation None      MDM   Final diagnoses:  None    Sherri Mcmillan is a 81 y.o. female here with R thumb pain and swelling. Consider arthritis, less likely gout. No signs of septic joint. Will get xray and give tramadol.   5:00 PM Xray showed arthritis. Will dc home with tramadol, hand surgery f/u.     Richardean Canal, MD 11/09/14 [phone]

## 2014-11-09 NOTE — Discharge Instructions (Signed)
Take tylenol for pain.  Take tramadol for severe pain.   See hand surgeon for treatment options.  No heavy lifting.   Return to ER if you have worse pain, more hand swelling.

## 2014-11-12 ENCOUNTER — Telehealth: Payer: Self-pay | Admitting: Internal Medicine

## 2014-11-12 NOTE — Telephone Encounter (Signed)
Call to patient to confirm appointment for 11/13/14 at 9:45 mailbox full

## 2014-11-13 ENCOUNTER — Ambulatory Visit (INDEPENDENT_AMBULATORY_CARE_PROVIDER_SITE_OTHER): Payer: Medicare HMO | Admitting: Internal Medicine

## 2014-11-13 ENCOUNTER — Encounter: Payer: Self-pay | Admitting: Internal Medicine

## 2014-11-13 VITALS — BP 128/75 | HR 70 | Temp 97.8°F | Ht 60.0 in | Wt 121.0 lb

## 2014-11-13 DIAGNOSIS — F419 Anxiety disorder, unspecified: Secondary | ICD-10-CM

## 2014-11-13 DIAGNOSIS — M79644 Pain in right finger(s): Secondary | ICD-10-CM | POA: Diagnosis not present

## 2014-11-13 DIAGNOSIS — F418 Other specified anxiety disorders: Secondary | ICD-10-CM | POA: Diagnosis not present

## 2014-11-13 DIAGNOSIS — F329 Major depressive disorder, single episode, unspecified: Secondary | ICD-10-CM

## 2014-11-13 NOTE — Assessment & Plan Note (Addendum)
Pain at right thumb base (MCP) x 2 weeks.  Evaluated in ED 4 days ago.  She also reports swelling but none evident on today's exam.  She denies recent trauma or overuse.  No other joints painful.  Tramadol prescribed by ED not helping.  ROM normal, no swelling, but significant pain at base of thumb.  No locking of thumb to suggest trigger finger.  No pain in anatomical snuff box, no increased joint laxity.  Negative Finkelstein's and no pain at radial stylus to suggest [employer]'s.  No redness, swelling to suggest gout or septic joint (also no systemic symptoms).  Xrays negative for fracture, reveals OA.   - referral to Sports Med, they may be able to inject steroid since one joint affect. - she has ASA allergy (rash) and says other NSAIDs don't work for her (I am hesitant to suggest other NSAIDs anyway given her ASA allergy); advised she continue Tylenol prn, try ice to the area - she is wearing a wrist splint she borrowed from someone, I used ACE wrap to immobilize her thumb, advised she rest the thumb until symptoms improve

## 2014-11-13 NOTE — Assessment & Plan Note (Addendum)
She says mood better since resuming Celexa but she is feeling a little down because of health issues (current thumb pain and also seeing her eye doctor for glaucoma).  She denies SI or HI.  She is agreeable to continue Celexa and waiting on contact with behavioral health (CSW was unable to reach her by phone). - continue Celexa - contact CSW and provided additional phone numbers where patient can be reached to provide behavioral health resources

## 2014-11-13 NOTE — Patient Instructions (Addendum)
1. I am referring you to Sports Medicine for your thumb pain.  Keep the splint on and try ice to the thumb.  Since you cannot take NSAID medication, try Tylenol as needed.   2. Please take all medications as prescribed.    3. If you have worsening of your symptoms or new symptoms arise, please call the clinic [phone]), or go to the ER immediately if symptoms are severe.   Please come back to see me in 2 months or sooner if you have problems.

## 2014-11-13 NOTE — Progress Notes (Signed)
Subjective:    Patient ID: Sherri Mcmillan, female    DOB: 1932-11-16, 81 y.o.   MRN: [id number]  HPI Comments: Sherri Mcmillan is an 81 year old woman with PMH as below here for ED follow-up.  Please see problem based charting for assessment and plan.    Past Medical History  Diagnosis Date  . Chronic pain     DJD knees, back, migranes, LLE varicose veins, and LLE neuropathy.   . Aortic stenosis     Dx ECHO 2008 , mild and aymptomatic , needs ECHO q 3-5 yrs  . Barrett's esophagus     Demonstrated on EGD 12/2010. EGD 09/17/2012 shows inflamed GE junctional mucosa without metaplasia, dysplasia, or malignancy.  . Chronic insomnia   . Chronic anxiety     [facility]s or early 2000)  for 6 weeks after mother died. Complicated again by death of her sister 2010. 2012 developed hallucinations and I refused to refill controlled meds unless she see psych which she is not agreable to  . Elevated cholesterol 10/11    LDL 155. Her 10 year risk (decreasing her age to [age] as the calculator won't go to age 81) is 21% ish. If consider her non smoker (which I wouldn't even though she says 1 pak lasts 2 weeks) risk is 12% ish. So goal for LDL is 130 or 100.  Marland Kitchen. HTN (hypertension)     Controlled with 2 drug therapy  . Cataract   . Depression   . Gastroparesis     Demonstrated on GES 12/2010 by Dr Dalene SeltzerKapland  . Bronchitis     hx of  . Left leg DVT 09/28/2012  . Anemia 12/14/2012  . GERD (gastroesophageal reflux disease)   . H/O hiatal hernia   . Migraine     sometimes  . DVT (deep venous thrombosis)    Current Outpatient Prescriptions on File Prior to Visit  Medication Sig Dispense Refill  . albuterol (PROVENTIL HFA;VENTOLIN HFA) 108 (90 BASE) MCG/ACT inhaler Inhale 2 puffs into the lungs every 2 (two) hours as needed for wheezing or shortness of breath (cough). 1 Inhaler 1  . alendronate (FOSAMAX) 70 MG tablet Take 1 tablet (70 mg total) by mouth once a week. Take with a full glass of water on an  empty stomach. 4 tablet 5  . amLODipine (NORVASC) 10 MG tablet TAKE 1 TABLET (10 MG TOTAL) BY MOUTH DAILY WITH BREAKFAST. 30 tablet 3  . calcium-vitamin D (OSCAL WITH D) 500-200 MG-UNIT per tablet Take 1 tablet by mouth 2 (two) times daily. (Patient not taking: Reported on 10/09/2014) 30 tablet 0  . citalopram (CELEXA) 20 MG tablet Take 1 tablet (20 mg total) by mouth daily. 30 tablet 3  . esomeprazole (NEXIUM) 20 MG capsule Take 20 mg by mouth daily at 12 noon.    . furosemide (LASIX) 40 MG tablet Take 1 tablet (40 mg total) by mouth daily. 30 tablet 1  . traMADol (ULTRAM) 50 MG tablet Take 1 tablet (50 mg total) by mouth every 6 (six) hours as needed. 10 tablet 0  . [DISCONTINUED] Calcium Carb-Cholecalciferol 600-800 MG-UNIT TABS Take 600-800 mg by mouth 2 (two) times daily. (Patient not taking: Reported on 09/02/2014) 60 tablet 2  . [DISCONTINUED] propranolol (INDERAL) 40 MG tablet Take 1 tablet (40 mg total) by mouth 2 (two) times daily. (Patient not taking: Reported on 09/02/2014) 60 tablet 1   No current facility-administered medications on file prior to visit.    Review  of Systems  Constitutional: Negative for fever, chills and appetite change.  Respiratory: Negative for shortness of breath.   Cardiovascular: Negative for chest pain, palpitations and leg swelling.  Gastrointestinal: Negative for nausea, vomiting, abdominal pain, diarrhea, constipation and blood in stool.       Dysphagia   Psychiatric/Behavioral: Negative for suicidal ideas and dysphoric mood.       Mood improved on Celexa.       Filed Vitals:   11/13/14 1518  BP: 128/75  Pulse: 70  Temp: 97.8 F (36.6 C)  TempSrc: Oral  Height: 5' (1.524 m)  Weight: 121 lb (54.885 kg)  SpO2: 100%    Objective:   Physical Exam  Constitutional: She is oriented to person, place, and time. She appears well-developed. No distress.  HENT:  Head: Normocephalic and atraumatic.  Mouth/Throat: Oropharynx is clear and moist. No  oropharyngeal exudate.  Eyes: EOM are normal.  Neck: Neck supple.  Cardiovascular: Normal rate and regular rhythm.  Exam reveals no gallop and no friction rub.   Murmur heard. +S1 +S2, systolic murmur  Pulmonary/Chest: Effort normal and breath sounds normal. No respiratory distress. She has no wheezes. She has no rales.  Abdominal: Soft. Bowel sounds are normal.  Musculoskeletal: Normal range of motion. She exhibits tenderness. She exhibits no edema.  Normal thumb ROM despite pain.  MCP is TTP but not red or swollen.  Thumb has good strength and no increased joint laxity with varus/valgus.  No pain with Finkelstein's test, no tenderness of anatomical snuff box, no tenderness of radial styloid.  Other small joints of hand and wrist are nontender.  Neurological: She is alert and oriented to person, place, and time. No cranial nerve deficit.  Skin: Skin is warm. She is not diaphoretic.  Psychiatric: She has a normal mood and affect. Her behavior is normal.  Vitals reviewed.         Assessment & Plan:  Please see problem based charting for assessment and plan.

## 2014-11-14 ENCOUNTER — Telehealth: Payer: Self-pay | Admitting: *Deleted

## 2014-11-14 ENCOUNTER — Other Ambulatory Visit: Payer: Self-pay | Admitting: Internal Medicine

## 2014-11-14 NOTE — Telephone Encounter (Signed)
Calls and states she cannot take the pain, wants tramadol and the cream

## 2014-11-14 NOTE — Telephone Encounter (Signed)
Call to [facility] to schedule appointment for patient per order of Dr. Andrey Campanile.  Patient had called and scheduled own appointment for 12/18/2014 at 10:15 AM.  Call to [facility] appointment with Dr. Ewing Schlein was cancelled per order of Dr. Andrey Campanile as patient will see Dr. Arlyce Dice instead.  Angelina Ok, RN 11/14/2014 8:40 AM.

## 2014-11-14 NOTE — Progress Notes (Signed)
Medicine attending: Medical history, presenting problems, physical findings, and medications, reviewed with Dr Alex Wilson on the day of the patient visit   and I concur with her evaluation and management plan. 

## 2014-11-15 ENCOUNTER — Telehealth: Payer: Self-pay | Admitting: Gastroenterology

## 2014-11-15 NOTE — Telephone Encounter (Signed)
Spoke with patient and she had a colonoscopy recently with Dr. Ewing Schlein. Explained to patient that she will have to have records from Dr. Ewing Schlein sent to Dr. Arlyce Dice to review and he will decide if he takes her back. Cancelled OV on 12/18/14.

## 2014-11-19 ENCOUNTER — Encounter (HOSPITAL_COMMUNITY): Payer: Self-pay | Admitting: Emergency Medicine

## 2014-11-19 ENCOUNTER — Emergency Department (HOSPITAL_COMMUNITY)
Admission: EM | Admit: 2014-11-19 | Discharge: 2014-11-19 | Disposition: A | Discharge status: Home / Self Care | Payer: Medicare HMO | Attending: Emergency Medicine | Admitting: Emergency Medicine

## 2014-11-19 ENCOUNTER — Telehealth: Payer: Self-pay | Admitting: *Deleted

## 2014-11-19 DIAGNOSIS — Z86718 Personal history of other venous thrombosis and embolism: Secondary | ICD-10-CM | POA: Insufficient documentation

## 2014-11-19 DIAGNOSIS — F419 Anxiety disorder, unspecified: Secondary | ICD-10-CM | POA: Diagnosis not present

## 2014-11-19 DIAGNOSIS — I10 Essential (primary) hypertension: Secondary | ICD-10-CM | POA: Diagnosis not present

## 2014-11-19 DIAGNOSIS — M79641 Pain in right hand: Secondary | ICD-10-CM | POA: Diagnosis present

## 2014-11-19 DIAGNOSIS — Z79899 Other long term (current) drug therapy: Secondary | ICD-10-CM | POA: Diagnosis not present

## 2014-11-19 DIAGNOSIS — F329 Major depressive disorder, single episode, unspecified: Secondary | ICD-10-CM | POA: Insufficient documentation

## 2014-11-19 DIAGNOSIS — K219 Gastro-esophageal reflux disease without esophagitis: Secondary | ICD-10-CM | POA: Insufficient documentation

## 2014-11-19 DIAGNOSIS — G8929 Other chronic pain: Secondary | ICD-10-CM | POA: Diagnosis not present

## 2014-11-19 DIAGNOSIS — G43909 Migraine, unspecified, not intractable, without status migrainosus: Secondary | ICD-10-CM | POA: Diagnosis not present

## 2014-11-19 DIAGNOSIS — M1990 Unspecified osteoarthritis, unspecified site: Secondary | ICD-10-CM | POA: Diagnosis not present

## 2014-11-19 DIAGNOSIS — Z87891 Personal history of nicotine dependence: Secondary | ICD-10-CM | POA: Insufficient documentation

## 2014-11-19 DIAGNOSIS — Z862 Personal history of diseases of the blood and blood-forming organs and certain disorders involving the immune mechanism: Secondary | ICD-10-CM | POA: Insufficient documentation

## 2014-11-19 MED ORDER — TRAMADOL HCL 50 MG PO TABS
50.0000 mg | ORAL_TABLET | Freq: Once | ORAL | Status: AC
  Administered 2014-11-19: 50 mg via ORAL
  Filled 2014-11-19: qty 1

## 2014-11-19 MED ORDER — TRAMADOL HCL 50 MG PO TABS: 50.0000 mg | ORAL_TABLET | Freq: Four times a day (QID) | ORAL | Status: DC | PRN

## 2014-11-19 NOTE — ED Notes (Signed)
Pt asking where the doctor is and complaining that she has had to wait so long. RN reassured her that the PA is coming as soon as he can but that we have been very busy and he will be by as soon as possible.

## 2014-11-19 NOTE — Discharge Instructions (Signed)
Arthritis, Nonspecific °Arthritis is inflammation of a joint. This usually means pain, redness, warmth or swelling are present. One or more joints may be involved. There are a number of types of arthritis. Your caregiver may not be able to tell what type of arthritis you have right away. °CAUSES  °The most common cause of arthritis is the wear and tear on the joint (osteoarthritis). This causes damage to the cartilage, which can break down over time. The knees, hips, back and neck are most often affected by this type of arthritis. °Other types of arthritis and common causes of joint pain include: °· Sprains and other injuries near the joint. Sometimes minor sprains and injuries cause pain and swelling that develop hours later. °· Rheumatoid arthritis. This affects hands, feet and knees. It usually affects both sides of your body at the same time. It is often associated with chronic ailments, fever, weight loss and general weakness. °· Crystal arthritis. Gout and pseudo gout can cause occasional acute severe pain, redness and swelling in the foot, ankle, or knee. °· Infectious arthritis. Bacteria can get into a joint through a break in overlying skin. This can cause infection of the joint. Bacteria and viruses can also spread through the blood and affect your joints. °· Drug, infectious and allergy reactions. Sometimes joints can become mildly painful and slightly swollen with these types of illnesses. °SYMPTOMS  °· Pain is the main symptom. °· Your joint or joints can also be red, swollen and warm or hot to the touch. °· You may have a fever with certain types of arthritis, or even feel overall ill. °· The joint with arthritis will hurt with movement. Stiffness is present with some types of arthritis. °DIAGNOSIS  °Your caregiver will suspect arthritis based on your description of your symptoms and on your exam. Testing may be needed to find the type of arthritis: °· Blood and sometimes urine tests. °· X-ray tests  and sometimes CT or MRI scans. °· Removal of fluid from the joint (arthrocentesis) is done to check for bacteria, crystals or other causes. Your caregiver (or a specialist) will numb the area over the joint with a local anesthetic, and use a needle to remove joint fluid for examination. This procedure is only minimally uncomfortable. °· Even with these tests, your caregiver may not be able to tell what kind of arthritis you have. Consultation with a specialist (rheumatologist) may be helpful. °TREATMENT  °Your caregiver will discuss with you treatment specific to your type of arthritis. If the specific type cannot be determined, then the following general recommendations may apply. °Treatment of severe joint pain includes: °· Rest. °· Elevation. °· Anti-inflammatory medication (for example, ibuprofen) may be prescribed. Avoiding activities that cause increased pain. °· Only take over-the-counter or prescription medicines for pain and discomfort as recommended by your caregiver. °· Cold packs over an inflamed joint may be used for 10 to 15 minutes every hour. Hot packs sometimes feel better, but do not use overnight. Do not use hot packs if you are diabetic without your caregiver's permission. °· A cortisone shot into arthritic joints may help reduce pain and swelling. °· Any acute arthritis that gets worse over the next 1 to 2 days needs to be looked at to be sure there is no joint infection. °Long-term arthritis treatment involves modifying activities and lifestyle to reduce joint stress jarring. This can include weight loss. Also, exercise is needed to nourish the joint cartilage and remove waste. This helps keep the muscles   around the joint strong. °HOME CARE INSTRUCTIONS  °· Do not take aspirin to relieve pain if gout is suspected. This elevates uric acid levels. °· Only take over-the-counter or prescription medicines for pain, discomfort or fever as directed by your caregiver. °· Rest the joint as much as  possible. °· If your joint is swollen, keep it elevated. °· Use crutches if the painful joint is in your leg. °· Drinking plenty of fluids may help for certain types of arthritis. °· Follow your caregiver's dietary instructions. °· Try low-impact exercise such as: °¨ Swimming. °¨ Water aerobics. °¨ Biking. °¨ Walking. °· Morning stiffness is often relieved by a warm shower. °· Put your joints through regular range-of-motion. °SEEK MEDICAL CARE IF:  °· You do not feel better in 24 hours or are getting worse. °· You have side effects to medications, or are not getting better with treatment. °SEEK IMMEDIATE MEDICAL CARE IF:  °· You have a fever. °· You develop severe joint pain, swelling or redness. °· Many joints are involved and become painful and swollen. °· There is severe back pain and/or leg weakness. °· You have loss of bowel or bladder control. °Document Released: 05/20/2004 Document Revised: 07/05/2011 Document Reviewed: 06/05/2008 °[employer]® Patient Information ©2015 [employer]. This information is not intended to replace advice given to you by your health care provider. Make sure you discuss any questions you have with your health care provider. ° °

## 2014-11-19 NOTE — Telephone Encounter (Signed)
Dr Andrey Campanile, pt would like for you to call her at [phone]

## 2014-11-19 NOTE — ED Notes (Signed)
Pt states she was previously evaluated for her R thumb pain but has not had any pain relief. States it is arthritis but her doctor has not sent in a consult for a hand doctor yet. Alert and oriented.

## 2014-11-19 NOTE — Telephone Encounter (Signed)
No answer at this number so I left a message asking that she call clinic.

## 2014-11-19 NOTE — ED Provider Notes (Signed)
CSN: [id number]     Arrival date & time 11/19/14  1721 History  This chart was scribed for non-physician practitioner, Everlene Farrier, PA-C working with Pricilla Loveless, MD, by Abel Presto, ED Scribe. This patient was seen in room WTR6/WTR6 and the patient's care was started at 7:37 PM.      Chief Complaint  Patient presents with  . Hand Pain     The history is provided by the patient. No language interpreter was used.   HPI Comments: Sherri Mcmillan is a 81 y.o. female with PMHx of aortic stenosis, chronic pain, HLD, HTN, DVT, and anemia who presents to the Emergency Department complaining of right thumb with onset 3 weeks ago. Pt reports decreased ROM secondary to pain and noted swelling. Pt with h/o osteoarthritis. Pt was last seen in ED on 11/09/14 and evaluated for right thumb pain and dx with Rx for tramadol. Pt reports tramadol gave her relief and she has been taking Tylenol since she ran out with no relief. Pt states her PCP has not given her a referral for a hand surgeon. Pt denies recent injury. Pt denies fever and elbow pain.    Past Medical History  Diagnosis Date  . Chronic pain     DJD knees, back, migranes, LLE varicose veins, and LLE neuropathy.   . Aortic stenosis     Dx ECHO 2008 , mild and aymptomatic , needs ECHO q 3-5 yrs  . Barrett's esophagus     Demonstrated on EGD 12/2010. EGD 09/17/2012 shows inflamed GE junctional mucosa without metaplasia, dysplasia, or malignancy.  . Chronic insomnia   . Chronic anxiety     Admission to [facility] (90s or early 2000)  for 6 weeks after mother died. Complicated again by death of her sister 2010. 2012 developed hallucinations and I refused to refill controlled meds unless she see psych which she is not agreable to  . Elevated cholesterol 10/11    LDL 155. Her 10 year risk (decreasing her age to 27 as the calculator won't go to age 89) is 21% ish. If consider her non smoker (which I wouldn't even though she says 1 pak lasts 2 weeks) risk  is 12% ish. So goal for LDL is 130 or 100.  Marland Kitchen HTN (hypertension)     Controlled with 2 drug therapy  . Cataract   . Depression   . Gastroparesis     Demonstrated on GES 12/2010 by Dr Dalene Seltzer  . Bronchitis     hx of  . Left leg DVT 09/28/2012  . Anemia 12/14/2012  . GERD (gastroesophageal reflux disease)   . H/O hiatal hernia   . Migraine     sometimes  . DVT (deep venous thrombosis)    Past Surgical History  Procedure Laterality Date  . Direct laryngoscopy   September 2008     preoperative diagnosis hoarseness with anterior right vocal cord lesion -  direct laryngoscopy and excisional biopsy of right anterior vocal cord lesion done by Dr. Ezzard Standing  . Meniscectomy   July 2002     preoperative diagnosis torn medial meniscus right knee, partial medial meniscectomy, debridement chondroplasty patellofemoral joint, done by Dr. Madelon Lips  . Abdominal hysterectomy    . Hemorrhoid surgery    . Appendectomy    . Cataract extraction      left eye  . Polypectomy  2000    Dr.Magod  . Colonoscopy  2000&2005    Dr.Magod  . Esophagogastroduodenoscopy  11/2010  . Knee arthroscopy    .  Eye surgery    . Rotator cuff repair  09/21/2011    rt shoulder  . Esophagogastroduodenoscopy N/A 09/17/2012    Procedure: ESOPHAGOGASTRODUODENOSCOPY (EGD);  Surgeon: Hart Carwin, MD;  Location: [facility] ENDOSCOPY;  Service: Endoscopy;  Laterality: N/A;  . Esophagogastroduodenoscopy N/A 12/17/2012    Procedure: ESOPHAGOGASTRODUODENOSCOPY (EGD);  Surgeon: Beverley Fiedler, MD;  Location: [facility] ENDOSCOPY;  Service: Gastroenterology;  Laterality: N/A;  . Laparoscopic nissen fundoplication N/A 05/02/2013    Procedure: LAPAROSCOPIC NISSEN FUNDOPLICATION;  Surgeon: Valarie Merino, MD;  Location: [facility] ORS;  Service: General;  Laterality: N/A;  . Endovenous ablation saphenous vein w/ laser Left 05-09-2014    EVLA left small saphenous vein by Gretta Began MD   Family History  Problem Relation Age of Onset  . Stroke Neg Hx   . Cancer Neg  Hx   . Colon cancer Neg Hx   . Anesthesia problems Neg Hx   . Hypotension Neg Hx   . Malignant hyperthermia Neg Hx   . Pseudochol deficiency Neg Hx   . Heart disease Mother   . Hypertension Mother   . Heart attack Mother   . Hypertension Father   . Heart attack Father   . Diabetes Brother    History  Substance Use Topics  . Smoking status: Former Smoker -- 50 years    Types: Cigarettes    Quit date: 06/01/2010  . Smokeless tobacco: Never Used  . Alcohol Use: No     Comment: quit 24 years ago   OB History    No data available     Review of Systems  Constitutional: Negative for fever and chills.  Musculoskeletal: Positive for joint swelling and arthralgias.      Allergies  Aspirin  Home Medications   Prior to Admission medications   Medication Sig Start Date End Date Taking? Authorizing Provider  acetaminophen (TYLENOL) 500 MG tablet Take 1,000-1,500 mg by mouth every 6 (six) hours as needed for mild pain, moderate pain or headache.   Yes Historical Provider, MD  albuterol (PROVENTIL HFA;VENTOLIN HFA) 108 (90 BASE) MCG/ACT inhaler Inhale 2 puffs into the lungs every 2 (two) hours as needed for wheezing or shortness of breath (cough). 09/07/14  Yes Lorenda Hatchet, MD  amLODipine (NORVASC) 10 MG tablet TAKE 1 TABLET (10 MG TOTAL) BY MOUTH DAILY WITH BREAKFAST. 10/10/14  Yes Alex Elson Clan, DO  citalopram (CELEXA) 20 MG tablet Take 1 tablet (20 mg total) by mouth daily. 10/09/14  Yes [name], DO  dorzolamide-timolol (COSOPT) 22.3-6.8 MG/ML ophthalmic solution Place 1 drop into the left eye 2 (two) times daily.   Yes Historical Provider, MD  esomeprazole (NEXIUM) 20 MG capsule Take 20 mg by mouth daily at 12 noon.   Yes Historical Provider, MD  furosemide (LASIX) 40 MG tablet Take 1 tablet (40 mg total) by mouth daily. 10/10/14  Yes Alex Elson Clan, DO  latanoprost (XALATAN) 0.005 % ophthalmic solution Place 1 drop into the left eye at bedtime.   Yes Historical Provider, MD   alendronate (FOSAMAX) 70 MG tablet Take 1 tablet (70 mg total) by mouth once a week. Take with a full glass of water on an empty stomach. Patient not taking: Reported on 11/13/2014 10/10/14   [name], DO  calcium-vitamin D (OSCAL WITH D) 500-200 MG-UNIT per tablet Take 1 tablet by mouth 2 (two) times daily. Patient not taking: Reported on 10/09/2014 09/07/14   Lorenda Hatchet, MD  traMADol (ULTRAM) 50 MG tablet  Take 1 tablet (50 mg total) by mouth every 6 (six) hours as needed for moderate pain or severe pain. 11/19/14   Everlene Farrier, PA-C   BP 117/76 mmHg  Pulse 69  Temp(Src) 98.4 F (36.9 C) (Oral)  Resp 18  SpO2 98%  LMP 04/26/1950 Physical Exam  Constitutional: She appears well-developed and well-nourished. No distress.  Nontoxic appearing.  HENT:  Head: Normocephalic and atraumatic.  Eyes: Right eye exhibits no discharge. Left eye exhibits no discharge.  Cardiovascular: Intact distal pulses and normal pulses.   Intact radial pulses good cap refill  Pulmonary/Chest: Effort normal. No respiratory distress.  Musculoskeletal:  Tenderness to right thumb. No wrist or hand pain. No hand erythema or edema.  Neurological: She is alert. Coordination normal.  Skin: No rash noted. She is not diaphoretic.  Psychiatric: She has a normal mood and affect. Her behavior is normal.  Nursing note and vitals reviewed.   ED Course  Procedures (including critical care time) DIAGNOSTIC STUDIES: Oxygen Saturation is 98% on room air, normal by my interpretation.    COORDINATION OF CARE: 7:44 PM Discussed treatment plan with patient at beside, the patient agrees with the plan and has no further questions at this time.   Labs Review Labs Reviewed - No data to display  Imaging Review No results found.   EKG Interpretation None      Filed Vitals:   11/19/14 1807 11/19/14 1809  BP: 137/101 117/76  Pulse: 69   Temp: 98.4 F (36.9 C)   TempSrc: Oral   Resp: 18   SpO2: 98%      MDM    Meds given in ED:  Medications  traMADol (ULTRAM) tablet 50 mg (not administered)    New Prescriptions   TRAMADOL (ULTRAM) 50 MG TABLET    Take 1 tablet (50 mg total) by mouth every 6 (six) hours as needed for moderate pain or severe pain.    Final diagnoses:  Arthritis   Patient presents to the emergency department complaining of continued right thumb pain. This patient was seen and evaluated in the ED on 11/09/2014 and had x-rays done of her right hand. X-rays indicated osteoarthritis. Patient was given tramadol which she reports she has run out of. She reports that Tylenol has not been helping her pain. She reports she is been unable to follow-up with hand yet. She is requesting more tramadol. On exam patient is afebrile and nontoxic appearing. Patient does have tenderness her right thumb. No hand or wrist pain. She denies injuring her thumb. No deformity. Will discharge with a prescription for tramadol and have her follow-up with hand surgery and PCP. I advised the patient to follow-up with their primary care provider this week. I advised the patient to return to the emergency department with new or worsening symptoms or new concerns. The patient verbalized understanding and agreement with plan.    I personally performed the services described in this documentation, which was scribed in my presence. The recorded information has been reviewed and is accurate.       Everlene Farrier, PA-C 11/19/14 1951  Pricilla Loveless, MD 11/20/14 [phone]

## 2014-11-21 ENCOUNTER — Encounter: Payer: Self-pay | Admitting: *Deleted

## 2014-11-22 ENCOUNTER — Encounter: Payer: Self-pay | Admitting: Internal Medicine

## 2014-11-22 ENCOUNTER — Ambulatory Visit (INDEPENDENT_AMBULATORY_CARE_PROVIDER_SITE_OTHER): Payer: Medicare HMO | Admitting: Internal Medicine

## 2014-11-22 VITALS — BP 116/90 | HR 71 | Temp 98.0°F | Ht 60.0 in | Wt 116.0 lb

## 2014-11-22 DIAGNOSIS — M79644 Pain in right finger(s): Secondary | ICD-10-CM

## 2014-11-22 NOTE — Progress Notes (Signed)
Subjective:    Patient ID: Sherri Mcmillan, female    DOB: 1932/08/04, 81 y.o.   MRN: [id number]  HPI Comments: Sherri Mcmillan is an 81 year old woman with PMH as below here for ED follow-up. Please see problem based charting for assessment and plan.    Past Medical History  Diagnosis Date  . Chronic pain     DJD knees, back, migranes, LLE varicose veins, and LLE neuropathy.   . Aortic stenosis     Dx ECHO 2008 , mild and aymptomatic , needs ECHO q 3-5 yrs  . Barrett's esophagus     Demonstrated on EGD 12/2010. EGD 09/17/2012 shows inflamed GE junctional mucosa without metaplasia, dysplasia, or malignancy.  . Chronic insomnia   . Chronic anxiety     Admission to [facility] (90s or early 2000)  for 6 weeks after mother died. Complicated again by death of her sister 2010. 2012 developed hallucinations and I refused to refill controlled meds unless she see psych which she is not agreable to  . Elevated cholesterol 10/11    LDL 155. Her 10 year risk (decreasing her age to 89 as the calculator won't go to age 66) is 21% ish. If consider her non smoker (which I wouldn't even though she says 1 pak lasts 2 weeks) risk is 12% ish. So goal for LDL is 130 or 100.  Marland Kitchen HTN (hypertension)     Controlled with 2 drug therapy  . Cataract   . Depression   . Gastroparesis     Demonstrated on GES 12/2010 by Sherri Mcmillan  . Bronchitis     hx of  . Left leg DVT 09/28/2012  . Anemia 12/14/2012  . GERD (gastroesophageal reflux disease)   . H/O hiatal hernia   . Migraine     sometimes  . DVT (deep venous thrombosis)    Current Outpatient Prescriptions on File Prior to Visit  Medication Sig Dispense Refill  . acetaminophen (TYLENOL) 500 MG tablet Take 1,000-1,500 mg by mouth every 6 (six) hours as needed for mild pain, moderate pain or headache.    . albuterol (PROVENTIL HFA;VENTOLIN HFA) 108 (90 BASE) MCG/ACT inhaler Inhale 2 puffs into the lungs every 2 (two) hours as needed for wheezing or shortness of breath  (cough). 1 Inhaler 1  . alendronate (FOSAMAX) 70 MG tablet Take 1 tablet (70 mg total) by mouth once a week. Take with a full glass of water on an empty stomach. (Patient not taking: Reported on 11/13/2014) 4 tablet 5  . amLODipine (NORVASC) 10 MG tablet TAKE 1 TABLET (10 MG TOTAL) BY MOUTH DAILY WITH BREAKFAST. 30 tablet 3  . calcium-vitamin D (OSCAL WITH D) 500-200 MG-UNIT per tablet Take 1 tablet by mouth 2 (two) times daily. (Patient not taking: Reported on 10/09/2014) 30 tablet 0  . citalopram (CELEXA) 20 MG tablet Take 1 tablet (20 mg total) by mouth daily. 30 tablet 3  . dorzolamide-timolol (COSOPT) 22.3-6.8 MG/ML ophthalmic solution Place 1 drop into the left eye 2 (two) times daily.    Marland Kitchen esomeprazole (NEXIUM) 20 MG capsule Take 20 mg by mouth daily at 12 noon.    . furosemide (LASIX) 40 MG tablet Take 1 tablet (40 mg total) by mouth daily. 30 tablet 1  . latanoprost (XALATAN) 0.005 % ophthalmic solution Place 1 drop into the left eye at bedtime.    . traMADol (ULTRAM) 50 MG tablet Take 1 tablet (50 mg total) by mouth every 6 (six) hours as needed for  moderate pain or severe pain. 20 tablet 0  . [DISCONTINUED] Calcium Carb-Cholecalciferol 600-800 MG-UNIT TABS Take 600-800 mg by mouth 2 (two) times daily. (Patient not taking: Reported on 09/02/2014) 60 tablet 2  . [DISCONTINUED] propranolol (INDERAL) 40 MG tablet Take 1 tablet (40 mg total) by mouth 2 (two) times daily. (Patient not taking: Reported on 09/02/2014) 60 tablet 1   No current facility-administered medications on file prior to visit.    Review of Systems  Constitutional: Negative for fever and chills.  Respiratory: Negative for shortness of breath.   Gastrointestinal: Negative for nausea and vomiting.  Musculoskeletal:       Thumb pain        Filed Vitals:   11/22/14 1456  BP: 116/90  Pulse: 71  Temp: 98 F (36.7 C)  TempSrc: Oral  Height: 5' (1.524 m)  Weight: 116 lb (52.617 kg)  SpO2: 100%    Objective:    Physical Exam  Constitutional: She is oriented to person, place, and time. She appears well-developed. No distress.  HENT:  Head: Normocephalic and atraumatic.  Mouth/Throat: Oropharynx is clear and moist. No oropharyngeal exudate.  Neck: Neck supple.  Cardiovascular: Normal rate and regular rhythm.  Exam reveals no gallop and no friction rub.   Murmur heard. Pulmonary/Chest: Effort normal and breath sounds normal. No respiratory distress. She has no wheezes. She has no rales.  Abdominal: Soft. Bowel sounds are normal. She exhibits no distension. There is no tenderness. There is no rebound and no guarding.  Musculoskeletal: Normal range of motion. She exhibits no edema or tenderness.  Normal right thumb ROM, no swelling.  She still has tenderness at the base of the thumb.  Neurological: She is alert and oriented to person, place, and time. No cranial nerve deficit.  Skin: Skin is warm. She is not diaphoretic.  Psychiatric: She has a normal mood and affect. Her behavior is normal.  Vitals reviewed.         Assessment & Plan:  Please see problem based charting for assessment and plan.

## 2014-11-22 NOTE — Assessment & Plan Note (Signed)
No change since I saw her last week.  The ER gave her Tramadol which is working ok for her.  She is scheduled to see Dr. Amanda Pea (Hand Surgeon) next week and she is happy with this plan.

## 2014-11-25 NOTE — Progress Notes (Signed)
Medicine attending: Medical history, presenting problems, physical findings, and medications, reviewed with Dr Alex Wilson on the day of the patient visit   and I concur with her evaluation and management plan. 

## 2014-12-18 ENCOUNTER — Ambulatory Visit: Payer: Medicare HMO | Admitting: Gastroenterology

## 2014-12-24 NOTE — Addendum Note (Signed)
Addended by: Neomia Dear on: 12/24/2014 06:28 PM   Modules accepted: Orders

## 2014-12-25 ENCOUNTER — Ambulatory Visit (INDEPENDENT_AMBULATORY_CARE_PROVIDER_SITE_OTHER): Payer: Medicare HMO | Admitting: Internal Medicine

## 2014-12-25 ENCOUNTER — Encounter: Payer: Self-pay | Admitting: Internal Medicine

## 2014-12-25 ENCOUNTER — Encounter: Payer: Self-pay | Admitting: *Deleted

## 2014-12-25 VITALS — BP 159/89 | HR 75 | Temp 98.5°F | Ht 60.0 in | Wt 120.2 lb

## 2014-12-25 DIAGNOSIS — R059 Cough, unspecified: Secondary | ICD-10-CM

## 2014-12-25 DIAGNOSIS — K219 Gastro-esophageal reflux disease without esophagitis: Secondary | ICD-10-CM | POA: Diagnosis not present

## 2014-12-25 DIAGNOSIS — R05 Cough: Secondary | ICD-10-CM | POA: Diagnosis not present

## 2014-12-25 MED ORDER — SUCRALFATE 1 G PO TABS: 1.0000 g | ORAL_TABLET | Freq: Three times a day (TID) | ORAL | Status: DC

## 2014-12-25 MED ORDER — ALBUTEROL SULFATE HFA 108 (90 BASE) MCG/ACT IN AERS: 2.0000 | INHALATION_SPRAY | RESPIRATORY_TRACT | Status: DC | PRN

## 2014-12-25 NOTE — Progress Notes (Signed)
Patient ID: Sherri Mcmillan, female   DOB: [birth year], 81 y.o.   MRN: [id number]   Subjective:   Patient ID: Sherri Mcmillan female   DOB: 19-Oct-1932 81 y.o.   MRN: [id number]  HPI: [name] is a 81 y.o. female with a PMH listed below presents to clinic today with complaints of a 2 week history of cough.  For details of today's visit please refer to problem based charting.  Past Medical History  Diagnosis Date  . Chronic pain     DJD knees, back, migranes, LLE varicose veins, and LLE neuropathy.   . Aortic stenosis     Dx ECHO 2008 , mild and aymptomatic , needs ECHO q 3-5 yrs  . Barrett's esophagus     Demonstrated on EGD 12/2010. EGD 09/17/2012 shows inflamed GE junctional mucosa without metaplasia, dysplasia, or malignancy.  . Chronic insomnia   . Chronic anxiety     Admission to [facility] (90s or early 2000)  for 6 weeks after mother died. Complicated again by death of her sister 2010. 2012 developed hallucinations and I refused to refill controlled meds unless she see psych which she is not agreable to  . Elevated cholesterol 10/11    LDL 155. Her 10 year risk (decreasing her age to 64 as the calculator won't go to age 87) is 21% ish. If consider her non smoker (which I wouldn't even though she says 1 pak lasts 2 weeks) risk is 12% ish. So goal for LDL is 130 or 100.  Marland Kitchen HTN (hypertension)     Controlled with 2 drug therapy  . Cataract   . Depression   . Gastroparesis     Demonstrated on GES 12/2010 by Dr Dalene Seltzer  . Bronchitis     hx of  . Left leg DVT 09/28/2012  . Anemia 12/14/2012  . GERD (gastroesophageal reflux disease)   . H/O hiatal hernia   . Migraine     sometimes  . DVT (deep venous thrombosis)    Current Outpatient Prescriptions  Medication Sig Dispense Refill  . acetaminophen (TYLENOL) 500 MG tablet Take 1,000-1,500 mg by mouth every 6 (six) hours as needed for mild pain, moderate pain or headache.    . albuterol (PROVENTIL HFA;VENTOLIN HFA) 108 (90 BASE) MCG/ACT  inhaler Inhale 2 puffs into the lungs every 2 (two) hours as needed for wheezing or shortness of breath (cough). 1 Inhaler 1  . alendronate (FOSAMAX) 70 MG tablet Take 1 tablet (70 mg total) by mouth once a week. Take with a full glass of water on an empty stomach. (Patient not taking: Reported on 11/13/2014) 4 tablet 5  . amLODipine (NORVASC) 10 MG tablet TAKE 1 TABLET (10 MG TOTAL) BY MOUTH DAILY WITH BREAKFAST. 30 tablet 3  . calcium-vitamin D (OSCAL WITH D) 500-200 MG-UNIT per tablet Take 1 tablet by mouth 2 (two) times daily. (Patient not taking: Reported on 10/09/2014) 30 tablet 0  . citalopram (CELEXA) 20 MG tablet Take 1 tablet (20 mg total) by mouth daily. 30 tablet 3  . dorzolamide-timolol (COSOPT) 22.3-6.8 MG/ML ophthalmic solution Place 1 drop into the left eye 2 (two) times daily.    . furosemide (LASIX) 40 MG tablet Take 1 tablet (40 mg total) by mouth daily. 30 tablet 1  . latanoprost (XALATAN) 0.005 % ophthalmic solution Place 1 drop into the left eye at bedtime.    . pantoprazole (PROTONIX) 40 MG tablet Take 40 mg by mouth 2 (two) times daily.  12  . sucralfate (CARAFATE)  1 G tablet Take 1 tablet (1 g total) by mouth 4 (four) times daily -  with meals and at bedtime. 90 tablet 0  . traMADol (ULTRAM) 50 MG tablet Take 1 tablet (50 mg total) by mouth every 6 (six) hours as needed for moderate pain or severe pain. 20 tablet 0  . [DISCONTINUED] Calcium Carb-Cholecalciferol 600-800 MG-UNIT TABS Take 600-800 mg by mouth 2 (two) times daily. (Patient not taking: Reported on 09/02/2014) 60 tablet 2  . [DISCONTINUED] propranolol (INDERAL) 40 MG tablet Take 1 tablet (40 mg total) by mouth 2 (two) times daily. (Patient not taking: Reported on 09/02/2014) 60 tablet 1   No current facility-administered medications for this visit.   Family History  Problem Relation Age of Onset  . Stroke Neg Hx   . Cancer Neg Hx   . Colon cancer Neg Hx   . Anesthesia problems Neg Hx   . Hypotension Neg Hx   .  Malignant hyperthermia Neg Hx   . Pseudochol deficiency Neg Hx   . Heart disease Mother   . Hypertension Mother   . Heart attack Mother   . Hypertension Father   . Heart attack Father   . Diabetes Brother    Social History   Social History  . Marital Status: Single    Spouse Name: N/A  . Number of Children: N/A  . Years of Education: N/A   Social History Main Topics  . Smoking status: Former Smoker -- 50 years    Types: Cigarettes    Quit date: 06/01/2010  . Smokeless tobacco: Never Used  . Alcohol Use: No     Comment: quit 24 years ago  . Drug Use: No  . Sexual Activity: Not Asked   Other Topics Concern  . 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  HENT: Negative for congestion and sore throat.   Respiratory: Positive for cough and sputum production. Negative for shortness of breath and wheezing.   Cardiovascular: Negative for chest pain, palpitations and orthopnea.  Gastrointestinal: Positive for heartburn and vomiting. Negative for abdominal pain, diarrhea, constipation and blood in stool.  Neurological: Negative for headaches.   Objective:  Physical Exam: Filed Vitals:   12/25/14 0843  BP: 159/89  Pulse: 75  Temp: 98.5 F (36.9 C)  TempSrc: Oral  Height: 5' (1.524 m)  Weight: 120 lb 3.2 oz (54.522 kg)  SpO2: 100%   GENERAL- alert, co-operative, appears as stated age, not in any distress. HEENT- Atraumatic, normocephalic, PERRL, EOMI, oral mucosa appears moist, good and intact dentition. No carotid bruit, no cervical LN enlargement,neck supple. CARDIAC- RRR,  2/6 systolic murmur, no rubs or gallops. RESP- Moving equal volumes of air, and clear to auscultation bilaterally, no wheezes or crackles. ABDOMEN- Soft, nontender, no guarding or rebound, bowel sounds present. NEURO- No obvious Cr N abnormality EXTREMITIES- pulse 2+, symmetric, trace pedal edema. SKIN- Warm, dry, No rash or lesion. PSYCH- Normal mood and affect, appropriate thought content and speech.  Assessment & Plan:   Case discussed with  Dr. Dalphine Handing. Please refer to Problem based carting for further details of today's visit.

## 2014-12-25 NOTE — Patient Instructions (Signed)
Thank you for coming in today  - I am giving you a prescription for Carafate today. Take it 3 times a day with meals.  - Stop taking the Nexium. Continue the Protonix.  - We will call Dr. Ewing Schlein and call you with an appointment - Please fill your Norvasc and take it - If symptoms do not improve in 1 week please call the clinic

## 2014-12-25 NOTE — Assessment & Plan Note (Signed)
Patient on Nexium and Protonix. Stopping Nexium today, continue Protonix. Recently seen by Dr. Ewing Schlein, will get record. See cough note for further details.

## 2014-12-25 NOTE — Progress Notes (Signed)
81 year old female with long time GERD & Barretts, complaining of heartburn and cough at night which keeps her propped up. No dysphagia or odynophagia. She is also a former >30 pack year smoker, and might have asthma/COPD although she does not endorse shortness of breath and wheezing. She has changed multiple GI physicians and has recently visited Dr Ewing Schlein and reports having had an EGD which was normal.   For now, we will get notes from [facility].   We will start Carafate.  We will ask her to stop taking double dose of PPIs.   We will try to get her an appointment with GI.  If this does not help, we will consider asthma/COPD as a possible underlying reason for cough.  Patient to return back to clinic in 1-2 weeks if symptoms do not get better.  Patient examined with Dr Karma Greaser. Plan discussed with Dr Karma Greaser.  [name] MD MPH 12/25/2014 9:50 AM

## 2014-12-25 NOTE — Assessment & Plan Note (Addendum)
Sherri Mcmillan complains of 2 week history of cough today. She reports that the cough only bothers her at night and keeps her up at night. Has clear mucus production along with post-tussive emesis the last several nights. She sleeps propped up with pillows but still has symptoms. No dysphagia or odynophagia. Reports increased acid reflux symptoms the past 2 weeks. She has a history of GERD and Barrett's esophagus. Reports she was recently seen by Dr. Ewing Schlein 3 weeks ago with an EGD done at that time. She states she was told the EGD was normal and told to follow up for repeat in 3 years. Patient is a former smoker with a >30 pack year history though reports she quit smoking >15 years ago. She denies any SOB or wheezing.    - She is currently taking Nexium 20 mg bid as well as Protonix 40 mg daily. Will stop the Nexium today. Continue Protonix. - Will give Carafate. - Will try to get records from GI - Schedule f/u appointment with GI - If symptoms do not improve, consider asthma/COPD causing cough though symptoms most consistent with GERD.  - RTC in 1-2 weeks if no improvement

## 2015-01-08 ENCOUNTER — Ambulatory Visit (INDEPENDENT_AMBULATORY_CARE_PROVIDER_SITE_OTHER): Payer: Medicare HMO | Admitting: Internal Medicine

## 2015-01-08 ENCOUNTER — Encounter: Payer: Self-pay | Admitting: Internal Medicine

## 2015-01-08 ENCOUNTER — Encounter: Payer: Medicare HMO | Admitting: Internal Medicine

## 2015-01-08 VITALS — BP 135/68 | HR 63 | Temp 98.2°F | Wt 118.2 lb

## 2015-01-08 DIAGNOSIS — F419 Anxiety disorder, unspecified: Secondary | ICD-10-CM

## 2015-01-08 DIAGNOSIS — J441 Chronic obstructive pulmonary disease with (acute) exacerbation: Secondary | ICD-10-CM

## 2015-01-08 DIAGNOSIS — R05 Cough: Secondary | ICD-10-CM | POA: Diagnosis not present

## 2015-01-08 DIAGNOSIS — F418 Other specified anxiety disorders: Secondary | ICD-10-CM | POA: Diagnosis not present

## 2015-01-08 DIAGNOSIS — R911 Solitary pulmonary nodule: Secondary | ICD-10-CM | POA: Diagnosis not present

## 2015-01-08 DIAGNOSIS — R059 Cough, unspecified: Secondary | ICD-10-CM

## 2015-01-08 DIAGNOSIS — F329 Major depressive disorder, single episode, unspecified: Secondary | ICD-10-CM

## 2015-01-08 MED ORDER — CETIRIZINE HCL 10 MG PO TABS: 10.0000 mg | ORAL_TABLET | Freq: Every day | ORAL | Status: DC

## 2015-01-08 MED ORDER — LEVOFLOXACIN 500 MG PO TABS: 500.0000 mg | ORAL_TABLET | Freq: Every day | ORAL | Status: AC

## 2015-01-08 MED ORDER — ALPRAZOLAM 0.5 MG PO TABS: 0.5000 mg | ORAL_TABLET | Freq: Two times a day (BID) | ORAL | Status: DC | PRN

## 2015-01-08 MED ORDER — FLUTICASONE-SALMETEROL 250-50 MCG/DOSE IN AEPB: 1.0000 | INHALATION_SPRAY | Freq: Two times a day (BID) | RESPIRATORY_TRACT | Status: DC

## 2015-01-08 MED ORDER — ACETYLCYSTEINE 10 % IN SOLN: 4.0000 mL | Freq: Three times a day (TID) | RESPIRATORY_TRACT | Status: DC

## 2015-01-08 NOTE — Assessment & Plan Note (Signed)
Please see discussion under "Cough."

## 2015-01-08 NOTE — Assessment & Plan Note (Signed)
Patient with anxiety and distress due to cough. Has frustration that medications are not alleviating her symptoms and she is unable to sleep at night.  -will give short course of xanax 0.5 mg bid prn #20

## 2015-01-08 NOTE — Progress Notes (Signed)
81 year old female with long time GERD & Barretts, complaining of heartburn and cough at night which keeps her propped up. No dysphagia or odynophagia. She is also a former >30 pack year smoker, and might have asthma/COPD although she does not endorse shortness of breath and wheezing. She has changed multiple GI physicians and has recently visited Dr Ewing Schlein and had EGD - report scanned in media tab - Hiatal Hernia with Baretts. Dr Ewing Schlein thinks the cough is not due to GERD.  Exam does not reveal wheezing, few rhonchi heard, no bronchial sounds or crackles. No pedal edema or JVD. Patient appears very anxious, intermittently crying. At this moment I will consider lung pathology. There is a CT from last year that reveals nodular opacities and hints at bronchiectasis. It is curious to me that she has no day time pathology. DDx - COPD/asthma, chronic bronchitis, bronchiectasis, carcinoma, allergic. Plan:  Past history of lung nodule, question of bronchiectasis, would get urgent HRCT chest.  Schedule for urgent PFTs  Pulmonology referral   Empiric treatment for bronchiectasis/chronic bronchitis - levofloxacin 500 mg daily for 7 days.  Acetylcysteine inhalation  Schedule for chest physiotherapy with RT  Advair low dose to start with albuterol.   Xanax 0.5 mg twice daily as needed for anxiety, short course.  I would also consider zyrtec 10 mg daily for her.   Patient will come back in 2 weeks for follow up or as needed. Patient seen and examined with Dr Terrilee Croak. Plan discussed with Dr Terrilee Croak. [name] MD MPH 01/08/2015 2:55 PM

## 2015-01-08 NOTE — Assessment & Plan Note (Addendum)
Patient with complaint fo 3 weeks history of nightly coughing episodes which last for about 3 hours that keeps her up at night. She endorse mucus production that is white or light green in color with post-tussive emesis several times that is watery with a light pink color. Her appetite is decreased and she also complains of heartburn. She sleeps with her head elevated at night, but continues to have symptoms. She has feeling of choking sensation as well. She is a former smoker, reporting 0.5 PPD starting at age 24 until quitting about 20 years ago. She has tried mucinex, sucralfate, and robitussin without relief. She says she is compliant with her protonix 40 mg BID. She has tried her albuterol inhaler without relief.  She was admitted to hospital 08/2014 for COPD exacerbation according to chart review, although she was not told of any history of COPD or lung disease. CXR during that time showed changes consistent with mild COPD and chronic bronchitis as well as mild-mod hiatal hernia. She was to be referred to pulmonology and for PFTs but there is no documentation that this was done and patient denies as well.  Patient denies any previous episode similar to this, though chart review shows similar symptoms documented on 01/2014 thought to be related to acute bronchitis. CT chest from 01/2014 showed nodular opacities of the lung right upper lobe and slight bibasilar atelectasis as well as slight lower lobe bronchiectasis bilaterally.   She has history of GERD and Barrett's esophagus, s/p Nissan fundoplication in 04/2013 and had EGD done on 11/28/14 by Dr. Ewing Schlein that showed a large hiatal hernia in addition to Barrett's. Biopsy also consistent with Barrett's, no evidence of malignancy. She continues to follow with Dr. Ewing Schlein and says she spoke to him this morning and he did not think her cough was related to GERD.  Her symptoms are concerning for COPD/asthma vs bronchitis vs bronchiectasis. She does not endorse  daytime symptoms, no worsening of breathing or wheezing, no fevers. -Order PFTs -Referral to pulmonology -Referral for chest physiotherapy -Start Advair -Start Zyrtec 10 mg daily -Start Acetylcysteine 10% inhaler solution -Empiric Levofloxacin 500 mg po daily for 7 days -Chest CT high contrast -Follow up in 2 weeks or sooner should symptoms worsen

## 2015-01-08 NOTE — Patient Instructions (Signed)
I am sorry you have been feeling unwell Sherri Mcmillan.  I have sent a prescription for an inhaler called Advair to your pharmacy. You can use this twice a day.   I have also sent a prescription for Xanax 0.5 mg that you can take twice a day for anxiety.  I have ordered tests to check your lung function as well as a CT scan that it is important for you to have done.  I have sent a referral to a lung specialist as well.  Please follow up with [place] in 2 weeks.

## 2015-01-08 NOTE — Progress Notes (Signed)
Patient ID: Sherri Mcmillan, female   DOB: Mar 13, 1933, 81 y.o.   MRN: [id number]   Subjective:   Patient ID: Sherri Mcmillan female   DOB: 1933/01/03 81 y.o.   MRN: [id number]  HPI: [name] is a 81 y.o. female with PMH as listed below who returns to clinic after 2 weeks with unrelenting nighttime cough.  Please see problem list for details.   Past Medical History  Diagnosis Date  . Chronic pain     DJD knees, back, migranes, LLE varicose veins, and LLE neuropathy.   . Aortic stenosis     Dx ECHO 2008 , mild and aymptomatic , needs ECHO q 3-5 yrs  . Barrett's esophagus     Demonstrated on EGD 12/2010. EGD 09/17/2012 shows inflamed GE junctional mucosa without metaplasia, dysplasia, or malignancy.  . Chronic insomnia   . Chronic anxiety     Admission to [facility] (90s or early 2000)  for 6 weeks after mother died. Complicated again by death of her sister 2010. 2012 developed hallucinations and I refused to refill controlled meds unless she see psych which she is not agreable to  . Elevated cholesterol 10/11    LDL 155. Her 10 year risk (decreasing her age to 51 as the calculator won't go to age 43) is 21% ish. If consider her non smoker (which I wouldn't even though she says 1 pak lasts 2 weeks) risk is 12% ish. So goal for LDL is 130 or 100.  Marland Kitchen HTN (hypertension)     Controlled with 2 drug therapy  . Cataract   . Depression   . Gastroparesis     Demonstrated on GES 12/2010 by Dr Dalene Seltzer  . Bronchitis     hx of  . Left leg DVT 09/28/2012  . Anemia 12/14/2012  . GERD (gastroesophageal reflux disease)   . H/O hiatal hernia   . Migraine     sometimes  . DVT (deep venous thrombosis)    Current Outpatient Prescriptions  Medication Sig Dispense Refill  . albuterol (PROVENTIL HFA;VENTOLIN HFA) 108 (90 BASE) MCG/ACT inhaler Inhale 2 puffs into the lungs every 2 (two) hours as needed for wheezing or shortness of breath (cough). 1 Inhaler 1  . amLODipine (NORVASC) 10 MG tablet TAKE 1 TABLET  (10 MG TOTAL) BY MOUTH DAILY WITH BREAKFAST. 30 tablet 3  . citalopram (CELEXA) 20 MG tablet Take 1 tablet (20 mg total) by mouth daily. 30 tablet 3  . dorzolamide-timolol (COSOPT) 22.3-6.8 MG/ML ophthalmic solution Place 1 drop into the left eye 2 (two) times daily.    . furosemide (LASIX) 40 MG tablet Take 1 tablet (40 mg total) by mouth daily. 30 tablet 1  . latanoprost (XALATAN) 0.005 % ophthalmic solution Place 1 drop into the left eye at bedtime.    . pantoprazole (PROTONIX) 40 MG tablet Take 40 mg by mouth 2 (two) times daily.  12  . sucralfate (CARAFATE) 1 G tablet Take 1 tablet (1 g total) by mouth 4 (four) times daily -  with meals and at bedtime. 90 tablet 0  . acetaminophen (TYLENOL) 500 MG tablet Take 1,000-1,500 mg by mouth every 6 (six) hours as needed for mild pain, moderate pain or headache.    . alendronate (FOSAMAX) 70 MG tablet Take 1 tablet (70 mg total) by mouth once a week. Take with a full glass of water on an empty stomach. (Patient not taking: Reported on 11/13/2014) 4 tablet 5  . ALPRAZolam (XANAX) 0.5 MG tablet Take 1 tablet (  0.5 mg total) by mouth 2 (two) times daily as needed for anxiety or sleep. 20 tablet 0  . calcium-vitamin D (OSCAL WITH D) 500-200 MG-UNIT per tablet Take 1 tablet by mouth 2 (two) times daily. (Patient not taking: Reported on 10/09/2014) 30 tablet 0  . Fluticasone-Salmeterol (ADVAIR DISKUS) 250-50 MCG/DOSE AEPB Inhale 1 puff into the lungs 2 (two) times daily. 14 each 2  . traMADol (ULTRAM) 50 MG tablet Take 1 tablet (50 mg total) by mouth every 6 (six) hours as needed for moderate pain or severe pain. (Patient not taking: Reported on 01/08/2015) 20 tablet 0  . [DISCONTINUED] Calcium Carb-Cholecalciferol 600-800 MG-UNIT TABS Take 600-800 mg by mouth 2 (two) times daily. (Patient not taking: Reported on 09/02/2014) 60 tablet 2  . [DISCONTINUED] propranolol (INDERAL) 40 MG tablet Take 1 tablet (40 mg total) by mouth 2 (two) times daily. (Patient not taking:  Reported on 09/02/2014) 60 tablet 1   No current facility-administered medications for this visit.   Family History  Problem Relation Age of Onset  . Stroke Neg Hx   . Cancer Neg Hx   . Colon cancer Neg Hx   . Anesthesia problems Neg Hx   . Hypotension Neg Hx   . Malignant hyperthermia Neg Hx   . Pseudochol deficiency Neg Hx   . Heart disease Mother   . Hypertension Mother   . Heart attack Mother   . Hypertension Father   . Heart attack Father   . Diabetes Brother    Social History   Social History  . Marital Status: Single    Spouse Name: N/A  . Number of Children: N/A  . Years of Education: N/A   Social History Main Topics  . Smoking status: Former Smoker -- 50 years    Types: Cigarettes    Quit date: 06/01/2010  . Smokeless tobacco: Never Used  . Alcohol Use: No     Comment: quit 24 years ago  . Drug Use: No  . Sexual Activity: Not Asked   Other Topics Concern  . 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 Negative for fever.  HENT: Positive for sore throat.   Respiratory: Positive for cough, hemoptysis and sputum production. Negative for wheezing.   Cardiovascular: Negative for chest pain, palpitations and orthopnea.  Gastrointestinal: Positive for heartburn and vomiting. Negative for nausea, abdominal pain, diarrhea and constipation.  Genitourinary: Negative for dysuria, urgency and frequency.  Musculoskeletal: Negative for myalgias and joint pain.    Objective:  Physical Exam: Filed Vitals:   01/08/15 1333  BP: 135/68  Pulse: 63  Temp: 98.2 F (36.8 C)  TempSrc:  Oral  Weight: 118 lb 3.2 oz (53.615 kg)  SpO2: 100%   Physical Exam  Constitutional: She is oriented to person, place, and time.  Elderly woman, NAD, frustrated  HENT:  Head: Normocephalic and atraumatic.  Cardiovascular: Normal rate and regular rhythm.   Murmur heard.  Systolic murmur is present  Pulmonary/Chest: Effort normal. No respiratory distress. She has no wheezes.  Rhonchi heard throughout  Abdominal: Soft. There is no tenderness.  Musculoskeletal: Normal range of motion. She exhibits no edema or tenderness.  Neurological: She is alert and oriented to person, place, and time.  Skin: Skin is warm.    Assessment & Plan:  Please see problem based charting.

## 2015-01-08 NOTE — Assessment & Plan Note (Signed)
Patient due for repeat CT follow up for nodular opacities seen on imaging from 02/02/14. She has history of 20+ pack smoking years. -CT chest

## 2015-01-13 ENCOUNTER — Telehealth: Payer: Self-pay | Admitting: *Deleted

## 2015-01-13 NOTE — Telephone Encounter (Signed)
Per dr Dalphine Handing called pt's ph#'s, no answers or vmail was full, left 1 message. Will call again in the am

## 2015-01-14 NOTE — Addendum Note (Signed)
Addended by: Maura Crandall on: 01/14/2015 11:18 AM   Modules accepted: Orders

## 2015-01-16 ENCOUNTER — Other Ambulatory Visit: Payer: Self-pay | Admitting: Internal Medicine

## 2015-01-16 ENCOUNTER — Other Ambulatory Visit (HOSPITAL_COMMUNITY): Payer: Self-pay | Admitting: Respiratory Therapy

## 2015-01-17 NOTE — Telephone Encounter (Signed)
i have tried to call the pt 3 times, no answer

## 2015-01-17 NOTE — Telephone Encounter (Signed)
This was for a resp infxn. IF not bettere, needs to be seen. Will not refill.

## 2015-01-20 ENCOUNTER — Encounter: Payer: Self-pay | Admitting: *Deleted

## 2015-01-21 ENCOUNTER — Ambulatory Visit (HOSPITAL_COMMUNITY)
Admission: RE | Admit: 2015-01-21 | Discharge: 2015-01-21 | Disposition: A | Discharge status: Home / Self Care | Payer: Medicare HMO | Source: Ambulatory Visit | Attending: Internal Medicine | Admitting: Internal Medicine

## 2015-01-21 DIAGNOSIS — R911 Solitary pulmonary nodule: Secondary | ICD-10-CM | POA: Diagnosis not present

## 2015-01-21 DIAGNOSIS — R059 Cough, unspecified: Secondary | ICD-10-CM

## 2015-01-21 DIAGNOSIS — R05 Cough: Secondary | ICD-10-CM | POA: Insufficient documentation

## 2015-01-21 DIAGNOSIS — J441 Chronic obstructive pulmonary disease with (acute) exacerbation: Secondary | ICD-10-CM

## 2015-01-21 LAB — PULMONARY FUNCTION TEST
DL/VA % pred: 83 %
DL/VA: 3.41 ml/min/mmHg/L
DLCO unc % pred: 63 %
DLCO unc: 11.2 ml/min/mmHg
FEF 25-75 PRE: 0.45 L/s
FEF 25-75 Post: 0.21 L/sec
FEF2575-%CHANGE-POST: -53 %
FEF2575-%PRED-POST: 22 %
FEF2575-%Pred-Pre: 47 %
FEV1-%CHANGE-POST: -18 %
FEV1-%PRED-POST: 80 %
FEV1-%PRED-PRE: 98 %
FEV1-POST: 0.9 L
FEV1-PRE: 1.11 L
FEV1FVC-%CHANGE-POST: 0 %
FEV1FVC-%Pred-Pre: 90 %
FEV6-%Change-Post: -19 %
FEV6-%PRED-PRE: 116 %
FEV6-%Pred-Post: 93 %
FEV6-PRE: 1.61 L
FEV6-Post: 1.3 L
FEV6FVC-%Change-Post: 0 %
FEV6FVC-%PRED-PRE: 103 %
FEV6FVC-%Pred-Post: 103 %
FVC-%Change-Post: -17 %
FVC-%PRED-POST: 91 %
FVC-%Pred-Pre: 111 %
FVC-POST: 1.34 L
FVC-Pre: 1.63 L
POST FEV6/FVC RATIO: 98 %
PRE FEV6/FVC RATIO: 99 %
Post FEV1/FVC ratio: 67 %
Pre FEV1/FVC ratio: 68 %
RV % PRED: 104 %
RV: 2.25 L
TLC % pred: 102 %
TLC: 4.39 L

## 2015-01-21 MED ORDER — ALBUTEROL SULFATE (2.5 MG/3ML) 0.083% IN NEBU
2.5000 mg | INHALATION_SOLUTION | Freq: Once | RESPIRATORY_TRACT | Status: AC
  Administered 2015-01-21: 2.5 mg via RESPIRATORY_TRACT

## 2015-01-29 ENCOUNTER — Encounter: Payer: Medicare HMO | Admitting: Internal Medicine

## 2015-01-30 ENCOUNTER — Encounter: Payer: Self-pay | Admitting: Internal Medicine

## 2015-02-05 ENCOUNTER — Other Ambulatory Visit: Payer: Self-pay | Admitting: Internal Medicine

## 2015-02-06 ENCOUNTER — Ambulatory Visit (HOSPITAL_COMMUNITY): Admission: RE | Admit: 2015-02-06 | Payer: Medicare HMO | Source: Ambulatory Visit

## 2015-02-12 NOTE — Telephone Encounter (Signed)
Have called pt 3 times, no answer

## 2015-03-05 NOTE — Addendum Note (Signed)
Addended by: Neomia DearPOWERS,  E on: 03/05/2015 06:20 PM   Modules accepted: Orders

## 2015-03-17 ENCOUNTER — Other Ambulatory Visit: Payer: Self-pay | Admitting: Internal Medicine

## 2015-03-24 ENCOUNTER — Encounter: Payer: Self-pay | Admitting: *Deleted

## 2015-03-29 ENCOUNTER — Other Ambulatory Visit: Payer: Self-pay | Admitting: Internal Medicine

## 2015-03-31 NOTE — Telephone Encounter (Signed)
I don't see any other requests.  I will have her scheduled

## 2015-04-03 ENCOUNTER — Other Ambulatory Visit: Payer: Self-pay | Admitting: Internal Medicine

## 2015-04-03 MED ORDER — AMLODIPINE BESYLATE 10 MG PO TABS: ORAL_TABLET | ORAL | Status: DC

## 2015-06-10 ENCOUNTER — Telehealth: Payer: Self-pay | Admitting: Internal Medicine

## 2015-06-10 NOTE — Telephone Encounter (Signed)
APPT REMINDER CALL/LMTCB IF SHE NEEDS TO CANCEL °

## 2015-06-11 ENCOUNTER — Ambulatory Visit: Payer: Medicare HMO | Admitting: Internal Medicine

## 2015-06-11 ENCOUNTER — Encounter: Payer: Self-pay | Admitting: Internal Medicine

## 2015-07-03 ENCOUNTER — Encounter: Payer: Self-pay | Admitting: Internal Medicine

## 2015-07-03 ENCOUNTER — Ambulatory Visit (INDEPENDENT_AMBULATORY_CARE_PROVIDER_SITE_OTHER): Payer: Commercial Managed Care - HMO | Admitting: Internal Medicine

## 2015-07-03 VITALS — BP 140/74 | HR 71 | Temp 98.0°F | Ht 60.0 in | Wt 121.3 lb

## 2015-07-03 DIAGNOSIS — J309 Allergic rhinitis, unspecified: Secondary | ICD-10-CM | POA: Diagnosis not present

## 2015-07-03 DIAGNOSIS — R05 Cough: Secondary | ICD-10-CM | POA: Diagnosis not present

## 2015-07-03 DIAGNOSIS — R059 Cough, unspecified: Secondary | ICD-10-CM

## 2015-07-03 DIAGNOSIS — R911 Solitary pulmonary nodule: Secondary | ICD-10-CM

## 2015-07-03 MED ORDER — CETIRIZINE HCL 10 MG PO TABS: 10.0000 mg | ORAL_TABLET | Freq: Every day | ORAL | Status: DC

## 2015-07-03 NOTE — Assessment & Plan Note (Signed)
A: Chronic cough  P: -She is on BID PPI for GERD - She takes Advair BID for COPD (do not suspect cough variant asthma) - She is at risk for lung CA and has had an abnormal CT previously, will follow up with CT of the chest today - She does have evidence of allergic rhinitis which i will treat.

## 2015-07-03 NOTE — Assessment & Plan Note (Signed)
A: Allergic rhinitis, this may be causing an Upper airway cough syndrome  P: Start Zyrtec 10mg  daily - May add intranasal steroids OTC

## 2015-07-03 NOTE — Patient Instructions (Signed)
General Instructions:  I want you to get the CT of the chest that was ordered.  I want you to start back on Zyrtec you can also use a nasal spray like Nasacort or Flonase  Please bring your medicines with you each time you come to clinic.  Medicines may include prescription medications, over-the-counter medications, herbal remedies, eye drops, vitamins, or other pills.   Progress Toward Treatment Goals:  Treatment Goal 04/10/2014  Blood pressure at goal    Self Care Goals & Plans:  Self Care Goal 07/03/2015  Manage my medications take my medicines as prescribed; bring my medications to every visit; refill my medications on time  Eat healthy foods eat more vegetables; eat foods that are low in salt; eat baked foods instead of fried foods  Be physically active -    No flowsheet data found.   Care Management & Community Referrals:  Referral 07/03/2015  Referrals made for care management support -  [facility]

## 2015-07-03 NOTE — Progress Notes (Signed)
[name] Subjective:   Patient ID: Sherri Mcmillan female   DOB: 1933-02-12 82 y.o.   MRN: [id number]  HPI: Sherri Mcmillan is a 82 y.o. female with a PMH detailed below who presents for evaluation of chronic cough.  She was previously evaluated on multiple occasions about 6 months ago but appears to have not kept her follow up appointments.  She returns today with her pastor who is coming along to encourage her to follow through and understand what is happening with her cough.   She reports her cough is predominately at night.  It is occasionally productive.  It occasionally gets her choked up to the point she needs to drink a glass of water.  She admits it was present back last September but got better and then has been bad again for the last 1 month.  She cannot remember why it got better.  She does not remember if she took the Levaquin antibiotic that was prescribed.  She is not taking zyrtec or any nasal spray.  She did get her PFTs completed however do to inconsistent effort during this it is very difficult to interpret the results with a great deal of confidence.  She did not get the HRCT of the chest that was ordered.    Past Medical History  Diagnosis Date  . Chronic pain     DJD knees, back, migranes, LLE varicose veins, and LLE neuropathy.   . Aortic stenosis     Dx ECHO 2008 , mild and aymptomatic , needs ECHO q 3-5 yrs  . Barrett's esophagus     Demonstrated on EGD 12/2010. EGD 09/17/2012 shows inflamed GE junctional mucosa without metaplasia, dysplasia, or malignancy.  . Chronic insomnia   . Chronic anxiety     Admission to [facility] (90s or early 2000)  for 6 weeks after mother died. Complicated again by death of her sister 2010. 2012 developed hallucinations and I refused to refill controlled meds unless she see psych which she is not agreable to  . Elevated cholesterol 10/11    LDL 155. Her 10 year risk (decreasing her age to 59 as the calculator won't go  to age 31) is 21% ish. If consider her non smoker (which I wouldn't even though she says 1 pak lasts 2 weeks) risk is 12% ish. So goal for LDL is 130 or 100.  Marland Kitchen HTN (hypertension)     Controlled with 2 drug therapy  . Cataract   . Depression   . Gastroparesis     Demonstrated on GES 12/2010 by Dr Dalene Seltzer  . Bronchitis     hx of  . Left leg DVT (HCC) 09/28/2012  . Anemia 12/14/2012  . GERD (gastroesophageal reflux disease)   . H/O hiatal hernia   . Migraine     sometimes  . DVT (deep venous thrombosis) (HCC)    Current Outpatient Prescriptions  Medication Sig Dispense Refill  . amLODipine (NORVASC) 10 MG tablet TAKE 1 TABLET (10 MG TOTAL) BY MOUTH DAILY WITH BREAKFAST. 30 tablet 3  . citalopram (CELEXA) 20 MG tablet TAKE 1 TABLET (20 MG TOTAL) BY MOUTH DAILY. 30 tablet 3  . esomeprazole (NEXIUM) 20 MG capsule Take 20 mg by mouth daily at 12 noon.    . Fluticasone-Salmeterol (ADVAIR DISKUS) 250-50 MCG/DOSE AEPB Inhale 1 puff into the lungs 2 (two) times daily. 14 each 2  . furosemide (LASIX) 40 MG tablet Take 1 tablet (40 mg total) by mouth daily. 30 tablet 1  .  acetaminophen (TYLENOL) 500 MG tablet Take 1,000-1,500 mg by mouth every 6 (six) hours as needed for mild pain, moderate pain or headache.    Marland Kitchen. acetylcysteine (MUCOMYST) 10 % nebulizer solution Take 4 mLs by nebulization 3 (three) times daily. 30 mL 12  . albuterol (PROVENTIL HFA;VENTOLIN HFA) 108 (90 BASE) MCG/ACT inhaler Inhale 2 puffs into the lungs every 2 (two) hours as needed for wheezing or shortness of breath (cough). 1 Inhaler 1  . alendronate (FOSAMAX) 70 MG tablet Take 1 tablet (70 mg total) by mouth once a week. Take with a full glass of water on an empty stomach. (Patient not taking: Reported on 11/13/2014) 4 tablet 5  . ALPRAZolam (XANAX) 0.5 MG tablet Take 1 tablet (0.5 mg total) by mouth 2 (two) times daily as needed for anxiety or sleep. (Patient not taking: Reported on 07/03/2015) 20 tablet 0  . calcium-vitamin D (OSCAL  WITH D) 500-200 MG-UNIT per tablet Take 1 tablet by mouth 2 (two) times daily. (Patient not taking: Reported on 10/09/2014) 30 tablet 0  . cetirizine (ZYRTEC ALLERGY) 10 MG tablet Take 1 tablet (10 mg total) by mouth daily. (Patient not taking: Reported on 07/03/2015) 30 tablet 0  . dorzolamide-timolol (COSOPT) 22.3-6.8 MG/ML ophthalmic solution Place 1 drop into the left eye 2 (two) times daily. Reported on 07/03/2015    . latanoprost (XALATAN) 0.005 % ophthalmic solution Place 1 drop into the left eye at bedtime. Reported on 07/03/2015    . pantoprazole (PROTONIX) 40 MG tablet Take 40 mg by mouth 2 (two) times daily. Reported on 07/03/2015  12  . sucralfate (CARAFATE) 1 G tablet Take 1 tablet (1 g total) by mouth 4 (four) times daily -  with meals and at bedtime. (Patient not taking: Reported on 07/03/2015) 90 tablet 0  . [DISCONTINUED] Calcium Carb-Cholecalciferol 600-800 MG-UNIT TABS Take 600-800 mg by mouth 2 (two) times daily. (Patient not taking: Reported on 09/02/2014) 60 tablet 2  . [DISCONTINUED] propranolol (INDERAL) 40 MG tablet Take 1 tablet (40 mg total) by mouth 2 (two) times daily. (Patient not taking: Reported on 09/02/2014) 60 tablet 1   No current facility-administered medications for this visit.   Family History  Problem Relation Age of Onset  . Stroke Neg Hx   . Cancer Neg Hx   . Colon cancer Neg Hx   . Anesthesia problems Neg Hx   . Hypotension Neg Hx   . Malignant hyperthermia Neg Hx   . Pseudochol deficiency Neg Hx   . Heart disease Mother   . Hypertension Mother   . Heart attack Mother   . Hypertension Father   . Heart attack Father   . Diabetes Brother    Social History   Social History  . Marital Status: Single    Spouse Name: N/A  . Number of Children: N/A  . Years of Education: N/A   Social History Main Topics  . Smoking status: Former Smoker -- 50 years    Types: Cigarettes    Quit date: 06/01/2010  . Smokeless tobacco: Never Used  . Alcohol Use: No      Comment: quit 24 years ago  . Drug Use: No  . Sexual Activity: Not Asked   Other Topics Concern  . 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 Negative for fever, chills and weight loss.  Eyes: Negative for blurred vision.  Respiratory: Positive for cough, sputum production and shortness of breath. Negative for hemoptysis and wheezing.   Cardiovascular: Negative for chest pain.  Gastrointestinal: Negative for heartburn.  Genitourinary: Negative for dysuria.  Musculoskeletal: Negative for myalgias.  Neurological: Negative for dizziness and headaches.     Objective:  Physical Exam: Filed Vitals:   07/03/15 1606  BP: 140/74  Pulse: 71  Temp: 98 F (36.7 C)  TempSrc: Oral  Height: 5' (1.524 m)  Weight: 121 lb 4.8 oz (55.021 kg)  SpO2: 100%  Physical Exam  Constitutional: She is oriented to person, place, and time and well-developed, well-nourished, and in no distress.  HENT:  Nose: Mucosal edema present. No rhinorrhea.  Inferior turbinate hypertrophy bilaterally  Eyes: Conjunctivae are normal.  Cardiovascular: Normal rate and regular rhythm.   Pulmonary/Chest: Effort normal and breath sounds normal.  High pitched upper airway sounds, lungs however or clear to ascultation bilaterally without wheezing.  Musculoskeletal: She exhibits no edema.  Neurological: She is alert and oriented to person, place, and time.  Nursing note and vitals reviewed.   Assessment & Plan:  Case discussed with Dr. Oswaldo Done  Allergic rhinitis A: Allergic rhinitis, this may be causing an Upper airway cough  syndrome  P: Start Zyrtec  daily - May add intranasal steroids OTC  Pulmonary nodule A: Pulmonary nodules 3-32mm in size  P: Needs follow up, the HRCT is outdated. I do not feel she needs the high resolution so will follow up with a standard non contrast CT of the chest.  Cough A: Chronic cough  P: -She is on BID PPI for GERD - She takes Advair BID for COPD (do not suspect cough variant asthma) - She is at risk for lung CA and has had an abnormal CT previously, will follow up with CT of the chest today - She does have evidence of allergic rhinitis which i will treat.     Medications Ordered Meds ordered this encounter  Medications  . esomeprazole (NEXIUM) 20 MG capsule    Sig: Take 20 mg by mouth daily at 12 noon.  . cetirizine (ZYRTEC ALLERGY) 10 MG tablet    Sig: Take 1 tablet (10 mg total) by mouth daily.    Dispense:  30 tablet    Refill:  11   Other Orders Orders Placed This Encounter  Procedures  . CT Chest Wo Contrast    Standing Status: Future     Number of Occurrences:      Standing Expiration Date: 09/01/2016    Order Specific Question:  Reason for Exam (SYMPTOM  OR DIAGNOSIS REQUIRED)    Answer:  chronic cough, follow up lung nodules from 01/2014    Order Specific Question:  Preferred imaging location?    Answer:  [facility]   Follow Up: Return in about 3 weeks (around 07/24/2015).

## 2015-07-03 NOTE — Assessment & Plan Note (Signed)
A: Pulmonary nodules 3-265mm in size  P: Needs follow up, the HRCT is outdated. I do not feel she needs the high resolution so will follow up with a standard non contrast CT of the chest.

## 2015-07-04 NOTE — Progress Notes (Signed)
Internal Medicine Clinic Attending  Case discussed with Dr. Hoffman at the time of the visit.  We reviewed the resident's history and exam and pertinent patient test results.  I agree with the assessment, diagnosis, and plan of care documented in the resident's note.  

## 2015-07-08 ENCOUNTER — Ambulatory Visit (HOSPITAL_COMMUNITY)
Admission: RE | Admit: 2015-07-08 | Discharge: 2015-07-08 | Disposition: A | Discharge status: Home / Self Care | Payer: Commercial Managed Care - HMO | Source: Ambulatory Visit | Attending: Student in an Organized Health Care Education/Training Program | Admitting: Student in an Organized Health Care Education/Training Program

## 2015-07-08 DIAGNOSIS — R05 Cough: Secondary | ICD-10-CM | POA: Diagnosis not present

## 2015-07-08 DIAGNOSIS — K449 Diaphragmatic hernia without obstruction or gangrene: Secondary | ICD-10-CM | POA: Insufficient documentation

## 2015-07-08 DIAGNOSIS — J984 Other disorders of lung: Secondary | ICD-10-CM | POA: Insufficient documentation

## 2015-07-08 DIAGNOSIS — K8020 Calculus of gallbladder without cholecystitis without obstruction: Secondary | ICD-10-CM | POA: Insufficient documentation

## 2015-07-08 DIAGNOSIS — R911 Solitary pulmonary nodule: Secondary | ICD-10-CM | POA: Diagnosis not present

## 2015-07-08 DIAGNOSIS — R918 Other nonspecific abnormal finding of lung field: Secondary | ICD-10-CM | POA: Diagnosis not present

## 2015-07-08 DIAGNOSIS — R059 Cough, unspecified: Secondary | ICD-10-CM

## 2015-08-07 ENCOUNTER — Other Ambulatory Visit: Payer: Self-pay | Admitting: Internal Medicine

## 2015-08-14 ENCOUNTER — Ambulatory Visit (INDEPENDENT_AMBULATORY_CARE_PROVIDER_SITE_OTHER): Payer: Commercial Managed Care - HMO | Admitting: Internal Medicine

## 2015-08-14 ENCOUNTER — Encounter: Payer: Self-pay | Admitting: Internal Medicine

## 2015-08-14 DIAGNOSIS — J42 Unspecified chronic bronchitis: Secondary | ICD-10-CM | POA: Diagnosis not present

## 2015-08-14 MED ORDER — ALBUTEROL SULFATE HFA 108 (90 BASE) MCG/ACT IN AERS: 2.0000 | INHALATION_SPRAY | RESPIRATORY_TRACT | Status: DC | PRN

## 2015-08-14 MED ORDER — DIPHENHYDRAMINE HCL 25 MG PO TABS: 50.0000 mg | ORAL_TABLET | Freq: Every evening | ORAL | Status: DC | PRN

## 2015-08-14 MED ORDER — FUROSEMIDE 40 MG PO TABS: 40.0000 mg | ORAL_TABLET | Freq: Every day | ORAL | Status: DC

## 2015-08-14 MED ORDER — CHLORPHENIRAMINE MALEATE 4 MG PO TABS: 4.0000 mg | ORAL_TABLET | ORAL | Status: DC | PRN

## 2015-08-14 NOTE — Progress Notes (Signed)
Subjective:   Patient ID: Sherri Mcmillan female   DOB: December 11, 1932 82 y.o.   MRN: [id number]  HPI: [name] is a [age] y.o. with past medical history as outlined below who presents to clinic for chronic cough f/u.   Please see problem list for status of the pt's chronic medical problems.  Past Medical History  Diagnosis Date  . Chronic pain     DJD knees, back, migranes, LLE varicose veins, and LLE neuropathy.   . Aortic stenosis     Dx ECHO 2008 , mild and aymptomatic , needs ECHO q 3-5 yrs  . Barrett's esophagus     Demonstrated on EGD 12/2010. EGD 09/17/2012 shows inflamed GE junctional mucosa without metaplasia, dysplasia, or malignancy.  . Chronic insomnia   . Chronic anxiety     Admission to [facility] (90s or early 2000)  for 6 weeks after mother died. Complicated again by death of her sister 2010. 2012 developed hallucinations and I refused to refill controlled meds unless she see psych which she is not agreable to  . Elevated cholesterol 10/11    LDL 155. Her 10 year risk (decreasing her age to [age] as the calculator won't go to age 82) is 21% ish. If consider her non smoker (which I wouldn't even though she says 1 pak lasts 2 weeks) risk is 12% ish. So goal for LDL is 130 or 100.  Sherri Mcmillan. HTN (hypertension)     Controlled with 2 drug therapy  . Cataract   . Depression   . Gastroparesis     Demonstrated on GES 12/2010 by Dr Dalene SeltzerKapland  . Bronchitis     hx of  . Left leg DVT (HCC) 09/28/2012  . Anemia 12/14/2012  . GERD (gastroesophageal reflux disease)   . H/O hiatal hernia   . Migraine     sometimes  . DVT (deep venous thrombosis) (HCC)    Current Outpatient Prescriptions  Medication Sig Dispense Refill  . acetaminophen (TYLENOL) 500 MG tablet Take 1,000-1,500 mg by mouth every 6 (six) hours as needed for mild pain, moderate pain or headache.    Sherri Mcmillan. acetylcysteine (MUCOMYST) 10 % nebulizer solution Take 4 mLs by nebulization 3 (three) times daily. 30 mL 12  . albuterol (PROVENTIL  HFA;VENTOLIN HFA) 108 (90 Base) MCG/ACT inhaler Inhale 2 puffs into the lungs every 2 (two) hours as needed for wheezing or shortness of breath (cough). 1 Inhaler 1  . alendronate (FOSAMAX) 70 MG tablet Take 1 tablet (70 mg total) by mouth once a week. Take with a full glass of water on an empty stomach. (Patient not taking: Reported on 11/13/2014) 4 tablet 5  . ALPRAZolam (XANAX) 0.5 MG tablet Take 1 tablet (0.5 mg total) by mouth 2 (two) times daily as needed for anxiety or sleep. (Patient not taking: Reported on 07/03/2015) 20 tablet 0  . amLODipine (NORVASC) 10 MG tablet TAKE 1 TABLET (10 MG TOTAL) BY MOUTH DAILY WITH BREAKFAST. 30 tablet 3  . calcium-vitamin D (OSCAL WITH D) 500-200 MG-UNIT per tablet Take 1 tablet by mouth 2 (two) times daily. (Patient not taking: Reported on 10/09/2014) 30 tablet 0  . cetirizine (ZYRTEC ALLERGY) 10 MG tablet Take 1 tablet (10 mg total) by mouth daily. 30 tablet 11  . citalopram (CELEXA) 20 MG tablet TAKE 1 TABLET (20 MG TOTAL) BY MOUTH DAILY. 30 tablet 3  . dorzolamide-timolol (COSOPT) 22.3-6.8 MG/ML ophthalmic solution Place 1 drop into the left eye 2 (two) times daily. Reported on 07/03/2015    .  esomeprazole (NEXIUM) 20 MG capsule Take 20 mg by mouth daily at 12 noon.    . Fluticasone-Salmeterol (ADVAIR DISKUS) 250-50 MCG/DOSE AEPB Inhale 1 puff into the lungs 2 (two) times daily. 14 each 2  . furosemide (LASIX) 40 MG tablet Take 1 tablet (40 mg total) by mouth daily. 30 tablet 0  . latanoprost (XALATAN) 0.005 % ophthalmic solution Place 1 drop into the left eye at bedtime. Reported on 07/03/2015    . sucralfate (CARAFATE) 1 G tablet Take 1 tablet (1 g total) by mouth 4 (four) times daily -  with meals and at bedtime. (Patient not taking: Reported on 07/03/2015) 90 tablet 0  . [DISCONTINUED] Calcium Carb-Cholecalciferol 600-800 MG-UNIT TABS Take 600-800 mg by mouth 2 (two) times daily. (Patient not taking: Reported on 09/02/2014) 60 tablet 2  . [DISCONTINUED] propranolol  (INDERAL) 40 MG tablet Take 1 tablet (40 mg total) by mouth 2 (two) times daily. (Patient not taking: Reported on 09/02/2014) 60 tablet 1   No current facility-administered medications for this visit.   Family History  Problem Relation Age of Onset  . Stroke Neg Hx   . Cancer Neg Hx   . Colon cancer Neg Hx   . Anesthesia problems Neg Hx   . Hypotension Neg Hx   . Malignant hyperthermia Neg Hx   . Pseudochol deficiency Neg Hx   . Heart disease Mother   . Hypertension Mother   . Heart attack Mother   . Hypertension Father   . Heart attack Father   . Diabetes Brother    Social History   Social History  . Marital Status: Single    Spouse Name: Sherri Mcmillan  . Number of Children: Sherri Mcmillan  . Years of Education: Sherri Mcmillan   Social History Main Topics  . Smoking status: Former Smoker -- 50 years    Types: Cigarettes    Quit date: 06/01/2010  . Smokeless tobacco: Never Used  . Alcohol Use: No     Comment: quit 24 years ago  . Drug Use: No  . Sexual Activity: Not Asked   Other Topics Concern  . 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  Respiratory: Positive for cough (chronic) and sputum production (difficult for pt to cough sputum up ). Negative for shortness of breath.   Gastrointestinal: Positive for heartburn. Negative for nausea, vomiting and abdominal pain.    Objective:  Physical Exam: Filed Vitals:   08/14/15 1404  BP: 128/68  Pulse: 82  Temp: 98.2 F (36.8 C)  TempSrc: Oral  Weight: 122 lb 3.2 oz (55.43 kg)  SpO2: 100%   Physical Exam  Constitutional: She appears  well-developed and well-nourished. No distress.  HENT:  Head: Normocephalic and atraumatic.  Nose: Nose normal.  Eyes: Conjunctivae and EOM are normal. No scleral icterus.  Cardiovascular: Normal rate, regular rhythm and normal heart sounds.  Exam reveals no gallop and no friction rub.   No murmur heard. Pulmonary/Chest: Effort normal and breath sounds normal. No respiratory distress. She has no wheezes.  She has no rales. She exhibits no tenderness.  Coarse crackles throughout   Abdominal: Soft. Bowel sounds are normal. She exhibits no distension and no mass. There is no tenderness. There is no rebound and no guarding.  Musculoskeletal: She exhibits edema (trace pedal edema).  Skin: Skin is warm and dry. No rash noted. She is not diaphoretic. No erythema. No pallor.    Assessment & Plan:   Please see problem based assessment and plan.

## 2015-08-14 NOTE — Assessment & Plan Note (Addendum)
Pt has a chronic cough and congestion which she takes daily zyrtec and benadryl for. Initially she states she takes benadryl 25mg  qhs and then later said she takes 100mg  qhs. She has coughing spells and states it will take several minutes for her to cough up sputum.   - instructed pt to stop her anti histamines, given rx for chlorpheniramine 4mg  q4h prn to help with congestion.   - f/u in June with her PCP Dr. Andrey CampanileWilson - continue nexium 20mg , can consider switching to different PPI at next visit if cough not improved

## 2015-08-14 NOTE — Patient Instructions (Addendum)
Start taking chlorpheniraime 4mg  every 4 hours as needed for congestion. This will make you drowsy so do not operate heavy machinery while taking this.   For leg swelling keep legs elevated or try compression stockings.

## 2015-08-18 NOTE — Progress Notes (Signed)
Medicine attending: Medical history, presenting problems, physical findings, and medications, reviewed with resident physician Dr Diana Truong on the day of the patient visit and I concur with her evaluation and management plan. 

## 2015-08-27 ENCOUNTER — Other Ambulatory Visit: Payer: Self-pay | Admitting: Internal Medicine

## 2015-08-27 NOTE — Telephone Encounter (Signed)
Lasix removed from med list 08/18/2015.

## 2015-08-29 NOTE — Telephone Encounter (Signed)
Verified with pharmacy that this was a patient request. Attempted to contact patient with no answer.

## 2015-09-29 ENCOUNTER — Other Ambulatory Visit: Payer: Self-pay | Admitting: *Deleted

## 2015-09-29 NOTE — Telephone Encounter (Signed)
Received faxed 90 day supply request from pt's pharmacy for her citalopram hbr 20mg  tabs-will send to pcp for review, please advise.Sherri Mcmillan,  [name]6/5/20171:50 PM

## 2015-09-29 NOTE — Telephone Encounter (Signed)
Also received 90 supply request on pt's amlodipine 10mg   Tabs.Kingsley SpittleGoldston,  [name]6/5/20172:37 PM

## 2015-10-02 ENCOUNTER — Other Ambulatory Visit: Payer: Self-pay

## 2015-10-02 NOTE — Telephone Encounter (Signed)
Mardella LaymanLindsey from [facility] pharmacy requesting amlodipine to be filled.

## 2015-10-03 ENCOUNTER — Encounter: Payer: Self-pay | Admitting: Internal Medicine

## 2015-10-03 ENCOUNTER — Ambulatory Visit (INDEPENDENT_AMBULATORY_CARE_PROVIDER_SITE_OTHER): Payer: Commercial Managed Care - HMO | Admitting: Internal Medicine

## 2015-10-03 VITALS — BP 183/92 | HR 78 | Temp 98.5°F | Ht 60.0 in | Wt 122.9 lb

## 2015-10-03 DIAGNOSIS — F329 Major depressive disorder, single episode, unspecified: Secondary | ICD-10-CM

## 2015-10-03 DIAGNOSIS — R059 Cough, unspecified: Secondary | ICD-10-CM

## 2015-10-03 DIAGNOSIS — R05 Cough: Secondary | ICD-10-CM | POA: Diagnosis not present

## 2015-10-03 DIAGNOSIS — K219 Gastro-esophageal reflux disease without esophagitis: Secondary | ICD-10-CM | POA: Diagnosis not present

## 2015-10-03 DIAGNOSIS — I350 Nonrheumatic aortic (valve) stenosis: Secondary | ICD-10-CM | POA: Diagnosis not present

## 2015-10-03 DIAGNOSIS — F418 Other specified anxiety disorders: Secondary | ICD-10-CM

## 2015-10-03 DIAGNOSIS — I10 Essential (primary) hypertension: Secondary | ICD-10-CM | POA: Diagnosis not present

## 2015-10-03 DIAGNOSIS — R911 Solitary pulmonary nodule: Secondary | ICD-10-CM

## 2015-10-03 DIAGNOSIS — M810 Age-related osteoporosis without current pathological fracture: Secondary | ICD-10-CM

## 2015-10-03 DIAGNOSIS — F419 Anxiety disorder, unspecified: Secondary | ICD-10-CM

## 2015-10-03 DIAGNOSIS — R918 Other nonspecific abnormal finding of lung field: Secondary | ICD-10-CM

## 2015-10-03 DIAGNOSIS — F32A Depression, unspecified: Secondary | ICD-10-CM

## 2015-10-03 MED ORDER — OMEPRAZOLE 20 MG PO CPDR: 20.0000 mg | DELAYED_RELEASE_CAPSULE | Freq: Two times a day (BID) | ORAL | Status: DC

## 2015-10-03 MED ORDER — AMLODIPINE BESYLATE 10 MG PO TABS
ORAL_TABLET | ORAL | Status: DC
Start: 2015-10-03 — End: 2016-03-31

## 2015-10-03 MED ORDER — CITALOPRAM HYDROBROMIDE 20 MG PO TABS: ORAL_TABLET | ORAL | Status: DC

## 2015-10-03 MED ORDER — ZOLEDRONIC ACID 4 MG/100ML IV SOLN: INTRAVENOUS | Status: DC

## 2015-10-03 NOTE — Assessment & Plan Note (Signed)
Assessment:  She has stopped this med.  We did not get to discuss at the visit but I suspect she stopped due to significant reflux issues.   Plan:  Will see if her insurance will cover IV Zometa.

## 2015-10-03 NOTE — Assessment & Plan Note (Signed)
Assessment: Her main c/o today are chronic nighttime cough and reflux c/w her GERD.  She reports compliance with OTC Nexium 20mg  daily.  Saw Dr. Ewing SchleinMagod again last year and no new recommendations, per patient.   Plan:  D/c OTC Nexium and prescribe omeprazole 20mg  BID (switched brands in order to prescribe BID and also it may be cheaper to prescribe than have her continue buying OTC).  Will try BID dosing for the next month to see if her nighttime symptoms improve.

## 2015-10-03 NOTE — Progress Notes (Signed)
Subjective:    Patient ID: Sherri Mcmillan, female    DOB: 06/30/1932, 82 y.o.   MRN: [id number]  HPI Comments: Sherri Mcmillan is an 82 year old woman with PMH as below here for follow-up of her GERD and continued cough. Please see problem based charting for the status of these and other chronic conditions.    Past Medical History  Diagnosis Date  . Chronic pain     DJD knees, back, migranes, LLE varicose veins, and LLE neuropathy.   . Aortic stenosis     Dx ECHO 2008 , mild and aymptomatic , needs ECHO q 3-5 yrs  . Barrett's esophagus     Demonstrated on EGD 12/2010. EGD 09/17/2012 shows inflamed GE junctional mucosa without metaplasia, dysplasia, or malignancy.  . Chronic insomnia   . Chronic anxiety     Admission to [facility] (90s or early 2000)  for 6 weeks after mother died. Complicated again by death of her sister 2010. 2012 developed hallucinations and I refused to refill controlled meds unless she see psych which she is not agreable to  . Elevated cholesterol 10/11    LDL 155. Her 10 year risk (decreasing her age to 48 as the calculator won't go to age 89) is 21% ish. If consider her non smoker (which I wouldn't even though she says 1 pak lasts 2 weeks) risk is 12% ish. So goal for LDL is 130 or 100.  Marland Kitchen HTN (hypertension)     Controlled with 2 drug therapy  . Cataract   . Depression   . Gastroparesis     Demonstrated on GES 12/2010 by Dr Dalene Seltzer  . Bronchitis     hx of  . Left leg DVT (HCC) 09/28/2012  . Anemia 12/14/2012  . GERD (gastroesophageal reflux disease)   . H/O hiatal hernia   . Migraine     sometimes  . DVT (deep venous thrombosis) (HCC)    Current Outpatient Prescriptions on File Prior to Visit  Medication Sig Dispense Refill  . acetaminophen (TYLENOL) 500 MG tablet Take 1,000-1,500 mg by mouth every 6 (six) hours as needed for mild pain, moderate pain or headache.    Marland Kitchen acetylcysteine (MUCOMYST) 10 % nebulizer solution Take 4 mLs by nebulization 3 (three) times daily. 30  mL 12  . albuterol (PROVENTIL HFA;VENTOLIN HFA) 108 (90 Base) MCG/ACT inhaler Inhale 2 puffs into the lungs every 2 (two) hours as needed for wheezing or shortness of breath (cough). 1 Inhaler 1  . alendronate (FOSAMAX) 70 MG tablet Take 1 tablet (70 mg total) by mouth once a week. Take with a full glass of water on an empty stomach. (Patient not taking: Reported on 11/13/2014) 4 tablet 5  . ALPRAZolam (XANAX) 0.5 MG tablet Take 1 tablet (0.5 mg total) by mouth 2 (two) times daily as needed for anxiety or sleep. (Patient not taking: Reported on 07/03/2015) 20 tablet 0  . amLODipine (NORVASC) 10 MG tablet TAKE 1 TABLET (10 MG TOTAL) BY MOUTH DAILY WITH BREAKFAST. 30 tablet 3  . calcium-vitamin D (OSCAL WITH D) 500-200 MG-UNIT per tablet Take 1 tablet by mouth 2 (two) times daily. (Patient not taking: Reported on 10/09/2014) 30 tablet 0  . chlorpheniramine (CHLOR-TRIMETON) 4 MG tablet Take 1 tablet (4 mg total) by mouth every 4 (four) hours as needed for allergies. 14 tablet 0  . citalopram (CELEXA) 20 MG tablet TAKE 1 TABLET (20 MG TOTAL) BY MOUTH DAILY. 30 tablet 3  . dorzolamide-timolol (COSOPT) 22.3-6.8 MG/ML ophthalmic  solution Place 1 drop into the left eye 2 (two) times daily. Reported on 07/03/2015    . esomeprazole (NEXIUM) 20 MG capsule Take 20 mg by mouth daily at 12 noon.    . Fluticasone-Salmeterol (ADVAIR DISKUS) 250-50 MCG/DOSE AEPB Inhale 1 puff into the lungs 2 (two) times daily. 14 each 2  . latanoprost (XALATAN) 0.005 % ophthalmic solution Place 1 drop into the left eye at bedtime. Reported on 07/03/2015    . sucralfate (CARAFATE) 1 G tablet Take 1 tablet (1 g total) by mouth 4 (four) times daily -  with meals and at bedtime. (Patient not taking: Reported on 07/03/2015) 90 tablet 0  . [DISCONTINUED] Calcium Carb-Cholecalciferol 600-800 MG-UNIT TABS Take 600-800 mg by mouth 2 (two) times daily. (Patient not taking: Reported on 09/02/2014) 60 tablet 2  . [DISCONTINUED] propranolol (INDERAL) 40 MG  tablet Take 1 tablet (40 mg total) by mouth 2 (two) times daily. (Patient not taking: Reported on 09/02/2014) 60 tablet 1   No current facility-administered medications on file prior to visit.    Review of Systems  Constitutional: Positive for chills and appetite change. Negative for fever.       Loses interest in foods  Respiratory: Positive for cough. Negative for shortness of breath.   Cardiovascular: Positive for leg swelling. Negative for chest pain.  Gastrointestinal: Negative for nausea, vomiting, abdominal pain, diarrhea, constipation and blood in stool.       + reflux at night  Psychiatric/Behavioral: Positive for sleep disturbance. Negative for suicidal ideas and dysphoric mood.       Filed Vitals:   10/03/15 1318  BP: 183/92  Pulse: 78  Temp: 98.5 F (36.9 C)  TempSrc: Oral  Height: 5' (1.524 m)  Weight: 122 lb 14.4 oz (55.747 kg)  SpO2: 100%    Objective:   Physical Exam  Constitutional: She is oriented to person, place, and time. She appears well-developed. No distress.  HENT:  Head: Normocephalic and atraumatic.  Mouth/Throat: Oropharynx is clear and moist. No oropharyngeal exudate.  Eyes: Conjunctivae and EOM are normal. Pupils are equal, round, and reactive to light. No scleral icterus.  Neck: Neck supple.  Cardiovascular: Normal rate and regular rhythm.  Exam reveals no gallop and no friction rub.   Murmur heard. 2/6 AS murmur  Pulmonary/Chest: Effort normal and breath sounds normal. No respiratory distress. She has no wheezes. She has no rales.  Abdominal: Soft. Bowel sounds are normal. She exhibits no distension. There is no tenderness. There is no rebound and no guarding.  Musculoskeletal: Normal range of motion. She exhibits no edema or tenderness.  Neurological: She is alert and oriented to person, place, and time. No cranial nerve deficit.  Skin: Skin is warm and dry. She is not diaphoretic.  Psychiatric: She has a normal mood and affect. Her behavior  is normal.  Vitals reviewed.         Assessment & Plan:  Please see problem based charting for A&P.

## 2015-10-03 NOTE — Assessment & Plan Note (Addendum)
Assessment:  Mood appropriate today.  Denies SI.  She is still singing with her choir and says she enjoys that.  She is looking forward to her [birth year] tomorrow.  Not sleeping well due to getting up with cough.  Want something to "calm her down" at night so she can sleep.   Plan:  Continue Celexa 20mg  daily.  Avoid benzo in this woman age 82.  Advised good sleep hygiene, switching to BID PPI to address GERD symptoms (which seem to be contributing to poor sleep).  Can try melatonin if needed.

## 2015-10-03 NOTE — Assessment & Plan Note (Signed)
BP Readings from Last 3 Encounters:  10/03/15 183/92  08/14/15 128/68  07/03/15 140/74    Lab Results  Component Value Date   NA 138 09/05/2014   K 3.6 09/05/2014   CREATININE 0.95 09/05/2014    Assessment: Blood pressure control:  fair control (repeat SBP was 144 - not entered in EPIC) Progress toward BP goal:   near goal Comments: Compliant with amlodipine 10mg  daily.  Plan: Medications:  continue current medications:  Amlodipine 10mg  daily.   Educational resources provided:   Self management tools provided:   Other plans: RTC in 3 months for follow-up.

## 2015-10-03 NOTE — Assessment & Plan Note (Addendum)
Assessment:  CT 06/2015 shows stable pulm nodules compared to prior study 2015, so likely benign.  She has smoking hx.  Need to assess how many pack years at next visit. Plan:  Can likely d/c surveillance given stability in nodules over two year period.  However, if significant smoking hx would need continued surveillance.

## 2015-10-03 NOTE — Patient Instructions (Addendum)
1. Please take your omeprazole (similar med as Nexium) two times per day to see if this helps with symptoms.  I will send for records from Dr. Ewing SchleinMagod. I am sending your for an ultrasound of your heart to check on your aortic valve.     2. Please take all medications as prescribed.    3. If you have worsening of your symptoms or new symptoms arise, please call the clinic [phone]), or go to the ER immediately if symptoms are severe.  Please come back to clinic for follow-up in 1 month.

## 2015-10-03 NOTE — Assessment & Plan Note (Signed)
Assessment:  PFT show only mild obstruction w/o bronchodilator response.  CT chest with stable pulm nodules no other abnormalities.  No HF symptoms.  Nighttime cough persists.  Given occurrence at night and association with reflux this is suggestive of GERD.  She tells me she has been taking Nexium once daily in AM.  Symptoms worse at night. Plan:  Stop Nexium.  Start BID omeprazole x 4 weeks and monitor for improvement.  Continue sleeping at incline, avoiding foods that exacerbate.  Will send for recent GI records.

## 2015-10-03 NOTE — Assessment & Plan Note (Signed)
Assessment:  Asymptomatic. Normal S1S2 with 2/6 systolic murmur.  Last 2D ECHO 2012. Plan:  Refer for repeat 2D ECHO to check valve area since it has been 5 years.

## 2015-10-06 NOTE — Progress Notes (Signed)
Internal Medicine Clinic Attending  Case discussed with Dr. Wilson soon after the resident saw the patient.  We reviewed the resident's history and exam and pertinent patient test results.  I agree with the assessment, diagnosis, and plan of care documented in the resident's note.  

## 2015-10-08 ENCOUNTER — Encounter: Payer: Self-pay | Admitting: *Deleted

## 2015-10-17 ENCOUNTER — Telehealth: Payer: Self-pay | Admitting: Pharmacist

## 2015-10-17 NOTE — Telephone Encounter (Signed)
Patient contacted for education on zoledronic acid (Zometa). Zometa was reviewed with the patient, including name, instructions, indication, goals of therapy, and potential side effects.  Patient verbalized understanding by repeating back information and was advised to contact me if further medication-related questions arise. Patient stated she would be interested in this and would like us to reach out to her when treatment has been coordinated.

## 2015-11-10 ENCOUNTER — Other Ambulatory Visit: Payer: Self-pay

## 2015-11-10 ENCOUNTER — Other Ambulatory Visit (HOSPITAL_COMMUNITY): Payer: Commercial Managed Care - HMO

## 2015-11-10 ENCOUNTER — Ambulatory Visit (HOSPITAL_COMMUNITY): Payer: Commercial Managed Care - HMO | Attending: Cardiology

## 2015-11-10 DIAGNOSIS — Z87891 Personal history of nicotine dependence: Secondary | ICD-10-CM | POA: Insufficient documentation

## 2015-11-10 DIAGNOSIS — E785 Hyperlipidemia, unspecified: Secondary | ICD-10-CM | POA: Insufficient documentation

## 2015-11-10 DIAGNOSIS — J449 Chronic obstructive pulmonary disease, unspecified: Secondary | ICD-10-CM | POA: Diagnosis not present

## 2015-11-10 DIAGNOSIS — I872 Venous insufficiency (chronic) (peripheral): Secondary | ICD-10-CM | POA: Insufficient documentation

## 2015-11-10 DIAGNOSIS — I340 Nonrheumatic mitral (valve) insufficiency: Secondary | ICD-10-CM | POA: Insufficient documentation

## 2015-11-10 DIAGNOSIS — I350 Nonrheumatic aortic (valve) stenosis: Secondary | ICD-10-CM | POA: Diagnosis not present

## 2015-11-10 DIAGNOSIS — I371 Nonrheumatic pulmonary valve insufficiency: Secondary | ICD-10-CM | POA: Insufficient documentation

## 2015-11-10 DIAGNOSIS — I351 Nonrheumatic aortic (valve) insufficiency: Secondary | ICD-10-CM | POA: Diagnosis not present

## 2015-11-26 ENCOUNTER — Other Ambulatory Visit: Payer: Self-pay | Admitting: Internal Medicine

## 2015-11-26 ENCOUNTER — Telehealth: Payer: Self-pay | Admitting: Internal Medicine

## 2015-11-26 NOTE — Telephone Encounter (Signed)
APT. REMINDER CALL, LMTCB °

## 2015-11-27 ENCOUNTER — Encounter: Payer: Self-pay | Admitting: Internal Medicine

## 2015-11-27 ENCOUNTER — Ambulatory Visit (INDEPENDENT_AMBULATORY_CARE_PROVIDER_SITE_OTHER): Payer: Commercial Managed Care - HMO | Admitting: Internal Medicine

## 2015-11-27 VITALS — BP 130/78 | HR 76 | Temp 97.7°F | Ht 65.0 in | Wt 120.9 lb

## 2015-11-27 DIAGNOSIS — F418 Other specified anxiety disorders: Secondary | ICD-10-CM

## 2015-11-27 DIAGNOSIS — F419 Anxiety disorder, unspecified: Principal | ICD-10-CM

## 2015-11-27 DIAGNOSIS — G4700 Insomnia, unspecified: Secondary | ICD-10-CM

## 2015-11-27 DIAGNOSIS — K219 Gastro-esophageal reflux disease without esophagitis: Secondary | ICD-10-CM | POA: Diagnosis not present

## 2015-11-27 DIAGNOSIS — F329 Major depressive disorder, single episode, unspecified: Secondary | ICD-10-CM

## 2015-11-27 MED ORDER — CITALOPRAM HYDROBROMIDE 20 MG PO TABS
ORAL_TABLET | ORAL | 1 refills | Status: DC
Start: 2015-11-27 — End: 2016-04-05

## 2015-11-27 MED ORDER — MELATONIN 1 MG PO CAPS: 2.0000 mg | ORAL_CAPSULE | Freq: Every day | ORAL | 0 refills | Status: DC

## 2015-11-27 MED ORDER — SUCRALFATE 1 G PO TABS: 1.0000 g | ORAL_TABLET | Freq: Four times a day (QID) | ORAL | 1 refills | Status: DC

## 2015-11-27 MED ORDER — OMEPRAZOLE 20 MG PO CPDR: 20.0000 mg | DELAYED_RELEASE_CAPSULE | Freq: Two times a day (BID) | ORAL | 1 refills | Status: DC

## 2015-11-27 MED ORDER — PROPRANOLOL HCL 20 MG PO TABS: 20.0000 mg | ORAL_TABLET | Freq: Two times a day (BID) | ORAL | 0 refills | Status: DC

## 2015-11-27 NOTE — Assessment & Plan Note (Signed)
Patient reports an increased difficulty with sleeping. She notes that she has been going to bed and then waking up 1-1.5 hours later with difficulty falling back asleep for the rest the night. She reports that she does not have difficulty with sleep hygiene, although she does report watching television several nights a week before that. She has not taken any sleep aids, but does endorse that some of the difficulty is related to her reflux symptoms.  Patient is concerned about her insomnia because she feels that it is worsening her general anxiety. She notes that recently she has been having tingling sensations in her arms and legs when she gets stressed out and feels very anxious.  She is taking citalopram 20 mg currently. She has a history of benzodiazepine dependence, and they should be avoided in a woman of her age. I would consider increasing her citalopram, but would like to avoid this due to adverse side effects. For now I would recommend initiating propranolol 20 mg twice a day therapy for anxiety attacks. I would also recommend starting small dose of melatonin 2 mg nightly in order to help her sleep.  It is my hope that better control of her anxiety and insomnia will improve her overall functioning and lead to better control of her GERD. We will follow-up in one month. Patient has been advised of the adverse effects of propranolol including low heart rate and fatigue, she was advised to call our clinic if she should notice any excessive symptoms of the above.

## 2015-11-27 NOTE — Assessment & Plan Note (Signed)
Patient reports significant symptoms of reflux. She notes that she is having several episodes nightly of a mucous-like substance caught in her throat with coughing and gagging. She notes that the symptoms bother her even in spite of her 3 pillows and twice a day omeprazole. She does have some of these symptoms during the day but it is sporadic. She notes that her symptoms are not as bad as prior to her fundoplication, but have been worsening in the last several weeks. Also notes that she wakes in the morning with a bitter dry mouth.  Patient is concerned about these symptoms because there have making it difficult for her to sleep.  Patient is taking sucralfate 4 times a day with meals, and omeprazole twice a day. She is also followed by GI, Dr. Arlyce Dice, and has a documented GES that shows gastroparesis. She has previously been on Reglan, but is not currently taking this medication due to its relative contraindication in a woman her age.  We will attempt to treat her insomnia, and anxiety, and consider other medications to accelerate her gastric emptying such as azithromycin. We'll follow-up in one month.

## 2015-11-27 NOTE — Progress Notes (Signed)
I saw and evaluated the patient.  I personally confirmed the key portions of Dr. Erenest Rasher history and exam and reviewed pertinent patient test results.  The assessment, diagnosis, and plan were formulated together and I agree with the documentation in the resident's note.

## 2015-11-27 NOTE — Progress Notes (Signed)
CC: anxiety and GERD HPI: Sherri Mcmillan is a 82 y.o. female with a h/o of hiatal hernia s/p fundoplication and persistent GERD, anxiety, HTN, HLD who presents for management of her anxiety, insomnia, and GERD.  Please see Problem-based charting for HPI and the status of patient's chronic medical conditions.  Past Medical History:  Diagnosis Date  . Anemia 12/14/2012  . Aortic stenosis    Dx ECHO 2008 , mild and aymptomatic , needs ECHO q 3-5 yrs  . Barrett's esophagus    Demonstrated on EGD 12/2010. EGD 09/17/2012 shows inflamed GE junctional mucosa without metaplasia, dysplasia, or malignancy.  . Bronchitis    hx of  . Cataract   . Chronic anxiety    Admission to [facility] (90s or early 2000)  for 6 weeks after mother died. Complicated again by death of her sister 2010. 2012 developed hallucinations and I refused to refill controlled meds unless she see psych which she is not agreable to  . Chronic insomnia   . Chronic pain    DJD knees, back, migranes, LLE varicose veins, and LLE neuropathy.   . Depression   . DVT (deep venous thrombosis) (HCC)   . Elevated cholesterol 10/11   LDL 155. Her 10 year risk (decreasing her age to 58 as the calculator won't go to age 61) is 21% ish. If consider her non smoker (which I wouldn't even though she says 1 pak lasts 2 weeks) risk is 12% ish. So goal for LDL is 130 or 100.  Marland Kitchen Gastroparesis    Demonstrated on GES 12/2010 by Dr Dalene Seltzer  . GERD (gastroesophageal reflux disease)   . H/O hiatal hernia   . HTN (hypertension)    Controlled with 2 drug therapy  . Left leg DVT (HCC) 09/28/2012  . Migraine    sometimes   Social Hx: Pt spends most of her time involved with her church, visiting the congregation, singing, and leading a spiritual group.  Review of Systems: ROS in HPI. Otherwise: Review of Systems  Constitutional: Negative for chills, fever and weight loss.  Respiratory: Negative for cough and shortness of breath.   Cardiovascular:  Negative for chest pain and leg swelling.  Gastrointestinal: Positive for heartburn. Negative for abdominal pain, constipation, diarrhea, nausea and vomiting.  Genitourinary: Negative for dysuria, frequency and urgency.  Neurological: Positive for tingling, tremors, speech change (hoarseness) and headaches.  Psychiatric/Behavioral: Negative for depression. The patient is nervous/anxious and has insomnia.     Physical Exam: Vitals:   11/27/15 1330  BP: 130/78  Pulse: 76  Temp: 97.7 F (36.5 C)  TempSrc: Oral  Weight: 120 lb 14.4 oz (54.8 kg)  Height: 5\' 5"  (1.651 m)   Physical Exam  Constitutional: She appears well-developed. She is cooperative. No distress.  HENT:  Head: Normocephalic and atraumatic.  Nose: Nose normal.  Mouth/Throat: Oropharynx is clear and moist. No oropharyngeal exudate.  Cardiovascular: Normal rate, regular rhythm, normal heart sounds and normal pulses.  Exam reveals no gallop.   No murmur heard. Pulmonary/Chest: Effort normal and breath sounds normal. No respiratory distress. She has no wheezes. She has no rhonchi. She has no rales. Breasts are symmetrical.  Abdominal: Soft. Bowel sounds are normal. There is no tenderness.  Musculoskeletal: She exhibits no edema.    Assessment & Plan:  See encounters tab for problem based medical decision making. Patient seen with Dr. Josem Kaufmann  GERD (gastroesophageal reflux disease) Patient reports significant symptoms of reflux. She notes that she is having several episodes  nightly of a mucous-like substance caught in her throat with coughing and gagging. She notes that the symptoms bother her even in spite of her 3 pillows and twice a day omeprazole. She does have some of these symptoms during the day but it is sporadic. She notes that her symptoms are not as bad as prior to her fundoplication, but have been worsening in the last several weeks. Also notes that she wakes in the morning with a bitter dry mouth.  Patient is  concerned about these symptoms because there have making it difficult for her to sleep.  Patient is taking sucralfate 4 times a day with meals, and omeprazole twice a day. She is also followed by GI, Dr. Arlyce Dice, and has a documented GES that shows gastroparesis. She has previously been on Reglan, but is not currently taking this medication due to its relative contraindication in a woman her age.  We will attempt to treat her insomnia, and anxiety, and consider other medications to accelerate her gastric emptying such as azithromycin. We'll follow-up in one month.  Anxiety and depression Patient reports an increased difficulty with sleeping. She notes that she has been going to bed and then waking up 1-1.5 hours later with difficulty falling back asleep for the rest the night. She reports that she does not have difficulty with sleep hygiene, although she does report watching television several nights a week before that. She has not taken any sleep aids, but does endorse that some of the difficulty is related to her reflux symptoms.  Patient is concerned about her insomnia because she feels that it is worsening her general anxiety. She notes that recently she has been having tingling sensations in her arms and legs when she gets stressed out and feels very anxious.  She is taking citalopram 20 mg currently. She has a history of benzodiazepine dependence, and they should be avoided in a woman of her age. I would consider increasing her citalopram, but would like to avoid this due to adverse side effects. For now I would recommend initiating propranolol 20 mg twice a day therapy for anxiety attacks. I would also recommend starting small dose of melatonin 2 mg nightly in order to help her sleep.  It is my hope that better control of her anxiety and insomnia will improve her overall functioning and lead to better control of her GERD. We will follow-up in one month. Patient has been advised of the adverse  effects of propranolol including low heart rate and fatigue, she was advised to call our clinic if she should notice any excessive symptoms of the above.   Signed: Carolynn Comment, MD 11/27/2015, 4:10 PM  Pager: [phone]

## 2015-11-27 NOTE — Patient Instructions (Signed)
Take the melatonin before bed to help with sleep.  Take the propranolol two times each day with your other medications to help with anxiety.  Come back in 1 month.

## 2015-11-30 ENCOUNTER — Other Ambulatory Visit: Payer: Self-pay | Admitting: Internal Medicine

## 2015-11-30 DIAGNOSIS — K219 Gastro-esophageal reflux disease without esophagitis: Secondary | ICD-10-CM

## 2016-01-01 ENCOUNTER — Ambulatory Visit (INDEPENDENT_AMBULATORY_CARE_PROVIDER_SITE_OTHER): Payer: Commercial Managed Care - HMO | Admitting: Internal Medicine

## 2016-01-01 ENCOUNTER — Encounter: Payer: Self-pay | Admitting: Internal Medicine

## 2016-01-01 VITALS — BP 143/75 | HR 64 | Temp 98.2°F | Ht 60.0 in | Wt 125.8 lb

## 2016-01-01 DIAGNOSIS — L729 Follicular cyst of the skin and subcutaneous tissue, unspecified: Secondary | ICD-10-CM | POA: Diagnosis not present

## 2016-01-01 DIAGNOSIS — I10 Essential (primary) hypertension: Secondary | ICD-10-CM | POA: Diagnosis not present

## 2016-01-01 DIAGNOSIS — Z87891 Personal history of nicotine dependence: Secondary | ICD-10-CM

## 2016-01-01 DIAGNOSIS — F419 Anxiety disorder, unspecified: Secondary | ICD-10-CM

## 2016-01-01 DIAGNOSIS — R51 Headache: Secondary | ICD-10-CM | POA: Diagnosis not present

## 2016-01-01 DIAGNOSIS — R519 Headache, unspecified: Secondary | ICD-10-CM

## 2016-01-01 DIAGNOSIS — K219 Gastro-esophageal reflux disease without esophagitis: Secondary | ICD-10-CM | POA: Diagnosis not present

## 2016-01-01 DIAGNOSIS — F329 Major depressive disorder, single episode, unspecified: Secondary | ICD-10-CM

## 2016-01-01 DIAGNOSIS — F418 Other specified anxiety disorders: Secondary | ICD-10-CM

## 2016-01-01 DIAGNOSIS — G4700 Insomnia, unspecified: Secondary | ICD-10-CM

## 2016-01-01 DIAGNOSIS — L723 Sebaceous cyst: Secondary | ICD-10-CM

## 2016-01-01 MED ORDER — MELATONIN 1 MG PO CAPS: 2.0000 mg | ORAL_CAPSULE | Freq: Every day | ORAL | 2 refills | Status: DC

## 2016-01-01 MED ORDER — OMEPRAZOLE 20 MG PO CPDR: 20.0000 mg | DELAYED_RELEASE_CAPSULE | Freq: Two times a day (BID) | ORAL | 2 refills | Status: DC

## 2016-01-01 MED ORDER — PROPRANOLOL HCL 20 MG PO TABS: 20.0000 mg | ORAL_TABLET | Freq: Two times a day (BID) | ORAL | 2 refills | Status: DC

## 2016-01-01 NOTE — Patient Instructions (Signed)
Thank you for your visit. I'm glad to hear that you are sleeping better and that your headaches are improved. We will continue your current medications.  I will make a referral to a surgeon for the removal of the cyst on the back of your neck. They will contact you with an appointment.

## 2016-01-01 NOTE — Assessment & Plan Note (Signed)
Patient was complaining of worsening insomnia at her last visit. We started melatonin 2 mg daily at bedtime. Patient is reporting significant increase in her quality of life with better quality sleep. She denies any significant daytime fatigue, and notes that she is able to get more hours of sleep at night. Patient denies any adverse reactions medication. We will plan to continue this medication indefinitely as it seems providing good relief.

## 2016-01-01 NOTE — Progress Notes (Signed)
CC: f/u insomnia, anxiety, and new "bump" on back of neck HPI: [name] is a 82 y.o. female with a h/o of anxiety/depression, insomnia, HTN, GERD 2/2 hiatal hernia s/p Nissen who presents for follow up of her anxiety, insomnia, and GERD. She also complains of a recently enlarged cystic lesion on the back of her neck.  Please see Problem-based charting for HPI and the status of patient's chronic medical conditions.  Past Medical History:  Diagnosis Date  . Anemia 12/14/2012  . Aortic stenosis    Dx ECHO 2008 , mild and aymptomatic , needs ECHO q 3-5 yrs  . Barrett's esophagus    Demonstrated on EGD 12/2010. EGD 09/17/2012 shows inflamed GE junctional mucosa without metaplasia, dysplasia, or malignancy.  . Bronchitis    hx of  . Cataract   . Chronic anxiety    [facility] (90s or early 2000)  for 6 weeks after mother died. Complicated again by death of her sister 2010. 2012 developed hallucinations and I refused to refill controlled meds unless she see psych which she is not agreable to  . Chronic insomnia   . Chronic pain    DJD knees, back, migranes, LLE varicose veins, and LLE neuropathy.   . Depression   . DVT (deep venous thrombosis) (HCC)   . Elevated cholesterol 10/11   LDL 155. Her 10 year risk (decreasing her age to [age] as the calculator won't go to age 82) is 21% ish. If consider her non smoker (which I wouldn't even though she says 1 pak lasts 2 weeks) risk is 12% ish. So goal for LDL is 130 or 100.  Marland Kitchen. Gastroparesis    Demonstrated on GES 12/2010 by Dr Dalene SeltzerKapland  . GERD (gastroesophageal reflux disease)   . H/O hiatal hernia   . HTN (hypertension)    Controlled with 2 drug therapy  . Left leg DVT (HCC) 09/28/2012  . Migraine    sometimes   Social Hx: Pt is currently engaged in church work with her pastor. She fills much of her free time volunteering with her [employer] and singing gospel music.  Review of Systems: ROS in HPI. Otherwise: Review of Systems    Constitutional: Negative for chills, fever and weight loss.  Respiratory: Negative for cough and shortness of breath.   Cardiovascular: Negative for chest pain and leg swelling.  Gastrointestinal: Negative for abdominal pain, constipation, diarrhea, nausea and vomiting.  Genitourinary: Negative for dysuria, frequency and urgency.    Physical Exam: Vitals:   01/01/16 1405  BP: (!) 143/75  Pulse: 64  Temp: 98.2 F (36.8 C)  TempSrc: Oral  SpO2: 100%  Weight: 125 lb 12.8 oz (57.1 kg)  Height: 5' (1.524 m)   Physical Exam  Constitutional: She appears well-developed. She is cooperative. No distress.  Cardiovascular: Normal rate, regular rhythm, normal heart sounds and normal pulses.  Exam reveals no gallop.   No murmur heard. Pulmonary/Chest: Effort normal and breath sounds normal. No respiratory distress. She has no wheezes. She has no rhonchi. She has no rales. Breasts are symmetrical.  Abdominal: Soft. Bowel sounds are normal. There is no tenderness.  Musculoskeletal: She exhibits no edema.  Skin: Skin is warm and dry. No rash noted.       Assessment & Plan:  See encounters tab for problem based medical decision making. Patient seen with Dr. Criselda PeachesMullen  Epidermiod Cyst Pt presents with cyst on the back of her neck which she reports has been getting larger and more  firm lately. She notes that she has had this cyst for many years and her doctor has previously told her that they could have it removed if it got larger. She endorses some minor tenderness around this area, but denies any drainage. She notes that she has become worried recently when she was discussing this with a friend who had a similar complaint which turned out to be cancer.  Upon exam it appears that the cyst in question is a sebaceous cyst and a small amount of thick gray material was expressed. The lesion is mobile and well-circumscribed. I recommended that the best course would be complete excision by a surgeon in  order to prevent recurrence. Pt was in agreement and a referral to general surgery was made.  Insomnia Patient was complaining of worsening insomnia at her last visit. We started melatonin 2 mg daily at bedtime. Patient is reporting significant increase in her quality of life with better quality sleep. She denies any significant daytime fatigue, and notes that she is able to get more hours of sleep at night. Patient denies any adverse reactions medication. We will plan to continue this medication indefinitely as it seems providing good relief.  GERD (gastroesophageal reflux disease) Patient reports that her gastric reflux is significantly improved since last visit. She notes that she is not having as many episodes of reflux at night. I believe that her better control insomnia is improving her overall symptoms of reflux. She is compliant with her sucralfate 4 times a day, as well as her omeprazole twice a day. We'll consider adding a promotility agent for her gastroparesis, but given her good control at this time we will defer this to a later date. Continue to assess at future visits.  HTN (hypertension) Patient's blood pressure is well controlled today. She is reporting good compliance with her antihypertensive therapy of amlodipine 10 mg daily. Her blood pressure today is 145/73. Patient denies any headache, chest pain, palpitations, shortness of breath, lightheadedness, lower show any swelling. We will continue her current therapy and plan to reassess her blood pressure in 3 months.  Headache Patient was complaining of chronically recurrent headaches at her last appointment. We started propranolol as prophylactic medication against suspected migraines in addition to treatment for her episodic panic attacks. She has had good relief on this therapy and reports that her headaches have resolved completely. She denies any daytime fatigue, shortness of breath, lightheadedness or dizziness with changes in  posture. Her heart rate today is 64 but she seems to tolerating this very well. We will continue to monitor her on this therapy. In addition to controlling her headaches I feel that this is having a positive effect on her blood pressure. Follow-up in 3 months.  Anxiety and depression Patient reports that her anxiety is better controlled with the success in treating her insomnia and headaches. We will continue her current anxiolytics with citalopram 20 mg daily. In addition we will continue her propranolol 20 mg twice a day, and melatonin 2 mg daily at bedtime to treat her comorbid conditions and reduce the risk of exacerbation for her underlying anxiety. Patient continues to stay significantly active with her church and I feel that this provides important outlet for her. She denies any suicidal ideation at this time.   Signed: Carolynn Comment, MD 01/01/2016, 5:00 PM  Pager: [phone]

## 2016-01-01 NOTE — Assessment & Plan Note (Signed)
Patient reports that her gastric reflux is significantly improved since last visit. She notes that she is not having as many episodes of reflux at night. I believe that her better control insomnia is improving her overall symptoms of reflux. She is compliant with her sucralfate 4 times a day, as well as her omeprazole twice a day. We'll consider adding a promotility agent for her gastroparesis, but given her good control at this time we will defer this to a later date. Continue to assess at future visits.

## 2016-01-01 NOTE — Assessment & Plan Note (Signed)
Patient was complaining of chronically recurrent headaches at her last appointment. We started propranolol as prophylactic medication against suspected migraines in addition to treatment for her episodic panic attacks. She has had good relief on this therapy and reports that her headaches have resolved completely. She denies any daytime fatigue, shortness of breath, lightheadedness or dizziness with changes in posture. Her heart rate today is 64 but she seems to tolerating this very well. We will continue to monitor her on this therapy. In addition to controlling her headaches I feel that this is having a positive effect on her blood pressure. Follow-up in 3 months.

## 2016-01-01 NOTE — Assessment & Plan Note (Signed)
Pt presents with cyst on the back of her neck which she reports has been getting larger and more firm lately. She notes that she has had this cyst for many years and her doctor has previously told her that they could have it removed if it got larger. She endorses some minor tenderness around this area, but denies any drainage. She notes that she has become worried recently when she was discussing this with a friend who had a similar complaint which turned out to be cancer.  Upon exam it appears that the cyst in question is a sebaceous cyst and a small amount of thick gray material was expressed. The lesion is mobile and well-circumscribed. I recommended that the best course would be complete excision by a surgeon in order to prevent recurrence. Pt was in agreement and a referral to general surgery was made.

## 2016-01-01 NOTE — Assessment & Plan Note (Signed)
Patient reports that her anxiety is better controlled with the success in treating her insomnia and headaches. We will continue her current anxiolytics with citalopram 20 mg daily. In addition we will continue her propranolol 20 mg twice a day, and melatonin 2 mg daily at bedtime to treat her comorbid conditions and reduce the risk of exacerbation for her underlying anxiety. Patient continues to stay significantly active with her church and I feel that this provides important outlet for her. She denies any suicidal ideation at this time.

## 2016-01-01 NOTE — Assessment & Plan Note (Addendum)
Patient's blood pressure is well controlled today. She is reporting good compliance with her antihypertensive therapy of amlodipine 10 mg daily. Her blood pressure today is 145/73. Patient denies any headache, chest pain, palpitations, shortness of breath, lightheadedness, lower show any swelling. We will continue her current therapy and plan to reassess her blood pressure in 3 months.

## 2016-01-05 NOTE — Progress Notes (Signed)
Internal Medicine Clinic Attending  I saw and evaluated the patient.  I personally confirmed the key portions of the history and exam documented by Dr. Strelow and I reviewed pertinent patient test results.  The assessment, diagnosis, and plan were formulated together and I agree with the documentation in the resident's note. 

## 2016-01-15 ENCOUNTER — Ambulatory Visit: Payer: Self-pay | Admitting: General Surgery

## 2016-01-15 DIAGNOSIS — L723 Sebaceous cyst: Secondary | ICD-10-CM | POA: Diagnosis not present

## 2016-01-15 NOTE — H&P (Signed)
History of Present Illness Axel Filler(  MD; 01/15/2016 11:07 AM) The patient is a 82 year old female who presents with a complaint of sebaceous cyst. Patient is a 82 year old female who is referred by Debe CoderEmily Mullen, M.D. for evaluation of a posterior head sebaceous cyst. Patient states been there for 30+ years. She states got bigger and become more discomforting. She states her been no drainage from the area.   Allergies Fay Records(Ashley Beck, CMA; 01/15/2016 10:59 AM) Aspirin EC *ANALGESICS - NonNarcotic* Propoxyphene N *ANALGESICS - OPIOID* OxyCODONE HCl *ANALGESICS - OPIOID* MILK  Medication History Fay Records(Ashley Beck, CMA; 01/15/2016 10:59 AM) AmLODIPine Besylate (10MG  Tablet, Oral) Active. Omeprazole (20MG  Capsule DR, Oral) Active. Propranolol HCl (20MG  Tablet, Oral) Active. Sucralfate (1GM Tablet, Oral) Active. Ventolin HFA (108 (90 Base)MCG/ACT Aerosol Soln, Inhalation as needed) Active. Stool Softener (100MG  Capsule, Oral) Active. Medications Reconciled  Vitals Fay Records(Ashley Beck CMA; 01/15/2016 10:59 AM) 01/15/2016 10:59 AM Weight: 122 lb Height: 60in Body Surface Area: 1.51 m Body Mass Index: 23.83 kg/m  Temp.: 6F(Temporal)  Pulse: 65 (Regular)  BP: 138/70 (Sitting, Left Arm, Standard)       Physical Exam Axel Filler(  MD; 01/15/2016 11:07 AM) General Mental Status-Alert. General Appearance-Consistent with stated age. Hydration-Well hydrated. Voice-Normal.  Head and Neck Note: Approximately approximately 3 x 4 cm subcutaneous, posterior scalp cyst   Chest and Lung Exam Chest and lung exam reveals -quiet, even and easy respiratory effort with no use of accessory muscles and on auscultation, normal breath sounds, no adventitious sounds and normal vocal resonance. Inspection Chest Wall - Normal. Back - normal.  Cardiovascular Cardiovascular examination reveals -normal heart sounds, regular rate and rhythm with no murmurs and normal pedal  pulses bilaterally.  Abdomen Inspection Inspection of the abdomen reveals - No Hernias. Skin - Scar - no surgical scars. Palpation/Percussion Palpation and Percussion of the abdomen reveal - Soft, Non Tender, No Rebound tenderness, No Rigidity (guarding) and No hepatosplenomegaly. Auscultation Auscultation of the abdomen reveals - Bowel sounds normal.  Neurologic Neurologic evaluation reveals -alert and oriented x 3 with no impairment of recent or remote memory. Mental Status-Normal.    Assessment & Plan Axel Filler(  MD; 01/15/2016 11:08 AM) SEBACEOUS CYST (L72.3) Impression: Patient is an 82 year old female with a posterior scalp sebaceous cyst 1. The patient like to proceed to the operative for excision of sebaceous cyst 2. I discussed the risks and benefits of the procedure to include but not limited to: Infection, bleeding, damage to structures, possible recurrence. Patient was understanding and wished to proceed.

## 2016-02-02 ENCOUNTER — Other Ambulatory Visit: Payer: Self-pay | Admitting: *Deleted

## 2016-02-02 MED ORDER — SUCRALFATE 1 G PO TABS: 1.0000 g | ORAL_TABLET | Freq: Four times a day (QID) | ORAL | 1 refills | Status: DC

## 2016-03-01 ENCOUNTER — Encounter (HOSPITAL_COMMUNITY): Payer: Self-pay

## 2016-03-01 ENCOUNTER — Encounter (HOSPITAL_COMMUNITY)
Admission: RE | Admit: 2016-03-01 | Discharge: 2016-03-01 | Disposition: A | Discharge status: Home / Self Care | Payer: Commercial Managed Care - HMO | Source: Ambulatory Visit | Attending: General Surgery | Admitting: General Surgery

## 2016-03-01 DIAGNOSIS — Z01818 Encounter for other preprocedural examination: Secondary | ICD-10-CM | POA: Insufficient documentation

## 2016-03-01 LAB — CBC
HEMATOCRIT: 30.5 % — AB (ref 36.0–46.0)
Hemoglobin: 9.2 g/dL — ABNORMAL LOW (ref 12.0–15.0)
MCH: 23.4 pg — AB (ref 26.0–34.0)
MCHC: 30.2 g/dL (ref 30.0–36.0)
MCV: 77.6 fL — AB (ref 78.0–100.0)
Platelets: 200 10*3/uL (ref 150–400)
RBC: 3.93 MIL/uL (ref 3.87–5.11)
RDW: 16.6 % — AB (ref 11.5–15.5)
WBC: 4.2 10*3/uL (ref 4.0–10.5)

## 2016-03-01 LAB — BASIC METABOLIC PANEL
ANION GAP: 8 (ref 5–15)
BUN: 11 mg/dL (ref 6–20)
CALCIUM: 9.2 mg/dL (ref 8.9–10.3)
CO2: 26 mmol/L (ref 22–32)
Chloride: 106 mmol/L (ref 101–111)
Creatinine, Ser: 0.76 mg/dL (ref 0.44–1.00)
GFR calc Af Amer: >60 mL/min (ref >60)
GFR calc non Af Amer: >60 mL/min (ref >60)
GLUCOSE: 85 mg/dL (ref 65–99)
Potassium: 3.4 mmol/L — ABNORMAL LOW (ref 3.5–5.1)
Sodium: 140 mmol/L (ref 135–145)

## 2016-03-01 LAB — GLUCOSE, CAPILLARY: Glucose-Capillary: 218 mg/dL — ABNORMAL HIGH (ref 65–99)

## 2016-03-01 NOTE — Progress Notes (Signed)
Anesthesia Chart Review:  Pt is an 82 year old female scheduled for excision posterior scalp mass on 03/05/2016 with Axel FillerArmando Ramirez, MD.   PCP is Carolynn CommentBryan [facility]low[facility] who referred pt for surgery.   PMH includes:  HTN, aortic stenosis, DVT, anemia, hyperlipidemia, GERD.  Former smoker. BMI 24. S/p nissen fundoplication 05/02/13.   Medications include: albuterol, amlodipine, advair, prilosec, propranolol  Preoperative labs reviewed.  H/H 9.2/30.5.  Baseline hgb appears to be between 10-11.  I routed CBC to PCP for review. Dr. Derrell Lollingamirez is aware of results.   CT chest 07/08/15:  - Tiny subpleural right upper lobe pulmonary nodules are stable since prior study. Given their stability for nearly 2 years, these are most likely benign. Biapical scarring. - Small hiatal hernia. - Small layering gallstones or gravel, stable since prior study. - No acute findings.  EKG 03/01/16: NSR.   Echo 11/10/15:  - Left ventricle: The cavity size was normal. Systolic function was vigorous. The estimated ejection fraction was in the range of 65% to 70%. Wall motion was normal; there were no regional wall motion abnormalities. Features are consistent with a pseudonormal left ventricular filling pattern, with concomitant abnormal relaxation and increased filling pressure (grade 2 diastolic dysfunction). Doppler parameters are consistent with high ventricular filling pressure. - Aortic valve: Moderate diffuse thickening and calcification. There was moderate regurgitation. - Mitral valve: There was trivial regurgitation. - Pulmonic valve: There was mild regurgitation. - Pulmonary arteries: PA peak pressure: 39 mm Hg (S).  If no changes, I anticipate pt can proceed with surgery as scheduled.   Rica Mastngela , FNP-BC [facility]Anesthesiology Phone: [phone] 03/01/2016 3:49 PM

## 2016-03-01 NOTE — Progress Notes (Addendum)
Patient states that approx. 10 yrs plus, she had some chest pains, SOB.  Was worked up then (unsure of diagnosis then) Went to see heart doctor then and hasn't seen anyone since.  She does see Dr. Guy BeginB Strelow, at our Outpatient Clinic.  LOV 01/2016 She did have DVT back in 2015. No longer on coumadin. Echo was 10/2015  EF 65-70%

## 2016-03-01 NOTE — Pre-Procedure Instructions (Signed)
Sherri Mcmillan  03/01/2016      [id number] [place]- [place], [place] - [address] [place] [address] Phone: [phone] Fax: [phone]    Your procedure is scheduled on Friday, November 10th   [facility] Admitting at 11:30 AM             (posted surgery time 1:35 - 2:45 pm)   Call this number if you have problems the MORNING of surgery:  [phone]   Remember:  Do not eat food or drink liquids after midnight Thursday.   Take these medicines the morning of surgery with A SIP OF WATER : Prilosec (Omeprazole),             Inderal (Propranolol), Norvasc (Amlodipine)   Do not wear jewelry, make-up or nail polish.  Do not wear lotions, powders,  perfumes, or deoderant.    Do not bring valuables to the hospital.  [facility] is not responsible for any belongings or valuables.  Contacts, dentures or bridgework may not be worn into surgery.  Leave your suitcase in the car.  After surgery it may be brought to your room.  Patients discharged the day of surgery will not be allowed to drive home.   Please read over the following fact sheets that you were given. Pain Booklet and Surgical Site Infection Prevention

## 2016-03-05 ENCOUNTER — Ambulatory Visit (HOSPITAL_COMMUNITY): Payer: Commercial Managed Care - HMO | Admitting: Emergency Medicine

## 2016-03-05 ENCOUNTER — Ambulatory Visit (HOSPITAL_COMMUNITY): Payer: Commercial Managed Care - HMO | Admitting: Anesthesiology

## 2016-03-05 ENCOUNTER — Ambulatory Visit (HOSPITAL_COMMUNITY)
Admission: RE | Admit: 2016-03-05 | Discharge: 2016-03-05 | Disposition: A | Discharge status: Home / Self Care | Payer: Commercial Managed Care - HMO | Source: Ambulatory Visit | Attending: General Surgery | Admitting: General Surgery

## 2016-03-05 ENCOUNTER — Encounter (HOSPITAL_COMMUNITY)
Admission: RE | Disposition: A | Discharge status: Home / Self Care | Payer: Self-pay | Source: Ambulatory Visit | Attending: General Surgery

## 2016-03-05 DIAGNOSIS — Z87891 Personal history of nicotine dependence: Secondary | ICD-10-CM | POA: Insufficient documentation

## 2016-03-05 DIAGNOSIS — I10 Essential (primary) hypertension: Secondary | ICD-10-CM | POA: Insufficient documentation

## 2016-03-05 DIAGNOSIS — F418 Other specified anxiety disorders: Secondary | ICD-10-CM | POA: Diagnosis not present

## 2016-03-05 DIAGNOSIS — K219 Gastro-esophageal reflux disease without esophagitis: Secondary | ICD-10-CM | POA: Diagnosis not present

## 2016-03-05 DIAGNOSIS — Z79899 Other long term (current) drug therapy: Secondary | ICD-10-CM | POA: Diagnosis not present

## 2016-03-05 DIAGNOSIS — L723 Sebaceous cyst: Secondary | ICD-10-CM | POA: Diagnosis not present

## 2016-03-05 DIAGNOSIS — L729 Follicular cyst of the skin and subcutaneous tissue, unspecified: Secondary | ICD-10-CM | POA: Diagnosis not present

## 2016-03-05 DIAGNOSIS — L720 Epidermal cyst: Secondary | ICD-10-CM | POA: Diagnosis not present

## 2016-03-05 DIAGNOSIS — G4700 Insomnia, unspecified: Secondary | ICD-10-CM | POA: Diagnosis not present

## 2016-03-05 HISTORY — PX: EXCISION MASS HEAD: SHX6702

## 2016-03-05 SURGERY — EXCISION, MASS, HEAD
Anesthesia: General | Site: Head

## 2016-03-05 MED ORDER — 0.9 % SODIUM CHLORIDE (POUR BTL) OPTIME
TOPICAL | Status: DC | PRN
  Administered 2016-03-05: 1000 mL

## 2016-03-05 MED ORDER — SODIUM CHLORIDE 0.9% FLUSH: 3.0000 mL | INTRAVENOUS | Status: DC | PRN

## 2016-03-05 MED ORDER — SUGAMMADEX SODIUM 200 MG/2ML IV SOLN
INTRAVENOUS | Status: DC | PRN
  Administered 2016-03-05: 200 mg via INTRAVENOUS

## 2016-03-05 MED ORDER — LIDOCAINE HCL (CARDIAC) 20 MG/ML IV SOLN
INTRAVENOUS | Status: DC | PRN
  Administered 2016-03-05: 50 mg via INTRATRACHEAL

## 2016-03-05 MED ORDER — FENTANYL CITRATE (PF) 100 MCG/2ML IJ SOLN
INTRAMUSCULAR | Status: AC
  Filled 2016-03-05: qty 2

## 2016-03-05 MED ORDER — FENTANYL CITRATE (PF) 100 MCG/2ML IJ SOLN
INTRAMUSCULAR | Status: DC | PRN
  Administered 2016-03-05: 100 ug via INTRAVENOUS

## 2016-03-05 MED ORDER — ROCURONIUM BROMIDE 100 MG/10ML IV SOLN
INTRAVENOUS | Status: DC | PRN
  Administered 2016-03-05: 30 mg via INTRAVENOUS

## 2016-03-05 MED ORDER — CHLORHEXIDINE GLUCONATE CLOTH 2 % EX PADS: 6.0000 | MEDICATED_PAD | Freq: Once | CUTANEOUS | Status: DC

## 2016-03-05 MED ORDER — OXYCODONE HCL 5 MG PO TABS
ORAL_TABLET | ORAL | Status: AC
  Filled 2016-03-05: qty 1

## 2016-03-05 MED ORDER — PROPOFOL 10 MG/ML IV BOLUS
INTRAVENOUS | Status: DC | PRN
  Administered 2016-03-05: 140 mg via INTRAVENOUS

## 2016-03-05 MED ORDER — FENTANYL CITRATE (PF) 100 MCG/2ML IJ SOLN
25.0000 ug | INTRAMUSCULAR | Status: DC | PRN
  Administered 2016-03-05: 25 ug via INTRAVENOUS

## 2016-03-05 MED ORDER — CEFAZOLIN SODIUM-DEXTROSE 2-4 GM/100ML-% IV SOLN
INTRAVENOUS | Status: AC
  Filled 2016-03-05: qty 100

## 2016-03-05 MED ORDER — CEFAZOLIN SODIUM-DEXTROSE 2-4 GM/100ML-% IV SOLN
2.0000 g | INTRAVENOUS | Status: AC
  Administered 2016-03-05: 2 g via INTRAVENOUS

## 2016-03-05 MED ORDER — ONDANSETRON HCL 4 MG/2ML IJ SOLN
INTRAMUSCULAR | Status: DC | PRN
  Administered 2016-03-05: 4 mg via INTRAVENOUS

## 2016-03-05 MED ORDER — SODIUM CHLORIDE 0.9 % IV SOLN: 250.0000 mL | INTRAVENOUS | Status: DC | PRN

## 2016-03-05 MED ORDER — EPHEDRINE SULFATE 50 MG/ML IJ SOLN
INTRAMUSCULAR | Status: DC | PRN
  Administered 2016-03-05: 10 mg via INTRAVENOUS

## 2016-03-05 MED ORDER — MEPERIDINE HCL 25 MG/ML IJ SOLN: 6.2500 mg | INTRAMUSCULAR | Status: DC | PRN

## 2016-03-05 MED ORDER — MORPHINE SULFATE (PF) 2 MG/ML IV SOLN: 2.0000 mg | INTRAVENOUS | Status: DC | PRN

## 2016-03-05 MED ORDER — METOCLOPRAMIDE HCL 5 MG/ML IJ SOLN: 10.0000 mg | Freq: Once | INTRAMUSCULAR | Status: DC | PRN

## 2016-03-05 MED ORDER — LACTATED RINGERS IV SOLN
INTRAVENOUS | Status: DC
  Administered 2016-03-05: 12:00:00 via INTRAVENOUS

## 2016-03-05 MED ORDER — SODIUM CHLORIDE 0.9% FLUSH: 3.0000 mL | Freq: Two times a day (BID) | INTRAVENOUS | Status: DC

## 2016-03-05 MED ORDER — ACETAMINOPHEN 325 MG PO TABS
650.0000 mg | ORAL_TABLET | ORAL | Status: DC | PRN
  Filled 2016-03-05: qty 2

## 2016-03-05 MED ORDER — BUPIVACAINE-EPINEPHRINE 0.25% -1:200000 IJ SOLN
INTRAMUSCULAR | Status: DC | PRN
  Administered 2016-03-05: 10 mL

## 2016-03-05 MED ORDER — ACETAMINOPHEN 650 MG RE SUPP
650.0000 mg | RECTAL | Status: DC | PRN
  Filled 2016-03-05: qty 1

## 2016-03-05 MED ORDER — OXYCODONE HCL 5 MG PO TABS
5.0000 mg | ORAL_TABLET | ORAL | Status: DC | PRN
  Administered 2016-03-05: 5 mg via ORAL

## 2016-03-05 MED ORDER — OXYCODONE-ACETAMINOPHEN 5-325 MG PO TABS: 1.0000 | ORAL_TABLET | ORAL | 0 refills | Status: DC | PRN

## 2016-03-05 SURGICAL SUPPLY — 36 items
ADH SKN CLS APL DERMABOND .7 (GAUZE/BANDAGES/DRESSINGS) ×1
BLADE SURG 10 STRL SS (BLADE) ×3 IMPLANT
BLADE SURG 15 STRL LF DISP TIS (BLADE) ×1 IMPLANT
BLADE SURG 15 STRL SS (BLADE) ×3
BLADE SURG ROTATE 9660 (MISCELLANEOUS) ×2 IMPLANT
CHLORAPREP W/TINT 26ML (MISCELLANEOUS) ×3 IMPLANT
COVER SURGICAL LIGHT HANDLE (MISCELLANEOUS) ×3 IMPLANT
DERMABOND ADVANCED (GAUZE/BANDAGES/DRESSINGS) ×2
DERMABOND ADVANCED .7 DNX12 (GAUZE/BANDAGES/DRESSINGS) IMPLANT
DRAPE LAPAROTOMY 100X72 PEDS (DRAPES) IMPLANT
DRAPE ORTHO SPLIT 77X108 STRL (DRAPES)
DRAPE SURG ORHT 6 SPLT 77X108 (DRAPES) IMPLANT
ELECT CAUTERY BLADE 6.4 (BLADE) ×3 IMPLANT
ELECT REM PT RETURN 9FT ADLT (ELECTROSURGICAL) ×3
ELECTRODE REM PT RTRN 9FT ADLT (ELECTROSURGICAL) ×1 IMPLANT
GAUZE SPONGE 4X4 12PLY STRL (GAUZE/BANDAGES/DRESSINGS) IMPLANT
GLOVE BIO SURGEON STRL SZ7.5 (GLOVE) ×3 IMPLANT
GLOVE BIOGEL PI IND STRL 8 (GLOVE) ×1 IMPLANT
GLOVE BIOGEL PI INDICATOR 8 (GLOVE) ×2
GOWN STRL REUS W/ TWL LRG LVL3 (GOWN DISPOSABLE) ×1 IMPLANT
GOWN STRL REUS W/ TWL XL LVL3 (GOWN DISPOSABLE) ×1 IMPLANT
GOWN STRL REUS W/TWL LRG LVL3 (GOWN DISPOSABLE) ×3
GOWN STRL REUS W/TWL XL LVL3 (GOWN DISPOSABLE) ×3
KIT BASIN OR (CUSTOM PROCEDURE TRAY) ×3 IMPLANT
KIT ROOM TURNOVER OR (KITS) ×3 IMPLANT
NS IRRIG 1000ML POUR BTL (IV SOLUTION) ×3 IMPLANT
PACK SURGICAL SETUP 50X90 (CUSTOM PROCEDURE TRAY) ×3 IMPLANT
PAD ARMBOARD 7.5X6 YLW CONV (MISCELLANEOUS) ×3 IMPLANT
PENCIL BUTTON HOLSTER BLD 10FT (ELECTRODE) ×3 IMPLANT
SPECIMEN JAR SMALL (MISCELLANEOUS) ×3 IMPLANT
SPONGE LAP 18X18 X RAY DECT (DISPOSABLE) ×3 IMPLANT
SUT MNCRL AB 4-0 PS2 18 (SUTURE) ×5 IMPLANT
SUT VIC AB 3-0 SH 18 (SUTURE) ×5 IMPLANT
TOWEL OR 17X24 6PK STRL BLUE (TOWEL DISPOSABLE) ×3 IMPLANT
TOWEL OR 17X26 10 PK STRL BLUE (TOWEL DISPOSABLE) ×3 IMPLANT
UNDERPAD 30X30 (UNDERPADS AND DIAPERS) IMPLANT

## 2016-03-05 NOTE — Anesthesia Procedure Notes (Signed)
Procedure Name: Intubation Date/Time: 03/05/2016 1:19 PM Performed by: Marena ChancyBECKNER,  S Pre-anesthesia Checklist: Patient identified, Emergency Drugs available, Suction available and Patient being monitored Patient Re-evaluated:Patient Re-evaluated prior to inductionOxygen Delivery Method: Circle System Utilized Preoxygenation: Pre-oxygenation with 100% oxygen Intubation Type: IV induction Ventilation: Mask ventilation without difficulty Laryngoscope Size: Miller and 2 Grade View: Grade II Tube type: Oral Tube size: 7.0 mm Number of attempts: 1 Airway Equipment and Method: Stylet and Oral airway Placement Confirmation: ETT inserted through vocal cords under direct vision,  positive ETCO2 and breath sounds checked- equal and bilateral Tube secured with: Tape Dental Injury: Teeth and Oropharynx as per pre-operative assessment

## 2016-03-05 NOTE — Op Note (Signed)
03/05/2016  1:47 PM  PATIENT:  Sherri DixonMattie M Mcmillan  82 y.o. female  PRE-OPERATIVE DIAGNOSIS:  Posterior scalp mass  POST-OPERATIVE DIAGNOSIS:  Posterior scalp cyst  PROCEDURE:  Procedure(s): EXCISION POSTERIOR SCALP MASS (N/A)  SURGEON:  Surgeon(s) and Role:    * Axel FillerArmando , MD - Primary  ANESTHESIA:   local and general  EBL:  Total I/O In: -  Out: 5 [Blood:5]  BLOOD ADMINISTERED:none  DRAINS: none   LOCAL MEDICATIONS USED:  BUPIVICAINE   SPECIMEN:  Source of Specimen: 2x2x2 cM SQ  Posterior scalp mass  DISPOSITION OF SPECIMEN:  PATHOLOGY  COUNTS:  YES  TOURNIQUET:  * No tourniquets in log *  DICTATION: .Dragon Dictation  After the patient was consented she was taken back to the OR and placed in supine position. Patient underwent general endotracheal anesthesia. Patient was then placed in the prone position.  Patient was prepped and draped in sterile fashion. Timeout was called all facts verified.  A 3 cm elliptical incision was made over the mass. Cautery was used to maintain hemostasis dissection was taken down circumferentially around the mass. This was excised. This measured approximately 2 x 2 x 2 cm subcutaneous tissue. We checked for hemostasis. Hemostasis was excellent at this portion.  At this time 2-0 Vicryl was used in an interrupted fashion to reapproximate the subcutaneous tissue. A 4-0 Monocryl was used to reapproximate the skin. Dermabond was placed on the incision site.  The patient tied the procedure well was taken to the recovery room in stable condition.  PLAN OF CARE: Discharge to home after PACU  PATIENT DISPOSITION:  PACU - hemodynamically stable.   Delay start of Pharmacological VTE agent (>24hrs) due to surgical blood loss or risk of bleeding: not applicable

## 2016-03-05 NOTE — Transfer of Care (Signed)
Immediate Anesthesia Transfer of Care Note  Patient: Sherri DixonMattie M Mcmillan  Procedure(s) Performed: Procedure(s): EXCISION POSTERIOR SCALP MASS (N/A)  Patient Location: PACU  Anesthesia Type:General  Level of Consciousness: awake, alert  and oriented  Airway & Oxygen Therapy: Patient Spontanous Breathing and Patient connected to nasal cannula oxygen  Post-op Assessment: Report given to RN and Post -op Vital signs reviewed and stable  Post vital signs: Reviewed and stable  Last Vitals:  Vitals:   03/05/16 1151 03/05/16 1404  BP: (!) 145/83   Pulse: (!) 54   Resp: 20   Temp: 37 C 36.2 C    Last Pain:  Vitals:   03/05/16 1206  TempSrc:   PainSc: 0-No pain      Patients Stated Pain Goal: 4 (03/05/16 1206)  Complications: No apparent anesthesia complications

## 2016-03-05 NOTE — Anesthesia Preprocedure Evaluation (Addendum)
Anesthesia Evaluation  Patient identified by MRN, date of birth, ID band Patient awake    Reviewed: Allergy & Precautions, H&P , NPO status , Patient's Chart, lab work & pertinent test results  Airway Mallampati: II  TM Distance: >3 FB Neck ROM: Full    Dental  (+) Missing   Pulmonary asthma , former smoker,    Pulmonary exam normal breath sounds clear to auscultation       Cardiovascular hypertension, Pt. on medications + Valvular Problems/Murmurs AS  Rhythm:Regular Rate:Normal + Systolic murmurs    Neuro/Psych negative neurological ROS  negative psych ROS   GI/Hepatic Neg liver ROS, GERD  Poorly Controlled,  Endo/Other  negative endocrine ROS  Renal/GU negative Renal ROS  negative genitourinary   Musculoskeletal negative musculoskeletal ROS (+)   Abdominal   Peds negative pediatric ROS (+)  Hematology  (+) anemia ,   Anesthesia Other Findings   Reproductive/Obstetrics negative OB ROS                             Anesthesia Physical  Anesthesia Plan  ASA: II  Anesthesia Plan: General   Post-op Pain Management:    Induction: Intravenous  Airway Management Planned: LMA and Oral ETT  Additional Equipment:   Intra-op Plan:   Post-operative Plan: Extubation in OR  Informed Consent: I have reviewed the patients History and Physical, chart, labs and discussed the procedure including the risks, benefits and alternatives for the proposed anesthesia with the patient or authorized representative who has indicated his/her understanding and acceptance.   Dental advisory given  Plan Discussed with: CRNA and Surgeon  Anesthesia Plan Comments:        Anesthesia Quick Evaluation

## 2016-03-05 NOTE — H&P (Signed)
History of Present Illness The patient is a 82 year old female who presents with a complaint of sebaceous cyst. Patient is a 82 year old female who is referred by Debe CoderEmily Mullen, M.D. for evaluation of a posterior head sebaceous cyst. Patient states been there for 30+ years. She states got bigger and become more discomforting. She states her been no drainage from the area.   Allergies  Aspirin EC *ANALGESICS - NonNarcotic* Propoxyphene N *ANALGESICS - OPIOID* OxyCODONE HCl *ANALGESICS - OPIOID* MILK  Medication History  AmLODIPine Besylate (10MG  Tablet, Oral) Active. Omeprazole (20MG  Capsule DR, Oral) Active. Propranolol HCl (20MG  Tablet, Oral) Active. Sucralfate (1GM Tablet, Oral) Active. Ventolin HFA (108 (90 Base)MCG/ACT Aerosol Soln, Inhalation as needed) Active. Stool Softener (100MG  Capsule, Oral) Active. Medications Reconciled  BP (!) 145/83   Pulse (!) 54   Temp 98.6 F (37 C) (Oral)   Resp 20   Ht 5' (1.524 m)   Wt 55.8 kg (123 lb)   LMP 04/26/1950   SpO2 100%   BMI 24.02 kg/m     Physical Exam  General Mental Status-Alert. General Appearance-Consistent with stated age. Hydration-Well hydrated. Voice-Normal.  Head and Neck Note: Approximately approximately 3 x 4 cm subcutaneous, posterior scalp cyst   Chest and Lung Exam Chest and lung exam reveals -quiet, even and easy respiratory effort with no use of accessory muscles and on auscultation, normal breath sounds, no adventitious sounds and normal vocal resonance. Inspection Chest Wall - Normal. Back - normal.  Cardiovascular Cardiovascular examination reveals -normal heart sounds, regular rate and rhythm with no murmurs and normal pedal pulses bilaterally.  Abdomen Inspection Inspection of the abdomen reveals - No Hernias. Skin - Scar - no surgical scars. Palpation/Percussion Palpation and Percussion of the abdomen reveal - Soft, Non Tender, No Rebound tenderness, No  Rigidity (guarding) and No hepatosplenomegaly. Auscultation Auscultation of the abdomen reveals - Bowel sounds normal.  Neurologic Neurologic evaluation reveals -alert and oriented x 3 with no impairment of recent or remote memory. Mental Status-Normal.    Assessment & Plan  SEBACEOUS CYST (L72.3) Impression: Patient is an 82 year old female with a posterior scalp sebaceous cyst 1. The patient like to proceed to the operative for excision of sebaceous cyst 2. I discussed the risks and benefits of the procedure to include but not limited to: Infection, bleeding, damage to structures, possible recurrence. Patient was understanding and wished to proceed.

## 2016-03-05 NOTE — Discharge Instructions (Signed)
Sebaceous Cyst Removal Sebaceous cyst removal is a procedure to remove a sac of oily material that forms under your skin (sebaceous cyst). Sebaceous cysts may also be called epidermoid cysts or keratin cysts. Normally, the skin secretes this oily material through a gland or a hair follicle. This type of cyst usually results when a skin gland or hair follicle becomes blocked. You may need this procedure if you have a sebaceous cyst that becomes large, uncomfortable, or infected. LET YOUR HEALTH CARE PROVIDER KNOW ABOUT:  Any allergies you have.  All medicines you are taking, including vitamins, herbs, eye drops, creams, and over-the-counter medicines.  Previous problems you or members of your family have had with the use of anesthetics.  Any blood disorders you have.  Previous surgeries you have had.  Medical conditions you have. RISKS AND COMPLICATIONS Generally, this is a safe procedure. However, problems may occur, including:  Developing another cyst.  Bleeding.  Infection.  Scarring. BEFORE THE PROCEDURE  Ask your health care provider about:  Changing or stopping your regular medicines. This is especially important if you are taking diabetes medicines or blood thinners.  Taking medicines such as aspirin and ibuprofen. These medicines can thin your blood. Do not take these medicines before your procedure if your health care provider instructs you not to.  If you have an infected cyst, you may have to take antibiotic medicines before or after the cyst removal. Take your antibiotics as directed by your health care provider. Finish all of the medicine even if you start to feel better.  Take a shower on the morning of your procedure. Your health care provider may ask you to use a germ-killing (antiseptic) soap. PROCEDURE  You will be given a medicine that numbs the area (local anesthetic).  The skin around the cyst will be cleaned with a germ-killing solution  (antiseptic).  Your health care provider will make a small surgical incision over the cyst.  The cyst will be separated from the surrounding tissues that are under your skin.  If possible, the cyst will be removed undamaged (intact).  If the cyst bursts (ruptures), it will need to be removed in pieces.  After the cyst is removed, your health care provider will control any bleeding and close the incision with small stitches (sutures). Small incisions may not need sutures, and the bleeding will be controlled by applying direct pressure with gauze.  Your health care provider may apply antibiotic ointment and a light bandage (dressing) over the incision. This procedure may vary among health care providers and hospitals. AFTER THE PROCEDURE  If your cyst ruptured during surgery, you may need to take antibiotic medicine. If you were prescribed an antibiotic medicine, finish all of it even if you start to feel better.   This information is not intended to replace advice given to you by your health care provider. Make sure you discuss any questions you have with your health care provider.   Document Released: 04/09/2000 Document Revised: 05/03/2014 Document Reviewed: 12/26/2013 Elsevier Interactive Patient Education 2016 Elsevier Inc.  

## 2016-03-06 ENCOUNTER — Encounter (HOSPITAL_COMMUNITY): Payer: Self-pay | Admitting: General Surgery

## 2016-03-08 NOTE — Anesthesia Postprocedure Evaluation (Signed)
Anesthesia Post Note  Patient: Sherri Mcmillan  Procedure(s) Performed: Procedure(s) (LRB): EXCISION POSTERIOR SCALP MASS (N/A)  Patient location during evaluation: PACU Anesthesia Type: General Level of consciousness: awake and alert Pain management: pain level controlled Vital Signs Assessment: post-procedure vital signs reviewed and stable Respiratory status: spontaneous breathing, nonlabored ventilation, respiratory function stable and patient connected to nasal cannula oxygen Cardiovascular status: blood pressure returned to baseline and stable Postop Assessment: no signs of nausea or vomiting Anesthetic complications: no    Last Vitals:  Vitals:   03/05/16 1545 03/05/16 1557  BP:  (!) 151/90  Pulse:  71  Resp:  16  Temp: 37 C     Last Pain:  Vitals:   03/05/16 1557  TempSrc:   PainSc: 0-No pain                 Phillips Groutarignan, 

## 2016-03-20 ENCOUNTER — Ambulatory Visit (HOSPITAL_COMMUNITY)
Admission: EM | Admit: 2016-03-20 | Discharge: 2016-03-20 | Disposition: A | Discharge status: Home / Self Care | Payer: Commercial Managed Care - HMO | Attending: Emergency Medicine | Admitting: Emergency Medicine

## 2016-03-20 ENCOUNTER — Encounter (HOSPITAL_COMMUNITY): Payer: Self-pay | Admitting: Emergency Medicine

## 2016-03-20 DIAGNOSIS — L089 Local infection of the skin and subcutaneous tissue, unspecified: Secondary | ICD-10-CM

## 2016-03-20 DIAGNOSIS — T148XXA Other injury of unspecified body region, initial encounter: Secondary | ICD-10-CM | POA: Diagnosis not present

## 2016-03-20 MED ORDER — DOXYCYCLINE HYCLATE 100 MG PO CAPS: 100.0000 mg | ORAL_CAPSULE | Freq: Two times a day (BID) | ORAL | 0 refills | Status: AC

## 2016-03-20 MED ORDER — OXYCODONE-ACETAMINOPHEN 5-325 MG PO TABS: 1.0000 | ORAL_TABLET | ORAL | 0 refills | Status: DC | PRN

## 2016-03-20 NOTE — ED Provider Notes (Signed)
[facility]    CSN: [id number] Arrival date & time: 03/20/16  1450     History   Chief Complaint No chief complaint on file. CC: scalp bump  HPI Sherri Mcmillan is a 82 y.o. female.   HPI  She is an 82 year old woman here for evaluation of scalp lesion.  About 2 weeks ago, she had a cystic mass removed from the nape of her neck. She states that she is having persistent pain and discomfort in that area. Describes it as a pins and needles type feeling. She does wear hats and wigs that put pressure right over the incision site.  Past Medical History:  Diagnosis Date  . Anemia 12/14/2012  . Aortic stenosis    Dx ECHO 2008 , mild and aymptomatic , needs ECHO q 3-5 yrs  . Barrett's esophagus    Demonstrated on EGD 12/2010. EGD 09/17/2012 shows inflamed GE junctional mucosa without metaplasia, dysplasia, or malignancy.  . Bronchitis    hx of  . Cataract   . Chronic anxiety    Admission to [facility] (90s or early 2000)  for 6 weeks after mother died. Complicated again by death of her sister 2010. 2012 developed hallucinations and I refused to refill controlled meds unless she see psych which she is not agreable to  . Chronic insomnia   . Chronic pain    DJD knees, back, migranes, LLE varicose veins, and LLE neuropathy.   . Depression   . DVT (deep venous thrombosis) (HCC)   . Elevated cholesterol 10/11   LDL 155. Her 10 year risk (decreasing her age to 60 as the calculator won't go to age 58) is 21% ish. If consider her non smoker (which I wouldn't even though she says 1 pak lasts 2 weeks) risk is 12% ish. So goal for LDL is 130 or 100.  Marland Kitchen Gastroparesis    Demonstrated on GES 12/2010 by Dr Dalene Seltzer  . GERD (gastroesophageal reflux disease)   . H/O hiatal hernia   . HTN (hypertension)    Controlled with 2 drug therapy  . Left leg DVT (HCC) 09/28/2012  . Migraine    sometimes    Patient Active Problem List   Diagnosis Date Noted  . Chronic bronchitis (HCC) 08/14/2015  .  Allergic rhinitis 07/03/2015  . Cough 03/01/2014  . Osteoporosis 02/19/2014  . Pulmonary nodule 02/10/2014  . Headache 12/06/2013  . S/P Nissen fundoplication (without gastrostomy tube) procedure 05/02/2013  . GERD (gastroesophageal reflux disease) 08/03/2010  . Hyperlipidemia   . Anxiety and depression   . HTN (hypertension)   . Aortic stenosis   . Epidermiod Cyst 03/12/2008  . Insomnia 07/27/2006  . Chronic venous insufficiency 07/06/2006    Past Surgical History:  Procedure Laterality Date  . ABDOMINAL HYSTERECTOMY    . APPENDECTOMY    . CATARACT EXTRACTION     left eye  . COLONOSCOPY  2000&2005   Dr.Magod  . DIRECT LARYNGOSCOPY   September 2008    preoperative diagnosis hoarseness with anterior right vocal cord lesion -  direct laryngoscopy and excisional biopsy of right anterior vocal cord lesion done by Dr. Ezzard Standing  . ENDOVENOUS ABLATION SAPHENOUS VEIN W/ LASER Left 05-09-2014   EVLA left small saphenous vein by Gretta Began MD  . ESOPHAGOGASTRODUODENOSCOPY  11/2010  . ESOPHAGOGASTRODUODENOSCOPY N/A 09/17/2012   Procedure: ESOPHAGOGASTRODUODENOSCOPY (EGD);  Surgeon: Hart Carwin, MD;  Location: [facility] ENDOSCOPY;  Service: Endoscopy;  Laterality: N/A;  . ESOPHAGOGASTRODUODENOSCOPY N/A 12/17/2012   Procedure: ESOPHAGOGASTRODUODENOSCOPY (  EGD);  Surgeon: Beverley FiedlerJay M Pyrtle, MD;  [facility];  Service: Gastroenterology;  Laterality: N/A;  . EXCISION MASS HEAD N/A 03/05/2016   Procedure: EXCISION POSTERIOR SCALP MASS;  Surgeon: Axel FillerArmando Ramirez, MD;  Location: [facility] OR;  Service: General;  Laterality: N/A;  . EYE SURGERY    . HEMORRHOID SURGERY    . KNEE ARTHROSCOPY    . LAPAROSCOPIC NISSEN FUNDOPLICATION N/A 05/02/2013   Procedure: LAPAROSCOPIC NISSEN FUNDOPLICATION;  Surgeon: Valarie MerinoMatthew B Martin, MD;  Location: [facility] ORS;  Service: General;  Laterality: N/A;  . MENISCECTOMY   July 2002    preoperative diagnosis torn medial meniscus right knee, partial medial meniscectomy, debridement  chondroplasty patellofemoral joint, done by Dr. Madelon Lipsaffrey  . POLYPECTOMY  2000   Dr.Magod  . ROTATOR CUFF REPAIR  09/21/2011   rt shoulder    OB History    No data available       Home Medications    Prior to Admission medications   Medication Sig Start Date End Date Taking? Authorizing Provider  acetaminophen (TYLENOL) 500 MG tablet Take 1,000-1,500 mg by mouth every 6 (six) hours as needed (for pain.).    Historical Provider, MD  amLODipine (NORVASC) 10 MG tablet TAKE 1 TABLET (10 MG TOTAL) BY MOUTH DAILY WITH BREAKFAST. 10/03/15   Yolanda MangesAlex M Wilson, DO  carboxymethylcellulose (REFRESH TEARS) 0.5 % SOLN Place 1 drop into both eyes 3 (three) times daily as needed (for eye irritation).    Historical Provider, MD  citalopram (CELEXA) 20 MG tablet TAKE 1 TABLET (20 MG TOTAL) BY MOUTH DAILY. Patient taking differently: Take 20 mg by mouth every evening. TAKE 1 TABLET (20 MG TOTAL) BY MOUTH DAILY. 11/27/15   Carolynn CommentBryan Strelow, MD  diphenhydrAMINE (ALLERGY RELIEF) 25 mg capsule Take 25-50 mg by mouth every 6 (six) hours as needed for itching or sleep (for itching/scheduled at bedtime).     Historical Provider, MD  doxycycline (VIBRAMYCIN) 100 MG capsule Take 1 capsule (100 mg total) by mouth 2 (two) times daily. 03/20/16 03/27/16  Charm RingsErin J , MD  Melatonin 1 MG CAPS Take 2 capsules (2 mg total) by mouth at bedtime. 01/01/16   Carolynn CommentBryan Strelow, MD  omeprazole (PRILOSEC) 20 MG capsule Take 1 capsule (20 mg total) by mouth 2 (two) times daily before a meal. 01/01/16   Carolynn CommentBryan Strelow, MD  oxyCODONE-acetaminophen (ROXICET) 5-325 MG tablet Take 1-2 tablets by mouth every 4 (four) hours as needed. 03/20/16   Charm RingsErin J , MD  propranolol (INDERAL) 20 MG tablet Take 1 tablet (20 mg total) by mouth 2 (two) times daily. 01/01/16 12/31/16  Carolynn CommentBryan Strelow, MD  sucralfate (CARAFATE) 1 g tablet Take 1 tablet (1 g total) by mouth 4 (four) times daily. 02/02/16 02/01/17  Carolynn CommentBryan Strelow, MD    Family History Family History  Problem  Relation Age of Onset  . Heart disease Mother   . Hypertension Mother   . Heart attack Mother   . Hypertension Father   . Heart attack Father   . Diabetes Brother   . Stroke Neg Hx   . Cancer Neg Hx   . Colon cancer Neg Hx   . Anesthesia problems Neg Hx   . Hypotension Neg Hx   . Malignant hyperthermia Neg Hx   . Pseudochol deficiency Neg Hx     Social History Social History  Substance Use Topics  . Smoking status: Former Smoker    Years: 50.00    Types: Cigarettes    Quit date: 06/01/2010  .  Smokeless tobacco: Never Used  . Alcohol use No     Comment: quit 24 years ago     Allergies   Aspirin   Review of Systems Review of Systems As in history of present illness  Physical Exam Triage Vital Signs ED Triage Vitals [03/20/16 1459]  Enc Vitals Group     BP 139/92     Pulse Rate 74     Resp 18     Temp 99.3 F (37.4 C)     Temp Source Oral     SpO2 100 %     Weight      Height      Head Circumference      Peak Flow      Pain Score 9     Pain Loc      Pain Edu?      Excl. in GC?    No data found.   Updated Vital Signs BP 139/92 (BP Location: Right Arm)   Pulse 74   Temp 99.3 F (37.4 C) (Oral)   Resp 18   LMP 04/26/1950   SpO2 100%   Visual Acuity Right Eye Distance:   Left Eye Distance:   Bilateral Distance:    Right Eye Near:   Left Eye Near:    Bilateral Near:     Physical Exam  Constitutional: She is oriented to person, place, and time. She appears well-developed and well-nourished. No distress.  Cardiovascular: Normal rate.   Pulmonary/Chest: Effort normal.  Neurological: She is alert and oriented to person, place, and time.  Skin:  Healing incision on the neck. Superior to the incision there is some swelling and tenderness. ? Fluid collection.     UC Treatments / Results  Labs (all labs ordered are listed, but only abnormal results are displayed) Labs Reviewed - No data to display  EKG  EKG Interpretation None        Radiology No results found.  Procedures .Marland Kitchen.Incision and Drainage Date/Time: 03/20/2016 3:37 PM Performed by: Charm RingsHONIG,  J Authorized by: Charm RingsHONIG,  J   Consent:    Consent obtained:  Verbal Location:    Type:  Fluid collection   Size:  1cm x 3cm   Location:  Neck   Neck location:  R posterior Pre-procedure details:    Procedure prep: Alcohol. Anesthesia (see MAR for exact dosages):    Anesthesia method:  Local infiltration   Local anesthetic:  Lidocaine 1% w/o epi Procedure type:    Complexity:  Simple Procedure details:    Incision types:  Single straight   Incision depth:  Dermal   Scalpel blade:  11   Wound management:  Probed and deloculated   Drainage:  Serous   Drainage amount:  Scant   Wound treatment:  Wound left open Post-procedure details:    Patient tolerance of procedure:  Tolerated well, no immediate complications   (including critical care time)  Medications Ordered in UC Medications - No data to display   Initial Impression / Assessment and Plan / UC Course  I have reviewed the triage vital signs and the nursing notes.  Pertinent labs & imaging results that were available during my care of the patient were reviewed by me and considered in my medical decision making (see chart for details).  Clinical Course     We'll cover with doxycycline for possible infection. Provided refill of Percocet for pain. Emphasized importance of not wearing any wigs or hats for the next 10 days. Follow-up as needed.  Final Clinical Impressions(s) / UC Diagnoses   Final diagnoses:  Wound infection    New Prescriptions Discharge Medication List as of 03/20/2016  3:18 PM    START taking these medications   Details  doxycycline (VIBRAMYCIN) 100 MG capsule Take 1 capsule (100 mg total) by mouth 2 (two) times daily., Starting Sat 03/20/2016, Until Sat 03/27/2016, Print         Charm Rings, MD 03/20/16 1539

## 2016-03-20 NOTE — ED Triage Notes (Signed)
cyst on base of head, patient had surgery on this area on 11/10.  Area has continued to bleed and is sore.  Symptoms for 2 weeks.  Patient does wear caps, wigs

## 2016-03-20 NOTE — Discharge Instructions (Signed)
The incision has become infected. Take doxycycline twice a day for 7 days. Apply warm compresses 2-3 times a day. Use the pain medicine every 4-6 hours as needed. No hats or wigs for the next 10 days. Follow-up as needed.

## 2016-03-22 ENCOUNTER — Ambulatory Visit (HOSPITAL_COMMUNITY)
Admission: EM | Admit: 2016-03-22 | Discharge: 2016-03-22 | Disposition: A | Discharge status: Home / Self Care | Payer: Commercial Managed Care - HMO | Attending: Emergency Medicine | Admitting: Emergency Medicine

## 2016-03-22 ENCOUNTER — Encounter (HOSPITAL_COMMUNITY): Payer: Self-pay | Admitting: Emergency Medicine

## 2016-03-22 DIAGNOSIS — Z5189 Encounter for other specified aftercare: Secondary | ICD-10-CM

## 2016-03-22 NOTE — ED Triage Notes (Signed)
PT had an abscess lanced two days ago. Wound edges are well approximated, no current drainage.

## 2016-03-22 NOTE — ED Provider Notes (Signed)
[facility]    CSN: [id number] Arrival date & time: 03/22/16  1140     History   Chief Complaint Chief Complaint  Patient presents with  . Wound Check    HPI Sherri Mcmillan is a 82 y.o. female.   HPI  She is an 82 year old woman here for wound recheck. She was seen here 2 days ago for swelling following a cyst removal. An I&D was done at that point that returned clear fluid. Today, she is concerned as she has still seen some bleeding from the wound. She is taking the doxycycline as prescribed.  Past Medical History:  Diagnosis Date  . Anemia 12/14/2012  . Aortic stenosis    Dx ECHO 2008 , mild and aymptomatic , needs ECHO q 3-5 yrs  . Barrett's esophagus    Demonstrated on EGD 12/2010. EGD 09/17/2012 shows inflamed GE junctional mucosa without metaplasia, dysplasia, or malignancy.  . Bronchitis    hx of  . Cataract   . Chronic anxiety    [facility] ([facility]s or early 2000)  for 6 weeks after mother died. Complicated again by death of her sister 2010. 2012 developed hallucinations and I refused to refill controlled meds unless she see psych which she is not agreable to  . Chronic insomnia   . Chronic pain    DJD knees, back, migranes, LLE varicose veins, and LLE neuropathy.   . Depression   . DVT (deep venous thrombosis) (HCC)   . Elevated cholesterol 10/11   LDL 155. Her 10 year risk (decreasing her age to [age] as the calculator won't go to age 82) is 21% ish. If consider her non smoker (which I wouldn't even though she says 1 pak lasts 2 weeks) risk is 12% ish. So goal for LDL is 130 or 100.  Marland Kitchen. Gastroparesis    Demonstrated on GES 12/2010 by Dr Dalene SeltzerKapland  . GERD (gastroesophageal reflux disease)   . H/O hiatal hernia   . HTN (hypertension)    Controlled with 2 drug therapy  . Left leg DVT (HCC) 09/28/2012  . Migraine    sometimes    Patient Active Problem List   Diagnosis Date Noted  . Chronic bronchitis (HCC) 08/14/2015  . Allergic rhinitis 07/03/2015  .  Cough 03/01/2014  . Osteoporosis 02/19/2014  . Pulmonary nodule 02/10/2014  . Headache 12/06/2013  . S/P Nissen fundoplication (without gastrostomy tube) procedure 05/02/2013  . GERD (gastroesophageal reflux disease) 08/03/2010  . Hyperlipidemia   . Anxiety and depression   . HTN (hypertension)   . Aortic stenosis   . Epidermiod Cyst 03/12/2008  . Insomnia 07/27/2006  . Chronic venous insufficiency 07/06/2006    Past Surgical History:  Procedure Laterality Date  . ABDOMINAL HYSTERECTOMY    . APPENDECTOMY    . CATARACT EXTRACTION     left eye  . COLONOSCOPY  2000&2005   Dr.Magod  . DIRECT LARYNGOSCOPY   September 2008    preoperative diagnosis hoarseness with anterior right vocal cord lesion -  direct laryngoscopy and excisional biopsy of right anterior vocal cord lesion done by Dr. Ezzard StandingNewman  . ENDOVENOUS ABLATION SAPHENOUS VEIN W/ LASER Left 05-09-2014   EVLA left small saphenous vein by Gretta Beganodd Early MD  . ESOPHAGOGASTRODUODENOSCOPY  11/2010  . ESOPHAGOGASTRODUODENOSCOPY N/A 09/17/2012   Procedure: ESOPHAGOGASTRODUODENOSCOPY (EGD);  Surgeon: Hart Carwinora M Brodie, MD;  [facility];  Service: Endoscopy;  Laterality: N/A;  . ESOPHAGOGASTRODUODENOSCOPY N/A 12/17/2012   Procedure: ESOPHAGOGASTRODUODENOSCOPY (EGD);  Surgeon: Beverley FiedlerJay M Pyrtle,  MD;  Location: [facility];  Service: Gastroenterology;  Laterality: N/A;  . EXCISION MASS HEAD N/A 03/05/2016   Procedure: EXCISION POSTERIOR SCALP MASS;  Surgeon: Axel FillerArmando Ramirez, MD;  Location: [facility];  Service: General;  Laterality: N/A;  . EYE SURGERY    . HEMORRHOID SURGERY    . KNEE ARTHROSCOPY    . LAPAROSCOPIC NISSEN FUNDOPLICATION N/A 05/02/2013   Procedure: LAPAROSCOPIC NISSEN FUNDOPLICATION;  Surgeon: Valarie MerinoMatthew B Martin, MD;  Location: [facility];  Service: General;  Laterality: N/A;  . MENISCECTOMY   July 2002    preoperative diagnosis torn medial meniscus right knee, partial medial meniscectomy, debridement chondroplasty patellofemoral joint,  done by Dr. Madelon Lipsaffrey  . POLYPECTOMY  2000   Dr.Magod  . ROTATOR CUFF REPAIR  09/21/2011   rt shoulder    OB History    No data available       Home Medications    Prior to Admission medications   Medication Sig Start Date End Date Taking? Authorizing Provider  acetaminophen (TYLENOL) 500 MG tablet Take 1,000-1,500 mg by mouth every 6 (six) hours as needed (for pain.).    Historical Provider, MD  amLODipine (NORVASC) 10 MG tablet TAKE 1 TABLET (10 MG TOTAL) BY MOUTH DAILY WITH BREAKFAST. 10/03/15   Yolanda MangesAlex M Wilson, DO  carboxymethylcellulose (REFRESH TEARS) 0.5 % SOLN Place 1 drop into both eyes 3 (three) times daily as needed (for eye irritation).    Historical Provider, MD  citalopram (CELEXA) 20 MG tablet TAKE 1 TABLET (20 MG TOTAL) BY MOUTH DAILY. Patient taking differently: Take 20 mg by mouth every evening. TAKE 1 TABLET (20 MG TOTAL) BY MOUTH DAILY. 11/27/15   Carolynn CommentBryan Strelow, MD  diphenhydrAMINE (ALLERGY RELIEF) 25 mg capsule Take 25-50 mg by mouth every 6 (six) hours as needed for itching or sleep (for itching/scheduled at bedtime).     Historical Provider, MD  doxycycline (VIBRAMYCIN) 100 MG capsule Take 1 capsule (100 mg total) by mouth 2 (two) times daily. 03/20/16 03/27/16  Charm RingsErin J Honig, MD  Melatonin 1 MG CAPS Take 2 capsules (2 mg total) by mouth at bedtime. 01/01/16   Carolynn CommentBryan Strelow, MD  omeprazole (PRILOSEC) 20 MG capsule Take 1 capsule (20 mg total) by mouth 2 (two) times daily before a meal. 01/01/16   Carolynn CommentBryan Strelow, MD  oxyCODONE-acetaminophen (ROXICET) 5-325 MG tablet Take 1-2 tablets by mouth every 4 (four) hours as needed. 03/20/16   Charm RingsErin J Honig, MD  propranolol (INDERAL) 20 MG tablet Take 1 tablet (20 mg total) by mouth 2 (two) times daily. 01/01/16 12/31/16  Carolynn CommentBryan Strelow, MD  sucralfate (CARAFATE) 1 g tablet Take 1 tablet (1 g total) by mouth 4 (four) times daily. 02/02/16 02/01/17  Carolynn CommentBryan Strelow, MD    Family History Family History  Problem Relation Age of Onset  . Heart  disease Mother   . Hypertension Mother   . Heart attack Mother   . Hypertension Father   . Heart attack Father   . Diabetes Brother   . Stroke Neg Hx   . Cancer Neg Hx   . Colon cancer Neg Hx   . Anesthesia problems Neg Hx   . Hypotension Neg Hx   . Malignant hyperthermia Neg Hx   . Pseudochol deficiency Neg Hx     Social History Social History  Substance Use Topics  . Smoking status: Former Smoker    Years: 50.00    Types: Cigarettes    Quit date: 06/01/2010  . Smokeless tobacco: Never Used  .  Alcohol use No     Comment: quit 24 years ago     Allergies   Aspirin   Review of Systems Review of Systems As in history of present illness  Physical Exam Triage Vital Signs ED Triage Vitals [03/22/16 1258]  Enc Vitals Group     BP 181/89     Pulse Rate 66     Resp 16     Temp 98.7 F (37.1 C)     Temp Source Oral     SpO2 98 %     Weight      Height      Head Circumference      Peak Flow      Pain Score      Pain Loc      Pain Edu?      Excl. in GC?    No data found.   Updated Vital Signs BP 181/89 (BP Location: Right Arm)   Pulse 66   Temp 98.7 F (37.1 C) (Oral)   Resp 16   LMP 04/26/1950   SpO2 98%   Visual Acuity Right Eye Distance:   Left Eye Distance:   Bilateral Distance:    Right Eye Near:   Left Eye Near:    Bilateral Near:     Physical Exam  Constitutional: She is oriented to person, place, and time. She appears well-developed and well-nourished. No distress.  Cardiovascular: Normal rate.   Neurological: She is alert and oriented to person, place, and time.  Skin:  I&D incision on the neck is healing well. Puffiness above the incision is less today compared to 2 days ago. She continues to have some slight tenderness above the surgical incision.     UC Treatments / Results  Labs (all labs ordered are listed, but only abnormal results are displayed) Labs Reviewed - No data to display  EKG  EKG Interpretation None        Radiology No results found.  Procedures Procedures (including critical care time)  Medications Ordered in UC Medications - No data to display   Initial Impression / Assessment and Plan / UC Course  I have reviewed the triage vital signs and the nursing notes.  Pertinent labs & imaging results that were available during my care of the patient were reviewed by me and considered in my medical decision making (see chart for details).  Clinical Course     Reassurance provided. She will finish the course of doxycycline. She has a postop visit with her surgeon on Thursday.  Final Clinical Impressions(s) / UC Diagnoses   Final diagnoses:  Visit for wound check    New Prescriptions New Prescriptions   No medications on file     Charm Rings, MD 03/22/16 1318

## 2016-03-22 NOTE — ED Notes (Signed)
Wound dressed with gauze and tape.

## 2016-03-22 NOTE — Discharge Instructions (Signed)
Everything is healing well. Take the bandage off tomorrow morning. You do not need any additional bandages. Keep the appointment with your surgeon on Thursday.

## 2016-03-31 ENCOUNTER — Telehealth: Payer: Self-pay | Admitting: Internal Medicine

## 2016-03-31 ENCOUNTER — Other Ambulatory Visit: Payer: Self-pay | Admitting: Internal Medicine

## 2016-03-31 NOTE — Telephone Encounter (Signed)
APT. REMINDER CALL, LMTCB °

## 2016-04-01 ENCOUNTER — Encounter: Payer: Self-pay | Admitting: Internal Medicine

## 2016-04-01 ENCOUNTER — Encounter: Payer: Commercial Managed Care - HMO | Admitting: Internal Medicine

## 2016-04-05 ENCOUNTER — Other Ambulatory Visit: Payer: Self-pay | Admitting: Internal Medicine

## 2016-04-05 DIAGNOSIS — K219 Gastro-esophageal reflux disease without esophagitis: Secondary | ICD-10-CM

## 2016-05-01 ENCOUNTER — Other Ambulatory Visit: Payer: Self-pay | Admitting: Internal Medicine

## 2016-05-01 DIAGNOSIS — K219 Gastro-esophageal reflux disease without esophagitis: Secondary | ICD-10-CM

## 2016-05-10 ENCOUNTER — Ambulatory Visit (INDEPENDENT_AMBULATORY_CARE_PROVIDER_SITE_OTHER): Payer: Medicare HMO

## 2016-05-10 ENCOUNTER — Encounter (HOSPITAL_COMMUNITY): Payer: Self-pay | Admitting: Emergency Medicine

## 2016-05-10 ENCOUNTER — Ambulatory Visit (HOSPITAL_COMMUNITY)
Admission: EM | Admit: 2016-05-10 | Discharge: 2016-05-10 | Disposition: A | Discharge status: Home / Self Care | Payer: Medicare HMO | Attending: Emergency Medicine | Admitting: Emergency Medicine

## 2016-05-10 DIAGNOSIS — J069 Acute upper respiratory infection, unspecified: Secondary | ICD-10-CM

## 2016-05-10 DIAGNOSIS — R079 Chest pain, unspecified: Secondary | ICD-10-CM | POA: Diagnosis not present

## 2016-05-10 DIAGNOSIS — R0982 Postnasal drip: Secondary | ICD-10-CM

## 2016-05-10 DIAGNOSIS — R059 Cough, unspecified: Secondary | ICD-10-CM

## 2016-05-10 DIAGNOSIS — J4541 Moderate persistent asthma with (acute) exacerbation: Secondary | ICD-10-CM

## 2016-05-10 DIAGNOSIS — R05 Cough: Secondary | ICD-10-CM

## 2016-05-10 DIAGNOSIS — D649 Anemia, unspecified: Secondary | ICD-10-CM

## 2016-05-10 LAB — POCT I-STAT, CHEM 8
BUN: 15 mg/dL (ref 6–20)
CALCIUM ION: 1.24 mmol/L (ref 1.15–1.40)
CHLORIDE: 107 mmol/L (ref 101–111)
CREATININE: 0.8 mg/dL (ref 0.44–1.00)
GLUCOSE: 85 mg/dL (ref 65–99)
HCT: 30 % — ABNORMAL LOW (ref 36.0–46.0)
Hemoglobin: 10.2 g/dL — ABNORMAL LOW (ref 12.0–15.0)
Potassium: 3.9 mmol/L (ref 3.5–5.1)
Sodium: 141 mmol/L (ref 135–145)
TCO2: 25 mmol/L (ref 0–100)

## 2016-05-10 MED ORDER — IPRATROPIUM-ALBUTEROL 0.5-2.5 (3) MG/3ML IN SOLN
RESPIRATORY_TRACT | Status: AC
  Filled 2016-05-10: qty 3

## 2016-05-10 MED ORDER — ALBUTEROL SULFATE HFA 108 (90 BASE) MCG/ACT IN AERS: 2.0000 | INHALATION_SPRAY | RESPIRATORY_TRACT | 0 refills | Status: DC | PRN

## 2016-05-10 MED ORDER — MELATONIN 1 MG PO TABS: ORAL_TABLET | ORAL | 0 refills | Status: DC

## 2016-05-10 MED ORDER — METHYLPREDNISOLONE SODIUM SUCC 125 MG IJ SOLR
INTRAMUSCULAR | Status: AC
  Filled 2016-05-10: qty 2

## 2016-05-10 MED ORDER — IPRATROPIUM-ALBUTEROL 0.5-2.5 (3) MG/3ML IN SOLN
3.0000 mL | Freq: Once | RESPIRATORY_TRACT | Status: AC
  Administered 2016-05-10: 3 mL via RESPIRATORY_TRACT

## 2016-05-10 MED ORDER — PREDNISONE 20 MG PO TABS: ORAL_TABLET | ORAL | 0 refills | Status: DC

## 2016-05-10 MED ORDER — METHYLPREDNISOLONE SODIUM SUCC 125 MG IJ SOLR
60.0000 mg | Freq: Once | INTRAMUSCULAR | Status: AC
  Administered 2016-05-10: 125 mg via INTRAMUSCULAR

## 2016-05-10 MED ORDER — CETIRIZINE HCL 10 MG PO CAPS: ORAL_CAPSULE | ORAL | 0 refills | Status: DC

## 2016-05-10 NOTE — ED Provider Notes (Signed)
CSN: [id number]     Arrival date & time 05/10/16  1010 History   First MD Initiated Contact with Patient 05/10/16 1042     Chief Complaint  Patient presents with  . Cough   (Consider location/radiation/quality/duration/timing/severity/associated sxs/prior Treatment) 83 year old female, self-sufficient, active and sings at church states that for the past 3 days she has had a cough and some shortness of breath associated with wheezing. She has a history of asthma but she has not had a flareup recently and does not have an inhaler. She also has a cough and a couple 2 days ago she had acute episodes of bilateral, biparietal bitemporal headache that last for a few minutes at a time and spontaneously resolve. She states this was very similar to her past migraine type headaches. She was singing this weekend and states she felt weak and like she was going to pass out and had to sit down. There was no syncope. She remained fully awake and alert. Later that day she went out to eat and had facial and head pain similar to before associated with epigastric discomfort. She  also notes that  this epigastric comfort associated with cough only. Her vital signs are normal. She is fully awake and alert sitting on the edge of the bed very talkative and energetic showing no signs of distress, smiling and interactive.      Past Medical History:  Diagnosis Date  . Anemia 12/14/2012  . Aortic stenosis    Dx ECHO 2008 , mild and aymptomatic , needs ECHO q 3-5 yrs  . Barrett's esophagus    Demonstrated on EGD 12/2010. EGD 09/17/2012 shows inflamed GE junctional mucosa without metaplasia, dysplasia, or malignancy.  . Bronchitis    hx of  . Cataract   . Chronic anxiety    Admission to [facility] (90s or early 2000)  for 6 weeks after mother died. Complicated again by death of her sister 2010. 2012 developed hallucinations and I refused to refill controlled meds unless she see psych which she is not agreable to  . Chronic  insomnia   . Chronic pain    DJD knees, back, migranes, LLE varicose veins, and LLE neuropathy.   . Depression   . DVT (deep venous thrombosis) (HCC)   . Elevated cholesterol 10/11   LDL 155. Her 10 year risk (decreasing her age to 37 as the calculator won't go to age 27) is 21% ish. If consider her non smoker (which I wouldn't even though she says 1 pak lasts 2 weeks) risk is 12% ish. So goal for LDL is 130 or 100.  Marland Kitchen Gastroparesis    Demonstrated on GES 12/2010 by Dr Dalene Seltzer  . GERD (gastroesophageal reflux disease)   . H/O hiatal hernia   . HTN (hypertension)    Controlled with 2 drug therapy  . Left leg DVT (HCC) 09/28/2012  . Migraine    sometimes   Past Surgical History:  Procedure Laterality Date  . ABDOMINAL HYSTERECTOMY    . APPENDECTOMY    . CATARACT EXTRACTION     left eye  . COLONOSCOPY  2000&2005   Dr.Magod  . DIRECT LARYNGOSCOPY   September 2008    preoperative diagnosis hoarseness with anterior right vocal cord lesion -  direct laryngoscopy and excisional biopsy of right anterior vocal cord lesion done by Dr. Ezzard Standing  . ENDOVENOUS ABLATION SAPHENOUS VEIN W/ LASER Left 05-09-2014   EVLA left small saphenous vein by Gretta Began MD  . ESOPHAGOGASTRODUODENOSCOPY  11/2010  .  ESOPHAGOGASTRODUODENOSCOPY N/A 09/17/2012   Procedure: ESOPHAGOGASTRODUODENOSCOPY (EGD);  Surgeon: Hart Carwin, MD;  [facility] ENDOSCOPY;  Service: Endoscopy;  Laterality: N/A;  . ESOPHAGOGASTRODUODENOSCOPY N/A 12/17/2012   Procedure: ESOPHAGOGASTRODUODENOSCOPY (EGD);  Surgeon: Beverley Fiedler, MD;  Location: [facility] ENDOSCOPY;  Service: Gastroenterology;  Laterality: N/A;  . EXCISION MASS HEAD N/A 03/05/2016   Procedure: EXCISION POSTERIOR SCALP MASS;  Surgeon: Axel Filler, MD;  Location: [facility] OR;  Service: General;  Laterality: N/A;  . EYE SURGERY    . HEMORRHOID SURGERY    . KNEE ARTHROSCOPY    . LAPAROSCOPIC NISSEN FUNDOPLICATION N/A 05/02/2013   Procedure: LAPAROSCOPIC NISSEN FUNDOPLICATION;  Surgeon:  Valarie Merino, MD;  Location: [facility]S;  Service: General;  Laterality: N/A;  . MENISCECTOMY   July 2002    preoperative diagnosis torn medial meniscus right knee, partial medial meniscectomy, debridement chondroplasty patellofemoral joint, done by Dr. Madelon Lips  . POLYPECTOMY  2000   Dr.Magod  . ROTATOR CUFF REPAIR  09/21/2011   rt shoulder   Family History  Problem Relation Age of Onset  . Heart disease Mother   . Hypertension Mother   . Heart attack Mother   . Hypertension Father   . Heart attack Father   . Diabetes Brother   . Stroke Neg Hx   . Cancer Neg Hx   . Colon cancer Neg Hx   . Anesthesia problems Neg Hx   . Hypotension Neg Hx   . Malignant hyperthermia Neg Hx   . Pseudochol deficiency Neg Hx    Social History  Substance Use Topics  . Smoking status: Former Smoker    Years: 50.00    Types: Cigarettes    Quit date: 06/01/2010  . Smokeless tobacco: Never Used  . Alcohol use No     Comment: quit 24 years ago   OB History    No data available     Review of Systems  Constitutional: Positive for activity change. Negative for appetite change and fever.  HENT: Positive for congestion, rhinorrhea and voice change. Negative for sore throat.   Eyes: Negative.   Respiratory: Positive for cough, shortness of breath and wheezing.   Cardiovascular: Negative.  Negative for chest pain, palpitations and leg swelling.       Denies heaviness, tightness, fullness, pressure or other chest discomfort. She points to the left low medial costal margin as the source of pain and tenderness upon coughing.  Gastrointestinal: Negative.   Genitourinary: Negative.   Skin: Negative.   Neurological: Positive for headaches. Negative for dizziness, tremors, seizures, syncope, facial asymmetry, weakness and numbness.       Denies focal paresthesias or weakness. Denies loss of consciousness or syncope.  Psychiatric/Behavioral: Negative.   All other systems reviewed and are  negative.   Allergies  Aspirin  Home Medications   Prior to Admission medications   Medication Sig Start Date End Date Taking? Authorizing Provider  acetaminophen (TYLENOL) 500 MG tablet Take 1,000-1,500 mg by mouth every 6 (six) hours as needed (for pain.).    Historical Provider, MD  albuterol (PROVENTIL HFA;VENTOLIN HFA) 108 (90 Base) MCG/ACT inhaler Inhale 2 puffs into the lungs every 4 (four) hours as needed for wheezing or shortness of breath. 05/10/16   Hayden Rasmussen, NP  amLODipine (NORVASC) 10 MG tablet TAKE 1 TABLET (10 MG TOTAL) BY MOUTH DAILY WITH BREAKFAST. 04/01/16   Carolynn Comment, MD  carboxymethylcellulose (REFRESH TEARS) 0.5 % SOLN Place 1 drop into both eyes 3 (three) times daily as needed (  for eye irritation).    Historical Provider, MD  Cetirizine HCl 10 MG CAPS Take one po daily prn drainage. 05/10/16   Hayden Rasmussen, NP  citalopram (CELEXA) 20 MG tablet TAKE 1 TABLET (20 MG TOTAL) BY MOUTH DAILY. 04/06/16   Carolynn Comment, MD  diphenhydrAMINE (ALLERGY RELIEF) 25 mg capsule Take 25-50 mg by mouth every 6 (six) hours as needed for itching or sleep (for itching/scheduled at bedtime).     Historical Provider, MD  Melatonin 1 MG TABS Take 2 po q hs for rest. 05/10/16   Hayden Rasmussen, NP  omeprazole (PRILOSEC) 20 MG capsule TAKE 1 CAPSULE BY MOUTH 2 TIMES DAILY BEFORE A MEAL. 05/03/16   Carolynn Comment, MD  predniSONE (DELTASONE) 20 MG tablet Take 2 tabs daily for 3 days , 1 tab fourth day, 1 tab 5th day. Take with food. 05/10/16   Hayden Rasmussen, NP  propranolol (INDERAL) 20 MG tablet TAKE 1 TABLET (20 MG TOTAL) BY MOUTH 2 (TWO) TIMES DAILY. 04/06/16 04/06/17  Carolynn Comment, MD  propranolol (INDERAL) 20 MG tablet TAKE 1 TABLET BY MOUTH TWICE A DAY 05/03/16   Carolynn Comment, MD  sucralfate (CARAFATE) 1 g tablet Take 1 tablet (1 g total) by mouth 4 (four) times daily. 02/02/16 02/01/17  Carolynn Comment, MD   Meds Ordered and Administered this Visit   Medications  ipratropium-albuterol (DUONEB) 0.5-2.5  (3) MG/3ML nebulizer solution 3 mL (3 mLs Nebulization Given 05/10/16 1115)  methylPREDNISolone sodium succinate (SOLU-MEDROL) 125 mg/2 mL injection 60 mg (125 mg Intramuscular Given 05/10/16 1116)    BP 153/84 (BP Location: Right Arm)   Pulse 94   Temp 98.1 F (36.7 C) (Oral)   Resp 16   LMP 04/26/1950   SpO2 98%  No data found.   Physical Exam  Constitutional: She is oriented to person, place, and time. She appears well-developed and well-nourished. No distress.  HENT:  Head: Normocephalic and atraumatic.  Right Ear: External ear normal.  Left Ear: External ear normal.  Mouth/Throat: No oropharyngeal exudate.  Bilateral TMs are normal. Oropharynx with minor cobblestoning and clear PND otherwise normal. Airway widely patent.  Eyes: Conjunctivae and EOM are normal. Pupils are equal, round, and reactive to light.  Neck: Normal range of motion. Neck supple. No JVD present.  Cardiovascular: Normal rate, regular rhythm and intact distal pulses.  Exam reveals no gallop.   Murmur heard. Pulmonary/Chest: Effort normal and breath sounds normal. No respiratory distress. She exhibits tenderness.  Left lower medial costal margin tenderness near the epigastrium. This is reproducible and the patient states that palpation reproduces the same pain for which she has been having for several days. Inspiratory and expiratory phases are mildly prolonged with decreased air movement and few wheezes.  Abdominal: Soft. Bowel sounds are normal. She exhibits no distension and no mass. There is no tenderness. There is no rebound and no guarding.  Musculoskeletal: Normal range of motion. She exhibits no edema.  Lymphadenopathy:    She has no cervical adenopathy.  Neurological: She is alert and oriented to person, place, and time. No cranial nerve deficit. Coordination normal.  Skin: Skin is warm and dry. She is not diaphoretic. No erythema.  Psychiatric: She has a normal mood and affect.  Fully awake and alert,  laughing and jovial, moving all extremities, able to obey all commands and perform testing.  Nursing note and vitals reviewed.   Urgent Care Course   Clinical Course   ED ECG REPORT   Date: 05/10/2016  Rate:  71  Rhythm: normal sinus rhythm  QRS Axis: normal  Intervals: normal  ST/T Wave abnormalities: normal  Conduction Disutrbances:none  Narrative Interpretation:   Old EKG Reviewed: unchanged  I have personally reviewed the EKG tracing and agree with the computerized printout as noted.   Procedures (including critical care time)  Labs Review Labs Reviewed  POCT I-STAT, CHEM 8 - Abnormal; Notable for the following:       Result Value   Hemoglobin 10.2 (*)    HCT 30.0 (*)    All other components within normal limits   Results for orders placed or performed during the hospital encounter of 05/10/16  I-STAT, chem 8  Result Value Ref Range   Sodium 141 135 - 145 mmol/L   Potassium 3.9 3.5 - 5.1 mmol/L   Chloride 107 101 - 111 mmol/L   BUN 15 6 - 20 mg/dL   Creatinine, Ser 5.400.80 0.44 - 1.00 mg/dL   Glucose, Bld 85 65 - 99 mg/dL   Calcium, Ion 9.811.24 1.911.15 - 1.40 mmol/L   TCO2 25 0 - 100 mmol/L   Hemoglobin 10.2 (L) 12.0 - 15.0 g/dL   HCT 47.830.0 (L) 29.536.0 - 62.146.0 %     Imaging Review Dg Chest 2 View  Result Date: 05/10/2016 CLINICAL DATA:  Cough and chest pain for 4 days. EXAM: CHEST  2 VIEW COMPARISON:  PA and lateral chest 09/02/2014.  CT chest 07/08/2015. FINDINGS: Lungs are clear. Heart size is normal. Aortic atherosclerosis is seen. Small hiatal hernia is noted. No acute bony abnormality. Thoracolumbar scoliosis is identified. IMPRESSION: No acute disease. Small hiatal hernia. Atherosclerosis. Electronically Signed   By: Drusilla Kannerhomas  Dalessio M.D.   On: 05/10/2016 11:23     Visual Acuity Review  Right Eye Distance:   Left Eye Distance:   Bilateral Distance:    Right Eye Near:   Left Eye Near:    Bilateral Near:         MDM   1. Cough   2. PND (post-nasal  drip)   3. Acute upper respiratory infection   4. Moderate persistent asthma with acute exacerbation   5. Anemia, unspecified type    Post DuoNeb the patient states that she feels that she can breathe better. She is ready to go home and does not want another treatment. She will use her handheld inhaler. She is showing no signs of distress respiratory or otherwise. She is fully awake and alert, talkative and jovial. She is prescribed the medications below. Use your albuterol inhaler 2 puffs every 4-6 hours as needed for cough and wheeze. Take the prednisone tablets once a day with food as directed. Recommend taking the Zyrtec as directed on the bottle once a day for drainage. The medicine given to you for sleeping can make you drowsy and also increased risk for falls he careful when taking this medication. It is important that you follow up with your primary care doctor to help manage with your wheezing and asthma, your anemia and for sleep and resting. Meds ordered this encounter  Medications  . ipratropium-albuterol (DUONEB) 0.5-2.5 (3) MG/3ML nebulizer solution 3 mL  . methylPREDNISolone sodium succinate (SOLU-MEDROL) 125 mg/2 mL injection 60 mg  . Melatonin 1 MG TABS    Sig: Take 2 po q hs for rest.    Dispense:  30 each    Refill:  0    Order Specific Question:   Supervising Provider    Answer:   Charm RingsHONIG, ERIN J [[id number]]  .  albuterol (PROVENTIL HFA;VENTOLIN HFA) 108 (90 Base) MCG/ACT inhaler    Sig: Inhale 2 puffs into the lungs every 4 (four) hours as needed for wheezing or shortness of breath.    Dispense:  1 Inhaler    Refill:  0    Order Specific Question:   Supervising Provider    Answer:   Charm Rings [id number]  . predniSONE (DELTASONE) 20 MG tablet    Sig: Take 2 tabs daily for 3 days , 1 tab fourth day, 1 tab 5th day. Take with food.    Dispense:  8 tablet    Refill:  0    Order Specific Question:   Supervising Provider    Answer:   Charm Rings [id number]  . Cetirizine HCl 10 MG  CAPS    Sig: Take one po daily prn drainage.    Dispense:  25 capsule    Refill:  0    Order Specific Question:   Supervising Provider    Answer:   Charm Rings [[id number]]       Hayden Rasmussen, NP 05/10/16 1223    Hayden Rasmussen, NP 05/10/16 1225

## 2016-05-10 NOTE — ED Triage Notes (Signed)
[facility] a complaint of a cough and wheezing and body aches for 4 days.

## 2016-05-10 NOTE — Discharge Instructions (Signed)
Use your albuterol inhaler 2 puffs every 4-6 hours as needed for cough and wheeze. Take the prednisone tablets once a day with food as directed. Recommend taking the Zyrtec as directed on the bottle once a day for drainage. The medicine given to you for sleeping can make you drowsy and also increased risk for falls he careful when taking this medication. It is important that you follow up with your primary care doctor to help manage with your wheezing and asthma, your anemia and for sleep and resting.

## 2016-05-14 ENCOUNTER — Encounter: Payer: Self-pay | Admitting: Internal Medicine

## 2016-05-14 ENCOUNTER — Ambulatory Visit (INDEPENDENT_AMBULATORY_CARE_PROVIDER_SITE_OTHER): Payer: Medicare HMO | Admitting: Internal Medicine

## 2016-05-14 VITALS — BP 160/81 | HR 67 | Temp 97.6°F | Ht 60.0 in | Wt 129.5 lb

## 2016-05-14 DIAGNOSIS — Z833 Family history of diabetes mellitus: Secondary | ICD-10-CM

## 2016-05-14 DIAGNOSIS — Z87891 Personal history of nicotine dependence: Secondary | ICD-10-CM

## 2016-05-14 DIAGNOSIS — G44229 Chronic tension-type headache, not intractable: Secondary | ICD-10-CM | POA: Diagnosis not present

## 2016-05-14 DIAGNOSIS — F32A Depression, unspecified: Secondary | ICD-10-CM

## 2016-05-14 DIAGNOSIS — F418 Other specified anxiety disorders: Secondary | ICD-10-CM

## 2016-05-14 DIAGNOSIS — Z8249 Family history of ischemic heart disease and other diseases of the circulatory system: Secondary | ICD-10-CM

## 2016-05-14 DIAGNOSIS — F329 Major depressive disorder, single episode, unspecified: Secondary | ICD-10-CM

## 2016-05-14 DIAGNOSIS — F419 Anxiety disorder, unspecified: Secondary | ICD-10-CM

## 2016-05-14 MED ORDER — LORAZEPAM 0.5 MG PO TABS: 0.5000 mg | ORAL_TABLET | Freq: Every day | ORAL | 0 refills | Status: DC

## 2016-05-14 NOTE — Patient Instructions (Addendum)
I'm sorry to hear that your anxiety and headaches have worsened. I would like to try a short prescription of a medication called Ativan which you can take at night to help with the headaches, the anxiety, and your sleep. This medication will only be a short term solution and I will not be able to provide any refills.  I would also recommend that you use a regimen of Tylenol 1000mg  every 6 hours for the next several days do control your headache pain. If you symptoms are not improving after 7 days, please stop taking this medication and call the clinic for further instructions.

## 2016-05-14 NOTE — Progress Notes (Signed)
CC: HA HPI: [name] is a 83 y.o. female with a h/o of anxiety/depression, insomnia, HTN, GERD 2/2 hiatal hernia s/p Nissen who presents for worsening of chronic headache.  Please see Problem-based charting for HPI and the status of patient's chronic medical conditions.  Past Medical History:  Diagnosis Date  . Anemia 12/14/2012  . Aortic stenosis    Dx ECHO 2008 , mild and aymptomatic , needs ECHO q 3-5 yrs  . Barrett's esophagus    Demonstrated on EGD 12/2010. EGD 09/17/2012 shows inflamed GE junctional mucosa without metaplasia, dysplasia, or malignancy.  . Bronchitis    hx of  . Cataract   . Chronic anxiety    Admission to [facility] (90s or early 2000)  for 6 weeks after mother died. Complicated again by death of her sister 2010. 2012 developed hallucinations and I refused to refill controlled meds unless she see psych which she is not agreable to  . Chronic insomnia   . Chronic pain    DJD knees, back, migranes, LLE varicose veins, and LLE neuropathy.   . Depression   . DVT (deep venous thrombosis) ([place])   . Elevated cholesterol 10/11   LDL 155. Her 10 year risk (decreasing her age to 32 as the calculator won't go to age 8) is 21% ish. If consider her non smoker (which I wouldn't even though she says 1 pak lasts 2 weeks) risk is 12% ish. So goal for LDL is 130 or 100.  Marland Kitchen Gastroparesis    Demonstrated on GES 12/2010 by Dr Karmen Stabs  . GERD (gastroesophageal reflux disease)   . H/O hiatal hernia   . HTN (hypertension)    Controlled with 2 drug therapy  . Left leg DVT ([place]) 09/28/2012  . Migraine    sometimes   Social History  Substance Use Topics  . Smoking status: Former Smoker    Years: 50.00    Types: Cigarettes    Quit date: 06/01/2010  . Smokeless tobacco: Never Used  . Alcohol use No     Comment: quit 24 years ago   Family History  Problem Relation Age of Onset  . Heart disease Mother   . Hypertension Mother   . Heart attack Mother   . Hypertension Father     . Heart attack Father   . Diabetes Brother   . Stroke Neg Hx   . Cancer Neg Hx   . Colon cancer Neg Hx   . Anesthesia problems Neg Hx   . Hypotension Neg Hx   . Malignant hyperthermia Neg Hx   . Pseudochol deficiency Neg Hx    Review of Systems: ROS in HPI. Otherwise: Review of Systems  Constitutional: Negative for chills, fever and weight loss.  Respiratory: Positive for shortness of breath. Negative for cough.   Cardiovascular: Negative for chest pain and leg swelling.  Gastrointestinal: Negative for abdominal pain, constipation, diarrhea, nausea and vomiting.  Genitourinary: Negative for dysuria, frequency and urgency.  Musculoskeletal: Negative for joint pain and myalgias.  Neurological: Positive for headaches. Negative for sensory change and focal weakness.  Psychiatric/Behavioral: The patient is nervous/anxious and has insomnia.    Physical Exam: Vitals:   05/14/16 1325  BP: (!) 160/81  Pulse: 67  Temp: 97.6 F (36.4 C)  TempSrc: Oral  SpO2: 100%  Weight: 129 lb 8 oz (58.7 kg)  Height: 5' (1.524 m)   Physical Exam  Constitutional: She appears well-developed. She is cooperative. No distress.  Neck:  Tightness and TTP  over left trapezius and SCM. Some TTP over left temporal artery.  Cardiovascular: Normal rate, regular rhythm, normal heart sounds and normal pulses.  Exam reveals no gallop.   No murmur heard. Pulmonary/Chest: Effort normal and breath sounds normal. No respiratory distress. She has no wheezes. She has no rhonchi. She has no rales. Breasts are symmetrical.  Abdominal: Soft. Bowel sounds are normal. There is no tenderness.  Musculoskeletal: She exhibits no edema.  Psychiatric: Her mood appears anxious.    Assessment & Plan:  See encounters tab for problem based medical decision making. Patient discussed with Dr. Beryle Beams  Anxiety and depression Patient reports that her anxiety is recently worsened due to significant lifestyle stressors. She  notes that she has recently had some conflict with her brother with whom she lives. She was reportedly kicked out of her house and has been forced to return due to poor weather but there is still significant conflict. She reports difficulty sleeping, poor appetite, and worsening headaches.  Patient is continuing to take her Celexa 20 mg daily, as well as her propranolol for panic attacks. She reports that she's also been taking her melatonin and Benadryl nightly without significant relief of her insomnia.  Plan: Patient has history of benzodiazepine dependence in the past, however given acute onset of symptoms I would recommend that we trial a short course of Ativan 0.5 mg to be dosed daily at bedtime. I counseled the patient that this was a short-term solution for her symptoms and would not be a medicine that I would be able to refill I also recommend consider altering her current SSRI therapy, potentially to something with improved coverage for anxiety. We will readdress this at her follow-up visit.  Headache Patient reports indicate worsening of her chronic headaches. She described these headaches as beginning in the temporal region and radiating down her neck. They are left greater than right. She denies any significant muscle pain, weakness, or other neurological deficits. She denies any changes to her vision. She reports that she has tried several over-the-counter remedies including ibuprofen and acetaminophen without relief. She has continued to take her propranolol for migraine prophylaxis but this has been insufficient to her current symptoms. She notes that her symptoms may be worse at night but this is likely due to her concurrent insomnia and anxiety which is worse in the evening.  On exam she has some muscle tightness in the left SCM and trapezius region as well as some tenderness to palpation of the left temporal artery. The patient recently finished a 5 day course of prednisone which was  given by urgent care for concern of asthma exacerbation. In light of this I have low suspicion that ESR or CRP testing would be helpful to evaluate for GCA. My overall suspicion for GCA is low given lack of other symptoms including visual changes, chest pain, or other arteritic symptoms. Her overall picture seems most consistent with worsening tension headache exacerbated by stress and anxiety.  Plan: Recommend standing acetaminophen 1000 mg every 6 hours therapy for 7 days. We'll also prescribe short course of lorazepam 0.5 mg dosed daily at bedtime. I have instructed the patient to contact our clinic if symptoms are not improving within 7 days. Patient may also benefit from referral to the headache clinic for further evaluation by neurology and possible trigger point injection. We will focus on treating underlying anxiety and insomnia as noted below.   Signed: Holley Raring, MD 05/14/2016, 2:25 PM  Pager: [phone]

## 2016-05-14 NOTE — Progress Notes (Signed)
Medicine attending: Medical history, presenting problems, physical findings, and medications, reviewed with resident physician Dr Carolynn CommentBryan Strelow on the day of the patient visit and I concur with his evaluation and management plan. Acute on chronic headache.Under increased stress last few weeks. No change in vision. No focal neuro deficits on exam. BP reasonable. We will give short term Rx for an anxiolytic. Continue current anti-depressant.

## 2016-05-14 NOTE — Assessment & Plan Note (Signed)
Patient reports that her anxiety is recently worsened due to significant lifestyle stressors. She notes that she has recently had some conflict with her brother with whom she lives. She was reportedly kicked out of her house and has been forced to return due to poor weather but there is still significant conflict. She reports difficulty sleeping, poor appetite, and worsening headaches.  Patient is continuing to take her Celexa 20 mg daily, as well as her propranolol for panic attacks. She reports that she's also been taking her melatonin and Benadryl nightly without significant relief of her insomnia.  Plan: Patient has history of benzodiazepine dependence in the past, however given acute onset of symptoms I would recommend that we trial a short course of Ativan 0.5 mg to be dosed daily at bedtime. I counseled the patient that this was a short-term solution for her symptoms and would not be a medicine that I would be able to refill I also recommend consider altering her current SSRI therapy, potentially to something with improved coverage for anxiety. We will readdress this at her follow-up visit.

## 2016-05-14 NOTE — Assessment & Plan Note (Signed)
Patient reports indicate worsening of her chronic headaches. She described these headaches as beginning in the temporal region and radiating down her neck. They are left greater than right. She denies any significant muscle pain, weakness, or other neurological deficits. She denies any changes to her vision. She reports that she has tried several over-the-counter remedies including ibuprofen and acetaminophen without relief. She has continued to take her propranolol for migraine prophylaxis but this has been insufficient to her current symptoms. She notes that her symptoms may be worse at night but this is likely due to her concurrent insomnia and anxiety which is worse in the evening.  On exam she has some muscle tightness in the left SCM and trapezius region as well as some tenderness to palpation of the left temporal artery. The patient recently finished a 5 day course of prednisone which was given by urgent care for concern of asthma exacerbation. In light of this I have low suspicion that ESR or CRP testing would be helpful to evaluate for GCA. My overall suspicion for GCA is low given lack of other symptoms including visual changes, chest pain, or other arteritic symptoms. Her overall picture seems most consistent with worsening tension headache exacerbated by stress and anxiety.  Plan: Recommend standing acetaminophen 1000 mg every 6 hours therapy for 7 days. We'll also prescribe short course of lorazepam 0.5 mg dosed daily at bedtime. I have instructed the patient to contact our clinic if symptoms are not improving within 7 days. Patient may also benefit from referral to the headache clinic for further evaluation by neurology and possible trigger point injection. We will focus on treating underlying anxiety and insomnia as noted below.

## 2016-06-09 ENCOUNTER — Encounter: Payer: Self-pay | Admitting: Internal Medicine

## 2016-06-09 ENCOUNTER — Encounter (INDEPENDENT_AMBULATORY_CARE_PROVIDER_SITE_OTHER): Payer: Self-pay

## 2016-06-09 ENCOUNTER — Ambulatory Visit (INDEPENDENT_AMBULATORY_CARE_PROVIDER_SITE_OTHER): Payer: Medicare HMO | Admitting: Internal Medicine

## 2016-06-09 DIAGNOSIS — M25562 Pain in left knee: Secondary | ICD-10-CM | POA: Insufficient documentation

## 2016-06-09 MED ORDER — MELOXICAM 7.5 MG PO TABS: 7.5000 mg | ORAL_TABLET | Freq: Every day | ORAL | 0 refills | Status: DC

## 2016-06-09 MED ORDER — KETOROLAC TROMETHAMINE 30 MG/ML IJ SOLN
30.0000 mg | Freq: Once | INTRAMUSCULAR | Status: AC
  Administered 2016-06-09: 30 mg via INTRAMUSCULAR

## 2016-06-09 NOTE — Progress Notes (Signed)
CC: left knee pain   HPI: [name] is a 83 y.o. female with PMHx of hypertension, aortic stenosis, venous insufficiency, osteoporosis  who presents to the clinic for acute left knee pain.   Patient states that 3 days ago she developed sudden onset of left medial knee pain extending superiorly just above the knee to inferiorly below the knee on the medial portion. She denies any trauma, falls, new activity. She is very active at baseline and seems and dances with her spiritual group. She has not been doing any activity out of her normal. Patient does not remember what she was doing at the time of the pain came on. Pain is throbbing and severe at a 9 out of 10. She has tried alcohol soaks and Tiger balm without relief. She has also tried Aleve and Tylenol intermittently without relief. She feels the medial portion of her knee is more swollen. She denies history of gout. She denies calf pain, lower extremity swelling, recent travel. She denies any fevers chills nausea or vomiting. She has never had any issues with her knees before. She denies history of arthritis in her knees.  Past Medical History:  Diagnosis Date  . Anemia 12/14/2012  . Aortic stenosis    Dx ECHO 2008 , mild and aymptomatic , needs ECHO q 3-5 yrs  . Barrett's esophagus    Demonstrated on EGD 12/2010. EGD 09/17/2012 shows inflamed GE junctional mucosa without metaplasia, dysplasia, or malignancy.  . Bronchitis    hx of  . Cataract   . Chronic anxiety    [facility] ([facility]s or early 2000)  for 6 weeks after mother died. Complicated again by death of her sister 2010. 2012 developed hallucinations and I refused to refill controlled meds unless she see psych which she is not agreable to  . Chronic insomnia   . Chronic pain    DJD knees, back, migranes, LLE varicose veins, and LLE neuropathy.   . Depression   . DVT (deep venous thrombosis) ([facility])   . Elevated cholesterol 10/11   LDL 155. Her 10 year risk (decreasing  her age to [age] as the calculator won't go to age 83) is 21% ish. If consider her non smoker (which I wouldn't even though she says 1 pak lasts 2 weeks) risk is 12% ish. So goal for LDL is 130 or 100.  Marland Kitchen. Gastroparesis    Demonstrated on [facility] 12/2010 by Dr Dalene SeltzerKapland  . GERD (gastroesophageal reflux disease)   . H/O hiatal hernia   . HTN (hypertension)    Controlled with 2 drug therapy  . Left leg DVT ([facility]) 09/28/2012  . Migraine    sometimes    Review of Systems: Please see pertinent ROS reviewed in HPI and problem based charting.   Physical Exam: Vitals:   06/09/16 0915  BP: (!) 149/96  Pulse: 74  Temp: 98.1 F (36.7 C)  TempSrc: Oral  SpO2: 100%  Weight: 130 lb 11.2 oz (59.3 kg)   General: Vital signs reviewed.  Patient is Elderly, well-developed and well-nourished, in mild acute distress and cooperative with exam.  Musculoskeletal: Left knee tender along the medial border extending 3 cm superior to knee and 3 cm inferior to knee. Slightly edematous. The knee joint is not grossly edematous, erythematous or warm. No tenderness on anterior or lateral or posterior palpation of the left knee joint. She is slightly tender to light touch on the medial portion, but mostly to deep palpation. Movement in any  direction is painful for her. No Baker's cyst palpated. Extremities: No lower extremity edema bilaterally, pulses symmetric and intact bilaterally. No calf tenderness  Neurological: Strength is normal and symmetric bilaterally, sensory intact to light touch bilaterally.  Skin: Warm, dry and intact. No rashes or erythema. Psychiatric: Normal mood and affect. speech and behavior is normal. Cognition and memory are normal.   Assessment & Plan:  See encounters tab for problem based medical decision making. Patient seen with Dr. Heide Spark

## 2016-06-09 NOTE — Patient Instructions (Signed)
Left knee pain, take meloxicam 7.5 mg once a day. You can also take Tylenol 500 mg every 6 hours as needed for pain. Please elevate, rest, ice your left knee. We will obtain an MRI as soon as possible. We have given you a shot of Toradol in the clinic today and you can start taking her meloxicam tonight or tomorrow. Please let us know if her symptoms are getting worse.

## 2016-06-09 NOTE — Assessment & Plan Note (Addendum)
Patient states that 3 days ago she developed sudden onset of left medial knee pain extending superiorly just above the knee to inferiorly below the knee on the medial portion. She denies any trauma, falls, new activity. She is very active at baseline and seems and dances with her spiritual group. She has not been doing any activity out of her normal. Patient does not remember what she was doing at the time of the pain came on. Pain is throbbing and severe at a 9 out of 10. She has tried alcohol soaks and Tiger balm without relief. She has also tried Aleve and Tylenol intermittently without relief. She feels the medial portion of her knee is more swollen. She denies history of gout. She denies calf pain, lower extremity swelling, recent travel. She denies any fevers chills nausea or vomiting. She has never had any issues with her knees before. She denies history of arthritis in her knees.  My differential for this patient includes MCL tear, meniscal tear, insufficiency fracture. I doubt gout or DVT given her presentation and exam patient could also have underlying osteoarthritis, but this acute presentation does not seem typical of arthritic pain. Presentation is not consistent with septic arthritis.  Plan: -Toradol 30 mg IM once -Stat MRI left knee without contrast -Meloxicam 7.5 mg once a day -Rest, ice, elevation

## 2016-06-10 NOTE — Progress Notes (Signed)
Internal Medicine Clinic Attending  Case discussed with Dr. Burns at the time of the visit.  We reviewed the resident's history and exam and pertinent patient test results.  I agree with the assessment, diagnosis, and plan of care documented in the resident's note.  

## 2016-06-17 ENCOUNTER — Other Ambulatory Visit: Payer: Self-pay | Admitting: Internal Medicine

## 2016-06-17 DIAGNOSIS — M25562 Pain in left knee: Secondary | ICD-10-CM

## 2016-06-18 ENCOUNTER — Other Ambulatory Visit: Payer: Self-pay | Admitting: *Deleted

## 2016-06-18 NOTE — Telephone Encounter (Signed)
Pt was given short supply for increased anxiety related to social stressors. She was told explicitly that this Rx would not be refilled. She has a h/o benzodiazapine dependence in the past. Rx refill declined.

## 2016-06-21 ENCOUNTER — Other Ambulatory Visit: Payer: Self-pay | Admitting: *Deleted

## 2016-06-21 DIAGNOSIS — K219 Gastro-esophageal reflux disease without esophagitis: Secondary | ICD-10-CM

## 2016-06-23 ENCOUNTER — Encounter (HOSPITAL_COMMUNITY): Payer: Self-pay | Admitting: *Deleted

## 2016-06-23 ENCOUNTER — Emergency Department (HOSPITAL_COMMUNITY): Payer: Medicare HMO

## 2016-06-23 ENCOUNTER — Other Ambulatory Visit: Payer: Self-pay | Admitting: *Deleted

## 2016-06-23 ENCOUNTER — Ambulatory Visit (INDEPENDENT_AMBULATORY_CARE_PROVIDER_SITE_OTHER)
Admission: EM | Admit: 2016-06-23 | Discharge: 2016-06-23 | Disposition: A | Discharge status: Home / Self Care | Payer: Medicare HMO | Source: Home / Self Care | Attending: Family Medicine | Admitting: Family Medicine

## 2016-06-23 ENCOUNTER — Observation Stay (HOSPITAL_COMMUNITY)
Admission: EM | Admit: 2016-06-23 | Discharge: 2016-06-25 | Disposition: A | Discharge status: Home / Self Care | Payer: Medicare HMO | Attending: Internal Medicine | Admitting: Internal Medicine

## 2016-06-23 DIAGNOSIS — I350 Nonrheumatic aortic (valve) stenosis: Secondary | ICD-10-CM | POA: Insufficient documentation

## 2016-06-23 DIAGNOSIS — Z79899 Other long term (current) drug therapy: Secondary | ICD-10-CM | POA: Insufficient documentation

## 2016-06-23 DIAGNOSIS — E559 Vitamin D deficiency, unspecified: Secondary | ICD-10-CM | POA: Diagnosis not present

## 2016-06-23 DIAGNOSIS — I10 Essential (primary) hypertension: Secondary | ICD-10-CM | POA: Diagnosis not present

## 2016-06-23 DIAGNOSIS — I700 Atherosclerosis of aorta: Secondary | ICD-10-CM | POA: Insufficient documentation

## 2016-06-23 DIAGNOSIS — R531 Weakness: Secondary | ICD-10-CM | POA: Diagnosis not present

## 2016-06-23 DIAGNOSIS — F419 Anxiety disorder, unspecified: Secondary | ICD-10-CM | POA: Insufficient documentation

## 2016-06-23 DIAGNOSIS — F329 Major depressive disorder, single episode, unspecified: Secondary | ICD-10-CM | POA: Diagnosis not present

## 2016-06-23 DIAGNOSIS — S8992XA Unspecified injury of left lower leg, initial encounter: Secondary | ICD-10-CM | POA: Diagnosis not present

## 2016-06-23 DIAGNOSIS — R4781 Slurred speech: Secondary | ICD-10-CM

## 2016-06-23 DIAGNOSIS — K210 Gastro-esophageal reflux disease with esophagitis: Secondary | ICD-10-CM | POA: Insufficient documentation

## 2016-06-23 DIAGNOSIS — R110 Nausea: Secondary | ICD-10-CM | POA: Diagnosis not present

## 2016-06-23 DIAGNOSIS — K219 Gastro-esophageal reflux disease without esophagitis: Secondary | ICD-10-CM

## 2016-06-23 DIAGNOSIS — K920 Hematemesis: Secondary | ICD-10-CM

## 2016-06-23 DIAGNOSIS — Z9119 Patient's noncompliance with other medical treatment and regimen: Secondary | ICD-10-CM | POA: Insufficient documentation

## 2016-06-23 DIAGNOSIS — R51 Headache: Secondary | ICD-10-CM | POA: Diagnosis not present

## 2016-06-23 DIAGNOSIS — R1115 Cyclical vomiting syndrome unrelated to migraine: Secondary | ICD-10-CM

## 2016-06-23 DIAGNOSIS — K5900 Constipation, unspecified: Secondary | ICD-10-CM | POA: Insufficient documentation

## 2016-06-23 DIAGNOSIS — G43A0 Cyclical vomiting, not intractable: Secondary | ICD-10-CM | POA: Insufficient documentation

## 2016-06-23 DIAGNOSIS — R270 Ataxia, unspecified: Principal | ICD-10-CM | POA: Insufficient documentation

## 2016-06-23 DIAGNOSIS — A084 Viral intestinal infection, unspecified: Secondary | ICD-10-CM | POA: Diagnosis present

## 2016-06-23 DIAGNOSIS — R9089 Other abnormal findings on diagnostic imaging of central nervous system: Secondary | ICD-10-CM | POA: Diagnosis present

## 2016-06-23 DIAGNOSIS — Z87891 Personal history of nicotine dependence: Secondary | ICD-10-CM | POA: Insufficient documentation

## 2016-06-23 DIAGNOSIS — K449 Diaphragmatic hernia without obstruction or gangrene: Secondary | ICD-10-CM | POA: Insufficient documentation

## 2016-06-23 DIAGNOSIS — R1110 Vomiting, unspecified: Secondary | ICD-10-CM | POA: Diagnosis present

## 2016-06-23 DIAGNOSIS — E785 Hyperlipidemia, unspecified: Secondary | ICD-10-CM | POA: Diagnosis not present

## 2016-06-23 DIAGNOSIS — D500 Iron deficiency anemia secondary to blood loss (chronic): Secondary | ICD-10-CM | POA: Insufficient documentation

## 2016-06-23 DIAGNOSIS — Z86718 Personal history of other venous thrombosis and embolism: Secondary | ICD-10-CM | POA: Diagnosis not present

## 2016-06-23 LAB — COMPREHENSIVE METABOLIC PANEL
ALBUMIN: 4.2 g/dL (ref 3.5–5.0)
ALK PHOS: 80 U/L (ref 38–126)
ALT: 12 U/L — AB (ref 14–54)
ANION GAP: 8 (ref 5–15)
AST: 20 U/L (ref 15–41)
BILIRUBIN TOTAL: 0.4 mg/dL (ref 0.3–1.2)
BUN: 14 mg/dL (ref 6–20)
CALCIUM: 9.5 mg/dL (ref 8.9–10.3)
CO2: 25 mmol/L (ref 22–32)
CREATININE: 0.8 mg/dL (ref 0.44–1.00)
Chloride: 105 mmol/L (ref 101–111)
GFR calc Af Amer: >60 mL/min (ref >60)
GFR calc non Af Amer: >60 mL/min (ref >60)
GLUCOSE: 88 mg/dL (ref 65–99)
Potassium: 3.9 mmol/L (ref 3.5–5.1)
SODIUM: 138 mmol/L (ref 135–145)
TOTAL PROTEIN: 7.3 g/dL (ref 6.5–8.1)

## 2016-06-23 LAB — URINALYSIS, ROUTINE W REFLEX MICROSCOPIC
BILIRUBIN URINE: NEGATIVE
Glucose, UA: NEGATIVE mg/dL
HGB URINE DIPSTICK: NEGATIVE
KETONES UR: NEGATIVE mg/dL
Leukocytes, UA: NEGATIVE
Nitrite: NEGATIVE
Protein, ur: NEGATIVE mg/dL
SPECIFIC GRAVITY, URINE: 1.009 (ref 1.005–1.030)
pH: 7 (ref 5.0–8.0)

## 2016-06-23 LAB — CBC
HCT: 28.4 % — ABNORMAL LOW (ref 36.0–46.0)
HEMOGLOBIN: 8.5 g/dL — AB (ref 12.0–15.0)
MCH: 23.2 pg — AB (ref 26.0–34.0)
MCHC: 29.9 g/dL — AB (ref 30.0–36.0)
MCV: 77.4 fL — ABNORMAL LOW (ref 78.0–100.0)
Platelets: 181 10*3/uL (ref 150–400)
RBC: 3.67 MIL/uL — ABNORMAL LOW (ref 3.87–5.11)
RDW: 16.4 % — AB (ref 11.5–15.5)
WBC: 4.4 10*3/uL (ref 4.0–10.5)

## 2016-06-23 LAB — POC OCCULT BLOOD, ED: Fecal Occult Bld: NEGATIVE

## 2016-06-23 MED ORDER — SODIUM CHLORIDE 0.9 % IV BOLUS (SEPSIS)
500.0000 mL | Freq: Once | INTRAVENOUS | Status: AC
  Administered 2016-06-24: 500 mL via INTRAVENOUS

## 2016-06-23 NOTE — ED Notes (Signed)
Patient transported to CT 

## 2016-06-23 NOTE — ED Notes (Signed)
ED Provider at bedside. 

## 2016-06-23 NOTE — ED Provider Notes (Addendum)
[facility]    CSN: [id number] Arrival date & time: 06/23/16  1733     History   Chief Complaint Chief Complaint  Patient presents with  . Emesis    HPI Sherri Mcmillan is a 83 y.o. female.   The history is provided by the patient.  Emesis  Severity:  Mild Duration:  3 days Quality:  Coffee grounds Progression:  Resolved Chronicity:  New Associated symptoms: abdominal pain and headaches   Associated symptoms: no fever     Past Medical History:  Diagnosis Date  . Anemia 12/14/2012  . Aortic stenosis    Dx ECHO 2008 , mild and aymptomatic , needs ECHO q 3-5 yrs  . Barrett's esophagus    Demonstrated on EGD 12/2010. EGD 09/17/2012 shows inflamed GE junctional mucosa without metaplasia, dysplasia, or malignancy.  . Bronchitis    hx of  . Cataract   . Chronic anxiety    Admission to [facility] (90s or early 2000)  for 6 weeks after mother died. Complicated again by death of her sister 2010. 2012 developed hallucinations and I refused to refill controlled meds unless she see psych which she is not agreable to  . Chronic insomnia   . Chronic pain    DJD knees, back, migranes, LLE varicose veins, and LLE neuropathy.   . Depression   . DVT (deep venous thrombosis) (HCC)   . Elevated cholesterol 10/11   LDL 155. Her 10 year risk (decreasing her age to 71 as the calculator won't go to age 51) is 21% ish. If consider her non smoker (which I wouldn't even though she says 1 pak lasts 2 weeks) risk is 12% ish. So goal for LDL is 130 or 100.  Marland Kitchen Gastroparesis    Demonstrated on GES 12/2010 by Dr Dalene Seltzer  . GERD (gastroesophageal reflux disease)   . H/O hiatal hernia   . HTN (hypertension)    Controlled with 2 drug therapy  . Left leg DVT (HCC) 09/28/2012  . Migraine    sometimes    Patient Active Problem List   Diagnosis Date Noted  . Acute pain of left knee 06/09/2016  . Chronic bronchitis (HCC) 08/14/2015  . Allergic rhinitis 07/03/2015  . Cough 03/01/2014  .  Osteoporosis 02/19/2014  . Pulmonary nodule 02/10/2014  . Headache 12/06/2013  . S/P Nissen fundoplication (without gastrostomy tube) procedure 05/02/2013  . GERD (gastroesophageal reflux disease) 08/03/2010  . Hyperlipidemia   . Anxiety and depression   . HTN (hypertension)   . Aortic stenosis   . Epidermiod Cyst 03/12/2008  . Insomnia 07/27/2006  . Chronic venous insufficiency 07/06/2006    Past Surgical History:  Procedure Laterality Date  . ABDOMINAL HYSTERECTOMY    . APPENDECTOMY    . CATARACT EXTRACTION     left eye  . COLONOSCOPY  2000&2005   Dr.Magod  . DIRECT LARYNGOSCOPY   September 2008    preoperative diagnosis hoarseness with anterior right vocal cord lesion -  direct laryngoscopy and excisional biopsy of right anterior vocal cord lesion done by Dr. Ezzard Standing  . ENDOVENOUS ABLATION SAPHENOUS VEIN W/ LASER Left 05-09-2014   EVLA left small saphenous vein by Gretta Began MD  . ESOPHAGOGASTRODUODENOSCOPY  11/2010  . ESOPHAGOGASTRODUODENOSCOPY N/A 09/17/2012   Procedure: ESOPHAGOGASTRODUODENOSCOPY (EGD);  Surgeon: Hart Carwin, MD;  Location[facility] ENDOSCOPY;  Service: Endoscopy;  Laterality: N/A;  . ESOPHAGOGASTRODUODENOSCOPY N/A 12/17/2012   Procedure: ESOPHAGOGASTRODUODENOSCOPY (EGD);  Surgeon: Beverley Fiedler, MD;  Location: [facility] ENDOSCOPY;  Service:  Gastroenterology;  Laterality: N/A;  . EXCISION MASS HEAD N/A 03/05/2016   Procedure: EXCISION POSTERIOR SCALP MASS;  Surgeon: Axel Filler, MD;  Location: [facility] OR;  Service: General;  Laterality: N/A;  . EYE SURGERY    . HEMORRHOID SURGERY    . KNEE ARTHROSCOPY    . LAPAROSCOPIC NISSEN FUNDOPLICATION N/A 05/02/2013   Procedure: LAPAROSCOPIC NISSEN FUNDOPLICATION;  Surgeon: Valarie Merino, MD;  Location: [facility] ORS;  Service: General;  Laterality: N/A;  . MENISCECTOMY   July 2002    preoperative diagnosis torn medial meniscus right knee, partial medial meniscectomy, debridement chondroplasty patellofemoral joint, done by Dr. Madelon Lips  .  POLYPECTOMY  2000   Dr.Magod  . ROTATOR CUFF REPAIR  09/21/2011   rt shoulder    OB History    No data available       Home Medications    Prior to Admission medications   Medication Sig Start Date End Date Taking? Authorizing Provider  acetaminophen (TYLENOL) 500 MG tablet Take 1,000-1,500 mg by mouth every 6 (six) hours as needed (for pain.).    Historical Provider, MD  albuterol (PROVENTIL HFA;VENTOLIN HFA) 108 (90 Base) MCG/ACT inhaler Inhale 2 puffs into the lungs every 4 (four) hours as needed for wheezing or shortness of breath. 05/10/16   Hayden Rasmussen, NP  amLODipine (NORVASC) 10 MG tablet TAKE 1 TABLET (10 MG TOTAL) BY MOUTH DAILY WITH BREAKFAST. 04/01/16   Carolynn Comment, MD  carboxymethylcellulose (REFRESH TEARS) 0.5 % SOLN Place 1 drop into both eyes 3 (three) times daily as needed (for eye irritation).    Historical Provider, MD  Cetirizine HCl 10 MG CAPS Take one po daily prn drainage. 05/10/16   Hayden Rasmussen, NP  citalopram (CELEXA) 20 MG tablet TAKE 1 TABLET (20 MG TOTAL) BY MOUTH DAILY. 04/06/16   Carolynn Comment, MD  diphenhydrAMINE (ALLERGY RELIEF) 25 mg capsule Take 25-50 mg by mouth every 6 (six) hours as needed for itching or sleep (for itching/scheduled at bedtime).     Historical Provider, MD  LORazepam (ATIVAN) 0.5 MG tablet Take 1 tablet (0.5 mg total) by mouth at bedtime. You may take 1 additional pill for severe anxiety during the day. 05/14/16 05/14/17  Carolynn Comment, MD  Melatonin 1 MG TABS Take 2 po q hs for rest. 05/10/16   Hayden Rasmussen, NP  Melatonin 1 MG TABS TAKE 2 TABLETS BY MOUTH AT BEDTIME. 06/18/16   Carolynn Comment, MD  meloxicam (MOBIC) 7.5 MG tablet TAKE 1 TABLET (7.5 MG TOTAL) BY MOUTH DAILY. 06/18/16   Carolynn Comment, MD  omeprazole (PRILOSEC) 20 MG capsule TAKE 1 CAPSULE BY MOUTH 2 TIMES DAILY BEFORE A MEAL. 05/03/16   Carolynn Comment, MD  predniSONE (DELTASONE) 20 MG tablet Take 2 tabs daily for 3 days , 1 tab fourth day, 1 tab 5th day. Take with food. 05/10/16   Hayden Rasmussen, NP  propranolol (INDERAL) 20 MG tablet TAKE 1 TABLET (20 MG TOTAL) BY MOUTH 2 (TWO) TIMES DAILY. 04/06/16 04/06/17  Carolynn Comment, MD  propranolol (INDERAL) 20 MG tablet TAKE 1 TABLET BY MOUTH TWICE A DAY 05/03/16   Carolynn Comment, MD  sucralfate (CARAFATE) 1 g tablet Take 1 tablet (1 g total) by mouth 4 (four) times daily. 02/02/16 02/01/17  Carolynn Comment, MD    Family History Family History  Problem Relation Age of Onset  . Heart disease Mother   . Hypertension Mother   . Heart attack Mother   . Hypertension Father   . Heart attack  Father   . Diabetes Brother   . Stroke Neg Hx   . Cancer Neg Hx   . Colon cancer Neg Hx   . Anesthesia problems Neg Hx   . Hypotension Neg Hx   . Malignant hyperthermia Neg Hx   . Pseudochol deficiency Neg Hx     Social History Social History  Substance Use Topics  . Smoking status: Former Smoker    Years: 50.00    Types: Cigarettes    Quit date: 06/01/2010  . Smokeless tobacco: Never Used  . Alcohol use No     Comment: quit 24 years ago     Allergies   Aspirin   Review of Systems Review of Systems  Constitutional: Positive for appetite change. Negative for fever.  Respiratory: Negative.   Cardiovascular: Negative.   Gastrointestinal: Positive for abdominal pain and vomiting.  Neurological: Positive for headaches.  All other systems reviewed and are negative.    Physical Exam Triage Vital Signs ED Triage Vitals  Enc Vitals Group     BP 06/23/16 1828 (!) 185/110     Pulse Rate 06/23/16 1828 (!) 58     Resp 06/23/16 1828 18     Temp 06/23/16 1828 98.6 F (37 C)     Temp Source 06/23/16 1828 Oral     SpO2 06/23/16 1828 98 %     Weight --      Height --      Head Circumference --      Peak Flow --      Pain Score 06/23/16 1830 9     Pain Loc --      Pain Edu? --      Excl. in GC? --    No data found.   Updated Vital Signs BP (!) 185/110 (BP Location: Left Arm)   Pulse (!) 58   Temp 98.6 F (37 C) (Oral)   Resp 18    LMP 04/26/1950   SpO2 98%   Visual Acuity Right Eye Distance:   Left Eye Distance:   Bilateral Distance:    Right Eye Near:   Left Eye Near:    Bilateral Near:     Physical Exam  Constitutional: She is oriented to person, place, and time. She appears well-developed and well-nourished. No distress.  HENT:  Mouth/Throat: Oropharynx is clear and moist.  Eyes: Conjunctivae and EOM are normal. Pupils are equal, round, and reactive to light.  Neck: Normal range of motion. Neck supple.  Cardiovascular: Normal rate and regular rhythm.   Pulmonary/Chest: Effort normal and breath sounds normal.  Abdominal: Soft. Bowel sounds are normal.  Lymphadenopathy:    She has no cervical adenopathy.  Neurological: She is alert and oriented to person, place, and time.  Skin: Skin is warm and dry.  Nursing note and vitals reviewed.    UC Treatments / Results  Labs (all labs ordered are listed, but only abnormal results are displayed) Labs Reviewed - No data to display  EKG  EKG Interpretation None       Radiology No results found.  Procedures Procedures (including critical care time)  Medications Ordered in UC Medications - No data to display   Initial Impression / Assessment and Plan / UC Course  I have reviewed the triage vital signs and the nursing notes.  Pertinent labs & imaging results that were available during my care of the patient were reviewed by me and considered in my medical decision making (see chart for details).  Vomiting, black fluid, hypertension , headache  Final Clinical Impressions(s) / UC Diagnoses   Final diagnoses:  Non-intractable cyclical vomiting without nausea  Hematemesis with nausea  Essential hypertension    New Prescriptions Discharge Medication List as of 06/23/2016  6:56 PM       Linna HoffJames D Kindl, MD 06/23/16 1857    Linna HoffJames D Kindl, MD 06/23/16 2024

## 2016-06-23 NOTE — ED Triage Notes (Signed)
[facility] Pt c/o headache for a week, NV x 3 days, and generalized weakness. Pt reports falling today at home due to her L knee feeling weak. Pt is ambulatory with steady gait in triage.

## 2016-06-23 NOTE — ED Triage Notes (Signed)
Pt  Is  A  Poor  Historian    But  Reports   dizzyness   Vomiting         As   Well   As  A  headcache      Pt  Reports  She  Is  noty  Taking  Her  bp  meds       As   Directed  And  Sometimes  Misses  A  Dose

## 2016-06-23 NOTE — Discharge Instructions (Signed)
Go directly to ER  

## 2016-06-23 NOTE — ED Provider Notes (Signed)
[facility]-EMERGENCY DEPT Provider Note   CSN: [id number] Arrival date & time: 06/23/16  1928 By signing my name below, I, Levon Hedger, attest that this documentation has been prepared under the direction and in the presence of Glynn Octave, MD . Electronically Signed: Levon Hedger, Scribe. 06/23/2016. 11:42 PM.   History   Chief Complaint Chief Complaint  Patient presents with  . Headache   HPI Sherri Mcmillan is a 83 y.o. female with a history of aortic stenosis, DVT and HTN who presents to the Emergency Department complaining of gradual onset, gradually worsening, intermittent frontal headache onset last week. Pt has taken OTC medication with no relief of pain. No alleviating or modifying factors noted. Pt reports associated nausea, vomiting, dizziness, generalized intermittent "pinching" pain, and slurred speech. Pt feels as if she has been slurring her speech since her headache began one week ago. She reports black emesis for three days when symptoms began last week but this has resolved. Pt was seen at urgent care today and was sent to the ED for further evaluation. Per pt, she feels unsteady on her feet intermittently, and states she fell last week and struck her left knee. She denies any diarrhea, constipation, fever, visual disturbances, CP, or abdominal pain. She denies any fall today. Pt has no other complaints or symptoms at this time.    The history is provided by the patient. No language interpreter was used.   Past Medical History:  Diagnosis Date  . Anemia 12/14/2012  . Aortic stenosis    Dx ECHO 2008 , mild and aymptomatic , needs ECHO q 3-5 yrs  . Barrett's esophagus    Demonstrated on EGD 12/2010. EGD 09/17/2012 shows inflamed GE junctional mucosa without metaplasia, dysplasia, or malignancy.  . Bronchitis    hx of  . Cataract   . Chronic anxiety    Admission to [facility] (90s or early 2000)  for 6 weeks after mother died. Complicated again by death of her sister 2010. 2012  developed hallucinations and I refused to refill controlled meds unless she see psych which she is not agreable to  . Chronic insomnia   . Chronic pain    DJD knees, back, migranes, LLE varicose veins, and LLE neuropathy.   . Depression   . DVT (deep venous thrombosis) (HCC)   . Elevated cholesterol 10/11   LDL 155. Her 10 year risk (decreasing her age to 13 as the calculator won't go to age 75) is 21% ish. If consider her non smoker (which I wouldn't even though she says 1 pak lasts 2 weeks) risk is 12% ish. So goal for LDL is 130 or 100.  Marland Kitchen Gastroparesis    Demonstrated on GES 12/2010 by Dr Dalene Seltzer  . GERD (gastroesophageal reflux disease)   . H/O hiatal hernia   . HTN (hypertension)    Controlled with 2 drug therapy  . Left leg DVT (HCC) 09/28/2012  . Migraine    sometimes    Patient Active Problem List   Diagnosis Date Noted  . Acute pain of left knee 06/09/2016  . Chronic bronchitis (HCC) 08/14/2015  . Allergic rhinitis 07/03/2015  . Cough 03/01/2014  . Osteoporosis 02/19/2014  . Pulmonary nodule 02/10/2014  . Headache 12/06/2013  . S/P Nissen fundoplication (without gastrostomy tube) procedure 05/02/2013  . GERD (gastroesophageal reflux disease) 08/03/2010  . Hyperlipidemia   . Anxiety and depression   . HTN (hypertension)   . Aortic stenosis   . Epidermiod Cyst 03/12/2008  . Insomnia 07/27/2006  .  Chronic venous insufficiency 07/06/2006    Past Surgical History:  Procedure Laterality Date  . ABDOMINAL HYSTERECTOMY    . APPENDECTOMY    . CATARACT EXTRACTION     left eye  . COLONOSCOPY  2000&2005   Dr.Magod  . DIRECT LARYNGOSCOPY   September 2008    preoperative diagnosis hoarseness with anterior right vocal cord lesion -  direct laryngoscopy and excisional biopsy of right anterior vocal cord lesion done by Dr. Ezzard Standing  . ENDOVENOUS ABLATION SAPHENOUS VEIN W/ LASER Left 05-09-2014   EVLA left small saphenous vein by Gretta Began MD  . ESOPHAGOGASTRODUODENOSCOPY   11/2010  . ESOPHAGOGASTRODUODENOSCOPY N/A 09/17/2012   Procedure: ESOPHAGOGASTRODUODENOSCOPY (EGD);  Surgeon: Hart Carwin, MD;  Location[facility] ENDOSCOPY;  Service: Endoscopy;  Laterality: N/A;  . ESOPHAGOGASTRODUODENOSCOPY N/A 12/17/2012   Procedure: ESOPHAGOGASTRODUODENOSCOPY (EGD);  Surgeon: Beverley Fiedler, MD;  Location: [facility] ENDOSCOPY;  Service: Gastroenterology;  Laterality: N/A;  . EXCISION MASS HEAD N/A 03/05/2016   Procedure: EXCISION POSTERIOR SCALP MASS;  Surgeon: Axel Filler, MD;  Location: [facility] OR;  Service: General;  Laterality: N/A;  . EYE SURGERY    . HEMORRHOID SURGERY    . KNEE ARTHROSCOPY    . LAPAROSCOPIC NISSEN FUNDOPLICATION N/A 05/02/2013   Procedure: LAPAROSCOPIC NISSEN FUNDOPLICATION;  Surgeon: Valarie Merino, MD;  Location: [facility] ORS;  Service: General;  Laterality: N/A;  . MENISCECTOMY   July 2002    preoperative diagnosis torn medial meniscus right knee, partial medial meniscectomy, debridement chondroplasty patellofemoral joint, done by Dr. Madelon Lips  . POLYPECTOMY  2000   Dr.Magod  . ROTATOR CUFF REPAIR  09/21/2011   rt shoulder    OB History    No data available      Home Medications    Prior to Admission medications   Medication Sig Start Date End Date Taking? Authorizing Provider  acetaminophen (TYLENOL) 500 MG tablet Take 1,000-1,500 mg by mouth every 6 (six) hours as needed (for pain.).    Historical Provider, MD  albuterol (PROVENTIL HFA;VENTOLIN HFA) 108 (90 Base) MCG/ACT inhaler Inhale 2 puffs into the lungs every 4 (four) hours as needed for wheezing or shortness of breath. 05/10/16   Hayden Rasmussen, NP  amLODipine (NORVASC) 10 MG tablet TAKE 1 TABLET (10 MG TOTAL) BY MOUTH DAILY WITH BREAKFAST. 04/01/16   Carolynn Comment, MD  carboxymethylcellulose (REFRESH TEARS) 0.5 % SOLN Place 1 drop into both eyes 3 (three) times daily as needed (for eye irritation).    Historical Provider, MD  Cetirizine HCl 10 MG CAPS Take one po daily prn drainage. 05/10/16   Hayden Rasmussen, NP   citalopram (CELEXA) 20 MG tablet TAKE 1 TABLET (20 MG TOTAL) BY MOUTH DAILY. 04/06/16   Carolynn Comment, MD  diphenhydrAMINE (ALLERGY RELIEF) 25 mg capsule Take 25-50 mg by mouth every 6 (six) hours as needed for itching or sleep (for itching/scheduled at bedtime).     Historical Provider, MD  LORazepam (ATIVAN) 0.5 MG tablet Take 1 tablet (0.5 mg total) by mouth at bedtime. You may take 1 additional pill for severe anxiety during the day. 05/14/16 05/14/17  Carolynn Comment, MD  Melatonin 1 MG TABS Take 2 po q hs for rest. 05/10/16   Hayden Rasmussen, NP  Melatonin 1 MG TABS TAKE 2 TABLETS BY MOUTH AT BEDTIME. 06/18/16   Carolynn Comment, MD  meloxicam (MOBIC) 7.5 MG tablet TAKE 1 TABLET (7.5 MG TOTAL) BY MOUTH DAILY. 06/18/16   Carolynn Comment, MD  omeprazole (PRILOSEC) 20 MG capsule TAKE 1  CAPSULE BY MOUTH 2 TIMES DAILY BEFORE A MEAL. 05/03/16   Carolynn Comment, MD  predniSONE (DELTASONE) 20 MG tablet Take 2 tabs daily for 3 days , 1 tab fourth day, 1 tab 5th day. Take with food. 05/10/16   Hayden Rasmussen, NP  propranolol (INDERAL) 20 MG tablet TAKE 1 TABLET (20 MG TOTAL) BY MOUTH 2 (TWO) TIMES DAILY. 04/06/16 04/06/17  Carolynn Comment, MD  propranolol (INDERAL) 20 MG tablet TAKE 1 TABLET BY MOUTH TWICE A DAY 05/03/16   Carolynn Comment, MD  sucralfate (CARAFATE) 1 g tablet Take 1 tablet (1 g total) by mouth 4 (four) times daily. 02/02/16 02/01/17  Carolynn Comment, MD    Family History Family History  Problem Relation Age of Onset  . Heart disease Mother   . Hypertension Mother   . Heart attack Mother   . Hypertension Father   . Heart attack Father   . Diabetes Brother   . Stroke Neg Hx   . Cancer Neg Hx   . Colon cancer Neg Hx   . Anesthesia problems Neg Hx   . Hypotension Neg Hx   . Malignant hyperthermia Neg Hx   . Pseudochol deficiency Neg Hx     Social History Social History  Substance Use Topics  . Smoking status: Former Smoker    Years: 50.00    Types: Cigarettes    Quit date: 06/01/2010  . Smokeless tobacco:  Never Used  . Alcohol use No     Comment: quit 24 years ago     Allergies   Aspirin   Review of Systems Review of Systems 10 systems reviewed and all are negative for acute change except as noted in the HPI.   Physical Exam Updated Vital Signs BP (!) 159/104   Pulse 78   Temp 98.6 F (37 C)   Resp 18   LMP 04/26/1950   SpO2 100%   Physical Exam  Constitutional: She is oriented to person, place, and time. She appears well-developed and well-nourished. No distress.  HENT:  Head: Normocephalic and atraumatic.  Mouth/Throat: Oropharynx is clear and moist. No oropharyngeal exudate.  Eyes: Conjunctivae and EOM are normal.  Irregular pupils from previous surgery, pale conjunctiva  Neck: Normal range of motion. Neck supple.  No meningismus.  Cardiovascular: Normal rate, regular rhythm and intact distal pulses.   Murmur heard. Pulmonary/Chest: Effort normal and breath sounds normal. No respiratory distress.  Abdominal: Soft. There is no tenderness. There is no rebound and no guarding.  Musculoskeletal: Normal range of motion. She exhibits no edema or tenderness.  Left knee appears normal. No deformity, FROM.   Neurological: She is alert and oriented to person, place, and time. No cranial nerve deficit. She exhibits normal muscle tone. Coordination abnormal.   5/5 strength throughout. CN 2-12 intact.Equal grip strength, negative Romberg. Mild dysarthria. Unsteady gait needing assistance to walk  Skin: Skin is warm.  Psychiatric: She has a normal mood and affect. Her behavior is normal.  Nursing note and vitals reviewed.  ED Treatments / Results  DIAGNOSTIC STUDIES:  Oxygen Saturation is 100% on RA, normal by my interpretation.    COORDINATION OF CARE:  11:17 PM Discussed treatment plan with pt at bedside and pt agreed to plan.  Labs (all labs ordered are listed, but only abnormal results are displayed) Labs Reviewed  COMPREHENSIVE METABOLIC PANEL - Abnormal; Notable for  the following:       Result Value   ALT 12 (*)    All other components  within normal limits  CBC - Abnormal; Notable for the following:    RBC 3.67 (*)    Hemoglobin 8.5 (*)    HCT 28.4 (*)    MCV 77.4 (*)    MCH 23.2 (*)    MCHC 29.9 (*)    RDW 16.4 (*)    All other components within normal limits  URINALYSIS, ROUTINE W REFLEX MICROSCOPIC - Abnormal; Notable for the following:    Color, Urine STRAW (*)    All other components within normal limits  PROTIME-INR  TROPONIN I  SEDIMENTATION RATE  POC OCCULT BLOOD, ED  TYPE AND SCREEN    EKG  EKG Interpretation  Date/Time:  Wednesday June 23 2016 23:00:52 EST Ventricular Rate:  65 PR Interval:    QRS Duration: 79 QT Interval:  409 QTC Calculation: 426 R Axis:   50 Text Interpretation:  Sinus rhythm No significant change was found Confirmed by Manus GunningANCOUR  MD,  [phone]([id number]) on 06/23/2016 11:03:40 PM       Radiology Ct Head Wo Contrast  Result Date: 06/23/2016 CLINICAL DATA:  Headache.  Slurred speech for 1 week. EXAM: CT HEAD WITHOUT CONTRAST TECHNIQUE: Contiguous axial images were obtained from the base of the skull through the vertex without intravenous contrast. COMPARISON:  Head CT 12/02/2012 FINDINGS: Brain: No evidence of acute infarction, hemorrhage, hydrocephalus, extra-axial collection or mass lesion/mass effect. Age related atrophy and mild chronic small vessel ischemia. Vascular: Atherosclerosis of skullbase vasculature without hyperdense vessel or abnormal calcification. Skull: Normal. Negative for fracture or focal lesion. Sinuses/Orbits: Post presumed bilateral cataract resection. Minimal mucosal thickening of the maxillary sinuses. Paranasal sinuses are otherwise clear. Maxillary air cells are well-aerated. Other: None. IMPRESSION: No acute intracranial abnormality. Electronically Signed   By: Rubye OaksMelanie  Ehinger M.D.   On: 06/23/2016 23:57   Dg Knee Complete 4 Views Left  Result Date: 06/24/2016 CLINICAL DATA:   Fall at home today with left knee weakness. EXAM: LEFT KNEE - COMPLETE 4+ VIEW COMPARISON:  None. FINDINGS: No evidence of fracture, dislocation, or joint effusion. Osteoarthritis of the patellofemoral articulation. Minimal medial tibiofemoral joint space narrowing. Faint chondrocalcinosis. Small osteochondroma arising from the proximal distal fibula is incidental. Question of soft tissue edema. IMPRESSION: 1. No acute osseous abnormality.  Mild degenerative change. 2. Question of soft tissue edema. Electronically Signed   By: Rubye OaksMelanie  Ehinger M.D.   On: 06/24/2016 00:21   Dg Abdomen Acute W/chest  Result Date: 06/24/2016 CLINICAL DATA:  Generalized weakness.  Headache and slurred speech. EXAM: DG ABDOMEN ACUTE W/ 1V CHEST COMPARISON:  Chest radiographs 05/10/2016 FINDINGS: Unchanged heart size and mediastinal contours with thoracic aortic atherosclerosis. Small hiatal hernia again seen. Mild hyperinflation is chronic. Minimal chronic bronchial thickening. No dilated bowel loops to suggest obstruction. Air and stool throughout the colon which is nondilated. No small bowel dilatation. No free intra-abdominal air. No radiopaque calculi, pelvic calcifications are consistent with phleboliths. No acute osseous abnormalities. IMPRESSION: 1. No acute abnormality in the chest or abdomen. 2. Mild chronic hyperinflation bronchial thickening. Thoracic aortic atherosclerosis. 3. No bowel obstruction or free air. Electronically Signed   By: Rubye OaksMelanie  Ehinger M.D.   On: 06/24/2016 00:25    Procedures Procedures (including critical care time)  Medications Ordered in ED Medications - No data to display   Initial Impression / Assessment and Plan / ED Course  I have reviewed the triage vital signs and the nursing notes.  Pertinent labs & imaging results that were available during my care of the patient  were reviewed by me and considered in my medical decision making (see chart for details).  Clinical Course as of Jun 24 199  Thu Jun 24, 2016  0059 Pt ambulated without problem. Discussed admission for slurred speech, dizziness, anemia.   [EH]    Clinical Course User Index [EH] Levon Hedger    Patient with multiple complaints. Sent from urgent care with a 3 day history of diffuse frontal headache that comes and goes. Had a fall last week onto her left knee but this no longer hurts her. Has felt unsteady on her feet and having questionable slurred speech. Reports nausea and vomiting last week with black emesis but none since. Denies blood in the stool. No abdominal pain.  CT head negative. Questionable slurred speech on exam. No other deficitis.  Hemoglobin 8.5 from 10 previously.  FOBT negative. Reports "black vomit" last week.  EGD in 2014 showed esophagitis and hiatal hernia.   With slurred speech, worsening anemia, dizziness, plan admission for stroke rule out as well as concern for possible GI bleed. D/w [facility] residents.  Final Clinical Impressions(s) / ED Diagnoses   Final diagnoses:  Slurred speech  Hematemesis with nausea    New Prescriptions New Prescriptions   No medications on file  I personally performed the services described in this documentation, which was scribed in my presence. The recorded information has been reviewed and is accurate.    Glynn Octave, MD 06/24/16 [phone]

## 2016-06-24 ENCOUNTER — Observation Stay (HOSPITAL_COMMUNITY): Payer: Medicare HMO

## 2016-06-24 DIAGNOSIS — A084 Viral intestinal infection, unspecified: Secondary | ICD-10-CM

## 2016-06-24 DIAGNOSIS — D500 Iron deficiency anemia secondary to blood loss (chronic): Secondary | ICD-10-CM

## 2016-06-24 DIAGNOSIS — R531 Weakness: Secondary | ICD-10-CM | POA: Diagnosis not present

## 2016-06-24 DIAGNOSIS — F419 Anxiety disorder, unspecified: Secondary | ICD-10-CM

## 2016-06-24 DIAGNOSIS — K210 Gastro-esophageal reflux disease with esophagitis: Secondary | ICD-10-CM | POA: Diagnosis not present

## 2016-06-24 DIAGNOSIS — E785 Hyperlipidemia, unspecified: Secondary | ICD-10-CM | POA: Diagnosis not present

## 2016-06-24 DIAGNOSIS — R700 Elevated erythrocyte sedimentation rate: Secondary | ICD-10-CM

## 2016-06-24 DIAGNOSIS — I10 Essential (primary) hypertension: Secondary | ICD-10-CM

## 2016-06-24 DIAGNOSIS — Z8249 Family history of ischemic heart disease and other diseases of the circulatory system: Secondary | ICD-10-CM

## 2016-06-24 DIAGNOSIS — I700 Atherosclerosis of aorta: Secondary | ICD-10-CM | POA: Diagnosis not present

## 2016-06-24 DIAGNOSIS — Z79899 Other long term (current) drug therapy: Secondary | ICD-10-CM

## 2016-06-24 DIAGNOSIS — R9089 Other abnormal findings on diagnostic imaging of central nervous system: Secondary | ICD-10-CM | POA: Diagnosis not present

## 2016-06-24 DIAGNOSIS — R270 Ataxia, unspecified: Secondary | ICD-10-CM

## 2016-06-24 DIAGNOSIS — Z87891 Personal history of nicotine dependence: Secondary | ICD-10-CM

## 2016-06-24 DIAGNOSIS — E861 Hypovolemia: Secondary | ICD-10-CM

## 2016-06-24 DIAGNOSIS — R4781 Slurred speech: Secondary | ICD-10-CM | POA: Diagnosis not present

## 2016-06-24 DIAGNOSIS — Z833 Family history of diabetes mellitus: Secondary | ICD-10-CM

## 2016-06-24 DIAGNOSIS — S8992XA Unspecified injury of left lower leg, initial encounter: Secondary | ICD-10-CM | POA: Diagnosis not present

## 2016-06-24 DIAGNOSIS — Z886 Allergy status to analgesic agent status: Secondary | ICD-10-CM

## 2016-06-24 DIAGNOSIS — K5900 Constipation, unspecified: Secondary | ICD-10-CM

## 2016-06-24 HISTORY — DX: Ataxia, unspecified: R27.0

## 2016-06-24 LAB — COMPREHENSIVE METABOLIC PANEL
ALBUMIN: 3.9 g/dL (ref 3.5–5.0)
ALT: 11 U/L — AB (ref 14–54)
AST: 18 U/L (ref 15–41)
Alkaline Phosphatase: 72 U/L (ref 38–126)
Anion gap: 10 (ref 5–15)
BUN: 11 mg/dL (ref 6–20)
CHLORIDE: 105 mmol/L (ref 101–111)
CO2: 24 mmol/L (ref 22–32)
Calcium: 9.4 mg/dL (ref 8.9–10.3)
Creatinine, Ser: 0.81 mg/dL (ref 0.44–1.00)
GFR calc Af Amer: >60 mL/min (ref >60)
GFR calc non Af Amer: >60 mL/min (ref >60)
GLUCOSE: 95 mg/dL (ref 65–99)
POTASSIUM: 3.8 mmol/L (ref 3.5–5.1)
Sodium: 139 mmol/L (ref 135–145)
Total Bilirubin: 0.4 mg/dL (ref 0.3–1.2)
Total Protein: 6.7 g/dL (ref 6.5–8.1)

## 2016-06-24 LAB — GLUCOSE, CAPILLARY: Glucose-Capillary: 85 mg/dL (ref 65–99)

## 2016-06-24 LAB — CBC
HEMATOCRIT: 29.1 % — AB (ref 36.0–46.0)
HEMOGLOBIN: 8.9 g/dL — AB (ref 12.0–15.0)
MCH: 23.7 pg — AB (ref 26.0–34.0)
MCHC: 30.6 g/dL (ref 30.0–36.0)
MCV: 77.4 fL — AB (ref 78.0–100.0)
Platelets: 159 10*3/uL (ref 150–400)
RBC: 3.76 MIL/uL — AB (ref 3.87–5.11)
RDW: 16.8 % — ABNORMAL HIGH (ref 11.5–15.5)
WBC: 3.8 10*3/uL — ABNORMAL LOW (ref 4.0–10.5)

## 2016-06-24 LAB — PROTIME-INR
INR: 1.1
INR: 1.09
INR: 1.12
PROTHROMBIN TIME: 14.1 s (ref 11.4–15.2)
PROTHROMBIN TIME: 14.5 s (ref 11.4–15.2)
Prothrombin Time: 14.2 seconds (ref 11.4–15.2)

## 2016-06-24 LAB — TROPONIN I

## 2016-06-24 LAB — SEDIMENTATION RATE: Sed Rate: 38 mm/hr — ABNORMAL HIGH (ref 0–22)

## 2016-06-24 LAB — FERRITIN: FERRITIN: 7 ng/mL — AB (ref 11–307)

## 2016-06-24 MED ORDER — FERROUS SULFATE 325 (65 FE) MG PO TABS
325.0000 mg | ORAL_TABLET | Freq: Every day | ORAL | Status: DC
  Administered 2016-06-24 – 2016-06-25 (×2): 325 mg via ORAL
  Filled 2016-06-24 (×3): qty 1

## 2016-06-24 MED ORDER — GI COCKTAIL ~~LOC~~
30.0000 mL | Freq: Once | ORAL | Status: AC
  Administered 2016-06-24: 30 mL via ORAL
  Filled 2016-06-24: qty 30

## 2016-06-24 MED ORDER — AMLODIPINE BESYLATE 10 MG PO TABS: 10.0000 mg | ORAL_TABLET | Freq: Every day | ORAL | Status: DC

## 2016-06-24 MED ORDER — CITALOPRAM HYDROBROMIDE 20 MG PO TABS
20.0000 mg | ORAL_TABLET | Freq: Every day | ORAL | Status: DC
  Administered 2016-06-24: 20 mg via ORAL
  Filled 2016-06-24: qty 1

## 2016-06-24 MED ORDER — SODIUM CHLORIDE 0.9 % IV SOLN: INTRAVENOUS | Status: DC

## 2016-06-24 MED ORDER — SENNOSIDES-DOCUSATE SODIUM 8.6-50 MG PO TABS
1.0000 | ORAL_TABLET | Freq: Every day | ORAL | Status: DC
  Administered 2016-06-24: 1 via ORAL
  Filled 2016-06-24: qty 1

## 2016-06-24 MED ORDER — ACETAMINOPHEN 650 MG RE SUPP: 650.0000 mg | Freq: Four times a day (QID) | RECTAL | Status: DC | PRN

## 2016-06-24 MED ORDER — PANTOPRAZOLE SODIUM 40 MG PO TBEC
40.0000 mg | DELAYED_RELEASE_TABLET | Freq: Two times a day (BID) | ORAL | Status: DC
  Administered 2016-06-24 – 2016-06-25 (×3): 40 mg via ORAL
  Filled 2016-06-24 (×3): qty 1

## 2016-06-24 MED ORDER — ACETAMINOPHEN 325 MG PO TABS
650.0000 mg | ORAL_TABLET | Freq: Four times a day (QID) | ORAL | Status: DC | PRN
  Administered 2016-06-24 – 2016-06-25 (×3): 650 mg via ORAL
  Filled 2016-06-24 (×4): qty 2

## 2016-06-24 MED ORDER — SODIUM CHLORIDE 0.9% FLUSH
3.0000 mL | Freq: Two times a day (BID) | INTRAVENOUS | Status: DC
  Administered 2016-06-24: 3 mL via INTRAVENOUS

## 2016-06-24 MED ORDER — PROMETHAZINE HCL 25 MG PO TABS: 12.5000 mg | ORAL_TABLET | Freq: Four times a day (QID) | ORAL | Status: DC | PRN

## 2016-06-24 MED ORDER — GABAPENTIN 600 MG PO TABS
300.0000 mg | ORAL_TABLET | Freq: Every day | ORAL | Status: DC
  Administered 2016-06-24: 300 mg via ORAL
  Filled 2016-06-24: qty 1

## 2016-06-24 MED ORDER — CITALOPRAM HYDROBROMIDE 40 MG PO TABS
40.0000 mg | ORAL_TABLET | Freq: Every day | ORAL | Status: DC
  Administered 2016-06-25: 40 mg via ORAL
  Filled 2016-06-24: qty 1

## 2016-06-24 MED ORDER — SODIUM CHLORIDE 0.9 % IV BOLUS (SEPSIS)
1000.0000 mL | Freq: Once | INTRAVENOUS | Status: AC
  Administered 2016-06-24: 1000 mL via INTRAVENOUS

## 2016-06-24 NOTE — ED Notes (Signed)
Sherri Mcmillan, [phone] (friend) please notify when/if she gets room assignment

## 2016-06-24 NOTE — Evaluation (Signed)
Occupational Therapy Evaluation Patient Details Name: Sherri Mcmillan MRN: [id number] DOB: [birth year] Today's Date: 06/24/2016    History of Present Illness 83 y.o. female with a PMH of hypertension, hyperlipidemia, diastolic dysfunction, venous insufficiency, depression/anxiety, migraine headaches and GERD and Barrett's esophogus 2/2 hiatal hernia (s/p nissen fundoplication). From 3/21-3/24 she felt nausea and had 3 episodes of black watery emesis. After this she began feeling unsteady on her feet and lightheaded when she stands from seated. She has had two falls this week which she relates to being clumsy.   Clinical Impression   Pt admitted with the above diagnoses and presents with below problem list. Pt will benefit from continued acute OT to address the below listed deficits and maximize independence with basic ADLs prior to d/c to venue below. PTA pt reports she was independent with ADLs. Pt is currently min to mod A with ADLs and functional mobility/transfers. Mod A needed due to LOB during simple dynamic standing task EOB. Pt presents with UB/LB ataxia and intermittent overall pins and needles pain; tensing up and grimacing during witnessed episode towards end of session. Pt reports falling on Sunday "I just got up and went on to church." Due to balance deficits this session do not recommend d/c today. Currently recommending [facility] SNF for rehab prior to returning home with intermittent assist. Should balance deficits improve while inpatient then will update recommendation as appropriate.     Follow Up Recommendations  SNF;Supervision/Assistance - 24 hour    Equipment Recommendations  3 in 1 bedside commode    Recommendations for Other Services PT consult     Precautions / Restrictions Precautions Precautions: Fall Restrictions Weight Bearing Restrictions: No      Mobility Bed Mobility Overal bed mobility: Needs Assistance Bed Mobility: Supine to Sit;Sit to Supine     Supine to  sit: Min guard Sit to supine: Min guard   General bed mobility comments: min guard for safety  Transfers Overall transfer level: Needs assistance Equipment used: None;Rolling walker (2 wheeled) Transfers: Sit to/from [employer] Sit to Stand: Min assist;Mod assist Stand pivot transfers: Min assist;Mod assist       General transfer comment: needs cues for hand placement with rw. unsteadiness noted. swaying outside of BOS initially, mod A to steady balance. improved with rw use.    Balance Overall balance assessment: History of Falls;Needs assistance Sitting-balance support: No upper extremity supported;Feet supported Sitting balance-Leahy Scale: Fair     Standing balance support: No upper extremity supported;Bilateral upper extremity supported;During functional activity Standing balance-Leahy Scale: Poor Standing balance comment: mod A to steady balance without rw                            ADL Overall ADL's : Needs assistance/impaired Eating/Feeding: Set up;Sitting   Grooming: Wash/dry hands;Standing;Minimal assistance Grooming Details (indicate cue type and reason): min A to steady balance during 1 grooming task, standing at sink. Pt using sink for external support. Upper Body Bathing: Set up;Sitting;Cueing for compensatory techniques   Lower Body Bathing: Moderate assistance;Sit to/from stand   Upper Body Dressing : Set up;Sitting   Lower Body Dressing: Moderate assistance;Sit to/from stand   Toilet Transfer: Moderate assistance;Minimal assistance;RW (3n1 over toilet)   Toileting- Clothing Manipulation and Hygiene: Set up;Sitting/lateral lean   Tub/ Shower Transfer: Tub transfer;Moderate assistance;Ambulation;Rolling walker;3 in 1   Functional mobility during ADLs: Moderate assistance General ADL Comments: Pt with unsteadiness and swaying noted upon initial stand a  first few steps toward bathroom. Mod A to steady balance to prevent a fall.  Therapist provided rw for pt to use with balance improving to min A.      Vision Baseline Vision/History: Cataracts Patient Visual Report: Blurring of vision;Other (comment) (distance blurry. reporting intermittent "floaters") Vision Assessment?: Vision impaired- to be further tested in functional context     Perception     Praxis      Pertinent Vitals/Pain Pain Assessment: Faces Faces Pain Scale: Hurts whole lot Pain Location: intermittent "all over" "like pin needles." First observed during session upon return to sitting position EOB  Pain Descriptors / Indicators: Pins and needles;Grimacing Pain Intervention(s): Monitored during session;Repositioned;Limited activity within patient's tolerance     Hand Dominance Right   Extremity/Trunk Assessment Upper Extremity Assessment Upper Extremity Assessment: Generalized weakness (BUE ataxia)   Lower Extremity Assessment Lower Extremity Assessment: Defer to PT evaluation       Communication Communication Communication: Expressive difficulties;Other (comment) (slurred speech at times)   Cognition Arousal/Alertness: Awake/alert Behavior During Therapy: WFL for tasks assessed/performed Overall Cognitive Status: Within Functional Limits for tasks assessed                     General Comments  Reported lightheadedness, worse when OOB.    Exercises       Shoulder Instructions      Home Living Family/patient expects to be discharged to:: Private residence Living Arrangements: Non-relatives/Friends Available Help at Discharge: Friend(s);Available PRN/intermittently Type of Home: House Home Access: Stairs to enter Enter[employer] CorporationEntrance Stairs-Number of Steps: 3 Entrance Stairs-Rails: None Home Layout: One level     Bathroom Shower/Tub: Tub/shower unit Shower/tub characteristics: Engineer, building servicesCurtain Bathroom Toilet: Standard Bathroom Accessibility: Yes How Accessible: Accessible via walker Home Equipment: None          Prior  Functioning/Environment Level of Independence: Independent                 OT Problem List: Decreased strength;Decreased activity tolerance;Impaired balance (sitting and/or standing);Impaired vision/perception;Decreased coordination;Decreased knowledge of use of DME or AE;Decreased knowledge of precautions;Impaired UE functional use;Pain      OT Treatment/Interventions: Self-care/ADL training;Therapeutic exercise;Neuromuscular education;DME and/or AE instruction;Therapeutic activities;Patient/family education;Balance training    OT Goals(Current goals can be found in the care plan section) Acute Rehab OT Goals Patient Stated Goal: regain independence. feel better.  OT Goal Formulation: With patient Time For Goal Achievement: 07/08/16 Potential to Achieve Goals: Good ADL Goals Pt Will Perform Grooming: with modified independence;sitting;standing Pt Will Perform Upper Body Bathing: with min guard assist;sitting Pt Will Perform Lower Body Bathing: sit to/from stand;with min guard assist Pt Will Perform Upper Body Dressing: with min guard assist;sitting Pt Will Perform Lower Body Dressing: with min guard assist;sit to/from stand Pt Will Transfer to Toilet: with min guard assist;ambulating Pt Will Perform Toileting - Clothing Manipulation and hygiene: with min guard assist;sit to/from stand Pt Will Perform Tub/Shower Transfer: with min assist;ambulating;3 in 1;rolling walker  OT Frequency: Min 2X/week   Barriers to D/C: Decreased caregiver support  lives with friend who works during the day       Co-evaluation              End of Journalist, newspaperession Equipment Utilized During Treatment: Careers adviserGait belt;Rolling walker Nurse Communication: Mobility status  Activity Tolerance: Other (comment);Patient tolerated treatment well (intermittent "pins and needles" type pain, generalized ) Patient left: in bed;with call bell/phone within reach;with bed alarm set  OT Visit Diagnosis: Unsteadiness on  feet (R26.81);Repeated falls (R29.6);Ataxia, unspecified (  R27.0)                ADL either performed or assessed with clinical judgement  Time: 1220-1247 OT Time Calculation (min): 27 min Charges:  OT General Charges $OT Visit: 1 Procedure OT Evaluation $OT Eval Low Complexity: 1 Procedure OT Treatments $Self Care/Home Management : 8-22 mins G-Codes: OT G-codes **NOT FOR INPATIENT CLASS** Functional Assessment Tool Used: AM-PAC 6 Clicks Daily Activity Functional Limitation: Self care Self Care Current Status (Z6109): At least 40 percent but less than 60 percent impaired, limited or restricted Self Care Goal Status (U0454): At least 20 percent but less than 40 percent impaired, limited or restricted     Pilar Grammes 06/24/2016, 2:51 PM

## 2016-06-24 NOTE — Care Management Obs Status (Signed)
MEDICARE OBSERVATION STATUS NOTIFICATION   Patient Details  Name: Sherri DixonMattie M Rosenzweig MRN: [id number] Date of Birth: May 17, 1932   Medicare Observation Status Notification Given:  Yes    Kermit BaloKelli F , RN 06/24/2016, 6:58 PM

## 2016-06-24 NOTE — H&P (Signed)
Internal Medicine Attending Admission Note  I saw and evaluated the patient. I reviewed the resident's note and I agree with the resident's findings and plan as documented in the resident's note.  Assessment & Plan by Problem:   Ataxia suspect due to hypovolemia from Viral gastroenteritis - Patient reports 1 week of vomiting and poor oral intake, we have ruled out an acute CVA with CT and MRI.  Given the association with her N/V I suspect this is from dehydration.  Orthostatics were normal but obtained after IVF. - PT and OT eval for dispo. - GI symptoms appear to be resolving without intervention     Hyperlipidemia - No currently on treatment, obtain fasting Lipid panel in AM.    Anxiety and depression - Agree with increasing celexa    HTN (hypertension) -Amlodipine 10mg  daily and propanolol 20mg  BID.    GERD (gastroesophageal reflux disease) with esophagitits - I susepcet this has worsened 2/2 intermitent PPI noncomplicance and recent emesis.  FOBT was negative and Hgb appears overall stable, I do not think she needs an inpatient GI evaluation at this time.  Will continue with BID PPI.    Aortic atherosclerosis (HCC) - Noted on Xray of Chest/Abd - Not currently on ASA but has history of erosive esophagitis. -Would recommend fasting lipid panel to risk stratify for stain therapy.    Iron deficiency anemia due to chronic blood loss - Ferritin of 7 c/w IDA, will need to start oral replacement, will need outpatient GI follow up.    Abnormal finding on MRI of brain - MRI today obtain to rule out CVA, no evidence of ischemic infarct but did find :  Nonspecific signal abnormality in the splenium of the corpus callosum which can be seen with seizure, metabolic imbalance, and infection. These signal abnormalities are often transient and follow-up is recommended.  Chief Complaint(s): lightheadedness  History - key components related to admission:  Briefly Sherri Mcmillan is an  83 year old female with past medical history of hypertension, hyperlipidemia, diastolic dysfunction, venous insufficiency, depression/anxiety, migraine headaches, GERD with Barrett's esophagitis and hiatal hernia who presents with lightheadedness. She reports that about one week ago she suddenly felt nauseous and began vomiting clear liquid. This caused her to have decreased oral intake. She notes that following the initial episodes with clear liquid the liquid did appear darker but was not clearly bloody, she also does not describe the emesis as coffee ground rather just dark. Following this illness she began to have episodes where she felt very unsteady on her feet and reports that she felt like she was floating. She noticed that she specifically got worse and changing positions quickly. Over the past 1-2 days her nausea and vomiting has mostly resolved however she has had a lot of pain in her throat and chest which she describes as heartburn making it painful to eat and drink liquids. She has tried to be consistent with taking most of her medications however she does miss doses. She notes that multiple of her friends were concerned that she may be having or have had a stroke and thus came in for evaluation. Today she feels slightly better however she is not back to her baseline.  Lab results: Reviewed in Epic  Physical Exam - key components related to admission:  General: resting in bed HEENT: EOMI, no scleral icterus Cardiac: RRR, systolic murmur Pulm: clear to auscultation bilaterally, moving normal volumes of air Abd: soft, epigastric tenderness Ext: warm and well perfused, no pedal edema  Neuro: alert and oriented X3, cranial nerves II-XII grossly intact  Pysch: odd affect, after telling her she did not have a stroke she began crying.  Vitals:   06/24/16 0546 06/24/16 0800 06/24/16 0948 06/24/16 1253  BP: 132/83 (!) 138/56 127/68 132/61  Pulse: 71 68 66 (!) 59  Resp: 20 16 20 18   Temp: 98.4  F (36.9 C) 97.9 F (36.6 C) 98.3 F (36.8 C) 99.5 F (37.5 C)  TempSrc: Oral Oral Oral Oral  SpO2: 100% 97% 100% 100%  Weight: 126 lb 3.2 oz (57.2 kg)     Height: 5' (1.524 m)

## 2016-06-24 NOTE — Progress Notes (Addendum)
Date: 06/24/2016               Patient Name:  Sherri Mcmillan MRN: [id number]  DOB: 08-Aug-1932 Age / Sex: 83 y.o., female   PCP: Holley Raring, MD         Medical Service: Internal Medicine Teaching Service         Attending Physician: Dr. Lucious Groves, DO    First Contact: Dr. Gay Filler Pager: [phone]  Second Contact: Dr. Benjamine Mola  Pager: [phone]       After Hours (After 5p/  First Contact Pager: [phone]  weekends / holidays): Second Contact Pager: [phone]   Chief Complaint: falls, slurred speech, and coffee ground emesis   History of Present Illness: Sherri Mcmillan is a 83 y.o. female with a PMH of hypertension, hyperlipidemia, diastolic dysfunction, venous insufficiency, depression/anxiety, migraine headaches and GERD and Barrett's esophogus 2/2 hiatal hernia (s/p nissen fundoplication). From 3/21-3/24 she felt nausea and had 3 episodes of black watery emesis. After this she began feeling unsteady on her feet and lightheaded when she stands from seated. She has had two falls this week which she relates to being clumsy. On Sunday she has resumed eating and had no further vomiting. Her daughter called her phone on Sunday and noticed that her speech sounded drawn out and slurred. She denies facial droop or one sided weakness.   She has a history of Barrett's Esophagus. EGD 10/14 revealed reflux related stricture s/p dilation, errosive esophagitis, hiatal hernia. She has been taking nexium but has difficulty affording this and does not take it consistently.   Meds:  Current Meds  Medication Sig  . acetaminophen (TYLENOL) 500 MG tablet Take 1,000-1,500 mg by mouth every 6 (six) hours as needed for mild pain.   Marland Kitchen albuterol (PROVENTIL HFA;VENTOLIN HFA) 108 (90 Base) MCG/ACT inhaler Inhale 2 puffs into the lungs every 4 (four) hours as needed for wheezing or shortness of breath.  Marland Kitchen amLODipine (NORVASC) 10 MG tablet TAKE 1 TABLET (10 MG TOTAL) BY MOUTH DAILY WITH BREAKFAST.  .  carboxymethylcellulose (REFRESH TEARS) 0.5 % SOLN Place 1 drop into both eyes 3 (three) times daily as needed (for eye irritation).  . Cetirizine HCl 10 MG CAPS Take one po daily prn drainage.  . citalopram (CELEXA) 20 MG tablet TAKE 1 TABLET (20 MG TOTAL) BY MOUTH DAILY.  . diphenhydrAMINE (ALLERGY RELIEF) 25 mg capsule Take 25-50 mg by mouth every 6 (six) hours as needed for itching or sleep (for itching/scheduled at bedtime).   Marland Kitchen docusate sodium (COLACE) 100 MG capsule Take 100 mg by mouth 2 (two) times daily as needed for mild constipation.  Marland Kitchen LORazepam (ATIVAN) 0.5 MG tablet Take 1 tablet (0.5 mg total) by mouth at bedtime. You may take 1 additional pill for severe anxiety during the day. (Patient taking differently: Take 0.5 mg by mouth 2 (two) times daily as needed for anxiety. )  . Melatonin 1 MG TABS TAKE 2 TABLETS BY MOUTH AT BEDTIME.  Marland Kitchen omeprazole (PRILOSEC) 20 MG capsule TAKE 1 CAPSULE BY MOUTH 2 TIMES DAILY BEFORE A MEAL.  Marland Kitchen propranolol (INDERAL) 20 MG tablet TAKE 1 TABLET BY MOUTH TWICE A DAY     Allergies: Allergies as of 06/23/2016 - Review Complete 06/23/2016  Allergen Reaction Noted  . Aspirin Rash 07/06/2006   Past Medical History:  Diagnosis Date  . Anemia 12/14/2012  . Aortic stenosis    Dx ECHO 2008 , mild and aymptomatic , needs ECHO  q 3-5 yrs  . Barrett's esophagus    Demonstrated on EGD 12/2010. EGD 09/17/2012 shows inflamed GE junctional mucosa without metaplasia, dysplasia, or malignancy.  . Bronchitis    hx of  . Cataract   . Chronic anxiety    Admission to [facility] (90s or early 2000)  for 6 weeks after mother died. Complicated again by death of her sister 2010. 2012 developed hallucinations and I refused to refill controlled meds unless she see psych which she is not agreable to  . Chronic insomnia   . Chronic pain    DJD knees, back, migranes, LLE varicose veins, and LLE neuropathy.   . Depression   . DVT (deep venous thrombosis) ([place])   . Elevated  cholesterol 10/11   LDL 155. Her 10 year risk (decreasing her age to 89 as the calculator won't go to age 77) is 21% ish. If consider her non smoker (which I wouldn't even though she says 1 pak lasts 2 weeks) risk is 12% ish. So goal for LDL is 130 or 100.  Marland Kitchen Gastroparesis    Demonstrated on GES 12/2010 by Dr Karmen Stabs  . GERD (gastroesophageal reflux disease)   . H/O hiatal hernia   . HTN (hypertension)    Controlled with 2 drug therapy  . Left leg DVT ([place]) 09/28/2012  . Migraine    sometimes   Family History:  Family History  Problem Relation Age of Onset  . Heart disease Mother   . Hypertension Mother   . Heart attack Mother   . Hypertension Father   . Heart attack Father   . Diabetes Brother   . Stroke Neg Hx   . Cancer Neg Hx   . Colon cancer Neg Hx   . Anesthesia problems Neg Hx   . Hypotension Neg Hx   . Malignant hyperthermia Neg Hx   . Pseudochol deficiency Neg Hx    Social History:  Social History   Social History  . Marital status: Single    Spouse name: N/A  . Number of children: N/A  . Years of education: N/A   Occupational History  . Not on file.   Social History Main Topics  . Smoking status: Former Smoker    Years: 50.00    Types: Cigarettes    Quit date: 06/01/2010  . Smokeless tobacco: Never Used  . Alcohol use No     Comment: quit 24 years ago  . Drug use: No  . Sexual activity: Not on file   Other Topics Concern  . 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   Physical Exam: Blood pressure 160/75, pulse 68, temperature 98.1 F (36.7 C), resp. rate 17, last  menstrual period 04/26/1950, SpO2 100 %. Physical Exam  Constitutional: She is oriented to person, place, and time. She appears well-developed and well-nourished. No distress.  She has a hoarse voice   HENT:  Head: Normocephalic and atraumatic.  Eyes: Conjunctivae are normal. No scleral icterus.  Left eye- post operative changes, pupil is oval  shaped and non reactive  Right eye- pupil round and reactive   Cardiovascular: Normal rate, regular rhythm and intact distal pulses.   3/6 diastolic murmur heard loudest over RUS border  Pulmonary/Chest: Effort normal. No respiratory distress. She has no wheezes. She has no rales.  Abdominal: Soft. She exhibits no distension. There is tenderness.  Tenderness to palpation over b/l lower quadrants   Neurological: She is alert and oriented to person, place, and time. No cranial nerve deficit.  Normal finger to nose  Strength equal and intact over b/l upper and lower extremities  Skin: Skin is warm and dry. She is not diaphoretic.  Psychiatric:  Inappropriate laughing    LABS  Na 138, K 3.9, CO2 25, BUN 14, Crt 0.8, GFR >60, Glucose 88,  Alk phos 80, albumin 4.2, AST 20, ALT 12 WBC 4.4, hgb 8.5 (b/l 9-10), MCV 77, RDW 16, plt 181 ESR 38 INR 1.1 Trop <0.03 FOBT neg  Urinalysis neg glucose, leukocytes, nitrites, or protein  EKG: sinus rhythm, normal axis, T wave inversion in V1 unchanged from prior   Chest xray Personal review of the chest xray reveals no acute cardiopulmonary disease   CT head w/o contrast Personal review of the image reveals chronic atrophy and  small vessel ischemia. No acute infarction or hemorrhage.   CT abdomen Personal review of the image reveals colonic stool, no free air.   Knee xray Personal review of the image reveals osteoarthritis   Assessment & Plan by Problem: Active Problems:   Ataxia  83 y.o. female with a PMH of hypertension, hyperlipidemia, diastolic dysfunction, venous insufficiency, depression/anxiety,  migraine headaches and GERD and Barrett's esophogus 2/2 hiatal hernia (s/p nissen fundoplication). Presented with concerns of coffee ground emesis, difficulty walking. She was given 1000 ml NS bolus and admitted for stroke workup.  Slurred speech  Four days of persistent slurred speech in this patient with a history of hypertension and hyperlipidemia is concerning for stroke.  She has no appreciable deficits on initial neuro exam and CT head has ruled out hemorrhagic stroke.  - Follow up MRI  -PT/ OT evaluation and treatment -permissive htn  -admit to tele   Ataxia  Sensation of unsteadiness and lightheadedness could be related to symptomatic anemia  Or stroke.  - follow up orthostatic vitals keeping in mind she is s/p 1 L bolus   Upper GI bleed  Iron deficiency anemia  Hx of barrett's esophagus on omeprazole 20 mg daily but has difficulty with access. She had 4 episodes of dark watery emesis last week. Prior EGD was in 2015- showed no evidence of ulcers. Hgb on admission is low, 8.5 and it is possible that her unsteadiness may be related to symptomatic anemia. Negative trop and unchanged EKG are reassuring that she has not suffered cardiac injury.  - ordered protonix 40 mg BID  -phenergan 12.5 mg q6h PRN  -follow up morning CBC   Elevated ESR  Hx of migraine headaches which initially responded well to propranolol but then returned with a component of tenderness to palpation over the the left temporal artery and muscle spasm over the left SCM. At last office visit she had just finished a course of prednisone and her presentation did not seem consistent with GCA so ESR and CRP. In the ED today ESR was elevated at 38. She did not mention the headaches or any vision changes during our interview. -continue to monitor for headaches     Constipation  - Senokot qHS  Hypertension  BP elevated 160/75 on admission. Home meds amlodipine 10 mg daily, and propranolol 20 mg BID for migraine  prophylaxis. Holding home medications for not to allow for permissive htn.   Anxiety/depression  -continue home medication celexa   Aortic atherosclerosis  Found incidentally on abdominal xray.   F NS 100 ml/hr  N NPO  DVT Ppx SCDs Code Status FULL   Dispo: Admit patient to Observation with expected length of stay less than 2 midnights.  Signed: Ledell Noss, MD 06/24/2016, 2:32 AM  Pager: [phone]

## 2016-06-24 NOTE — Progress Notes (Signed)
Subjective: Currently, the patient is feeling mildly improved. Still complains of generalized weakness and diffuse pain which she describes as "pins and needles". No further episodes of emesis. Still unsteady on her feet, but improved.  Objective: Vital signs in last 24 hours: Vitals:   06/24/16 0522 06/24/16 0546 06/24/16 0800 06/24/16 0948  BP: 135/78 132/83 (!) 138/56 127/68  Pulse: 67 71 68 66  Resp: 13 20 16 20   Temp:  98.4 F (36.9 C) 97.9 F (36.6 C) 98.3 F (36.8 C)  TempSrc:  Oral Oral Oral  SpO2: 100% 100% 97% 100%  Weight:  126 lb 3.2 oz (57.2 kg)    Height:  5' (1.524 m)     Physical Exam: Physical Exam  Constitutional: She is oriented to person, place, and time. She appears well-developed. She is cooperative. No distress.  Anxious and tearful during interview.  HENT:  Voice is hoarse, but at baseline  Cardiovascular: Normal rate, regular rhythm, normal heart sounds and normal pulses.  Exam reveals no gallop.   No murmur heard. Pulmonary/Chest: Effort normal and breath sounds normal. No respiratory distress. Breasts are symmetrical.  Abdominal: Soft. Bowel sounds are normal. There is no tenderness.  Musculoskeletal: She exhibits no edema.  Neurological: She is alert and oriented to person, place, and time. No sensory deficit.  Sensation grossly intact, but c/o intermittent paraesthesias diffusely.   Labs: CBC:  Recent Labs Lab 06/23/16 1949 06/24/16 1031  WBC 4.4 3.8*  HGB 8.5* 8.9*  HCT 28.4* 29.1*  MCV 77.4* 77.4*  PLT 181 159   Metabolic Panel:  Recent Labs Lab 06/23/16 1949 06/23/16 2334 06/24/16 0753 06/24/16 1031  NA 138  --  139  --   K 3.9  --  3.8  --   CL 105  --  105  --   CO2 25  --  24  --   GLUCOSE 88  --  95  --   BUN 14  --  11  --   CREATININE 0.80  --  0.81  --   CALCIUM 9.5  --  9.4  --   ALT 12*  --  11*  --   ALKPHOS 80  --  72  --   BILITOT 0.4  --  0.4  --   PROT 7.3  --  6.7  --   ALBUMIN 4.2  --  3.9  --     LABPROT  --  14.5 14.2 14.1  INR  --  1.12 1.10 1.09   Cardiac Labs:  Recent Labs Lab 06/23/16 2334  TROPONINI <0.03   BG:  Recent Labs Lab 06/24/16 0946  GLUCAP 85     Medications: Infusions: . sodium chloride     Scheduled Medications: . citalopram  20 mg Oral Daily  . pantoprazole  40 mg Oral BID  . senna-docusate  1 tablet Oral QHS  . sodium chloride flush  3 mL Intravenous Q12H   PRN Medications: acetaminophen **OR** acetaminophen, promethazine  Assessment/Plan: Pt is a 83 y.o. yo female with a PMHx of hypertension, hyperlipidemia, diastolic dysfunction, venous insufficiency, depression/anxiety, migraine headaches and GERD and Barrett's esophogus 2/2 hiatal hernia (s/p nissen fundoplication) who was admitted on 06/23/2016 with symptoms of frequent falls, generalized weakness, emesis, and ?slurred speech.  Slurred speech / Ataxia: Four days of persistent slurred speech in this patient with a history of hypertension and hyperlipidemia is concerning for stroke.  She has no appreciable deficits on initial neuro exam and CT head has  ruled out hemorrhagic stroke. Slurred speech note significant on exam today, may also be related to elevated anxiety. Sensation of unsteadiness and lightheadedness most consistent with dehydration 2/2 GI illness w/ emesis and poor PO intake. MRI negative for acute stroke. -PT evaluation - give additional 1L NS bolus  ?Upper GI bleed  Iron deficiency anemia  Hx of barrett's esophagus on omeprazole 20 mg daily but has difficulty with access. She had 4 episodes of dark watery emesis last week. Prior EGD was in 2015- showed no evidence of ulcers. Hgb on admission is low, 8.5 and it is possible that her unsteadiness may be related to symptomatic anemia. Negative trop and unchanged EKG are reassuring that she has not suffered cardiac injury. Feel that viral GI illness w/ emesis that started clear and then progressed may have exacerbated underlying  esophagitis. In combination with non-compliance with PPI therapy this may explain her current symptoms. No signs of active GI bleed. Would prefer outpt w/u if symptoms do not resolve with restarting PPI therapy. - protonix 40 mg BID - start iron supplementation  Constipation  - Senokot qHS   Hypertension  BP elevated 160/75 on admission. Home meds amlodipine 10 mg daily, and propranolol 20 mg BID for migraine prophylaxis. - restart home medications  Anxiety/depression Pt is tearful on exam and appears very anxious. She has a long h/o anxiety w/ several recent social stressors. She endorses feelings of depression today and reports feeling very low recently, feeling like she might be better off dead. She denies any SI and plans of self-harm. - Increase celexa to 40mg  daily - consider adding BusPar for anxiolytic effects at future visit  Length of Stay: 0 day(s) Dispo: Anticipated discharge today if tolerating PO and able to ambulate. Should have close outpt f/u.  Carolynn Comment, MD Pager: [phone] (7AM-5PM) 06/24/2016, 12:16 PM

## 2016-06-24 NOTE — ED Notes (Signed)
Pt ambulated from room D34 to bathroom rr8 and back to room.

## 2016-06-24 NOTE — H&P (Signed)
Date: 06/24/2016               Patient Name:  Sherri Mcmillan MRN: [id number]  DOB: 09-24-1932 Age / Sex: 83 y.o., female   PCP: Sherri Raring, MD         Medical Service: Internal Medicine Teaching Service         Attending Physician: Dr. Lucious Groves, DO    First Contact: Dr. Gay Filler Pager: [phone]  Second Contact: Dr. Benjamine Mola  Pager: [phone]       After Hours (After 5p/  First Contact Pager: [phone]  weekends / holidays): Second Contact Pager: [phone]   Chief Complaint: falls, slurred speech, and coffee ground emesis   History of Present Illness: [name] is a 83 y.o. female with a PMH of hypertension, hyperlipidemia, diastolic dysfunction, venous insufficiency, depression/anxiety, migraine headaches and GERD and Barrett's esophogus 2/2 hiatal hernia (s/p nissen fundoplication). From 3/21-3/24 she felt nausea and had 3 episodes of black watery emesis. After this she began feeling unsteady on her feet and lightheaded when she stands from seated. She has had two falls this week which she relates to being clumsy. On Sunday she has resumed eating and had no further vomiting. Her daughter called her phone on Sunday and noticed that her speech sounded drawn out and slurred. She denies facial droop or one sided weakness.   She has a history of Barrett's Esophagus. EGD 10/14 revealed reflux related stricture s/p dilation, errosive esophagitis, hiatal hernia. She has been taking nexium but has difficulty affording this and does not take it consistently.   Meds:  Current Meds  Medication Sig  . acetaminophen (TYLENOL) 500 MG tablet Take 1,000-1,500 mg by mouth every 6 (six) hours as needed for mild pain.   Marland Kitchen albuterol (PROVENTIL HFA;VENTOLIN HFA) 108 (90 Base) MCG/ACT inhaler Inhale 2 puffs into the lungs every 4 (four) hours as needed for wheezing or shortness of breath.  Marland Kitchen amLODipine (NORVASC) 10 MG tablet TAKE 1 TABLET (10 MG TOTAL) BY MOUTH DAILY WITH BREAKFAST.  .  carboxymethylcellulose (REFRESH TEARS) 0.5 % SOLN Place 1 drop into both eyes 3 (three) times daily as needed (for eye irritation).  . Cetirizine HCl 10 MG CAPS Take one po daily prn drainage.  . citalopram (CELEXA) 20 MG tablet TAKE 1 TABLET (20 MG TOTAL) BY MOUTH DAILY.  . diphenhydrAMINE (ALLERGY RELIEF) 25 mg capsule Take 25-50 mg by mouth every 6 (six) hours as needed for itching or sleep (for itching/scheduled at bedtime).   Marland Kitchen docusate sodium (COLACE) 100 MG capsule Take 100 mg by mouth 2 (two) times daily as needed for mild constipation.  Marland Kitchen LORazepam (ATIVAN) 0.5 MG tablet Take 1 tablet (0.5 mg total) by mouth at bedtime. You may take 1 additional pill for severe anxiety during the day. (Patient taking differently: Take 0.5 mg by mouth 2 (two) times daily as needed for anxiety. )  . Melatonin 1 MG TABS TAKE 2 TABLETS BY MOUTH AT BEDTIME.  Marland Kitchen omeprazole (PRILOSEC) 20 MG capsule TAKE 1 CAPSULE BY MOUTH 2 TIMES DAILY BEFORE A MEAL.  Marland Kitchen propranolol (INDERAL) 20 MG tablet TAKE 1 TABLET BY MOUTH TWICE A DAY     Allergies: Allergies as of 06/23/2016 - Review Complete 06/23/2016  Allergen Reaction Noted  . Aspirin Rash 07/06/2006   Past Medical History:  Diagnosis Date  . Anemia 12/14/2012  . Aortic stenosis    Dx ECHO 2008 , mild and aymptomatic , needs ECHO  q 3-5 yrs  . Barrett's esophagus    Demonstrated on EGD 12/2010. EGD 09/17/2012 shows inflamed GE junctional mucosa without metaplasia, dysplasia, or malignancy.  . Bronchitis    hx of  . Cataract   . Chronic anxiety    Admission to [facility] (90s or early 2000)  for 6 weeks after mother died. Complicated again by death of her sister 2010. 2012 developed hallucinations and I refused to refill controlled meds unless she see psych which she is not agreable to  . Chronic insomnia   . Chronic pain    DJD knees, back, migranes, LLE varicose veins, and LLE neuropathy.   . Depression   . DVT (deep venous thrombosis) ([place])   . Elevated  cholesterol 10/11   LDL 155. Her 10 year risk (decreasing her age to 16 as the calculator won't go to age 84) is 21% ish. If consider her non smoker (which I wouldn't even though she says 1 pak lasts 2 weeks) risk is 12% ish. So goal for LDL is 130 or 100.  Marland Kitchen Gastroparesis    Demonstrated on GES 12/2010 by Dr Karmen Stabs  . GERD (gastroesophageal reflux disease)   . H/O hiatal hernia   . HTN (hypertension)    Controlled with 2 drug therapy  . Left leg DVT ([place]) 09/28/2012  . Migraine    sometimes   Family History:  Family History  Problem Relation Age of Onset  . Heart disease Mother   . Hypertension Mother   . Heart attack Mother   . Hypertension Father   . Heart attack Father   . Diabetes Brother   . Stroke Neg Hx   . Cancer Neg Hx   . Colon cancer Neg Hx   . Anesthesia problems Neg Hx   . Hypotension Neg Hx   . Malignant hyperthermia Neg Hx   . Pseudochol deficiency Neg Hx    Social History:  Social History   Social History  . Marital status: Single    Spouse name: N/A  . Number of children: N/A  . Years of education: N/A   Occupational History  . Not on file.   Social History Main Topics  . Smoking status: Former Smoker    Years: 50.00    Types: Cigarettes    Quit date: 06/01/2010  . Smokeless tobacco: Never Used  . Alcohol use No     Comment: quit 24 years ago  . Drug use: No  . Sexual activity: Not on file   Other Topics Concern  . 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   Physical Exam: Blood pressure 160/75, pulse 68, temperature 98.1 F (36.7 C), resp. rate 17, last  menstrual period 04/26/1950, SpO2 100 %. Physical Exam  Constitutional: She is oriented to person, place, and time. She appears well-developed and well-nourished. No distress.  She has a hoarse voice   HENT:  Head: Normocephalic and atraumatic.  Eyes: Conjunctivae are normal. No scleral icterus.  Left eye- post operative changes, pupil is oval  shaped and non reactive  Right eye- pupil round and reactive   Cardiovascular: Normal rate, regular rhythm and intact distal pulses.   3/6 diastolic murmur heard loudest over RUS border  Pulmonary/Chest: Effort normal. No respiratory distress. She has no wheezes. She has no rales.  Abdominal: Soft. She exhibits no distension. There is tenderness.  Tenderness to palpation over b/l lower quadrants   Neurological: She is alert and oriented to person, place, and time. No cranial nerve deficit.  Normal finger to nose  Strength equal and intact over b/l upper and lower extremities  Skin: Skin is warm and dry. She is not diaphoretic.  Psychiatric:  Inappropriate laughing    LABS  Na 138, K 3.9, CO2 25, BUN 14, Crt 0.8, GFR >60, Glucose 88,  Alk phos 80, albumin 4.2, AST 20, ALT 12 WBC 4.4, hgb 8.5 (b/l 9-10), MCV 77, RDW 16, plt 181 ESR 38 INR 1.1 Trop <0.03 FOBT neg  Urinalysis neg glucose, leukocytes, nitrites, or protein  EKG: sinus rhythm, normal axis, T wave inversion in V1 unchanged from prior   Chest xray Personal review of the chest xray reveals no acute cardiopulmonary disease   CT head w/o contrast Personal review of the image reveals chronic atrophy and  small vessel ischemia. No acute infarction or hemorrhage.   CT abdomen Personal review of the image reveals colonic stool, no free air.   Knee xray Personal review of the image reveals osteoarthritis   Assessment & Plan by Problem: Active Problems:   Ataxia  83 y.o. female with a PMH of hypertension, hyperlipidemia, diastolic dysfunction, venous insufficiency, depression/anxiety,  migraine headaches and GERD and Barrett's esophogus 2/2 hiatal hernia (s/p nissen fundoplication). Presented with concerns of coffee ground emesis, difficulty walking. She was given 1000 ml NS bolus and admitted for stroke workup.  Slurred speech  Four days of persistent slurred speech in this patient with a history of hypertension and hyperlipidemia is concerning for stroke.  She has no appreciable deficits on initial neuro exam and CT head has ruled out hemorrhagic stroke.  - Follow up MRI  -PT/ OT evaluation and treatment -permissive htn  -admit to tele   Ataxia  Sensation of unsteadiness and lightheadedness could be related to symptomatic anemia  Or stroke.  - follow up orthostatic vitals keeping in mind she is s/p 1 L bolus   Upper GI bleed  Iron deficiency anemia  Hx of barrett's esophagus on omeprazole 20 mg daily but has difficulty with access. She had 4 episodes of dark watery emesis last week. Prior EGD was in 2015- showed no evidence of ulcers. Hgb on admission is low, 8.5 and it is possible that her unsteadiness may be related to symptomatic anemia. Negative trop and unchanged EKG are reassuring that she has not suffered cardiac injury.  - ordered protonix 40 mg BID  -phenergan 12.5 mg q6h PRN  -follow up morning CBC   Elevated ESR  Hx of migraine headaches which initially responded well to propranolol but then returned with a component of tenderness to palpation over the the left temporal artery and muscle spasm over the left SCM. At last office visit she had just finished a course of prednisone and her presentation did not seem consistent with GCA so ESR and CRP. In the ED today ESR was elevated at 38. She did not mention the headaches or any vision changes during our interview. -continue to monitor for headaches     Constipation  - Senokot qHS  Hypertension  BP elevated 160/75 on admission. Home meds amlodipine 10 mg daily, and propranolol 20 mg BID for migraine  prophylaxis. Holding home medications for not to allow for permissive htn.   Anxiety/depression  -continue home medication celexa   Aortic atherosclerosis  Found incidentally on abdominal xray.   F NS 100 ml/hr  N NPO  DVT Ppx SCDs Code Status FULL   Dispo: Admit patient to Observation with expected length of stay less than 2 midnights.  Signed: Ledell Noss, MD 06/24/2016, 2:32 AM  Pager: [phone]

## 2016-06-24 NOTE — Progress Notes (Signed)
Patient placed on cardiac monitor 5C08.

## 2016-06-24 NOTE — ED Notes (Signed)
ED Provider at bedside. 

## 2016-06-24 NOTE — ED Notes (Signed)
Pt ambulated to the bathroom; RN observed pt ambulating to room

## 2016-06-24 NOTE — ED Notes (Signed)
RN attempted to call report with no answer; RN to call back

## 2016-06-24 NOTE — Evaluation (Signed)
Physical Therapy Evaluation Patient Details Name: Sherri Mcmillan MRN: [id number] DOB: 10-Jul-1932 Today's Date: 06/24/2016   History of Present Illness  83 y.o. female with a PMH of hypertension, hyperlipidemia, diastolic dysfunction, venous insufficiency, depression/anxiety, migraine headaches and GERD and Barrett's esophogus 2/2 hiatal hernia (s/p nissen fundoplication). From 3/21-3/24 she felt nausea and had 3 episodes of black watery emesis. After this she began feeling unsteady on her feet and lightheaded when she stands from seated. She has had two falls this week which she relates to being clumsy.  Clinical Impression  Patient presents with decreased mobility due to decreased sensation, poor balance, poor activity tolerance and recent multiple falls at home.  Patient previously at independent level, but now needs at least min A for mobility.  Feel she will need skilled PT in the acute setting and follow up STSNF rehab prior to d/c home as she has only intermittent support at home.      Follow Up Recommendations SNF    Equipment Recommendations  None recommended by PT    Recommendations for Other Services       Precautions / Restrictions Precautions Precautions: Fall      Mobility  Bed Mobility Overal bed mobility: Needs Assistance       Supine to sit: HOB elevated;Min assist Sit to supine: Min guard   General bed mobility comments: assist for lifting trunk, pt moaning with pain during activity, then assisted legs into bed, sat up with railing and HOB elevated  Transfers Overall transfer level: Needs assistance Equipment used: Rolling walker (2 wheeled) Transfers: Sit to/from Stand Sit to Stand: Min assist         General transfer comment: assist for balance with RW, pt c/o UE/LE pain   Ambulation/Gait Ambulation/Gait assistance: Min guard Ambulation Distance (Feet): 120 Feet Assistive device: Rolling walker (2 wheeled) Gait Pattern/deviations: Step-through  pattern;Decreased stride length;Trunk flexed;Shuffle     General Gait Details: minguard for balance/safety  Stairs Stairs: Yes Stairs assistance: Mod assist;Min assist Stair Management: Two rails;Step to pattern;Forwards Number of Stairs: 3 General stair comments: initially min A for safety, then pt with posterior LOB coming down stairs mod A for recovery  Wheelchair Mobility    Modified Rankin (Stroke Patients Only)       Balance Overall balance assessment: History of Falls;Needs assistance Sitting-balance support: No upper extremity supported;Feet supported Sitting balance-Leahy Scale: Fair     Standing balance support: Bilateral upper extremity supported Standing balance-Leahy Scale: Poor Standing balance comment: min A for balance with RW                             Pertinent Vitals/Pain Faces Pain Scale: Hurts whole lot Pain Location: intermittent "all over" "like pin needles." First observed during session upon return to sitting position EOB  Pain Descriptors / Indicators: Pins and needles;Grimacing Pain Intervention(s): Repositioned;Limited activity within patient's tolerance;Monitored during session    Home Living Family/patient expects to be discharged to:: Private residence Living Arrangements: Other relatives (brother) Available Help at Discharge: Friend(s);Available PRN/intermittently Type of Home: House Home Access: Stairs to enter Entrance Stairs-Rails: Doctor, general practiceight;Left Entrance Stairs-Number of Steps: 3 Home Layout: One level Home Equipment: Walker - 2 wheels      Prior Function Level of Independence: Independent               Hand Dominance   Dominant Hand: Right    Extremity/Trunk Assessment   Upper Extremity Assessment Upper Extremity Assessment:  Generalized weakness    Lower Extremity Assessment Lower Extremity Assessment: Generalized weakness       Communication   Communication: Expressive difficulties (slurred speech)   Cognition Arousal/Alertness: Awake/alert Behavior During Therapy: Anxious Overall Cognitive Status: Within Functional Limits for tasks assessed                      General Comments      Exercises     Assessment/Plan    PT Assessment Patient needs continued PT services  PT Problem List Decreased strength;Decreased activity tolerance;Decreased balance;Decreased knowledge of use of DME;Decreased mobility;Decreased safety awareness;Impaired sensation       PT Treatment Interventions Gait training;DME instruction;Stair training;Balance training;Functional mobility training;Neuromuscular re-education;Therapeutic exercise;Patient/family education;Therapeutic activities    PT Goals (Current goals can be found in the Care Plan section)  Acute Rehab PT Goals Patient Stated Goal: regain independence. feel better.  PT Goal Formulation: With patient Time For Goal Achievement: 07/01/16 Potential to Achieve Goals: Fair    Frequency Min 4X/week   Barriers to discharge        Co-evaluation               End of Session Equipment Utilized During Treatment: Gait belt Activity Tolerance: Patient limited by pain Patient left: with call bell/phone within reach;in bed;with bed alarm set Nurse Communication: Patient requests pain meds (due to epigastric pain from reflux) PT Visit Diagnosis: Other abnormalities of gait and mobility (R26.89);Repeated falls (R29.6)    Functional Assessment Tool Used: AM-PAC 6 Clicks Basic Mobility Functional Limitation: Mobility: Walking and moving around Mobility: Walking and Moving Around Current Status [phone]): At least 40 percent but less than 60 percent impaired, limited or restricted Mobility: Walking and Moving Around Goal Status [phone]): At least 20 percent but less than 40 percent impaired, limited or restricted    Time: 1422-1451 PT Time Calculation (min) (ACUTE ONLY): 29 min   Charges:   PT Evaluation $PT Eval Moderate  Complexity: 1 Procedure PT Treatments $Gait Training: 8-22 mins   PT G Codes:   PT G-Codes **NOT FOR INPATIENT CLASS** Functional Assessment Tool Used: AM-PAC 6 Clicks Basic Mobility Functional Limitation: Mobility: Walking and moving around Mobility: Walking and Moving Around Current Status (U9811): At least 40 percent but less than 60 percent impaired, limited or restricted Mobility: Walking and Moving Around Goal Status [phone]): At least 20 percent but less than 40 percent impaired, limited or restricted     Elray Mcgregor 06/24/2016, 5:28 PM  Sheran Lawless, PT [phone] 06/24/2016

## 2016-06-24 NOTE — ED Notes (Signed)
Admitting Provider at bedside. 

## 2016-06-24 NOTE — Progress Notes (Signed)
Per MD patient NPO but may have sips with meds until further evaluation for GI consult. Per MD q 2 vitals and neuro checks for 12 hr then per unit routine. RN will give tylenol for patient back pain and continue to monitor.

## 2016-06-25 DIAGNOSIS — E785 Hyperlipidemia, unspecified: Secondary | ICD-10-CM | POA: Diagnosis not present

## 2016-06-25 DIAGNOSIS — R9089 Other abnormal findings on diagnostic imaging of central nervous system: Secondary | ICD-10-CM | POA: Diagnosis not present

## 2016-06-25 DIAGNOSIS — A084 Viral intestinal infection, unspecified: Secondary | ICD-10-CM | POA: Diagnosis not present

## 2016-06-25 DIAGNOSIS — I10 Essential (primary) hypertension: Secondary | ICD-10-CM | POA: Diagnosis not present

## 2016-06-25 DIAGNOSIS — E861 Hypovolemia: Secondary | ICD-10-CM | POA: Diagnosis not present

## 2016-06-25 DIAGNOSIS — I700 Atherosclerosis of aorta: Secondary | ICD-10-CM | POA: Diagnosis not present

## 2016-06-25 DIAGNOSIS — R270 Ataxia, unspecified: Secondary | ICD-10-CM | POA: Diagnosis not present

## 2016-06-25 DIAGNOSIS — K210 Gastro-esophageal reflux disease with esophagitis: Secondary | ICD-10-CM | POA: Diagnosis not present

## 2016-06-25 DIAGNOSIS — D500 Iron deficiency anemia secondary to blood loss (chronic): Secondary | ICD-10-CM | POA: Diagnosis not present

## 2016-06-25 LAB — BASIC METABOLIC PANEL
Anion gap: 9 (ref 5–15)
BUN: 13 mg/dL (ref 6–20)
CHLORIDE: 104 mmol/L (ref 101–111)
CO2: 25 mmol/L (ref 22–32)
Calcium: 9.3 mg/dL (ref 8.9–10.3)
Creatinine, Ser: 0.89 mg/dL (ref 0.44–1.00)
GFR calc Af Amer: >60 mL/min (ref >60)
GFR calc non Af Amer: 59 mL/min — ABNORMAL LOW (ref >60)
GLUCOSE: 102 mg/dL — AB (ref 65–99)
Potassium: 3.7 mmol/L (ref 3.5–5.1)
Sodium: 138 mmol/L (ref 135–145)

## 2016-06-25 LAB — LIPID PANEL
CHOL/HDL RATIO: 2.4 ratio
Cholesterol: 220 mg/dL — ABNORMAL HIGH (ref 0–200)
HDL: 93 mg/dL (ref >40)
LDL Cholesterol: 118 mg/dL — ABNORMAL HIGH (ref 0–99)
Triglycerides: 44 mg/dL (ref <150)
VLDL: 9 mg/dL (ref 0–40)

## 2016-06-25 LAB — GLUCOSE, CAPILLARY: GLUCOSE-CAPILLARY: 94 mg/dL (ref 65–99)

## 2016-06-25 LAB — PHOSPHORUS: Phosphorus: 3.6 mg/dL (ref 2.5–4.6)

## 2016-06-25 MED ORDER — FERROUS SULFATE 325 (65 FE) MG PO TABS: 325.0000 mg | ORAL_TABLET | Freq: Every day | ORAL | 3 refills | Status: DC

## 2016-06-25 MED ORDER — GI COCKTAIL ~~LOC~~
30.0000 mL | Freq: Three times a day (TID) | ORAL | Status: DC | PRN
  Administered 2016-06-25: 30 mL via ORAL
  Filled 2016-06-25 (×2): qty 30

## 2016-06-25 MED ORDER — ESCITALOPRAM OXALATE 20 MG PO TABS: 20.0000 mg | ORAL_TABLET | Freq: Every day | ORAL | 0 refills | Status: DC

## 2016-06-25 MED ORDER — CITALOPRAM HYDROBROMIDE 40 MG PO TABS: 40.0000 mg | ORAL_TABLET | Freq: Every day | ORAL | 0 refills | Status: DC

## 2016-06-25 MED ORDER — GABAPENTIN 600 MG PO TABS
300.0000 mg | ORAL_TABLET | Freq: Three times a day (TID) | ORAL | Status: DC
  Administered 2016-06-25 (×2): 300 mg via ORAL
  Filled 2016-06-25 (×2): qty 1

## 2016-06-25 MED ORDER — HYDROMORPHONE HCL 1 MG/ML IJ SOLN
0.5000 mg | Freq: Once | INTRAMUSCULAR | Status: AC
  Administered 2016-06-25: 0.5 mg via INTRAVENOUS
  Filled 2016-06-25: qty 1

## 2016-06-25 MED ORDER — GABAPENTIN 600 MG PO TABS: 300.0000 mg | ORAL_TABLET | Freq: Three times a day (TID) | ORAL | 2 refills | Status: DC

## 2016-06-25 MED ORDER — PANTOPRAZOLE SODIUM 40 MG PO TBEC: 40.0000 mg | DELAYED_RELEASE_TABLET | Freq: Two times a day (BID) | ORAL | 2 refills | Status: DC

## 2016-06-25 MED ORDER — GUAIFENESIN 100 MG/5ML PO SOLN
5.0000 mL | Freq: Four times a day (QID) | ORAL | Status: DC
  Administered 2016-06-25 (×2): 100 mg via ORAL
  Filled 2016-06-25 (×2): qty 10

## 2016-06-25 NOTE — Progress Notes (Addendum)
Internal Medicine Attending:   I saw and examined the patient. I reviewed the resident's note and I agree with the resident's findings and plan as documented in the resident's note. Patient seen yesterday by PT who recommended SNF due to continued gait instability and weakness.  Patient had a friend who passed away at a SNF so she is wary of going to SNF but she will speak with family and make a decesion.  If she does not go to SNF she will need home health set up. Also appreciate Pharmacy note about celexa dose of 40mg  over FDA recommendation for her age. Lower dose or change medication would consider change to Lexapro 20mg  daily.

## 2016-06-25 NOTE — Progress Notes (Signed)
Subjective: Pt is still feeling poorly with complaints of intermittent paraesthesias and sharp pains in her lower extremities, as well as episodic sharp headaches with start in her neck and move up to the occiput. She does endorse some improvement after gabapentin last night.  She also continues to complain of fullness in her throat with occasional tightness that causes difficulty with speaking. No significant slurred speech outside of these episodes. This is not a new problem, but seems to be exacerbated recently with her increased anxiety.  Pt is hesitant to consider SNF which was recommended by PT for rehabilitation after frequent falls. She reportedly had a bad experience with rehab after her sister passed away in one such facility. She will continue to think about this as a safe disposition option.  Objective: Vital signs in last 24 hours: Vitals:   06/24/16 1824 06/24/16 2110 06/25/16 0203 06/25/16 0550  BP: (!) 148/69 (!) 160/70 (!) 151/84 (!) 128/58  Pulse: 70 63 67 (!) 59  Resp: 18 18 20 20   Temp: 98.8 F (37.1 C) 98.3 F (36.8 C) 98 F (36.7 C) 98.2 F (36.8 C)  TempSrc: Oral Oral Oral Oral  SpO2: 100% 100% 100% 97%  Weight:    131 lb 12.8 oz (59.8 kg)  Height:       Physical Exam: Physical Exam  Constitutional: She is oriented to person, place, and time. She appears well-developed. She is cooperative. No distress.  Anxious during interview, with frequent hand wringing.  HENT:  Voice is hoarse, but at baseline. Occasional stutter in speaking which is appears 2/2 to acute tightness of larynx and resolves with throat clearing.  Cardiovascular: Normal rate, regular rhythm, normal heart sounds and normal pulses.  Exam reveals no gallop.   No murmur heard. Pulmonary/Chest: Effort normal and breath sounds normal. No respiratory distress. Breasts are symmetrical.  No appreciable pulmonary congestion on auscultation. No appreciable upper airway sounds.  Abdominal: Soft. Bowel  sounds are normal. There is no tenderness.  Musculoskeletal: She exhibits no edema or tenderness.  Neurological: She is alert and oriented to person, place, and time. No sensory deficit.  Sensation grossly intact, but c/o intermittent paraesthesias diffusely, not present at the time of exam.   Medications: Scheduled Medications: . citalopram  40 mg Oral Daily  . ferrous sulfate  325 mg Oral Q breakfast  . gabapentin  300 mg Oral TID  . guaiFENesin  5 mL Oral Q6H  . pantoprazole  40 mg Oral BID  . senna-docusate  1 tablet Oral QHS   PRN Medications: acetaminophen **OR** acetaminophen, gi cocktail, promethazine  Assessment/Plan: Pt is a 83 y.o. yo female with a PMHx of hypertension, hyperlipidemia, diastolic dysfunction, venous insufficiency, depression/anxiety, migraine headaches and GERD and Barrett's esophogus 2/2 hiatal hernia (s/p nissen fundoplication) who was admitted on 06/23/2016 with symptoms of frequent falls, generalized weakness, emesis, and ?slurred speech.  Slurred speech / Ataxia suspected 2/2 hypovolemia from GI illness: MRI negative for acute stroke. Improving w/ IVF. Orthostatics normal, but only obtained after IV bolus. PT recommends rehabilitation w/ SNF -PT eval/treat -CSW for SNF placement  Iron deficiency anemia GERD w/ esophagitis Hx of barrett's esophagus on omeprazole 20 mg daily but recently non-compliant. She had 4 episodes emesis last week, began clear and then became dark. Prior EGD in 2015 showed no evidence of ulcers. Hgb on admission is low, 8.5 trended up to 8.9. Feel that viral GI illness w/ emesis that started clear and then progressed may have exacerbated underlying esophagitis.  In combination with non-compliance with PPI therapy this may explain her current symptoms. No signs of active GI bleed, suspect chronic blood loss given Fe deficiency. Would prefer outpt w/u w/ GI if symptoms do not resolve with restarting PPI therapy. - protonix 40 mg BID - GI  cocktail PRN - guaifenesin PRN for thick upper-airway secretions - iron supplementation  Constipation  - Senokot qHS   Hypertension HLD BP elevated 160/75 on admission, now controlled. Home meds restarted: amlodipine 10 mg daily, and propranolol 20 mg BID for migraine prophylaxis. Fasting lipid panel with hypercholesterolemia, combined w/ age, race, and HTN, make her at high risk for ASCVD (>27% 10-yr risk). - home meds - discuss starting statin, though would likely advise against for primary ppx  Anxiety/depression She has a long h/o anxiety w/ several recent social stressors. She endorses feelings of depression today and reports feeling very low recently, feeling like she might be better off dead. She denies any SI and plans of self-harm. - Continue celexa 40mg  daily - consider adding BusPar for anxiolytic effects in future - avoid benzos given h/o dependence and elderly age  Length of Stay: 0 day(s) Dispo: Anticipated discharge to SNF when bed available.  Carolynn Comment, MD Pager: [phone] (7AM-5PM) 06/25/2016, 7:04 AM

## 2016-06-25 NOTE — Clinical Social Work Note (Addendum)
CSW consulted for SNF placement. P/T and O/T recommending SNF for STR. CSW discussed with RNCM. Per RNCM, pt is refusing SNF and wants to go home. RNCM spoke with pt's friend-[name], with pt's permission, to confirm d/c plan. RNCM to follow for d/c planning. CSW signing off as no further needs identified.    McLeaCorlis Hoven, LCSWA, LCASA Clinical Social Work [phone]

## 2016-06-25 NOTE — Discharge Summary (Signed)
Name: Sherri Mcmillan MRN: [id number] DOB: 22-Nov-1932 83 y.o. PCP: Sherri Mcmillan , MD  Date of Admission: 06/23/2016 10:34 PM Date of Discharge: 06/25/2016 Attending Physician: Sherri RungErik C Hoffman, DO  Discharge Diagnosis: Principal Problem:   Ataxia Active Problems:   Hyperlipidemia   Anxiety and depression   HTN (hypertension)   GERD (gastroesophageal reflux disease)   Aortic atherosclerosis (HCC)   Viral gastroenteritis   Iron deficiency anemia due to chronic blood loss   Abnormal finding on MRI of brain   Discharge Medications: Allergies as of 06/25/2016      Reactions   Aspirin Rash      Medication List    STOP taking these medications   citalopram 20 MG tablet Commonly known as:  CELEXA   LORazepam 0.5 MG tablet Commonly known as:  ATIVAN   omeprazole 20 MG capsule Commonly known as:  PRILOSEC     TAKE these medications   acetaminophen 500 MG tablet Commonly known as:  TYLENOL Take 1,000-1,500 mg by mouth every 6 (six) hours as needed for mild pain.   albuterol 108 (90 Base) MCG/ACT inhaler Commonly known as:  PROVENTIL HFA;VENTOLIN HFA Inhale 2 puffs into the lungs every 4 (four) hours as needed for wheezing or shortness of breath.   ALLERGY RELIEF 25 mg capsule Generic drug:  diphenhydrAMINE Take 25-50 mg by mouth every 6 (six) hours as needed for itching or sleep (for itching/scheduled at bedtime).   amLODipine 10 MG tablet Commonly known as:  NORVASC TAKE 1 TABLET (10 MG TOTAL) BY MOUTH DAILY WITH BREAKFAST.   Cetirizine HCl 10 MG Caps Take one po daily prn drainage.   docusate sodium 100 MG capsule Commonly known as:  COLACE Take 100 mg by mouth 2 (two) times daily as needed for mild constipation.   escitalopram 20 MG tablet Commonly known as:  LEXAPRO Take 1 tablet (20 mg total) by mouth daily.   ferrous sulfate 325 (65 FE) MG tablet Take 1 tablet (325 mg total) by mouth daily with breakfast. Start taking on:  06/26/2016   gabapentin 600 MG  tablet Commonly known as:  NEURONTIN Take 0.5 tablets (300 mg total) by mouth 3 (three) times daily.   Melatonin 1 MG Tabs TAKE 2 TABLETS BY MOUTH AT BEDTIME.   pantoprazole 40 MG tablet Commonly known as:  PROTONIX Take 1 tablet (40 mg total) by mouth 2 (two) times daily.   propranolol 20 MG tablet Commonly known as:  INDERAL TAKE 1 TABLET BY MOUTH TWICE A DAY   REFRESH TEARS 0.5 % Soln Generic drug:  carboxymethylcellulose Place 1 drop into both eyes 3 (three) times daily as needed (for eye irritation).       Disposition and follow-up:   [name] St[facility].  At the hospital follow up visit please address:  1.  Frequent Falls / Ataxia: improved nutrition and PO intake? Deconditioning addressed by PT therapy? GERD: Compliance with PPI and improvement in symptoms? Consider outpt GI referral if symptoms are persistent. Would also consider ENT referral for concern of laryngeal pathology given hoarseness and complaints of laryngeal fullness, though this may be due to underlying reflux. Anxiety and Depression: symptoms improved on higher dose escitalopram? Titrate SSRI as appropriate, consider adjuvant therapy.  Vit D Deficiency: Vit D level found to be low at 5.6. Please initiate supplementation at 50,000 U weekly for 8 weeks followed by Vit D level.  Follow-up Appointments: Follow-up Information  [facility]. Go on 06/30/2016.   Why:  Appt at 10:45am Contact information: [address] [place] [address] [phone]          Hospital Course by problem list: Principal Problem:   Ataxia Active Problems:   Hyperlipidemia   Anxiety and depression   HTN (hypertension)   GERD (gastroesophageal reflux disease)   Aortic atherosclerosis (HCC)   Viral gastroenteritis   Iron deficiency anemia due to chronic blood loss   Abnormal finding on MRI of brain   1. Slurred  speech / Ataxia suspected 2/2 hypovolemia from GI illness: Pt presented for concerns of frequent falls, ataxia, and intermittent slurred speech. CT and MRI were negative for acute stroke. Her complaints are intermittent and appear to be improving w/ IVF. Orthostatics normal, but only obtained after IV bolus. We suspect that GI illness causing N/V reduced PO intake and resulted in dehydration. These symptoms likely exacerbated underlying poor nutrition in the setting as pt endorsed poor nutritional habits at home, citing her GERD symptoms. PT evaluated the pt and recommends rehabilitation w/ SNF. Pt was somewhat resistant to rehabilitation facility given a poor experience in one of her close friends. It was decided that she would have family and friends supervise her at home and Home Health PT would be assist in gait training and rehab at home.  Iron deficiency anemia GERD w/ esophagitis Pt presented with complaints of N/V. She has a hx of barrett's esophagus on omeprazole 20 mg daily but recently non-compliant. She had 4 episodes emesis last week, began clear and then became dark. Prior EGD in 2015 showed no evidence of ulcers. Hgb on admission is low, 8.5 trended up to 8.9. Feel that viral GI illness w/ emesis that started clear and then progressed may have exacerbated underlying esophagitis. In combination with non-compliance with PPI therapy this may explain her current symptoms. No signs of active GI bleed, suspect chronic blood loss given Fe deficiency. Would prefer outpt w/u w/ GI if symptoms do not resolve with restarting PPI therapy. We have restarted PPI therapy with protonix 40 mg BID. Prescribed GI cocktail PRN GERD symptoms and recommended guaifenesin PRN for thick upper-airway secretions and suspected laryngopharyngeal reflux. She was also started on oral iron supplementation.  Hypertension HLD BP elevated 160/75 on admission in the setting of non-compliance with home medications 2/2 N/V, now  controlled on home regimen. Home meds were restarted which include amlodipine 10 mg daily and and propranolol 20 mg BID which she is on for migraine prophylaxis. We ordered a fasting lipid panel which demonstrated hypercholesterolemia to better qualify her risk of ASCVD and future stroke. Her hyperlipidemia, combined w/ age, race, and HTN, make her at high risk for ASCVD (>27% 10-yr risk). However, we would likely advise against statin therapy for primary ppx in elderly lady given reduced benefit and risk of poor tolerance w/ GI side-effects and myalgias.  Anxiety/depression: She has a long h/o anxiety w/ several recent social stressors. She endorses feelings of depression which are worse recently than her baseline and reports feeling very low recently, feeling like she might be better off dead. She denies any SI and plans of self-harm. She also presented with acutely heightened sense of anxiety with constant hand wringing and worry over her health. We have increased her SSRI to 20mg  of escitalopram (from 20mg  of citalopram) daily and would consider further therapy with another agent such as BusPar for anxiolytic effects in future. We would avoid benzos  given h/o dependence and elderly age. Pt is also complaining of paraesthesias and sharp pain sensations which are intermittent. This may be somatization of psychiatric complaints, as metabolic w/u was been negative and seem to be significantly increased 2/2 acute anxiety. Pt has found some relief from gabapentin and could consider titration of this medication.  Discharge Vitals:   BP (!) 128/58 (BP Location: Right Arm)   Pulse (!) 59   Temp 98.7 F (37.1 C) (Oral)   Resp 20   Ht 5' (1.524 m)   Wt 131 lb 12.8 oz (59.8 kg)   LMP 04/26/1950   SpO2 97%   BMI 25.74 kg/m   Pertinent Labs, Studies, and Procedures: As below.  Procedures Performed:  Ct Head Wo Contrast  Result Date: 06/23/2016 CLINICAL DATA:  Headache.  Slurred speech for 1 week.  EXAM: CT HEAD WITHOUT CONTRAST TECHNIQUE: Contiguous axial images were obtained from the base of the skull through the vertex without intravenous contrast. COMPARISON:  Head CT 12/02/2012 FINDINGS: Brain: No evidence of acute infarction, hemorrhage, hydrocephalus, extra-axial collection or mass lesion/mass effect. Age related atrophy and mild chronic small vessel ischemia. Vascular: Atherosclerosis of skullbase vasculature without hyperdense vessel or abnormal calcification. Skull: Normal. Negative for fracture or focal lesion. Sinuses/Orbits: Post presumed bilateral cataract resection. Minimal mucosal thickening of the maxillary sinuses. Paranasal sinuses are otherwise clear. Maxillary air cells are well-aerated. Other: None. IMPRESSION: No acute intracranial abnormality. Electronically Signed   By: Rubye Oaks M.D.   On: 06/23/2016 23:57   Mr Brain Wo Contrast  Result Date: 06/24/2016 CLINICAL DATA:  Slurred speech. EXAM: MRI HEAD WITHOUT CONTRAST TECHNIQUE: Multiplanar, multiecho pulse sequences of the brain and surrounding structures were obtained without intravenous contrast. COMPARISON:  Head CT from 06/23/2016 FINDINGS: Brain: Small area of FLAIR hyperintensity in the midline and right para median splenium. There is a small focus of DWI hyperintensity along the left aspect of the broader signal abnormality. This finding is nonspecific and can be seen with seizure, electrolytes/glucose derangement, infection, and medication toxicity. Infarct is considered unlikely in this location alone. Based on demographics and elsewhere typical for age white matter changes, demyelinating focus is considered unlikely. No noted swelling to suggest this is a mass lesion. No suspected infarction, hemorrhage, hydrocephalus, extra-axial collection or mass lesion. Vascular: Preserved flow voids Skull and upper cervical spine: No marrow lesion noted Sinuses/Orbits: Mucous retention cyst on the bilateral maxillary sinus  floor. Bilateral cataract resection. IMPRESSION: Nonspecific signal abnormality in the splenium of the corpus callosum which can be seen with seizure, metabolic imbalance, and infection. These signal abnormalities are often transient and follow-up is recommended. Electronically Signed   By: Marnee Spring M.D.   On: 06/24/2016 10:13   Dg Knee Complete 4 Views Left  Result Date: 06/24/2016 CLINICAL DATA:  Fall at home today with left knee weakness. EXAM: LEFT KNEE - COMPLETE 4+ VIEW COMPARISON:  None. FINDINGS: No evidence of fracture, dislocation, or joint effusion. Osteoarthritis of the patellofemoral articulation. Minimal medial tibiofemoral joint space narrowing. Faint chondrocalcinosis. Small osteochondroma arising from the proximal distal fibula is incidental. Question of soft tissue edema. IMPRESSION: 1. No acute osseous abnormality.  Mild degenerative change. 2. Question of soft tissue edema. Electronically Signed   By: Rubye Oaks M.D.   On: 06/24/2016 00:21   Dg Abdomen Acute W/chest  Result Date: 06/24/2016 CLINICAL DATA:  Generalized weakness.  Headache and slurred speech. EXAM: DG ABDOMEN ACUTE W/ 1V CHEST COMPARISON:  Chest radiographs 05/10/2016 FINDINGS: Unchanged heart  size and mediastinal contours with thoracic aortic atherosclerosis. Small hiatal hernia again seen. Mild hyperinflation is chronic. Minimal chronic bronchial thickening. No dilated bowel loops to suggest obstruction. Air and stool throughout the colon which is nondilated. No small bowel dilatation. No free intra-abdominal air. No radiopaque calculi, pelvic calcifications are consistent with phleboliths. No acute osseous abnormalities. IMPRESSION: 1. No acute abnormality in the chest or abdomen. 2. Mild chronic hyperinflation bronchial thickening. Thoracic aortic atherosclerosis. 3. No bowel obstruction or free air. Electronically Signed   By: Rubye Oaks M.D.   On: 06/24/2016 00:25   Discharge  Instructions: Discharge Instructions    Call MD for:  extreme fatigue    Complete by:  As directed    Call MD for:  persistant dizziness or light-headedness    Complete by:  As directed    Call MD for:  persistant nausea and vomiting    Complete by:  As directed    Call MD for:  severe uncontrolled pain    Complete by:  As directed    Diet - low sodium heart healthy    Complete by:  As directed    Discharge instructions    Complete by:  As directed    Please work on rebuilding your strength with Physical Therapy. We have changed several of your medications to help treat your symptoms of reflux and your pain and anxiety. It will be important for you to follow up with your Primary Doctor at the appointment we have made for you once you leave the hospital.   Increase activity slowly    Complete by:  As directed       Signed: Carolynn Comment, MD 06/25/2016, 2:22 PM   Pager: [phone]

## 2016-06-25 NOTE — Progress Notes (Signed)
Physical Therapy Treatment Patient Details Name: Sherri Mcmillan MRN: [id number] DOB: 03-01-1933 Today's Date: 06/25/2016    History of Present Illness 83 y.o. female with a PMH of hypertension, hyperlipidemia, diastolic dysfunction, venous insufficiency, depression/anxiety, migraine headaches and GERD and Barrett's esophogus 2/2 hiatal hernia (s/p nissen fundoplication). From 3/21-3/24 she felt nausea and had 3 episodes of black watery emesis. After this she began feeling unsteady on her feet and lightheaded when she stands from seated. She has had two falls this week which she relates to being clumsy.    PT Comments    Patient making improvements with mobility and gait.  Continues to have deficits in balance and safety awareness (had multiple falls at home).  Patient with prn assist, home alone during day.  Continue to recommend ST-SNF at d/c for continued mobility, gait, balance, and safety training.    Follow Up Recommendations  SNF;Supervision for mobility/OOB     Equipment Recommendations  None recommended by PT    Recommendations for Other Services       Precautions / Restrictions Precautions Precautions: Fall Precaution Comments: falls at home per patient Restrictions Weight Bearing Restrictions: No    Mobility  Bed Mobility               General bed mobility comments: Patient in recliner  Transfers Overall transfer level: Needs assistance Equipment used: Rolling walker (2 wheeled) Transfers: Sit to/from Stand Sit to Stand: Min assist         General transfer comment: Verbal cues for hand placement.  Assist for balance/safety.  While returning to chair, patient began to turn and left RW, attempting to walk to chair with no assistive device.  Stopped patient and instructed her to keep RW with her at all times during gait.  Ambulation/Gait Ambulation/Gait assistance: Min guard Ambulation Distance (Feet): 140 Feet Assistive device: Rolling walker (2  wheeled) Gait Pattern/deviations: Step-through pattern;Decreased stride length;Trunk flexed;Shuffle Gait velocity: decreased Gait velocity interpretation: Below normal speed for age/gender General Gait Details: Repeated cues for safe use of RW - keep hands on RW and keep RW with her during gait.  Patient requiring assist for balance/safety.  During turns, cues to keep feet inside RW.  During gait, patient repeatedly ran into obstacles on the right - hosp equipment, door jams, wall, etc.  Cues to move more to left to move away from wall.   Stairs            Wheelchair Mobility    Modified Rankin (Stroke Patients Only)       Balance Overall balance assessment: Needs assistance;History of Falls         Standing balance support: Bilateral upper extremity supported Standing balance-Leahy Scale: Poor Standing balance comment: Requires UE support for balance                    Cognition Arousal/Alertness: Awake/alert Behavior During Therapy: Anxious;Impulsive Overall Cognitive Status: Within Functional Limits for tasks assessed                 General Comments: Decreased safety awareness throughout session.  Required repeated cues to keep hands on RW during gait.    Exercises      General Comments        Pertinent Vitals/Pain Pain Assessment: 0-10 Pain Score: 9  Pain Location: "All over, moving to my face".  Patient stopped during gait, grabbing her face and c/o pain. Pain Descriptors / Indicators: Pins and needles;Grimacing;Moaning Pain Intervention(s): Monitored during session;Patient  requesting pain meds-RN notified    Home Living                      Prior Function            PT Goals (current goals can now be found in the care plan section) Acute Rehab PT Goals Patient Stated Goal: regain independence. feel better.  Progress towards PT goals: Progressing toward goals    Frequency    Min 4X/week      PT Plan Current plan  remains appropriate    Co-evaluation             End of Session Equipment Utilized During Treatment: Gait belt Activity Tolerance: Patient limited by pain Patient left: in chair;with call bell/phone within reach;with chair alarm set;with family/visitor present Nurse Communication: Mobility status;Patient requests pain meds PT Visit Diagnosis: Other abnormalities of gait and mobility (R26.89);Repeated falls (R29.6)     Time: [id number] PT Time Calculation (min) (ACUTE ONLY): 15 min  Charges:  $Gait Training: 8-22 mins                    G Codes:       Vena AustriaSusan H  06/25/2016, 12:33 PM Durenda HurtSusan H. Renaldo Fiddleravis, PT, [facility] Acute Rehab Services Pager [phone]

## 2016-06-25 NOTE — Progress Notes (Signed)
Pt discharge education completed with pt and friend at bedside. All voices understanding and denies any questions. Pt IV and telemetry removed; pt discharge home with friend to transport her home. Pt to pick up electronically sent prescriptions from preferred pharmacy on file. Pt transported off unit via wheelchair with friend and belongings to the side. Dionne BucyP. Amo  RN

## 2016-06-25 NOTE — Progress Notes (Signed)
Occupational Therapy Treatment Patient Details Name: Sherri Mcmillan MRN: [id number] DOB: January 26, 1933 Today's Date: 06/25/2016    History of present illness 83 y.o. female with a PMH of hypertension, hyperlipidemia, diastolic dysfunction, venous insufficiency, depression/anxiety, migraine headaches and GERD and Barrett's esophogus 2/2 hiatal hernia (s/p nissen fundoplication). From 3/21-3/24 she felt nausea and had 3 episodes of black watery emesis. After this she began feeling unsteady on her feet and lightheaded when she stands from seated. She has had two falls this week which she relates to being clumsy.   OT comments  Pt progressing towards acute OT goals. Pt at this time declining SNF and reports 24 hour assist is being arranged. Spoke at length regarding having someone with her any time she is mobilizing. Discussed her fall risk and fall prevention strategies. No family/friends present during session. Session details below. HHOT recommended if pt d/c home.     Follow Up Recommendations  SNF;Supervision/Assistance - 24 hour    Equipment Recommendations  3 in 1 bedside commode    Recommendations for Other Services      Precautions / Restrictions Precautions Precautions: Fall Precaution Comments: falls at home per patient Restrictions Weight Bearing Restrictions: No       Mobility Bed Mobility Overal bed mobility: Needs Assistance             General bed mobility comments: Patient in recliner  Transfers Overall transfer level: Needs assistance Equipment used: Rolling walker (2 wheeled) Transfers: Sit to/from Stand Sit to Stand: Min assist         General transfer comment: cues for technique with rw and safety (somewhat impulsive.) Min A to steady.    Balance Overall balance assessment: Needs assistance;History of Falls         Standing balance support: Bilateral upper extremity supported Standing balance-Leahy Scale: Poor Standing balance comment: external  support for balance                   ADL Overall ADL's : Needs assistance/impaired     Grooming: Min guard;Cueing for safety;Wash/dry hands;Standing;Minimal assistance Grooming Details (indicate cue type and reason): cues for safety. close min guard to occasional min a for balance. external support of sink at times. rw in front of her.                             Functional mobility during ADLs: Minimal assistance General ADL Comments: Pt mostly close min guard with household distance functional mobility. Occasional min A. 1 seated rest break at halfway point. Cues for safety. Discussed at length safety regarding mobilizing, ADLs, and reducing fall risk. Discussed several times making sure someone is walking with her whenever she is OOB at home. Also discussed sponge baths until HHOT can assess safety with tub shower transfers in home setup. Pt reports arrangements are being made for 24 hour assist. Declined 3n1 at this time.      Vision                     Perception     Praxis      Cognition   Behavior During Therapy: Anxious;Impulsive Overall Cognitive Status: No family/caregiver present to determine baseline cognitive functioning                  General Comments: Decreased safety awareness throughout session.  Required repeated cues to keep hands on RW during gait.  Exercises     Shoulder Instructions       General Comments      Pertinent Vitals/ Pain       Pain Assessment: Faces Pain Score: 9  Faces Pain Scale: Hurts whole lot Pain Location: intermittent, sudden, all over, pins and needles Pain Descriptors / Indicators: Pins and needles;Grimacing;Moaning Pain Intervention(s): Limited activity within patient's tolerance;Monitored during session;Repositioned;Utilized relaxation techniques  Home Living                                          Prior Functioning/Environment              Frequency  Min  3X/week        Progress Toward Goals  OT Goals(current goals can now be found in the care plan section)     Acute Rehab OT Goals Patient Stated Goal: regain independence. feel better.  OT Goal Formulation: With patient Time For Goal Achievement: 07/08/16 Potential to Achieve Goals: Good ADL Goals Pt Will Perform Grooming: with modified independence;sitting;standing Pt Will Perform Upper Body Bathing: with min guard assist;sitting Pt Will Perform Lower Body Bathing: sit to/from stand;with min guard assist Pt Will Perform Upper Body Dressing: with min guard assist;sitting Pt Will Perform Lower Body Dressing: with min guard assist;sit to/from stand Pt Will Transfer to Toilet: with min guard assist;ambulating Pt Will Perform Toileting - Clothing Manipulation and hygiene: with min guard assist;sit to/from stand Pt Will Perform Tub/Shower Transfer: with min assist;ambulating;3 in 1;rolling walker  Plan Discharge plan remains appropriate    Co-evaluation                 End of Session Equipment Utilized During Treatment: Gait belt;Rolling walker  OT Visit Diagnosis: Unsteadiness on feet (R26.81);Repeated falls (R29.6);Ataxia, unspecified (R27.0)   Activity Tolerance Patient tolerated treatment well (intermittent "pins and needles" type pain, generalized )   Patient Left     Nurse Communication      Functional Assessment Tool Used: AM-PAC 6 Clicks Daily Activity Functional Limitation: Self care Self Care Current Status ([id number]): At least 40 percent but less than 60 percent impaired, limited or restricted Self Care Goal Status (U0454(G8988): At least 20 percent but less than 40 percent impaired, limited or restricted   Time: 1233-1256 OT Time Calculation (min): 23 min  Charges: OT G-codes **NOT FOR INPATIENT CLASS** Functional Assessment Tool Used: AM-PAC 6 Clicks Daily Activity Functional Limitation: Self care Self Care Current Status (U9811(G8987): At least 40 percent but less  than 60 percent impaired, limited or restricted Self Care Goal Status (B1478(G8988): At least 20 percent but less than 40 percent impaired, limited or restricted OT General Charges $OT Visit: 1 Procedure OT Treatments $Self Care/Home Management : 23-37 mins     Pilar GrammesMathews,  H 06/25/2016, 1:41 PM

## 2016-06-25 NOTE — Care Management Note (Signed)
Case Management Note  Patient Details  Name: Sherri Mcmillan MRN: [id number] Date of Birth: 1932/09/29  Subjective/Objective:                    Action/Plan: CM spoke to patients friend [name] over the phone and he is in agreement with patient returning home. He states he works during the day on week days but is going to have friends and family provide care for her when he is at work. He is available for 24 hour care over the weekend. MD informed of the above and placed orders for [facility] services. CM met with the patient and provided her a list of [facility] agencies. She selected [facility]. Hoyle Sauer with [place] notified and accepted the referral.  Pt states she has a walker at home. When asked about a beside commode she stated she did not want one.  Patients friend [name] to provide transport home.   Expected Discharge Date:  06/25/16               Expected Discharge Plan:  [place]  In-House Referral:     Discharge planning Services  CM Consult  Post Acute Care Choice:  Home Health Choice offered to:  Patient  DME Arranged:    DME Agency:     HH Arranged:  PT [place]:  [place]  Status of Service:  Completed, signed off  If discussed at [place] of Stay Meetings, dates discussed:    Additional Comments:  Pollie Friar, RN 06/25/2016, 4:37 PM

## 2016-06-25 NOTE — Progress Notes (Signed)
Pt from home with her friend, Milton FergusonVal, who she has been living with for 40 years. Pt states she has 3 steps to enter her one story home. She states [name] is able to assist her at home as needed. CM discussed going to rehab prior to going home and currently patient is against going to SNF rehab. She stated she wanted to discuss it further with [name] but her decision is to go home. [facility] if she does d/c home. CM continuing to follow.

## 2016-06-26 LAB — BPAM RBC
BLOOD PRODUCT EXPIRATION DATE: 201803282359
BLOOD PRODUCT EXPIRATION DATE: 201803282359
Unit Type and Rh: 5100
Unit Type and Rh: 5100

## 2016-06-26 LAB — TYPE AND SCREEN
ABO/RH(D): O POS
ANTIBODY SCREEN: POSITIVE
DAT, IGG: POSITIVE
Unit division: 0
Unit division: 0

## 2016-06-26 LAB — VITAMIN D 25 HYDROXY (VIT D DEFICIENCY, FRACTURES): VIT D 25 HYDROXY: 5.6 ng/mL — AB (ref 30.0–100.0)

## 2016-06-29 DIAGNOSIS — I872 Venous insufficiency (chronic) (peripheral): Secondary | ICD-10-CM | POA: Diagnosis not present

## 2016-06-29 DIAGNOSIS — R270 Ataxia, unspecified: Secondary | ICD-10-CM | POA: Diagnosis not present

## 2016-06-29 DIAGNOSIS — K219 Gastro-esophageal reflux disease without esophagitis: Secondary | ICD-10-CM | POA: Diagnosis not present

## 2016-06-29 DIAGNOSIS — I10 Essential (primary) hypertension: Secondary | ICD-10-CM | POA: Diagnosis not present

## 2016-06-29 DIAGNOSIS — E785 Hyperlipidemia, unspecified: Secondary | ICD-10-CM | POA: Diagnosis not present

## 2016-06-29 DIAGNOSIS — D500 Iron deficiency anemia secondary to blood loss (chronic): Secondary | ICD-10-CM | POA: Diagnosis not present

## 2016-06-29 DIAGNOSIS — F419 Anxiety disorder, unspecified: Secondary | ICD-10-CM | POA: Diagnosis not present

## 2016-06-29 DIAGNOSIS — F329 Major depressive disorder, single episode, unspecified: Secondary | ICD-10-CM | POA: Diagnosis not present

## 2016-06-30 ENCOUNTER — Telehealth: Payer: Self-pay | Admitting: *Deleted

## 2016-06-30 ENCOUNTER — Ambulatory Visit (INDEPENDENT_AMBULATORY_CARE_PROVIDER_SITE_OTHER): Payer: Medicare HMO | Admitting: Internal Medicine

## 2016-06-30 DIAGNOSIS — E559 Vitamin D deficiency, unspecified: Secondary | ICD-10-CM | POA: Diagnosis not present

## 2016-06-30 DIAGNOSIS — R51 Headache: Secondary | ICD-10-CM | POA: Diagnosis not present

## 2016-06-30 DIAGNOSIS — R260 Ataxic gait: Secondary | ICD-10-CM | POA: Diagnosis not present

## 2016-06-30 DIAGNOSIS — R270 Ataxia, unspecified: Secondary | ICD-10-CM | POA: Diagnosis not present

## 2016-06-30 DIAGNOSIS — R4781 Slurred speech: Secondary | ICD-10-CM | POA: Diagnosis not present

## 2016-06-30 DIAGNOSIS — G44229 Chronic tension-type headache, not intractable: Secondary | ICD-10-CM

## 2016-06-30 DIAGNOSIS — K2100 Gastro-esophageal reflux disease with esophagitis, without bleeding: Secondary | ICD-10-CM

## 2016-06-30 DIAGNOSIS — Z5189 Encounter for other specified aftercare: Secondary | ICD-10-CM | POA: Diagnosis not present

## 2016-06-30 DIAGNOSIS — R202 Paresthesia of skin: Secondary | ICD-10-CM | POA: Diagnosis not present

## 2016-06-30 DIAGNOSIS — K219 Gastro-esophageal reflux disease without esophagitis: Secondary | ICD-10-CM

## 2016-06-30 LAB — GLUCOSE, CAPILLARY: Glucose-Capillary: 85 mg/dL (ref 65–99)

## 2016-06-30 LAB — POCT GLYCOSYLATED HEMOGLOBIN (HGB A1C): Hemoglobin A1C: 5.6

## 2016-06-30 MED ORDER — VITAMIN D (ERGOCALCIFEROL) 1.25 MG (50000 UNIT) PO CAPS: 50000.0000 [IU] | ORAL_CAPSULE | ORAL | 0 refills | Status: DC

## 2016-06-30 MED ORDER — METOCLOPRAMIDE HCL 10 MG PO TABS: 10.0000 mg | ORAL_TABLET | Freq: Three times a day (TID) | ORAL | 0 refills | Status: DC

## 2016-06-30 NOTE — Telephone Encounter (Signed)
Received call from pharmacy-pt prescribed metoclopramide, but it has a "Level 2" interaction with  lexapro.  Pharmacy will need confirmation from MD stating it's ok to fill-medication placed on hold.  Will send info to MD for review and medication change if appropriate.Kingsley SpittleGoldston, Darlene [name]3/7/201811:49 AM

## 2016-06-30 NOTE — Progress Notes (Signed)
CC: hospital follow up   HPI:  [name] is a 83 y.o. with pmh as listed below is here for hospital follow up for slurred speech and ataxia.   Past Medical History:  Diagnosis Date  . Anemia 12/14/2012  . Aortic stenosis    Dx ECHO 2008 , mild and aymptomatic , needs ECHO q 3-5 yrs  . Barrett's esophagus    Demonstrated on EGD 12/2010. EGD 09/17/2012 shows inflamed GE junctional mucosa without metaplasia, dysplasia, or malignancy.  . Bronchitis    hx of  . Cataract   . Chronic anxiety    [facility]  for [facility] weeks after mother died. Complicated again by death of her sister 2010. 2012 developed hallucinations and I refused to refill controlled meds unless she see psych which she is not agreable to  . Chronic insomnia   . Chronic pain    DJD knees, back, migranes, LLE varicose veins, and LLE neuropathy.   . Depression   . DVT (deep venous thrombosis) (HCC)   . Elevated cholesterol 10/11   LDL 155. Her 10 year risk (decreasing her age to [age] as the calculator won't go to age 83) is 21% ish. If consider her non smoker (which I wouldn't even though she says 1 pak lasts 2 weeks) risk is 12% ish. So goal for LDL is 130 or 100.  Marland Kitchen. Gastroparesis    Demonstrated on GES 12/2010 by Dr Dalene SeltzerKapland  . GERD (gastroesophageal reflux disease)   . H/O hiatal hernia   . HTN (hypertension)    Controlled with 2 drug therapy  . Left leg DVT (HCC) 09/28/2012  . Migraine    sometimes   Hospitalized 2/28-3/2 with slurred speech and ataxia thought to be 2/2 to hypovolemia from viral gastroenteritis (vomitted 1 week and had low PO intake). For her anxiety and depression, celexa was switched to lexapro. She had worsening GERD symptoms as well thought to be 2/2 to PPI noncompliance and emesis. Vitamin D level was low at 5.6. Ferritin was low 7, hgb 8.9 last, started PO iron. PT recommended SNF but patient declined and [facility]   MRI in hospital was negative for stroke.  Showed: "Nonspecific signal abnormality in the splenium of the corpus callosum which can be seen with seizure, metabolic imbalance, and infection. These signal abnormalities are often transient and follow-up is recommended."  Patient also had headache and paresthesia in the hospital which she continues to have. Has tingling all over her body. Feels like "nerve stickers". HA is bi-temporal throbbing, comes and goes, had this chronically but worsening for last 2 weeks months. No changes in vision. No hx of thyroid problems (no easily getting hot or cold, no constipation or diarrhea, has some dry mouth but no other dryness).   Continues to have ataxia and speech abnl. Continues to have GERD symptoms. Compliant with BID PPI. Had EGD with Dr. Arlyce DiceKaplan on 2014 which showed esophageal stricture and erosive esophagitis. No further nausea/vomitting. No GI bleed.    Review of Systems:    Review of Systems  Constitutional: Negative for chills and fever.  Eyes: Negative for blurred vision, double vision and photophobia.  Cardiovascular: Negative for chest pain and palpitations.  Gastrointestinal: Negative for abdominal pain, heartburn, nausea and vomiting.  Genitourinary: Negative for dysuria.  Skin: Negative for rash.  Neurological: Positive for tingling and headaches. Negative for dizziness.       Ataxia  Endo/Heme/Allergies: Negative for environmental  allergies. Does not bruise/bleed easily.  Psychiatric/Behavioral: Negative for depression.     Physical Exam:  Vitals:   06/30/16 1028  BP: 123/68  Pulse: (!) 58  Temp: 98.1 F (36.7 C)  TempSrc: Oral  SpO2: 100%  Weight: 139 lb 9.6 oz (63.3 kg)  Height: 5\' 1"  (1.549 m)   Physical Exam  Constitutional: She is oriented to person, place, and time.  Pleasant female. NAD. Sitting in chair with her walker in front of her.   HENT:  Head: Normocephalic and atraumatic.  Has tenderness over her b/l temples.   Eyes: Conjunctivae are normal.  Right eye exhibits no discharge. Left eye exhibits no discharge.  Left pupil is abnormally shaped, had cataract. Right pupil is round and reactive.   Neck: Normal range of motion.  Cardiovascular: Normal rate and regular rhythm.   Systolic murmur present  Respiratory: Effort normal and breath sounds normal. No respiratory distress. She has no wheezes.  GI: Soft. Bowel sounds are normal. She exhibits no distension. There is no tenderness.  Musculoskeletal: Normal range of motion. She exhibits no edema or deformity.  Neurological: She is alert and oriented to person, place, and time. No cranial nerve deficit.  Has good strength. Sensation intact b/l. Has delay in getting up from the chair and has cautious/ataxic gait.     Assessment & Plan:   See Encounters Tab for problem based charting.  Patient seen with Dr. Josem Kaufmann

## 2016-06-30 NOTE — Assessment & Plan Note (Signed)
Vitamin D level was 5.6. Started 50,000 Units of vitamin D weekly for 8 weeks.

## 2016-06-30 NOTE — Assessment & Plan Note (Signed)
Unclear etiology of HA, this could be tension headache as she is quite stressed out about her ataxia and tingling sensation, also could be 2/2 to meds (SSRI can cause headaches). DDX also includes GCA as she does have some tenderness over b/l temples and temporal arteries. ESR was 38 recently which is not significantly elevated for her age.    On MRI recently she had Nonspecific signal abnormality in the splenium of the corpus Callosum, I don't think this would cause her headache.  Will treat supportively with reglan for now. It can have interaction with SSRI. However, tylenol is not that helpful, can't do NSAIDs due to GERD, and I don't want to give her opiates with her ataxia.   In the future, may refer to neurology.

## 2016-06-30 NOTE — Assessment & Plan Note (Addendum)
Patient has ataxia and paresthesia of unknown etiology. She has paresthesia all over her body which suggests possible generalized neuropathy or neuro degeneration. MRI showed Nonspecific signal abnormality in the splenium of the corpus Callosum. She is also having speech changes with this and headache which are all concerning.  - will do initial working including HIV, Hep C, RPR, B12, MMA, hgba1c, TSH. If all are negative, will urgently refer to neurology as she may need further workup for this including LP possibly. Also will likely benefit from repeat MRI in the future to evaluate the above mentioned abnormality.  - cont to work with PT.

## 2016-06-30 NOTE — Progress Notes (Signed)
I saw and evaluated the patient.  I personally confirmed the key portions of Dr. Marcelino FreestoneAhmed's history and exam and reviewed pertinent patient test results.  The assessment, diagnosis, and plan were formulated together and I agree with the documentation in the resident's note.  The cause of her symptoms are not clear, but we remain concerned about a neurodegenerative process given her symptoms and MRI findings.  In addition, we are concerned about a myelopathy given her ataxia and iron deficiency with a normal colonoscopy in 2012.  This suggests some type of absorption issue for iron (in the stomach/duodenum), and may also predispose to a vitamin B12 deficiency secondary to absorption issues with an intrinsic factor deficit in the stomach or absorption issues in the ileum.  Hence why we are checking a B12 level.  If our evaluation is negative we will consider referral to Neurology referral given her new neurologic deficits without definitive explanation.

## 2016-06-30 NOTE — Telephone Encounter (Signed)
I think it would be fine. We don't have other good choices for the headache. Can't use NSAID due to her GERD. I will not give her opiate with her ataxia/falls.

## 2016-06-30 NOTE — Assessment & Plan Note (Signed)
Continues to have GERD symptoms. Compliant with BID PPI. Had EGD with Dr. Arlyce DiceKaplan on 2014 which showed esophageal stricture and erosive esophagitis. No further nausea/vomitting. No GI bleed.   - will cont PPI bid for now. If fails to improve, will refer to GI.

## 2016-06-30 NOTE — Patient Instructions (Signed)
Please take reglan for headache along with tylenol.  We will get blood work today to figure out what's going on.  Continue to take your reflux medication. We may refer you to GI doctor in the future.  Follow up in 2 weeks.

## 2016-07-01 NOTE — Telephone Encounter (Signed)
Pharmacy aware, phone call complete.Sherri Mcmillan,  [name]3/8/20181:50 PM

## 2016-07-02 LAB — HIV ANTIBODY (ROUTINE TESTING W REFLEX): HIV Screen 4th Generation wRfx: NONREACTIVE

## 2016-07-02 LAB — METHYLMALONIC ACID, SERUM: Methylmalonic Acid: 314 nmol/L (ref 0–378)

## 2016-07-02 LAB — TSH: TSH: 1.4 u[IU]/mL (ref 0.450–4.500)

## 2016-07-02 LAB — RPR: RPR Ser Ql: NONREACTIVE

## 2016-07-02 LAB — VITAMIN B12: Vitamin B-12: 363 pg/mL (ref 232–1245)

## 2016-07-02 LAB — HEPATITIS C ANTIBODY: HEP C VIRUS AB: 0.1 {s_co_ratio} (ref 0.0–0.9)

## 2016-07-02 NOTE — Addendum Note (Signed)
Addended by: Hyacinth MeekerAHMED,  on: 07/02/2016 03:57 PM   Modules accepted: Orders

## 2016-07-06 ENCOUNTER — Encounter: Payer: Self-pay | Admitting: Neurology

## 2016-07-06 DIAGNOSIS — E785 Hyperlipidemia, unspecified: Secondary | ICD-10-CM | POA: Diagnosis not present

## 2016-07-06 DIAGNOSIS — F419 Anxiety disorder, unspecified: Secondary | ICD-10-CM | POA: Diagnosis not present

## 2016-07-06 DIAGNOSIS — I10 Essential (primary) hypertension: Secondary | ICD-10-CM | POA: Diagnosis not present

## 2016-07-06 DIAGNOSIS — K219 Gastro-esophageal reflux disease without esophagitis: Secondary | ICD-10-CM | POA: Diagnosis not present

## 2016-07-06 DIAGNOSIS — D500 Iron deficiency anemia secondary to blood loss (chronic): Secondary | ICD-10-CM | POA: Diagnosis not present

## 2016-07-06 DIAGNOSIS — I872 Venous insufficiency (chronic) (peripheral): Secondary | ICD-10-CM | POA: Diagnosis not present

## 2016-07-06 DIAGNOSIS — F329 Major depressive disorder, single episode, unspecified: Secondary | ICD-10-CM | POA: Diagnosis not present

## 2016-07-06 DIAGNOSIS — R270 Ataxia, unspecified: Secondary | ICD-10-CM | POA: Diagnosis not present

## 2016-07-12 DIAGNOSIS — K219 Gastro-esophageal reflux disease without esophagitis: Secondary | ICD-10-CM | POA: Diagnosis not present

## 2016-07-12 DIAGNOSIS — D500 Iron deficiency anemia secondary to blood loss (chronic): Secondary | ICD-10-CM | POA: Diagnosis not present

## 2016-07-12 DIAGNOSIS — F419 Anxiety disorder, unspecified: Secondary | ICD-10-CM | POA: Diagnosis not present

## 2016-07-12 DIAGNOSIS — F329 Major depressive disorder, single episode, unspecified: Secondary | ICD-10-CM | POA: Diagnosis not present

## 2016-07-12 DIAGNOSIS — I872 Venous insufficiency (chronic) (peripheral): Secondary | ICD-10-CM | POA: Diagnosis not present

## 2016-07-12 DIAGNOSIS — R270 Ataxia, unspecified: Secondary | ICD-10-CM | POA: Diagnosis not present

## 2016-07-12 DIAGNOSIS — E785 Hyperlipidemia, unspecified: Secondary | ICD-10-CM | POA: Diagnosis not present

## 2016-07-12 DIAGNOSIS — I10 Essential (primary) hypertension: Secondary | ICD-10-CM | POA: Diagnosis not present

## 2016-07-14 ENCOUNTER — Ambulatory Visit (INDEPENDENT_AMBULATORY_CARE_PROVIDER_SITE_OTHER): Payer: Medicare HMO | Admitting: Internal Medicine

## 2016-07-14 VITALS — BP 135/68 | HR 71 | Temp 98.2°F | Ht 60.0 in | Wt 134.0 lb

## 2016-07-14 DIAGNOSIS — R296 Repeated falls: Secondary | ICD-10-CM | POA: Diagnosis not present

## 2016-07-14 DIAGNOSIS — Z9181 History of falling: Secondary | ICD-10-CM

## 2016-07-14 DIAGNOSIS — K219 Gastro-esophageal reflux disease without esophagitis: Secondary | ICD-10-CM | POA: Diagnosis not present

## 2016-07-14 DIAGNOSIS — R011 Cardiac murmur, unspecified: Secondary | ICD-10-CM | POA: Diagnosis not present

## 2016-07-14 DIAGNOSIS — M25461 Effusion, right knee: Secondary | ICD-10-CM

## 2016-07-14 DIAGNOSIS — G4700 Insomnia, unspecified: Secondary | ICD-10-CM

## 2016-07-14 DIAGNOSIS — R270 Ataxia, unspecified: Secondary | ICD-10-CM

## 2016-07-14 DIAGNOSIS — G44229 Chronic tension-type headache, not intractable: Secondary | ICD-10-CM

## 2016-07-14 DIAGNOSIS — M25562 Pain in left knee: Secondary | ICD-10-CM | POA: Diagnosis not present

## 2016-07-14 DIAGNOSIS — R260 Ataxic gait: Secondary | ICD-10-CM | POA: Diagnosis not present

## 2016-07-14 DIAGNOSIS — M25462 Effusion, left knee: Secondary | ICD-10-CM | POA: Diagnosis not present

## 2016-07-14 DIAGNOSIS — R51 Headache: Secondary | ICD-10-CM

## 2016-07-14 LAB — SYNOVIAL CELL COUNT + DIFF, W/ CRYSTALS
Crystals, Fluid: NONE SEEN
Eosinophils-Synovial: NONE SEEN % (ref 0–1)
Lymphocytes-Synovial Fld: 32 % — ABNORMAL HIGH (ref 0–20)
MONOCYTE-MACROPHAGE-SYNOVIAL FLUID: 50 % (ref 50–90)
Neutrophil, Synovial: 18 % (ref 0–25)
WBC, Synovial: 62 /mm3 (ref 0–200)

## 2016-07-14 MED ORDER — DICLOFENAC SODIUM 1 % TD GEL: 4.0000 g | Freq: Four times a day (QID) | TRANSDERMAL | 2 refills | Status: DC

## 2016-07-14 MED ORDER — MELATONIN 1 MG PO TABS: 2.0000 | ORAL_TABLET | Freq: Every day | ORAL | 2 refills | Status: DC

## 2016-07-14 NOTE — Assessment & Plan Note (Signed)
Patient continues to have ataxia and falls. Fell yesterday after tripping on an object in her living room. Has home PT, still does not want to go to rehab as we suggested in the past. This could be multifactorial from knee pain causing imbalance and also more central neurodegenerative.  - avoid centrally acting meds.  - continue home PT - neurology appt tomorrow.

## 2016-07-14 NOTE — Assessment & Plan Note (Addendum)
Continues to have headache likely tension headache as she is stressed out from all the health issues she is going through including her ataxia, falls, knee pain. Will continue tylenol for now, unable to do any centrally acting meds due to her falls. She had abnormal MRI signals in the splenium of the corpous callosum but that is not likely the cause of the headache.  - f/up with neurology for their recs.

## 2016-07-14 NOTE — Assessment & Plan Note (Signed)
Continues to have left knee pain for 1 month. POC u/s showed mod-large effusion in the suprapatellar bursa of the left knee. We were able to aspirate 5-6 cc of fluid from the knee but patient could not tolerate the procedure further so we were not able to inject any steroid today.  - sent fluid for cell count, gram stain, culture, crystal analysis.  - continue supportive care with tylenol. Can't do NSAID due to GERD symptoms. Can't do centrally acting meds with her frequent falls. Prescribed voltaren gel, may not be high yield but our options are limited. - if continues to have pain we can re-attempt knee steroid injection or refer to ortho.

## 2016-07-14 NOTE — Progress Notes (Signed)
Internal Medicine Clinic Attending  I saw and evaluated the patient.  I personally confirmed the key portions of the history and exam documented by Dr. Tasia Catchings and I reviewed pertinent patient test results.  The assessment, diagnosis, and plan were formulated together and I agree with the documentation in the resident's note.  I was present for the entirety of the procedure. POCUS showed a large effusion in the left knee and a small-moderate effusion in the right knee. Given her significant tenderness of the right knee with passive range of motion, we recommended arthrocentesis for symptomatic relief of the large effusion and with plan for intra-articular steroid injection. The patient was very sensitive to pain. We used cold spray for the superficial skin, and local anesthesia with 1% lidocaine along the needle track. We placed the needle into the suprapatellar pouch using ultrasound guidance, and shortly after we encountered fluid the patient cried out telling us to stop. Needle was removed at that point. We had aspirated only 5 mL, and were unable to complete the steroid injection. The joint fluid analysis ruled out joint infection and crystal arthritis, leaving OA as the likely cause of her pain. Plan is for continued conservative management and offer her orthopedic referral.   Sagittal view of the left knee showing a large free flowing effusion in the suprapatellar pouch.

## 2016-07-14 NOTE — Progress Notes (Signed)
CC: knee pain and headache   HPI:  [name] is a 83 y.o. with pmh as listed below is here for f/up for her left knee pain and headache  Last visit we were concerned about her overall body tingling, ataxia, headache and abnormal MRI finding during her previous hospitalization. Initial lab work was done to evaluate for neuropathy/neurodegeneration including hgba1c, TSH, HIV, Hep C, RPR, B12. Vitamin D was low in 5's and we started vitamin 50,000 units weekly. We referred to neurology, has appt tomorrow.   Continues to have headache (bitemporal, tylenol is not helping much, reglan is not helping either). Has left knee pain, was previously seen for this on February, MRI ;eft ordered but not done. Xray of left knee showed mild deg changes and some ?soft tissue edema. Continues to have pain on this and swelling and walking difficulty.   Continues to fall, even with PT at home, fell yesterday. States that she rushes to walk and that leads to falling.fell due to tripping on something. Likes the home physical therapist.   Complaining about insomnia, has not been taking the melatonin like we suggested before.  Not taking iron - makes her nauseous.   Past Medical History:  Diagnosis Date  . Anemia 12/14/2012  . Aortic stenosis    Dx ECHO 2008 , mild and aymptomatic , needs ECHO q 3-5 yrs  . Barrett's esophagus    Demonstrated on EGD 12/2010. EGD 09/17/2012 shows inflamed GE junctional mucosa without metaplasia, dysplasia, or malignancy.  . Bronchitis    hx of  . Cataract   . Chronic anxiety    Admission to [facility] (90s or early 2000)  for 6 weeks after mother died. Complicated again by death of her sister 2010. 2012 developed hallucinations and I refused to refill controlled meds unless she see psych which she is not agreable to  . Chronic insomnia   . Chronic pain    DJD knees, back, migranes, LLE varicose veins, and LLE neuropathy.   . Depression   . DVT (deep venous thrombosis) (HCC)    . Elevated cholesterol 10/11   LDL 155. Her 10 year risk (decreasing her age to 30 as the calculator won't go to age 74) is 21% ish. If consider her non smoker (which I wouldn't even though she says 1 pak lasts 2 weeks) risk is 12% ish. So goal for LDL is 130 or 100.  Marland Kitchen Gastroparesis    Demonstrated on GES 12/2010 by Dr Dalene Seltzer  . GERD (gastroesophageal reflux disease)   . H/O hiatal hernia   . HTN (hypertension)    Controlled with 2 drug therapy  . Left leg DVT (HCC) 09/28/2012  . Migraine    sometimes    Review of Systems:   Review of Systems  Constitutional: Negative for chills and fever.  Cardiovascular: Negative for chest pain.  Gastrointestinal: Negative for heartburn, nausea and vomiting.  Musculoskeletal: Positive for falls and joint pain.  Neurological: Positive for headaches.     Physical Exam:  Vitals:   07/14/16 1018  BP: 135/68  Pulse: 71  Temp: 98.2 F (36.8 C)  TempSrc: Oral  SpO2: 100%  Weight: 134 lb (60.8 kg)  Height: 5' (1.524 m)   Physical Exam  Constitutional: She is oriented to person, place, and time. She appears well-developed and well-nourished. No distress.  HENT:  Head: Normocephalic and atraumatic.  Mouth/Throat: No oropharyngeal exudate.  Eyes: Conjunctivae are normal.  Cardiovascular: Normal rate and regular rhythm.  Systolic murmur   Respiratory: Effort normal and breath sounds normal. No respiratory distress. She has no wheezes.  Musculoskeletal:  Has tenderness over the knee joint anteriorly and both medially and laterally. We performed POC u/s which showed moderate-large effusion of the left knee in the suprapatellar bursa.   Neurological: She is alert and oriented to person, place, and time.  Skin: She is not diaphoretic.    Knee Arthrocentesis without Injection Procedure Note  Diagnosis: left knee pain and swelling  Indications: Unexplained joint effusion  Anesthesia: Lidocaine 1% without epinephrine  Procedure Details    Point of care ultrasound was used to identify the joint effusion and plan needle trajectory and depth. Consent was obtained for the procedure. The joint was prepped with Betadine and patient was skin was anesthesized with 1% lidocaine.  A 22 gauge needle was inserted into the superior aspect of the joint from a medial approach to access the suprapatellar pouch. 6 ml of clear yellow fluid was removed from the suprapatellar bursa. Patient was having significant pain despite using anesthetics so we were not able to inject steroid in to the joint. The needle was removed and the area cleansed and dressed.  Fluid was sent for cell count, crystal analysis, gram stain, and culture.  Complications:  No complications, patient had significant pain during the procedure.     Assessment & Plan:    See Encounters Tab for problem based charting.  Patient seen with Dr. Oswaldo DoneVincent

## 2016-07-14 NOTE — Patient Instructions (Signed)
Please continue to take tylenol for pain.  Use voltaren gel for your knees.  Keep using your current blood pressure medications.

## 2016-07-14 NOTE — Assessment & Plan Note (Signed)
Continues to have insomnia, likely from the knee pain or worrying about her medical problems. I reminded and to take melatonin (sent again to her pharmacy). Avoid sedating meds.

## 2016-07-16 DIAGNOSIS — E785 Hyperlipidemia, unspecified: Secondary | ICD-10-CM | POA: Diagnosis not present

## 2016-07-16 DIAGNOSIS — D500 Iron deficiency anemia secondary to blood loss (chronic): Secondary | ICD-10-CM | POA: Diagnosis not present

## 2016-07-16 DIAGNOSIS — K219 Gastro-esophageal reflux disease without esophagitis: Secondary | ICD-10-CM | POA: Diagnosis not present

## 2016-07-16 DIAGNOSIS — I10 Essential (primary) hypertension: Secondary | ICD-10-CM | POA: Diagnosis not present

## 2016-07-16 DIAGNOSIS — R270 Ataxia, unspecified: Secondary | ICD-10-CM | POA: Diagnosis not present

## 2016-07-16 DIAGNOSIS — I872 Venous insufficiency (chronic) (peripheral): Secondary | ICD-10-CM | POA: Diagnosis not present

## 2016-07-16 DIAGNOSIS — F419 Anxiety disorder, unspecified: Secondary | ICD-10-CM | POA: Diagnosis not present

## 2016-07-16 DIAGNOSIS — F329 Major depressive disorder, single episode, unspecified: Secondary | ICD-10-CM | POA: Diagnosis not present

## 2016-07-17 LAB — BODY FLUID CULTURE: CULTURE: NO GROWTH

## 2016-07-20 ENCOUNTER — Telehealth: Payer: Self-pay

## 2016-07-20 DIAGNOSIS — K219 Gastro-esophageal reflux disease without esophagitis: Secondary | ICD-10-CM | POA: Diagnosis not present

## 2016-07-20 DIAGNOSIS — R270 Ataxia, unspecified: Secondary | ICD-10-CM | POA: Diagnosis not present

## 2016-07-20 DIAGNOSIS — I10 Essential (primary) hypertension: Secondary | ICD-10-CM | POA: Diagnosis not present

## 2016-07-20 DIAGNOSIS — E785 Hyperlipidemia, unspecified: Secondary | ICD-10-CM | POA: Diagnosis not present

## 2016-07-20 DIAGNOSIS — F419 Anxiety disorder, unspecified: Secondary | ICD-10-CM | POA: Diagnosis not present

## 2016-07-20 DIAGNOSIS — F329 Major depressive disorder, single episode, unspecified: Secondary | ICD-10-CM | POA: Diagnosis not present

## 2016-07-20 DIAGNOSIS — D500 Iron deficiency anemia secondary to blood loss (chronic): Secondary | ICD-10-CM | POA: Diagnosis not present

## 2016-07-20 DIAGNOSIS — I872 Venous insufficiency (chronic) (peripheral): Secondary | ICD-10-CM | POA: Diagnosis not present

## 2016-07-20 NOTE — Telephone Encounter (Signed)
Needs to speak with a nurse about acid reflux med. Please call pt back.

## 2016-07-20 NOTE — Telephone Encounter (Signed)
Called both #'s [employer] a man told me to call the other # Call the [phone]# twice, lm for rtc

## 2016-07-22 ENCOUNTER — Ambulatory Visit (INDEPENDENT_AMBULATORY_CARE_PROVIDER_SITE_OTHER): Payer: Medicare HMO | Admitting: Neurology

## 2016-07-22 ENCOUNTER — Encounter: Payer: Self-pay | Admitting: Neurology

## 2016-07-22 ENCOUNTER — Other Ambulatory Visit: Payer: Self-pay | Admitting: Internal Medicine

## 2016-07-22 VITALS — BP 118/80 | HR 81 | Ht 60.0 in | Wt 128.1 lb

## 2016-07-22 DIAGNOSIS — F329 Major depressive disorder, single episode, unspecified: Secondary | ICD-10-CM | POA: Diagnosis not present

## 2016-07-22 DIAGNOSIS — F419 Anxiety disorder, unspecified: Secondary | ICD-10-CM | POA: Diagnosis not present

## 2016-07-22 DIAGNOSIS — R51 Headache: Secondary | ICD-10-CM | POA: Diagnosis not present

## 2016-07-22 DIAGNOSIS — R9082 White matter disease, unspecified: Secondary | ICD-10-CM

## 2016-07-22 DIAGNOSIS — I872 Venous insufficiency (chronic) (peripheral): Secondary | ICD-10-CM | POA: Diagnosis not present

## 2016-07-22 DIAGNOSIS — D500 Iron deficiency anemia secondary to blood loss (chronic): Secondary | ICD-10-CM | POA: Diagnosis not present

## 2016-07-22 DIAGNOSIS — K219 Gastro-esophageal reflux disease without esophagitis: Secondary | ICD-10-CM | POA: Diagnosis not present

## 2016-07-22 DIAGNOSIS — E785 Hyperlipidemia, unspecified: Secondary | ICD-10-CM | POA: Diagnosis not present

## 2016-07-22 DIAGNOSIS — R519 Headache, unspecified: Secondary | ICD-10-CM

## 2016-07-22 DIAGNOSIS — I10 Essential (primary) hypertension: Secondary | ICD-10-CM | POA: Diagnosis not present

## 2016-07-22 DIAGNOSIS — R270 Ataxia, unspecified: Secondary | ICD-10-CM | POA: Diagnosis not present

## 2016-07-22 MED ORDER — TIZANIDINE HCL 2 MG PO TABS: 2.0000 mg | ORAL_TABLET | Freq: Every evening | ORAL | 3 refills | Status: DC | PRN

## 2016-07-22 MED ORDER — NORTRIPTYLINE HCL 10 MG PO CAPS: ORAL_CAPSULE | ORAL | 3 refills | Status: DC

## 2016-07-22 NOTE — Progress Notes (Signed)
[facility] Clinic Note - Initial Visit   Date: 07/22/16  Sherri Mcmillan MRN: [id number] DOB: 02-12-1933   Dear Dr. Mitzie Na:  Thank you for your kind referral of Sherri Mcmillan for consultation of headache and ataxia. Although her history is well known to you, please allow [place] to reiterate it for the purpose of our medical record. The patient was accompanied to the clinic by caregiver, [name], who also provides collateral information.     History of Present Illness: Sherri Mcmillan is a 83 y.o. right-handed African American female with hyperlipidemia, depression, hypertension, GERD, iron deficiency anemia presenting for evaluation of headaches and ataxia.    Starting around January, she started having headaches.  Headaches start in her jaw and radiate up her cheek and forehead, pain is sharp and throbbing.  Pain is constant and not relieved by anything.  She endorses mild nausea and vomiting and photophobia.  She was started on propranolol 84m, without any improvement.  She takes tylenol 5051mthree times daily, but it does not provide relief.   She was hospitalized in early March for falls, slurred speech and coffee ground emesis. CT head was negative.  MRI brain did not show acute stroke, but there was hyperintensity of the corpus callosum.  Her symptoms were thought to be due to dehydration and improved with IVF.  Since being discharged home, she no longer has imbalance or slurred speech.  She has left knee pain and states that some of home of her falls may be due to knee buckling from pain.    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  She denies any prior history of stroke, heart disease, or cancer.  She is up-to-date with her cancer screening.  Out-side paper records, electronic medical record, and images have been reviewed where available and summarized as:  MRI brain wo contrast 06/24/2016:  Nonspecific signal abnormality in the splenium of the  corpus callosum which can be seen with seizure, metabolic imbalance, and infection. These signal abnormalities are often transient and follow-up is recommended.  Labs 06/30/2016:  Vitamin B12 363, MMA 314, RPR neg, HIV neg, TSH 1.40  Past Medical History:  Diagnosis Date  . Anemia 12/14/2012  . Aortic stenosis    Dx ECHO 2008 , mild and aymptomatic , needs ECHO q 3-5 yrs  . Barrett's esophagus    Demonstrated on EGD 12/2010. EGD 09/17/2012 shows inflamed GE junctional mucosa without metaplasia, dysplasia, or malignancy.  . Bronchitis    hx of  . Cataract   . Chronic anxiety    [facility]  [facility] weeks after mother died. Complicated again by death of her sister 2010. 2012 developed hallucinations and I refused to refill controlled meds unless she see psych which she is not agreable to  . Chronic insomnia   . Chronic pain    DJD knees, back, migranes, LLE varicose veins, and LLE neuropathy.   . Depression   . DVT (deep venous thrombosis) ([facility]  . Elevated cholesterol 10/11   LDL 155. Her 10 year risk (decreasing her age to [age]s the calculator won't go to age [age]is 21% ish. If consider her non smoker (which I wouldn't even though she says 1 pak lasts 2 weeks) risk is 12% ish. So goal for LDL is 130 or 100.  . Marland Kitchenastroparesis    Demonstrated on GES 12/2010 by Dr KaKarmen Stabs. GERD (gastroesophageal reflux disease)   .  H/O hiatal hernia   . HTN (hypertension)    Controlled with 2 drug therapy  . Left leg DVT ([place]) 09/28/2012  . Migraine    sometimes    Past Surgical History:  Procedure Laterality Date  . ABDOMINAL HYSTERECTOMY    . APPENDECTOMY    . CATARACT EXTRACTION     left eye  . COLONOSCOPY  2000&2005   Dr.Magod  . DIRECT LARYNGOSCOPY   September 2008    preoperative diagnosis hoarseness with anterior right vocal cord lesion -  direct laryngoscopy and excisional biopsy of right anterior vocal cord lesion done by Dr. Lucia Gaskins  . ENDOVENOUS ABLATION SAPHENOUS  VEIN W/ LASER Left 05-09-2014   EVLA left small saphenous vein by Curt Jews MD  . ESOPHAGOGASTRODUODENOSCOPY  11/2010  . ESOPHAGOGASTRODUODENOSCOPY N/A 09/17/2012   Procedure: ESOPHAGOGASTRODUODENOSCOPY (EGD);  Surgeon: Lafayette Dragon, MD;  Location: [facility]COPY;  Service: Endoscopy;  Laterality: N/A;  . ESOPHAGOGASTRODUODENOSCOPY N/A 12/17/2012   Procedure: ESOPHAGOGASTRODUODENOSCOPY (EGD);  Surgeon: Jerene Bears, MD;  Location: [facility];  Service: Gastroenterology;  Laterality: N/A;  . EXCISION MASS HEAD N/A 03/05/2016   Procedure: EXCISION POSTERIOR SCALP MASS;  Surgeon: Ralene Ok, MD;  Location: [facility];  Service: General;  Laterality: N/A;  . EYE SURGERY    . HEMORRHOID SURGERY    . KNEE ARTHROSCOPY    . LAPAROSCOPIC NISSEN FUNDOPLICATION N/A 12/27/4266   Procedure: LAPAROSCOPIC NISSEN FUNDOPLICATION;  Surgeon: [name], MD;  Location: [facility];  Service: General;  Laterality: N/A;  . MENISCECTOMY   July 2002    preoperative diagnosis torn medial meniscus right knee, partial medial meniscectomy, debridement chondroplasty patellofemoral joint, done by Dr. French Ana  . POLYPECTOMY  2000   Dr.Magod  . ROTATOR CUFF REPAIR  09/21/2011   rt shoulder     Medications:  Outpatient Encounter Prescriptions as of 07/22/2016  Medication Sig  . acetaminophen (TYLENOL) 500 MG tablet Take 1,000-1,500 mg by mouth every 6 (six) hours as needed for mild pain.   Marland Kitchen albuterol (PROVENTIL HFA;VENTOLIN HFA) 108 (90 Base) MCG/ACT inhaler Inhale 2 puffs into the lungs every 4 (four) hours as needed for wheezing or shortness of breath.  Marland Kitchen amLODipine (NORVASC) 10 MG tablet TAKE 1 TABLET (10 MG TOTAL) BY MOUTH DAILY WITH BREAKFAST.  . carboxymethylcellulose (REFRESH TEARS) 0.5 % SOLN Place 1 drop into both eyes 3 (three) times daily as needed (for eye irritation).  . Cetirizine HCl 10 MG CAPS Take one po daily prn drainage.  . diclofenac sodium (VOLTAREN) 1 % GEL Apply 4 g topically 4 (four) times daily.   . diphenhydrAMINE (ALLERGY RELIEF) 25 mg capsule Take 25-50 mg by mouth every 6 (six) hours as needed for itching or sleep (for itching/scheduled at bedtime).   Marland Kitchen docusate sodium (COLACE) 100 MG capsule Take 100 mg by mouth 2 (two) times daily as needed for mild constipation.  Marland Kitchen escitalopram (LEXAPRO) 20 MG tablet Take 1 tablet (20 mg total) by mouth daily.  Marland Kitchen gabapentin (NEURONTIN) 600 MG tablet Take 0.5 tablets (300 mg total) by mouth 3 (three) times daily.  . Melatonin 1 MG TABS Take 2 tablets (2 mg total) by mouth at bedtime.  . pantoprazole (PROTONIX) 40 MG tablet Take 1 tablet (40 mg total) by mouth 2 (two) times daily.  . Vitamin D, Ergocalciferol, (DRISDOL) 50000 units CAPS capsule Take 1 capsule (50,000 Units total) by mouth every 7 (seven) days.  . [DISCONTINUED] propranolol (INDERAL) 20 MG tablet TAKE 1 TABLET BY MOUTH  TWICE A DAY  . nortriptyline (PAMELOR) 10 MG capsule Start nortriptyline 56m at bedtime for 2 week, then increase to 2 tablet at bedtime  . tiZANidine (ZANAFLEX) 2 MG tablet Take 1 tablet (2 mg total) by mouth at bedtime as needed for muscle spasms.   No facility-administered encounter medications on file as of 07/22/2016.      Allergies:  Allergies  Allergen Reactions  . Aspirin Rash    Family History: Family History  Problem Relation Age of Onset  . Heart disease Mother   . Hypertension Mother   . Heart attack Mother   . Hypertension Father   . Heart attack Father   . Diabetes Brother   . Stroke Neg Hx   . Cancer Neg Hx   . Colon cancer Neg Hx   . Anesthesia problems Neg Hx   . Hypotension Neg Hx   . Malignant hyperthermia Neg Hx   . Pseudochol deficiency Neg Hx     Social History: Social History  Substance Use Topics  . Smoking status: Former Smoker    Years: 50.00    Types: Cigarettes    Quit date: 06/01/2010  . Smokeless tobacco: Never Used  . 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     No children.  Education: some college.     Review of Systems:  CONSTITUTIONAL: No fevers, chills, night sweats, or weight loss.   EYES: No visual changes or eye pain ENT: No hearing changes.  No history of nose bleeds.   RESPIRATORY: No cough, wheezing and shortness of breath.   CARDIOVASCULAR: Negative for chest pain, and palpitations.   GI: Negative for abdominal discomfort, blood in stools or black stools.  No recent change in bowel habits.   GU:  No history of incontinence.   MUSCLOSKELETAL: +history of joint pain or swelling.  No myalgias.   SKIN: Negative for lesions, rash, and itching.   HEMATOLOGY/ONCOLOGY: Negative for prolonged bleeding, bruising easily, and swollen nodes.  No history of cancer.   ENDOCRINE: Negative for cold or heat intolerance, polydipsia or goiter.   PSYCH:  No depression or anxiety symptoms.   NEURO: As Above.   Vital Signs:  BP 118/80   Pulse 81   Ht 5' (1.524 m)   Wt 128 lb 1 oz (58.1 kg)   LMP 04/26/1950   SpO2 95%   BMI 25.01 kg/m    General Medical Exam:   General:  Well appearing, comfortable.   Eyes/ENT: see cranial nerve examination.   Neck: No masses appreciated.  Full range of motion without tenderness.  No carotid bruits. Respiratory:  Clear to auscultation, good air entry bilaterally.   Cardiac:  Regular rate and rhythm, no murmur.   Extremities:  No deformities, edema, or skin discoloration.  Skin:  No rashes or lesions.  Neurological Exam: MENTAL STATUS including orientation to time, place, person, recent and remote memory, attention span and concentration, language,  and fund of knowledge  is normal.  Speech is not dysarthric.  CRANIAL NERVES: II:  No visual field defects.  Unremarkable fundi.   III-IV-VI: Surgical pupils bilaterally.  Normal conjugate, extra-ocular eye movements in all directions of gaze.  No nystagmus.  No ptosis.   V:  Normal facial sensation.  Jaw jerk is absent.   VII:  Normal facial symmetry and movements.  No snout or palmomental.  Myerson's sign is present.  VIII:  Normal hearing and vestibular function.   IX-X:  Normal palatal movement.   XI:  Normal shoulder shrug and head rotation.   XII:  Normal tongue strength and range of motion, no deviation or fasciculation.  MOTOR:  No atrophy, fasciculations or abnormal movements.  No pronator drift.  Tone is normal.    Right Upper Extremity:    Left Upper Extremity:    Deltoid  5/5   Deltoid  5/5   Biceps  5/5   Biceps  5/5   Triceps  5/5   Triceps  5/5   Wrist extensors  5/5   Wrist extensors  5/5   Wrist flexors  5/5   Wrist flexors  5/5   Finger extensors  5/5   Finger extensors  5/5   Finger flexors  5/5   Finger flexors  5/5   Dorsal interossei  5/5   Dorsal interossei  5/5   Abductor pollicis  5/5   Abductor pollicis  5/5   Tone (Ashworth scale)  0  Tone (Ashworth scale)  0   Right Lower Extremity:    Left Lower Extremity:    Hip flexors  5/5   Hip flexors  5/5   Hip extensors  5/5   Hip extensors  5/5   Knee flexors  5/5   Knee flexors  5/5   Knee extensors  5/5   Knee extensors  5/5   Dorsiflexors  5/5   Dorsiflexors  5/5   Plantarflexors  5/5   Plantarflexors  5/5   Toe extensors  5/5   Toe extensors  5/5   Toe flexors  5/5   Toe flexors  5/5   Tone (Ashworth scale)  0  Tone (Ashworth scale)  0   MSRs:  Right                                                                 Left brachioradialis 2+  brachioradialis 2+  biceps 2+  biceps 2+  triceps 2+  triceps 2+  patellar 2+  patellar 2+  ankle jerk 1+  ankle jerk 1+  Hoffman no  Hoffman no  plantar response  down  plantar response up   SENSORY:  Normal and symmetric perception of light touch, pinprick, vibration, and proprioception.  Romberg's sign absent.   COORDINATION/GAIT: Normal finger-to- nose-finger.  Intact rapid alternating movements bilaterally.   Gait narrow based and stable. Very mild unsteadiness with tandem gait.  Stressed gait intact.    IMPRESSION: 1.  Chronic daily headaches, started in January and progressively worsening since her hospitalization.  There has been no relief with propranolol 12m twice daily and she would try like to try alternative medication.  I will start her on low dose of nortriptyline and flexeril 2.  Abnormal MRI brain with hyperintensity involving the corpus collosum.  Imaging personally reviewed with patient.  We discussed that she needs contrasted imaging to determine if this area of abnormality enhances.  Corpus callosal lesions can represent anything from demyelinating disease or malignancy, so need to follow this. We also discuss that she may need CSF testing, based on the results of her MRI which she is agreeable to.  Labs including ESR, HIV, RPR, vitamin B12, MMA, and ESR is normal 3.  Slurred speech and ataxia - resolved and not apparent on exam.  Gait appears stable, even tested with tandem.  PLAN/RECOMMENDATIONS:  1. Start nortriptyline 83m at bedtime for 2 week, then increase to 2 tablet at bedtime 2. Start tizanidine 282mat bedtime as needed for severe headache 3. Stop propranolol 4. MRI brain with contrast 5. Continue physical therapy.  Start using a cane for support  Return to clinic in 4 weeks  The duration of this appointment visit was 60 minutes of face-to-face time with the patient.  Greater than 50% of this time was spent in counseling, explanation of diagnosis, planning of further management, and coordination of care.   Thank you for allowing me to participate in patient's care.  If I can answer any additional questions, I would be  pleased to do so.    Sincerely,    Donika K. PaPosey ProntoDO

## 2016-07-22 NOTE — Patient Instructions (Addendum)
1. Start nortriptyline 10mg  at bedtime for 2 week, then increase to 2 tablet at bedtime 2. After 3 days, you can try tizanidine 2mg  at bedtime as needed for severe headache 3. Stop propranolol 4. MRI brain with contrast 5. Continue physical therapy.  Start using a cane for support  Return to clinic in 4 weeks

## 2016-07-23 ENCOUNTER — Ambulatory Visit (HOSPITAL_COMMUNITY): Admission: RE | Admit: 2016-07-23 | Payer: Medicare HMO | Source: Ambulatory Visit

## 2016-07-29 DIAGNOSIS — E785 Hyperlipidemia, unspecified: Secondary | ICD-10-CM | POA: Diagnosis not present

## 2016-07-29 DIAGNOSIS — F329 Major depressive disorder, single episode, unspecified: Secondary | ICD-10-CM | POA: Diagnosis not present

## 2016-07-29 DIAGNOSIS — F419 Anxiety disorder, unspecified: Secondary | ICD-10-CM | POA: Diagnosis not present

## 2016-07-29 DIAGNOSIS — I10 Essential (primary) hypertension: Secondary | ICD-10-CM | POA: Diagnosis not present

## 2016-07-29 DIAGNOSIS — D500 Iron deficiency anemia secondary to blood loss (chronic): Secondary | ICD-10-CM | POA: Diagnosis not present

## 2016-07-29 DIAGNOSIS — R270 Ataxia, unspecified: Secondary | ICD-10-CM | POA: Diagnosis not present

## 2016-07-29 DIAGNOSIS — K219 Gastro-esophageal reflux disease without esophagitis: Secondary | ICD-10-CM | POA: Diagnosis not present

## 2016-07-29 DIAGNOSIS — I872 Venous insufficiency (chronic) (peripheral): Secondary | ICD-10-CM | POA: Diagnosis not present

## 2016-08-04 ENCOUNTER — Telehealth: Payer: Self-pay | Admitting: *Deleted

## 2016-08-04 NOTE — Telephone Encounter (Signed)
MRI PA # [id number]

## 2016-08-06 ENCOUNTER — Ambulatory Visit
Admission: RE | Admit: 2016-08-06 | Discharge: 2016-08-06 | Disposition: A | Discharge status: Home / Self Care | Payer: Medicare HMO | Source: Ambulatory Visit | Attending: Neurology | Admitting: Neurology

## 2016-08-06 DIAGNOSIS — R9082 White matter disease, unspecified: Secondary | ICD-10-CM

## 2016-08-06 DIAGNOSIS — R51 Headache: Secondary | ICD-10-CM | POA: Diagnosis not present

## 2016-08-06 DIAGNOSIS — R519 Headache, unspecified: Secondary | ICD-10-CM

## 2016-08-06 MED ORDER — GADOBENATE DIMEGLUMINE 529 MG/ML IV SOLN
15.0000 mL | Freq: Once | INTRAVENOUS | Status: AC | PRN
  Administered 2016-08-06: 11 mL via INTRAVENOUS

## 2016-08-12 ENCOUNTER — Telehealth: Payer: Self-pay | Admitting: Neurology

## 2016-08-12 NOTE — Telephone Encounter (Signed)
I attempted to contact patient via phone today regarding the results of MRI brain, however she was not available at any of the numbers on file.  Message was left with her caregiver that I will try again on Monday.  He requests to call on her cell phone [phone].   K. Allena Katz, DO

## 2016-08-16 ENCOUNTER — Telehealth: Payer: Self-pay | Admitting: *Deleted

## 2016-08-16 ENCOUNTER — Telehealth: Payer: Self-pay | Admitting: Neurology

## 2016-08-16 ENCOUNTER — Other Ambulatory Visit: Payer: Self-pay | Admitting: *Deleted

## 2016-08-16 DIAGNOSIS — R9389 Abnormal findings on diagnostic imaging of other specified body structures: Secondary | ICD-10-CM

## 2016-08-16 NOTE — Telephone Encounter (Signed)
Order placed

## 2016-08-16 NOTE — Telephone Encounter (Signed)
Patient states that she is returning Dr Allena Katz call from 08-12-16 please call her before 10:00 if possible she said she has appt to go to. Patient can be reached at [phone]

## 2016-08-16 NOTE — Telephone Encounter (Signed)
Please inform patient that her MRI brain continues to show abnormalities which are stable and unchanged, but because we do not know the nature of these changes, I would like to look into further with doing a spinal tap, as we discussed in the office.  If she is agreeable, please order LP.  Donika K. Allena Katz, DO

## 2016-08-16 NOTE — Telephone Encounter (Signed)
Order placed.  I will have Roberta call [name] (caregiver) to schedule the appointment.  [phone]

## 2016-08-16 NOTE — Telephone Encounter (Signed)
-----   Message from Glendale Chard, DO sent at 08/16/2016 10:24 AM EDT ----- Please order CSF cell count and diff, protein, glucose, fungal cultures, cryptococcus antigen, IgG index, oligoclonal bands,  flow cytology, flow cytometry (cytospin), ACE, CSF JCV.

## 2016-08-18 ENCOUNTER — Other Ambulatory Visit: Payer: Self-pay | Admitting: *Deleted

## 2016-08-18 ENCOUNTER — Telehealth: Payer: Self-pay | Admitting: Neurology

## 2016-08-18 ENCOUNTER — Telehealth: Payer: Self-pay | Admitting: *Deleted

## 2016-08-18 ENCOUNTER — Ambulatory Visit
Admission: RE | Admit: 2016-08-18 | Discharge: 2016-08-18 | Disposition: A | Discharge status: Home / Self Care | Payer: Medicare HMO | Source: Ambulatory Visit | Attending: Neurology | Admitting: Neurology

## 2016-08-18 ENCOUNTER — Other Ambulatory Visit (HOSPITAL_COMMUNITY)
Admission: RE | Admit: 2016-08-18 | Discharge: 2016-08-18 | Disposition: A | Discharge status: Home / Self Care | Payer: Medicare HMO | Source: Ambulatory Visit | Attending: Neurology | Admitting: Neurology

## 2016-08-18 VITALS — BP 146/75 | HR 62

## 2016-08-18 DIAGNOSIS — R9389 Abnormal findings on diagnostic imaging of other specified body structures: Secondary | ICD-10-CM

## 2016-08-18 DIAGNOSIS — D649 Anemia, unspecified: Secondary | ICD-10-CM | POA: Diagnosis not present

## 2016-08-18 DIAGNOSIS — Z029 Encounter for administrative examinations, unspecified: Secondary | ICD-10-CM | POA: Insufficient documentation

## 2016-08-18 DIAGNOSIS — R51 Headache: Secondary | ICD-10-CM | POA: Diagnosis not present

## 2016-08-18 DIAGNOSIS — R9089 Other abnormal findings on diagnostic imaging of central nervous system: Secondary | ICD-10-CM

## 2016-08-18 DIAGNOSIS — R836 Abnormal cytological findings in cerebrospinal fluid: Secondary | ICD-10-CM | POA: Diagnosis not present

## 2016-08-18 DIAGNOSIS — D72819 Decreased white blood cell count, unspecified: Secondary | ICD-10-CM | POA: Diagnosis not present

## 2016-08-18 LAB — CSF CELL COUNT WITH DIFFERENTIAL
BASOPHILS, %: 0 %
Eosinophils, CSF: 0 %
LYMPHS CSF: 84 % — AB (ref 40–80)
Monocyte/Macrophage: 15 % (ref 15–45)
RBC COUNT CSF: 0 {cells}/uL (ref 0–10)
Segmented Neutrophils-CSF: 1 % (ref 0–6)
WBC, CSF: 7 cells/uL — ABNORMAL HIGH (ref 0–5)

## 2016-08-18 LAB — PROTEIN, CSF: Total Protein, CSF: 86 mg/dL — ABNORMAL HIGH (ref 15–60)

## 2016-08-18 LAB — GLUCOSE, CSF: Glucose, CSF: 51 mg/dL (ref 43–76)

## 2016-08-18 MED ORDER — TRAMADOL HCL 50 MG PO TABS: 50.0000 mg | ORAL_TABLET | Freq: Every evening | ORAL | 0 refills | Status: DC | PRN

## 2016-08-18 NOTE — Telephone Encounter (Signed)
Has she tried Tramadol? If not, pls send Tramadol  take 1 tablet at bedtime as needed for pain. Dispense only 10 tablets. If she is in significant pain, recommend ER. Thanks

## 2016-08-18 NOTE — Discharge Instructions (Signed)

## 2016-08-18 NOTE — Telephone Encounter (Signed)
Called patient back and reminded her to try and lay flat, to stay hydrated and she can try drinks with caffeine to help with the headache.

## 2016-08-18 NOTE — Telephone Encounter (Signed)
Patient called back in tears. Thanks

## 2016-08-18 NOTE — Telephone Encounter (Signed)
Patient notified that we will call in tramadol for her.

## 2016-08-18 NOTE — Telephone Encounter (Signed)
PT called and said she just had a spinal tap done and her grandmother passed away and she needed something for the pain/Sherri Mcmillan

## 2016-08-18 NOTE — Progress Notes (Signed)
Blood drawn from left AC to go with spinal fluid. Site is unremarkable and pt tolerated well. 

## 2016-08-18 NOTE — Telephone Encounter (Signed)
Please advise 

## 2016-08-19 ENCOUNTER — Ambulatory Visit: Payer: Medicare HMO | Admitting: Neurology

## 2016-08-21 LAB — ANGIOTENSIN CONVERTING ENZYME, CSF: ACE, CSF: 8 U/L (ref <=15)

## 2016-08-21 LAB — LEUKEMIA/LYMPHOMA EVALUATION PANEL
Number of Markers:: 14
VIABILITY: 53 %

## 2016-08-21 LAB — CRYPTOCOCCAL AG, LTX SCR RFLX TITER: Cryptococcal Ag Screen: NOT DETECTED

## 2016-08-21 LAB — CNS IGG SYNTHESIS RATE, CSF+BLOOD
ALBUMIN CSF: 48.7 mg/dL — AB (ref 8.0–42.0)
Albumin, Serum(Neph): 1.2 g/dL — ABNORMAL LOW (ref 3.2–4.6)
IGG INDEX, CSF: 0.41 (ref <0.66)
IGG, CSF: 6.1 mg/dL (ref 0.8–7.7)
IGG, SERUM: 369 mg/dL — AB (ref 694–1618)
MS CNS IGG SYNTHESIS RATE: -3.2 mg/(24.h) (ref <=3.3)

## 2016-08-22 LAB — CSF CULTURE: GRAM STAIN: NONE SEEN

## 2016-08-22 LAB — CSF CULTURE W GRAM STAIN
Gram Stain: 336
Organism ID, Bacteria: NO GROWTH

## 2016-08-24 LAB — OTHER SOLSTAS TEST

## 2016-08-25 LAB — OLIGOCLONAL BANDS, CSF + SERM

## 2016-09-04 ENCOUNTER — Other Ambulatory Visit: Payer: Self-pay | Admitting: Neurology

## 2016-09-06 ENCOUNTER — Other Ambulatory Visit: Payer: Self-pay | Admitting: *Deleted

## 2016-09-06 MED ORDER — TIZANIDINE HCL 2 MG PO TABS: 2.0000 mg | ORAL_TABLET | Freq: Every evening | ORAL | 3 refills | Status: DC | PRN

## 2016-09-06 NOTE — Telephone Encounter (Signed)
Rx sent 

## 2016-09-16 ENCOUNTER — Other Ambulatory Visit: Payer: Self-pay | Admitting: *Deleted

## 2016-09-16 LAB — FUNGUS CULTURE W SMEAR

## 2016-09-16 MED ORDER — PANTOPRAZOLE SODIUM 40 MG PO TBEC: 40.0000 mg | DELAYED_RELEASE_TABLET | Freq: Two times a day (BID) | ORAL | 0 refills | Status: DC

## 2016-09-21 NOTE — Addendum Note (Signed)
Addended by: Maura CrandallGOLDSTON, DARLENE C on: 09/21/2016 12:05 PM   Modules accepted: Orders

## 2016-09-22 MED ORDER — PANTOPRAZOLE SODIUM 40 MG PO TBEC: 40.0000 mg | DELAYED_RELEASE_TABLET | Freq: Two times a day (BID) | ORAL | 2 refills | Status: DC

## 2016-10-01 ENCOUNTER — Encounter: Payer: Self-pay | Admitting: *Deleted

## 2016-10-04 ENCOUNTER — Other Ambulatory Visit: Payer: Self-pay | Admitting: Internal Medicine

## 2016-10-04 NOTE — Telephone Encounter (Signed)
Received this request from pharmacy, med not on list but was mentioned on OV 05/14/16 (used for migraine prophylaxis). Cannot find note if this was d/cd, pls review! Thanks!

## 2016-10-30 ENCOUNTER — Encounter (HOSPITAL_COMMUNITY): Payer: Self-pay | Admitting: Emergency Medicine

## 2016-10-30 ENCOUNTER — Emergency Department (HOSPITAL_COMMUNITY)
Admission: EM | Admit: 2016-10-30 | Discharge: 2016-10-30 | Disposition: A | Discharge status: Home / Self Care | Payer: Medicare Other | Attending: Emergency Medicine | Admitting: Emergency Medicine

## 2016-10-30 ENCOUNTER — Emergency Department (HOSPITAL_COMMUNITY): Payer: Medicare Other

## 2016-10-30 DIAGNOSIS — Z87891 Personal history of nicotine dependence: Secondary | ICD-10-CM | POA: Diagnosis not present

## 2016-10-30 DIAGNOSIS — R0789 Other chest pain: Secondary | ICD-10-CM | POA: Diagnosis not present

## 2016-10-30 DIAGNOSIS — G8929 Other chronic pain: Secondary | ICD-10-CM

## 2016-10-30 DIAGNOSIS — I10 Essential (primary) hypertension: Secondary | ICD-10-CM | POA: Diagnosis not present

## 2016-10-30 DIAGNOSIS — Z79899 Other long term (current) drug therapy: Secondary | ICD-10-CM | POA: Diagnosis not present

## 2016-10-30 DIAGNOSIS — R51 Headache: Secondary | ICD-10-CM | POA: Diagnosis not present

## 2016-10-30 DIAGNOSIS — R079 Chest pain, unspecified: Secondary | ICD-10-CM

## 2016-10-30 DIAGNOSIS — D649 Anemia, unspecified: Secondary | ICD-10-CM | POA: Diagnosis not present

## 2016-10-30 DIAGNOSIS — R0602 Shortness of breath: Secondary | ICD-10-CM | POA: Insufficient documentation

## 2016-10-30 LAB — CBC
HCT: 29.9 % — ABNORMAL LOW (ref 36.0–46.0)
HEMOGLOBIN: 8.9 g/dL — AB (ref 12.0–15.0)
MCH: 23.5 pg — ABNORMAL LOW (ref 26.0–34.0)
MCHC: 29.8 g/dL — ABNORMAL LOW (ref 30.0–36.0)
MCV: 78.9 fL (ref 78.0–100.0)
PLATELETS: 159 10*3/uL (ref 150–400)
RBC: 3.79 MIL/uL — AB (ref 3.87–5.11)
RDW: 16.8 % — ABNORMAL HIGH (ref 11.5–15.5)
WBC: 3 10*3/uL — AB (ref 4.0–10.5)

## 2016-10-30 LAB — BASIC METABOLIC PANEL
ANION GAP: 6 (ref 5–15)
BUN: 13 mg/dL (ref 6–20)
CHLORIDE: 108 mmol/L (ref 101–111)
CO2: 26 mmol/L (ref 22–32)
CREATININE: 0.78 mg/dL (ref 0.44–1.00)
Calcium: 9 mg/dL (ref 8.9–10.3)
GFR calc non Af Amer: >60 mL/min (ref >60)
Glucose, Bld: 91 mg/dL (ref 65–99)
POTASSIUM: 3.5 mmol/L (ref 3.5–5.1)
SODIUM: 140 mmol/L (ref 135–145)

## 2016-10-30 LAB — I-STAT TROPONIN, ED: Troponin i, poc: 0.01 ng/mL (ref 0.00–0.08)

## 2016-10-30 MED ORDER — IPRATROPIUM-ALBUTEROL 0.5-2.5 (3) MG/3ML IN SOLN
3.0000 mL | Freq: Once | RESPIRATORY_TRACT | Status: AC
  Administered 2016-10-30: 3 mL via RESPIRATORY_TRACT
  Filled 2016-10-30: qty 3

## 2016-10-30 MED ORDER — PROMETHAZINE HCL 25 MG PO TABS: 12.5000 mg | ORAL_TABLET | Freq: Three times a day (TID) | ORAL | 0 refills | Status: DC | PRN

## 2016-10-30 MED ORDER — FENTANYL CITRATE (PF) 100 MCG/2ML IJ SOLN
50.0000 ug | Freq: Once | INTRAMUSCULAR | Status: AC
  Administered 2016-10-30: 50 ug via INTRAVENOUS
  Filled 2016-10-30: qty 2

## 2016-10-30 MED ORDER — PROMETHAZINE HCL 25 MG/ML IJ SOLN
6.2500 mg | Freq: Once | INTRAMUSCULAR | Status: AC
  Administered 2016-10-30: 6.25 mg via INTRAVENOUS
  Filled 2016-10-30: qty 1

## 2016-10-30 NOTE — Discharge Instructions (Signed)
Follow-up through primary care doctor and your neurologist for further management of chest pain and headaches.

## 2016-10-30 NOTE — ED Provider Notes (Signed)
[facility]-EMERGENCY DEPT Provider Note   CSN: [id number] Arrival date & time: 10/30/16  1133     History   Chief Complaint Chief Complaint  Patient presents with  . Chest Pain  . Shortness of Breath    HPI Sherri Mcmillan is a 83 y.o. female.  HPI Patient presents with shortness of breath chest pain and headache. Pain in her upper chest and burning. Has had a cough is been nonproductive. No fevers. Has had chest pains like that in the past. No hemoptysis. No fevers. Previous remote DVT. Also has headache. Chronic headaches. Throbbing left side. States goes down to her chest. Past Medical History:  Diagnosis Date  . Anemia 12/14/2012  . Aortic stenosis    Dx ECHO 2008 , mild and aymptomatic , needs ECHO q 3-5 yrs  . Barrett's esophagus    Demonstrated on EGD 12/2010. EGD 09/17/2012 shows inflamed GE junctional mucosa without metaplasia, dysplasia, or malignancy.  . Bronchitis    hx of  . Cataract   . Chronic anxiety    Admission to [facility] (90s or early 2000)  for 6 weeks after mother died. Complicated again by death of her sister 2010. 2012 developed hallucinations and I refused to refill controlled meds unless she see psych which she is not agreable to  . Chronic insomnia   . Chronic pain    DJD knees, back, migranes, LLE varicose veins, and LLE neuropathy.   . Depression   . DVT (deep venous thrombosis) (HCC)   . Elevated cholesterol 10/11   LDL 155. Her 10 year risk (decreasing her age to 73 as the calculator won't go to age 59) is 21% ish. If consider her non smoker (which I wouldn't even though she says 1 pak lasts 2 weeks) risk is 12% ish. So goal for LDL is 130 or 100.  Marland Kitchen Gastroparesis    Demonstrated on GES 12/2010 by Dr Dalene Seltzer  . GERD (gastroesophageal reflux disease)   . H/O hiatal hernia   . HTN (hypertension)    Controlled with 2 drug therapy  . Left leg DVT (HCC) 09/28/2012  . Migraine    sometimes    Patient Active Problem List   Diagnosis Date Noted  . Vitamin D  deficiency 06/30/2016  . Ataxia 06/24/2016  . Aortic atherosclerosis (HCC) 06/24/2016  . Viral gastroenteritis 06/24/2016  . Iron deficiency anemia due to chronic blood loss 06/24/2016  . Abnormal finding on MRI of brain 06/24/2016  . Acute pain of left knee 06/09/2016  . Chronic bronchitis (HCC) 08/14/2015  . Allergic rhinitis 07/03/2015  . Cough 03/01/2014  . Osteoporosis 02/19/2014  . Pulmonary nodule 02/10/2014  . Headache 12/06/2013  . S/P Nissen fundoplication (without gastrostomy tube) procedure 05/02/2013  . GERD (gastroesophageal reflux disease) 08/03/2010  . Hyperlipidemia   . Anxiety and depression   . HTN (hypertension)   . Aortic stenosis   . Epidermiod Cyst 03/12/2008  . Insomnia 07/27/2006  . Chronic venous insufficiency 07/06/2006    Past Surgical History:  Procedure Laterality Date  . ABDOMINAL HYSTERECTOMY    . APPENDECTOMY    . CATARACT EXTRACTION     left eye  . COLONOSCOPY  2000&2005   Dr.Magod  . DIRECT LARYNGOSCOPY   September 2008    preoperative diagnosis hoarseness with anterior right vocal cord lesion -  direct laryngoscopy and excisional biopsy of right anterior vocal cord lesion done by Dr. Ezzard Standing  . ENDOVENOUS ABLATION SAPHENOUS VEIN W/ LASER Left 05-09-2014   EVLA left  small saphenous vein by Gretta Began MD  . ESOPHAGOGASTRODUODENOSCOPY  11/2010  . ESOPHAGOGASTRODUODENOSCOPY N/A 09/17/2012   Procedure: ESOPHAGOGASTRODUODENOSCOPY (EGD);  Surgeon: Hart Carwin, MD;  Location: [facility] ENDOSCOPY;  Service: Endoscopy;  Laterality: N/A;  . ESOPHAGOGASTRODUODENOSCOPY N/A 12/17/2012   Procedure: ESOPHAGOGASTRODUODENOSCOPY (EGD);  Surgeon: Beverley Fiedler, MD;  Location: [facility] ENDOSCOPY;  Service: Gastroenterology;  Laterality: N/A;  . EXCISION MASS HEAD N/A 03/05/2016   Procedure: EXCISION POSTERIOR SCALP MASS;  Surgeon: Axel Filler, MD;  Location: [facility] OR;  Service: General;  Laterality: N/A;  . EYE SURGERY    . HEMORRHOID SURGERY    . KNEE ARTHROSCOPY    .  LAPAROSCOPIC NISSEN FUNDOPLICATION N/A 05/02/2013   Procedure: LAPAROSCOPIC NISSEN FUNDOPLICATION;  Surgeon: Valarie Merino, MD;  Location: [facility] ORS;  Service: General;  Laterality: N/A;  . MENISCECTOMY   July 2002    preoperative diagnosis torn medial meniscus right knee, partial medial meniscectomy, debridement chondroplasty patellofemoral joint, done by Dr. Madelon Lips  . POLYPECTOMY  2000   Dr.Magod  . ROTATOR CUFF REPAIR  09/21/2011   rt shoulder    OB History    No data available       Home Medications    Prior to Admission medications   Medication Sig Start Date End Date Taking? Authorizing Provider  amLODipine (NORVASC) 10 MG tablet TAKE 1 TABLET (10 MG TOTAL) BY MOUTH DAILY WITH BREAKFAST. 04/01/16  Yes Carolynn Comment, MD  diphenhydrAMINE (ALLERGY RELIEF) 25 mg capsule Take 50 mg by mouth daily.    Yes [provider]  docusate sodium (COLACE) 100 MG capsule Take 100 mg by mouth 2 (two) times daily as needed for mild constipation.   Yes [provider]  escitalopram (LEXAPRO) 20 MG tablet TAKE 1 TABLET (20 MG TOTAL) BY MOUTH DAILY. 07/22/16  Yes Carolynn Comment, MD  Esomeprazole Magnesium (NEXIUM PO) Take 1 capsule by mouth See admin instructions. Take 1 capsule by mouth every morning and 1 capsule in the evening as needed for acid reflux   Yes [provider]  gabapentin (NEURONTIN) 600 MG tablet Take 0.5 tablets (300 mg total) by mouth 3 (three) times daily. Patient taking differently: Take 600 mg by mouth at bedtime.  06/25/16  Yes Carolynn Comment, MD  pantoprazole (PROTONIX) 40 MG tablet Take 1 tablet (40 mg total) by mouth 2 (two) times daily. 09/22/16  Yes Carolynn Comment, MD  propranolol (INDERAL) 20 MG tablet TAKE 1 TABLET (20 MG TOTAL) BY MOUTH 2 (TWO) TIMES DAILY. 10/06/16 10/06/17 Yes Carolynn Comment, MD  tiZANidine (ZANAFLEX) 2 MG tablet Take 1 tablet (2 mg total) by mouth at bedtime as needed for muscle spasms. 09/06/16  Yes Patel, Donika K, DO  albuterol  (PROVENTIL HFA;VENTOLIN HFA) 108 (90 Base) MCG/ACT inhaler Inhale 2 puffs into the lungs every 4 (four) hours as needed for wheezing or shortness of breath. Patient not taking: Reported on 10/30/2016 05/10/16   Hayden Rasmussen, NP  Cetirizine HCl 10 MG CAPS Take one po daily prn drainage. Patient not taking: Reported on 10/30/2016 05/10/16   Hayden Rasmussen, NP  diclofenac sodium (VOLTAREN) 1 % GEL Apply 4 g topically 4 (four) times daily. Patient not taking: Reported on 10/30/2016 07/14/16   Hyacinth Meeker, MD  Melatonin 1 MG TABS Take 2 tablets (2 mg total) by mouth at bedtime. Patient not taking: Reported on 10/30/2016 07/14/16   Hyacinth Meeker, MD  nortriptyline (PAMELOR) 10 MG capsule Start nortriptyline 10mg  at bedtime for 2 week, then increase to  2 tablet at bedtime Patient not taking: Reported on 10/30/2016 07/22/16   Nita Sickle K, DO  promethazine (PHENERGAN) 25 MG tablet Take 0.5 tablets (12.5 mg total) by mouth every 8 (eight) hours as needed for nausea. 10/30/16   Benjiman Core, MD  traMADol (ULTRAM) 50 MG tablet TAKE 1 TABLET AT BEDTIME AS NEEDED Patient not taking: Reported on 10/30/2016 09/06/16   Nita Sickle K, DO  Vitamin D, Ergocalciferol, (DRISDOL) 50000 units CAPS capsule Take 1 capsule (50,000 Units total) by mouth every 7 (seven) days. Patient not taking: Reported on 10/30/2016 06/30/16   Hyacinth Meeker, MD    Family History Family History  Problem Relation Age of Onset  . Heart disease Mother   . Hypertension Mother   . Heart attack Mother   . Hypertension Father   . Heart attack Father   . Diabetes Brother   . Stroke Neg Hx   . Cancer Neg Hx   . Colon cancer Neg Hx   . Anesthesia problems Neg Hx   . Hypotension Neg Hx   . Malignant hyperthermia Neg Hx   . Pseudochol deficiency Neg Hx     Social History Social History  Substance Use Topics  . Smoking status: Former Smoker    Years: 50.00    Types: Cigarettes    Quit date: 06/01/2010  . Smokeless tobacco: Never Used  . Alcohol  use No     Comment: quit 24 years ago     Allergies   Aspirin   Review of Systems Review of Systems  Constitutional: Negative for appetite change.  HENT: Negative for congestion.   Respiratory: Positive for cough.   Cardiovascular: Positive for chest pain.  Gastrointestinal: Negative for abdominal pain.  Genitourinary: Negative for dysuria.  Musculoskeletal: Negative for back pain.  Skin: Negative for pallor.  Neurological: Positive for headaches. Negative for syncope.  Hematological: Negative for adenopathy.  Psychiatric/Behavioral: Negative for confusion.     Physical Exam Updated Vital Signs BP (!) 188/96   Pulse (!) 57   Temp 98.6 F (37 C) (Oral)   Resp 16   LMP 04/26/1950   SpO2 97%   Physical Exam  Constitutional: She appears well-developed.  Eyes: EOM are normal.  Neck: Neck supple.  Cardiovascular: Normal rate.   Pulmonary/Chest: Effort normal. She exhibits tenderness.  Mild tenderness to left upper anterior chest wall.  Abdominal: There is no tenderness.  Musculoskeletal: She exhibits no edema.  Neurological: She is alert.  Skin: Skin is warm.     ED Treatments / Results  Labs (all labs ordered are listed, but only abnormal results are displayed) Labs Reviewed  CBC - Abnormal; Notable for the following:       Result Value   WBC 3.0 (*)    RBC 3.79 (*)    Hemoglobin 8.9 (*)    HCT 29.9 (*)    MCH 23.5 (*)    MCHC 29.8 (*)    RDW 16.8 (*)    All other components within normal limits  BASIC METABOLIC PANEL  I-STAT TROPOININ, ED    EKG  EKG Interpretation  Date/Time:  Saturday October 30 2016 11:40:01 EDT Ventricular Rate:  57 PR Interval:  140 QRS Duration: 80 QT Interval:  426 QTC Calculation: 414 R Axis:   65 Text Interpretation:  Sinus bradycardia Otherwise normal ECG Confirmed by Rubin Payor  MD,  [phone]) on 10/30/2016 12:30:36 PM       Radiology Dg Chest 2 View  Result Date: 10/30/2016 CLINICAL DATA:  83 year old female  with a history of recurrent shortness of breath EXAM: CHEST  2 VIEW COMPARISON:  06/15/2016, 05/10/2016, CT 07/08/2015 FINDINGS: Cardiomediastinal silhouette unchanged in size and contour. Calcifications of the aortic arch. Double density in the lower mediastinum, unchanged from comparison studies compatible with known hernia. Unchanged configuration of the upper mediastinum. Calcifications of the aortic arch. No pneumothorax.  No pleural effusion. Coarsened interstitial markings. No confluent airspace disease. No interlobular septal thickening or central vascular congestion. Degenerative changes of the bilateral acromioclavicular joints. Degenerative changes of the spine. IMPRESSION: Chronic lung changes without evidence of superimposed acute cardiopulmonary disease. Hiatal hernia Electronically Signed   By: Gilmer MorJaime  Wagner D.O.   On: 10/30/2016 12:08    Procedures Procedures (including critical care time)  Medications Ordered in ED Medications  ipratropium-albuterol (DUONEB) 0.5-2.5 (3) MG/3ML nebulizer solution 3 mL (3 mLs Nebulization Given 10/30/16 1300)  promethazine (PHENERGAN) injection 6.25 mg (6.25 mg Intravenous Given 10/30/16 1332)  fentaNYL (SUBLIMAZE) injection 50 mcg (50 mcg Intravenous Given 10/30/16 1423)     Initial Impression / Assessment and Plan / ED Course  I have reviewed the triage vital signs and the nursing notes.  Pertinent labs & imaging results that were available during my care of the patient were reviewed by me and considered in my medical decision making (see chart for details).     Patient with headache and chest pain. History of both chronically. Initial blood pressure somewhat increased and his come down. EKG and lab work reassuring. Feels better after treatment. Will discharge home follow-up with her primary care doctor. Doubt cardiac cause or pulmonary embolism as cause.  Final Clinical Impressions(s) / ED Diagnoses   Final diagnoses:  Nonspecific chest pain    Chronic nonintractable headache, unspecified headache type    New Prescriptions Discharge Medication List as of 10/30/2016  3:52 PM    START taking these medications   Details  promethazine (PHENERGAN) 25 MG tablet Take 0.5 tablets (12.5 mg total) by mouth every 8 (eight) hours as needed for nausea., Starting Sat 10/30/2016, Print         Benjiman CorePickering, , MD 10/30/16 [id number]

## 2016-10-30 NOTE — ED Triage Notes (Signed)
Pt to ED c/o recurrent SOB accompanied by medial chest pain without radiation. Pt hx DVT, Barrett's esophagus, states she does have acid reflux at times, but this feels different. Pt reports pain is burning and is worse with taking deep breaths and coughing. She reports the cough has been there a while, non-productive. Resp e/u, skin warm/dry. Pt breathes loudly and has a raspy voice, but no apparent distress at this time.

## 2016-11-16 ENCOUNTER — Ambulatory Visit (INDEPENDENT_AMBULATORY_CARE_PROVIDER_SITE_OTHER): Payer: Medicare Other | Admitting: Internal Medicine

## 2016-11-16 ENCOUNTER — Encounter: Payer: Self-pay | Admitting: Internal Medicine

## 2016-11-16 VITALS — BP 186/71 | HR 57 | Temp 98.1°F | Ht 60.0 in | Wt 128.1 lb

## 2016-11-16 DIAGNOSIS — Z833 Family history of diabetes mellitus: Secondary | ICD-10-CM | POA: Diagnosis not present

## 2016-11-16 DIAGNOSIS — K2100 Gastro-esophageal reflux disease with esophagitis, without bleeding: Secondary | ICD-10-CM

## 2016-11-16 DIAGNOSIS — I10 Essential (primary) hypertension: Secondary | ICD-10-CM | POA: Diagnosis not present

## 2016-11-16 DIAGNOSIS — E785 Hyperlipidemia, unspecified: Secondary | ICD-10-CM

## 2016-11-16 DIAGNOSIS — Z79899 Other long term (current) drug therapy: Secondary | ICD-10-CM | POA: Diagnosis not present

## 2016-11-16 DIAGNOSIS — Z87891 Personal history of nicotine dependence: Secondary | ICD-10-CM

## 2016-11-16 DIAGNOSIS — Z8249 Family history of ischemic heart disease and other diseases of the circulatory system: Secondary | ICD-10-CM | POA: Diagnosis not present

## 2016-11-16 DIAGNOSIS — G44229 Chronic tension-type headache, not intractable: Secondary | ICD-10-CM

## 2016-11-16 MED ORDER — ALBUTEROL SULFATE HFA 108 (90 BASE) MCG/ACT IN AERS: 2.0000 | INHALATION_SPRAY | RESPIRATORY_TRACT | 0 refills | Status: DC | PRN

## 2016-11-16 MED ORDER — AMLODIPINE BESYLATE 10 MG PO TABS: 10.0000 mg | ORAL_TABLET | Freq: Every day | ORAL | 1 refills | Status: DC

## 2016-11-16 MED ORDER — GABAPENTIN 600 MG PO TABS: 600.0000 mg | ORAL_TABLET | Freq: Every day | ORAL | 1 refills | Status: DC

## 2016-11-16 NOTE — Assessment & Plan Note (Signed)
The patient's BP is elevated above goal of 140/90. Per chart review the patient's BP has been elevated on multiple previous visits. Her acute symptoms of headache and blurred vision are concerning for symptomatic, uncontrolled hypertension. This is the most likely diagnosis as she had a recent MRI in  07/2016 that did not show any masses. At this time I think her intermittent headaches are a result of uncontrolled HTN. The patient brought all medications to clinic with her today and she stopped taking amlodipine 10 mg daily at some point in the past few months. It is unclear how or why this medication was discontinued. She was restarted on amlodipine 10 mg, as it was well tolerated in the past and controlled her blood pressure when taken with propranolol 20 mg BID. She will need to have headaches and HTN reassessed at follow up visit with PCP in 6 weeks.  Plan: -Continue propranolol 20 mg BID -START amlodipine 10 mg daily -Follow up with PCP for BP recheck and reassessment of headache symptoms -

## 2016-11-16 NOTE — Progress Notes (Signed)
CC: Headache follow up  HPI:  [name] is a 83 y.o. with PMH of HLD, HTN, GERD, and depression who is presenting today for evaluation of chronic headaches. Per chart review the patient was seen in the ED on 10/30/2016 and evaluated for chest pain and headache. Her workup was negative for ACS (no EKG changes, negative troponin, chest X-Ray) and she was sent home with promethazine to use as needed for nausea associated with her headache. Since then the patient has felt a little better and has stayed active in her church and singing group. She does, however, continue to endorse chest pain and chronic headache.   Her chest pain is non-exertional and mainly occurs when she lays flat at night. She points to the area near her xiphoid process and up her sternum when asked where her pain is. It does not radiate to neck or arms. It does not occur with exertion and occurs mostly when she lays down at night. It is relieved when she gets up and walks around. This pain occurs almost every night and interferes with her sleep. She thinks it may be caused by her "acid reflux acting up again". She takes both Nexium and Protonix (1 tablet twice a day) and she report no relief from her pain with these medications.   Her headache is described as a constant headache which starts on both sides of her head and radiates to the front of her head. She endorses blurred vision with these headaches but does not think she sees spots in her field of vision. She denies photophobia, nausea, vomiting, and dizziness. She denies any recent falls, difficulty walking, difficulty with speech, and focal weakness/numbness of upper or lower extremities.  Past Medical History:  Diagnosis Date  . Anemia 12/14/2012  . Aortic stenosis    Dx ECHO 2008 , mild and aymptomatic , needs ECHO q 3-5 yrs  . Barrett's esophagus    Demonstrated on EGD 12/2010. EGD 09/17/2012 shows inflamed GE junctional mucosa without metaplasia, dysplasia, or  malignancy.  . Bronchitis    hx of  . Cataract   . Chronic anxiety    Admission to [facility] (90s or early 2000)  for 6 weeks after mother died. Complicated again by death of her sister 2010. 2012 developed hallucinations and I refused to refill controlled meds unless she see psych which she is not agreable to  . Chronic insomnia   . Chronic pain    DJD knees, back, migranes, LLE varicose veins, and LLE neuropathy.   . Depression   . DVT (deep venous thrombosis) (HCC)   . Elevated cholesterol 10/11   LDL 155. Her 10 year risk (decreasing her age to 75 as the calculator won't go to age 16) is 21% ish. If consider her non smoker (which I wouldn't even though she says 1 pak lasts 2 weeks) risk is 12% ish. So goal for LDL is 130 or 100.  Marland Kitchen Gastroparesis    Demonstrated on GES 12/2010 by Dr Dalene Seltzer  . GERD (gastroesophageal reflux disease)   . H/O hiatal hernia   . HTN (hypertension)    Controlled with 2 drug therapy  . Left leg DVT (HCC) 09/28/2012  . Migraine    sometimes   Review of Systems:  Patient endorses general malaise and fatigue with headaches Patient denies radiating and exertional chest pain, SOB, abdominal pain, change in bowel and bladder habits, and fevers/chills  Physical Exam:  Vitals:   11/16/16 0848  BP: Marland Kitchen)  186/71  Pulse: (!) 57  Temp: 98.1 F (36.7 C)  TempSrc: Oral  SpO2: 100%  Weight: 128 lb 1.6 oz (58.1 kg)  Height: 5' (1.524 m)   Physical Exam  Constitutional: She appears well-developed and well-nourished.  Elderly woman who appears younger than stated age sitting comfortably in chair in no acute distress.  Cardiovascular: Normal rate, regular rhythm, normal heart sounds and intact distal pulses.  Exam reveals no friction rub.   No murmur heard. Pulmonary/Chest: Effort normal and breath sounds normal. No respiratory distress. She has no wheezes.  Abdominal: Soft. She exhibits no distension. There is no tenderness. There is no guarding.  Neurological:  EOM  intact. Facial strength and sensation intact bilaterally. Symmetric smile. Tongue midline. UE and LE strength 5/5 bilaterally. Gross sensation intact on UE and LE bilaterally. Gait normal and patient walks with ease on her own without assistance.   Skin: Skin is warm and dry. Capillary refill takes less than 2 seconds. No rash noted. No erythema.    Assessment & Plan:   See Encounters Tab for problem based charting.  Patient seen with Dr. Rogelia BogaButcher.

## 2016-11-16 NOTE — Assessment & Plan Note (Signed)
Per chart review, this is a chronic problem for the patient and has likely gotten acutely worse as a result of uncontrolled HTN. The patient has been evaluated by neurology for ataxia and white matter changes on MRI - both of which have resolved and it seems that they have been helping manage her chronic headaches. The patient expressed continued frustration over inability to find a regimen that helps her headache. I do not think this is a chronic migraine, as the patient denies N/V, photophobia, and phonophobia. I do not think this is a cluster headache, as it is bilateral and patient does not endorse associated symptoms of watery/teary eyes or diaphoresis with headache. It is also unlikely to be temporal arteritis as it comes and goes and there was no tenderness to palpation over the temples on PE. It seems more likely that this is a chronic tension headache. If the headache doesn't resolve with treatment of her HTN, the patient may benefit from alternative therapies such as tricyclic antidepressants for headache prophylaxis.  Plan: -Reassess at follow up to see if symptoms resolve with better BP control -Continue supportive care for symptoms, including Tylenol

## 2016-11-16 NOTE — Patient Instructions (Addendum)
Thank you for seeing us today!  Please START taking amlodipine 10 mg tablet once a day for your blood pressure. Please let us know if you have leg swelling on this medication. Let us know at follow up in 4-6 weeks if your headaches continue on this medication.  Please take all medications as previously prescribed.  You were referred to [facility] for your acid reflux. Please follow up with them when an appointment is made.

## 2016-11-16 NOTE — Assessment & Plan Note (Addendum)
The patient was seen in the ED earlier this month for chest pain. A cardiac cause was ruled out at that time and she continues to endorse pain while laying flat at night which improves when she gets up and walks around. Given the description, localization, chronicity, and history of reflux it is likely that the patient is having an exacerbation of her reflux disease. The patient has essentially failed outpatient therapy, as she is on 2 different proton pump inhibitors and still reports symptoms. She has not seen GI in a few years, so she was given a referral to GI in clinic today to help determine what may be the cause of her symptoms. Per chart review the patient had an esophageal stricture on her last EGD that was dilated. It is reasonable that this may have recurred over time and could be the cause of her symptoms. The patient was told to continue her medications as previously prescribed and to follow up with GI.  Plan: -Continue pantoprazole 40 mg BID and esomeprazole 1 capsule BID -Referral to GI for re-evaluation and possible EGD

## 2016-11-17 NOTE — Progress Notes (Signed)
Internal Medicine Clinic Attending  I saw and evaluated the patient.  I personally confirmed the key portions of the history and exam documented by Dr. Nedrud and I reviewed pertinent patient test results.  The assessment, diagnosis, and plan were formulated together and I agree with the documentation in the resident's note.  

## 2016-11-22 ENCOUNTER — Encounter: Payer: Self-pay | Admitting: *Deleted

## 2016-11-25 ENCOUNTER — Encounter: Payer: Self-pay | Admitting: Gastroenterology

## 2016-12-03 ENCOUNTER — Other Ambulatory Visit: Payer: Self-pay | Admitting: *Deleted

## 2016-12-03 MED ORDER — PROPRANOLOL HCL 20 MG PO TABS: 20.0000 mg | ORAL_TABLET | Freq: Two times a day (BID) | ORAL | 0 refills | Status: DC

## 2016-12-07 ENCOUNTER — Other Ambulatory Visit: Payer: Self-pay | Admitting: *Deleted

## 2016-12-07 MED ORDER — ESCITALOPRAM OXALATE 20 MG PO TABS: 20.0000 mg | ORAL_TABLET | Freq: Every day | ORAL | 0 refills | Status: DC

## 2016-12-07 NOTE — Telephone Encounter (Signed)
Next appt 8/30 w/PCP.

## 2016-12-16 ENCOUNTER — Ambulatory Visit (INDEPENDENT_AMBULATORY_CARE_PROVIDER_SITE_OTHER): Payer: Medicare Other | Admitting: Internal Medicine

## 2016-12-16 VITALS — BP 119/92 | HR 78 | Temp 98.2°F | Ht 60.0 in | Wt 128.3 lb

## 2016-12-16 DIAGNOSIS — Z8249 Family history of ischemic heart disease and other diseases of the circulatory system: Secondary | ICD-10-CM | POA: Diagnosis not present

## 2016-12-16 DIAGNOSIS — Z79899 Other long term (current) drug therapy: Secondary | ICD-10-CM | POA: Diagnosis not present

## 2016-12-16 DIAGNOSIS — Z87891 Personal history of nicotine dependence: Secondary | ICD-10-CM | POA: Diagnosis not present

## 2016-12-16 DIAGNOSIS — I10 Essential (primary) hypertension: Secondary | ICD-10-CM | POA: Diagnosis not present

## 2016-12-16 DIAGNOSIS — L299 Pruritus, unspecified: Secondary | ICD-10-CM

## 2016-12-16 MED ORDER — HYDROXYZINE HCL 25 MG PO TABS: 25.0000 mg | ORAL_TABLET | Freq: Three times a day (TID) | ORAL | 0 refills | Status: DC | PRN

## 2016-12-16 NOTE — Patient Instructions (Addendum)
Thank you for visiting clinic today. We will do some blood workup to find the reason why you are itching. We will call you with any abnormal results. I'm giving you hydroxyzine for itching, it can make you drowsy please do not drive while taking it. Also be careful as it can cause a fall. If itching does not bother you much during the day just take it at nighttime. Please follow-up with your stomach doctor according to your scheduled appointment.

## 2016-12-16 NOTE — Progress Notes (Signed)
   CC: Generalized itching for 3 days, worse at night.  HPI:  [name] is a 83 y.o. lady with past medical history as listed below came to the clinic with complaint of generalized itching without any rash for the past 3 days, itching is worse at night and she was unable to sleep well for the past 3 nights. She has tried Benadryl cream with no relief. She denied any rash, bug bite or recent change in her personal products or laundry detergents. She was also complaining of bilateral foot edema for one week, she used to take Lasix 20 mg when necessary which was stopped a few months ago. She was recently restarted on amlodipine during previous office visit. She never had any trouble taking amlodipine before.  She was also complaining of decreased appetite and early satiety-she denies any recent weight loss. She has an history of esophageal stricture which was dilated. She has her upcoming appointment with GI on 12/29/2016.  Past Medical History:  Diagnosis Date  . Anemia 12/14/2012  . Aortic stenosis    Dx ECHO 2008 , mild and aymptomatic , needs ECHO q 3-5 yrs  . Barrett's esophagus    Demonstrated on EGD 12/2010. EGD 09/17/2012 shows inflamed GE junctional mucosa without metaplasia, dysplasia, or malignancy.  . Bronchitis    hx of  . Cataract   . Chronic anxiety    Admission to [facility] (90s or early 2000)  for 6 weeks after mother died. Complicated again by death of her sister 2010. 2012 developed hallucinations and I refused to refill controlled meds unless she see psych which she is not agreable to  . Chronic insomnia   . Chronic pain    DJD knees, back, migranes, LLE varicose veins, and LLE neuropathy.   . Depression   . DVT (deep venous thrombosis) (HCC)   . Elevated cholesterol 10/11   LDL 155. Her 10 year risk (decreasing her age to 65 as the calculator won't go to age 71) is 21% ish. If consider her non smoker (which I wouldn't even though she says 1 pak lasts 2 weeks) risk is  12% ish. So goal for LDL is 130 or 100.  Marland Kitchen Gastroparesis    Demonstrated on GES 12/2010 by Dr Dalene Seltzer  . GERD (gastroesophageal reflux disease)   . H/O hiatal hernia   . HTN (hypertension)    Controlled with 2 drug therapy  . Left leg DVT (HCC) 09/28/2012  . Migraine    sometimes   Review of Systems:  As per HPI.  Physical Exam:  Vitals:   12/16/16 1318  BP: (!) 119/92  Pulse: 78  Temp: 98.2 F (36.8 C)  TempSrc: Oral  SpO2: 100%  Weight: 128 lb 4.8 oz (58.2 kg)  Height: 5' (1.524 m)    General: Vital signs reviewed.  Patient is well-developed and well-nourished, in no acute distress and cooperative with exam.  Head: Normocephalic and atraumatic. Eyes: EOMI, conjunctivae normal, no scleral icterus. Mild periorbital edema.  Cardiovascular: RRR, S1 normal, S2 normal, no murmurs, gallops, or rubs.  Pulmonary/Chest: Clear to auscultation bilaterally, no wheezes, rales, or rhonchi. Abdominal: Soft, non-tender, non-distended, BS +, no masses, organomegaly, or guarding present.  Extremities: 1+ pedal edema bilaterally,  pulses symmetric and intact bilaterally. No cyanosis or clubbing. Skin: Warm, dry and intact.No rashes or erythema. no excoriation marks. Psychiatric: Appears upset and crying.  Assessment & Plan:   See Encounters Tab for problem based charting.  Patient discussed with Dr. Criselda Peaches.

## 2016-12-17 DIAGNOSIS — L299 Pruritus, unspecified: Secondary | ICD-10-CM | POA: Insufficient documentation

## 2016-12-17 LAB — CBC WITH DIFFERENTIAL/PLATELET
BASOS ABS: 0 10*3/uL (ref 0.0–0.2)
Basos: 0 %
EOS (ABSOLUTE): 0 10*3/uL (ref 0.0–0.4)
Eos: 1 %
Hematocrit: 29.7 % — ABNORMAL LOW (ref 34.0–46.6)
Hemoglobin: 8.8 g/dL — ABNORMAL LOW (ref 11.1–15.9)
IMMATURE GRANULOCYTES: 0 %
Immature Grans (Abs): 0 10*3/uL (ref 0.0–0.1)
LYMPHS ABS: 1.3 10*3/uL (ref 0.7–3.1)
Lymphs: 41 %
MCH: 23 pg — ABNORMAL LOW (ref 26.6–33.0)
MCHC: 29.6 g/dL — AB (ref 31.5–35.7)
MCV: 78 fL — ABNORMAL LOW (ref 79–97)
MONOS ABS: 0.4 10*3/uL (ref 0.1–0.9)
Monocytes: 13 %
NEUTROS PCT: 45 %
Neutrophils Absolute: 1.4 10*3/uL (ref 1.4–7.0)
PLATELETS: 214 10*3/uL (ref 150–379)
RBC: 3.82 x10E6/uL (ref 3.77–5.28)
RDW: 17.8 % — AB (ref 12.3–15.4)
WBC: 3.1 10*3/uL — AB (ref 3.4–10.8)

## 2016-12-17 LAB — CMP14 + ANION GAP
ALBUMIN: 4.4 g/dL (ref 3.5–4.7)
ALK PHOS: 79 IU/L (ref 39–117)
ALT: 9 IU/L (ref 0–32)
ANION GAP: 14 mmol/L (ref 10.0–18.0)
AST: 20 IU/L (ref 0–40)
Albumin/Globulin Ratio: 1.6 (ref 1.2–2.2)
BILIRUBIN TOTAL: 0.2 mg/dL (ref 0.0–1.2)
BUN / CREAT RATIO: 18 (ref 12–28)
BUN: 15 mg/dL (ref 8–27)
CHLORIDE: 101 mmol/L (ref 96–106)
CO2: 24 mmol/L (ref 20–29)
CREATININE: 0.83 mg/dL (ref 0.57–1.00)
Calcium: 9.2 mg/dL (ref 8.7–10.3)
GFR calc Af Amer: 76 mL/min/{1.73_m2} (ref >59)
GFR calc non Af Amer: 66 mL/min/{1.73_m2} (ref >59)
Globulin, Total: 2.7 g/dL (ref 1.5–4.5)
Glucose: 85 mg/dL (ref 65–99)
Potassium: 3.8 mmol/L (ref 3.5–5.2)
SODIUM: 139 mmol/L (ref 134–144)
Total Protein: 7.1 g/dL (ref 6.0–8.5)

## 2016-12-17 LAB — TSH: TSH: 1.41 u[IU]/mL (ref 0.450–4.500)

## 2016-12-17 MED ORDER — FERROUS SULFATE 325 (65 FE) MG PO TBEC: 325.0000 mg | DELAYED_RELEASE_TABLET | Freq: Two times a day (BID) | ORAL | 3 refills | Status: DC

## 2016-12-17 NOTE — Assessment & Plan Note (Signed)
BP Readings from Last 3 Encounters:  12/16/16 (!) 119/92  11/16/16 (!) 186/71  10/30/16 (!) 188/96   She was normotensive today. Apparently her previously elevated blood pressure was due to discontinuation of amlodipine. Patient was restarted on amlodipine during previous office visit, and she is compliant with that. Her bilateral foot edema might be due to amlodipine.  Continue with current management. She was instructed to keep her feet elevated while resting and use compression stockings when on feet.

## 2016-12-17 NOTE — Assessment & Plan Note (Signed)
She has generalized pruritus. It most commonly caused by a liver disease, thyroid problem ,iron deficiency anemia, psychogenic or it can be etiopathic in elderly. Her CMP was within normal limits, TSH was normal, CBC was consistent with her previous diagnosis of iron deficiency anemia. She is not on any iron supplement. I called the patient and left a message with a family member to call us back. I also sent a prescription for ferrous sulfate to her pharmacy. I also gave her a prescription of hydroxyzine to be used mostly at bedtime.

## 2016-12-21 NOTE — Progress Notes (Signed)
Internal Medicine Clinic Attending  Case discussed with Dr. Amin at the time of the visit.  We reviewed the resident's history and exam and pertinent patient test results.  I agree with the assessment, diagnosis, and plan of care documented in the resident's note.    

## 2016-12-23 ENCOUNTER — Encounter: Payer: Self-pay | Admitting: Internal Medicine

## 2016-12-23 ENCOUNTER — Ambulatory Visit (INDEPENDENT_AMBULATORY_CARE_PROVIDER_SITE_OTHER): Payer: Medicare Other | Admitting: Internal Medicine

## 2016-12-23 VITALS — BP 136/67 | HR 58 | Temp 97.8°F | Ht 60.0 in | Wt 123.5 lb

## 2016-12-23 DIAGNOSIS — K2100 Gastro-esophageal reflux disease with esophagitis, without bleeding: Secondary | ICD-10-CM

## 2016-12-23 DIAGNOSIS — R51 Headache: Secondary | ICD-10-CM | POA: Diagnosis not present

## 2016-12-23 DIAGNOSIS — Z87891 Personal history of nicotine dependence: Secondary | ICD-10-CM

## 2016-12-23 DIAGNOSIS — D500 Iron deficiency anemia secondary to blood loss (chronic): Secondary | ICD-10-CM

## 2016-12-23 DIAGNOSIS — Z8249 Family history of ischemic heart disease and other diseases of the circulatory system: Secondary | ICD-10-CM

## 2016-12-23 DIAGNOSIS — I10 Essential (primary) hypertension: Secondary | ICD-10-CM

## 2016-12-23 DIAGNOSIS — Z79899 Other long term (current) drug therapy: Secondary | ICD-10-CM

## 2016-12-23 DIAGNOSIS — G44229 Chronic tension-type headache, not intractable: Secondary | ICD-10-CM

## 2016-12-23 MED ORDER — NORTRIPTYLINE HCL 10 MG PO CAPS: 10.0000 mg | ORAL_CAPSULE | Freq: Every day | ORAL | 2 refills | Status: DC

## 2016-12-23 NOTE — Assessment & Plan Note (Signed)
Continues to have abdominal pain and throat pain that is consistent with her previously diagnosed GERD w/possible esophagitis. She was prescribed pantoprazole 40 mg BID and Esomeprazole 1 capsule BID. She continues to take her pantoprazole however, she discontinued her esomeprazole due to feeling it was not working. The pain keeps her away at night and most mornings she awakes with a cough a hoarse voice. She was evaluated on 7/24 and referred to GI. Her appointment is 9/6 with GI. She has made the necessary lifestyle changes, including not eating 2 hours prior to bed, avoiding spicy foods, not smoking, and not drinking alcohol.   Plan: - Continue Pantoprazole 40 mg BID  - Follow with GI

## 2016-12-23 NOTE — Patient Instructions (Addendum)
It was a pleasure to meet you.   - STOP taking the Propanolol  - START taking Nortriptyline 10 mg at bedtime  - START taking Tizanidine 2 mg at bed time  - Cut back your Benadryl to 25 mg a day. This is likely contributing to your dry mouth   - Please go back to see the Neurology clinic for your headaches. [facility], Dr. Noberto Retortonika K. Patel.  - Please go see your GI doctor next week  - Please come back to see me in 3 months

## 2016-12-23 NOTE — Assessment & Plan Note (Addendum)
Hgb 8.8 on 8/23, stable compared to prior. Was told to start PO iron however, she discontinued the medication due to side effects. Will continue to monitor.

## 2016-12-23 NOTE — Addendum Note (Signed)
Addended by: Earl LagosNARENDRA,  on: 12/23/2016 04:53 PM   Modules accepted: Level of Service

## 2016-12-23 NOTE — Progress Notes (Signed)
Internal Medicine Clinic Attending  I saw and evaluated the patient.  I personally confirmed the key portions of the history and exam documented by Dr. Helberg and I reviewed pertinent patient test results.  The assessment, diagnosis, and plan were formulated together and I agree with the documentation in the resident's note. 

## 2016-12-23 NOTE — Progress Notes (Signed)
   CC: Chronic headaches and GERD  HPI:  [name] is a 83 y.o. female with a PMHx significant for HTN, GERD s/p Nissen fundoplication, and iron deficiency anemia presenting for continued evaluation and management of her chronic medical issues. Please refer to problem based charting for history, evaluation, and management.   Past Medical History:  Diagnosis Date  . Anemia 12/14/2012  . Aortic stenosis    Dx ECHO 2008 , mild and aymptomatic , needs ECHO q 3-5 yrs  . Barrett's esophagus    Demonstrated on EGD 12/2010. EGD 09/17/2012 shows inflamed GE junctional mucosa without metaplasia, dysplasia, or malignancy.  . Bronchitis    hx of  . Cataract   . Chronic anxiety    Admission to [facility] (90s or early 2000)  for 6 weeks after mother died. Complicated again by death of her sister 2010. 2012 developed hallucinations and I refused to refill controlled meds unless she see psych which she is not agreable to  . Chronic insomnia   . Chronic pain    DJD knees, back, migranes, LLE varicose veins, and LLE neuropathy.   . Depression   . DVT (deep venous thrombosis) (HCC)   . Elevated cholesterol 10/11   LDL 155. Her 10 year risk (decreasing her age to [age] as the calculator won't go to age 83) is 21% ish. If consider her non smoker (which I wouldn't even though she says 1 pak lasts 2 weeks) risk is 12% ish. So goal for LDL is 130 or 100.  Marland Kitchen. Gastroparesis    Demonstrated on GES 12/2010 by Dr Dalene SeltzerKapland  . GERD (gastroesophageal reflux disease)   . H/O hiatal hernia   . HTN (hypertension)    Controlled with 2 drug therapy  . Left leg DVT (HCC) 09/28/2012  . Migraine    sometimes   Review of Systems  Constitutional: Negative.   HENT: Negative.   Eyes: Negative.   Respiratory: Negative.   Cardiovascular: Negative.   Gastrointestinal: Positive for abdominal pain and heartburn. Negative for nausea and vomiting.  Genitourinary: Negative.   Musculoskeletal: Negative.   Neurological: Positive  for headaches.   Physical Exam: Vitals:   12/23/16 1315  BP: 136/67  Pulse: (!) 58  Temp: 97.8 F (36.6 C)  TempSrc: Oral  SpO2: 100%  Weight: 123 lb 8 oz (56 kg)  Height: 5' (1.524 m)   Physical Exam  Constitutional: She is oriented to person, place, and time. She appears well-developed and well-nourished.  HENT:  Head: Normocephalic and atraumatic.  Eyes: Pupils are equal, round, and reactive to light. Conjunctivae are normal.  Cardiovascular: Normal rate, regular rhythm and intact distal pulses.   Pulmonary/Chest: Effort normal and breath sounds normal. She has no wheezes. She has no rales.  Abdominal: Soft. Bowel sounds are normal. She exhibits no distension. There is no tenderness.  Musculoskeletal: Normal range of motion. She exhibits no edema.  Neurological: She is alert and oriented to person, place, and time.   Assessment & Plan:   See Encounters Tab for problem based charting.  Patient seen with Dr. Heide SparkNarendra

## 2016-12-23 NOTE — Assessment & Plan Note (Addendum)
Continues to struggle with headaches. She was previously evaluated by neurology who recommended stopping the propanolol and starting nortriptyline 10 mg QHS for 1 week before increasing to 20 mg QHS and Tizanidine 2 mg QHS. She has not started these medications. NSAIDs are not an option at this point due to her GERD. Tylenol does not help.   Plan: - Follow-up up with neurology  - Start Nortriptyline 10 mg QHS - Start Tizanidine 2 mg QHS - Stop Propanolol

## 2016-12-23 NOTE — Assessment & Plan Note (Addendum)
Previously uncontrolled. Started on Amlodipine 10 mg QD. She has been taking it with no apparent side effects including LE swelling and constipation. Continues to have headaches. Does not check her BP at home.   Plan: - Continue Amlodipine 10 mg QD

## 2016-12-29 ENCOUNTER — Ambulatory Visit (INDEPENDENT_AMBULATORY_CARE_PROVIDER_SITE_OTHER): Payer: Medicare Other | Admitting: Gastroenterology

## 2016-12-29 ENCOUNTER — Encounter: Payer: Self-pay | Admitting: Gastroenterology

## 2016-12-29 VITALS — BP 126/62 | HR 74 | Ht 60.0 in | Wt 121.0 lb

## 2016-12-29 DIAGNOSIS — D509 Iron deficiency anemia, unspecified: Secondary | ICD-10-CM

## 2016-12-29 DIAGNOSIS — Z9889 Other specified postprocedural states: Secondary | ICD-10-CM

## 2016-12-29 DIAGNOSIS — K2100 Gastro-esophageal reflux disease with esophagitis, without bleeding: Secondary | ICD-10-CM

## 2016-12-29 DIAGNOSIS — R1319 Other dysphagia: Secondary | ICD-10-CM

## 2016-12-29 DIAGNOSIS — I350 Nonrheumatic aortic (valve) stenosis: Secondary | ICD-10-CM | POA: Diagnosis not present

## 2016-12-29 DIAGNOSIS — R1310 Dysphagia, unspecified: Secondary | ICD-10-CM

## 2016-12-29 NOTE — Progress Notes (Addendum)
[facility] Consult Note:  History: Sherri Mcmillan 12/29/2016  Referring physician: Levora Dredge, MD  Reason for consult/chief complaint: Gastroesophageal Reflux (has burning sensation in throat); Chest Pain; and Emesis (during the night)   Subjective  HPI:  This is an 83 year old woman referred back to [place] by internal medicine clinic for persistent reflux symptoms. She is a somewhat difficult and tangential historian. I also reviewed her previous GI Notes some primary care notes and labs/imaging reports. She was last seen here in October 2014 by our PA, and this was after an upper endoscopy revealing a large hiatal hernia with reflux esophagitis.  There was apparently some concern for gastroparesis based on an endoscopy earlier that year showing retained food in the stomach. She continues study in May 2014 was normal, but it is unclear whether or not the patient was on prokinetic agents at the time while hospitalized. After the last visit here, she was referred to general surgery, and underwent fundoplication with repair of the diaphragmatic defect in January 2015. She seems to have little recollection of that. However, when it is brought to her attention, she believes her reflux symptoms improved afterwards, only to return sometime last year. Bothered by frequent pyrosis and chest pain that radiates up into the neck, regurgitation, hoarseness and feeling of choking. She is a nonsmoker, is at a healthy weight, and says she is good about her diet and elevates her head of bed. There are nocturnal symptoms as well.  ROS:  Review of Systems  Constitutional: Positive for fatigue. Negative for appetite change and unexpected weight change.  HENT: Negative for mouth sores and voice change.   Eyes: Negative for pain and redness.  Respiratory: Negative for cough and shortness of breath.   Cardiovascular: Negative for chest pain and palpitations.  Genitourinary: Negative for  dysuria and hematuria.  Musculoskeletal: Positive for arthralgias and back pain. Negative for myalgias.  Skin: Negative for pallor and rash.  Neurological: Negative for weakness and headaches.  Hematological: Negative for adenopathy.   She has a chronic pain syndrome on multiple meds for this  Past Medical History: Past Medical History:  Diagnosis Date  . Anemia 12/14/2012  . Aortic stenosis    Dx ECHO 2008 , mild and aymptomatic , needs ECHO q 3-5 yrs  . Barrett's esophagus    Demonstrated on EGD 12/2010. EGD 09/17/2012 shows inflamed GE junctional mucosa without metaplasia, dysplasia, or malignancy.  . Bronchitis    hx of  . Cataract   . Chronic anxiety    Admission to [facility] (90s or early 2000)  for 6 weeks after mother died. Complicated again by death of her sister 2010. 2012 developed hallucinations and I refused to refill controlled meds unless she see psych which she is not agreable to  . Chronic insomnia   . Chronic pain    DJD knees, back, migranes, LLE varicose veins, and LLE neuropathy.   . Depression   . DVT (deep venous thrombosis) (HCC)   . Elevated cholesterol 10/11   LDL 155. Her 10 year risk (decreasing her age to 22 as the calculator won't go to age 21) is 21% ish. If consider her non smoker (which I wouldn't even though she says 1 pak lasts 2 weeks) risk is 12% ish. So goal for LDL is 130 or 100.  Marland Kitchen Gastroparesis    Demonstrated on GES 12/2010 by Dr Dalene Seltzer  . GERD (gastroesophageal reflux disease)   . H/O hiatal hernia   . HTN (  hypertension)    Controlled with 2 drug therapy  . Left leg DVT (HCC) 09/28/2012  . Migraine    sometimes     Past Surgical History: Past Surgical History:  Procedure Laterality Date  . ABDOMINAL HYSTERECTOMY    . APPENDECTOMY    . CATARACT EXTRACTION     left eye  . COLONOSCOPY  2000&2005   Dr.Magod  . DIRECT LARYNGOSCOPY   September 2008    preoperative diagnosis hoarseness with anterior right vocal cord lesion -  direct  laryngoscopy and excisional biopsy of right anterior vocal cord lesion done by Dr. Ezzard Standing  . ENDOVENOUS ABLATION SAPHENOUS VEIN W/ LASER Left 05-09-2014   EVLA left small saphenous vein by Gretta Began MD  . ESOPHAGOGASTRODUODENOSCOPY  11/2010  . ESOPHAGOGASTRODUODENOSCOPY N/A 09/17/2012   Procedure: ESOPHAGOGASTRODUODENOSCOPY (EGD);  Surgeon: Hart Carwin, MD;  Location[facility] ENDOSCOPY;  Service: Endoscopy;  Laterality: N/A;  . ESOPHAGOGASTRODUODENOSCOPY N/A 12/17/2012   Procedure: ESOPHAGOGASTRODUODENOSCOPY (EGD);  Surgeon: Beverley Fiedler, MD;  Location: [facility] ENDOSCOPY;  Service: Gastroenterology;  Laterality: N/A;  . EXCISION MASS HEAD N/A 03/05/2016   Procedure: EXCISION POSTERIOR SCALP MASS;  Surgeon: Axel Filler, MD;  Location: [facility] OR;  Service: General;  Laterality: N/A;  . EYE SURGERY    . HEMORRHOID SURGERY    . KNEE ARTHROSCOPY    . LAPAROSCOPIC NISSEN FUNDOPLICATION N/A 05/02/2013   Procedure: LAPAROSCOPIC NISSEN FUNDOPLICATION;  Surgeon: Valarie Merino, MD;  Location: [facility] ORS;  Service: General;  Laterality: N/A;  . MENISCECTOMY   July 2002    preoperative diagnosis torn medial meniscus right knee, partial medial meniscectomy, debridement chondroplasty patellofemoral joint, done by Dr. Madelon Lips  . POLYPECTOMY  2000   Dr.Magod  . ROTATOR CUFF REPAIR  09/21/2011   rt shoulder     Family History: Family History  Problem Relation Age of Onset  . Heart disease Mother   . Hypertension Mother   . Heart attack Mother   . Hypertension Father   . Heart attack Father   . Diabetes Brother   . Stroke Neg Hx   . Cancer Neg Hx   . Colon cancer Neg Hx   . Anesthesia problems Neg Hx   . Hypotension Neg Hx   . Malignant hyperthermia Neg Hx   . Pseudochol deficiency Neg Hx     Social History: Social History   Social History  . Marital status: Single    Spouse name: N/A  . Number of children: 0  . Years of education: some college   Occupational History  . disabled    Social  History Main Topics  . Smoking status: Former Smoker    Years: 50.00    Types: Cigarettes    Quit date: 06/01/2010  . Smokeless tobacco: Never Used  . Alcohol use No     Comment: quit 24 years ago  . Drug use: No  . Sexual activity: Not Asked   Other Topics Concern  . 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     No children.  Education:  some college.     Allergies: Allergies  Allergen Reactions  . Aspirin Rash    Outpatient Meds: Current Outpatient Prescriptions  Medication Sig Dispense Refill  . albuterol (PROVENTIL HFA;VENTOLIN HFA) 108 (90 Base) MCG/ACT inhaler Inhale 2 puffs into the lungs every 4 (four) hours as needed for wheezing or shortness of breath. 1 Inhaler 0  . amLODipine (NORVASC) 10 MG tablet Take 1 tablet (10 mg total) by mouth daily. 90 tablet 1  . escitalopram (LEXAPRO) 20 MG tablet Take 1 tablet (20 mg total) by mouth daily. 30 tablet 0  . ferrous sulfate 325 (65 FE) MG tablet Take 325 mg by mouth 2 (two) times daily with a meal.    . gabapentin (NEURONTIN) 600 MG tablet Take 1 tablet (600 mg total) by mouth at bedtime. 30 tablet 1  . hydrOXYzine (ATARAX/VISTARIL) 25 MG tablet Take 1 tablet (25 mg total) by mouth 3 (three) times daily as needed. 30 tablet 0  . nortriptyline (PAMELOR) 10 MG capsule Take 1 capsule (10 mg total) by mouth at bedtime. 30 capsule 2  . pantoprazole (PROTONIX) 40 MG tablet Take 1 tablet (40 mg total) by mouth 2 (two) times daily. 180 tablet 2  . tiZANidine (ZANAFLEX) 2 MG tablet Take 1 tablet (2 mg total) by mouth at bedtime as needed for muscle spasms. 20 tablet 3   No current  facility-administered medications for this visit.       ___________________________________________________________________ Objective   Exam:  BP 126/62   Pulse 74   Ht 5' (1.524 m)   Wt 121 lb (54.9 kg)   LMP 04/[birth year]/1950   BMI 23.63 kg/m    General: this is a(n) Elderly woman with normal vocal quality and no muscle wasting   Eyes: sclera anicteric, no redness  ENT: oral mucosa moist without lesions, no cervical or supraclavicular lymphadenopathy, good dentition  CV: RRR without murmur, S1/S2, no JVD, no peripheral edema  Resp: clear to auscultation bilaterally, normal RR and effort noted  GI: soft, no tenderness, with active bowel sounds. No guarding or palpable organomegaly noted.  Skin; warm and dry, no rash or jaundice noted  Neuro: awake, alert and oriented x 3. Normal gross motor function and fluent speech  Labs:  CBC Latest Ref Rng & Units 12/16/2016 10/30/2016 06/24/2016  WBC 3.4 - 10.8 x10E3/uL 3.1(L) 3.0(L) 3.8(L)  Hemoglobin 11.1 - 15.9 g/dL 1.6(X) 8.9(L) 8.9(L)  Hematocrit 34.0 - 46.6 % 29.7(L) 29.9(L) 29.1(L)  Platelets 150 - 379 x10E3/uL 214 159 159  low MCV. She stopped iron tablets due to digestive upset. Hgb 10.2 in Jan 18  Ferritin 11 in 2012-10-01, 7 in march 18  Radiologic Studies:  nml GES October 01, 2012 (low in 2010-10-02)  Assessment: Encounter Diagnoses  Name Primary?  . Gastroesophageal reflux disease with esophagitis Yes  . Esophageal dysphagia   . Iron deficiency anemia, unspecified iron deficiency anemia type   . Aortic valve stenosis, etiology of cardiac valve disease unspecified   . S/P Nissen fundoplication (without gastrostomy tube) procedure     She has recurrence of GERD symptoms after previous fundoplication. We discussed the limitations of antacid therapy. I have to wonder if the fundoplication is still intact, and it is not clear if she ever truly had gastroparesis. Her iron deficiency is of unclear cause but is not definitely on the basis of  chronic GI blood loss. She is unable to tolerate oral iron therapy.  Plan:  EGD with possible dilation  The benefits and risks of the  planned procedure were described in detail with the patient or (when appropriate) their health care proxy.  Risks were outlined as including, but not limited to, bleeding, infection, perforation, adverse medication reaction leading to cardiac or pulmonary decompensation, or pancreatitis (if ERCP).  The limitation of incomplete mucosal visualization was also discussed.  No guarantees or warranties were given.  Depending upon those findings, gastric emptying study may need to follow  Primary care should consider arranging IV iron treatments.  Thank you for the courtesy of this consult.  Please call me with any questions or concerns.  Charlie PitterHenry L Danis III  CC: Levora DredgeHelberg, Justin, MD

## 2016-12-29 NOTE — Patient Instructions (Signed)
If you are age 81 or older, your body mass index should be between 23-30. Your Body mass index is 23.63 kg/m. If this is out of the aforementioned range listed, please consider follow up with your Primary Care Provider.  If you are age 81 or younger, your body mass index should be between 19-25. Your Body mass index is 23.63 kg/m. If this is out of the aformentioned range listed, please consider follow up with your Primary Care Provider.   You have been scheduled for an endoscopy. Please follow written instructions given to you at your visit today. If you use inhalers (even only as needed), please bring them with you on the day of your procedure. Your physician has requested that you go to www.startemmi.com and enter the access code given to you at your visit today. This web site gives a general overview about your procedure. However, you should still follow specific instructions given to you by our office regarding your preparation for the procedure.  Thank you for choosing [facility]  Dr Amada JupiterHenry Danis III

## 2016-12-30 ENCOUNTER — Encounter: Payer: Self-pay | Admitting: Gastroenterology

## 2016-12-30 ENCOUNTER — Telehealth: Payer: Self-pay | Admitting: Gastroenterology

## 2016-12-30 ENCOUNTER — Ambulatory Visit (AMBULATORY_SURGERY_CENTER): Payer: Medicare Other | Admitting: Gastroenterology

## 2016-12-30 VITALS — BP 168/94 | HR 61 | Temp 98.4°F | Resp 18 | Ht 60.0 in | Wt 121.0 lb

## 2016-12-30 DIAGNOSIS — K2100 Gastro-esophageal reflux disease with esophagitis, without bleeding: Secondary | ICD-10-CM

## 2016-12-30 DIAGNOSIS — K449 Diaphragmatic hernia without obstruction or gangrene: Secondary | ICD-10-CM

## 2016-12-30 DIAGNOSIS — K2270 Barrett's esophagus without dysplasia: Secondary | ICD-10-CM | POA: Diagnosis not present

## 2016-12-30 DIAGNOSIS — R1310 Dysphagia, unspecified: Secondary | ICD-10-CM | POA: Diagnosis not present

## 2016-12-30 MED ORDER — SUCRALFATE 1 GM/10ML PO SUSP: 1.0000 g | Freq: Two times a day (BID) | ORAL | 1 refills | Status: DC

## 2016-12-30 MED ORDER — SODIUM CHLORIDE 0.9 % IV SOLN: 500.0000 mL | INTRAVENOUS | Status: DC

## 2016-12-30 NOTE — Patient Instructions (Addendum)
YOU HAD AN ENDOSCOPIC PROCEDURE TODAY AT THE  ENDOSCOPY CENTER:   Refer to the procedure report that was given to you for any specific questions about what was found during the examination.  If the procedure report does not answer your questions, please call your gastroenterologist to clarify.  If you requested that your care partner not be given the details of your procedure findings, then the procedure report has been included in a sealed envelope for you to review at your convenience later.  YOU SHOULD EXPECT: Some feelings of bloating in the abdomen. Passage of more gas than usual.  Walking can help get rid of the air that was put into your GI tract during the procedure and reduce the bloating.   Please Note:  You might notice some irritation and congestion in your nose or some drainage.  This is from the oxygen used during your procedure.  There is no need for concern and it should clear up in a day or so.  SYMPTOMS TO REPORT IMMEDIATELY:    Following upper endoscopy (EGD)  Vomiting of blood or coffee ground material  New chest pain or pain under the shoulder blades  Painful or persistently difficult swallowing  New shortness of breath  Fever of 100F or higher  Black, tarry-looking stools  For urgent or emergent issues, a gastroenterologist can be reached at any hour by calling [phone].   DIET:  We do recommend a small meal at first, but then you may proceed to your regular diet.  Drink plenty of fluids but you should avoid alcoholic beverages for 24 hours.  ACTIVITY:  You should plan to take it easy for the rest of today and you should NOT DRIVE or use heavy machinery until tomorrow (because of the sedation medicines used during the test).    FOLLOW UP: Our staff will call the number listed on your records the next business day following your procedure to check on you and address any questions or concerns that you may have regarding the information given to you  following your procedure. If we do not reach you, we will leave a message.  However, if you are feeling well and you are not experiencing any problems, there is no need to return our call.  We will assume that you have returned to your regular daily activities without incident.  If any biopsies were taken you will be contacted by phone or by letter within the next 1-3 weeks.  Please call us at [phone] if you have not heard about the biopsies in 3 weeks.    SIGNATURES/CONFIDENTIALITY: You and/or your care partner have signed paperwork which will be entered into your electronic medical record.  These signatures attest to the fact that that the information above on your After Visit Summary has been reviewed and is understood.  Full responsibility of the confidentiality of this discharge information lies with you and/or your care-partner.  Read all of the papers given to you by your recovery room nurse.   Take your sucralfate twice daily about 30 minutes before you eat. You need to take it for 2 months per Dr. Myrtie Neitheranis.

## 2016-12-30 NOTE — Progress Notes (Signed)
Report given to PACU, vss 

## 2016-12-30 NOTE — Progress Notes (Signed)
Called to room to assist during endoscopic procedure.  Patient ID and intended procedure confirmed with present staff. Received instructions for my participation in the procedure from the performing physician.  

## 2016-12-30 NOTE — Telephone Encounter (Signed)
Patient was reading about the possible side effects of Carafate and was concerned. She has never taken this before. Reassured patient that most people take it with no problems, and the drug companies have to list all possible side effects. Instructed her to try it and if she does have a problem to stop it and contact our office. Re-educated her on what the medication is for and the help it provides.

## 2016-12-30 NOTE — Op Note (Signed)
[facility] Patient Name: Sherri Mcmillan Procedure Date: 12/30/2016 11:27 AM MRN: [id number] Endoscopist: Sherilyn Cooter L. Myrtie Neither , MD Age: 83 Referring MD:  Date of Birth: 1932/09/13 Gender: Female Account #: [id number] Procedure:                Upper GI endoscopy Indications:              Dysphagia, Heartburn Medicines:                Monitored Anesthesia Care Procedure:                Pre-Anesthesia Assessment:                           - Prior to the procedure, a History and Physical                            was performed, and patient medications and                            allergies were reviewed. The patient's tolerance of                            previous anesthesia was also reviewed. The risks                            and benefits of the procedure and the sedation                            options and risks were discussed with the patient.                            All questions were answered, and informed consent                            was obtained. Prior Anticoagulants: The patient has                            taken no previous anticoagulant or antiplatelet                            agents. ASA Grade Assessment: III - A patient with                            severe systemic disease. After reviewing the risks                            and benefits, the patient was deemed in                            satisfactory condition to undergo the procedure.                           After obtaining informed consent, the endoscope was  passed under direct vision. Throughout the                            procedure, the patient's blood pressure, pulse, and                            oxygen saturations were monitored continuously. The                            Endoscope was introduced through the mouth, and                            advanced to the second part of duodenum. The upper                            GI endoscopy was accomplished  without difficulty.                            The patient tolerated the procedure well. Scope In: Scope Out: Findings:                 The larynx was normal.                           LA Grade B (one or more mucosal breaks greater than                            5 mm, not extending between the tops of two mucosal                            folds) esophagitis with no bleeding was found in                            the lower third of the esophagus.                           There were esophageal mucosal changes suspicious                            for short-segment Barrett's esophagus present in                            the distal esophagus. Two tongues of salmon-colored                            mucosa extending above the upper limit of the                            gastric folds. The maximum longitudinal extent of                            these mucosal changes was 2 cm in length. There  were no raised or suspicious areas within these                            mucosal changes. Mucosa was biopsied with a cold                            forceps for histology randomly. One specimen bottle                            was sent to pathology.                           A 5 cm hiatal hernia was present.                           Evidence of a fundoplication was found in the                            cardia. The wrap appeared intact.                           The exam of the stomach was otherwise normal,                            including on retroflexion.                           The examined duodenum was normal. Complications:            No immediate complications. Estimated Blood Loss:     Estimated blood loss was minimal. Impression:               - Normal larynx.                           - LA Grade B reflux esophagitis.                           - Esophageal mucosal changes suspicious for                            short-segment Barrett's esophagus.  Biopsied.                           - 5 cm hiatal hernia.                           - A fundoplication was found. The wrap appears                            intact.                           - Normal examined duodenum.                           There is no stricture.  Dysphagia due to recurrence of hiatal hernia and                            probable reflux-related dysmotility. Recommendation:           - Patient has a contact number available for                            emergencies. The signs and symptoms of potential                            delayed complications were discussed with the                            patient. Return to normal activities tomorrow.                            Written discharge instructions were provided to the                            patient.                           - Resume previous diet.                           - Continue present medications. The patient is                            already on twice daily PPI.                           - Await pathology results, after which office                            follow up will be arranged.                           - Use sucralfate suspension 1 gram (10cc) PO twice                            daily for 2 months. Disp 420 ml bottle, RF #2 Kamil Mchaffie L. Myrtie Neither, MD 12/30/2016 11:53:59 AM This report has been signed electronically.

## 2016-12-31 ENCOUNTER — Telehealth: Payer: Self-pay | Admitting: *Deleted

## 2016-12-31 NOTE — Telephone Encounter (Signed)
Agree with ER evaluation No dilation so do not think perforation Cardiac problem certainly possible

## 2016-12-31 NOTE — Telephone Encounter (Signed)
  Follow up Call-  Call back number 12/30/2016  Post procedure Call Back phone  # [phone]  Permission to leave phone message Yes  Some recent data might be hidden    [phone] Patient questions:  Do you have a fever, pain , or abdominal swelling? Yes.   Pain Score  see below *  Have you tolerated food without any problems? Yes.    Have you been able to return to your normal activities? No.  Do you have any questions about your discharge instructions: Diet   No. Medications  No. Follow up visit  No  Do you have questions or concerns about your Care? No.  Actions: * If pain score is 4 or above: Physician/ provider Notified : Charlie PitterHenry L Danis, MD., Dr. Leone PayorGessner (doctor of the day)  Pt states she had severe chest pain and pain between her shoulder blades starting the middle of the night.  She is unable to rate the pain, just states "it is really bad."  She was able to eat yesterday but hasn't eaten today.  Her family wanted to call the on call MD last night, but she wouldn't let them.  She states pain is still severe today.  I told her that she needs to go to the ER to have this pain evaluated.  Understanding voiced.

## 2017-01-03 NOTE — Telephone Encounter (Signed)
Agreed - to ED.  Patient with chronic chest pain.  This episode may be cardiac in nature.

## 2017-01-05 ENCOUNTER — Other Ambulatory Visit: Payer: Self-pay | Admitting: Internal Medicine

## 2017-01-11 ENCOUNTER — Encounter: Payer: Self-pay | Admitting: Gastroenterology

## 2017-01-24 ENCOUNTER — Other Ambulatory Visit: Payer: Self-pay | Admitting: Internal Medicine

## 2017-01-25 ENCOUNTER — Other Ambulatory Visit: Payer: Self-pay | Admitting: *Deleted

## 2017-01-25 MED ORDER — ESCITALOPRAM OXALATE 20 MG PO TABS: 20.0000 mg | ORAL_TABLET | Freq: Every day | ORAL | 0 refills | Status: DC

## 2017-01-25 NOTE — Telephone Encounter (Signed)
Received faxed refill request from [facility] [address]) for escitalopram  tabs take one daily #30.  Rx written and filled on 12/07/2016.  I attempted to contact patient to find out how she is currently taking medication.  No answer, Hippa compliant message left on recorder.  Will go ahead and forward refill request to pcp for review, please advise.Kingsley Spittle [name]10/2/20183:29 PM

## 2017-02-07 ENCOUNTER — Ambulatory Visit (INDEPENDENT_AMBULATORY_CARE_PROVIDER_SITE_OTHER): Payer: Medicare HMO | Admitting: Gastroenterology

## 2017-02-07 ENCOUNTER — Encounter (INDEPENDENT_AMBULATORY_CARE_PROVIDER_SITE_OTHER): Payer: Self-pay

## 2017-02-07 ENCOUNTER — Encounter: Payer: Self-pay | Admitting: Gastroenterology

## 2017-02-07 VITALS — BP 102/68 | HR 72 | Ht 60.0 in | Wt 119.1 lb

## 2017-02-07 DIAGNOSIS — R490 Dysphonia: Secondary | ICD-10-CM

## 2017-02-07 DIAGNOSIS — K2100 Gastro-esophageal reflux disease with esophagitis, without bleeding: Secondary | ICD-10-CM

## 2017-02-07 DIAGNOSIS — K449 Diaphragmatic hernia without obstruction or gangrene: Secondary | ICD-10-CM

## 2017-02-07 MED ORDER — DEXLANSOPRAZOLE 60 MG PO CPDR: 60.0000 mg | DELAYED_RELEASE_CAPSULE | Freq: Every day | ORAL | 0 refills | Status: DC

## 2017-02-07 NOTE — Progress Notes (Signed)
[facility] Progress Note  Chief Complaint: GERD and dysphagia  Subjective  History:  Sherri Mcmillan is here for follow-up of her GERD. As before, she is a tangential oriented and somewhat difficult to follow. Her upper endoscopy showed a 5 cm hiatal hernia, intact fundoplication and reflux esophagitis. She has nocturnal water brash and frequent heartburn during the day and night. Over the last several days she has developed hoarseness of unclear cause. She frequently coughs or spits up mucus. Sometimes just belching causes severe chest pain. She also was talking about some of her medicines have caused side effects of mental status change, so she does not take them anymore. It is not clear which medicines these are. It seems that she probably took a trial of Carafate that I prescribed, but is not clear if it made her feel any better.  ROS: Cardiovascular:  no chest pain Respiratory: no dyspnea  The patient's Past Medical, Family and Social History were reviewed and are on file in the EMR.  Objective:  Med list reviewed  Current Outpatient Prescriptions:  .  albuterol (PROVENTIL HFA;VENTOLIN HFA) 108 (90 Base) MCG/ACT inhaler, Inhale 2 puffs into the lungs every 4 (four) hours as needed for wheezing or shortness of breath., Disp: 1 Inhaler, Rfl: 0 .  amLODipine (NORVASC) 10 MG tablet, Take 1 tablet (10 mg total) by mouth daily., Disp: 90 tablet, Rfl: 1 .  escitalopram (LEXAPRO) 20 MG tablet, Take 1 tablet (20 mg total) by mouth daily., Disp: 90 tablet, Rfl: 0 .  ferrous sulfate 325 (65 FE) MG tablet, Take 325 mg by mouth 2 (two) times daily with a meal., Disp: , Rfl:  .  gabapentin (NEURONTIN) 600 MG tablet, Take 1 tablet (600 mg total) by mouth at bedtime., Disp: 30 tablet, Rfl: 1 .  hydrOXYzine (ATARAX/VISTARIL) 25 MG tablet, TAKE 1 TABLET (25 MG TOTAL) BY MOUTH 3 (THREE) TIMES DAILY AS NEEDED., Disp: 30 tablet, Rfl: 0 .  nortriptyline (PAMELOR) 10 MG capsule, Take 1 capsule (10 mg  total) by mouth at bedtime., Disp: 30 capsule, Rfl: 2 .  sucralfate (CARAFATE) 1 GM/10ML suspension, Take 10 mLs (1 g total) by mouth 2 (two) times daily., Disp: 420 mL, Rfl: 1 .  tiZANidine (ZANAFLEX) 2 MG tablet, Take 1 tablet (2 mg total) by mouth at bedtime as needed for muscle spasms., Disp: 20 tablet, Rfl: 3 .  dexlansoprazole (DEXILANT) 60 MG capsule, Take 1 capsule (60 mg total) by mouth daily., Disp: 20 capsule, Rfl: 0  Current Facility-Administered Medications:  .  0.9 %  sodium chloride infusion, 500 mL, Intravenous, Continuous, Danis, Starr Lake III, MD   Vital signs in last 24 hrs: Vitals:   02/07/17 1551  BP: 102/68  Pulse: 72    Physical Exam  Her voice is hoarse today  HEENT: sclera anicteric, oral mucosa moist without lesions  Neck: supple, no thyromegaly, JVD or lymphadenopathy  Cardiac: RRR without murmurs, S1S2 heard, no peripheral edema  Pulm: clear to auscultation bilaterally, normal RR and effort noted  Abdomen: soft, no tenderness, with active bowel sounds. No guarding or palpable hepatosplenomegaly.  Skin; warm and dry, no jaundice or rash   @ Assessment: Encounter Diagnoses  Name Primary?  . Gastroesophageal reflux disease with esophagitis Yes  . Hiatal hernia   . Hoarseness    She definitely has reflux, but I suspect there may be some element of visceral hypersensitivity with an apparent underlying chronic pain syndrome. It is not clear why the hiatal  hernia was not repaired at the time of fundoplication, and it is also not clear how much that is really contributing to the reflux. I am not prepared to send her for surgical reevaluation at this point because it is not clear whether or not repairing the diaphragmatic defect will improve her reflux symptoms. He is also not clear if this recent hoarseness is related to the reflux or some other pulmonary/ENT cause.   Plan: Trial of Dexilant 60 mg once daily in place of  Protonix   Total time 20 minutes, over half spent in counseling and coordination of care.   Charlie Pitter III

## 2017-02-07 NOTE — Patient Instructions (Addendum)
If you are age 81 or older, your body mass index should be between 23-30. Your Body mass index is 23.27 kg/m. If this is out of the aforementioned range listed, please consider follow up with your Primary Care Provider.  If you are age 92 or younger, your body mass index should be between 19-25. Your Body mass index is 23.27 kg/m. If this is out of the aformentioned range listed, please consider follow up with your Primary Care Provider.   Please stop pantoprazole and start Dexilant 60 mg once a day.  Samples of this drug were given to the patient, quantity #20 , Lot Number [id number] Lot # 06-2019   Thank you for choosing [facility]  Dr Amada Jupiter III

## 2017-02-14 NOTE — Telephone Encounter (Signed)
See phone encounter.

## 2017-02-21 ENCOUNTER — Other Ambulatory Visit: Payer: Self-pay | Admitting: *Deleted

## 2017-02-21 DIAGNOSIS — G44229 Chronic tension-type headache, not intractable: Secondary | ICD-10-CM

## 2017-02-21 MED ORDER — FERROUS SULFATE 325 (65 FE) MG PO TABS: 325.0000 mg | ORAL_TABLET | Freq: Two times a day (BID) | ORAL | 2 refills | Status: DC

## 2017-02-21 MED ORDER — NORTRIPTYLINE HCL 10 MG PO CAPS: 10.0000 mg | ORAL_CAPSULE | Freq: Every day | ORAL | 2 refills | Status: DC

## 2017-03-12 LAB — GLUCOSE, POCT (MANUAL RESULT ENTRY): POC GLUCOSE: 134 mg/dL — AB (ref 70–99)

## 2017-03-12 LAB — POCT LIPID PANEL
HDL: 120
TC: 193
TRG: 54

## 2017-03-16 ENCOUNTER — Other Ambulatory Visit: Payer: Self-pay | Admitting: Neurology

## 2017-03-21 ENCOUNTER — Other Ambulatory Visit: Payer: Self-pay | Admitting: *Deleted

## 2017-03-21 MED ORDER — TIZANIDINE HCL 2 MG PO TABS: 2.0000 mg | ORAL_TABLET | Freq: Every evening | ORAL | 3 refills | Status: DC | PRN

## 2017-03-21 NOTE — Telephone Encounter (Signed)
Refills given and note sent stating patient needs an appointment.

## 2017-04-01 ENCOUNTER — Telehealth: Payer: Self-pay | Admitting: Gastroenterology

## 2017-04-01 DIAGNOSIS — K2100 Gastro-esophageal reflux disease with esophagitis, without bleeding: Secondary | ICD-10-CM

## 2017-04-01 DIAGNOSIS — K449 Diaphragmatic hernia without obstruction or gangrene: Secondary | ICD-10-CM

## 2017-04-01 MED ORDER — SUCRALFATE 1 GM/10ML PO SUSP: 1.0000 g | Freq: Two times a day (BID) | ORAL | 2 refills | Status: DC

## 2017-04-01 NOTE — Addendum Note (Signed)
Addended by: Charlie PitterANIS, Gearld Kerstein L on: 04/01/2017 04:44 PM   Modules accepted: Orders

## 2017-04-01 NOTE — Telephone Encounter (Signed)
I sent a refill of carafate. Please reassure her.  If she vomits blood - go to ED.

## 2017-04-01 NOTE — Telephone Encounter (Signed)
Patient called, yesterday she experienced severe reflux burning in chest, was vomiting blood, states it was "coming out like water". She took some Tylenol for the pain, has not taken her carafate as she is out of that medication, only taking Nexium which is not helping. She did not go to the ED. No vomiting blood today, is still having reflux burning sensation. She did eat some toast and had some water today and kept that down. Please advise.

## 2017-04-01 NOTE — Telephone Encounter (Signed)
Spoke to patient and let her know to [facility]ate. She wanted to make an appointment to see Dr. Myrtie Neitheranis. Scheduled for Wednesday, 12/12. She understands to go to the ED if she should having more vomiting of blood.

## 2017-04-06 ENCOUNTER — Ambulatory Visit: Payer: Medicare HMO | Admitting: Gastroenterology

## 2017-05-02 ENCOUNTER — Other Ambulatory Visit: Payer: Self-pay | Admitting: *Deleted

## 2017-05-02 NOTE — Telephone Encounter (Signed)
Next appt scheduled  06/09/17 with PCP. 

## 2017-05-03 MED ORDER — ESCITALOPRAM OXALATE 20 MG PO TABS: 20.0000 mg | ORAL_TABLET | Freq: Every day | ORAL | 0 refills | Status: DC

## 2017-05-20 ENCOUNTER — Encounter (INDEPENDENT_AMBULATORY_CARE_PROVIDER_SITE_OTHER): Payer: Self-pay

## 2017-05-20 ENCOUNTER — Ambulatory Visit (INDEPENDENT_AMBULATORY_CARE_PROVIDER_SITE_OTHER): Payer: Medicare Other | Admitting: Gastroenterology

## 2017-05-20 ENCOUNTER — Encounter: Payer: Self-pay | Admitting: Gastroenterology

## 2017-05-20 VITALS — BP 114/72 | HR 76 | Ht 60.0 in | Wt 120.4 lb

## 2017-05-20 DIAGNOSIS — Z9889 Other specified postprocedural states: Secondary | ICD-10-CM | POA: Diagnosis not present

## 2017-05-20 DIAGNOSIS — K2100 Gastro-esophageal reflux disease with esophagitis, without bleeding: Secondary | ICD-10-CM

## 2017-05-20 DIAGNOSIS — K449 Diaphragmatic hernia without obstruction or gangrene: Secondary | ICD-10-CM | POA: Diagnosis not present

## 2017-05-20 MED ORDER — METOCLOPRAMIDE HCL 5 MG PO TABS: 5.0000 mg | ORAL_TABLET | Freq: Every day | ORAL | 0 refills | Status: DC

## 2017-05-20 NOTE — Progress Notes (Signed)
[facility] Progress Note  Chief Complaint: GERD and hematemesis  Subjective  History:  Sherri Mcmillan follows up for her reflux.  She has had long-standing regurgitation with water brash and burning chest pain despite acid suppression.  There has seemed like an element of visceral hypersensitivity, chronic pain syndrome overlay.  She had a previous fundoplication but still has a hiatal hernia and patulous LES on recent upper endoscopy; reflux esophagitis and Barrett's esophagus without dysplasia also seen.  She has reported hematemesis on several occasions.  As before, she is a difficult and tangential historian.   She is accompanied by her pastor, who she hoped could help corroborate her story.[name] says she is miserable from this condition, she has heartburn and regurgitation 24 hours a day, but worse at night.  She also again reports having episodes of vomiting blood, which is difficult to characterize but sounds like it is some dark material.  She did take the Carafate a few times a day until she ran out of it last week, but got no relief from her heartburn or chest pain.  ROS: Cardiovascular:  no chest pain Respiratory: no dyspnea  The patient's Past Medical, Family and Social History were reviewed and are on file in the EMR.  Objective:  Med list reviewed  Current Outpatient Medications:  .  albuterol (PROVENTIL HFA;VENTOLIN HFA) 108 (90 Base) MCG/ACT inhaler, Inhale 2 puffs into the lungs every 4 (four) hours as needed for wheezing or shortness of breath., Disp: 1 Inhaler, Rfl: 0 .  amLODipine (NORVASC) 10 MG tablet, Take 1 tablet (10 mg total) by mouth daily., Disp: 90 tablet, Rfl: 1 .  dexlansoprazole (DEXILANT) 60 MG capsule, Take 1 capsule (60 mg total) by mouth daily., Disp: 20 capsule, Rfl: 0 .  escitalopram (LEXAPRO) 20 MG tablet, Take 1 tablet (20 mg total) by mouth daily., Disp: 90 tablet, Rfl: 0 .  gabapentin (NEURONTIN) 600 MG tablet, Take 1 tablet (600 mg total) by  mouth at bedtime., Disp: 30 tablet, Rfl: 1 .  hydrOXYzine (ATARAX/VISTARIL) 25 MG tablet, TAKE 1 TABLET (25 MG TOTAL) BY MOUTH 3 (THREE) TIMES DAILY AS NEEDED., Disp: 30 tablet, Rfl: 0 .  nortriptyline (PAMELOR) 10 MG capsule, Take 1 capsule (10 mg total) by mouth at bedtime., Disp: 30 capsule, Rfl: 2 .  sucralfate (CARAFATE) 1 GM/10ML suspension, Take 10 mLs (1 g total) by mouth 2 (two) times daily., Disp: 420 mL, Rfl: 2 .  tiZANidine (ZANAFLEX) 2 MG tablet, Take 1 tablet (2 mg total) by mouth at bedtime as needed for muscle spasms., Disp: 20 tablet, Rfl: 3 .  metoCLOPramide (REGLAN) 5 MG tablet, Take 1 tablet (5 mg total) by mouth at bedtime., Disp: 30 tablet, Rfl: 0  Current Facility-Administered Medications:  .  0.9 %  sodium chloride infusion, 500 mL, Intravenous, Continuous, Danis, Starr LakeHenry L III, MD   Vital signs in last 24 hrs: Vitals:   05/20/17 1003  BP: 114/72  Pulse: 76    Physical Exam Her female pastor was present the entire encounter Not acutely ill-appearing, no  muscle wasting.  HEENT: sclera anicteric, oral mucosa moist without lesions  Neck: supple, no thyromegaly, JVD or lymphadenopathy  Cardiac: RRR without murmurs, S1S2 heard, no peripheral edema  Pulm: clear to auscultation bilaterally, normal RR and effort noted  Abdomen: soft, mild epigastric tenderness, with active bowel sounds. No guarding or palpable hepatosplenomegaly.  Skin; warm and dry, no jaundice or rash  During discussion of her reflux condition, she varies  her face and her hands and rocks back and forth while describing her misery over this condition.    @ASSESSMENTPLANBEGIN @ Assessment: Encounter Diagnoses  Name Primary?  . Gastroesophageal reflux disease with esophagitis Yes  . Hiatal hernia   . S/P Nissen fundoplication (without gastrostomy tube) procedure     I have no doubt she has reflux based on symptoms and endoscopic findings, but I still suspect there is a significant element  of anxiety/visceral hypersensitivity overlay.  That is a problem I am not certain I can fix.  I would not add a TCA to this elderly patient out of concern for anticholinergic side effects.  I will copy this to her PCP in case they wish to consider any other therapies for anxiety.  While she does have a hiatal hernia and a patulous LES, I am reluctant to send her for surgical evaluation to consider repair of the hiatal hernia since I think it would be of uncertain benefit.  Plan: Continue high-dose acid suppression as she is currently on Trial of metoclopramide 5 mg nightly since she has worsened nocturnal symptoms.   Total time 25 minutes, over half spent in counseling and coordination of care.   Charlie Pitter III

## 2017-05-20 NOTE — Patient Instructions (Signed)
If you are age 82 or older, your body mass index should be between 23-30. Your Body mass index is 23.51 kg/m. If this is out of the aforementioned range listed, please consider follow up with your Primary Care Provider.  If you are age 82 or younger, your body mass index should be between 19-25. Your Body mass index is 23.51 kg/m. If this is out of the aformentioned range listed, please consider follow up with your Primary Care Provider.   Follow up as needed.  Thank you for choosing [facility]  Dr Amada JupiterHenry Danis III

## 2017-06-03 ENCOUNTER — Other Ambulatory Visit: Payer: Self-pay | Admitting: Neurology

## 2017-06-09 ENCOUNTER — Encounter: Payer: Self-pay | Admitting: Internal Medicine

## 2017-06-09 ENCOUNTER — Ambulatory Visit (INDEPENDENT_AMBULATORY_CARE_PROVIDER_SITE_OTHER): Payer: Medicare Other | Admitting: Internal Medicine

## 2017-06-09 ENCOUNTER — Other Ambulatory Visit: Payer: Self-pay

## 2017-06-09 ENCOUNTER — Other Ambulatory Visit: Payer: Self-pay | Admitting: *Deleted

## 2017-06-09 VITALS — BP 153/7 | HR 90 | Temp 98.3°F | Ht 60.0 in | Wt 121.1 lb

## 2017-06-09 DIAGNOSIS — K2100 Gastro-esophageal reflux disease with esophagitis, without bleeding: Secondary | ICD-10-CM

## 2017-06-09 DIAGNOSIS — Z79899 Other long term (current) drug therapy: Secondary | ICD-10-CM | POA: Diagnosis not present

## 2017-06-09 DIAGNOSIS — G4700 Insomnia, unspecified: Secondary | ICD-10-CM

## 2017-06-09 DIAGNOSIS — I10 Essential (primary) hypertension: Secondary | ICD-10-CM

## 2017-06-09 DIAGNOSIS — K2270 Barrett's esophagus without dysplasia: Secondary | ICD-10-CM | POA: Diagnosis not present

## 2017-06-09 DIAGNOSIS — F339 Major depressive disorder, recurrent, unspecified: Secondary | ICD-10-CM

## 2017-06-09 DIAGNOSIS — F32A Depression, unspecified: Secondary | ICD-10-CM

## 2017-06-09 DIAGNOSIS — K449 Diaphragmatic hernia without obstruction or gangrene: Secondary | ICD-10-CM

## 2017-06-09 DIAGNOSIS — F329 Major depressive disorder, single episode, unspecified: Secondary | ICD-10-CM

## 2017-06-09 DIAGNOSIS — F419 Anxiety disorder, unspecified: Secondary | ICD-10-CM | POA: Diagnosis not present

## 2017-06-09 MED ORDER — TRAZODONE HCL 50 MG PO TABS: 50.0000 mg | ORAL_TABLET | Freq: Every day | ORAL | 2 refills | Status: DC

## 2017-06-09 MED ORDER — SUCRALFATE 1 GM/10ML PO SUSP: 1.0000 g | Freq: Two times a day (BID) | ORAL | 2 refills | Status: DC

## 2017-06-09 MED ORDER — PANTOPRAZOLE SODIUM 40 MG PO TBEC: 80.0000 mg | DELAYED_RELEASE_TABLET | Freq: Two times a day (BID) | ORAL | 2 refills | Status: DC

## 2017-06-09 NOTE — Assessment & Plan Note (Signed)
Patient presented for continued evaluation and management of her GERD. Per chart review she was recently evaluated by GI in 01/19. They recommended adding metoclopramide and continue acid suppression therapy. Also recommended treating her anxiety as it may be playing a role.   Patient states that since October she has had worsening or her GERD symptoms with recurrent episodes of emesis. These episodes initially start off non-bloody however, towards the end she begins to experience bloody emesis. He most recent episode was this past weekend. She denies chest pain during these episodes. She has a lot of anxiety around these episodes and worries about when they will recur. She is very unhappy with her recent GI appointment because she was told surgery was not an option. She acknowledges that anxiety is likely playing a rule in her symptoms but does feel that her GERD is playing a larger portion. She has not been taking her sucralfate or PPI because she did not feel they were working. The pain appears worse at night when she lays down. She denies chest pain, SOB, weight loss, choking sensation. She denies the use of coffee, EtOH, tobacco, or eating acidic foods.   She did have an EGD in 12/2016 that did show Barrett's esophagus and a hiatal hernia.   Based on her story I think she may be experiencing Mallory Weiss tears secondary to frequent retching due to her GERD/anxiety. We discussed that we will need to restart high dose PPI, her sucralfate, and trazodone for insomnia/anxiety/depression. She will need frequent follow-up and if symptoms do not improve she will need another endoscopy.   Plan:  - Pantoprazole 80 mg BID  - Sucralfate 1 g BID  - Follow-up in 2 weeks, if symptoms are not improved will call GI to evaluate for repeat endoscopy - Return precautions given

## 2017-06-09 NOTE — Assessment & Plan Note (Signed)
Discontinued the patients nortriptyline today (no washout needed since she was only on 10 mg QD) and started trazodone 50 mg QHS for anxiety/depression.

## 2017-06-09 NOTE — Patient Instructions (Signed)
It was a pleasure to see you today. We have started some new medications. I sent them to your pharmacy. Please pick them up and take them as prescribed. I would like you to come back to the clinic in 2 weeks for a repeat visit. Please take the acid blocking medicine and let me know if things are not improving.

## 2017-06-09 NOTE — Progress Notes (Signed)
   CC: Follow-up of her GERD  HPI:  [name] is a 84 y.o. with Barrette's esophagus due to GERD who presented for continued evaluation and management of her GERD. Please refer to problem based charting for a detailed evaluation and management plan.   Past Medical History:  Diagnosis Date  . Anemia 12/14/2012  . Aortic stenosis    Dx ECHO 2008 , mild and aymptomatic , needs ECHO q 3-5 yrs  . Barrett's esophagus    Demonstrated on EGD 12/2010. EGD 09/17/2012 shows inflamed GE junctional mucosa without metaplasia, dysplasia, or malignancy.  . Bronchitis    hx of  . Cataract   . Chronic anxiety    [facility]  for 6 weeks after mother died. Complicated again by death of her sister 2010. 2012 developed hallucinations and I refused to refill controlled meds unless she see psych which she is not agreable to  . Chronic insomnia   . Chronic pain    DJD knees, back, migranes, LLE varicose veins, and LLE neuropathy.   . Depression   . DVT (deep venous thrombosis) (HCC)   . Elevated cholesterol 10/11   LDL 155. Her 10 year risk (decreasing her age to [age] as the calculator won't go to age 84) is 21% ish. If consider her non smoker (which I wouldn't even though she says 1 pak lasts 2 weeks) risk is 12% ish. So goal for LDL is 130 or 100.  Marland Kitchen. Gastroparesis    Demonstrated on GES 12/2010 by Dr Dalene SeltzerKapland  . GERD (gastroesophageal reflux disease)   . H/O hiatal hernia   . HTN (hypertension)    Controlled with 2 drug therapy  . Left leg DVT (HCC) 09/28/2012  . Migraine    sometimes   Review of Systems:   12 point ROS preformed. All negative aside form that mentioned in the HPI.   Physical Exam: Vitals:   06/09/17 1340  BP: (!) 153/7  Pulse: 90  Temp: 98.3 F (36.8 C)  TempSrc: Oral  SpO2: 99%  Weight: 121 lb 1.6 oz (54.9 kg)  Height: 5' (1.524 m)   General: Well nourished female in emotional distress HENT: Normocephalic, atraumatic, moist mucus membranes    Pulm: Good air movement with no wheezing or crackles  CV: RRR, no murmurs, no rubs  Abdomen: Active bowel sounds, soft, non-distended, epigastric tenderness Extremities:Pulses palpable in all extremities, no LE edema  Skin: Warm and dry  Neuro: Alert and oriented    Assessment & Plan:   See Encounters Tab for problem based charting.  Patient discussed with Dr. Criselda PeachesMullen

## 2017-06-10 MED ORDER — AMLODIPINE BESYLATE 10 MG PO TABS: 10.0000 mg | ORAL_TABLET | Freq: Every day | ORAL | 0 refills | Status: DC

## 2017-06-10 NOTE — Progress Notes (Signed)
Internal Medicine Clinic Attending  Case discussed with Dr. Caron PresumeHelberg at the time of the visit.  We reviewed the resident's history and exam and pertinent patient test results.  I agree with the assessment, diagnosis, and plan of care documented in the resident's note.  Agree with trial of trazodone.  May need further treatment for anxiety including Buproprion or an SSRI in the future.

## 2017-06-16 ENCOUNTER — Other Ambulatory Visit: Payer: Self-pay | Admitting: *Deleted

## 2017-06-24 ENCOUNTER — Other Ambulatory Visit: Payer: Self-pay

## 2017-06-24 ENCOUNTER — Encounter: Payer: Self-pay | Admitting: Internal Medicine

## 2017-06-24 ENCOUNTER — Ambulatory Visit (INDEPENDENT_AMBULATORY_CARE_PROVIDER_SITE_OTHER): Payer: Medicare Other | Admitting: Internal Medicine

## 2017-06-24 VITALS — BP 163/98 | HR 76 | Temp 98.4°F | Ht 60.0 in | Wt 123.6 lb

## 2017-06-24 DIAGNOSIS — Z79899 Other long term (current) drug therapy: Secondary | ICD-10-CM

## 2017-06-24 DIAGNOSIS — F419 Anxiety disorder, unspecified: Secondary | ICD-10-CM | POA: Diagnosis not present

## 2017-06-24 DIAGNOSIS — R011 Cardiac murmur, unspecified: Secondary | ICD-10-CM

## 2017-06-24 DIAGNOSIS — F32A Depression, unspecified: Secondary | ICD-10-CM

## 2017-06-24 DIAGNOSIS — K219 Gastro-esophageal reflux disease without esophagitis: Secondary | ICD-10-CM | POA: Diagnosis not present

## 2017-06-24 DIAGNOSIS — I10 Essential (primary) hypertension: Secondary | ICD-10-CM

## 2017-06-24 DIAGNOSIS — F329 Major depressive disorder, single episode, unspecified: Secondary | ICD-10-CM

## 2017-06-24 DIAGNOSIS — K2100 Gastro-esophageal reflux disease with esophagitis, without bleeding: Secondary | ICD-10-CM

## 2017-06-24 DIAGNOSIS — F339 Major depressive disorder, recurrent, unspecified: Secondary | ICD-10-CM

## 2017-06-24 DIAGNOSIS — G4700 Insomnia, unspecified: Secondary | ICD-10-CM | POA: Diagnosis not present

## 2017-06-24 MED ORDER — TRAZODONE HCL 100 MG PO TABS: 100.0000 mg | ORAL_TABLET | Freq: Every day | ORAL | 0 refills | Status: DC

## 2017-06-24 NOTE — Assessment & Plan Note (Signed)
Patient has insomnia, decreased appetites, feeling depressed, emotional lability, and severe anxiety. She is currently very distressed over her insomnia. She is on Trazodone 50 mg QHS and Escitalopram 20 mg QD.   Today we will increase her Trazodone to 100 mg QHS. We will see her back in 2 weeks and if her insomnia is not improved we will consider changing her Escitalopram. Although the time frame of follow-up is not sufficient to see adequate response for her anxiety/depression I am hopeful it will provide enough to help her insomnia.

## 2017-06-24 NOTE — Progress Notes (Signed)
   CC: Follow-up for her Insomnia and GERD  HPI:  [name] is a 84 y.o. with insomnia and GERD who presented to the clinic for continued evaluation and management of her chronic medical conditions. For a detailed evaluation and management plan please refer to problem based charting.   Past Medical History:  Diagnosis Date  . Anemia 12/14/2012  . Aortic stenosis    Dx ECHO 2008 , mild and aymptomatic , needs ECHO q 3-5 yrs  . Barrett's esophagus    Demonstrated on EGD 12/2010. EGD 09/17/2012 shows inflamed GE junctional mucosa without metaplasia, dysplasia, or malignancy.  . Bronchitis    hx of  . Cataract   . Chronic anxiety    [facility]  for 6 weeks after mother died. Complicated again by death of her sister 2010. 2012 developed hallucinations and I refused to refill controlled meds unless she see psych which she is not agreable to  . Chronic insomnia   . Chronic pain    DJD knees, back, migranes, LLE varicose veins, and LLE neuropathy.   . Depression   . DVT (deep venous thrombosis) (HCC)   . Elevated cholesterol 10/11   LDL 155. Her 10 year risk (decreasing her age to [age] as the calculator won't go to age 84) is 21% ish. If consider her non smoker (which I wouldn't even though she says 1 pak lasts 2 weeks) risk is 12% ish. So goal for LDL is 130 or 100.  Marland Kitchen. Gastroparesis    Demonstrated on GES 12/2010 by Dr Dalene SeltzerKapland  . GERD (gastroesophageal reflux disease)   . H/O hiatal hernia   . HTN (hypertension)    Controlled with 2 drug therapy  . Left leg DVT (HCC) 09/28/2012  . Migraine    sometimes   Review of Systems:   12 point ROS preformed. All negative aside from those mentioned in the HPI.  Physical Exam: Vitals:   06/24/17 1013  BP: (!) 163/98  Pulse: 76  Temp: 98.4 F (36.9 C)  TempSrc: Oral  SpO2: 100%  Weight: 123 lb 9.6 oz (56.1 kg)  Height: 5' (1.524 m)   General: Well nourished female in no acute distress HENT: Normocephalic,  atraumatic, moist mucus membranes Pulm: Good air movement with no wheezing or crackles  CV: RRR, holosystolic murmur noted  Abdomen: Active bowel sounds, soft, non-distended, no tenderness to palpation  Extremities: Pulses palpable in all extremities, no LE edema  Skin: Warm and dry  Neuro: Alert and oriented x 3  Assessment & Plan:   See Encounters Tab for problem based charting.  Patient discussed with Dr. Rogelia BogaButcher

## 2017-06-24 NOTE — Assessment & Plan Note (Signed)
Patient was seen on 2/14 for severe GERD related symptoms that were effecting her sleep, PO intake, and causing excessive N/V. At her last appointment she disclosed that she had not been taking her GERD medications as prescribed because she did not feel they were making a difference. Therefore, we restarted her on high dose PPI, pantoprazole 80 mg BID. Since her last visit her symptoms are no longer daily and she has had only two episodes of abdominal pain that were short lived. She is not taking the sucralfate because it makes her sick.   She was encouraged to continue her PPI and we will continue to follow.

## 2017-06-24 NOTE — Assessment & Plan Note (Signed)
BP elevated today and patient did not take her amlodipine. No changes made to management this visit.

## 2017-06-24 NOTE — Assessment & Plan Note (Addendum)
Patient is very emotional today about her insomnia and inability to both get to and maintain her sleep. She feels this is related to her nerves and cannot stop worrying about stuff. She is extremely upset today and endorses depressive symptoms. She is currently on Trazodone 50 mg QHS and Escitalopram 20 mg QD.   Unfortunately due to the patient's again and relative contraindications to multiple medication her insomnia is more difficult to treat. Since the underlying issue appears to be related to anxiety/depression we will focus our efforts there. Please refer to the problem "anxiety and depression" for a detailed management plan.

## 2017-06-24 NOTE — Patient Instructions (Signed)
It was a pleasure to see you today. Today we increased your Trazodone to help you sleep better. Please come back in 2 weeks to so we can see if this change is helping.   Insomnia Insomnia is a sleep disorder that makes it difficult to fall asleep or to stay asleep. Insomnia can cause tiredness (fatigue), low energy, difficulty concentrating, mood swings, and poor performance at work or school. There are three different ways to classify insomnia:  Difficulty falling asleep.  Difficulty staying asleep.  Waking up too early in the morning.  Any type of insomnia can be long-term (chronic) or short-term (acute). Both are common. Short-term insomnia usually lasts for three months or less. Chronic insomnia occurs at least three times a week for longer than three months. What are the causes? Insomnia may be caused by another condition, situation, or substance, such as:  Anxiety.  Certain medicines.  Gastroesophageal reflux disease (GERD) or other gastrointestinal conditions.  Asthma or other breathing conditions.  Restless legs syndrome, sleep apnea, or other sleep disorders.  Chronic pain.  Menopause. This may include hot flashes.  Stroke.  Abuse of alcohol, tobacco, or illegal drugs.  Depression.  Caffeine.  Neurological disorders, such as Alzheimer disease.  An overactive thyroid (hyperthyroidism).  The cause of insomnia may not be known. What increases the risk? Risk factors for insomnia include:  Gender. Women are more commonly affected than men.  Age. Insomnia is more common as you get older.  Stress. This may involve your professional or personal life.  Income. Insomnia is more common in people with lower income.  Lack of exercise.  Irregular work schedule or night shifts.  Traveling between different time zones.  What are the signs or symptoms? If you have insomnia, trouble falling asleep or trouble staying asleep is the main symptom. This may lead to  other symptoms, such as:  Feeling fatigued.  Feeling nervous about going to sleep.  Not feeling rested in the morning.  Having trouble concentrating.  Feeling irritable, anxious, or depressed.  How is this treated? Treatment for insomnia depends on the cause. If your insomnia is caused by an underlying condition, treatment will focus on addressing the condition. Treatment may also include:  Medicines to help you sleep.  Counseling or therapy.  Lifestyle adjustments.  Follow these instructions at home:  Take medicines only as directed by your health care provider.  Keep regular sleeping and waking hours. Avoid naps.  Keep a sleep diary to help you and your health care provider figure out what could be causing your insomnia. Include: ? When you sleep. ? When you wake up during the night. ? How well you sleep. ? How rested you feel the next day. ? Any side effects of medicines you are taking. ? What you eat and drink.  Make your bedroom a comfortable place where it is easy to fall asleep: ? Put up shades or special blackout curtains to block light from outside. ? Use a white noise machine to block noise. ? Keep the temperature cool.  Exercise regularly as directed by your health care provider. Avoid exercising right before bedtime.  Use relaxation techniques to manage stress. Ask your health care provider to suggest some techniques that may work well for you. These may include: ? Breathing exercises. ? Routines to release muscle tension. ? Visualizing peaceful scenes.  Cut back on alcohol, caffeinated beverages, and cigarettes, especially close to bedtime. These can disrupt your sleep.  Do not overeat or eat spicy  foods right before bedtime. This can lead to digestive discomfort that can make it hard for you to sleep.  Limit screen use before bedtime. This includes: ? Watching TV. ? Using your smartphone, tablet, and computer.  Stick to a routine. This can help you  fall asleep faster. Try to do a quiet activity, brush your teeth, and go to bed at the same time each night.  Get out of bed if you are still awake after 15 minutes of trying to sleep. Keep the lights down, but try reading or doing a quiet activity. When you feel sleepy, go back to bed.  Make sure that you drive carefully. Avoid driving if you feel very sleepy.  Keep all follow-up appointments as directed by your health care provider. This is important. Contact a health care provider if:  You are tired throughout the day or have trouble in your daily routine due to sleepiness.  You continue to have sleep problems or your sleep problems get worse. Get help right away if:  You have serious thoughts about hurting yourself or someone else. This information is not intended to replace advice given to you by your health care provider. Make sure you discuss any questions you have with your health care provider. Document Released: 04/09/2000 Document Revised: 09/12/2015 Document Reviewed: 01/11/2014 Elsevier Interactive Patient Education  [employer].

## 2017-06-28 ENCOUNTER — Other Ambulatory Visit: Payer: Self-pay | Admitting: *Deleted

## 2017-06-28 NOTE — Progress Notes (Signed)
Internal Medicine Clinic Attending  Case discussed with Dr. Helberg at the time of the visit.  We reviewed the resident's history and exam and pertinent patient test results.  I agree with the assessment, diagnosis, and plan of care documented in the resident's note.    

## 2017-06-29 ENCOUNTER — Other Ambulatory Visit: Payer: Self-pay | Admitting: Internal Medicine

## 2017-06-29 NOTE — Telephone Encounter (Signed)
Refill request for tizanidine sent to PCP yesterday in separate encounter. Kinnie FeilL. Ducatte, RN, BSN

## 2017-06-29 NOTE — Telephone Encounter (Signed)
PATIENT WANTS REFILL ON MEDICATION FOR MUSCLE SPASM, [facility] ON [address]

## 2017-06-30 ENCOUNTER — Other Ambulatory Visit: Payer: Self-pay | Admitting: Neurology

## 2017-07-06 ENCOUNTER — Other Ambulatory Visit: Payer: Self-pay | Admitting: *Deleted

## 2017-07-06 DIAGNOSIS — K2100 Gastro-esophageal reflux disease with esophagitis, without bleeding: Secondary | ICD-10-CM

## 2017-07-06 MED ORDER — PANTOPRAZOLE SODIUM 40 MG PO TBEC: 80.0000 mg | DELAYED_RELEASE_TABLET | Freq: Two times a day (BID) | ORAL | 2 refills | Status: DC

## 2017-07-08 ENCOUNTER — Other Ambulatory Visit: Payer: Self-pay

## 2017-07-08 ENCOUNTER — Encounter: Payer: Self-pay | Admitting: Internal Medicine

## 2017-07-08 ENCOUNTER — Ambulatory Visit (INDEPENDENT_AMBULATORY_CARE_PROVIDER_SITE_OTHER): Payer: Medicare Other | Admitting: Internal Medicine

## 2017-07-08 VITALS — BP 127/88 | HR 80 | Temp 98.6°F | Ht 60.0 in | Wt 119.2 lb

## 2017-07-08 DIAGNOSIS — F419 Anxiety disorder, unspecified: Secondary | ICD-10-CM | POA: Diagnosis not present

## 2017-07-08 DIAGNOSIS — Z79899 Other long term (current) drug therapy: Secondary | ICD-10-CM

## 2017-07-08 DIAGNOSIS — F32A Depression, unspecified: Secondary | ICD-10-CM

## 2017-07-08 DIAGNOSIS — K2100 Gastro-esophageal reflux disease with esophagitis, without bleeding: Secondary | ICD-10-CM

## 2017-07-08 DIAGNOSIS — J42 Unspecified chronic bronchitis: Secondary | ICD-10-CM | POA: Diagnosis not present

## 2017-07-08 DIAGNOSIS — H538 Other visual disturbances: Secondary | ICD-10-CM

## 2017-07-08 DIAGNOSIS — K219 Gastro-esophageal reflux disease without esophagitis: Secondary | ICD-10-CM | POA: Diagnosis not present

## 2017-07-08 DIAGNOSIS — F339 Major depressive disorder, recurrent, unspecified: Secondary | ICD-10-CM

## 2017-07-08 DIAGNOSIS — F329 Major depressive disorder, single episode, unspecified: Secondary | ICD-10-CM

## 2017-07-08 MED ORDER — SUCRALFATE 1 GM/10ML PO SUSP: 1.0000 g | Freq: Every evening | ORAL | 0 refills | Status: DC | PRN

## 2017-07-08 MED ORDER — OMEPRAZOLE 40 MG PO CPDR: 40.0000 mg | DELAYED_RELEASE_CAPSULE | Freq: Two times a day (BID) | ORAL | 2 refills | Status: DC

## 2017-07-08 MED ORDER — ALBUTEROL SULFATE HFA 108 (90 BASE) MCG/ACT IN AERS: 2.0000 | INHALATION_SPRAY | RESPIRATORY_TRACT | 0 refills | Status: DC | PRN

## 2017-07-08 NOTE — Progress Notes (Signed)
   CC: Follow-up on her GERD and Anxiety/depression   HPI:  Sherri Mcmillan is a 84 y.o. female who presented to the clinic for continued evaluation and management of her GERD and Anxiety/Depression. For a detailed evaluation and management plan please refer to problem based charting below.   Past Medical History:  Diagnosis Date  . Anemia 12/14/2012  . Aortic stenosis    Dx ECHO 2008 , mild and aymptomatic , needs ECHO q 3-5 yrs  . Barrett's esophagus    Demonstrated on EGD 12/2010. EGD 09/17/2012 shows inflamed GE junctional mucosa without metaplasia, dysplasia, or malignancy.  . Bronchitis    hx of  . Cataract   . Chronic anxiety    [facility]  for 6 weeks after mother died. Complicated again by death of her sister 2010. 2012 developed hallucinations and I refused to refill controlled meds unless she see psych which she is not agreable to  . Chronic insomnia   . Chronic pain    DJD knees, back, migranes, LLE varicose veins, and LLE neuropathy.   . Depression   . DVT (deep venous thrombosis) (HCC)   . Elevated cholesterol 10/11   LDL 155. Her 10 year risk (decreasing her age to 6774 as the calculator won't go to age 84) is 21% ish. If consider her non smoker (which I wouldn't even though she says 1 pak lasts 2 weeks) risk is 12% ish. So goal for LDL is 130 or 100.  Marland Kitchen. Gastroparesis    Demonstrated on GES 12/2010 by Dr Dalene SeltzerKapland  . GERD (gastroesophageal reflux disease)   . H/O hiatal hernia   . HTN (hypertension)    Controlled with 2 drug therapy  . Left leg DVT (HCC) 09/28/2012  . Migraine    sometimes   Review of Systems:   Endorses blurry vision Denies SOB, Chest pain  Denies changes in urinary or bowel habits  Physical Exam: Vitals:   07/08/17 0942  BP: 127/88  Pulse: 80  Temp: 98.6 F (37 C)  TempSrc: Oral  SpO2: 100%  Weight: 119 lb 3.2 oz (54.1 kg)  Height: 5' (1.524 m)   General: Elderly female in no acute distress Pulm: Good air  movement with no wheezing or crackles  CV: RRR, no murmurs, no rubs  Psych: Appropriate mood and affect  Assessment & Plan:   See Encounters Tab for problem based charting.  Patient discussed with Dr. Heide SparkNarendra

## 2017-07-08 NOTE — Patient Instructions (Signed)
Thank you for allowing us to provide your care. I am so happy to see you are doing so well. Today we are making the following changes.   - STOP the Nortriptyline  - I have sent out another prescription for the Carafate  - Continue all you other medications as prescribed.

## 2017-07-08 NOTE — Assessment & Plan Note (Signed)
Patient seen for continued follow-up of her GERD. Her symptoms continue to be under better control with pantoprazole 80 mg BID and Carafate PRN. She states that she is no longer having daily symptoms and only about 1 to 2 times a week does she need to use the Carafate at night. She is out of the Carafate and asking for a refill.  Although her symptoms are well controlled on pantoprazole the medication is not covered by her insurance. We will transition her to omeprazole 40 mg twice daily 30 minutes before meals and continue her PRN Carafate. If her symptoms occur we will transition back to pantoprazole and see if insurance will approve.  Given that the patient is doing so well currently we will leave her on the BID PPI until her next follow-up in which point we will decrease it to once daily. We will continue the PRN Carafate

## 2017-07-08 NOTE — Assessment & Plan Note (Signed)
Patient requesting a refill of her albuterol. Refill sent to her local pharmacy.

## 2017-07-08 NOTE — Assessment & Plan Note (Signed)
Patient was seen on 3/1 with severe insomnia, decreased appetite, feelings of depression, emotional liability, and severe anxiety. She was on trazodone 50 mg QHS, citalopram 20 mg q.d., and taking nortriptyline 10 mg QHS. In her last visit we increased her trazodone to 100 mg QHS.  Since increasing her trazodone the patient states that her mood has increased, she enjoys going out, and her insomnia has significantly improved. She does voiced concerns about blurry vision. She otherwise feels extremely well.  We discussed that her blurry vision may be related to her ongoing nortriptyline use. We will stop this medication and see if her symptoms resolve. With the patient doing so well she will follow-up in three months.

## 2017-07-08 NOTE — Progress Notes (Signed)
Internal Medicine Clinic Attending  Case discussed with Dr. Helberg at the time of the visit.  We reviewed the resident's history and exam and pertinent patient test results.  I agree with the assessment, diagnosis, and plan of care documented in the resident's note.    

## 2017-07-25 ENCOUNTER — Other Ambulatory Visit: Payer: Self-pay | Admitting: Internal Medicine

## 2017-07-25 DIAGNOSIS — G44229 Chronic tension-type headache, not intractable: Secondary | ICD-10-CM

## 2017-07-25 NOTE — Telephone Encounter (Signed)
Nortriptyline discontinued by MD on 07/08/17 therefore this request will be refused.

## 2017-07-27 ENCOUNTER — Other Ambulatory Visit: Payer: Self-pay | Admitting: Gastroenterology

## 2017-07-27 NOTE — Telephone Encounter (Signed)
Left a message to return call.  

## 2017-07-27 NOTE — Telephone Encounter (Signed)
What is this for?  Is it a refill request?

## 2017-07-28 ENCOUNTER — Telehealth: Payer: Self-pay | Admitting: Internal Medicine

## 2017-07-28 ENCOUNTER — Other Ambulatory Visit: Payer: Self-pay | Admitting: Internal Medicine

## 2017-07-28 DIAGNOSIS — G44229 Chronic tension-type headache, not intractable: Secondary | ICD-10-CM

## 2017-07-28 NOTE — Telephone Encounter (Signed)
Patient called, someone called her pastor wanted to get in touch with her and ask her how the new medicine going, she said that she is taking the new medicine, pls call back

## 2017-07-29 NOTE — Telephone Encounter (Signed)
Called patient. I have sent a message to the front office to schedule her on May 23rd.

## 2017-07-29 NOTE — Telephone Encounter (Signed)
Follow up scheduled for 08-10-2017 with PA Lemmon. She has lots of complaints with her GERD. Refills on reglan are still requested.

## 2017-08-03 ENCOUNTER — Other Ambulatory Visit: Payer: Self-pay | Admitting: Internal Medicine

## 2017-08-03 DIAGNOSIS — J42 Unspecified chronic bronchitis: Secondary | ICD-10-CM

## 2017-08-10 ENCOUNTER — Ambulatory Visit (INDEPENDENT_AMBULATORY_CARE_PROVIDER_SITE_OTHER): Payer: Medicare Other | Admitting: Physician Assistant

## 2017-08-10 ENCOUNTER — Encounter: Payer: Self-pay | Admitting: Physician Assistant

## 2017-08-10 VITALS — BP 130/70 | HR 76 | Ht 60.0 in | Wt 119.0 lb

## 2017-08-10 DIAGNOSIS — Z9889 Other specified postprocedural states: Secondary | ICD-10-CM

## 2017-08-10 DIAGNOSIS — K2100 Gastro-esophageal reflux disease with esophagitis, without bleeding: Secondary | ICD-10-CM

## 2017-08-10 DIAGNOSIS — R112 Nausea with vomiting, unspecified: Secondary | ICD-10-CM

## 2017-08-10 DIAGNOSIS — R1013 Epigastric pain: Secondary | ICD-10-CM | POA: Diagnosis not present

## 2017-08-10 MED ORDER — METOCLOPRAMIDE HCL 5 MG PO TABS: ORAL_TABLET | ORAL | 0 refills | Status: DC

## 2017-08-10 MED ORDER — RANITIDINE HCL 300 MG PO TABS: 300.0000 mg | ORAL_TABLET | Freq: Every day | ORAL | 3 refills | Status: DC

## 2017-08-10 MED ORDER — DEXLANSOPRAZOLE 60 MG PO CPDR: 60.0000 mg | DELAYED_RELEASE_CAPSULE | Freq: Every day | ORAL | 3 refills | Status: DC

## 2017-08-10 MED ORDER — SUCRALFATE 1 GM/10ML PO SUSP: 1.0000 g | Freq: Every evening | ORAL | 0 refills | Status: DC | PRN

## 2017-08-10 NOTE — Addendum Note (Signed)
Addended by: Sharlee BlewBROWN, DESIREE P on: 08/10/2017 04:06 PM   Modules accepted: Orders

## 2017-08-10 NOTE — Progress Notes (Signed)
Chief Complaint: Follow-up for GERD  HPI:    Sherri Mcmillan is an 84 year old African-American female with a past medical history of chronic reflux, who follows with Dr. Myrtie Neither and returns to clinic today for follow-up of her reflux.    05/20/17 office visit Dr. Myrtie Neither.  It was discussed she had long-standing regurgitation with water brash and burning chest pain despite acid suppression, seemed to be an element of visceral hypersensitivity, chronic pain syndrome overlay.  Had a previous fundoplication but still had a hiatal hernia and patulous LES on recent upper endoscopy in August 2018, reflux esophagitis and Barrett's esophagus without dysplasia.  Had reported hematemesis on several occasions.  At that time she was continued on high-dose acid suppression as well as given a trial of Metoclopramide 5 mg nightly as her symptoms are worse at night.    Today, explains that nothing has helped.  This is inhibiting her life.  Continues with regurgitation and water brash as well as burning chest pain despite Omeprazole 40 mg twice daily, Carafate 10 mL's at bedtime as well as Reglan 5 mg at bedtime.  She has altered her diet and lifestyle and still has episodes of nausea and vomiting, sometimes with blood.  Patient is almost tearful today as she is finding no relief from her symptoms.  She is "up half of the night" with her symptoms.  Patient is willing to try anything that might help.    Denies fever, chills, weight loss or anorexia.  Past Medical History:  Diagnosis Date  . Anemia 12/14/2012  . Aortic stenosis    Dx ECHO 2008 , mild and aymptomatic , needs ECHO q 3-5 yrs  . Barrett's esophagus    Demonstrated on EGD 12/2010. EGD 09/17/2012 shows inflamed GE junctional mucosa without metaplasia, dysplasia, or malignancy.  . Bronchitis    hx of  . Cataract   . Chronic anxiety    Admission to [facility] (90s or early 2000)  for 6 weeks after mother died. Complicated again by death of her sister 2010. 2012 developed  hallucinations and I refused to refill controlled meds unless she see psych which she is not agreable to  . Chronic insomnia   . Chronic pain    DJD knees, back, migranes, LLE varicose veins, and LLE neuropathy.   . Depression   . DVT (deep venous thrombosis) (HCC)   . Elevated cholesterol 10/11   LDL 155. Her 10 year risk (decreasing her age to 77 as the calculator won't go to age 32) is 21% ish. If consider her non smoker (which I wouldn't even though she says 1 pak lasts 2 weeks) risk is 12% ish. So goal for LDL is 130 or 100.  Marland Kitchen Gastroparesis    Demonstrated on GES 12/2010 by Dr Dalene Seltzer  . GERD (gastroesophageal reflux disease)   . H/O hiatal hernia   . HTN (hypertension)    Controlled with 2 drug therapy  . Left leg DVT (HCC) 09/28/2012  . Migraine    sometimes    Past Surgical History:  Procedure Laterality Date  . ABDOMINAL HYSTERECTOMY    . APPENDECTOMY    . CATARACT EXTRACTION     left eye  . COLONOSCOPY  2000&2005   Dr.Magod  . DIRECT LARYNGOSCOPY   September 2008    preoperative diagnosis hoarseness with anterior right vocal cord lesion -  direct laryngoscopy and excisional biopsy of right anterior vocal cord lesion done by Dr. Ezzard Standing  . ENDOVENOUS ABLATION SAPHENOUS VEIN W/ LASER  Left 05-09-2014   EVLA left small saphenous vein by Gretta Beganodd Early MD  . ESOPHAGOGASTRODUODENOSCOPY  11/2010  . ESOPHAGOGASTRODUODENOSCOPY N/A 09/17/2012   Procedure: ESOPHAGOGASTRODUODENOSCOPY (EGD);  Surgeon: Hart Carwinora M Brodie, MD;  [facility];  Service: Endoscopy;  Laterality: N/A;  . ESOPHAGOGASTRODUODENOSCOPY N/A 12/17/2012   Procedure: ESOPHAGOGASTRODUODENOSCOPY (EGD);  Surgeon: Beverley FiedlerJay M Pyrtle, MD;  [facility];  Service: Gastroenterology;  Laterality: N/A;  . EXCISION MASS HEAD N/A 03/05/2016   Procedure: EXCISION POSTERIOR SCALP MASS;  Surgeon: Axel FillerArmando Ramirez, MD;  Location: [facility] OR;  Service: General;  Laterality: N/A;  . EYE SURGERY    . HEMORRHOID SURGERY    . KNEE  ARTHROSCOPY    . LAPAROSCOPIC NISSEN FUNDOPLICATION N/A 05/02/2013   Procedure: LAPAROSCOPIC NISSEN FUNDOPLICATION;  Surgeon: Valarie MerinoMatthew B Martin, MD;  Location: [facility] ORS;  Service: General;  Laterality: N/A;  . MENISCECTOMY   July 2002    preoperative diagnosis torn medial meniscus right knee, partial medial meniscectomy, debridement chondroplasty patellofemoral joint, done by Dr. Madelon Lipsaffrey  . POLYPECTOMY  2000   Dr.Magod  . ROTATOR CUFF REPAIR  09/21/2011   rt shoulder    Current Outpatient Medications  Medication Sig Dispense Refill  . albuterol (PROVENTIL HFA;VENTOLIN HFA) 108 (90 Base) MCG/ACT inhaler INHALE 2 PUFFS INTO THE LUNGS EVERY 4 (FOUR) HOURS AS NEEDED FOR WHEEZING OR SHORTNESS OF BREATH. 8.5 Inhaler 0  . amLODipine (NORVASC) 10 MG tablet Take 1 tablet (10 mg total) by mouth daily. 90 tablet 0  . escitalopram (LEXAPRO) 20 MG tablet TAKE 1 TABLET BY MOUTH EVERY DAY 90 tablet 0  . gabapentin (NEURONTIN) 600 MG tablet Take 1 tablet (600 mg total) by mouth at bedtime. 30 tablet 1  . metoCLOPramide (REGLAN) 5 MG tablet TAKE 1 TABLET BY MOUTH EVERYDAY AT BEDTIME 30 tablet 0  . omeprazole (PRILOSEC) 40 MG capsule Take 1 capsule (40 mg total) by mouth 2 (two) times daily. Take 30 minutes before meals 60 capsule 2  . sucralfate (CARAFATE) 1 GM/10ML suspension Take 10 mLs (1 g total) by mouth at bedtime as needed. 420 mL 0  . tiZANidine (ZANAFLEX) 2 MG tablet TAKE 1 TABLET (2 MG TOTAL) BY MOUTH AT BEDTIME AS NEEDED FOR MUSCLE SPASMS. 20 tablet 0  . traZODone (DESYREL) 100 MG tablet Take 1 tablet (100 mg total) by mouth at bedtime. 90 tablet 0   No current facility-administered medications for this visit.     Allergies as of 08/10/2017 - Review Complete 08/10/2017  Allergen Reaction Noted  . Aspirin Rash 07/06/2006    Family History  Problem Relation Age of Onset  . Heart disease Mother   . Hypertension Mother   . Heart attack Mother   . Hypertension Father   . Heart attack Father   .  Diabetes Brother   . Stroke Neg Hx   . Cancer Neg Hx   . Colon cancer Neg Hx   . Anesthesia problems Neg Hx   . Hypotension Neg Hx   . Malignant hyperthermia Neg Hx   . Pseudochol deficiency Neg Hx   . Stomach cancer Neg Hx   . Rectal cancer Neg Hx   . Esophageal cancer Neg Hx     Social History   Socioeconomic History  . Marital status: Single    Spouse name: Not on file  . Number of children: 0  . Years of education: some college  . Highest education level: Not on file  Occupational History  . Occupation: disabled  Social Needs  .  Financial resource strain: Not on file  . Food insecurity:    Worry: Not on file    Inability: Not on file  . Transportation needs:    Medical: Not on file    Non-medical: Not on file  Tobacco Use  . Smoking status: Former Smoker    Years: 50.00    Types: Cigarettes    Last attempt to quit: 06/01/2010    Years since quitting: 7.1  . Smokeless tobacco: Never Used  Substance and Sexual Activity  . Alcohol use: No    Alcohol/week: 0.0 oz    Comment: quit 24 years ago  . Drug use: No  . Sexual activity: Not on file  Lifestyle  . Physical activity:    Days per week: Not on file    Minutes per session: Not on file  . Stress: Not on file  Relationships  . Social connections:    Talks on phone: Not on file    Gets together: Not on file    Attends religious service: Not on file    Active member of club or organization: Not on file    Attends meetings of clubs or organizations: Not on file    Relationship status: Not on file  . Intimate partner violence:    Fear of current or ex partner: Not on file    Emotionally abused: Not on file    Physically abused: Not on file    Forced sexual activity: Not on file  Other Topics Concern  . 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     No children.  Education: some college.     Review of Systems:    Constitutional: No weight loss, fever or chills Cardiovascular: No chest pain Respiratory: No SOB  Gastrointestinal: See HPI and otherwise negative   Physical Exam:  Vital signs: BP 130/70   Pulse 76   Ht 5' (1.524 m)   Wt 119 lb (54 kg)   LMP 04/26/1950   BMI 23.24 kg/m   Constitutional:   Pleasant Elderly AA female appears to be in NAD, Well developed, Well nourished, alert and cooperative Respiratory: Respirations even and unlabored. Lungs clear to auscultation bilaterally.   No wheezes, crackles, or rhonchi.  Cardiovascular: Normal S1, S2. No MRG. Regular rate and rhythm. No peripheral edema, cyanosis or pallor.  Gastrointestinal:  Soft, nondistended, Marked epigastric ttp. No rebound or guarding. Normal bowel sounds. No appreciable masses or hepatomegaly. Psychiatric:  Demonstrates good judgement and reason without abnormal affect or behaviors.  No recent labs or imaging.  Assessment: 1.  GERD with esophagitis: Chronic for patient, status post Nissen fundoplication which was intact at time of last EGD 2018, though patient had 5 cm hiatal hernia which is likely contributing to symptoms, also element of anxiety/visceral hypersensitivity overlay 2.  Hiatal hernia 3.  Status post Nissen fundoplication  Plan: 1.  Symptoms continued today.  We will trial a change in medication. 2.  Prescribed Dexilant 60 mg daily #30 with 3 refills.  Discussed she should discontinue Omeprazole while  taking this medication. 3.  Patient to continue Carafate but take this 10 mL's 30 minutes before dinner.  She should wait 2 hours after taking this medication and before going to bed to take her Zantac 300 mg and Metoclopramide 5 mg.  All  of these medications were prescribed today. 4.  Will discuss possible surgical referral for 5 cm hiatal hernia with Dr. Myrtie Neither.  He felt at time of last visit that this may be of uncertain benefit but patient is desperate now. 5.  Patient to follow in clinic with Dr. Myrtie Neither in 1-2 months or sooner if necessary.  Hyacinth Meeker, PA-C [facility] 08/10/2017, 3:55 PM  Cc: Levora Dredge, MD

## 2017-08-10 NOTE — Patient Instructions (Signed)
Stop Omeprazole.   Start Dexilant 60 mg daily.   Take Carafate 1 gram/10 mL before dinner, wait 2 hours to Zantac and Metoclopramide.   Start Zantac 300 mg at bedtime.   Metoclopramide 5 mg at bedtime.

## 2017-08-11 NOTE — Progress Notes (Signed)
Thank you for sending this case to me. I have reviewed the entire note, and the outlined plan seems appropriate.  Stop the metoclopramide if not already done.  I still suspect there is an element of visceral hypersensitivity contributing here.  I am not going to use TCA at her age and with her other medicines.  Please arrange esophageal pH and impedance testing along with esophageal manometry. It is difficult to know whether she would be considered a candidate for re-do surgery, or how much it would help.  This testing may help us determine that.  Amada JupiterHenry Danis, MD

## 2017-08-15 ENCOUNTER — Telehealth: Payer: Self-pay

## 2017-08-15 DIAGNOSIS — K2100 Gastro-esophageal reflux disease with esophagitis, without bleeding: Secondary | ICD-10-CM

## 2017-08-15 NOTE — Telephone Encounter (Signed)
Unk LightningLemmon, Jennifer Lynne, PA (Physician Assistant)       Show:Clear all [x] Manual[] Template[] Copied  Added by: [x] Unk LightningLemmon, Jennifer Lynne, PA   [] Hover for details   Please call patient and have her stop Metoclopramide. Also per Dr. Myrtie Neitheranis he would like her set up for esophageal pH and impendence testing along with esophageal manometry. Thanks-JLL

## 2017-08-15 NOTE — Telephone Encounter (Signed)
The pt has been advised of the recommendation to stop metoclopramide and have esophageal impedence, Ph and manometry study.  I will call her tomorrow with the appt details she is currently out of town.

## 2017-08-15 NOTE — Progress Notes (Signed)
Please call patient and have her stop Metoclopramide. Also per Dr. Myrtie Neitheranis he would like her set up for esophageal pH and impendence testing along with esophageal manometry. Thanks-JLL

## 2017-08-17 NOTE — Telephone Encounter (Signed)
I spoke with the pt's caregiver and advised him of the appt date time and instructions.  He will call back with any further questions or concerns.     You have been scheduled for an esophageal manometry at [facility] on 08/24/17 at 12:30 pm. Please arrive 30 minutes prior to your procedure for registration. You will need to go to outpatient registration (1st floor of the hospital) first. Make certain to bring your insurance cards as well as a complete list of medications.  Please remember the following:  1) Do not take any muscle relaxants, xanax (alprazolam) or ativan for 1 day prior to your test as well as the day of the test.  2) Nothing to eat or drink after 12:00 midnight on the night before your test.  3) Hold all diabetic medications/insulin the morning of the test. You may eat and take your medications after the test.  It will take at least 2 weeks to receive the results of this test from your physician.  ------------------------------------------ ABOUT ESOPHAGEAL MANOMETRY Esophageal manometry (muh-NOM-uh-tree) is a test that gauges how well your esophagus works. Your esophagus is the long, muscular tube that connects your throat to your stomach. Esophageal manometry measures the rhythmic muscle contractions (peristalsis) that occur in your esophagus when you swallow. Esophageal manometry also measures the coordination and force exerted by the muscles of your esophagus.  During esophageal manometry, a thin, flexible tube (catheter) that contains sensors is passed through your nose, down your esophagus and into your stomach. Esophageal manometry can be helpful in diagnosing some mostly uncommon disorders that affect your esophagus.  Why it's done Esophageal manometry is used to evaluate the movement (motility) of food through the esophagus and into the stomach. The test measures how well the circular bands of muscle (sphincters) at the top and bottom of your esophagus open and  close, as well as the pressure, strength and pattern of the wave of esophageal muscle contractions that moves food along.  What you can expect Esophageal manometry is an outpatient procedure done without sedation. Most people tolerate it well. You may be asked to change into a hospital gown before the test starts.  During esophageal manometry  While you are sitting up, a member of your health care team sprays your throat with a numbing medication or puts numbing gel in your nose or both.  A catheter is guided through your nose into your esophagus. The catheter may be sheathed in a water-filled sleeve. It doesn't interfere with your breathing. However, your eyes may water, and you may gag. You may have a slight nosebleed from irritation.  After the catheter is in place, you may be asked to lie on your back on an exam table, or you may be asked to remain seated.  You then swallow small sips of water. As you do, a computer connected to the catheter records the pressure, strength and pattern of your esophageal muscle contractions.  During the test, you'll be asked to breathe slowly and smoothly, remain as still as possible, and swallow only when you're asked to do so.  A member of your health care team may move the catheter down into your stomach while the catheter continues its measurements.  The catheter then is slowly withdrawn. The test usually lasts 20 to 30 minutes.  After esophageal manometry  When your esophageal manometry is complete, you may return to your normal activities  This test typically takes 30-45 minutes to complete. _________________________________________________

## 2017-08-23 NOTE — Progress Notes (Signed)
Unable to contact patient to remind about esophageal manometry.

## 2017-08-24 ENCOUNTER — Ambulatory Visit (HOSPITAL_COMMUNITY)
Admission: RE | Admit: 2017-08-24 | Discharge: 2017-08-24 | Disposition: A | Discharge status: Home / Self Care | Payer: Medicare Other | Source: Ambulatory Visit | Attending: Gastroenterology | Admitting: Gastroenterology

## 2017-08-24 ENCOUNTER — Encounter (HOSPITAL_COMMUNITY)
Admission: RE | Disposition: A | Discharge status: Home / Self Care | Payer: Self-pay | Source: Ambulatory Visit | Attending: Gastroenterology

## 2017-08-24 DIAGNOSIS — K449 Diaphragmatic hernia without obstruction or gangrene: Secondary | ICD-10-CM | POA: Diagnosis not present

## 2017-08-24 DIAGNOSIS — K219 Gastro-esophageal reflux disease without esophagitis: Secondary | ICD-10-CM

## 2017-08-24 DIAGNOSIS — R12 Heartburn: Secondary | ICD-10-CM | POA: Insufficient documentation

## 2017-08-24 HISTORY — PX: 24 HOUR PH STUDY: SHX5419

## 2017-08-24 HISTORY — PX: ESOPHAGEAL MANOMETRY: SHX5429

## 2017-08-24 SURGERY — MANOMETRY, ESOPHAGUS
Anesthesia: Choice

## 2017-08-24 MED ORDER — LIDOCAINE VISCOUS 2 % MT SOLN
OROMUCOSAL | Status: AC
  Filled 2017-08-24: qty 15

## 2017-08-24 SURGICAL SUPPLY — 2 items
FACESHIELD LNG OPTICON STERILE (SAFETY) IMPLANT
GLOVE BIO SURGEON STRL SZ8 (GLOVE) ×6 IMPLANT

## 2017-08-24 NOTE — Progress Notes (Signed)
Esophageal manometry done per protocol. Pt tolerated well without distress. Probe removed. No complications noted. Ph probe place at 32.5cm per manometry reading without trouble. Taped to nare securely. Went over teaching of study and monitor with patient and patients caregiver and pastor using teach back. Questions answered. Pt to return to unit to have probe removed tomorrow at 1315 or after and to have monitor downloaded. Reports then to be sent to Dr. Myrtie Neither' office.

## 2017-08-26 ENCOUNTER — Encounter (HOSPITAL_COMMUNITY): Payer: Self-pay | Admitting: Gastroenterology

## 2017-09-01 ENCOUNTER — Other Ambulatory Visit: Payer: Self-pay | Admitting: Internal Medicine

## 2017-09-01 DIAGNOSIS — J42 Unspecified chronic bronchitis: Secondary | ICD-10-CM

## 2017-09-06 ENCOUNTER — Telehealth: Payer: Self-pay | Admitting: *Deleted

## 2017-09-06 NOTE — Telephone Encounter (Signed)
Patient called yesterday c/o cough X 1 month. Attempted to reach patient at 2 different numbers. No answer. Left VM message requesting return call. Will need to schedule in [facility]. Kinnie Feil, RN, BSN

## 2017-09-07 NOTE — Telephone Encounter (Signed)
Appt with PCP tomorrow 09/08/2017 at 1345. Kinnie Feil, RN, BSN

## 2017-09-08 ENCOUNTER — Encounter: Payer: Self-pay | Admitting: Internal Medicine

## 2017-09-08 ENCOUNTER — Ambulatory Visit (INDEPENDENT_AMBULATORY_CARE_PROVIDER_SITE_OTHER): Payer: Medicare Other | Admitting: Internal Medicine

## 2017-09-08 ENCOUNTER — Other Ambulatory Visit: Payer: Self-pay

## 2017-09-08 VITALS — BP 126/80 | HR 70 | Temp 99.0°F | Ht 60.0 in | Wt 117.8 lb

## 2017-09-08 DIAGNOSIS — K2100 Gastro-esophageal reflux disease with esophagitis, without bleeding: Secondary | ICD-10-CM

## 2017-09-08 DIAGNOSIS — G4700 Insomnia, unspecified: Secondary | ICD-10-CM | POA: Diagnosis not present

## 2017-09-08 DIAGNOSIS — F329 Major depressive disorder, single episode, unspecified: Secondary | ICD-10-CM

## 2017-09-08 DIAGNOSIS — Z79899 Other long term (current) drug therapy: Secondary | ICD-10-CM | POA: Diagnosis not present

## 2017-09-08 DIAGNOSIS — F419 Anxiety disorder, unspecified: Secondary | ICD-10-CM

## 2017-09-08 MED ORDER — SERTRALINE HCL 50 MG PO TABS: 50.0000 mg | ORAL_TABLET | Freq: Every day | ORAL | 1 refills | Status: DC

## 2017-09-08 NOTE — Patient Instructions (Addendum)
Thank you for allowing [place] to provide your care. Today we made the following changes:  1. START Dexlansoprazole (Dexilant) 60 mg Daily.   2. STOP Omeprazole   3. Continue the Carafate, Ranitidine, and Metoclopramide as prescribed.   4. START Zoloft 50 mg daily  Please come back in 1 months for continued care.

## 2017-09-08 NOTE — Assessment & Plan Note (Signed)
Patient presented for continued evaluation and management of her anxiety / depression.   She states that over the last several weeks her symptoms have gotten worse. She consisently feels anxious and is very upset by her current medical situations. She endorses lack of energy, depression, difficulty concentrating, and insomnia. She has not been taking her Trazodone and Escitalopram for several weeks because she feels they are ineffective.   Plan: 1. START Zoloft 50 mg QD 2. STOP Trazodone 100 mg QD and Escitalopram 20 mg QD 3. Follow-up in 4 weeks.

## 2017-09-08 NOTE — Progress Notes (Signed)
   CC: Anxiety   HPI:  [name] is a 84 y.o. female who presented to the clinic for continued evaluation and management for her chronic medical illnesses. For a detailed assessment and plan please refer to problem based charting below.   Past Medical History:  Diagnosis Date  . Anemia 12/14/2012  . Aortic stenosis    Dx ECHO 2008 , mild and aymptomatic , needs ECHO q 3-5 yrs  . Barrett's esophagus    Demonstrated on EGD 12/2010. EGD 09/17/2012 shows inflamed GE junctional mucosa without metaplasia, dysplasia, or malignancy.  . Bronchitis    hx of  . Cataract   . Chronic anxiety    Admission to [facility] (90s or early 2000)  for 6 weeks after mother died. Complicated again by death of her sister 2010. 2012 developed hallucinations and I refused to refill controlled meds unless she see psych which she is not agreable to  . Chronic insomnia   . Chronic pain    DJD knees, back, migranes, LLE varicose veins, and LLE neuropathy.   . Depression   . DVT (deep venous thrombosis) (HCC)   . Elevated cholesterol 10/11   LDL 155. Her 10 year risk (decreasing her age to 10 as the calculator won't go to age 22) is 21% ish. If consider her non smoker (which I wouldn't even though she says 1 pak lasts 2 weeks) risk is 12% ish. So goal for LDL is 130 or 100.  Marland Kitchen Gastroparesis    Demonstrated on GES 12/2010 by Dr Dalene Seltzer  . GERD (gastroesophageal reflux disease)   . H/O hiatal hernia   . HTN (hypertension)    Controlled with 2 drug therapy  . Left leg DVT (HCC) 09/28/2012  . Migraine    sometimes   Review of Systems:   Endorses changes in voice, anxiety, N/V Denies CP, SOB  Physical Exam: Vitals:   09/08/17 1338  BP: 126/80  Pulse: 70  Temp: 99 F (37.2 C)  TempSrc: Oral  SpO2: 100%  Weight: 117 lb 12.8 oz (53.4 kg)  Height: 5' (1.524 m)   General: Well nourished female in no acute distress Pulm: Good air movement with no wheezing or crackles  CV: RRR, no murmurs, no rubs  Abdomen:  Active bowel sounds, soft, non-distended, no tenderness to palpation   Assessment & Plan:   See Encounters Tab for problem based charting.  Patient discussed with Dr. Cleda Daub

## 2017-09-08 NOTE — Assessment & Plan Note (Signed)
The patient presented today for continued evalaution and management for her GERD. Appreciate the coordinated care with GI.   She was recently evaluated by GI and her medications were changed to the following; Dexlansoprazole, ranitidine, metoclopramide, and carafate. She also had manometry performed. We reviewed the results of her manometry together. She voices that she has NOT been taking her medications as prescribed because she feels they do not work. She is reluctant to do any more testing. She continues to experience emesis, changes in her voice, and coughing. Her symptoms are worse at night.   We discussed that it is important to take her medication consistently and we will continue close follow-up to see if we can better control her symptoms.   Plan: 1. START Dexlansoprazole (Dexilant) 60 mg Daily.  2. STOP Omeprazole  3. Continue the Carafate, Ranitidine, and Metoclopramide as prescribed.

## 2017-09-12 NOTE — Progress Notes (Signed)
Internal Medicine Clinic Attending  Case discussed with Dr. Helberg at the time of the visit.  We reviewed the resident's history and exam and pertinent patient test results.  I agree with the assessment, diagnosis, and plan of care documented in the resident's note.    

## 2017-09-13 ENCOUNTER — Other Ambulatory Visit: Payer: Self-pay

## 2017-09-14 ENCOUNTER — Other Ambulatory Visit: Payer: Self-pay | Admitting: Internal Medicine

## 2017-09-14 ENCOUNTER — Ambulatory Visit: Payer: Medicare Other | Admitting: Gastroenterology

## 2017-09-14 ENCOUNTER — Telehealth: Payer: Self-pay | Admitting: Gastroenterology

## 2017-09-14 DIAGNOSIS — F419 Anxiety disorder, unspecified: Principal | ICD-10-CM

## 2017-09-14 DIAGNOSIS — G4700 Insomnia, unspecified: Secondary | ICD-10-CM

## 2017-09-14 DIAGNOSIS — F329 Major depressive disorder, single episode, unspecified: Secondary | ICD-10-CM

## 2017-09-14 NOTE — Telephone Encounter (Signed)
No-show for clinic appointment. I had hope to speak with her about test results and plans.

## 2017-09-14 NOTE — Telephone Encounter (Signed)
I have tried several times to reach this patient, I finally mailed her a letter with results and asked her to contact the office.

## 2017-09-15 ENCOUNTER — Ambulatory Visit: Payer: Medicare Other | Admitting: Internal Medicine

## 2017-09-15 NOTE — Telephone Encounter (Signed)
Please let me know when we hear from her.  I rec'd the pH study from Dr. Lavon Paganini and reviewed it.  It confirms significant reflux to warrant a referral to surgery to consider revision of her hiatal hernia repair.

## 2017-09-20 ENCOUNTER — Ambulatory Visit (INDEPENDENT_AMBULATORY_CARE_PROVIDER_SITE_OTHER): Payer: Medicare Other | Admitting: Internal Medicine

## 2017-09-20 ENCOUNTER — Other Ambulatory Visit: Payer: Self-pay

## 2017-09-20 ENCOUNTER — Emergency Department (HOSPITAL_COMMUNITY)
Admission: EM | Admit: 2017-09-20 | Discharge: 2017-09-20 | Disposition: A | Discharge status: Home / Self Care | Payer: Medicare Other | Source: Home / Self Care

## 2017-09-20 ENCOUNTER — Encounter: Payer: Self-pay | Admitting: Internal Medicine

## 2017-09-20 ENCOUNTER — Observation Stay (HOSPITAL_COMMUNITY)
Admission: AD | Admit: 2017-09-20 | Discharge: 2017-09-21 | Disposition: A | Discharge status: Home / Self Care | Payer: Medicare Other | Source: Ambulatory Visit | Attending: Internal Medicine | Admitting: Internal Medicine

## 2017-09-20 ENCOUNTER — Observation Stay (HOSPITAL_COMMUNITY): Payer: Medicare Other

## 2017-09-20 DIAGNOSIS — R251 Tremor, unspecified: Secondary | ICD-10-CM | POA: Diagnosis not present

## 2017-09-20 DIAGNOSIS — F32A Depression, unspecified: Secondary | ICD-10-CM | POA: Diagnosis present

## 2017-09-20 DIAGNOSIS — Z87891 Personal history of nicotine dependence: Secondary | ICD-10-CM | POA: Diagnosis not present

## 2017-09-20 DIAGNOSIS — D509 Iron deficiency anemia, unspecified: Secondary | ICD-10-CM | POA: Diagnosis present

## 2017-09-20 DIAGNOSIS — G9340 Encephalopathy, unspecified: Secondary | ICD-10-CM | POA: Insufficient documentation

## 2017-09-20 DIAGNOSIS — Z86718 Personal history of other venous thrombosis and embolism: Secondary | ICD-10-CM | POA: Diagnosis not present

## 2017-09-20 DIAGNOSIS — Z886 Allergy status to analgesic agent status: Secondary | ICD-10-CM | POA: Diagnosis not present

## 2017-09-20 DIAGNOSIS — R638 Other symptoms and signs concerning food and fluid intake: Secondary | ICD-10-CM

## 2017-09-20 DIAGNOSIS — K449 Diaphragmatic hernia without obstruction or gangrene: Secondary | ICD-10-CM | POA: Diagnosis not present

## 2017-09-20 DIAGNOSIS — Z66 Do not resuscitate: Secondary | ICD-10-CM | POA: Diagnosis not present

## 2017-09-20 DIAGNOSIS — F419 Anxiety disorder, unspecified: Secondary | ICD-10-CM

## 2017-09-20 DIAGNOSIS — R519 Headache, unspecified: Secondary | ICD-10-CM | POA: Diagnosis present

## 2017-09-20 DIAGNOSIS — I10 Essential (primary) hypertension: Secondary | ICD-10-CM | POA: Diagnosis not present

## 2017-09-20 DIAGNOSIS — Z9842 Cataract extraction status, left eye: Secondary | ICD-10-CM | POA: Diagnosis not present

## 2017-09-20 DIAGNOSIS — G43909 Migraine, unspecified, not intractable, without status migrainosus: Secondary | ICD-10-CM

## 2017-09-20 DIAGNOSIS — T50901A Poisoning by unspecified drugs, medicaments and biological substances, accidental (unintentional), initial encounter: Principal | ICD-10-CM | POA: Insufficient documentation

## 2017-09-20 DIAGNOSIS — X58XXXA Exposure to other specified factors, initial encounter: Secondary | ICD-10-CM | POA: Insufficient documentation

## 2017-09-20 DIAGNOSIS — K2270 Barrett's esophagus without dysplasia: Secondary | ICD-10-CM | POA: Insufficient documentation

## 2017-09-20 DIAGNOSIS — G8929 Other chronic pain: Secondary | ICD-10-CM | POA: Insufficient documentation

## 2017-09-20 DIAGNOSIS — I350 Nonrheumatic aortic (valve) stenosis: Secondary | ICD-10-CM | POA: Insufficient documentation

## 2017-09-20 DIAGNOSIS — K219 Gastro-esophageal reflux disease without esophagitis: Secondary | ICD-10-CM | POA: Diagnosis not present

## 2017-09-20 DIAGNOSIS — Z8249 Family history of ischemic heart disease and other diseases of the circulatory system: Secondary | ICD-10-CM | POA: Diagnosis not present

## 2017-09-20 DIAGNOSIS — R440 Auditory hallucinations: Secondary | ICD-10-CM | POA: Diagnosis not present

## 2017-09-20 DIAGNOSIS — R112 Nausea with vomiting, unspecified: Secondary | ICD-10-CM | POA: Diagnosis not present

## 2017-09-20 DIAGNOSIS — R51 Headache: Secondary | ICD-10-CM | POA: Diagnosis not present

## 2017-09-20 DIAGNOSIS — F329 Major depressive disorder, single episode, unspecified: Secondary | ICD-10-CM | POA: Insufficient documentation

## 2017-09-20 DIAGNOSIS — Z79899 Other long term (current) drug therapy: Secondary | ICD-10-CM | POA: Diagnosis not present

## 2017-09-20 LAB — CBC WITH DIFFERENTIAL/PLATELET
Abs Immature Granulocytes: 0 10*3/uL (ref 0.0–0.1)
Basophils Absolute: 0 10*3/uL (ref 0.0–0.1)
Basophils Relative: 0 %
Eosinophils Absolute: 0 10*3/uL (ref 0.0–0.7)
Eosinophils Relative: 0 %
HCT: 29.7 % — ABNORMAL LOW (ref 36.0–46.0)
Hemoglobin: 8.6 g/dL — ABNORMAL LOW (ref 12.0–15.0)
Immature Granulocytes: 0 %
Lymphocytes Relative: 28 %
Lymphs Abs: 1.4 10*3/uL (ref 0.7–4.0)
MCH: 22.2 pg — ABNORMAL LOW (ref 26.0–34.0)
MCHC: 29 g/dL — ABNORMAL LOW (ref 30.0–36.0)
MCV: 76.7 fL — ABNORMAL LOW (ref 78.0–100.0)
Monocytes Absolute: 0.5 10*3/uL (ref 0.1–1.0)
Monocytes Relative: 10 %
Neutro Abs: 3.1 10*3/uL (ref 1.7–7.7)
Neutrophils Relative %: 62 %
Platelets: 152 10*3/uL (ref 150–400)
RBC: 3.87 MIL/uL (ref 3.87–5.11)
RDW: 16.4 % — ABNORMAL HIGH (ref 11.5–15.5)
WBC: 5 10*3/uL (ref 4.0–10.5)

## 2017-09-20 LAB — COMPREHENSIVE METABOLIC PANEL
ALT: 10 U/L — ABNORMAL LOW (ref 14–54)
AST: 17 U/L (ref 15–41)
Albumin: 4 g/dL (ref 3.5–5.0)
Alkaline Phosphatase: 64 U/L (ref 38–126)
Anion gap: 8 (ref 5–15)
BUN: 13 mg/dL (ref 6–20)
CO2: 25 mmol/L (ref 22–32)
Calcium: 9.5 mg/dL (ref 8.9–10.3)
Chloride: 106 mmol/L (ref 101–111)
Creatinine, Ser: 0.81 mg/dL (ref 0.44–1.00)
GFR calc Af Amer: >60 mL/min (ref >60)
GFR calc non Af Amer: >60 mL/min (ref >60)
Glucose, Bld: 109 mg/dL — ABNORMAL HIGH (ref 65–99)
Potassium: 3.4 mmol/L — ABNORMAL LOW (ref 3.5–5.1)
Sodium: 139 mmol/L (ref 135–145)
Total Bilirubin: 0.3 mg/dL (ref 0.3–1.2)
Total Protein: 7.7 g/dL (ref 6.5–8.1)

## 2017-09-20 LAB — PROTIME-INR
INR: 1.03
Prothrombin Time: 13.4 seconds (ref 11.4–15.2)

## 2017-09-20 LAB — ACETAMINOPHEN LEVEL: Acetaminophen (Tylenol), Serum: <10 ug/mL — ABNORMAL LOW (ref 10–30)

## 2017-09-20 LAB — SALICYLATE LEVEL: Salicylate Lvl: <7 mg/dL (ref 2.8–30.0)

## 2017-09-20 MED ORDER — ONDANSETRON HCL 4 MG/2ML IJ SOLN: 4.0000 mg | Freq: Three times a day (TID) | INTRAMUSCULAR | Status: DC | PRN

## 2017-09-20 MED ORDER — GABAPENTIN 600 MG PO TABS
600.0000 mg | ORAL_TABLET | Freq: Every day | ORAL | Status: DC
  Administered 2017-09-20: 600 mg via ORAL
  Filled 2017-09-20: qty 1

## 2017-09-20 MED ORDER — PANTOPRAZOLE SODIUM 40 MG PO TBEC
40.0000 mg | DELAYED_RELEASE_TABLET | Freq: Two times a day (BID) | ORAL | Status: DC
  Administered 2017-09-20 – 2017-09-21 (×2): 40 mg via ORAL
  Filled 2017-09-20 (×3): qty 1

## 2017-09-20 MED ORDER — PANTOPRAZOLE SODIUM 40 MG PO TBEC: 40.0000 mg | DELAYED_RELEASE_TABLET | Freq: Every day | ORAL | Status: DC

## 2017-09-20 MED ORDER — SODIUM CHLORIDE 0.9% FLUSH
3.0000 mL | Freq: Two times a day (BID) | INTRAVENOUS | Status: DC
  Administered 2017-09-20: 3 mL via INTRAVENOUS

## 2017-09-20 MED ORDER — SUCRALFATE 1 GM/10ML PO SUSP: 1.0000 g | Freq: Every evening | ORAL | Status: DC | PRN

## 2017-09-20 MED ORDER — PROCHLORPERAZINE EDISYLATE 10 MG/2ML IJ SOLN
10.0000 mg | Freq: Once | INTRAMUSCULAR | Status: AC
  Administered 2017-09-20: 10 mg via INTRAVENOUS
  Filled 2017-09-20: qty 2

## 2017-09-20 MED ORDER — KETOROLAC TROMETHAMINE 15 MG/ML IJ SOLN
15.0000 mg | Freq: Once | INTRAMUSCULAR | Status: AC
  Administered 2017-09-20: 15 mg via INTRAVENOUS
  Filled 2017-09-20: qty 1

## 2017-09-20 MED ORDER — ACETAMINOPHEN 325 MG PO TABS
650.0000 mg | ORAL_TABLET | Freq: Four times a day (QID) | ORAL | Status: DC | PRN
Start: 2017-09-20 — End: 2017-09-21
  Administered 2017-09-20 – 2017-09-21 (×3): 650 mg via ORAL
  Filled 2017-09-20 (×3): qty 2

## 2017-09-20 MED ORDER — DIPHENHYDRAMINE HCL 25 MG PO CAPS
25.0000 mg | ORAL_CAPSULE | Freq: Once | ORAL | Status: AC
  Administered 2017-09-20: 25 mg via ORAL
  Filled 2017-09-20: qty 1

## 2017-09-20 MED ORDER — SODIUM CHLORIDE 0.9 % IV SOLN
INTRAVENOUS | Status: DC
  Administered 2017-09-20 – 2017-09-21 (×2): via INTRAVENOUS

## 2017-09-20 MED ORDER — ACETAMINOPHEN 650 MG RE SUPP: 650.0000 mg | RECTAL | Status: DC | PRN

## 2017-09-20 MED ORDER — RAMELTEON 8 MG PO TABS
8.0000 mg | ORAL_TABLET | Freq: Every evening | ORAL | Status: DC | PRN
Start: 2017-09-20 — End: 2017-09-21
  Filled 2017-09-20: qty 1

## 2017-09-20 MED ORDER — ENOXAPARIN SODIUM 30 MG/0.3ML ~~LOC~~ SOLN
30.0000 mg | SUBCUTANEOUS | Status: DC
  Administered 2017-09-20: 30 mg via SUBCUTANEOUS
  Filled 2017-09-20: qty 0.3

## 2017-09-20 MED ORDER — FAMOTIDINE 20 MG PO TABS
20.0000 mg | ORAL_TABLET | Freq: Every day | ORAL | Status: DC
  Administered 2017-09-20: 20 mg via ORAL
  Filled 2017-09-20: qty 1

## 2017-09-20 MED ORDER — AMLODIPINE BESYLATE 10 MG PO TABS
10.0000 mg | ORAL_TABLET | Freq: Every day | ORAL | Status: DC
  Administered 2017-09-21: 10 mg via ORAL
  Filled 2017-09-20: qty 1

## 2017-09-20 NOTE — ED Notes (Signed)
Patient called x 2 in all areas of lobby.  No response.

## 2017-09-20 NOTE — H&P (Addendum)
Date: 09/20/2017               Patient Name:  Sherri Mcmillan MRN: [id number]  DOB: 06-06-1932 Age / Sex: 84 y.o., female   PCP: Sherri Dredge, MD              Medical Service: Internal Medicine Teaching Service              Attending Physician: Dr. Sandre Mcmillan, Sherri Mocha, MD    First Contact: Sherri Philips, MS 4 Pager: [phone]  Second Contact: Dr. Nelson Mcmillan Pager: [phone]            After Hours (After 5p/  First Contact Pager: [phone]  weekends / holidays): Second Contact Pager: [phone]   Chief Complaint: "It feels like someone is hammering on my head"  History of Present Illness: Sherri Mcmillan is a 84 y.o. female with PMH significant for anxiety, refractory GERD with Barrett's esophagus s/p Nissen fundoplication, gastroparesis, and HTN presenting 2 days after an overdose of one of her prescription medications. History was obtained from Sherri patient and her daughter.  She had been taking trazodone 100 mg QHS and escitalopram 20 mg QD for anxiety and depression. She stopped taking them approximately 1 month ago because she felt they were ineffective. She was started on sertraline on 09/08/17.  On Friday, 5/24, she Mcmillan to have a severe headache. She had never had anything like that happen before. It felt "like someone was hammering on my head." On Saturday, her symptoms acutely worsened, and she Mcmillan to feel dizzy. She was also experiencing severe dyspepsia at this time, which she reports is chronic and recurrent due to her GERD. She was at a Church function, and had to go sit in Sherri car. By Sunday morning, she was feeling well enough to go to [place].  Sunday (5/26) afternoon, she went to Sherri refrigerator to get something to eat, and experienced a command auditory hallucination to clean out her refrigerator, which she reports that she did. Her headache suddenly worsened. "Sherri Mcmillan" (presumably her command auditory hallucination) then instructed her to take pills. She is not sure which ones  they were or how many they took, but thinks that they might have been trazodone. She slept from Sunday afternoon until Monday afternoon.  She awoke Monday afternoon with severe dyspepsia, burning chest pain, and a recurrent headache. She vomited. She experienced recurrent auditory hallucinations, this time "Sherri Mcmillan screaming at me that I had tried to kill myself." She has not been able to retain any food or water since then.  She presented to clinic on Tuesday, 5/28. She was then directly admitted to observation due to medication overdose of uncertain type and lack of oral intake for 2 days.  On presentation, she continues to feel nervous, but is reassured. She continues to complain of headache, burning chest and throat pain, and nausea. Her last bowel movement was this morning.  Meds: Current Facility-Administered Medications  Medication Dose Route Frequency Provider Last Rate Last Dose  . 0.9 %  sodium chloride infusion   Intravenous Continuous Sherri Courser, MD      . Melene Muller ON 09/21/2017] amLODipine (NORVASC) tablet 10 mg  10 mg Oral Daily Sherri Courser, MD      . enoxaparin (LOVENOX) injection 30 mg  30 mg Subcutaneous Q24H , Sherri Neat, MD      . gabapentin (NEURONTIN) tablet 600 mg  600 mg Oral QHS Sherri Courser, MD      . Melene Muller ON 09/21/2017] pantoprazole (  PROTONIX) EC tablet 40 mg  40 mg Oral Daily , Sherri Neat, MD      . sodium chloride flush (NS) 0.9 % injection 3 mL  3 mL Intravenous Q12H Sherri Courser, MD        Allergies: Allergies as of 09/20/2017 - Review Complete 09/20/2017  Allergen Reaction Noted  . Aspirin Rash 07/06/2006   Past Medical History:  Diagnosis Date  . Anemia 12/14/2012  . Aortic stenosis    Dx ECHO 2008 , mild and aymptomatic , needs ECHO q 3-5 yrs  . Barrett's esophagus    Demonstrated on EGD 12/2010. EGD 09/17/2012 shows inflamed GE junctional mucosa without metaplasia, dysplasia, or malignancy.  . Bronchitis    hx of  . Cataract   . Chronic anxiety      Admission to [facility] (90s or early 2000)  for 6 weeks after mother died. Complicated again by death of her sister 2010. 2012 developed hallucinations and I refused to refill controlled meds unless she see psych which she is not agreable to  . Chronic insomnia   . Chronic pain    DJD knees, back, migranes, LLE varicose veins, and LLE neuropathy.   . Depression   . DVT (deep venous thrombosis) (HCC)   . Elevated cholesterol 10/11   LDL 155. Her 10 year risk (decreasing her age to 63 as Sherri calculator won't go to age 70) is 21% ish. If consider her non smoker (which I wouldn't even though she says 1 pak lasts 2 weeks) risk is 12% ish. So goal for LDL is 130 or 100.  Marland Kitchen Gastroparesis    Demonstrated on GES 12/2010 by Dr Sherri Mcmillan  . GERD (gastroesophageal reflux disease)   . H/O hiatal hernia   . HTN (hypertension)    Controlled with 2 drug therapy  . Left leg DVT (HCC) 09/28/2012  . Migraine    sometimes   Past Surgical History:  Procedure Laterality Date  . 24 HOUR PH STUDY N/A 08/24/2017   Procedure: 24 HOUR PH STUDY IMPEDANCE;  Surgeon: Sherri Rist, MD;  Location: [facility] ENDOSCOPY;  Service: Gastroenterology;  Laterality: N/A;  . ABDOMINAL HYSTERECTOMY    . APPENDECTOMY    . CATARACT EXTRACTION     left eye  . COLONOSCOPY  2000&2005   SherriMagod  . DIRECT LARYNGOSCOPY   September 2008    preoperative diagnosis hoarseness with anterior right vocal cord lesion -  direct laryngoscopy and excisional biopsy of right anterior vocal cord lesion done by Dr. Ezzard Mcmillan  . ENDOVENOUS ABLATION SAPHENOUS VEIN W/ LASER Left 05-09-2014   EVLA left small saphenous vein by Sherri Began MD  . ESOPHAGEAL MANOMETRY N/A 08/24/2017   Procedure: ESOPHAGEAL MANOMETRY (EM);  Surgeon: Sherri Rist, MD;  Location: [facility] ENDOSCOPY;  Service: Gastroenterology;  Laterality: N/A;  . ESOPHAGOGASTRODUODENOSCOPY  11/2010  . ESOPHAGOGASTRODUODENOSCOPY N/A 09/17/2012   Procedure: ESOPHAGOGASTRODUODENOSCOPY (EGD);  Surgeon: Sherri Carwin, MD;  Location: [facility] ENDOSCOPY;  Service: Endoscopy;  Laterality: N/A;  . ESOPHAGOGASTRODUODENOSCOPY N/A 12/17/2012   Procedure: ESOPHAGOGASTRODUODENOSCOPY (EGD);  Surgeon: Beverley Fiedler, MD;  Location: [facility] ENDOSCOPY;  Service: Gastroenterology;  Laterality: N/A;  . EXCISION MASS HEAD N/A 03/05/2016   Procedure: EXCISION POSTERIOR SCALP MASS;  Surgeon: Axel Filler, MD;  Location: [facility] OR;  Service: General;  Laterality: N/A;  . EYE SURGERY    . HEMORRHOID SURGERY    . KNEE ARTHROSCOPY    . LAPAROSCOPIC NISSEN FUNDOPLICATION N/A 05/02/2013   Procedure: LAPAROSCOPIC NISSEN FUNDOPLICATION;  Surgeon: Valarie Merino, MD;  Location: [facility] ORS;  Service: General;  Laterality: N/A;  . MENISCECTOMY   July 2002    preoperative diagnosis torn medial meniscus right knee, partial medial meniscectomy, debridement chondroplasty patellofemoral joint, done by Dr. Madelon Lips  . POLYPECTOMY  2000   SherriMagod  . ROTATOR CUFF REPAIR  09/21/2011   rt shoulder   Family History  Problem Relation Age of Onset  . Heart disease Mother   . Hypertension Mother   . Heart attack Mother   . Hypertension Father   . Heart attack Father   . Diabetes Brother   . Stroke Neg Hx   . Cancer Neg Hx   . Colon cancer Neg Hx   . Anesthesia problems Neg Hx   . Hypotension Neg Hx   . Malignant hyperthermia Neg Hx   . Pseudochol deficiency Neg Hx   . Stomach cancer Neg Hx   . Rectal cancer Neg Hx   . Esophageal cancer Neg Hx    Social History   Socioeconomic History  . Marital status: Single    Spouse name: Not on file  . Number of children: 0  . Years of education: some college  . Highest education level: Not on file  Occupational History  . Occupation: disabled  Social Needs  . Financial resource strain: Not on file  . Food insecurity:    Worry: Not on file    Inability: Not on file  . Transportation needs:    Medical: Not on file    Non-medical: Not on file  Tobacco Use  . Smoking status: Former Smoker     Years: 50.00    Types: Cigarettes    Last attempt to quit: 06/01/2010    Years since quitting: 7.3  . Smokeless tobacco: Never Used  Substance and Sexual Activity  . Alcohol use: No    Alcohol/week: 0.0 oz    Comment: quit 24 years ago  . Drug use: No  . Sexual activity: Not on file  Lifestyle  . Physical activity:    Days per week: Not on file    Minutes per session: Not on file  . Stress: Not on file  Relationships  . Social connections:    Talks on phone: Not on file    Gets together: Not on file    Attends religious service: Not on file    Active member of club or organization: Not on file    Attends meetings of clubs or organizations: Not on file    Relationship status: Not on file  . Intimate partner violence:    Fear of current or ex partner: Not on file    Emotionally abused: Not on file    Physically abused: Not on file    Forced sexual activity: Not on file  Other Topics Concern  . 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     No children.  Education: some college.     Review of Systems: Pertinent items noted in HPI and remainder of comprehensive ROS otherwise  negative. left foot pain from prior fall  Physical Exam: Blood pressure (!) 160/78, pulse (!) 56, temperature 98.5 F (36.9 C), temperature source Oral, resp. rate 17, height 5' (1.524 m), weight 51.4 kg (113 lb 5.1 oz), last menstrual period 04/26/1950, SpO2 100 %. BP (!) 160/78 (BP Location: Right Arm)   Pulse (!) 56   Temp 98.5 F (36.9 C) (Oral)   Resp 17   Ht 5' (1.524 m)   Wt 51.4 kg (113 lb 5.1 oz)   LMP 04/26/1950   SpO2 100%   BMI  22.13 kg/m   General Appearance:    Anxious-appearing woman lying in bed. Appears younger than stated age  Head:    Normocephalic, without obvious abnormality, atraumatic  Eyes:    Pupils ~82mm bilaterally and poorly reactive to light. Conjunctiva/corneas clear, EOM's intact     Nose:   Nares normal, septum midline, mucosa normal, no drainage   Throat:   Lips, mucosa, and tongue normal; poor dentition     Back:     Symmetric, no curvature, ROM normal, no CVA tenderness  Lungs:     Few faint expiratory wheezes but otherwise clear to auscultation bilaterally, respirations unlabored. No w/r/r  Chest Wall:    No tenderness or deformity   Heart:    Regular rate and rhythm, S1 and S2 normal, no rub or gallop. 2/6 systolic crescendo-decrescendo murmur best heard at right upper sternal border     Abdomen:     Soft, moderate TTP in epigastrium that patient reports is chronic. bowel sounds hypoactive all four quadrants, no masses, no organomegaly        Extremities:   Extremities normal, atraumatic, no cyanosis or edema  Pulses:   LE pulses not palpable but feet are warm and well perfused  Skin:   Skin color, texture, turgor normal, no rashes or lesions  Psych:   Conversational, but mental status appears mildly altered. Orientation not formally assessed but grossly oriented. Becomes tearful when discussing command auditory hallucinations. Otherwise mood is anxious and affect is congruent.  Neurologic:   CNII-XII intact, normal strength, sensation and reflexes    throughout    Lab results: No results found for this or any previous visit (from Sherri past 2160 hour(s)).  Imaging results:  No results found.  Other results: EKG: pending.  Assessment & Plan by Problem: Active Problems:   Anxiety and depression   Acute encephalopathy   Drug overdose  Medication overdose 2/2 command auditory hallucination to take numerous pills. Unclear which drug she took, but likely trazodone, possibly  sertraline. Reflexes 2+ throughout, suggesting that serotonin syndrome is unlikely. Pupils are 6mm and poorly reactive, but remainder of neurological exam is normal. - F/u acetaminophen and salicylate levels - F/u ECG - Hold QT prolonging meds until ECG shows QTc - F/u INR - Consult psychiatry tomorrow (5/29) for formal evaluation of auditory hallucinations  Headache Hx of migraines without aura at baseline. Will obtain head CT due to new-onset vomiting in Sherri setting of acute cephalgia. - F/u head CT  GERD with Barrett's s/p Nissen fundoplication Takes dexlansoprasole, sucralfate, ranitidine, and metoclopramide at home. - Pantoprazole 40 mg BID - Famotidine 20 mg QHS - Sucralfate 1 gm QHS PRN  HTN - Home amlodipine  GI ppx: See GERD above VTE ppx: Lovenox Dispo: Admit to obs. Possible discharge 5/28 Code status:   Code Status: DNR   This is a Psychologist, occupational Note.  Sherri care of Sherri patient was discussed with Dr.   and Sherri assessment and plan was formulated with their assistance.  Please see their note for official documentation of Sherri patient encounter.   Signed: Novella Rob, Medical Student 09/20/2017, 6:07 PM   Attestation for Student Documentation:  I personally was present and performed or re-performed Sherri history, physical exam and medical decision-making activities of this service and have verified that Sherri service and findings are accurately documented in Sherri student's note.  Sherri Courser, MD 09/20/2017, 7:38 PM

## 2017-09-20 NOTE — Progress Notes (Signed)
CC: tremor, anxiety, vomiting   HPI:Sherri Mcmillan is a 84 y.o. female who presents today for evaluation of a tremor, vomiting, and anxiety.  Migraine, anxiety, nausea, severe vomiting, inability to tolerate oral intake: Patient stated that she began to develop a severe migraine this past Friday morning.  The headache worsened into Saturday but improved sufficiently to the extent that she was able to attend Sunday morning church service.  Following church, however, the migraine again occurred, was severe, and was described as having two hammers pounding in the inside of her head behind her eyes.  She subsequently developed associated auditory hallucinations described as a voice coming from within her head which prompted her to open her refrigerator door.  The "voice" instructed her to empty her refrigerator which she did.  In addition the "voice" advised her to take her medication that she was prescribed to calm her down whereupon when attempting to so do, she was encouraged to take greater than >12 tablets of this medication.  She did not recall which medication or the exact count although this information was requested at least three times during the evaluation. At some point she called her pastor which she did not recall. She felt that she possibly slept all day Monday. She denies diarrhea, muscle aches, chest pain but continues to endorse nausea, vomiting that began approximate 4 to 5 hours following ingestion of medication, fatigue wherein she slept throughout most of the 27th.  Patient ingested >12 tablets of what is thought to be Zoloft based on the  but this could not be confirmed as she did not have her tablet bottles with her.   Uncertain etiology with regard to her current presentation but concerning for CVA, migraine, medication overdose (serotonins syndrome), medication withdraw, Lewy body dementia initially to name a few.   Plan: Admit to observation for evaluation as she has minimal  support at home with only a brother and a boyfriend with whom she does not live. Given her lack of oral intake x 2 days, probable medication overdose of uncertain type, age, history of possible TIA in the past as per patient and family friend, as well as little home support she is ideally a patient who should be admitted to the hospital for observation and evaluation as indicated given her current disposition.   Past Medical History:  Diagnosis Date  . Anemia 12/14/2012  . Aortic stenosis    Dx ECHO 2008 , mild and aymptomatic , needs ECHO q 3-5 yrs  . Barrett's esophagus    Demonstrated on EGD 12/2010. EGD 09/17/2012 shows inflamed GE junctional mucosa without metaplasia, dysplasia, or malignancy.  . Bronchitis    hx of  . Cataract   . Chronic anxiety    Admission to [facility] (90s or early 2000)  for 6 weeks after mother died. Complicated again by death of her sister 2010. 2012 developed hallucinations and I refused to refill controlled meds unless she see psych which she is not agreable to  . Chronic insomnia   . Chronic pain    DJD knees, back, migranes, LLE varicose veins, and LLE neuropathy.   . Depression   . DVT (deep venous thrombosis) (HCC)   . Elevated cholesterol 10/11   LDL 155. Her 10 year risk (decreasing her age to 67 as the calculator won't go to age 26) is 21% ish. If consider her non smoker (which I wouldn't even though she says 1 pak lasts 2 weeks) risk is 12% ish. So goal  for LDL is 130 or 100.  Marland Kitchen Gastroparesis    Demonstrated on GES 12/2010 by Dr Dalene Seltzer  . GERD (gastroesophageal reflux disease)   . H/O hiatal hernia   . HTN (hypertension)    Controlled with 2 drug therapy  . Left leg DVT (HCC) 09/28/2012  . Migraine    sometimes   Review of Systems:   Review of Systems  Constitutional: Negative for chills, diaphoresis and fever.  HENT: Negative for ear pain and sinus pain.   Eyes: Negative for blurred vision, photophobia and redness.  Respiratory: Negative for cough  and shortness of breath.   Cardiovascular: Negative for chest pain, palpitations and leg swelling.  Gastrointestinal: Positive for heartburn, nausea and vomiting. Negative for abdominal pain, constipation and diarrhea.  Genitourinary: Negative for dysuria, flank pain and urgency.  Musculoskeletal: Negative for myalgias.  Neurological: Positive for tremors and headaches. Negative for dizziness and tingling.  Psychiatric/Behavioral: Positive for hallucinations. Negative for depression and suicidal ideas. The patient is not nervous/anxious.    Physical Exam:  There were no vitals filed for this visit. Physical Exam  Constitutional: She is oriented to person, place, and time. She appears well-developed and well-nourished. No distress.  HENT:  Head: Normocephalic and atraumatic.  Cardiovascular: Normal rate and regular rhythm.  No murmur heard. Pulmonary/Chest: Effort normal and breath sounds normal. No stridor. No respiratory distress.  Abdominal: Soft. Bowel sounds are normal. She exhibits no distension. There is no tenderness.  Musculoskeletal: She exhibits no edema or tenderness.  Neurological: She is alert and oriented to person, place, and time.  Skin: Skin is warm. Capillary refill takes less than 2 seconds. She is not diaphoretic.  Psychiatric: Her mood appears anxious. Her affect is labile. Her affect is not angry. Her speech is delayed. She is withdrawn and actively hallucinating (Auditory hallucinations ). Cognition and memory are not impaired. She does not express inappropriate judgment (Denied suicidal thoughts or ideations). She exhibits a depressed mood.  Vitals reviewed.  Assessment & Plan:   See Encounters Tab for problem based charting.  Patient seen with Dr. Heide Spark

## 2017-09-20 NOTE — ED Notes (Signed)
Called x3 for triage no respsonse

## 2017-09-20 NOTE — Assessment & Plan Note (Signed)
Migraine, anxiety, nausea, severe vomiting, inability to tolerate oral intake: Patient stated that she began to develop a severe migraine this past Friday morning.  The headache worsened into Saturday but improved sufficiently to the extent that she was able to attend Sunday morning church service.  Following church, however, the migraine again occurred, was severe, and was described as having two hammers pounding in the inside of her head behind her eyes.  She subsequently developed associated auditory hallucinations described as a voice coming from within her head which prompted her to open her refrigerator door.  The "voice" instructed her to empty her refrigerator which she did.  In addition the "voice" advised her to take her medication that she was prescribed to calm her down whereupon when attempting to so do, she was encouraged to take greater than >12 tablets of this medication.  She did not recall which medication or the exact count although this information was requested at least three times during the evaluation. At some point she called her pastor which she did not recall. She felt that she possibly slept all day Monday. She denies diarrhea, muscle aches, chest pain but continues to endorse nausea, vomiting that began approximate 4 to 5 hours following ingestion of medication, fatigue wherein she slept throughout most of the 27th.  Patient ingested >12 tablets of what is thought to be Zoloft based on the  but this could not be confirmed as she did not have her tablet bottles with her.   Uncertain etiology with regard to her current presentation but concerning for CVA, migraine, medication overdose (serotonins syndrome), medication withdraw, Lewy body dementia initially to name a few.   Plan: Admit to observation for evaluation as she has minimal support at home with only a brother and a boyfriend with whom she does not live. Given her lack of oral intake x 2 days, probable medication overdose of  uncertain type, age, history of possible TIA in the past as per patient and family friend, as well as little home support she is ideally a patient who should be admitted to the hospital for observation and evaluation as indicated given her current disposition.

## 2017-09-20 NOTE — Patient Instructions (Signed)
Admitted to inpatient

## 2017-09-20 NOTE — Progress Notes (Signed)
Family members brought in all meds in pts home.   meds include: Reglan  Omeprazole 40 mg Melatonin  and  Guafenesin dm  Ranitidine  Amlodipine 10 mg Sertraline  ProAir Benadryl 25 mg Tylenol PM 500/25mg    Pt does not know which bottles she took medication from. Family members at bedside given meds to take home.

## 2017-09-21 ENCOUNTER — Other Ambulatory Visit: Payer: Self-pay

## 2017-09-21 ENCOUNTER — Telehealth: Payer: Self-pay

## 2017-09-21 ENCOUNTER — Encounter (HOSPITAL_COMMUNITY): Payer: Self-pay | Admitting: General Practice

## 2017-09-21 DIAGNOSIS — K219 Gastro-esophageal reflux disease without esophagitis: Secondary | ICD-10-CM | POA: Diagnosis not present

## 2017-09-21 DIAGNOSIS — R51 Headache: Secondary | ICD-10-CM | POA: Diagnosis not present

## 2017-09-21 DIAGNOSIS — T50901A Poisoning by unspecified drugs, medicaments and biological substances, accidental (unintentional), initial encounter: Secondary | ICD-10-CM | POA: Diagnosis not present

## 2017-09-21 DIAGNOSIS — F419 Anxiety disorder, unspecified: Secondary | ICD-10-CM

## 2017-09-21 DIAGNOSIS — T50902A Poisoning by unspecified drugs, medicaments and biological substances, intentional self-harm, initial encounter: Secondary | ICD-10-CM

## 2017-09-21 DIAGNOSIS — Z87891 Personal history of nicotine dependence: Secondary | ICD-10-CM | POA: Diagnosis not present

## 2017-09-21 DIAGNOSIS — I10 Essential (primary) hypertension: Secondary | ICD-10-CM

## 2017-09-21 DIAGNOSIS — R440 Auditory hallucinations: Secondary | ICD-10-CM

## 2017-09-21 DIAGNOSIS — G4700 Insomnia, unspecified: Secondary | ICD-10-CM

## 2017-09-21 DIAGNOSIS — D509 Iron deficiency anemia, unspecified: Secondary | ICD-10-CM | POA: Diagnosis not present

## 2017-09-21 DIAGNOSIS — Z736 Limitation of activities due to disability: Secondary | ICD-10-CM | POA: Diagnosis not present

## 2017-09-21 DIAGNOSIS — R12 Heartburn: Secondary | ICD-10-CM

## 2017-09-21 LAB — IRON AND TIBC
Iron: 30 ug/dL (ref 28–170)
Saturation Ratios: 7 % — ABNORMAL LOW (ref 10.4–31.8)
TIBC: 409 ug/dL (ref 250–450)
UIBC: 379 ug/dL

## 2017-09-21 LAB — HEMOGLOBIN A1C
Hgb A1c MFr Bld: 5.7 % — ABNORMAL HIGH (ref 4.8–5.6)
Mean Plasma Glucose: 116.89 mg/dL

## 2017-09-21 LAB — FERRITIN: Ferritin: 7 ng/mL — ABNORMAL LOW (ref 11–307)

## 2017-09-21 MED ORDER — SERTRALINE HCL 50 MG PO TABS: 75.0000 mg | ORAL_TABLET | Freq: Every day | ORAL | 0 refills | Status: DC

## 2017-09-21 MED ORDER — FERROUS SULFATE 325 (65 FE) MG PO TABS: 325.0000 mg | ORAL_TABLET | Freq: Every day | ORAL | 3 refills | Status: DC

## 2017-09-21 MED ORDER — ENOXAPARIN SODIUM 40 MG/0.4ML ~~LOC~~ SOLN: 40.0000 mg | SUBCUTANEOUS | Status: DC

## 2017-09-21 NOTE — Progress Notes (Signed)
CSW provided Psychiatric follow up provider list on patient's discharge packet so she can call to schedule appointment.   CSW signing off.  Osborne Casco  LCSW [phone]

## 2017-09-21 NOTE — Telephone Encounter (Signed)
Hospital TOC per Dr Earlene Plater, discharge 09/21/2017, appt 09/29/2017.

## 2017-09-21 NOTE — Progress Notes (Signed)
Sherri Mcmillan Fairy to be D/C'd Home per MD order.  Discussed with the patient and all questions fully answered.  VSS, Skin clean, dry and intact without evidence of skin break down, no evidence of skin tears noted. IV catheter discontinued intact. Site without signs and symptoms of complications. Dressing and pressure applied.  An After Visit Summary was printed and given to the patient. Patient received prescription.  D/c education completed with patient/family including follow up instructions, medication list, d/c activities limitations if indicated, with other d/c instructions as indicated by MD - patient able to verbalize understanding, all questions fully answered.   Patient instructed to return to ED, call 911, or call MD for any changes in condition.   Patient escorted via WC, and D/C home via private auto.  [name] 09/21/2017 6:20 PM

## 2017-09-21 NOTE — Discharge Summary (Addendum)
Name: Sherri Mcmillan MRN: [id number] DOB: 1932/07/30 84 y.o. PCP: Levora Dredge, MD  Date of Admission: 09/20/2017  5:20 PM Date of Discharge: 09/21/2017 Attending Physician: Dr. Sandre Kitty  Discharge Diagnosis: Principal Problem:   Drug overdose Active Problems:   Anxiety and depression   Auditory hallucinations   Headache   Acute encephalopathy   Iron deficiency anemia  Discharge Medications: Allergies as of 09/21/2017      Reactions   Aspirin Rash      Medication List    TAKE these medications   amLODipine 10 MG tablet Commonly known as:  NORVASC Take 1 tablet (10 mg total) by mouth daily.   dexlansoprazole 60 MG capsule Commonly known as:  DEXILANT Take 1 capsule (60 mg total) by mouth daily.   ferrous sulfate 325 (65 FE) MG tablet Take 1 tablet (325 mg total) by mouth daily. Notes to patient:  With food    gabapentin 600 MG tablet Commonly known as:  NEURONTIN Take 1 tablet (600 mg total) by mouth at bedtime.   metoCLOPramide 5 MG tablet Commonly known as:  REGLAN TAKE 1 TABLET BY MOUTH EVERYDAY AT BEDTIME   PROAIR HFA 108 (90 Base) MCG/ACT inhaler Generic drug:  albuterol INHALE 2 PUFFS INTO THE LUNGS EVERY 4 (FOUR) HOURS AS NEEDED FOR WHEEZING OR SHORTNESS OF BREATH.   ranitidine 300 MG tablet Commonly known as:  ZANTAC Take 1 tablet (300 mg total) by mouth at bedtime.   sertraline 50 MG tablet Commonly known as:  ZOLOFT Take 1.5 tablets (75 mg total) by mouth daily. What changed:  how much to take   sucralfate 1 GM/10ML suspension Commonly known as:  CARAFATE Take 10 mLs (1 g total) by mouth at bedtime as needed.       Disposition and follow-up:   [name] was discharged from [facility] in Stable condition.  At the hospital follow up visit please address:  1.  Please ensure that she has found a therapist and has established regular psychiatric follow-up for her anxiety. Please encourage lifestyle changes for iron    2.  Labs / imaging needed at time of follow-up: Repeat CBC in 3 months due to iron deficiency anemia.  3.  Pending labs/ test needing follow-up: None  Follow-up Appointments: Follow-up Information    [facility]. Call.   Specialty:  Professional Counselor Why:  Please call to see if they accept your insurance and can make an appointment.  Contact information: [address] [place] [address] [phone]        [name] Call.   Specialty:  Family Medicine Why:  Please call to see if they accept your insurance and can make an appointment.  Contact information: [address] [place]ella Raring [place] [address] [phone]        [facility]. Call.   Specialty:  Behavioral Health Why:  Please call to see if they accept your insurance and can make an appointment.  Contact information: [address] [place] [address] [phone]        [facility]. Call.   Specialty:  Behavioral Health Why:  Please call to see if they accept your insurance and can make an appointment.  Contact information: [address] [place] [address] [phone]           Hospital Course by problem list: Principal Problem:  Drug overdose Active Problems:   Anxiety and depression   Auditory hallucinations   Headache   Acute encephalopathy   Iron deficiency anemia   1. 1. Drug overdose Command auditory hallucination instructed her to take an entire bottle of one of her old de-prescribed medications on Sunday, 5/26. She slept all day on Monday, and presented to clinic on Tuesday, from where she was directly admitted to the hospital. Unknown which medication, but suspect Trazodone. Reflexes 2+ throughout. Pupils 6mm and poorly reactive, but the remainder of her neurological exam was normal. HR in high 50's and stable, and the rest of  her vitals remained within normal limits. She remained alert, oriented, and organized throughout the hospitalization. ECG showed sinus bradycardia. Acetaminophen and salicylate levels were normal; INR was normal.  2. Suspected auditory hallucinations A "voice" instructed her to empty her refrigerator and to take an entire bottle of pills. Psychiatry was consulted, and does not feel that she presents a danger to herself or others. Psychiatry developed a safety plan, in which her boyfriend [name] would manage her medications for the time being, and [name] would alert a family member or call 911 if a "voice" instructed her to do something dangerous.  3. Anxiety History of anxiety, currently exacerbated in the setting of severe headache and suspected auditory hallucinations. Per psychiatry recs, Zoloft increased to 75 mg QD. Start melatonin QHS for sleep. Social work consulted to connect her with an outpatient therapist.  4. Headache New onset severe intermittent bitemporal headache associated with nausea and vomiting since Friday. Received Toradol, Compazine, and Benadryl, which partially alleviated. Also partially alleviated by Tylenol. Head CT negative. Her description of a bilateral temporal headache that occurs during periods of anxiety is consistent with a tension headache. She can continue Tylenol and NSAIDs as an outpatient.  5. GERD with Barrett's s/p Nissen fundoplication. On pantoprazole 40 mg BID, famotidine 20 mg QHS, and sucralfate 1 gm QHS PRN while inpatient. Resume dome Dexilant, Carafate, Zantac, and Reglan as outpatient.  6. Hypertension Continued home amlodipine.  7. Impaired glucose tolerance.  A1c of 5.7. POC glucose 134 on presentation. Can likely benefit from further follow-up as an outpatient, including lifestyle changes and possible metformin.  8. Iron deficiency anemia Hgb of 8.6. MCV of 77. MCHC of 29. Iron of 30. TIBC of 409. Ferritin of 7. All consistent with iron  deficiency. Instructed to take OTC Fe 325. Will need repeat CBC as an outpatient.  Discharge Vitals:   BP 132/77 (BP Location: Right Arm)   Pulse (!) 58   Temp 98.7 F (37.1 C) (Oral)   Resp 17   Ht 5' (1.524 m)   Wt 113 lb 5.1 oz (51.4 kg)   LMP 04/26/1950   SpO2 100%   BMI 22.13 kg/m   Pertinent Labs, Studies, and Procedures:  Hgb of 8.6. MCV of 77. MCHC of 29. Iron of 30. TIBC of 409. Ferritin of 7. A1c of 5.7  Discharge Instructions: Discharge Instructions    Diet - low sodium heart healthy   Complete by:  As directed    Discharge instructions   Complete by:  As directed    Ms Elveria, Lauderbaugh were evaluated by our psychiatry team here in the hospital.  They recommended increasing your ZOLOFT to  daily (1.5 pills).  We have sent in a new prescription to your pharmacy for this.  They also recommended trying MELATONIN at bedtime to help with sleep.  This medication can be obtained over  the counter at your pharmacy or at the [facility].  You were also given resources for counseling in the community.  Please follow up as scheduled on June 6 in the Internal Medicine Clinic.  They can help with further medication and counseling resources at that time if needed.  Your iron levels were also low so we have prescribed an oral iron pill and sent this to your pharmacy as well.   Take Care.   Increase activity slowly   Complete by:  As directed       Signed: Gwynn Burly, DO 09/22/2017, 3:58 PM

## 2017-09-21 NOTE — Consult Note (Addendum)
[facility] Consult   Reason for Consult:  Command auditory hallucinations  Referring Physician:  Dr. Rebeca Alert  Patient Identification: Sherri Mcmillan MRN:  [id number] Principal Diagnosis: Drug overdose Diagnosis:   Patient Active Problem List   Diagnosis Date Noted  . Acute encephalopathy [G93.40] 09/20/2017  . Drug overdose [T50.901A] 09/20/2017  . Vitamin D deficiency [E55.9] 06/30/2016  . Ataxia [R27.0] 06/24/2016  . Aortic atherosclerosis ([place]) [I70.0] 06/24/2016  . Iron deficiency anemia due to chronic blood loss [D50.0] 06/24/2016  . Abnormal finding on MRI of brain [R90.89] 06/24/2016  . Chronic bronchitis ([place]) [J42] 08/14/2015  . Allergic rhinitis [J30.9] 07/03/2015  . Osteoporosis [M81.0] 02/19/2014  . Pulmonary nodule [R91.1] 02/10/2014  . S/P Nissen fundoplication (without gastrostomy tube) procedure [Z98.890] 05/02/2013  . Hiatal hernia [K44.9] 02/19/2013  . GERD (gastroesophageal reflux disease) [K21.9] 08/03/2010  . Hyperlipidemia [E78.5]   . Anxiety and depression [F41.9, F32.9]   . HTN (hypertension) [I10]   . Aortic stenosis [I35.0]   . Epidermiod Cyst [L72.3] 03/12/2008  . Insomnia [G47.00] 07/27/2006  . Chronic venous insufficiency [I87.2] 07/06/2006    Total Time spent with patient: 1 hour  Subjective:   Sherri Mcmillan is a 85 y.o. female patient admitted with overdose.   HPI:  Per chart review, patient was admitted to the hospital following an overdose two days ago of unknown medication. She reports onset of a severe migraine on Friday that did not improve until Sunday. On Sunday, she reports developing command auditory hallucinations prompting her to open the refrigerator door and taking her medications to calm down. She reports that a female's voice told her to take greater than 12 tablets of her medication. She is unable to recall which medication she took or the quantity.  She reports hearing "God screaming at me that I had tried to kill  myself." She stopped taking Lexapro 20 mg daily and Trazodone 100 mg qhs a month ago because she felt like they were no longer effective. She was started on Zoloft 50 mg daily on 5/16. She is prescribed Gabapentin 600 mg qhs and Ramelteon 8 mg qhs PRN in the hospital.   On interview, Sherri Mcmillan reports that she frequently has migraine headaches which cause her to hear a "zing sound."  She reports that this past weekend her migraine was worse than usual.  She reports that the voices are not discernible at this time.  Her friend came over after church on Sunday.  She reports that she could make out what the voices were saying on Sunday and it caused anxiety.  She was screaming and a female's voice said "go go go."  She recalls going to get her medication.  She took 2 pills and heard the voices say "more more more" so she believes that she took a handful of medication.  She does not remember what she took.  She reports feeling so anxious that she wanted to go to sleep.  She slept for a day and asked her friend to take her to the hospital yesterday since she was not feeling well secondary to upset stomach with vomiting.  She denies thoughts to harm herself and reports feeling regretful about what happened although she does not feel like it was in her control.  She reports being religious.  She is heavily involved in church and reports attending a spiritual growth group.  She denies a prior history of psychosis.  She denies SI or HI.  She reports poor appetite with  weight loss.  She is unsure how much weight she has lost.  She reports chronic problems with sleep due to multiple nighttime awakenings.  Trazodone became ineffective for sleep.  She denies anhedonia and enjoys cleaning her house.  She does report a history of anxiety secondary to migraines.  She reports going to therapy in the past which helped her anxiety.   Patient's boyfriend was contacted with her verbal consent. He denies concerns for her safety and is  agreeable to monitoring her and administering her medications.    Past Psychiatric History: Depression. She denies a history of suicide attempts.   Risk to Self: Is patient at risk for suicide?: No Risk to Others:  None. Denies HI.  Prior Inpatient Therapy:  She was hospitalized for anxiety 12 years ago. Prior Outpatient Therapy:  She is followed by her PCP.   Past Medical History:  Past Medical History:  Diagnosis Date  . Anemia 12/14/2012  . Aortic stenosis    Dx ECHO 2008 , mild and aymptomatic , needs ECHO q 3-5 yrs  . Barrett's esophagus    Demonstrated on EGD 12/2010. EGD 09/17/2012 shows inflamed GE junctional mucosa without metaplasia, dysplasia, or malignancy.  . Bronchitis    hx of  . Cataract   . Chronic anxiety    Admission to [facility] (90s or early 2000)  for 6 weeks after mother died. Complicated again by death of her sister 2010. 2012 developed hallucinations and I refused to refill controlled meds unless she see psych which she is not agreable to  . Chronic insomnia   . Chronic pain    DJD knees, back, migranes, LLE varicose veins, and LLE neuropathy.   . Depression   . DVT (deep venous thrombosis) ([place])   . Elevated cholesterol 10/11   LDL 155. Her 10 year risk (decreasing her age to 86 as the calculator won't go to age 64) is 21% ish. If consider her non smoker (which I wouldn't even though she says 1 pak lasts 2 weeks) risk is 12% ish. So goal for LDL is 130 or 100.  Marland Kitchen Gastroparesis    Demonstrated on GES 12/2010 by Dr Karmen Stabs  . GERD (gastroesophageal reflux disease)   . H/O hiatal hernia   . HTN (hypertension)    Controlled with 2 drug therapy  . Left leg DVT ([place]) 09/28/2012  . Migraine    sometimes    Past Surgical History:  Procedure Laterality Date  . [place] STUDY N/A 08/24/2017   Procedure: 24 HOUR PH STUDY IMPEDANCE;  Surgeon: Doran Stabler, MD;  Location: [facility];  Service: Gastroenterology;  Laterality: N/A;  . ABDOMINAL HYSTERECTOMY    .  APPENDECTOMY    . CATARACT EXTRACTION     left eye  . COLONOSCOPY  2000&2005   Dr.Magod  . DIRECT LARYNGOSCOPY   September 2008    preoperative diagnosis hoarseness with anterior right vocal cord lesion -  direct laryngoscopy and excisional biopsy of right anterior vocal cord lesion done by Dr. Lucia Gaskins  . ENDOVENOUS ABLATION SAPHENOUS VEIN W/ LASER Left 05-09-2014   EVLA left small saphenous vein by Curt Jews MD  . ESOPHAGEAL MANOMETRY N/A 08/24/2017   Procedure: ESOPHAGEAL MANOMETRY (EM);  Surgeon: Doran Stabler, MD;  Location: [facility];  Service: Gastroenterology;  Laterality: N/A;  . ESOPHAGOGASTRODUODENOSCOPY  11/2010  . ESOPHAGOGASTRODUODENOSCOPY N/A 09/17/2012   Procedure: ESOPHAGOGASTRODUODENOSCOPY (EGD);  Surgeon: Lafayette Dragon, MD;  Location: [facility] ENDOSCOPY;  Service: Endoscopy;  Laterality: N/A;  . ESOPHAGOGASTRODUODENOSCOPY N/A 12/17/2012   Procedure: ESOPHAGOGASTRODUODENOSCOPY (EGD);  Surgeon: Jerene Bears, MD;  Location: [facility];  Service: Gastroenterology;  Laterality: N/A;  . EXCISION MASS HEAD N/A 03/05/2016   Procedure: EXCISION POSTERIOR SCALP MASS;  Surgeon: Ralene Ok, MD;  Location: [facility];  Service: General;  Laterality: N/A;  . EYE SURGERY    . HEMORRHOID SURGERY    . KNEE ARTHROSCOPY    . LAPAROSCOPIC NISSEN FUNDOPLICATION N/A 7/0/2637   Procedure: LAPAROSCOPIC NISSEN FUNDOPLICATION;  Surgeon: [name], MD;  Location: [facility];  Service: General;  Laterality: N/A;  . MENISCECTOMY   July 2002    preoperative diagnosis torn medial meniscus right knee, partial medial meniscectomy, debridement chondroplasty patellofemoral joint, done by Dr. French Ana  . POLYPECTOMY  2000   Dr.Magod  . ROTATOR CUFF REPAIR  09/21/2011   rt shoulder   Family History:  Family History  Problem Relation Age of Onset  . Heart disease Mother   . Hypertension Mother   . Heart attack Mother   . Hypertension Father   . Heart attack Father   . Diabetes Brother   . Stroke  Neg Hx   . Cancer Neg Hx   . Colon cancer Neg Hx   . Anesthesia problems Neg Hx   . Hypotension Neg Hx   . Malignant hyperthermia Neg Hx   . Pseudochol deficiency Neg Hx   . Stomach cancer Neg Hx   . Rectal cancer Neg Hx   . Esophageal cancer Neg Hx    Family Psychiatric  History: Denies  Social History:  Social History   Substance and Sexual Activity  Alcohol Use No  . Alcohol/week: 0.0 oz   Comment: quit 24 years ago     Social History   Substance and Sexual Activity  Drug Use No    Social History   Socioeconomic History  . Marital status: Single    Spouse name: Not on file  . Number of children: 0  . Years of education: some college  . Highest education level: Not on file  Occupational History  . Occupation: disabled  Social Needs  . Financial resource strain: Not on file  . Food insecurity:    Worry: Not on file    Inability: Not on file  . Transportation needs:    Medical: Not on file    Non-medical: Not on file  Tobacco Use  . Smoking status: Former Smoker    Years: 50.00    Types: Cigarettes    Last attempt to quit: 06/01/2010    Years since quitting: 7.3  . Smokeless tobacco: Never Used  Substance and Sexual Activity  . Alcohol use: No    Alcohol/week: 0.0 oz    Comment: quit 24 years ago  . Drug use: No  . Sexual activity: Not on file  Lifestyle  . Physical activity:    Days per week: Not on file    Minutes per session: Not on file  . Stress: Not on file  Relationships  . Social connections:    Talks on phone: Not on file    Gets together: Not on file    Attends religious service: Not on file    Active member of club or organization: Not on file    Attends meetings of clubs or organizations: Not on file    Relationship status: Not on file  Other Topics Concern  . 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     No children.  Education: some college.    Additional Social History: She lives at home alone. She has a good support system including her church. She has a boyfriend of 40 years. She has 2 children in [place]. She denies illicit substance or alcohol use.     Allergies:   Allergies  Allergen Reactions  . Aspirin Rash    Labs:  Results for orders placed or performed during the hospital encounter of 09/20/17 (from the past 48 hour(s))  Acetaminophen level     Status: Abnormal   Collection Time: 09/20/17  6:24 PM  Result Value Ref Range   Acetaminophen (Tylenol), Serum <10 (L) 10 - 30 ug/mL    Comment: (NOTE) Therapeutic concentrations vary significantly. A range of 10-30 ug/mL  may be an effective concentration for many patients. However, some  are best treated at concentrations outside of this range. Acetaminophen concentrations >150 ug/mL at 4 hours after ingestion  and >50 ug/mL at 12 hours after ingestion are often associated with  toxic reactions. Performed at [facility], [place] [address] [place], [place] [address]   CBC with Differential/Platelet     Status: Abnormal   Collection Time: 09/20/17  6:24 PM  Result Value Ref Range   WBC 5.0 4.0 - 10.5 K/uL   RBC 3.87 3.87 - 5.11 MIL/uL   Hemoglobin 8.6 (L) 12.0 - 15.0 g/dL   HCT 29.7 (L) 36.0 - 46.0 %   MCV 76.7 (L) 78.0 - 100.0 fL   MCH 22.2 (L) 26.0 - 34.0 pg   MCHC 29.0 (L) 30.0 - 36.0 g/dL   RDW 16.4 (H) 11.5 - 15.5 %   Platelets 152 150 - 400 K/uL   Neutrophils Relative % 62 %   Neutro Abs 3.1 1.7 - 7.7 K/uL   Lymphocytes Relative 28 %   Lymphs Abs 1.4 0.7 - 4.0 K/uL   Monocytes Relative 10 %   Monocytes Absolute 0.5 0.1 - 1.0 K/uL   Eosinophils Relative 0 %   Eosinophils  Absolute 0.0 0.0 - 0.7 K/uL   Basophils Relative 0 %   Basophils Absolute 0.0 0.0 - 0.1 K/uL   Immature Granulocytes 0 %   Abs Immature Granulocytes 0.0 0.0 - 0.1 K/uL    Comment: Performed at [facility], [address] [place], [place] [address]  Comprehensive metabolic panel     Status: Abnormal   Collection Time: 09/20/17  6:24 PM  Result Value Ref Range   Sodium 139 135 - 145 mmol/L   Potassium 3.4 (L) 3.5 - 5.1 mmol/L   Chloride 106 101 - 111 mmol/L   CO2 25 22 - 32 mmol/L   Glucose, Bld 109 (H) 65 - 99 mg/dL   BUN 13 6 - 20 mg/dL   Creatinine, Ser 0.81 0.44 - 1.00 mg/dL   Calcium 9.5 8.9 - 10.3 mg/dL   Total Protein 7.7 6.5 - 8.1 g/dL   Albumin 4.0 3.5 - 5.0 g/dL   AST 17 15 - 41 U/L   ALT 10 (  L) 14 - 54 U/L   Alkaline Phosphatase 64 38 - 126 U/L   Total Bilirubin 0.3 0.3 - 1.2 mg/dL   GFR calc non Af Amer >60 >60 mL/min   GFR calc Af Amer >60 >60 mL/min    Comment: (NOTE) The eGFR has been calculated using the CKD EPI equation. This calculation has not been validated in all clinical situations. eGFR's persistently <60 mL/min signify possible Chronic Kidney Disease.    Anion gap 8 5 - 15    Comment: Performed at [facility] [address] [place], [place] [address]  Protime-INR     Status: None   Collection Time: 09/20/17  6:24 PM  Result Value Ref Range   Prothrombin Time 13.4 11.4 - 15.2 seconds   INR 1.03     Comment: Performed at [facility] [address] [place], [place] [address]  Salicylate level     Status: None   Collection Time: 09/20/17  6:24 PM  Result Value Ref Range   Salicylate Lvl <1.2 2.8 - 30.0 mg/dL    Comment: Performed at [facility] [address] [place],  [address]    Current Facility-Administered Medications  Medication Dose Route Frequency Provider Last Rate Last Dose  . 0.9 %  sodium chloride infusion   Intravenous Continuous Lorella Nimrod, MD 100 mL/hr at 09/21/17 0651    . acetaminophen  (TYLENOL) tablet 650 mg  650 mg Oral Q6H PRN Alphonzo Grieve, MD   650 mg at 09/21/17 0946   Or  . acetaminophen (TYLENOL) suppository 650 mg  650 mg Rectal Q4H PRN Alphonzo Grieve, MD      . amLODipine (NORVASC) tablet 10 mg  10 mg Oral Daily Lorella Nimrod, MD   10 mg at 09/21/17 0942  . enoxaparin (LOVENOX) injection 40 mg  40 mg Subcutaneous Q24H Oda Kilts, MD      . famotidine (PEPCID) tablet 20 mg  20 mg Oral QHS Alphonzo Grieve, MD   20 mg at 09/20/17 2026  . gabapentin (NEURONTIN) tablet 600 mg  600 mg Oral QHS Lorella Nimrod, MD   600 mg at 09/20/17 2243  . ondansetron (ZOFRAN) injection 4 mg  4 mg Intravenous Q8H PRN Alphonzo Grieve, MD      . pantoprazole (PROTONIX) EC tablet 40 mg  40 mg Oral BID Lorella Nimrod, MD   40 mg at 09/21/17 0941  . ramelteon (ROZEREM) tablet 8 mg  8 mg Oral QHS PRN Lorella Nimrod, MD      . sodium chloride flush (NS) 0.9 % injection 3 mL  3 mL Intravenous Q12H Lorella Nimrod, MD   3 mL at 09/20/17 2243  . sucralfate (CARAFATE) 1 GM/10ML suspension 1 g  1 g Oral QHS PRN Lorella Nimrod, MD        Musculoskeletal: Strength & Muscle Tone: decreased due to physical deconditioning.  Gait & Station: UTA since patient was lying in bed. Patient leans: N/A  Psychiatric Specialty Exam: Physical Exam  Nursing note and vitals reviewed. Constitutional: She is oriented to person, place, and time. She appears well-developed.  thin  HENT:  Head: Normocephalic and atraumatic.  Neck: Normal range of motion.  Respiratory: Effort normal.  Musculoskeletal: Normal range of motion.  Neurological: She is alert and oriented to person, place, and time.  Skin: No rash noted.  Psychiatric: She has a normal mood and affect. Her speech is normal and behavior is normal. Judgment and thought content normal. Cognition  and memory are normal.    Review of Systems  Constitutional: Negative for chills and fever.  Cardiovascular: Negative for chest pain.  Gastrointestinal:  Positive for heartburn. Negative for abdominal pain, constipation, diarrhea, nausea and vomiting.  Psychiatric/Behavioral: Negative for depression, hallucinations, substance abuse and suicidal ideas. The patient is nervous/anxious and has insomnia.   All other systems reviewed and are negative.   Blood pressure (!) 157/82, pulse (!) 53, temperature 98.4 F (36.9 C), resp. rate 18, height 5' (1.524 m), weight 51.4 kg (113 lb 5.1 oz), last menstrual period 04/26/1950, SpO2 100 %.Body mass index is 22.13 kg/m.  General Appearance: Fairly Groomed, thin, elderly, African American female, wearing a hospital gown and wig who is lying in bed. NAD.   Eye Contact:  Good  Speech:  Clear and Coherent and Normal Rate  Volume:  Normal  Mood:  Anxious  Affect:  Congruent  Thought Process:  Goal Directed, Linear and Descriptions of Associations: Intact  Orientation:  Full (Time, Place, and Person)  Thought Content:  Logical  Suicidal Thoughts:  No  Homicidal Thoughts:  No  Memory:  Immediate;   Good Recent;   Good Remote;   Good  Judgement:  Fair  Insight:  Fair  Psychomotor Activity:  Normal  Concentration:  Concentration: Good and Attention Span: Good  Recall:  Good  Fund of Knowledge:  Good  Language:  Good  Akathisia:  No  Handed:  Right  AIMS (if indicated):   N/A  Assets:  Communication Skills Desire for Improvement Housing Intimacy Social Support  ADL's:  Intact  Cognition:  WNL  Sleep:   Poor   Assessment:  Sherri Mcmillan is a 84 y.o. female who was admitted with CAH in the setting of migraines complicated by anxiety. She denies SI, HI or AVH. She denies a prior history of psychosis. It is unclear if she experienced true psychosis. She is organized in thought process and does not appear to be responding to internal stimuli. She may have experienced another type of perceptual disturbance secondary to migraines. She is future oriented and reports a good support system as well as a  strong religious influence in her life. She is able to safety plan. She will be monitored for safety by her boyfriend. He agrees to administer her medication. She will follow up with her PCP for further medication management. SW to provide therapy resources to better manage her anxiety since she reports good effect with therapy in the past.   Treatment Plan Summary: -Increase Zoloft 50 mg daily to 75 mg daily for mood and anxiety. -Discontinue Trazodone since ineffective for sleep.  -Continue Ramelteon 8 mg qhs for insomnia. Patient educated about taking Melatonin 3-6 mg qhs if she is unable to afford Ramelteon. -SW to provide patient resources for local therapists.  -Patient will follow up with her PCP for medication management.  -Psychiatry will sign off on patient at this time. Please consult psychiatry again as needed.   Disposition: No evidence of imminent risk to self or others at present.    Faythe Dingwall, DO 09/21/2017 12:49 PM

## 2017-09-21 NOTE — Progress Notes (Addendum)
Subjective: She continues to complain of intermittent bitemporal headache, but it is somewhat better today than it was yesterday. It was partially alleviated by the Toradol, Compazine, and Benadryl overnight.  She continues to feel anxious today, particularly about the recent command auditory hallucinations. She became tearful when she was told that psychiatry was being consulted, but was amenable to the idea.  A family member brought her pill bottles. None of them contained trazodone.  Objective: Vital signs in last 24 hours: Vitals:   09/20/17 2118 09/21/17 0457 09/21/17 0941 09/21/17 1248  BP: (!) 147/95 (!) 144/70 (!) 157/82 132/77  Pulse: 72 (!) 53  (!) 58  Resp: Temp: 98.8 F (37.1 C) 98.4 F (36.9 C)  98.7 F (37.1 C)  TempSrc:    Oral  SpO2: 99% 100%  100%  Weight:      Height:       Weight change:   Intake/Output Summary (Last 24 hours) at 09/21/2017 1344 Last data filed at 09/21/2017 0324 Gross per 24 hour  Intake 935 ml  Output -  Net 935 ml   General Appearance:    Anxious-appearing sitting on edge of bed, rocking back and forth and rhythmically kicking legs. Appears younger than stated age  Head:    Normocephalic, without obvious abnormality, atraumatic  Eyes:    Pupils ~55mm bilaterally and poorly reactive to light. Conjunctiva/corneas clear     Nose:   Nares normal, septum midline, mucosa normal, no drainage   Throat:   Lips, mucosa, and tongue normal; poor dentition     Back:     Symmetric, no curvature, ROM normal, no CVA tenderness  Lungs:     Few faint expiratory wheezes but otherwise clear to auscultation bilaterally, respirations unlabored. No w/r/r  Chest Wall:    No tenderness or deformity   Heart:    Regular rate and rhythm, S1 and S2 normal, no rub or gallop. 2/6 systolic crescendo-decrescendo murmur best heard at right upper sternal border     Abdomen:     Soft, moderate TTP in epigastrium that patient reports is chronic. bowel  sounds hypoactive all four quadrants, no masses, no organomegaly        Extremities:   Extremities normal, atraumatic, no cyanosis or edema  Pulses:   LE pulses not palpable but feet are warm and well perfused  Skin:   Skin color, texture, turgor normal, no rashes or lesions  Psych:   Conversational, mental status more grossly intact than yesterday. Orientation not formally assessed but grossly oriented. Becomes tearful when discussing command auditory hallucinations. Otherwise mood is anxious and affect is congruent.  Neurologic:  Not formally assessed today. Yesterday CNII-XII were intact, normal strength, sensation and reflexes throughout    Lab Results: BMET    Component Value Date/Time   NA 139 09/20/2017 1824   NA 139 12/16/2016 1407   K 3.4 (L) 09/20/2017 1824   CL 106 09/20/2017 1824   CO2 25 09/20/2017 1824   GLUCOSE 109 (H) 09/20/2017 1824   BUN 13 09/20/2017 1824   BUN 15 12/16/2016 1407   CREATININE 0.81 09/20/2017 1824   CREATININE 0.83 04/10/2014 1426   CALCIUM 9.5 09/20/2017 1824   GFRNONAA >60 09/20/2017 1824   GFRNONAA 67 04/10/2014 1426   GFRAA >60 09/20/2017 1824   GFRAA 78 04/10/2014 1426   CBC    Component Value Date/Time   WBC 5.0 09/20/2017 1824   RBC 3.87 09/20/2017 1824   HGB 8.6 (  L) 09/20/2017 1824   HGB 8.8 (L) 12/16/2016 1407   HCT 29.7 (L) 09/20/2017 1824   HCT 29.7 (L) 12/16/2016 1407   PLT 152 09/20/2017 1824   PLT 214 12/16/2016 1407   MCV 76.7 (L) 09/20/2017 1824   MCV 78 (L) 12/16/2016 1407   MCH 22.2 (L) 09/20/2017 1824   MCHC 29.0 (L) 09/20/2017 1824   RDW 16.4 (H) 09/20/2017 1824   RDW 17.8 (H) 12/16/2016 1407   LYMPHSABS 1.4 09/20/2017 1824   LYMPHSABS 1.3 12/16/2016 1407   MONOABS 0.5 09/20/2017 1824   EOSABS 0.0 09/20/2017 1824   EOSABS 0.0 12/16/2016 1407   BASOSABS 0.0 09/20/2017 1824   BASOSABS 0.0 12/16/2016 1407   INR 1.03 Acetaminophen, salicylate within normal limits  Micro Results: No results found for this  or any previous visit (from the past 240 hour(s)). Studies/Results: Ct Head Wo Contrast  Result Date: 09/20/2017 CLINICAL DATA:  Acute headache, vomiting, history of migraines EXAM: CT HEAD WITHOUT CONTRAST TECHNIQUE: Contiguous axial images were obtained from the base of the skull through the vertex without intravenous contrast. COMPARISON:  MRI brain dated 08/06/2016 FINDINGS: Brain: No evidence of acute infarction, hemorrhage, hydrocephalus, extra-axial collection or mass lesion/mass effect. Subcortical white matter and periventricular small vessel ischemic changes. Vascular: No hyperdense vessel or unexpected calcification. Skull: Normal. Negative for fracture or focal lesion. Sinuses/Orbits: Partial opacification of the right maxillary sinus. Mastoid air cells are clear. Other: None. IMPRESSION: No evidence of acute intracranial abnormality. Small vessel ischemic changes. Electronically Signed   By: Charline Bills M.D.   On: 09/20/2017 21:27   Medications: I have reviewed the patient's current medications. Scheduled Meds: . amLODipine  10 mg Oral Daily  . enoxaparin (LOVENOX) injection  40 mg Subcutaneous Q24H  . famotidine  20 mg Oral QHS  . gabapentin  600 mg Oral QHS  . pantoprazole  40 mg Oral BID  . sodium chloride flush  3 mL Intravenous Q12H   Continuous Infusions: . sodium chloride 100 mL/hr at 09/21/17 0651   PRN Meds:.acetaminophen **OR** acetaminophen, ondansetron (ZOFRAN) IV, ramelteon, sucralfate Assessment/Plan: Active Problems:   Anxiety and depression   Acute encephalopathy   Drug overdose  Medication overdose 2/2 command auditory hallucination to take numerous pills. It remains unclear which drug she took, but likely trazodone, given that she identified it from a picture. However, sertraline (or any other of her home meds for that matter) remains a possibility. Reflexes were 2+ throughout, suggesting that serotonin syndrome is unlikely. Pupils were 6mm and poorly  reactive, but the remainder of the neurological exam was normal. Regardless of what she took, she is likely out of the initial window of danger. Her workup has been normal, her HR is in the high 50's and stable, the remainder of her vital signs are within normal limits, and her mental status is improved compared to yesterday. ECG shows sinus bradycardia. Acetaminophen and salicylate levels were normal; INR was normal. At this point she is physiologically stable, but remains inpatient due to mental health concerns.  Command auditory hallucinations Can start   Headache Hx of migraines without aura at baseline. Head CT obtained due to N/V shows only chronic ischemic changes. Some improvement with Toradol, Compazine, and Benadryl. - Toradol 15 mg - 500 mL LR bolus  GERD with Barrett's s/p Nissen fundoplication Takes dexlansoprasole, sucralfate, ranitidine, and metoclopramide at home. - Pantoprazole 40 mg BID - Famotidine 20 mg QHS - Sucralfate 1 gm QHS PRN  HTN - Home amlodipine  Anemia MCV of 77. - F/u pending ferritin, Fe, TIBC  Impaired glucose tolerance A1c of 5.7. POC glucose 134 on presentation. Can likely benefit from further follow-up as an outpatient, including lifestyle changes and possible metformin.  GI ppx: See GERD above VTE ppx: Lovenox Dispo: Admitted to obs. Possible discharge  Code status:   Code Status: DNR  This is a Psychologist, occupational Note.  The care of the patient was discussed with Dr. Earlene Plater and the assessment and plan formulated with their assistance.  Please see their attached note for official documentation of the daily encounter.   LOS: 0 days   Novella Rob, Medical Student 09/21/2017, 1:44 PM

## 2017-09-22 NOTE — Progress Notes (Signed)
Internal Medicine Clinic Attending  I saw and evaluated the patient.  I personally confirmed the key portions of the history and exam documented by Dr. Harbrecht and I reviewed pertinent patient test results.  The assessment, diagnosis, and plan were formulated together and I agree with the documentation in the resident's note.  

## 2017-09-23 NOTE — Telephone Encounter (Signed)
Attempted transition of care call- Ph# [phone]-no answer, no answering machine (unable to lve message)  Ph# [phone] answer, no answering machine (unable to lve message)

## 2017-09-26 NOTE — Telephone Encounter (Signed)
No answer at both ph#'s

## 2017-09-29 ENCOUNTER — Other Ambulatory Visit: Payer: Self-pay | Admitting: Internal Medicine

## 2017-09-29 ENCOUNTER — Telehealth: Payer: Self-pay | Admitting: *Deleted

## 2017-09-29 ENCOUNTER — Ambulatory Visit (INDEPENDENT_AMBULATORY_CARE_PROVIDER_SITE_OTHER): Payer: Medicare Other | Admitting: Internal Medicine

## 2017-09-29 ENCOUNTER — Encounter: Payer: Self-pay | Admitting: Internal Medicine

## 2017-09-29 VITALS — BP 115/56 | HR 73 | Temp 98.2°F | Ht 60.0 in | Wt 115.8 lb

## 2017-09-29 DIAGNOSIS — Z79899 Other long term (current) drug therapy: Secondary | ICD-10-CM

## 2017-09-29 DIAGNOSIS — M62838 Other muscle spasm: Secondary | ICD-10-CM

## 2017-09-29 DIAGNOSIS — D500 Iron deficiency anemia secondary to blood loss (chronic): Secondary | ICD-10-CM | POA: Diagnosis not present

## 2017-09-29 DIAGNOSIS — Z915 Personal history of self-harm: Secondary | ICD-10-CM

## 2017-09-29 DIAGNOSIS — T50904A Poisoning by unspecified drugs, medicaments and biological substances, undetermined, initial encounter: Secondary | ICD-10-CM

## 2017-09-29 DIAGNOSIS — F419 Anxiety disorder, unspecified: Secondary | ICD-10-CM

## 2017-09-29 DIAGNOSIS — G4700 Insomnia, unspecified: Secondary | ICD-10-CM

## 2017-09-29 DIAGNOSIS — F329 Major depressive disorder, single episode, unspecified: Secondary | ICD-10-CM | POA: Diagnosis not present

## 2017-09-29 DIAGNOSIS — K2100 Gastro-esophageal reflux disease with esophagitis, without bleeding: Secondary | ICD-10-CM

## 2017-09-29 DIAGNOSIS — K219 Gastro-esophageal reflux disease without esophagitis: Secondary | ICD-10-CM

## 2017-09-29 MED ORDER — DICLOFENAC SODIUM 1 % TD GEL: 2.0000 g | Freq: Four times a day (QID) | TRANSDERMAL | 1 refills | Status: DC

## 2017-09-29 MED ORDER — MELATONIN 10 MG PO TABS: 10.0000 mg | ORAL_TABLET | Freq: Every day | ORAL | 0 refills | Status: DC

## 2017-09-29 NOTE — Assessment & Plan Note (Addendum)
The patient was recently admitted to the hospital for medication overdose after command auditory hallucination instructed her to take an entire bottle of one of her old de-prescribed medications on Sunday, 5/26. Unknown which medication, but suspect Trazodone; salicylate and acetaminophen levels were normal. She remained alert, oriented, and organized throughout the hospitalization. Since discharge, she has been feeling more like her normal self. She denies recurrent auditory hallucinations or SI/HI. She was tearful when describing her experience and stated she never intended to hurt herself.  After discharge, she tried calling several therapist offices, but they did not call her back, and she does not want to attend therapy now. She is involved in her church and has been praying a lot to her through.  She declined psychiatry referral. Her mood was stable, thought process linear, and she exhibited no abnormal behavior today.  She was discharged on 75 mg of Zoloft daily (per inpatient psych recommendations), but has only been taking 50 mg daily.  Plan: -Start 75 mg of Zoloft daily -Follow up with PCP on 6/27

## 2017-09-29 NOTE — Assessment & Plan Note (Signed)
Patient states she only gets one hour of sleep daily. She has difficulty staying asleep and will wake up and clean and do chores around the house. She does not take naps during the day because she states she is busy.   Plan: -Melatonin 10 mg qhs

## 2017-09-29 NOTE — Assessment & Plan Note (Signed)
Please see anxiety and depression encounter tab.

## 2017-09-29 NOTE — Assessment & Plan Note (Deleted)
Most recent CBC with hemoglobin of 8.6, MCV 77. Ferritin 7. Patient cannot tolerate the PO iron medication and would benefit from IV iron. Will work on getting prior authorization and insurance approval for IV iron and schedule patient for transfusion. Patient is agreeable.  

## 2017-09-29 NOTE — Progress Notes (Signed)
   CC: Medication overdose, depression, anxiety   HPI:  [name] is a 84 y.o. female past medical history as documented below presenting for hospital follow-up after medication overdose secondary to anxiety and depression.  Please see encounter based charting for more description of the patient's acute and chronic medical problems.  Past Medical History:  Diagnosis Date  . Anemia 12/14/2012  . Aortic stenosis    Dx ECHO 2008 , mild and aymptomatic , needs ECHO q 3-5 yrs  . Barrett's esophagus    Demonstrated on EGD 12/2010. EGD 09/17/2012 shows inflamed GE junctional mucosa without metaplasia, dysplasia, or malignancy.  . Bronchitis    hx of  . Cataract   . Chronic anxiety    [facility]  for 6 weeks after mother died. Complicated again by death of her sister 2010. 2012 developed hallucinations and I refused to refill controlled meds unless she see psych which she is not agreable to  . Chronic insomnia   . Chronic pain    DJD knees, back, migranes, LLE varicose veins, and LLE neuropathy.   . Depression   . DVT (deep venous thrombosis) (HCC)   . Elevated cholesterol 10/11   LDL 155. Her 10 year risk (decreasing her age to [age] as the calculator won't go to age 84) is 21% ish. If consider her non smoker (which I wouldn't even though she says 1 pak lasts 2 weeks) risk is 12% ish. So goal for LDL is 130 or 100.  Marland Kitchen. Gastroparesis    Demonstrated on GES 12/2010 by Dr Dalene SeltzerKapland  . GERD (gastroesophageal reflux disease)   . H/O hiatal hernia   . HTN (hypertension)    Controlled with 2 drug therapy  . Left leg DVT (HCC) 09/28/2012  . Migraine    sometimes   Review of Systems:   Review of Systems  Constitutional: Negative for chills and fever.  Gastrointestinal: Positive for heartburn. Negative for abdominal pain, nausea and vomiting.  Genitourinary: Negative.   Psychiatric/Behavioral: Negative for depression, hallucinations and suicidal ideas. The patient has  insomnia. The patient is not nervous/anxious.     Physical Exam:  Vitals:   09/29/17 0928  BP: (!) 115/56  Pulse: 73  Temp: 98.2 F (36.8 C)  SpO2: 100%  Weight: 115 lb 12.8 oz (52.5 kg)  Height: 5' (1.524 m)  Physical Exam  Constitutional: She is oriented to person, place, and time. She appears well-developed and well-nourished. No distress.  HENT:  Head: Normocephalic and atraumatic.  Eyes: Conjunctivae are normal. No scleral icterus.  Neck: Normal range of motion. Neck supple.  Cardiovascular: Normal rate and regular rhythm.  Pulmonary/Chest: Effort normal and breath sounds normal.  Musculoskeletal: Normal range of motion. She exhibits no edema.  Lymphadenopathy:    She has no cervical adenopathy.  Neurological: She is alert and oriented to person, place, and time.  Psychiatric: She has a normal mood and affect. Her speech is normal and behavior is normal. Thought content normal. Cognition and memory are normal.    Assessment & Plan:   See Encounters Tab for problem based charting.  Patient discussed with Dr. Oswaldo DoneVincent

## 2017-09-29 NOTE — Assessment & Plan Note (Signed)
Patient is also complaining of diffuse muscle spasms that she describes as "a catch."  The spasms are very painful.  They occur on her back and on her arms.  She has associated tingling.  She also has difficulty sleeping because of use.  She was requesting medicine and states that her PCP had provided her with something in the past.  Per chart for review I could not determine what medication.  Given her recent medication overdose, I am very reluctant to prescribe a muscle relaxant or any other sedating medicine.  I believe her severe iron deficiency anemia can be to these symptoms contributing.  Plan: -Voltaren gel 4 times daily for areas affected the most bad spasms -Plan for IV iron transfusion

## 2017-09-29 NOTE — Assessment & Plan Note (Addendum)
Patient complaining of severe reflux symptoms despite take omeprazole. Per chart review, the patient was prescribed Dexilant but she has not picked up this medication from the pharmacy. She continues to use Carafate which helps. Patient has an appointment with [facility] on 10/24/2017.   Plan: -Instructed patient to start Dexilant 60 mg and stop Omeprazole -Continue Carafate and Zantac  -GI follow up 7/1

## 2017-09-29 NOTE — Assessment & Plan Note (Signed)
Most recent CBC with hemoglobin of 8.6, MCV 77. Ferritin 7. Patient cannot tolerate the PO iron medication and would benefit from IV iron. Will work on getting prior authorization and insurance approval for IV iron and schedule patient for transfusion. Patient is agreeable.

## 2017-09-29 NOTE — Telephone Encounter (Addendum)
Information sent to CoverMyMeds for PA for Diclofenac Gel.  Awaiting decision within 72 hours.  Angelina OkGladys , RN 09/29/2017 4:04 PM PA was approved for Diclofenac Gel through 04/25/2018.  Angelina OkGladys , RN 10/05/2017 12:18 PM .

## 2017-09-29 NOTE — Telephone Encounter (Signed)
Patient completed HFU today. L. , RN, BSN   

## 2017-09-29 NOTE — Patient Instructions (Addendum)
[name],   It was a pleasure meeting you today.   Please START taking 75 mg of Zoloft daily. This is one and a half tablets. Use the pill cutter I provided you to cut the tablet a tablet in half. Take one FULL tablet and a half tablet.   START taking Dexilant 60 mg daily. STOP taking Omeprazole.   START taking Melatonin 10 mg 2 hours prior to bedtime for sleep.   Follow up with Dr. Caron PresumeHelberg on June 27.

## 2017-09-30 ENCOUNTER — Other Ambulatory Visit: Payer: Self-pay | Admitting: Internal Medicine

## 2017-09-30 DIAGNOSIS — D500 Iron deficiency anemia secondary to blood loss (chronic): Secondary | ICD-10-CM

## 2017-09-30 NOTE — Progress Notes (Signed)
Internal Medicine Clinic Attending  Case discussed with Dr. LaCroce at the time of the visit.  We reviewed the resident's history and exam and pertinent patient test results.  I agree with the assessment, diagnosis, and plan of care documented in the resident's note.  

## 2017-10-02 ENCOUNTER — Other Ambulatory Visit: Payer: Self-pay | Admitting: Internal Medicine

## 2017-10-02 DIAGNOSIS — K2100 Gastro-esophageal reflux disease with esophagitis, without bleeding: Secondary | ICD-10-CM

## 2017-10-03 NOTE — Telephone Encounter (Signed)
Not on current med list - please refill or refuse . Thanks

## 2017-10-13 ENCOUNTER — Encounter: Payer: Medicare Other | Admitting: Internal Medicine

## 2017-10-14 ENCOUNTER — Other Ambulatory Visit: Payer: Self-pay | Admitting: Internal Medicine

## 2017-10-17 ENCOUNTER — Other Ambulatory Visit: Payer: Self-pay | Admitting: Gastroenterology

## 2017-10-17 DIAGNOSIS — K449 Diaphragmatic hernia without obstruction or gangrene: Secondary | ICD-10-CM

## 2017-10-17 DIAGNOSIS — K2100 Gastro-esophageal reflux disease with esophagitis, without bleeding: Secondary | ICD-10-CM

## 2017-10-20 ENCOUNTER — Encounter: Payer: Medicare Other | Admitting: Internal Medicine

## 2017-10-24 ENCOUNTER — Encounter: Payer: Self-pay | Admitting: Gastroenterology

## 2017-10-24 ENCOUNTER — Ambulatory Visit (INDEPENDENT_AMBULATORY_CARE_PROVIDER_SITE_OTHER): Payer: Medicare Other | Admitting: Gastroenterology

## 2017-10-24 VITALS — BP 134/62 | HR 68 | Ht 59.0 in | Wt 116.5 lb

## 2017-10-24 DIAGNOSIS — K2100 Gastro-esophageal reflux disease with esophagitis, without bleeding: Secondary | ICD-10-CM

## 2017-10-24 DIAGNOSIS — Z9889 Other specified postprocedural states: Secondary | ICD-10-CM | POA: Diagnosis not present

## 2017-10-24 DIAGNOSIS — R1013 Epigastric pain: Secondary | ICD-10-CM | POA: Diagnosis not present

## 2017-10-24 DIAGNOSIS — K449 Diaphragmatic hernia without obstruction or gangrene: Secondary | ICD-10-CM

## 2017-10-24 NOTE — Progress Notes (Signed)
[facility] Progress Note  Chief Complaint: GERD  Subjective  History:  Sherri Mcmillan continues to suffer from the same symptoms of heartburn, regurgitation and nausea.  The symptoms are worse when laying down at night and interfere with her sleep.  She had additional testing done, but unfortunately missed an office visit shortly after that.  She has had incomplete symptom relief from high-dose PPI and even a trial of metoclopramide.  Her appetite has been fair and weight reportedly stable.  She denies dyspnea or dysuria  The patient's Past Medical, Family and Social History were reviewed and are on file in the EMR.  Objective:  Med list reviewed  Current Outpatient Medications:  .  amLODipine (NORVASC) 10 MG tablet, Take 1 tablet (10 mg total) by mouth daily., Disp: 90 tablet, Rfl: 0 .  [employer] MELATONIN 10 MG CAPS, TAKE 10 MG BY MOUTH AT BEDTIME., Disp: 90 capsule, Rfl: 1 .  Dextromethorphan-guaiFENesin (MUCUS RELIEF DM COUGH) 20-400 MG TABS, Take 1 tablet by mouth as needed., Disp: , Rfl:  .  metoCLOPramide (REGLAN) 5 MG tablet, TAKE 1 TABLET BY MOUTH EVERYDAY AT BEDTIME, Disp: 90 tablet, Rfl: 0 .  omeprazole (PRILOSEC) 40 MG capsule, Take 40 mg by mouth 2 (two) times daily., Disp: , Rfl:  .  Polyvinyl Alcohol-Povidone (ARTIFICIAL TEARS) 5-6 MG/ML SOLN, Apply 1 drop to eye as needed., Disp: , Rfl:  .  PROAIR HFA 108 (90 Base) MCG/ACT inhaler, INHALE 2 PUFFS INTO THE LUNGS EVERY 4 (FOUR) HOURS AS NEEDED FOR WHEEZING OR SHORTNESS OF BREATH., Disp: 8.5 Inhaler, Rfl: 3 .  ranitidine (ZANTAC) 300 MG tablet, Take 300 mg by mouth at bedtime., Disp: , Rfl:  .  sertraline (ZOLOFT) 50 MG tablet, Take 1.5 tablets (75 mg total) by mouth daily., Disp: 90 tablet, Rfl: 0 .  CARAFATE 1 GM/10ML suspension, TAKE 10 MLS (1 G TOTAL) BY MOUTH 2 (TWO) TIMES DAILY. (Patient not taking: Reported on 10/24/2017), Disp: 420 mL, Rfl: 1   Vital signs in last 24 hrs: Vitals:   10/24/17 1338  BP: 134/62  Pulse:  68    Physical Exam  Well-appearing, voice is hoarse  HEENT: sclera anicteric, oral mucosa moist without lesions  Neck: supple, no thyromegaly, JVD or lymphadenopathy  Cardiac: RRR without murmurs, S1S2 heard, no peripheral edema  Pulm: clear to auscultation bilaterally, normal RR and effort noted  Abdomen: soft, no tenderness, with active bowel sounds. No guarding or palpable hepatosplenomegaly.  Skin; warm and dry, no jaundice or rash  Recent Labs:  PH and impedance testing showing significant reflux Esophageal manometry - mild decrease in distal contractility  I reviewed the operative report by Dr. Wenda LowMatt Martin in early 2015 when the fundoplication and hernia repair were performed.  @ASSESSMENTPLANBEGIN @ Assessment: Encounter Diagnoses  Name Primary?  . Gastroesophageal reflux disease with esophagitis Yes  . Abdominal pain, epigastric   . S/P Nissen fundoplication (without gastrostomy tube) procedure   . Hiatal hernia    Recurrent hiatal hernia that is contributing to severe reflux symptoms that have been incompletely responsive to medications.  She is having a lot of breakthrough symptoms and it interferes with her quality of life.  Significant reflux is confirmed on pH testing.  There is a mild dysmotility that most likely results from the reflux.  I have advised her to consult with Dr. Daphine DeutscherMartin about revision of the surgery for repeat hiatal hernia repair.  While her age increases the risk of a redo procedure, I still think  it is worth considering given the severity of her symptoms and lack of response to other therapy.  She is in favor of surgical appointment and we have placed the referral.  In the meantime she will continue current therapy.   Total time 20 minutes, over half spent face-to-face with patient in counseling and coordination of care.   Sherri Mcmillan

## 2017-10-24 NOTE — Patient Instructions (Addendum)
If you are age 82 or older, your body mass index should be between 23-30. Your Body mass index is 23.53 kg/m. If this is out of the aforementioned range listed, please consider follow up with your Primary Care Provider.  If you are age 82 or younger, your body mass index should be between 19-25. Your Body mass index is 23.53 kg/m. If this is out of the aformentioned range listed, please consider follow up with your Primary Care Provider.   [facility]. They will reach out to you in order to schedule an office visit.   It was a pleasure to see you today!  Dr. Myrtie Neitheranis

## 2017-10-25 ENCOUNTER — Telehealth: Payer: Self-pay

## 2017-10-25 NOTE — Telephone Encounter (Signed)
Referral info placed in proficient and records have been faxed to [facility] for Mr Sherri Mcmillan. Dx of recurrent hiatal hernia and GERD. Will await appointment information.

## 2017-11-01 ENCOUNTER — Other Ambulatory Visit: Payer: Self-pay | Admitting: Internal Medicine

## 2017-11-01 DIAGNOSIS — K2100 Gastro-esophageal reflux disease with esophagitis, without bleeding: Secondary | ICD-10-CM

## 2017-11-02 NOTE — Telephone Encounter (Signed)
Called pt - no answer nor VM; unable to leave message. 

## 2017-11-02 NOTE — Telephone Encounter (Signed)
Next appt scheduled 8/2 with PCP. 

## 2017-11-02 NOTE — Telephone Encounter (Signed)
Please call patient she was instructed to stop the omeprazole and start Dexlansoprazole (Dexilant) 60 mg Daily by GI

## 2017-11-02 NOTE — Telephone Encounter (Signed)
Left message for new patient coordinator that we need patient to be scheduled for an appointment and would like to be called with appointment information for our charting purposes as I have been told that patient still has not been scheduled for an appointment as we requested 1 week ago.

## 2017-11-07 NOTE — Telephone Encounter (Signed)
Scheduled for 11-09-2017 with Dr Daphine Deutschermartin at [facility].

## 2017-11-14 ENCOUNTER — Other Ambulatory Visit: Payer: Self-pay | Admitting: Surgery

## 2017-11-14 DIAGNOSIS — K449 Diaphragmatic hernia without obstruction or gangrene: Principal | ICD-10-CM

## 2017-11-14 DIAGNOSIS — K219 Gastro-esophageal reflux disease without esophagitis: Secondary | ICD-10-CM

## 2017-11-21 ENCOUNTER — Ambulatory Visit
Admission: RE | Admit: 2017-11-21 | Discharge: 2017-11-21 | Disposition: A | Discharge status: Home / Self Care | Payer: Medicare Other | Source: Ambulatory Visit | Attending: Surgery | Admitting: Surgery

## 2017-11-21 DIAGNOSIS — K449 Diaphragmatic hernia without obstruction or gangrene: Principal | ICD-10-CM

## 2017-11-21 DIAGNOSIS — K219 Gastro-esophageal reflux disease without esophagitis: Secondary | ICD-10-CM

## 2017-11-24 ENCOUNTER — Telehealth: Payer: Self-pay | Admitting: Internal Medicine

## 2017-11-24 NOTE — Telephone Encounter (Signed)
Pt states she is itching all over and its keeping her up at night, she would like for dr Caron Presumehelberg to send her something in until she sees him 8/8 at 1315, if he wants to call her she would like that. She states she has got to have something she is going crazy

## 2017-11-24 NOTE — Telephone Encounter (Signed)
[facility]. Needs evaluation to determine cause. Thank you.

## 2017-11-24 NOTE — Telephone Encounter (Signed)
Pt is calling back - requesting something for itching. Inform pt we are waiting for a response from her doctor. Stated please do something soon.

## 2017-11-24 NOTE — Telephone Encounter (Signed)
Needs medication she is itching all over

## 2017-11-25 ENCOUNTER — Other Ambulatory Visit: Payer: Self-pay | Admitting: Internal Medicine

## 2017-11-25 DIAGNOSIS — I10 Essential (primary) hypertension: Secondary | ICD-10-CM

## 2017-11-25 NOTE — Telephone Encounter (Signed)
Called pt again no answer

## 2017-11-25 NOTE — Telephone Encounter (Signed)
Called pt - no answer and no voicemail, unable to leave message.

## 2017-11-25 NOTE — Telephone Encounter (Signed)
Next appt scheduled  8/8/ with PCP. 

## 2017-11-28 NOTE — Telephone Encounter (Signed)
Called pt again - no answer.Per Epic , pt has an appt already schedule on 8/8.

## 2017-12-01 ENCOUNTER — Other Ambulatory Visit: Payer: Self-pay

## 2017-12-01 ENCOUNTER — Encounter: Payer: Self-pay | Admitting: Internal Medicine

## 2017-12-01 ENCOUNTER — Ambulatory Visit (INDEPENDENT_AMBULATORY_CARE_PROVIDER_SITE_OTHER): Payer: Medicare Other | Admitting: Internal Medicine

## 2017-12-01 VITALS — BP 143/63 | HR 72 | Temp 98.2°F | Ht 59.0 in | Wt 116.1 lb

## 2017-12-01 DIAGNOSIS — F329 Major depressive disorder, single episode, unspecified: Secondary | ICD-10-CM

## 2017-12-01 DIAGNOSIS — F419 Anxiety disorder, unspecified: Secondary | ICD-10-CM

## 2017-12-01 DIAGNOSIS — Z79899 Other long term (current) drug therapy: Secondary | ICD-10-CM

## 2017-12-01 DIAGNOSIS — K2100 Gastro-esophageal reflux disease with esophagitis, without bleeding: Secondary | ICD-10-CM

## 2017-12-01 DIAGNOSIS — I350 Nonrheumatic aortic (valve) stenosis: Secondary | ICD-10-CM

## 2017-12-01 DIAGNOSIS — G4701 Insomnia due to medical condition: Secondary | ICD-10-CM | POA: Diagnosis not present

## 2017-12-01 DIAGNOSIS — K449 Diaphragmatic hernia without obstruction or gangrene: Secondary | ICD-10-CM | POA: Diagnosis not present

## 2017-12-01 DIAGNOSIS — F32A Depression, unspecified: Secondary | ICD-10-CM

## 2017-12-01 MED ORDER — HYDROXYZINE HCL 25 MG PO TABS: 25.0000 mg | ORAL_TABLET | Freq: Every evening | ORAL | 0 refills | Status: DC | PRN

## 2017-12-01 MED ORDER — DEXLANSOPRAZOLE 60 MG PO CPDR: 60.0000 mg | DELAYED_RELEASE_CAPSULE | Freq: Two times a day (BID) | ORAL | Status: DC

## 2017-12-01 NOTE — Assessment & Plan Note (Addendum)
Reviewed recent echocardiogram in 2017 that illustrated moderate diffuse thickening and calcification with a valve area of 1.49. She remains asymptomatic with out sycnope, signs/symptoms of HF, or angina. Will continue to monitor with next Echocardiogram in 2020. If she does go for hiatal hernia repair we may need to preform an echocardiogram prior.

## 2017-12-01 NOTE — Assessment & Plan Note (Signed)
Patient struggles with anxiety and depression. She was hospitalized earlier this year after an overdose with auditory and visual hallucinations. She was evaluated by inpatient psychiatry who recommend stopping all previous SSRI/SNRI's and starting Zoloft 75 mg QD. She has not been taking this as prescribed as she feels it is not working. We discussed the need for consistency with the medication to have affects.   She also endorses periodic episodes of itching, SOB, and diaphoresis. She has had episodes like this in the past and felt that it is related to her anxiety. She does not experience these symptoms with exertion. We discussed adding Hydroxyzine 25 mg QHS PRN. We discuss that this medication does increase her risk for falls. If she gets to the point were she is requiring it daily she will come back in for re-evaluation.   Plan:  - Start Zoloft 50 mg QD  - Start Hydroxyzine 25 mg QHS PRN - Follow-up in 4 weeks.

## 2017-12-01 NOTE — Assessment & Plan Note (Addendum)
Patient continues to have severe GERD with a hiatal hernia. She is s/p Nissen Fundoplication in 2011. She has failed medical management for her GERD and has been referred for surgical evaluation (Dr. Daphine DeutscherMartin). She saw Dr. Daphine DeutscherMartin on 7/17 however, we do not have records and she is unsure of whether she will be pursuing surgery. We will contact Dr. Ermalene SearingMartin's office to obtain records.   Plan: - Obtain records from Dr. Daphine DeutscherMartin

## 2017-12-01 NOTE — Progress Notes (Signed)
   CC: Difficulty sleeping  HPI:  [name] is a 84 y.o. female who presented to the clinic for continued evaluation and management of her chronic medical illnesses. For a detailed assessment and plan please refer to problem based charting below.   Past Medical History:  Diagnosis Date  . Anemia 12/14/2012  . Aortic stenosis    Dx ECHO 2008 , mild and aymptomatic , needs ECHO q 3-5 yrs  . Ataxia 06/24/2016  . Auditory hallucinations 08/19/2010   Has has auditory hallucinations, which seem to have worsened since death of her sister Sep 2011. [facility]  for 6 weeks after mother died, also with hallucinations.  B/C of severe mental illness and refusal to see pysch, I have refused to refill controlled meds. If she decides to pursue mental health, then she needs to take the initiative and sch the appt bc Sherri Mcmillan has s  . Barrett's esophagus    Demonstrated on EGD 12/2010. EGD 09/17/2012 shows inflamed GE junctional mucosa without metaplasia, dysplasia, or malignancy.  . Bronchitis    hx of  . Cataract   . Chronic anxiety    [facility]  for 6 weeks after mother died. Complicated again by death of her sister 2010. 2012 developed hallucinations and I refused to refill controlled meds unless she see psych which she is not agreable to  . Chronic insomnia   . Chronic pain    DJD knees, back, migranes, LLE varicose veins, and LLE neuropathy.   . Depression   . DVT (deep venous thrombosis) (HCC)   . Elevated cholesterol 10/11   LDL 155. Her 10 year risk (decreasing her age to 89 as the calculator won'Mcmillan go to age 84) is 21% ish. If consider her non smoker (which I wouldn'Mcmillan even though she says 1 pak lasts 2 weeks) risk is 12% ish. So goal for LDL is 130 or 100.  Marland Kitchen. Gastroparesis    Demonstrated on GES 12/2010 by Dr Dalene SeltzerKapland  . GERD (gastroesophageal reflux disease)   . H/O hiatal hernia   . Headache 12/06/2013   Seen by Dr. Noberto Retortonika K. Allena KatzPatel at  [facility]. Started on Nortriptyline 10 mg and Tizanidine 2 mg at bed time  . HTN (hypertension)    Controlled with 2 drug therapy  . Left leg DVT (HCC) 09/28/2012  . Migraine    sometimes  . Muscle spasm 09/29/2017   Review of Systems:  Denies orthopnea, palpitations, SOB, angina, syncope, falls  Physical Exam: Vitals:   12/01/17 1309  BP: (!) 143/63  Pulse: 72  Temp: 98.2 F (36.8 C)  TempSrc: Oral  SpO2: 100%  Weight: 116 lb 1.6 oz (52.7 kg)  Height: 4\' 11"  (1.499 m)   General: Elderly female in no acute distress Pulm: Good air movement with no wheezing or crackles  CV: RRR, systolic murmur  Extremities: No LE edema Skin: Warm and dry   Assessment & Plan:   See Encounters Tab for problem based charting.  Patient discussed with Dr. Oswaldo DoneVincent

## 2017-12-01 NOTE — Patient Instructions (Addendum)
Thank you for allowing us to provide your care. Please continue your acid-reflux medications as prescribed. I will reach out to Dr. Daphine DeutscherMartin in order to obtain records.   Today we are starting a medication called Hydroxyzine 25 mg. Please take this medication at night as needed. If you are getting to the point where you need it daily please call me. This medication can increase your risk of falling, so if you feel uneasy on your feet please call me.   Continue to take your Zoloft as prescribed.   I will see you back in 4 weeks.

## 2017-12-01 NOTE — Assessment & Plan Note (Addendum)
Continue to struggle with symptoms including hoarseness, ear burning, N/V, and insomnia 2/2 reflux. She states that she is taking her medications as prescribed and has brought her medications with her today. She recently saw GI, Dr. Myrtie Neitheranis, and was referred for surgical evaluation by Dr. Daphine DeutscherMartin as it is felt her reflux is 2/2 her hernia.   Review the patient's pH probe and Manometry with her. She will continue her PPI and H2 blocker as prescribed. We will reach out to Dr. Daphine DeutscherMartin for records of her recent visit.   Plan: - Continue Dexilant 60 mg BID  - Continue Ranitidine  - Continue to follow with GI  - Obtain records from Dr. Daphine DeutscherMartin

## 2017-12-01 NOTE — Assessment & Plan Note (Signed)
Multifactorial due to her GERD and anxiety. She states that it is not nightly but significantly affects her quality of life. We discussed management of both her anxiety and GERD to help with her insomnia. Please refer to those problems for detailed evaluation and management.

## 2017-12-02 NOTE — Progress Notes (Signed)
Internal Medicine Clinic Attending  Case discussed with Dr. Helberg at the time of the visit.  We reviewed the resident's history and exam and pertinent patient test results.  I agree with the assessment, diagnosis, and plan of care documented in the resident's note.    

## 2017-12-15 ENCOUNTER — Encounter: Payer: Medicare Other | Admitting: Internal Medicine

## 2017-12-16 ENCOUNTER — Other Ambulatory Visit: Payer: Self-pay | Admitting: Gastroenterology

## 2017-12-16 DIAGNOSIS — K2100 Gastro-esophageal reflux disease with esophagitis, without bleeding: Secondary | ICD-10-CM

## 2017-12-16 DIAGNOSIS — K449 Diaphragmatic hernia without obstruction or gangrene: Secondary | ICD-10-CM

## 2017-12-23 ENCOUNTER — Other Ambulatory Visit: Payer: Self-pay | Admitting: Internal Medicine

## 2017-12-23 DIAGNOSIS — F32A Depression, unspecified: Secondary | ICD-10-CM

## 2017-12-23 DIAGNOSIS — G4701 Insomnia due to medical condition: Secondary | ICD-10-CM

## 2017-12-23 DIAGNOSIS — F329 Major depressive disorder, single episode, unspecified: Secondary | ICD-10-CM

## 2017-12-23 DIAGNOSIS — F419 Anxiety disorder, unspecified: Secondary | ICD-10-CM

## 2017-12-23 NOTE — Telephone Encounter (Signed)
Next appt scheduled 9/19 with PCP. 

## 2017-12-24 ENCOUNTER — Other Ambulatory Visit: Payer: Self-pay | Admitting: Internal Medicine

## 2017-12-24 DIAGNOSIS — F419 Anxiety disorder, unspecified: Secondary | ICD-10-CM

## 2017-12-24 DIAGNOSIS — G4701 Insomnia due to medical condition: Secondary | ICD-10-CM

## 2017-12-24 DIAGNOSIS — F329 Major depressive disorder, single episode, unspecified: Secondary | ICD-10-CM

## 2017-12-27 DIAGNOSIS — Z01818 Encounter for other preprocedural examination: Secondary | ICD-10-CM | POA: Insufficient documentation

## 2017-12-27 DIAGNOSIS — I351 Nonrheumatic aortic (valve) insufficiency: Secondary | ICD-10-CM | POA: Insufficient documentation

## 2017-12-27 NOTE — Progress Notes (Deleted)
Cardiology Office Note    Date:  12/27/2017   ID:  Sherri Mcmillan, DOB [birth year], MRN [id number]  PCP:  Levora Dredge, MD  Cardiologist: No primary care provider on file.  No chief complaint on file.   History of Present Illness:  Sherri Mcmillan is a 84 y.o. female with history of HTN, HLD, GERD. Had chest pain 10/30/2016 troponins negative.  2D echo in 2017 LVEF 65 to 70% with grade 2 DD, moderate aortic regurgitation.  Patient has never been seen by [place] before.  She had failed Nissen surgery 2015. she needs to have a laparoscopic takedown and repair of hiatal hernia possible gastroplexy, &  NG tube placement.    Past Medical History:  Diagnosis Date  . Anemia 12/14/2012  . Aortic stenosis    Dx ECHO 2008 , mild and aymptomatic , needs ECHO q 3-5 yrs  . Ataxia 06/24/2016  . Auditory hallucinations 08/19/2010   Has has auditory hallucinations, which seem to have worsened since death of her sister 2010/01/05. Admission to [facility] (90s or early 2000)  for 6 weeks after mother died, also with hallucinations.  B/C of severe mental illness and refusal to see pysch, I have refused to refill controlled meds. If she decides to pursue mental health, then she needs to take the initiative and sch the appt bc [name] has s  . Barrett's esophagus    Demonstrated on EGD 2011/01/06. EGD 09/17/2012 shows inflamed GE junctional mucosa without metaplasia, dysplasia, or malignancy.  . Bronchitis    hx of  . Cataract   . Chronic anxiety    Admission to [facility] (90s or early 2000)  for 6 weeks after mother died. Complicated again by death of her sister 2010. 2012 developed hallucinations and I refused to refill controlled meds unless she see psych which she is not agreable to  . Chronic insomnia   . Chronic pain    DJD knees, back, migranes, LLE varicose veins, and LLE neuropathy.   . Depression   . DVT (deep venous thrombosis) (HCC)   . Elevated cholesterol 10/11   LDL 155. Her 10 year risk (decreasing her age  to 37 as the calculator won't go to age 84) is 21% ish. If consider her non smoker (which I wouldn't even though she says 1 pak lasts 2 weeks) risk is 12% ish. So goal for LDL is 130 or 100.  Marland Kitchen Gastroparesis    Demonstrated on GES 01/06/2011 by Dr Dalene Seltzer  . GERD (gastroesophageal reflux disease)   . H/O hiatal hernia   . Headache 12/06/2013   Seen by Dr. Noberto Retort. Allena Katz at [facility]. Started on Nortriptyline 10 mg and Tizanidine 2 mg at bed time  . HTN (hypertension)    Controlled with 2 drug therapy  . Left leg DVT (HCC) 09/28/2012  . Migraine    sometimes  . Muscle spasm 09/29/2017    Past Surgical History:  Procedure Laterality Date  . 24 HOUR PH STUDY N/A 08/24/2017   Procedure: 24 HOUR PH STUDY IMPEDANCE;  Surgeon: Sherrilyn Rist, MD;  Location: [facility] ENDOSCOPY;  Service: Gastroenterology;  Laterality: N/A;  . ABDOMINAL HYSTERECTOMY    . APPENDECTOMY    . CATARACT EXTRACTION     left eye  . COLONOSCOPY  2000&2005   Dr.Magod  . DIRECT LARYNGOSCOPY   Jan 06, 2007   preoperative diagnosis hoarseness with anterior right vocal cord lesion -  direct laryngoscopy and excisional biopsy of right  anterior vocal cord lesion done by Dr. Ezzard Standing  . ENDOVENOUS ABLATION SAPHENOUS VEIN W/ LASER Left 05-09-2014   EVLA left small saphenous vein by Gretta Began MD  . ESOPHAGEAL MANOMETRY N/A 08/24/2017   Procedure: ESOPHAGEAL MANOMETRY (EM);  Surgeon: Sherrilyn Rist, MD;  Location: [facility] ENDOSCOPY;  Service: Gastroenterology;  Laterality: N/A;  . ESOPHAGOGASTRODUODENOSCOPY  11/2010  . ESOPHAGOGASTRODUODENOSCOPY N/A 09/17/2012   Procedure: ESOPHAGOGASTRODUODENOSCOPY (EGD);  Surgeon: Hart Carwin, MD;  Location: [facility] ENDOSCOPY;  Service: Endoscopy;  Laterality: N/A;  . ESOPHAGOGASTRODUODENOSCOPY N/A 12/17/2012   Procedure: ESOPHAGOGASTRODUODENOSCOPY (EGD);  Surgeon: Beverley Fiedler, MD;  Location: [facility] ENDOSCOPY;  Service: Gastroenterology;  Laterality: N/A;  . EXCISION MASS HEAD N/A 03/05/2016    Procedure: EXCISION POSTERIOR SCALP MASS;  Surgeon: Axel Filler, MD;  Location: [facility] OR;  Service: General;  Laterality: N/A;  . EYE SURGERY    . HEMORRHOID SURGERY    . KNEE ARTHROSCOPY    . LAPAROSCOPIC NISSEN FUNDOPLICATION N/A 05/02/2013   Procedure: LAPAROSCOPIC NISSEN FUNDOPLICATION;  Surgeon: Valarie Merino, MD;  Location: [facility] ORS;  Service: General;  Laterality: N/A;  . MENISCECTOMY   July 2002    preoperative diagnosis torn medial meniscus right knee, partial medial meniscectomy, debridement chondroplasty patellofemoral joint, done by Dr. Madelon Lips  . POLYPECTOMY  2000   Dr.Magod  . ROTATOR CUFF REPAIR  09/21/2011   rt shoulder    Current Medications: No outpatient medications have been marked as taking for the 12/28/17 encounter (Appointment) with Dyann Kief, PA-C.     Allergies:   Aspirin   Social History   Socioeconomic History  . Marital status: Single    Spouse name: Not on file  . Number of children: 1  . Years of education: some college  . Highest education level: Not on file  Occupational History  . Occupation: disabled  Social Needs  . Financial resource strain: Hard  . Food insecurity:    Worry: Often true    Inability: Often true  . Transportation needs:    Medical: Yes    Non-medical: No  Tobacco Use  . Smoking status: Former Smoker    Years: 50.00    Types: Cigarettes    Last attempt to quit: 06/01/2010    Years since quitting: 7.5  . Smokeless tobacco: Never Used  Substance and Sexual Activity  . Alcohol use: No    Alcohol/week: 0.0 standard drinks    Comment: quit 24 years ago  . Drug use: No  . Sexual activity: Not Currently    Birth control/protection: Post-menopausal, None  Lifestyle  . Physical activity:    Days per week: Not on file    Minutes per session: Not on file  . Stress: Not on file  Relationships  . Social connections:    Talks on phone: More than three times a week    Gets together: More than three times a week     Attends religious service: More than 4 times per year    Active member of club or organization: Yes    Attends meetings of clubs or organizations: More than 4 times per year    Relationship status: Widowed  Other Topics Concern  . 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     No children.  Education: some college.      Family History:  The patient's ***family history includes Diabetes in her brother; Heart attack in her father and mother; Heart disease in her mother; Hypertension in her father and mother.   ROS:   Please see the history of present illness.    ROS All other systems reviewed and are negative.   PHYSICAL EXAM:   VS:  LMP 04/26/1948   Physical Exam  GEN: Well nourished, well developed, in no acute distress  HEENT: normal  Neck: no JVD, carotid bruits, or masses Cardiac:RRR; no murmurs, rubs, or gallops  Respiratory:  clear to auscultation bilaterally, normal work of breathing GI: soft, nontender, nondistended, + BS Ext: without cyanosis, clubbing, or edema, Good distal pulses bilaterally MS: no deformity or atrophy  Skin: warm and dry, no rash Neuro:  Alert and Oriented x 3, Strength and sensation are intact Psych: euthymic mood, full affect  Wt Readings from Last 3 Encounters:  12/01/17 116 lb 1.6 oz (52.7 kg)  10/24/17 116 lb 8 oz (52.8 kg)  09/29/17 115 lb 12.8 oz (52.5 kg)      Studies/Labs Reviewed:   EKG:  EKG is*** ordered today.  The ekg ordered today demonstrates ***  Recent Labs: 09/20/2017: ALT 10; BUN 13; Creatinine, Ser 0.81; Hemoglobin 8.6; Platelets 152; Potassium 3.4; Sodium 139    Lipid Panel    Component Value Date/Time   CHOL 220 (H) 06/25/2016 0251   TRIG 44 06/25/2016 0251   HDL 93 06/25/2016 0251   CHOLHDL 2.4 06/25/2016 0251   VLDL 9 06/25/2016 0251   LDLCALC 118 (H) 06/25/2016 0251    Additional studies/ records that were reviewed today include:  2D echo 2017 Study Conclusions   - Left ventricle: The cavity size was normal. Systolic function was   vigorous. The estimated ejection fraction was in the range of 65%   to 70%. Wall motion was normal; there were no regional wall   motion abnormalities. Features are consistent with a pseudonormal   left ventricular filling pattern, with concomitant abnormal   relaxation and increased filling pressure (grade 2 diastolic   dysfunction). Doppler parameters are consistent with high   ventricular filling pressure. - Aortic valve: Moderate diffuse thickening and calcification.   There was moderate regurgitation. - Mitral valve: There was trivial regurgitation. - Pulmonic valve: There was mild regurgitation. - Pulmonary arteries: PA peak pressure: 39 mm Hg (S).    ASSESSMENT:    1. Preoperative clearance   2. Aortic valve insufficiency, etiology of cardiac valve disease unspecified   3. Mixed hyperlipidemia   4. Essential hypertension   5. Hiatal hernia      PLAN:  In order of problems listed above:  Preoperative clearance to undergo repair of failed Nissen procedure from 2015.  Repair of hiatal hernia with possible gastro-plexi and NG tube placement.  Aortic regurgitation on 2D echo 2017  Essential hypertension  Hyperlipidemia    Medication Adjustments/Labs and Tests Ordered: Current medicines are reviewed at length with the patient today.  Concerns regarding medicines are outlined above.  Medication changes, Labs and Tests ordered today are listed in the Patient Instructions below. There are no Patient Instructions on file for this visit.   Signed, Jacolyn Reedy, PA-C  12/27/2017 4:17  PM    [facility] [address] [place], [place], [place]  [address] Phone: [phone]; Fax: [phone]

## 2017-12-27 NOTE — Telephone Encounter (Signed)
Next appt scheduled 9/19 with PCP. 

## 2017-12-28 ENCOUNTER — Ambulatory Visit: Payer: Medicare Other | Admitting: Physician Assistant

## 2017-12-29 ENCOUNTER — Encounter: Payer: Self-pay | Admitting: Physician Assistant

## 2018-01-12 ENCOUNTER — Encounter: Payer: Self-pay | Admitting: Internal Medicine

## 2018-01-12 ENCOUNTER — Ambulatory Visit (INDEPENDENT_AMBULATORY_CARE_PROVIDER_SITE_OTHER): Payer: Medicare Other | Admitting: Internal Medicine

## 2018-01-12 VITALS — BP 186/97 | HR 99 | Temp 98.7°F | Ht 63.0 in | Wt 118.9 lb

## 2018-01-12 DIAGNOSIS — F419 Anxiety disorder, unspecified: Secondary | ICD-10-CM | POA: Diagnosis not present

## 2018-01-12 DIAGNOSIS — R011 Cardiac murmur, unspecified: Secondary | ICD-10-CM

## 2018-01-12 DIAGNOSIS — K449 Diaphragmatic hernia without obstruction or gangrene: Secondary | ICD-10-CM

## 2018-01-12 DIAGNOSIS — F329 Major depressive disorder, single episode, unspecified: Secondary | ICD-10-CM

## 2018-01-12 DIAGNOSIS — I10 Essential (primary) hypertension: Secondary | ICD-10-CM | POA: Diagnosis not present

## 2018-01-12 DIAGNOSIS — G4701 Insomnia due to medical condition: Secondary | ICD-10-CM

## 2018-01-12 DIAGNOSIS — Z79899 Other long term (current) drug therapy: Secondary | ICD-10-CM

## 2018-01-12 MED ORDER — HYDROXYZINE HCL 25 MG PO TABS: 25.0000 mg | ORAL_TABLET | Freq: Every evening | ORAL | 0 refills | Status: DC | PRN

## 2018-01-12 NOTE — Assessment & Plan Note (Signed)
Patient continues to follow with G.I. and surgery. Is planning to proceed with hiatal hernia repair. Surgery has ordered an echocardiogram to evaluate her aortic stenosis. She currently denies syncope, pre-syncope, chest pain, signs or symptoms of heart failure.

## 2018-01-12 NOTE — Assessment & Plan Note (Signed)
Patient currently suffers from generalized anxiety and depression. She was started on sertraline 50 mg once daily and hydroxyzine 25 mg QHS PRN. She took the sertraline for 2 to 3 days and then stopped it because she felt like it was not working. She feels like the hydroxyzine is helping her significantly and has taken it as needed before bedtime on days that she has felt very anxious. We discussed that sertraline can take 4-8 weeks of consistent use for we can assess its effectiveness. She will begin taking it daily and come back in six weeks for reassessment. The prescription for hydroxyzine was sent out. While not ideal given the patient's age, it is helping her to be more functional. We again discussed of the side effects of hydroxyzine including altered mental status, somnolence, and falls. She voices understanding and would like to continue medication.

## 2018-01-12 NOTE — Progress Notes (Signed)
Medicine attending: I personally interviewed and briefly examined this patient on the day of the patient visit and reviewed pertinent clinical ,laboratory data  with resident physician Dr. Justin Helberg and we discussed a management plan. 

## 2018-01-12 NOTE — Progress Notes (Signed)
   CC: Insomnia / Anxiety   HPI:  Ms.Aalani Judie PetitM Raul Dellston is a 84 y.o. female who presented to the clinic for continued evaluation and management of her chronic medical illnesses. For a detailed assessment and plan please refer to problem based charting below.   Past Medical History:  Diagnosis Date  . Anemia 12/14/2012  . Aortic stenosis    Dx ECHO 2008 , mild and aymptomatic , needs ECHO q 3-5 yrs  . Ataxia 06/24/2016  . Auditory hallucinations 08/19/2010   Has has auditory hallucinations, which seem to have worsened since death of her sister Sep 2011. [facility]  for 6 weeks after mother died, also with hallucinations.  B/C of severe mental illness and refusal to see pysch, I have refused to refill controlled meds. If she decides to pursue mental health, then she needs to take the initiative and sch the appt bc [name] has s  . Barrett's esophagus    Demonstrated on EGD 12/2010. EGD 09/17/2012 shows inflamed GE junctional mucosa without metaplasia, dysplasia, or malignancy.  . Bronchitis    hx of  . Cataract   . Chronic anxiety    [facility]  for 6 weeks after mother died. Complicated again by death of her sister 2010. 2012 developed hallucinations and I refused to refill controlled meds unless she see psych which she is not agreable to  . Chronic insomnia   . Chronic pain    DJD knees, back, migranes, LLE varicose veins, and LLE neuropathy.   . Depression   . DVT (deep venous thrombosis) (HCC)   . Elevated cholesterol 10/11   LDL 155. Her 10 year risk (decreasing her age to [age] as the calculator won't go to age 89) is 21% ish. If consider her non smoker (which I wouldn't even though she says 1 pak lasts 2 weeks) risk is 12% ish. So goal for LDL is 130 or 100.  Marland Kitchen. Gastroparesis    Demonstrated on GES 12/2010 by Dr Dalene SeltzerKapland  . GERD (gastroesophageal reflux disease)   . H/O hiatal hernia   . Headache 12/06/2013   Seen by Dr. Noberto Retortonika K. Allena KatzPatel at  [facility]. Started on Nortriptyline 10 mg and Tizanidine 2 mg at bed time  . HTN (hypertension)    Controlled with 2 drug therapy  . Left leg DVT (HCC) 09/28/2012  . Migraine    sometimes  . Muscle spasm 09/29/2017   Review of Systems:  12 point ROS preformed. All negative aside from those mentioned in the HPI.  Physical Exam: Vitals:   01/12/18 1415  BP: (!) 186/97  Pulse: 99  Temp: 98.7 F (37.1 C)  SpO2: 100%  Weight: 118 lb 14.4 oz (53.9 kg)  Height: 5\' 3"  (1.6 m)   General: Well nourished female in no acute distress Pulm: Good air movement with no wheezing or crackles  CV: RRR, crescendo-decrescendo systolic murmur  Abdomen: Soft, non-distended, no tenderness to palpation  Extremities: No LE edema   Assessment & Plan:   See Encounters Tab for problem based charting.  Patient discussed with Dr. Cyndie ChimeGranfortuna

## 2018-01-12 NOTE — Assessment & Plan Note (Signed)
Patients insomnia is significantly improved with the hydroxyzine. She has not been consistently taking the Zoloft as outlined in her anxiety/depression problem.

## 2018-01-12 NOTE — Assessment & Plan Note (Signed)
Patient's blood pressure is elevated to 186/97 today. She is currently on amlodipine 10 mg once daily. She is not experiencing any side effects from this medication including lower extremity edema or constipation. Review of her medical records indicates that this blood pressure is abnormal compared to baseline. She is unsure whether she took her medications today. I personally recheck her blood pressure and it was 168/84. While still not at goal we discussed not adding another medication and reevaluating at her next appointment. Discussed that if she begins to experience a headache, blurred vision, chest pain, shortness of breath to seek medical attention immediately.

## 2018-01-12 NOTE — Patient Instructions (Signed)
Thank you for allowing us to provide your care.   Continue to follow with the stomach doctors and the surgeons.   Please start taking the Sertraline (Zoloft) daily. Only take the Hydroxyzine as needed at bedtime. It can increase your risk for confusion, falls, and drowsiness. Do not take it prior to driving.   I will see you back in 4 weeks.

## 2018-01-19 ENCOUNTER — Telehealth: Payer: Self-pay

## 2018-01-19 NOTE — Telephone Encounter (Signed)
Called pt back - she's on another phone line; informed person who answered the phone, have pt to call us back.

## 2018-01-19 NOTE — Telephone Encounter (Signed)
Calling patient checking on the status, pls call [phone]

## 2018-01-19 NOTE — Telephone Encounter (Signed)
Pt states she continues to have the "pinch nerve". States "something is sticking me all over"; needs something for her nerves; "shaking" all over. Stated Hydroxyzine helped for one day or so; needs something stronger. States 25 mg is not strong enough.  Thanks

## 2018-01-19 NOTE — Telephone Encounter (Signed)
Have attempted to call the patient x 2. Both home and mobile number tried. No answer. Did not leave voicemail. Will try again this afternoon.

## 2018-01-19 NOTE — Telephone Encounter (Signed)
Pt states she's having pinch nerve problem, med is not working. Please call pt back.

## 2018-01-20 ENCOUNTER — Telehealth: Payer: Self-pay | Admitting: *Deleted

## 2018-01-20 NOTE — Telephone Encounter (Signed)
Pt called and is very upset states she needs to talk to you asap.

## 2018-01-20 NOTE — Telephone Encounter (Signed)
Attempted to call the patient again x 2. Phone mailbox is not set up. Will try again this afternoon.

## 2018-01-20 NOTE — Telephone Encounter (Signed)
Attempted to call patient again this afternoon. Unable to get a hold of her.

## 2018-01-23 ENCOUNTER — Telehealth: Payer: Self-pay | Admitting: Internal Medicine

## 2018-01-23 DIAGNOSIS — F419 Anxiety disorder, unspecified: Principal | ICD-10-CM

## 2018-01-23 DIAGNOSIS — F329 Major depressive disorder, single episode, unspecified: Secondary | ICD-10-CM

## 2018-01-23 DIAGNOSIS — G4701 Insomnia due to medical condition: Secondary | ICD-10-CM

## 2018-01-23 MED ORDER — MIRTAZAPINE 15 MG PO TABS: 15.0000 mg | ORAL_TABLET | Freq: Every day | ORAL | 1 refills | Status: DC

## 2018-01-23 MED ORDER — HYDROXYZINE HCL 50 MG PO TABS: 50.0000 mg | ORAL_TABLET | Freq: Every evening | ORAL | 0 refills | Status: DC | PRN

## 2018-01-23 NOTE — Telephone Encounter (Signed)
Talked to pt - informed her doctor had tried calling her. Stated she did not see on her phone if someone from here had called. Stated the best # to call today will be [phone]. Otherwise call her home #.

## 2018-01-23 NOTE — Telephone Encounter (Signed)
Called patient to discuss recent symptoms of "nerves." She described a feeling of needing to move over the past 4-5 days and inability to sleep. The hydroxyzine was initially helping but now has stopped working. On Sunday she experienced a sharp epigastric pain and describes a sensation of impending doom. I reviewed her medications with her and we have formulated the following plan:  - Stop Metoclopramide as it may be causing akathisia  - Increase Hydroxyzine to 50mg  QHS; discussed that this can increase her fall risk and make her sleepy. Discussed that this is not a long term medication and she needs to take her SSRI to treat her underlying anxiety  - Stop Zoloft and switch to Mirtazapine 15 mg QHS   Discussed the importance of taking her medication even if she feels they are not working. Discussed that the Mirtazapine should help with her insomnia.

## 2018-01-24 NOTE — Telephone Encounter (Signed)
See Dr Caron Presume telephone encounter on 9/30.

## 2018-02-13 NOTE — Progress Notes (Signed)
Cardiology Office Note    Date:  02/14/2018   ID:  Sherri Mcmillan, DOB 1932/11/09, MRN [id number]  PCP:  Levora Dredge, MD  Cardiologist: No primary care provider on file. EPS: None  No chief complaint on file.   History of Present Illness:  Sherri Mcmillan is a 84 y.o. female with history of hypertension, moderate aortic regurgitation on echo in 2017 with grade 2 DD and normal LVEF, hyperlipidemia, anxiety.  Last EKG 09/20/2017 sinus bradycardia at 58 bpm otherwise normal.  Patient needs preoperative clearance for laparoscopic takedown and repair where hiatus possible gastro-plexi NG tube placement and is considered high risk surgery.  By Dr. Luretha Murphy.  Patient says she had a cath by Dr. Shana Chute 20 yrs ago that was ok. Now complains of sharp pain from epigastric area into throat, burning. Comes & goes. Yesterday lasted 1 hr until she took a dexilant.Sometimes it takes her breath away and she feels like she may pass out.  Very active, never sits down. Walks 5 blocks. Occasional shortness of breath and has to stop. Quit smoking 23 yrs ago. Mother died of heart problem 32's . Her mothers whole family had heart disease.  LDL was 118 06/25/2016.  Past Medical History:  Diagnosis Date  . Anemia 12/14/2012  . Aortic stenosis    Dx ECHO 2008 , mild and aymptomatic , needs ECHO q 3-5 yrs  . Ataxia 06/24/2016  . Auditory hallucinations 08/19/2010   Has has auditory hallucinations, which seem to have worsened since death of her sister 01-17-2010. Admission to [facility] (90s or early 2000)  for 6 weeks after mother died, also with hallucinations.  B/C of severe mental illness and refusal to see pysch, I have refused to refill controlled meds. If she decides to pursue mental health, then she needs to take the initiative and sch the appt bc [name] has s  . Barrett's esophagus    Demonstrated on EGD 01-18-2011. EGD 09/17/2012 shows inflamed GE junctional mucosa without metaplasia, dysplasia, or malignancy.    . Bronchitis    hx of  . Cataract   . Chronic anxiety    Admission to [facility] (90s or early 2000)  for 6 weeks after mother died. Complicated again by death of her sister 2010. 2012 developed hallucinations and I refused to refill controlled meds unless she see psych which she is not agreable to  . Chronic insomnia   . Chronic pain    DJD knees, back, migranes, LLE varicose veins, and LLE neuropathy.   . Depression   . DVT (deep venous thrombosis) (HCC)   . Elevated cholesterol 10/11   LDL 155. Her 10 year risk (decreasing her age to 48 as the calculator won't go to age 80) is 21% ish. If consider her non smoker (which I wouldn't even though she says 1 pak lasts 2 weeks) risk is 12% ish. So goal for LDL is 130 or 100.  Marland Kitchen Gastroparesis    Demonstrated on GES 01-18-2011 by Dr Dalene Seltzer  . GERD (gastroesophageal reflux disease)   . H/O hiatal hernia   . Headache 12/06/2013   Seen by Dr. Noberto Retort. Allena Katz at [facility]. Started on Nortriptyline 10 mg and Tizanidine 2 mg at bed time  . HTN (hypertension)    Controlled with 2 drug therapy  . Left leg DVT (HCC) 09/28/2012  . Migraine    sometimes  . Muscle spasm 09/29/2017    Past Surgical History:  Procedure Laterality Date  .  24 HOUR PH STUDY N/A 08/24/2017   Procedure: 24 HOUR PH STUDY IMPEDANCE;  Surgeon: Sherrilyn Rist, MD;  Location: [facility] ENDOSCOPY;  Service: Gastroenterology;  Laterality: N/A;  . ABDOMINAL HYSTERECTOMY    . APPENDECTOMY    . CATARACT EXTRACTION     left eye  . COLONOSCOPY  2000&2005   Dr.Magod  . DIRECT LARYNGOSCOPY   September 2008    preoperative diagnosis hoarseness with anterior right vocal cord lesion -  direct laryngoscopy and excisional biopsy of right anterior vocal cord lesion done by Dr. Ezzard Standing  . ENDOVENOUS ABLATION SAPHENOUS VEIN W/ LASER Left 05-09-2014   EVLA left small saphenous vein by Gretta Began MD  . ESOPHAGEAL MANOMETRY N/A 08/24/2017   Procedure: ESOPHAGEAL MANOMETRY (EM);  Surgeon: Sherrilyn Rist, MD;  Location: [facility] ENDOSCOPY;  Service: Gastroenterology;  Laterality: N/A;  . ESOPHAGOGASTRODUODENOSCOPY  11/2010  . ESOPHAGOGASTRODUODENOSCOPY N/A 09/17/2012   Procedure: ESOPHAGOGASTRODUODENOSCOPY (EGD);  Surgeon: Hart Carwin, MD;  Location[facility] ENDOSCOPY;  Service: Endoscopy;  Laterality: N/A;  . ESOPHAGOGASTRODUODENOSCOPY N/A 12/17/2012   Procedure: ESOPHAGOGASTRODUODENOSCOPY (EGD);  Surgeon: Beverley Fiedler, MD;  Location: [facility] ENDOSCOPY;  Service: Gastroenterology;  Laterality: N/A;  . EXCISION MASS HEAD N/A 03/05/2016   Procedure: EXCISION POSTERIOR SCALP MASS;  Surgeon: Axel Filler, MD;  Location: [facility] OR;  Service: General;  Laterality: N/A;  . EYE SURGERY    . HEMORRHOID SURGERY    . KNEE ARTHROSCOPY    . LAPAROSCOPIC NISSEN FUNDOPLICATION N/A 05/02/2013   Procedure: LAPAROSCOPIC NISSEN FUNDOPLICATION;  Surgeon: Valarie Merino, MD;  Location: [facility] ORS;  Service: General;  Laterality: N/A;  . MENISCECTOMY   July 2002    preoperative diagnosis torn medial meniscus right knee, partial medial meniscectomy, debridement chondroplasty patellofemoral joint, done by Dr. Madelon Lips  . POLYPECTOMY  2000   Dr.Magod  . ROTATOR CUFF REPAIR  09/21/2011   rt shoulder    Current Medications: Current Meds  Medication Sig  . amLODipine (NORVASC) 10 MG tablet TAKE 1 TABLET BY MOUTH EVERY DAY  . CARAFATE 1 GM/10ML suspension TAKE 10 MLS (1 G TOTAL) BY MOUTH 2 (TWO) TIMES DAILY.  . CVS MELATONIN 10 MG CAPS TAKE 10 MG BY MOUTH AT BEDTIME.  Marland Kitchen dexlansoprazole (DEXILANT) 60 MG capsule Take 1 capsule (60 mg total) by mouth 2 (two) times daily.  Marland Kitchen Dextromethorphan-guaiFENesin (MUCUS RELIEF DM COUGH) 20-400 MG TABS Take 1 tablet by mouth as needed.  . hydrOXYzine (ATARAX/VISTARIL) 50 MG tablet Take 1 tablet (50 mg total) by mouth at bedtime as needed.  . metoCLOPramide (REGLAN) 10 MG tablet Take 10 mg by mouth daily.  Marland Kitchen PROAIR HFA 108 (90 Base) MCG/ACT inhaler INHALE 2 PUFFS INTO THE LUNGS EVERY 4 (FOUR) HOURS  AS NEEDED FOR WHEEZING OR SHORTNESS OF BREATH.  . ranitidine (ZANTAC) 300 MG tablet Take 300 mg by mouth at bedtime.  . sertraline (ZOLOFT) 50 MG tablet Take 50 mg by mouth at bedtime.     Allergies:   Aspirin   Social History   Socioeconomic History  . Marital status: Single    Spouse name: Not on file  . Number of children: 1  . Years of education: some college  . Highest education level: Not on file  Occupational History  . Occupation: disabled  Social Needs  . Financial resource strain: Hard  . Food insecurity:    Worry: Often true    Inability: Often true  . Transportation needs:    Medical: Yes  Non-medical: No  Tobacco Use  . Smoking status: Former Smoker    Years: 50.00    Types: Cigarettes    Last attempt to quit: 06/01/2010    Years since quitting: 7.7  . Smokeless tobacco: Never Used  Substance and Sexual Activity  . Alcohol use: No    Alcohol/week: 0.0 standard drinks    Comment: quit 24 years ago  . Drug use: No  . Sexual activity: Not Currently    Birth control/protection: Post-menopausal, None  Lifestyle  . Physical activity:    Days per week: Not on file    Minutes per session: Not on file  . Stress: Not on file  Relationships  . Social connections:    Talks on phone: More than three times a week    Gets together: More than three times a week    Attends religious service: More than 4 times per year    Active member of club or organization: Yes    Attends meetings of clubs or organizations: More than 4 times per year    Relationship status: Widowed  Other Topics Concern  . 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     No children.  Education: some college.      Family History:  The patient's family history includes Diabetes in her brother; Heart attack in her father and mother; Heart disease in her mother; Hypertension in her father and mother.   ROS:   Please see the history of present illness.    Review of Systems  Constitution: Negative.  HENT: Negative.   Eyes: Negative.   Cardiovascular: Positive for chest pain and dyspnea on exertion.  Respiratory: Negative.   Hematologic/Lymphatic: Negative.   Musculoskeletal: Positive for back pain. Negative for joint pain.  Gastrointestinal: Negative.   Genitourinary: Negative.   Neurological: Positive for headaches.  Psychiatric/Behavioral: The patient is nervous/anxious.    All other systems reviewed and are negative.   PHYSICAL EXAM:   VS:  BP 112/78   Pulse 66   Ht 5\' 3"  (1.6 m)   Wt 116 lb (52.6 kg)   LMP 04/26/1950   SpO2 96%   BMI 20.55 kg/m   Physical Exam  GEN: Well nourished, well developed, in no acute distress  HEENT: normal  Neck: no JVD, carotid bruits, or masses Cardiac:RRR; 2/6 systolic murmur at the left sternal border, 1/6 diastolic murmur at the left sternal border Respiratory:  clear to auscultation bilaterally, normal work of breathing GI: soft, nontender, nondistended, + BS Ext: without cyanosis, clubbing, or edema, Good distal pulses bilaterally Neuro:  Alert and Oriented x 3, Strength and sensation are intact Psych: euthymic mood, full affect  Wt Readings from Last 3 Encounters:  02/14/18 116 lb (52.6 kg)  01/12/18 118 lb 14.4 oz (53.9 kg)  12/01/17 116 lb 1.6 oz (52.7 kg)      Studies/Labs Reviewed:   EKG:  EKG is ordered today.  The ekg ordered today demonstrates normal sinus rhythm with poor R wave progression anteriorly, no acute change  Recent  Labs: 09/20/2017: ALT 10; BUN 13; Creatinine, Ser 0.81; Hemoglobin 8.6; Platelets 152; Potassium 3.4; Sodium 139   Lipid Panel      Component Value Date/Time   CHOL 220 (H) 06/25/2016 0251   TRIG 44 06/25/2016 0251   HDL 93 06/25/2016 0251   CHOLHDL 2.4 06/25/2016 0251   VLDL 9 06/25/2016 0251   LDLCALC 118 (H) 06/25/2016 0251    Additional studies/ records that were reviewed today include:  2D echo 10/2015 Study Conclusions   - Left ventricle: The cavity size was normal. Systolic function was   vigorous. The estimated ejection fraction was in the range of 65%   to 70%. Wall motion was normal; there were no regional wall   motion abnormalities. Features are consistent with a pseudonormal   left ventricular filling pattern, with concomitant abnormal   relaxation and increased filling pressure (grade 2 diastolic   dysfunction). Doppler parameters are consistent with high   ventricular filling pressure. - Aortic valve: Moderate diffuse thickening and calcification.   There was moderate regurgitation. - Mitral valve: There was trivial regurgitation. - Pulmonic valve: There was mild regurgitation. - Pulmonary arteries: PA peak pressure: 39 mm Hg (S).      ASSESSMENT:    1. Preoperative clearance   2. Aortic valve insufficiency, etiology of cardiac valve disease unspecified   3. Essential hypertension   4. Mixed hyperlipidemia   5. DOE (dyspnea on exertion)      PLAN:  In order of problems listed above:  Preoperative clearance before undergoing hernia repair by Dr. Luretha Murphy.  Felt to be high risk surgery with laparoscopic takedown and repair were hiatus possible gastro-plexi and NG tube placement.  Patient has history of cardiac catheterization by Dr. Donia Guiles approximately 20 years ago.  She thinks it was normal.  Has had no further work-up since then.  Her chest pain is somewhat atypical and most likely related to her hiatal hernia.  She does have multiple CV risk factors including former tobacco abuse, hypertension, HLD, and family history of CAD.  Discussed with Dr. Elberta Fortis, DOD who concurs  that we should proceed with [facility].  We will also do a 2D echo to follow-up her aortic valve murmur.  Follow-up with Dr. Cristal Deer at [facility] office to be established with cardiologist going forward.  Aortic valve insufficiency in 2017 repeat 2D echo.  Essential hypertension blood pressure well controlled on amlodipine 10 mg daily  Hyperlipidemia LDL 118 last year not on statin    Medication Adjustments/Labs and Tests Ordered: Current medicines are reviewed at length with the patient today.  Concerns regarding medicines are outlined above.  Medication changes, Labs and Tests ordered today are listed in the Patient Instructions below. Patient Instructions  Medication Instructions:  Your physician recommends that you continue on your current medications as directed. Please refer to the Current Medication list given to you today.  If you need a refill on your cardiac medications before your next appointment, please call your pharmacy.   Lab work: None ordered If you have labs (blood work) drawn today and your tests are completely normal, you will receive your results only by: Marland Kitchen MyChart Message (if you have MyChart) OR . A paper copy in the mail If you have any lab test that is abnormal or we need to change your treatment, we will call you to review the results.  Testing/Procedures: Your physician has requested that you have an echocardiogram. Echocardiography is a painless test that uses sound  waves to create images of your heart. It provides your doctor with information about the size and shape of your heart and how well your heart's chambers and valves are working. This procedure takes approximately one hour. There are no restrictions for this procedure.  Your physician has requested that you have a lexiscan myoview. For further information please visit [email or web address] Please follow instruction sheet, as given.  Follow-Up: At [facility], you and your health needs  are our priority.  As part of our continuing mission to provide you with exceptional heart care, we have created designated Provider Care Teams.  These Care Teams include your primary Cardiologist (physician) and Advanced Practice Providers (APPs -  Physician Assistants and Nurse Practitioners) who all work together to provide you with the care you need, when you need it. You will need a follow up appointment in the [facility] OFFICE after your testing is complete. Please call our office 2 months in advance to schedule this appointment.  You may see Jodelle Red, MD or one of the following Advanced Practice Providers on your designated Care Team:   Theodore Demark, PA-C . Joni Reining, DNP, ANP  Any Other Special Instructions Will Be Listed Below (If Applicable).       Elson Clan, PA-C  02/14/2018 10:32 AM    [facility] [address] [place], [place], [place]  [address] Phone: [phone]; Fax: [phone]

## 2018-02-14 ENCOUNTER — Encounter: Payer: Self-pay | Admitting: Physician Assistant

## 2018-02-14 ENCOUNTER — Ambulatory Visit (INDEPENDENT_AMBULATORY_CARE_PROVIDER_SITE_OTHER): Payer: Medicare Other | Admitting: Physician Assistant

## 2018-02-14 ENCOUNTER — Other Ambulatory Visit: Payer: Self-pay | Admitting: Internal Medicine

## 2018-02-14 VITALS — BP 112/78 | HR 66 | Ht 63.0 in | Wt 116.0 lb

## 2018-02-14 DIAGNOSIS — F419 Anxiety disorder, unspecified: Secondary | ICD-10-CM

## 2018-02-14 DIAGNOSIS — I351 Nonrheumatic aortic (valve) insufficiency: Secondary | ICD-10-CM | POA: Diagnosis not present

## 2018-02-14 DIAGNOSIS — E782 Mixed hyperlipidemia: Secondary | ICD-10-CM

## 2018-02-14 DIAGNOSIS — F329 Major depressive disorder, single episode, unspecified: Secondary | ICD-10-CM

## 2018-02-14 DIAGNOSIS — Z01818 Encounter for other preprocedural examination: Secondary | ICD-10-CM | POA: Diagnosis not present

## 2018-02-14 DIAGNOSIS — I10 Essential (primary) hypertension: Secondary | ICD-10-CM

## 2018-02-14 DIAGNOSIS — G4701 Insomnia due to medical condition: Secondary | ICD-10-CM

## 2018-02-14 DIAGNOSIS — R0609 Other forms of dyspnea: Secondary | ICD-10-CM

## 2018-02-14 NOTE — Patient Instructions (Signed)
Medication Instructions:  Your physician recommends that you continue on your current medications as directed. Please refer to the Current Medication list given to you today.  If you need a refill on your cardiac medications before your next appointment, please call your pharmacy.   Lab work: None ordered If you have labs (blood work) drawn today and your tests are completely normal, you will receive your results only by: Marland Kitchen MyChart Message (if you have MyChart) OR . A paper copy in the mail If you have any lab test that is abnormal or we need to change your treatment, we will call you to review the results.  Testing/Procedures: Your physician has requested that you have an echocardiogram. Echocardiography is a painless test that uses sound waves to create images of your heart. It provides your doctor with information about the size and shape of your heart and how well your heart's chambers and valves are working. This procedure takes approximately one hour. There are no restrictions for this procedure.  Your physician has requested that you have a lexiscan myoview. For further information please visit [email or web address] Please follow instruction sheet, as given.  Follow-Up: At [facility], you and your health needs are our priority.  As part of our continuing mission to provide you with exceptional heart care, we have created designated Provider Care Teams.  These Care Teams include your primary Cardiologist (physician) and Advanced Practice Providers (APPs -  Physician Assistants and Nurse Practitioners) who all work together to provide you with the care you need, when you need it. You will need a follow up appointment in the [facility] OFFICE after your testing is complete. Please call our office 2 months in advance to schedule this appointment.  You may see Jodelle Red, MD or one of the following Advanced Practice Providers on your designated Care Team:   Theodore Demark,  PA-C . Joni Reining, DNP, ANP  Any Other Special Instructions Will Be Listed Below (If Applicable).

## 2018-02-21 ENCOUNTER — Telehealth: Payer: Self-pay | Admitting: *Deleted

## 2018-02-21 NOTE — Telephone Encounter (Signed)
Called patient with instructions for upcoming appointment.  Reviewed instructions with patient.  She requested that we also call her caregiver [name] to give him instructions as well. Attempted to call patient's caregiver- no answer and unable to leave a message.  Sherri Mcmillan

## 2018-02-24 ENCOUNTER — Ambulatory Visit (HOSPITAL_BASED_OUTPATIENT_CLINIC_OR_DEPARTMENT_OTHER): Payer: Medicare Other

## 2018-02-24 ENCOUNTER — Other Ambulatory Visit: Payer: Self-pay

## 2018-02-24 ENCOUNTER — Ambulatory Visit (HOSPITAL_COMMUNITY): Payer: Medicare Other | Attending: Cardiovascular Disease

## 2018-02-24 DIAGNOSIS — R0609 Other forms of dyspnea: Secondary | ICD-10-CM | POA: Insufficient documentation

## 2018-02-24 DIAGNOSIS — I351 Nonrheumatic aortic (valve) insufficiency: Secondary | ICD-10-CM | POA: Diagnosis not present

## 2018-02-24 LAB — MYOCARDIAL PERFUSION IMAGING
CHL CUP NUCLEAR SRS: 0
CSEPPHR: 88 {beats}/min
LV dias vol: 62 mL (ref 46–106)
LVSYSVOL: 23 mL
Rest HR: 59 {beats}/min
SDS: 0
SSS: 0
TID: 0.94

## 2018-02-24 MED ORDER — TECHNETIUM TC 99M TETROFOSMIN IV KIT
31.3000 | PACK | Freq: Once | INTRAVENOUS | Status: AC | PRN
  Administered 2018-02-24: 31.3 via INTRAVENOUS
  Filled 2018-02-24: qty 32

## 2018-02-24 MED ORDER — REGADENOSON 0.4 MG/5ML IV SOLN
0.4000 mg | Freq: Once | INTRAVENOUS | Status: AC
  Administered 2018-02-24: 0.4 mg via INTRAVENOUS

## 2018-02-24 MED ORDER — TECHNETIUM TC 99M TETROFOSMIN IV KIT
10.1000 | PACK | Freq: Once | INTRAVENOUS | Status: AC | PRN
  Administered 2018-02-24: 10.1 via INTRAVENOUS
  Filled 2018-02-24: qty 11

## 2018-02-25 ENCOUNTER — Other Ambulatory Visit: Payer: Self-pay | Admitting: Internal Medicine

## 2018-02-25 DIAGNOSIS — I10 Essential (primary) hypertension: Secondary | ICD-10-CM

## 2018-02-27 ENCOUNTER — Encounter: Payer: Self-pay | Admitting: Physician Assistant

## 2018-02-28 NOTE — Progress Notes (Signed)
Valve Clinic Consult Note  Chief Complaint  Patient presents with  . New Patient (Initial Visit)    aortic stenosis    History of Present Illness: 84 yo female with history of Barrett's esophagus, anxiety, depression, DVT, GERD, hiatal hernia, hyperlipidemia, HTN and severe aortic stenosis here today for further discussion regarding her aortic stenosis and possible TAVR. She has been seen remotely by Dr. Shana Chute. She reports a normal cardiac catheterization 20 years ago. She is known to have aortic valve disease. Echo in 2017 showed thickened aortic valve leaflets with moderate aortic insufficiency. She was seen in our office 02/14/18 by Jacolyn Reedy, PA-C for pre-operative risk assessment prior to possible hiatal hernia repair with Dr. Luretha Murphy. The patient denies having seen Dr. Daphine Deutscher but primary care notes indicate she saw him on 11/09/17. It is not clear to me or her that any surgery is being planned at this time. She has been seen in [facility] in July 2019. She has been having dyspnea and chest pain. Echo 02/24/18 with normal LV systolic function, LVEF=60-65%. Grade 2 diastolic dysfunction. The aortic valve leaflets are thickened with moderate restriction of leaflet mobility. Mean gradient 40 mmHg, peak gradient 65 mmHg. AVA 0.9 cm2. DVI 0.24. She was referred to valve clinic today to review options for treatment of her aortic stenosis.   She is very active. She walks up to a mile or two per day. She has dyspnea with walking which she describes as "wheezing". This is new for her. Her friend and pastor indicate she is dyspneic with minimal exertion. She has chest pain at rest, mostly around meals and it seems to be relieved with taking her PPI. She has chest tightness with exertion. No lower extremity edema.   She lives alone. She is here today with a friend (caregiver) and her pastor. She is retired. 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 She has some teeth remaining but has  not seen a dentist lately.   Primary Care Physician: Levora Dredge, MD Primary Cardiologist: None She has been seen in our office by Jacolyn Reedy, PA-C on 02/14/18.  Referring: Dr. Caron Presume  Past Medical History:  Diagnosis Date  . Anemia 12/14/2012  . Ataxia 06/24/2016  . Auditory hallucinations 08/19/2010   Has has auditory hallucinations, which seem to have worsened since death of her sister 01-04-10. Admission to [facility] (90s or early 2000)  for 6 weeks after mother died, also with hallucinations.  B/C of severe mental illness and refusal to see pysch, I have refused to refill controlled meds. If she decides to pursue mental health, then she needs to take the initiative and sch the appt bc [name] has s  . Barrett's esophagus    Demonstrated on EGD 05-Jan-2011. EGD 09/17/2012 shows inflamed GE junctional mucosa without metaplasia, dysplasia, or malignancy.  . Cataract   . Chronic anxiety    Admission to [facility] (90s or early 2000)  for 6 weeks after mother died. Complicated again by death of her sister 2010. 2012 developed hallucinations and I refused to refill controlled meds unless she see psych which she is not agreable to  . Chronic insomnia   . Depression   . DVT (deep venous thrombosis) (HCC)   . Gastroparesis    Demonstrated on GES 01-05-2011 by Dr Dalene Seltzer  . GERD (gastroesophageal reflux disease)   . H/O hiatal hernia   . HLD (hyperlipidemia) 01/2010  . HTN (hypertension)    Controlled with 2 drug  therapy  . Left leg DVT (HCC) 09/28/2012  . Migraine   . Muscle spasm 09/29/2017  . Severe aortic stenosis     Past Surgical History:  Procedure Laterality Date  . 24 HOUR PH STUDY N/A 08/24/2017   Procedure: 24 HOUR PH STUDY IMPEDANCE;  Surgeon: Sherrilyn Rist, MD;  Location: [facility] ENDOSCOPY;  Service: Gastroenterology;  Laterality: N/A;  . ABDOMINAL HYSTERECTOMY    . APPENDECTOMY    . CATARACT EXTRACTION     left eye  . COLONOSCOPY  2000&2005   Dr.Magod  . DIRECT LARYNGOSCOPY   September  2008    preoperative diagnosis hoarseness with anterior right vocal cord lesion -  direct laryngoscopy and excisional biopsy of right anterior vocal cord lesion done by Dr. Ezzard Standing  . ENDOVENOUS ABLATION SAPHENOUS VEIN W/ LASER Left 05-09-2014   EVLA left small saphenous vein by Gretta Began MD  . ESOPHAGEAL MANOMETRY N/A 08/24/2017   Procedure: ESOPHAGEAL MANOMETRY (EM);  Surgeon: Sherrilyn Rist, MD;  Location: [facility] ENDOSCOPY;  Service: Gastroenterology;  Laterality: N/A;  . ESOPHAGOGASTRODUODENOSCOPY  11/2010  . ESOPHAGOGASTRODUODENOSCOPY N/A 09/17/2012   Procedure: ESOPHAGOGASTRODUODENOSCOPY (EGD);  Surgeon: Hart Carwin, MD;  Location: [facility] ENDOSCOPY;  Service: Endoscopy;  Laterality: N/A;  . ESOPHAGOGASTRODUODENOSCOPY N/A 12/17/2012   Procedure: ESOPHAGOGASTRODUODENOSCOPY (EGD);  Surgeon: Beverley Fiedler, MD;  Location: [facility] ENDOSCOPY;  Service: Gastroenterology;  Laterality: N/A;  . EXCISION MASS HEAD N/A 03/05/2016   Procedure: EXCISION POSTERIOR SCALP MASS;  Surgeon: Axel Filler, MD;  Location: [facility] OR;  Service: General;  Laterality: N/A;  . EYE SURGERY    . HEMORRHOID SURGERY    . KNEE ARTHROSCOPY    . LAPAROSCOPIC NISSEN FUNDOPLICATION N/A 05/02/2013   Procedure: LAPAROSCOPIC NISSEN FUNDOPLICATION;  Surgeon: Valarie Merino, MD;  Location: [facility] ORS;  Service: General;  Laterality: N/A;  . MENISCECTOMY   July 2002    preoperative diagnosis torn medial meniscus right knee, partial medial meniscectomy, debridement chondroplasty patellofemoral joint, done by Dr. Madelon Lips  . POLYPECTOMY  2000   Dr.Magod  . ROTATOR CUFF REPAIR  09/21/2011   rt shoulder    Current Outpatient Medications  Medication Sig Dispense Refill  . amLODipine (NORVASC) 10 MG tablet TAKE 1 TABLET BY MOUTH EVERY DAY 90 tablet 2  . CARAFATE 1 GM/10ML suspension TAKE 10 MLS (1 G TOTAL) BY MOUTH 2 (TWO) TIMES DAILY. 420 mL 1  . CVS MELATONIN 10 MG CAPS TAKE 10 MG BY MOUTH AT BEDTIME. 90 capsule 1  . dexlansoprazole (DEXILANT) 60  MG capsule Take 1 capsule (60 mg total) by mouth 2 (two) times daily. 180 capsule   . Dextromethorphan-guaiFENesin (MUCUS RELIEF DM COUGH) 20-400 MG TABS Take 1 tablet by mouth as needed.    . metoCLOPramide (REGLAN) 10 MG tablet Take 10 mg by mouth daily.    Marland Kitchen PROAIR HFA 108 (90 Base) MCG/ACT inhaler INHALE 2 PUFFS INTO THE LUNGS EVERY 4 (FOUR) HOURS AS NEEDED FOR WHEEZING OR SHORTNESS OF BREATH. 8.5 Inhaler 3  . ranitidine (ZANTAC) 300 MG tablet Take 300 mg by mouth at bedtime.    . sertraline (ZOLOFT) 50 MG tablet Take 50 mg by mouth at bedtime.     No current facility-administered medications for this visit.     Allergies  Allergen Reactions  . Aspirin Rash    Social History   Socioeconomic History  . Marital status: Single    Spouse name: Not on file  . Number of children: 1  . Years  of education: some college  . Highest education level: Not on file  Occupational History  . Occupation: Retired-worked in [employer]  Social Needs  . Financial resource strain: Hard  . Food insecurity:    Worry: Often true    Inability: Often true  . Transportation needs:    Medical: Yes    Non-medical: No  Tobacco Use  . Smoking status: Former Smoker    Years: 50.00    Types: Cigarettes    Last attempt to quit: 06/02/1983    Years since quitting: 34.7  . Smokeless tobacco: Never Used  Substance and Sexual Activity  . Alcohol use: No    Alcohol/week: 0.0 standard drinks    Comment: quit 24 years ago  . Drug use: No  . Sexual activity: Not Currently    Birth control/protection: Post-menopausal, None  Lifestyle  . Physical activity:    Days per week: Not on file    Minutes per session: Not on file  . Stress: Not on file  Relationships  . Social connections:    Talks on phone: More than three times a week    Gets together: More than three times a week    Attends religious service: More than 4 times per year    Active member of club or organization: Yes    Attends meetings of clubs or  organizations: More than 4 times per year    Relationship status: Widowed  . Intimate partner violence:    Fear of current or ex partner: Not on file    Emotionally abused: Not on file    Physically abused: Not on file    Forced sexual activity: Not on file  Other Topics Concern  . 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     No children.  Education: some college.     Family History  Problem Relation Age of Onset  . Heart disease Mother   . Hypertension Mother   . Heart attack Mother   . Hypertension Father   . Heart attack Father   . Diabetes Brother   . Stroke Neg Hx   . Cancer Neg Hx   . Colon cancer Neg Hx   . Anesthesia problems Neg Hx   . Hypotension Neg Hx   . Malignant hyperthermia Neg Hx   . Pseudochol deficiency Neg Hx   . Stomach cancer Neg Hx   . Rectal cancer Neg Hx   . Esophageal cancer Neg Hx     Review of Systems:  As stated in the HPI and otherwise negative.   BP (!) 168/84   Pulse 67   Ht 5\' 3"  (1.6 m)   Wt 119 lb 12.8 oz (54.3 kg)   LMP 04/26/1950   SpO2 96%   BMI 21.22 kg/m   Physical Examination: General: Well developed, well nourished, NAD  HEENT: OP clear, mucus membranes moist  SKIN: warm, dry. No rashes. Neuro: No focal deficits  Musculoskeletal: Muscle strength 5/5 all ext  Psychiatric: Mood and affect normal  Neck: No JVD, no carotid  bruits, no thyromegaly, no lymphadenopathy.  Lungs:Clear bilaterally, no wheezes, rhonci, crackles Cardiovascular: Regular rate and rhythm. Loud, harsh, late peaking systolic murmur.  Abdomen:Soft. Bowel sounds present. Non-tender.  Extremities:  No lower extremity edema. Pulses are 2 + in the bilateral DP/PT.  Echo 02/24/18: Left ventricle: The cavity size was normal. There was mild   concentric hypertrophy. Systolic function was normal. The   estimated ejection fraction was in the range of 60% to 65%. Wall   motion was normal; there were no regional wall motion   abnormalities. Features are consistent with a pseudonormal left   ventricular filling pattern, with concomitant abnormal relaxation   and increased filling pressure (grade 2 diastolic dysfunction). - Aortic valve: Valve mobility was moderately restricted. There was   moderate to severe stenosis. There was trivial regurgitation.   Calculated aortic valve area and dimensionless obstructive index   overestimate AS severity due to inaccurate LVOT pulsed wave   sampling location, Even so, the aortic stenosis is approaching   the severe range. - Pulmonary arteries: Systolic pressure was mildly increased. PA   peak pressure: 36 mm Hg (S).  Impressions:  - Aortic stenosis has worsened since 11/10/2015 and is approaching   severe range.  Left ventricle:  The cavity size was normal. There was mild concentric hypertrophy. Systolic function was normal. The estimated ejection fraction was in the range of 60% to 65%. Wall motion was normal; there were no regional wall motion abnormalities. Features are consistent with a pseudonormal left ventricular filling pattern, with concomitant abnormal relaxation and increased filling pressure (grade 2 diastolic dysfunction).  ------------------------------------------------------------------- Aortic valve:   Trileaflet; moderately thickened, moderately calcified leaflets. Valve mobility was moderately restricted. Doppler:   There was moderate to severe stenosis.   There was trivial regurgitation.    VTI ratio of LVOT to aortic valve: 0.26. Valve area (VTI): 0.9 cm^2. Indexed valve area (VTI): 0.59 cm^2/m^2. Peak velocity ratio of  LVOT to aortic valve: 0.24. Valve area (Vmax): 0.82 cm^2. Indexed valve area (Vmax): 0.53 cm^2/m^2. Mean velocity ratio of LVOT to aortic valve: 0.23. Valve area (Vmean): 0.8 cm^2. Indexed valve area (Vmean): 0.52 cm^2/m^2. Mean gradient (S): 35 mm Hg. Peak gradient (S): 65 mm Hg.  ------------------------------------------------------------------- Aorta:  Aortic root: The aortic root was normal in size. Ascending aorta: The ascending aorta was normal in size.  ------------------------------------------------------------------- Mitral valve:   Mildly thickened leaflets . Leaflet separation was normal.  Doppler:  Transvalvular velocity was within the normal range. There was no evidence for stenosis. There was trivial regurgitation.    Peak gradient (D): 5 mm Hg.  ------------------------------------------------------------------- Left atrium:  The atrium was at the upper limits of normal in size.   ------------------------------------------------------------------- Right ventricle:  The cavity size was normal. Systolic function was normal.  ------------------------------------------------------------------- Pulmonic valve:   Poorly visualized.  The valve appears to be grossly normal.    Doppler:  There was mild regurgitation.  ------------------------------------------------------------------- Tricuspid valve:   Structurally normal valve.   Leaflet separation was normal.  Doppler:  Transvalvular velocity was within the normal range. There was mild regurgitation.  ------------------------------------------------------------------- Pulmonary artery:   Systolic pressure was mildly increased.  ------------------------------------------------------------------- Right atrium:  The atrium was normal in size.  ------------------------------------------------------------------- Pericardium:  There was no pericardial  effusion.  ------------------------------------------------------------------- Systemic veins: Inferior vena cava: The vessel was normal in size. The respirophasic diameter changes were in the normal range (>= 50%), consistent with normal central venous pressure.  ------------------------------------------------------------------- Measurements   Left ventricle  Value          Reference  LV ID, ED, PLAX chordal          (L)     38    mm       43 - 52  LV ID, ES, PLAX chordal                  24    mm       23 - 38  LV fx shortening, PLAX chordal           37    %        >=29  LV PW thickness, ED                      12    mm       ----------  IVS/LV PW ratio, ED                      1.06           <=1.3  Stroke volume, 2D                        71    ml       ----------  Stroke volume/bsa, 2D                    46    ml/m^2   ----------  LV e&', lateral                           6.85  cm/s     ----------  LV E/e&', lateral                         16.06          ----------  LV e&', medial                            6.96  cm/s     ----------  LV E/e&', medial                          15.8           ----------  LV e&', average                           6.91  cm/s     ----------  LV E/e&', average                         15.93          ----------    Ventricular septum                       Value          Reference  IVS thickness, ED                        12.69 mm       ----------    LVOT  Value          Reference  LVOT ID, S                               21    mm       ----------  LVOT area                                3.46  cm^2     ----------  LVOT ID                                  19    mm       ----------  LVOT peak velocity, S                    94.9  cm/s     ----------  LVOT mean velocity, S                    64.5  cm/s     ----------  LVOT VTI, S                              25    cm       ----------  Stroke  volume (SV), LVOT DP              86.6  ml       ----------  Stroke index (SV/bsa), LVOT DP           56.6  ml/m^2   ----------    Aortic valve                             Value          Reference  Aortic valve peak velocity, S            402   cm/s     ----------  Aortic valve mean velocity, S            279   cm/s     ----------  Aortic valve VTI, S                      96.7  cm       ----------  Aortic mean gradient, S                  35    mm Hg    ----------  Aortic peak gradient, S                  65    mm Hg    ----------  VTI ratio, LVOT/AV                       0.26           ----------  Aortic valve area, VTI                   0.9   cm^2     ----------  Aortic valve area/bsa, VTI               0.59  cm^2/m^2 ----------  Velocity ratio,  peak, LVOT/AV            0.24           ----------  Aortic valve area, peak velocity         0.82  cm^2     ----------  Aortic valve area/bsa, peak              0.53  cm^2/m^2 ----------  velocity  Velocity ratio, mean, LVOT/AV            0.23           ----------  Aortic valve area, mean velocity         0.8   cm^2     ----------  Aortic valve area/bsa, mean              0.52  cm^2/m^2 ----------  velocity    Aorta                                    Value          Reference  Aortic root ID, ED                       29    mm       ----------  Ascending aorta ID, A-P, S               24    mm       ----------    Left atrium                              Value          Reference  LA ID, A-P, ES                           37    mm       ----------  LA ID/bsa, A-P                   (H)     2.42  cm/m^2   <=2.2  LA volume, S                             53.3  ml       ----------  LA volume/bsa, S                         34.9  ml/m^2   ----------  LA volume, ES, 1-p A4C                   49    ml       ----------  LA volume/bsa, ES, 1-p A4C               32.1  ml/m^2   ----------  LA volume, ES, 1-p A2C                   53.6  ml       ----------   LA volume/bsa, ES, 1-p A2C               35.1  ml/m^2   ----------    Mitral valve  Value          Reference  Mitral E-wave peak velocity              110   cm/s     ----------  Mitral A-wave peak velocity              109   cm/s     ----------  Mitral deceleration time                 218   ms       150 - 230  Mitral peak gradient, D                  5     mm Hg    ----------  Mitral E/A ratio, peak                   1              ----------    Pulmonary arteries                       Value          Reference  PA pressure, S, DP               (H)     36    mm Hg    <=30    Tricuspid valve                          Value          Reference  Tricuspid regurg peak velocity           287   cm/s     ----------  Tricuspid peak RV-RA gradient            33    mm Hg    ----------    Right atrium                             Value          Reference  RA ID, S-I, ES, A4C                      35.1  mm       34 - 49  RA area, ES, A4C                         10.7  cm^2     8.3 - 19.5  RA volume, ES, A/L                       27    ml       ----------  RA volume/bsa, ES, A/L                   17.7  ml/m^2   ----------    Systemic veins                           Value          Reference  Estimated CVP                            3     mm  Hg    ----------    Right ventricle                          Value          Reference  RV ID, minor axis, ED, A4C base          36    mm       ----------  TAPSE                                    21.9  mm       ----------  RV pressure, S, DP               (H)     36    mm Hg    <=30  RV s&', lateral, S                        16.4  cm/s     ----------    Pulmonic valve                           Value          Reference  Pulmonic regurg velocity, ED             104   cm/s     ----------  EKG:  EKG is ordered today. The ekg ordered today demonstrates NSR, rate 68 bpm. Poor R wave progression.   Recent Labs: 09/20/2017: ALT 10; BUN 13;  Creatinine, Ser 0.81; Hemoglobin 8.6; Platelets 152; Potassium 3.4; Sodium 139   Lipid Panel    Component Value Date/Time   CHOL 220 (H) 06/25/2016 0251   TRIG 44 06/25/2016 0251   HDL 93 06/25/2016 0251   CHOLHDL 2.4 06/25/2016 0251   VLDL 9 06/25/2016 0251   LDLCALC 118 (H) 06/25/2016 0251     Wt Readings from Last 3 Encounters:  03/01/18 119 lb 12.8 oz (54.3 kg)  02/14/18 116 lb (52.6 kg)  01/12/18 118 lb 14.4 oz (53.9 kg)     Other studies Reviewed: Additional studies/ records that were reviewed today include: Echo images. EKG. Old office notes.  Review of the above records demonstrates: severe AS   Assessment and Plan:   1. Severe aortic stenosis: She has severe, stage D aortic valve stenosis. I have personally reviewed the echo images. The aortic valve is thickened, calcified with limited leaflet mobility. I think she would benefit from AVR. Given advanced age, she is not a good candidate for conventional AVR by surgical approach. I think she may be a good candidate for TAVR.   STS Risk Score:  Procedure: Isolated AVR  Risk of Mortality: 3.284%  Renal Failure: 2.193%  Permanent Stroke: 2.373%  Prolonged Ventilation: 10.341%  DSW Infection: 0.037%  Reoperation: 5.003%  Morbidity or Mortality: 16.527%  Short Length of Stay: 23.290%  Long Length of Stay: 7.561%   I have reviewed the natural history of aortic stenosis with the patient and her friends who are present today. We have discussed the limitations of medical therapy and the poor prognosis associated with symptomatic aortic stenosis. We have reviewed potential treatment options, including palliative medical therapy, conventional surgical aortic valve replacement, and transcatheter aortic valve replacement. We discussed treatment options in the context of the patient's specific comorbid medical  conditions.   She would like to proceed with planning for TAVR. I will arrange a right and left heart catheterization at  [facility] 03/20/18 at 9am. Risks and benefits of the cath procedure and the TAVR procedure reviewed with the patient. After the cath, she will have a cardiac CT, CTA of the chest/abdomen and pelvis, PFTs, carotid artery dopplers, PT assessment and will then be referred to see one of the CT surgeons on our TAVR team. She will also need a dental referral prior to her TAVR.   Current medicines are reviewed at length with the patient today.  The patient does not have concerns regarding medicines.  The following changes have been made:  no change  Labs/ tests ordered today include:   Orders Placed This Encounter  Procedures  . CBC  . Basic Metabolic Panel (BMET)     Disposition:   FU with the valve team.    Signed, Verne Carrow, MD 03/01/2018 5:11 PM    [facility] [address] [place], [place], [place]  [address] Phone: [phone]; Fax: [phone]

## 2018-03-01 ENCOUNTER — Ambulatory Visit (INDEPENDENT_AMBULATORY_CARE_PROVIDER_SITE_OTHER): Payer: Medicare Other | Admitting: Cardiovascular Disease

## 2018-03-01 ENCOUNTER — Encounter: Payer: Self-pay | Admitting: Cardiovascular Disease

## 2018-03-01 VITALS — BP 168/84 | HR 67 | Ht 63.0 in | Wt 119.8 lb

## 2018-03-01 DIAGNOSIS — I350 Nonrheumatic aortic (valve) stenosis: Secondary | ICD-10-CM

## 2018-03-01 NOTE — Patient Instructions (Signed)
Medication Instructions:  Your physician recommends that you continue on your current medications as directed. Please refer to the Current Medication list given to you today.  If you need a refill on your cardiac medications before your next appointment, please call your pharmacy.   Lab work: Lab work to be done today--CBC and BMP If you have labs (blood work) drawn today and your tests are completely normal, you will receive your results only by: Marland Kitchen MyChart Message (if you have MyChart) OR . A paper copy in the mail If you have any lab test that is abnormal or we need to change your treatment, we will call you to review the results.  Testing/Procedures: Your physician has requested that you have a cardiac catheterization. Cardiac catheterization is used to diagnose and/or treat various heart conditions. Doctors may recommend this procedure for a number of different reasons. The most common reason is to evaluate chest pain. Chest pain can be a symptom of coronary artery disease (CAD), and cardiac catheterization can show whether plaque is narrowing or blocking your heart's arteries. This procedure is also used to evaluate the valves, as well as measure the blood flow and oxygen levels in different parts of your heart. For further information please visit [email or web address] Please follow instruction sheet, as given.  Scheduled for November 25,2019  Follow-Up: To be arranged after catheterization.      [facility] CARDIOVASCULAR DIVISION [facility] OFFICE [address] [place], [address] [place] [address] Dept: [phone] Loc: [phone]  Sherri Mcmillan  03/01/2018  You are scheduled for a Cardiac Catheterization on Monday, November 25 with Dr. Verne Carrow.  1. Please arrive at the [facility] (Main Entrance A) at [facility]: [address] [place], [place] [address] at 7:00 AM (This time is two hours before your procedure  to ensure your preparation). Free valet parking service is available.   Special note: Every effort is made to have your procedure done on time. Please understand that emergencies sometimes delay scheduled procedures.  2. Diet: Do not eat solid foods after midnight.  The patient may have clear liquids until 5am upon the day of the procedure.  3. Labs: Lab work was done in office on November 6,2019   4. Medication instructions in preparation for your procedure:   Contrast Allergy: No   *  On the morning of your procedure, take all your morning medicines.  You may use sips of water.  5. Plan for one night stay--bring personal belongings. 6. Bring a current list of your medications and current insurance cards. 7. You MUST have a responsible person to drive you home. 8. Someone MUST be with you the first 24 hours after you arrive home or your discharge will be delayed. 9. Please wear clothes that are easy to get on and off and wear slip-on shoes.  Thank you for allowing [place] to care for you!   -- [facility]

## 2018-03-02 ENCOUNTER — Other Ambulatory Visit: Payer: Self-pay

## 2018-03-02 DIAGNOSIS — I350 Nonrheumatic aortic (valve) stenosis: Secondary | ICD-10-CM

## 2018-03-02 LAB — BASIC METABOLIC PANEL
BUN/Creatinine Ratio: 15 (ref 12–28)
BUN: 13 mg/dL (ref 8–27)
CALCIUM: 9.1 mg/dL (ref 8.7–10.3)
CO2: 24 mmol/L (ref 20–29)
Chloride: 104 mmol/L (ref 96–106)
Creatinine, Ser: 0.84 mg/dL (ref 0.57–1.00)
GFR calc Af Amer: 74 mL/min/{1.73_m2} (ref >59)
GFR, EST NON AFRICAN AMERICAN: 64 mL/min/{1.73_m2} (ref >59)
Glucose: 84 mg/dL (ref 65–99)
Potassium: 3.6 mmol/L (ref 3.5–5.2)
Sodium: 141 mmol/L (ref 134–144)

## 2018-03-02 LAB — CBC
Hematocrit: 26.8 % — ABNORMAL LOW (ref 34.0–46.6)
Hemoglobin: 7.6 g/dL — ABNORMAL LOW (ref 11.1–15.9)
MCH: 20.1 pg — ABNORMAL LOW (ref 26.6–33.0)
MCHC: 28.4 g/dL — ABNORMAL LOW (ref 31.5–35.7)
MCV: 71 fL — ABNORMAL LOW (ref 79–97)
Platelets: 195 10*3/uL (ref 150–450)
RBC: 3.78 x10E6/uL (ref 3.77–5.28)
RDW: 17 % — ABNORMAL HIGH (ref 12.3–15.4)
WBC: 3.9 10*3/uL (ref 3.4–10.8)

## 2018-03-02 NOTE — Progress Notes (Signed)
Order placed for Referral to Dr Kristin Bruins.

## 2018-03-02 NOTE — Addendum Note (Signed)
Addended by: ,  S on: 03/02/2018 03:08 PM   Modules accepted: Orders  

## 2018-03-04 IMAGING — CR DG CHEST 2V
2 series · 2 of 2 positions shown · non-contrast
Comparison: 06/15/2016, 05/10/2016, CT 07/08/2015

CLINICAL DATA: 84-year-old female with a history of recurrent
shortness of breath

EXAM:
CHEST  2 VIEW

[chest pa]
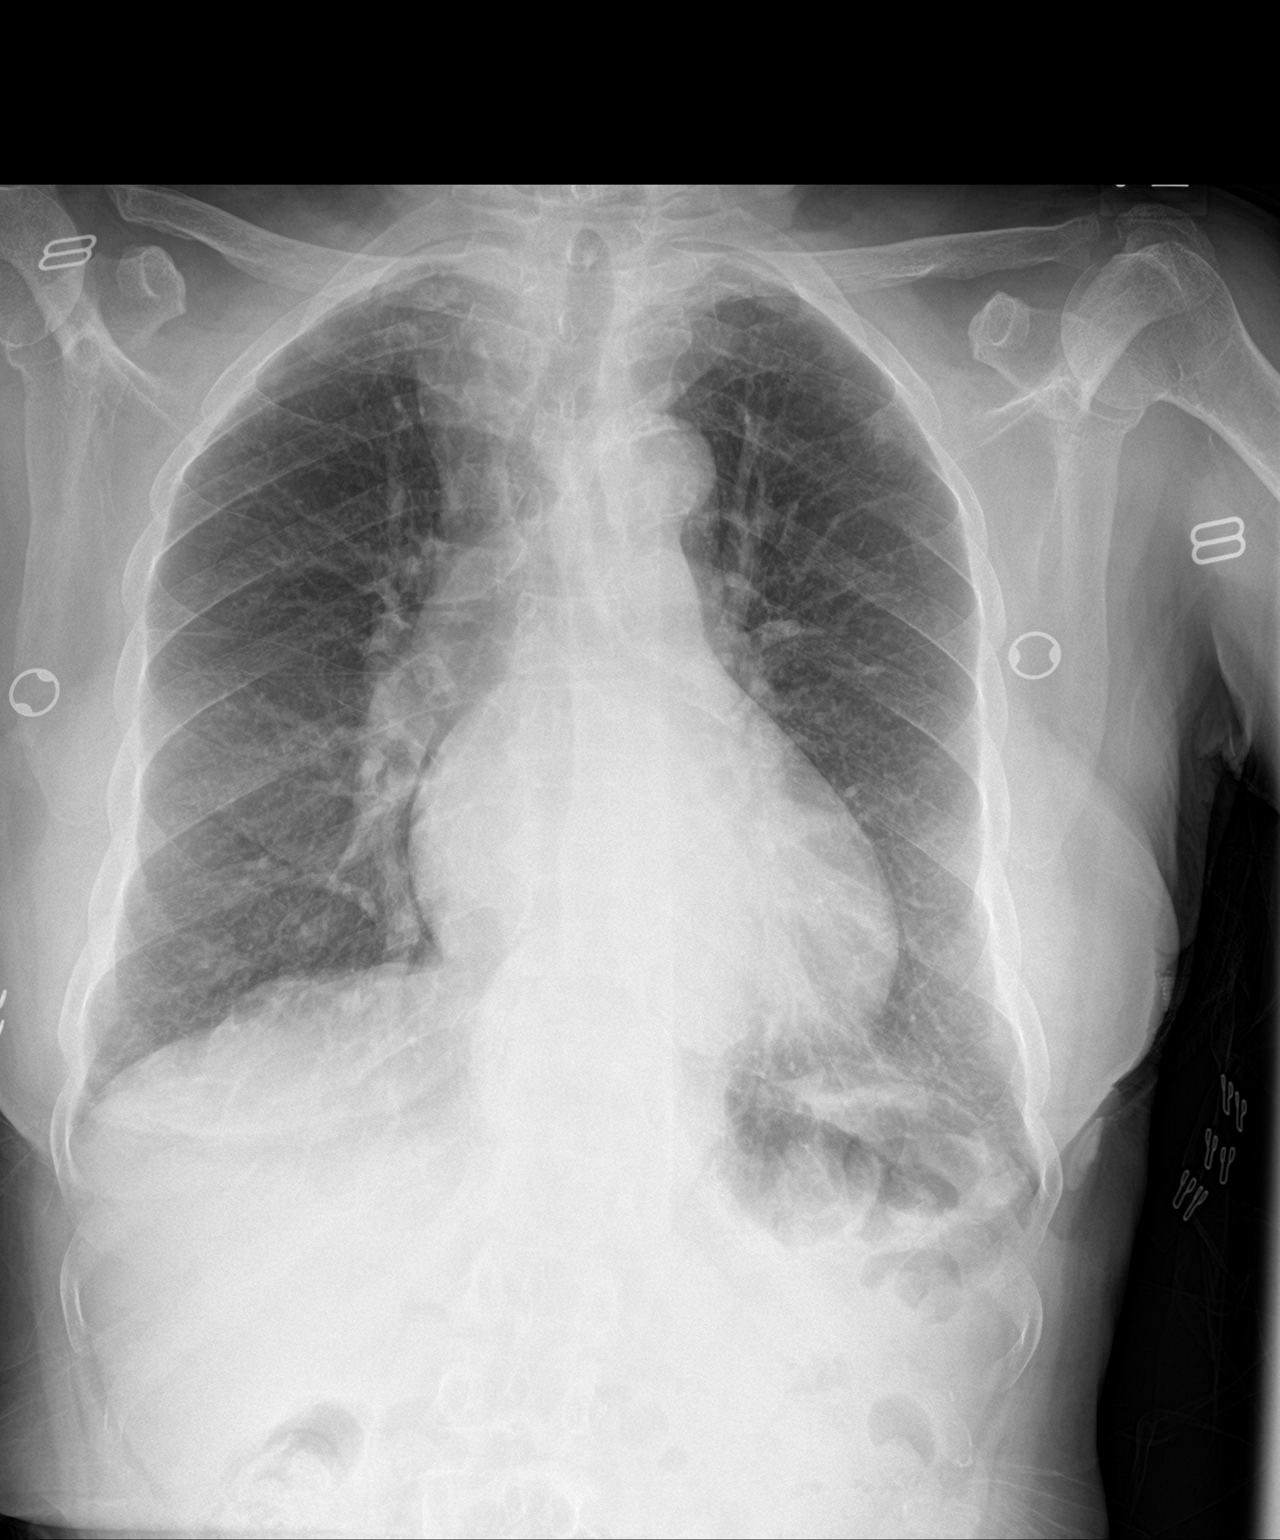

[chest lat]
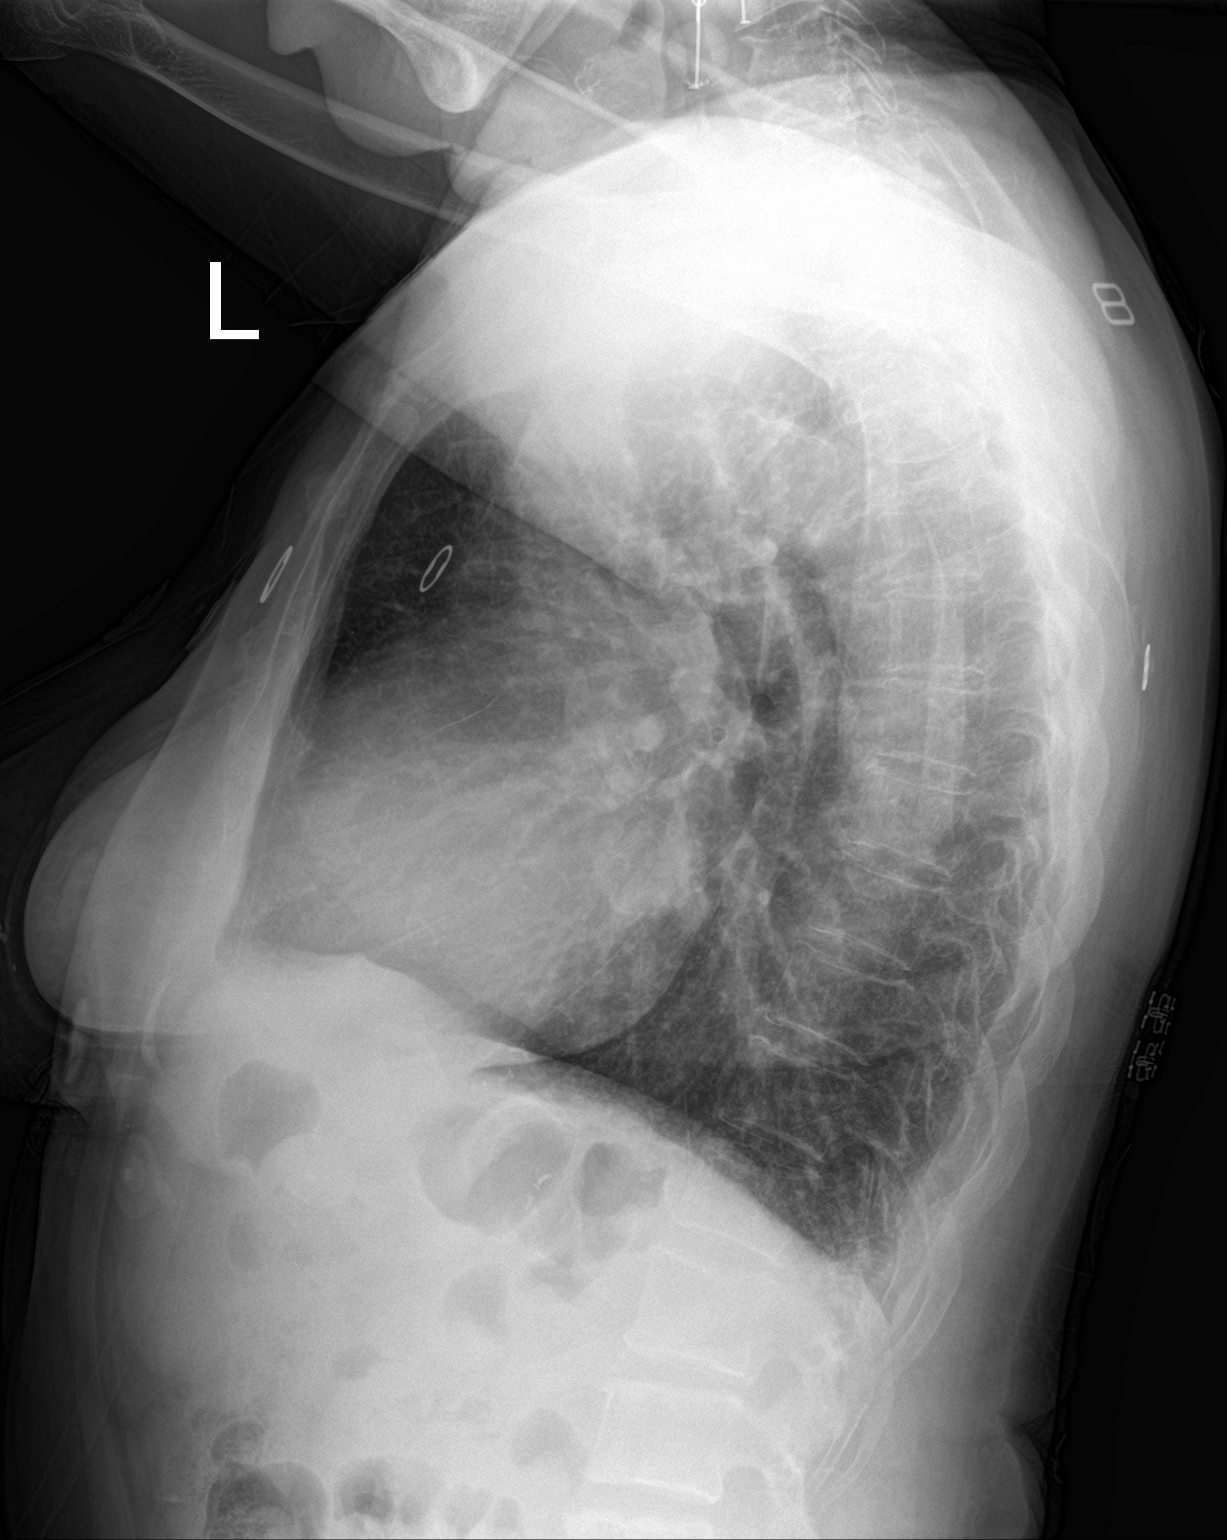

[2 of 2 positions shown; findings below may reference images not displayed]

FINDINGS: Cardiomediastinal silhouette unchanged in size and contour.
Calcifications of the aortic arch.

Double density in the lower mediastinum, unchanged from comparison
studies compatible with known hernia.

Unchanged configuration of the upper mediastinum.

Calcifications of the aortic arch.

No pneumothorax.  No pleural effusion.

Coarsened interstitial markings. No confluent airspace disease. No
interlobular septal thickening or central vascular congestion.

Degenerative changes of the bilateral acromioclavicular joints.

Degenerative changes of the spine.
IMPRESSION: Chronic lung changes without evidence of superimposed acute
cardiopulmonary disease.

Hiatal hernia

## 2018-03-06 ENCOUNTER — Encounter (HOSPITAL_COMMUNITY): Payer: Self-pay | Admitting: Dentistry

## 2018-03-06 ENCOUNTER — Ambulatory Visit (HOSPITAL_COMMUNITY): Payer: Self-pay | Admitting: Dentistry

## 2018-03-06 VITALS — BP 176/96 | HR 78 | Temp 98.3°F

## 2018-03-06 DIAGNOSIS — F40232 Fear of other medical care: Secondary | ICD-10-CM

## 2018-03-06 DIAGNOSIS — K0889 Other specified disorders of teeth and supporting structures: Secondary | ICD-10-CM

## 2018-03-06 DIAGNOSIS — K036 Deposits [accretions] on teeth: Secondary | ICD-10-CM

## 2018-03-06 DIAGNOSIS — K06010 Localized gingival recession, unspecified: Secondary | ICD-10-CM

## 2018-03-06 DIAGNOSIS — K083 Retained dental root: Secondary | ICD-10-CM

## 2018-03-06 DIAGNOSIS — K0530 Chronic periodontitis, unspecified: Secondary | ICD-10-CM

## 2018-03-06 DIAGNOSIS — Z01818 Encounter for other preprocedural examination: Secondary | ICD-10-CM

## 2018-03-06 DIAGNOSIS — K08409 Partial loss of teeth, unspecified cause, unspecified class: Secondary | ICD-10-CM

## 2018-03-06 DIAGNOSIS — K029 Dental caries, unspecified: Secondary | ICD-10-CM

## 2018-03-06 DIAGNOSIS — M2632 Excessive spacing of fully erupted teeth: Secondary | ICD-10-CM

## 2018-03-06 DIAGNOSIS — M264 Malocclusion, unspecified: Secondary | ICD-10-CM

## 2018-03-06 DIAGNOSIS — I350 Nonrheumatic aortic (valve) stenosis: Secondary | ICD-10-CM | POA: Insufficient documentation

## 2018-03-06 NOTE — Patient Instructions (Signed)
Patient is to be scheduled for the extraction of tooth numbers 6 and 29 with alveoloplasty and gross debridement of remaining dentition in the operating room with general anesthesia once patient is cleared medically by Dr. Clifton James for these procedures. Patient is currently being evaluated for her anemia and is pending clearance by Dr. Clifton James for the dental procedures. Dr. Clifton James may also proceed with cardiac catheterization before the dental procedures as well. Patient is a follow-up with the general dentist of her choice for fabrication of a maxillary partial denture after adequate healing for the dental extractions and once medically stable from the anticipated T AVR procedure.  Dr. Kristin Bruins

## 2018-03-06 NOTE — Progress Notes (Signed)
Her last EGD was in 2018 and last colonoscopy 2012.  MCV low with the hemoglobin 7.6, so possibly iron deficient.  Iron studies should be checked if not done so already, then arrangements made for her to have IV iron and/or blood before cardiac surgery.  Patient's with large hiatal hernia often develop multiple erosions within the hernia, leading to IDA.  We must hold off any consideration of repeat endoscopic workup of anemia until TAVR recovery.  Sherilyn Cooter Danis,MD [facility]

## 2018-03-06 NOTE — Progress Notes (Signed)
DENTAL CONSULTATION  Date of Consultation:  03/06/2018 Patient Name:   Sherri Mcmillan Date of Birth:   April 13, 1933 Medical Record Number: [id number]  VITALS: BP (!) 176/96 (BP Location: Right Arm)   Pulse 78   Temp 98.3 F (36.8 C)   LMP 04/26/1950   CHIEF COMPLAINT: Patient is referred by Dr. Clifton James for a dental consultation.  HPI: Sherri Mcmillan is an 84 year old female recently diagnosed with severe aortic stenosis. Patient with anticipated TAVR procedure. Patient is now seen as part of a medically necessary pre-heart valve surgery dental protocol examination to rule out dental infection that may affect the patient's systemic health and anticipated heart valve surgery.  The patient currently denies acute toothaches, swellings, or abscesses. Patient indicates that she was seen by a Dentist approximately 4 - 5 months ago for a dental extraction and evaluation for a maxillary partial denture. The patient denies any complications from that dental extraction. The patient did not proceed with partial denture fabrication due to lack of dental insurance coverage. Patient denies having any partial dentures at this time. The patient did have a previous maxillary partial denture that she has misplaced 3-4 years ago. The patient denies ever having had a lower partial denture. The patient does have a history of dental phobia.  PROBLEM LIST: Patient Active Problem List   Diagnosis Date Noted  . Severe aortic stenosis 03/06/2018    Priority: High  . Preoperative clearance 12/27/2017  . Aortic regurgitation 12/27/2017  . Drug overdose 09/20/2017  . Vitamin D deficiency 06/30/2016  . Aortic atherosclerosis (HCC) 06/24/2016  . Iron deficiency anemia due to chronic blood loss 06/24/2016  . Abnormal finding on MRI of brain 06/24/2016  . Chronic bronchitis (HCC) 08/14/2015  . Allergic rhinitis 07/03/2015  . Osteoporosis 02/19/2014  . Pulmonary nodule 02/10/2014  . S/P Nissen fundoplication  (without gastrostomy tube) procedure 05/02/2013  . Hiatal hernia 02/19/2013  . GERD (gastroesophageal reflux disease) 08/03/2010  . Hyperlipidemia   . Anxiety and depression   . HTN (hypertension)   . Aortic stenosis   . Epidermiod Cyst 03/12/2008  . Insomnia 07/27/2006  . Chronic venous insufficiency 07/06/2006    PMH: Past Medical History:  Diagnosis Date  . Anemia 12/14/2012  . Ataxia 06/24/2016  . Auditory hallucinations 08/19/2010   Has has auditory hallucinations, which seem to have worsened since death of her sister 01-11-10. Admission to [facility] (90s or early 2000)  for 6 weeks after mother died, also with hallucinations.  B/C of severe mental illness and refusal to see pysch, I have refused to refill controlled meds. If she decides to pursue mental health, then she needs to take the initiative and sch the appt bc [name] has s  . Barrett's esophagus    Demonstrated on EGD January 12, 2011. EGD 09/17/2012 shows inflamed GE junctional mucosa without metaplasia, dysplasia, or malignancy.  . Cataract   . Chronic anxiety    Admission to [facility] (90s or early 2000)  for 6 weeks after mother died. Complicated again by death of her sister 2010. 2012 developed hallucinations and I refused to refill controlled meds unless she see psych which she is not agreable to  . Chronic insomnia   . Depression   . DVT (deep venous thrombosis) (HCC)   . Gastroparesis    Demonstrated on GES 01/12/11 by Dr Dalene Seltzer  . GERD (gastroesophageal reflux disease)   . H/O hiatal hernia   . HLD (hyperlipidemia) 01/2010  . HTN (hypertension)    Controlled  with 2 drug therapy  . Left leg DVT (HCC) 09/28/2012  . Migraine   . Muscle spasm 09/29/2017  . Severe aortic stenosis     PSH: Past Surgical History:  Procedure Laterality Date  . 24 HOUR PH STUDY N/A 08/24/2017   Procedure: 24 HOUR PH STUDY IMPEDANCE;  Surgeon: Sherrilyn Rist, MD;  Location: [facility] ENDOSCOPY;  Service: Gastroenterology;  Laterality: N/A;  . ABDOMINAL  HYSTERECTOMY    . APPENDECTOMY    . CATARACT EXTRACTION     left eye  . COLONOSCOPY  2000&2005   Dr.Magod  . DIRECT LARYNGOSCOPY   September 2008    preoperative diagnosis hoarseness with anterior right vocal cord lesion -  direct laryngoscopy and excisional biopsy of right anterior vocal cord lesion done by Dr. Ezzard Standing  . ENDOVENOUS ABLATION SAPHENOUS VEIN W/ LASER Left 05-09-2014   EVLA left small saphenous vein by Gretta Began MD  . ESOPHAGEAL MANOMETRY N/A 08/24/2017   Procedure: ESOPHAGEAL MANOMETRY (EM);  Surgeon: Sherrilyn Rist, MD;  Location: [facility] ENDOSCOPY;  Service: Gastroenterology;  Laterality: N/A;  . ESOPHAGOGASTRODUODENOSCOPY  11/2010  . ESOPHAGOGASTRODUODENOSCOPY N/A 09/17/2012   Procedure: ESOPHAGOGASTRODUODENOSCOPY (EGD);  Surgeon: Hart Carwin, MD;  Location: [facility] ENDOSCOPY;  Service: Endoscopy;  Laterality: N/A;  . ESOPHAGOGASTRODUODENOSCOPY N/A 12/17/2012   Procedure: ESOPHAGOGASTRODUODENOSCOPY (EGD);  Surgeon: Beverley Fiedler, MD;  Location: [facility] ENDOSCOPY;  Service: Gastroenterology;  Laterality: N/A;  . EXCISION MASS HEAD N/A 03/05/2016   Procedure: EXCISION POSTERIOR SCALP MASS;  Surgeon: Axel Filler, MD;  Location: [facility] OR;  Service: General;  Laterality: N/A;  . EYE SURGERY    . HEMORRHOID SURGERY    . KNEE ARTHROSCOPY    . LAPAROSCOPIC NISSEN FUNDOPLICATION N/A 05/02/2013   Procedure: LAPAROSCOPIC NISSEN FUNDOPLICATION;  Surgeon: Valarie Merino, MD;  Location: [facility] ORS;  Service: General;  Laterality: N/A;  . MENISCECTOMY   July 2002    preoperative diagnosis torn medial meniscus right knee, partial medial meniscectomy, debridement chondroplasty patellofemoral joint, done by Dr. Madelon Lips  . POLYPECTOMY  2000   Dr.Magod  . ROTATOR CUFF REPAIR  09/21/2011   rt shoulder    ALLERGIES: Allergies  Allergen Reactions  . Aspirin Rash    MEDICATIONS: Current Outpatient Medications  Medication Sig Dispense Refill  . amLODipine (NORVASC) 10 MG tablet TAKE 1 TABLET BY MOUTH  EVERY DAY 90 tablet 2  . CARAFATE 1 GM/10ML suspension TAKE 10 MLS (1 G TOTAL) BY MOUTH 2 (TWO) TIMES DAILY. 420 mL 1  . [employer] MELATONIN 10 MG CAPS TAKE 10 MG BY MOUTH AT BEDTIME. 90 capsule 1  . dexlansoprazole (DEXILANT) 60 MG capsule Take 1 capsule (60 mg total) by mouth 2 (two) times daily. 180 capsule   . Dextromethorphan-guaiFENesin (MUCUS RELIEF DM COUGH) 20-400 MG TABS Take 1 tablet by mouth as needed.    . metoCLOPramide (REGLAN) 10 MG tablet Take 10 mg by mouth daily.    Marland Kitchen PROAIR HFA 108 (90 Base) MCG/ACT inhaler INHALE 2 PUFFS INTO THE LUNGS EVERY 4 (FOUR) HOURS AS NEEDED FOR WHEEZING OR SHORTNESS OF BREATH. 8.5 Inhaler 3  . ranitidine (ZANTAC) 300 MG tablet Take 300 mg by mouth at bedtime.    . sertraline (ZOLOFT) 50 MG tablet Take 50 mg by mouth at bedtime.     No current facility-administered medications for this visit.     LABS: Lab Results  Component Value Date   WBC 3.9 03/01/2018   HGB 7.6 (L) 03/01/2018   HCT 26.8 (  L) 03/01/2018   MCV 71 (L) 03/01/2018   PLT 195 03/01/2018      Component Value Date/Time   NA 141 03/01/2018 1659   K 3.6 03/01/2018 1659   CL 104 03/01/2018 1659   CO2 24 03/01/2018 1659   GLUCOSE 84 03/01/2018 1659   GLUCOSE 109 (H) 09/20/2017 1824   BUN 13 03/01/2018 1659   CREATININE 0.84 03/01/2018 1659   CREATININE 0.83 04/10/2014 1426   CALCIUM 9.1 03/01/2018 1659   GFRNONAA 64 03/01/2018 1659   GFRNONAA 67 04/10/2014 1426   GFRAA 74 03/01/2018 1659   GFRAA 78 04/10/2014 1426   Lab Results  Component Value Date   INR 1.03 09/20/2017   INR 1.09 06/24/2016   INR 1.10 06/24/2016   No results found for: PTT  SOCIAL HISTORY: Social History   Socioeconomic History  . Marital status: Widowed    Spouse name: Not on file  . Number of children: 1  . Years of education: some college  . Highest education level: Not on file  Occupational History  . Occupation: Retired-worked in [employer]  Social Needs  . Financial resource strain: Hard   . Food insecurity:    Worry: Often true    Inability: Often true  . Transportation needs:    Medical: Yes    Non-medical: No  Tobacco Use  . Smoking status: Former Smoker    Years: 50.00    Types: Cigarettes    Last attempt to quit: 06/02/1983    Years since quitting: 34.7  . Smokeless tobacco: Never Used  Substance and Sexual Activity  . Alcohol use: No    Alcohol/week: 0.0 standard drinks    Comment: quit 24 years ago  . Drug use: No  . Sexual activity: Not Currently    Birth control/protection: Post-menopausal, None  Lifestyle  . Physical activity:    Days per week: Not on file    Minutes per session: Not on file  . Stress: Not on file  Relationships  . Social connections:    Talks on phone: More than three times a week    Gets together: More than three times a week    Attends religious service: More than 4 times per year    Active member of club or organization: Yes    Attends meetings of clubs or organizations: More than 4 times per year    Relationship status: Widowed  . Intimate partner violence:    Fear of current or ex partner: Not on file    Emotionally abused: Not on file    Physically abused: Not on file    Forced sexual activity: Not on file  Other Topics Concern  . 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 The patient nolonger drinks alcohol.   The patient is widowed.   Patient has an adopted daughter living in [place].   Education: some college.       FAMILY HISTORY: Family History  Problem Relation Age of Onset  . Heart disease Mother   . Hypertension Mother   . Heart attack Mother   . Hypertension Father   .  Heart attack  Father   . Diabetes Brother   . Stroke Neg Hx   . Cancer Neg Hx   . Colon cancer Neg Hx   . Anesthesia problems Neg Hx   . Hypotension Neg Hx   . Malignant hyperthermia Neg Hx   . Pseudochol deficiency Neg Hx   . Stomach cancer Neg Hx   . Rectal cancer Neg Hx   . Esophageal cancer Neg Hx     REVIEW OF SYSTEMS: Reviewed with the patient as per History of present illness. Psych: Patient denies having dental phobia.  DENTAL HISTORY: CHIEF COMPLAINT: Patient is referred by Dr. Clifton James for a dental consultation.  HPI: Sherri Mcmillan is an 84 year old female recently diagnosed with severe aortic stenosis. Patient with anticipated TAVR procedure. Patient is now seen as part of a medically necessary pre-heart valve surgery dental protocol examination to rule out dental infection that may affect the patient's systemic health and anticipated heart valve surgery.  The patient currently denies acute toothaches, swellings, or abscesses. Patient indicates that she was seen by a Dentist approximately 4 - 5 months ago for a dental extraction and evaluation for a maxillary partial denture. The patient denies any complications from that dental extraction. The patient did not proceed with partial denture fabrication due to lack of dental insurance coverage. Patient denies having any partial dentures at this time. The patient did have a previous maxillary partial denture that she has misplaced 3-4 years ago. The patient denies ever having had a lower partial denture. The patient does have a history of dental phobia.  DENTAL EXAMINATION: GENERAL:  The patient is a well-developed, well-nourished female in no acute distress. HEAD AND NECK:  There is no palpable neck lymphadenopathy. The patient denies acute TMJ symptoms INTRAORAL EXAM:  Patient has normal saliva. There is no evidence of oral abscess formation. There is a defect involving the alveolar ridge in the area of #15 from previous dental extraction  -remote. DENTITION:  Patient with multiple missing teeth numbers 1,3,5,7-10, 12-16, 18-20, 28,30-32. There is retained root segment in the area of #29. PERIODONTAL:  The patient has chronic periodontitis with plaque and calculus accumulations, generalized gingival recession, and moderate bone loss. Tooth mobility as per dental charting form. Tooth #6 with significant mobility which decreases the prognosis for its use with partial denture fabrication. DENTAL CARIES/SUBOPTIMAL RESTORATIONS:  Dental caries are noted as per dental charting form. ENDODONTIC:  The patient currently denies acute pulpitis symptoms. There is a previous root canal therapy associated with tooth #24. There are mixed radiolucent/radiopaque areas in the periapical area of the mandible anterior teeth. This is consistent with periapical cementomas. Electric pulp testing of tooth numbers 25, 26, 27 were normal. CROWN AND BRIDGE:  There are no crown or bridge restorations noted. PROSTHODONTIC: Patient denies having partial dentures. Patient indicates that she did have a maxillary partial denture that she misplaced 3-4 years ago.  Patient was recently evaluated for a maxillary partial denture approximately 4 months ago.  Patient did not follow-up with that partial denture fabrication due to lack of insurance. OCCLUSION: The patient has a poor occlusal scheme secondary to multiple missing teeth, retained root segment, multiple diastemas, and lack of replacement of missing teeth with dental prostheses.  RADIOGRAPHIC INTERPRETATION: An orthopantogram was taken. This is suboptimal secondary to patient movement. This was supplemented with 12 periapical radiographs. There are multiple missing teeth. There is a retained root segment #29. There is supra-eruption and drifting of the unopposed teeth into  the edentulous areas. Multiple diastemas are noted. There is moderate bone loss. Radiographic calculus is noted. There is a previous root canal  therapy associated with tooth #24. There are mixed radiolucent/radiopaque areas involving the periapical region of the mandibular anterior teeth consistent with periapical cementomas.  ASSESSMENTS: 1. Severe aortic stenosis 2. Pre-heart valve surgery dental protocol 3. Retained root segment  4. Chronic periodontitis of bone loss 5. Generalized gingival recession 6. Accretions 7. Tooth mobility 8. Multiple missing teeth 9. Supra-eruption and drifting of the edentulous areas 10. Multiple diastemas 11. No current partial dentures 12. Poor occlusal scheme and malocclusion 13. Dental phobia   PLAN/RECOMMENDATIONS: 1. I discussed the risks, benefits, and complications of various treatment options with the patient in relationship to her medical and dental conditions, anticipated TAVR procedure, and risk for endocarditis. We discussed various treatment options to include no treatment, multiple extractions with alveoloplasty, pre-prosthetic surgery as indicated, periodontal therapy, dental restorations, root canal therapy, crown and bridge therapy, implant therapy, and replacement of missing teeth as indicated. The patient currently wishes to proceed with extraction tooth numbers 6 and 29 with alveoloplasty and gross debridement of remaining teeth in the operating room with general anesthesia. (Please note: The patient refused extraction of remaining maxillary teeth at this time with subsequent fabrication of an upper complete denture by the dentist of her choice.)This will be scheduled after the patient is evaluated for her anemia and is cleared by Dr. Clifton James for the anticipated dental procedures in the operating room with general anesthesia.  The patient will then proceed with the heart valve surgery with Dr. Clifton James as indicated. Patient will then follow the dentist of her choice for fabrication of the maxillary and mandibular partial denture after adequate healing and once cleared medically by Dr.  Clifton James.  2. Discussion of findings with medical team and coordination of future medical and dental care as needed.  I spent in excess of  120 minutes during the conduct of this consultation and >50% of this time involved direct face-to-face encounter for counseling and/or coordination of the patient's care.    Charlynne Pander, DDS

## 2018-03-09 ENCOUNTER — Encounter: Payer: Self-pay | Admitting: Internal Medicine

## 2018-03-09 ENCOUNTER — Ambulatory Visit (INDEPENDENT_AMBULATORY_CARE_PROVIDER_SITE_OTHER): Payer: Medicare Other | Admitting: Internal Medicine

## 2018-03-09 ENCOUNTER — Other Ambulatory Visit: Payer: Self-pay | Admitting: Internal Medicine

## 2018-03-09 ENCOUNTER — Other Ambulatory Visit: Payer: Self-pay

## 2018-03-09 VITALS — BP 196/97 | HR 77 | Temp 97.8°F | Ht 63.0 in | Wt 126.3 lb

## 2018-03-09 DIAGNOSIS — K449 Diaphragmatic hernia without obstruction or gangrene: Secondary | ICD-10-CM | POA: Diagnosis not present

## 2018-03-09 DIAGNOSIS — D500 Iron deficiency anemia secondary to blood loss (chronic): Secondary | ICD-10-CM

## 2018-03-09 DIAGNOSIS — D509 Iron deficiency anemia, unspecified: Secondary | ICD-10-CM

## 2018-03-09 DIAGNOSIS — R011 Cardiac murmur, unspecified: Secondary | ICD-10-CM

## 2018-03-09 DIAGNOSIS — F32A Depression, unspecified: Secondary | ICD-10-CM

## 2018-03-09 DIAGNOSIS — F419 Anxiety disorder, unspecified: Secondary | ICD-10-CM

## 2018-03-09 DIAGNOSIS — K2100 Gastro-esophageal reflux disease with esophagitis, without bleeding: Secondary | ICD-10-CM

## 2018-03-09 DIAGNOSIS — I10 Essential (primary) hypertension: Secondary | ICD-10-CM | POA: Diagnosis not present

## 2018-03-09 DIAGNOSIS — Z79899 Other long term (current) drug therapy: Secondary | ICD-10-CM

## 2018-03-09 DIAGNOSIS — F329 Major depressive disorder, single episode, unspecified: Secondary | ICD-10-CM

## 2018-03-09 DIAGNOSIS — I350 Nonrheumatic aortic (valve) stenosis: Secondary | ICD-10-CM

## 2018-03-09 LAB — RETICULOCYTES
Immature Retic Fract: 11.7 % (ref 2.3–15.9)
RBC.: 3.66 MIL/uL — ABNORMAL LOW (ref 3.87–5.11)
RETIC CT PCT: 0.7 % (ref 0.4–3.1)
Retic Count, Absolute: 24.2 10*3/uL (ref 19.0–186.0)

## 2018-03-09 LAB — CBC WITH DIFFERENTIAL/PLATELET
Abs Immature Granulocytes: 0.01 10*3/uL (ref 0.00–0.07)
BASOS PCT: 1 %
Basophils Absolute: 0 10*3/uL (ref 0.0–0.1)
EOS ABS: 0.1 10*3/uL (ref 0.0–0.5)
EOS PCT: 1 %
HCT: 26.1 % — ABNORMAL LOW (ref 36.0–46.0)
Hemoglobin: 7.3 g/dL — ABNORMAL LOW (ref 12.0–15.0)
Immature Granulocytes: 0 %
LYMPHS ABS: 0.9 10*3/uL (ref 0.7–4.0)
Lymphocytes Relative: 24 %
MCH: 19.9 pg — ABNORMAL LOW (ref 26.0–34.0)
MCHC: 28 g/dL — AB (ref 30.0–36.0)
MCV: 71.3 fL — AB (ref 80.0–100.0)
MONOS PCT: 13 %
Monocytes Absolute: 0.5 10*3/uL (ref 0.1–1.0)
NRBC: 0 % (ref 0.0–0.2)
Neutro Abs: 2.1 10*3/uL (ref 1.7–7.7)
Neutrophils Relative %: 61 %
Platelets: 158 10*3/uL (ref 150–400)
RBC: 3.66 MIL/uL — ABNORMAL LOW (ref 3.87–5.11)
RDW: 18.2 % — AB (ref 11.5–15.5)
WBC: 3.6 10*3/uL — ABNORMAL LOW (ref 4.0–10.5)

## 2018-03-09 LAB — FERRITIN: FERRITIN: 6 ng/mL — AB (ref 11–307)

## 2018-03-09 LAB — LACTATE DEHYDROGENASE: LDH: 227 U/L — ABNORMAL HIGH (ref 98–192)

## 2018-03-09 LAB — IRON AND TIBC
Iron: 16 ug/dL — ABNORMAL LOW (ref 28–170)
SATURATION RATIOS: 4 % — AB (ref 10.4–31.8)
TIBC: 420 ug/dL (ref 250–450)
UIBC: 404 ug/dL

## 2018-03-09 LAB — SAVE SMEAR (SSMR)

## 2018-03-09 MED ORDER — SUCRALFATE 1 GM/10ML PO SUSP: ORAL | 1 refills | Status: DC

## 2018-03-09 MED ORDER — BUPROPION HCL ER (XL) 150 MG PO TB24: 150.0000 mg | ORAL_TABLET | Freq: Every day | ORAL | 0 refills | Status: DC

## 2018-03-09 MED ORDER — CHLORTHALIDONE 25 MG PO TABS: 25.0000 mg | ORAL_TABLET | Freq: Every day | ORAL | 0 refills | Status: DC

## 2018-03-09 MED ORDER — RANITIDINE HCL 300 MG PO TABS: 300.0000 mg | ORAL_TABLET | Freq: Every day | ORAL | 1 refills | Status: DC

## 2018-03-09 NOTE — Assessment & Plan Note (Signed)
Patient with severe anxiety and depression. She has been on multiple medications in the past but does not consistently take them. She is currently on Zoloft 50 mg QD targeted at both her anxiety and depression. We will add Wellbutrin XR 150 mg QD to target her depression. She will follow-up in 3 weeks.   Plan: - Continue Zoloft 50 mg QD  - START Wellbutrin 150 mg QD

## 2018-03-09 NOTE — Assessment & Plan Note (Signed)
Patient presented for continued evaluation and management of her chronic HTN. She is currently on amlodipine 10 mg QD. Does have some LE edema that improves with elevation. She is not experiencing constipation. Initial BP 196/97. Manual repeat 158/90. BP still above goal. BMP checked earlier this month with normal renal function. Plan to start chlorthalidone 25 mg QD.   Plan: - Continue amlodipine 10 mg QD  - START chlorthalidone 25 mg QAM

## 2018-03-09 NOTE — Patient Instructions (Signed)
Thank you for allowing us to provide your care. Today we are doing the following:  1. Checking your blood count. I will call you with the results in 1-2 days.   2. Adding Wellbutrin to your medication regimen. It is important that you take the Zoloft and Wellbutrin daily for your anxiety/depression. These medications must be taken consistently to work.   3. Adding chlorthalidone for your blood pressure. Please continue the amlodipine in addition to this medication.

## 2018-03-09 NOTE — Addendum Note (Signed)
Addended by: Bufford SpikesFULCHER,  N on: 03/09/2018 02:41 PM   Modules accepted: Orders

## 2018-03-09 NOTE — Assessment & Plan Note (Signed)
Continues to follow with GI and planning for surgery s/p TAVR. Continuing Dexlansoprazole, carafate, and ranitidine. Refills sent as she has not been back to see GI and is out. She will continue to follow with GI.

## 2018-03-09 NOTE — Progress Notes (Signed)
Case discussed with Dr. Helberg at the time of the visit. We reviewed the resident's history and exam and pertinent patient test results. I agree with the assessment, diagnosis, and plan of care documented in the resident's note. 

## 2018-03-09 NOTE — Assessment & Plan Note (Signed)
Patient follows with Dr. Clifton JamesMcAlhany and is planning to have a TAVR on 11/25. CBC done earlier this month with a microcytic anemia. Plan is to check iron studies today then give IV iron prior to the surgery.   Plan: - Iron studies followed by IV iron

## 2018-03-09 NOTE — Addendum Note (Signed)
Addended by: ,  N on: 03/09/2018 02:41 PM   Modules accepted: Orders  

## 2018-03-09 NOTE — Assessment & Plan Note (Signed)
CBC checked earlier this month with microcytic anemia. Consistent with prior labs.   Plan: - Iron studies followed by IV iron

## 2018-03-09 NOTE — Progress Notes (Signed)
   CC: Iron Deficiency Anemia  HPI:  Ms.Carnelia Judie PetitM Raul Dellston is a 84 y.o. female who presented to the clinic for continued evaluation and management of her chronic medical illnesses. For a detailed assessment and plan please refer to problem based charting below.   Past Medical History:  Diagnosis Date  . Anemia 12/14/2012  . Ataxia 06/24/2016  . Auditory hallucinations 08/19/2010   Has has auditory hallucinations, which seem to have worsened since death of her sister Sep 2011. [facility]  for [facility] weeks after mother died, also with hallucinations.  B/C of severe mental illness and refusal to see pysch, I have refused to refill controlled meds. If she decides to pursue mental health, then she needs to take the initiative and sch the appt bc [name] has s  . Barrett's esophagus    Demonstrated on EGD 12/2010. EGD 09/17/2012 shows inflamed GE junctional mucosa without metaplasia, dysplasia, or malignancy.  . Cataract   . Chronic anxiety    [facility]  for 6 weeks after mother died. Complicated again by death of her sister 2010. 2012 developed hallucinations and I refused to refill controlled meds unless she see psych which she is not agreable to  . Chronic insomnia   . Depression   . DVT (deep venous thrombosis) (HCC)   . Gastroparesis    Demonstrated on GES 12/2010 by Dr Dalene SeltzerKapland  . GERD (gastroesophageal reflux disease)   . H/O hiatal hernia   . HLD (hyperlipidemia) 01/2010  . HTN (hypertension)    Controlled with 2 drug therapy  . Left leg DVT (HCC) 09/28/2012  . Migraine   . Muscle spasm 09/29/2017  . Severe aortic stenosis    Review of Systems:  12 point ROS preformed. All negative aside from those mentioned in the HPI.  Physical Exam: Vitals:   03/09/18 1400  BP: (!) 196/97  Pulse: 77  Temp: 97.8 F (36.6 C)  TempSrc: Oral  SpO2: 100%  Weight: 126 lb 4.8 oz (57.3 kg)  Height: 5\' 3"  (1.6 m)   General: Thin elderly female in no acute  distress Pulm: Good air movement with no wheezing or crackles  CV: RRR, crescendo-decrescendo systolic murmur  Abdomen: Soft, non-distended, no tenderness to palpation  Extremities: Mild LE edema   Assessment & Plan:   See Encounters Tab for problem based charting.  Patient discussed with Dr. Josem KaufmannKlima

## 2018-03-10 NOTE — Progress Notes (Signed)
Sherri Mcmillan - I think inder the circumstances we should go ahead and arrange for her to get 2 units of blood to move the process along (in addition to the iron). She could get the units on the day she gets the iron. I can help with the orders if you like. DrG

## 2018-03-12 ENCOUNTER — Other Ambulatory Visit: Payer: Self-pay | Admitting: Internal Medicine

## 2018-03-12 DIAGNOSIS — D500 Iron deficiency anemia secondary to blood loss (chronic): Secondary | ICD-10-CM

## 2018-03-12 NOTE — Progress Notes (Signed)
Hey Dr. Clifton JamesMcAlhany, we are arranging for the patient to get 2 units pRBCs and 2 doses of IV iron. I will call short stay to get definitive dates but just wanted to give you a heads-up.   Thank you,  Jill AlexandersJustin

## 2018-03-15 ENCOUNTER — Telehealth: Payer: Self-pay | Admitting: Internal Medicine

## 2018-03-15 ENCOUNTER — Other Ambulatory Visit: Payer: Self-pay | Admitting: Internal Medicine

## 2018-03-15 NOTE — Telephone Encounter (Signed)
Called patient about blood transfusion. Set up with short stay on 03/20/2018 at 8am.

## 2018-03-17 ENCOUNTER — Ambulatory Visit: Payer: Self-pay | Admitting: Cardiology

## 2018-03-20 ENCOUNTER — Other Ambulatory Visit: Payer: Self-pay | Admitting: Oncology

## 2018-03-20 ENCOUNTER — Ambulatory Visit (HOSPITAL_COMMUNITY)
Admission: RE | Admit: 2018-03-20 | Discharge: 2018-03-20 | Disposition: A | Discharge status: Home / Self Care | Payer: Medicare Other | Source: Ambulatory Visit | Attending: Oncology | Admitting: Oncology

## 2018-03-20 ENCOUNTER — Ambulatory Visit (HOSPITAL_COMMUNITY): Admit: 2018-03-20 | Payer: Medicare Other | Admitting: Cardiovascular Disease

## 2018-03-20 ENCOUNTER — Encounter (HOSPITAL_COMMUNITY): Payer: Self-pay

## 2018-03-20 DIAGNOSIS — D509 Iron deficiency anemia, unspecified: Secondary | ICD-10-CM | POA: Diagnosis present

## 2018-03-20 DIAGNOSIS — D500 Iron deficiency anemia secondary to blood loss (chronic): Secondary | ICD-10-CM | POA: Insufficient documentation

## 2018-03-20 LAB — HEMOGLOBIN AND HEMATOCRIT, BLOOD
HEMATOCRIT: 28.8 % — AB (ref 36.0–46.0)
HEMOGLOBIN: 7.9 g/dL — AB (ref 12.0–15.0)

## 2018-03-20 LAB — PREPARE RBC (CROSSMATCH)

## 2018-03-20 SURGERY — RIGHT/LEFT HEART CATH AND CORONARY ANGIOGRAPHY
Anesthesia: LOCAL

## 2018-03-20 MED ORDER — ACETAMINOPHEN 325 MG PO TABS
650.0000 mg | ORAL_TABLET | Freq: Once | ORAL | Status: AC
  Administered 2018-03-20: 650 mg via ORAL

## 2018-03-20 MED ORDER — OXYCODONE HCL 5 MG PO TABS: 5.0000 mg | ORAL_TABLET | Freq: Four times a day (QID) | ORAL | 0 refills | Status: DC | PRN

## 2018-03-20 MED ORDER — SODIUM CHLORIDE 0.9 % IV SOLN
510.0000 mg | INTRAVENOUS | Status: DC
  Administered 2018-03-20: 510 mg via INTRAVENOUS
  Filled 2018-03-20: qty 510

## 2018-03-20 MED ORDER — SODIUM CHLORIDE 0.9% IV SOLUTION: Freq: Once | INTRAVENOUS | Status: DC

## 2018-03-20 MED ORDER — OXYCODONE HCL 5 MG PO TABS
5.0000 mg | ORAL_TABLET | Freq: Once | ORAL | Status: AC
  Administered 2018-03-20: 5 mg via ORAL
  Filled 2018-03-20: qty 1

## 2018-03-20 MED ORDER — ACETAMINOPHEN 325 MG PO TABS
ORAL_TABLET | ORAL | Status: AC
  Administered 2018-03-20: 650 mg via ORAL
  Filled 2018-03-20: qty 2

## 2018-03-20 NOTE — Progress Notes (Signed)
Called to room by patient.  She stated she was having chest pain 9/10 that was sharp and burning, shortness of breath and pain in her right arm.  She stated she has had these pains before and had them this morning but did not say anything, and they were getting worse and were different then the ones she had before. VSS. Called and reported the above to Dr Cyndie ChimeGranfortuna, who ordered an EKG and is coming up to assess the patient.  Will continue to monitor closely.

## 2018-03-20 NOTE — Progress Notes (Signed)
Medicine attending: 84 year old woman with known reflux esophagitis currently under evaluation for an aortic valve replacement for advanced aortic stenosis.  She has been anemic.  She was found to have Barrett's esophagus on most recent upper endoscopy in September 2018.  She was started on Carafate.  She is also on Dexilant.  She has not had a recorded colonoscopy since 2005.  She is followed by GI most recently Dr. Myrtie Neitheranis in July of this year.  She has chronic symptoms of heartburn only partially relieved by current medications.  She has a recurrent hiatus hernia felt to be contributing to her reflux symptoms.  She had a previous Nissen fundoplication which has deteriorated.  In view of her advanced valvular heart disease, repeat gastric surgery has been deferred pending valve replacement. I am working with her general internist, Dr. Caron PresumeHelberg, and her cardiologist, to get her stable for upcoming valve surgery.  She has a persistent significant anemia.  Presumably losing blood chronically from her GI tract.  She was scheduled to come in today for an elective blood transfusion and a dose of IV iron. Nurses called that she was complaining of "chest pain" and dyspnea. She tells me that she started having epigastric pain this morning unrelieved by a Dexilant tablet.  She took a another tablet.  Symptoms did not regress.  Pain is epigastric and radiates superiorly.  Associated with dyspnea.  Of note no known coronary artery disease. Exam: Elderly woman pleasant and cooperative moderate distress due to pain Blood pressure 119/67, pulse 67, temperature 98.6 F (37 C), temperature source Oral, resp. rate 20, , SpO2 100 %. Lungs overall clear to auscultation and resonant to percussion.  Regular cardiac rhythm.  3/6 aortic systolic murmur heard loudest at the left sternal border.  Radiating to the neck.  No JVD.  No peripheral edema.  No calf tenderness. Exquisite tenderness on palpation in the epigastric region.   No palpable mass or organomegaly.  Bowel sounds normal to decreased.  EKG which I personally reviewed shows normal sinus rhythm with no acute ischemic change or arrhythmia.  Lab hemoglobin hematocrit: 7.9/28.8 which is her baseline.  Impression: Flareup of chronic severe poorly controlled reflux esophagitis related to a hiatus hernia. I do not feel that her symptoms are cardiac in nature.  Plan: Proceed with blood transfusion and iron infusion as planned. I have informed her internist of her current symptoms. I am not sure what else we can offer her to try to get her symptoms controlled before she proceeds with her valve surgery.  For now, continue Carafate and Dexilant.

## 2018-03-21 LAB — BPAM RBC
BLOOD PRODUCT EXPIRATION DATE: 201912192359
Blood Product Expiration Date: 201912022359
ISSUE DATE / TIME: 201911251055
ISSUE DATE / TIME: 201911251357
UNIT TYPE AND RH: 5100
Unit Type and Rh: 5100

## 2018-03-21 LAB — TYPE AND SCREEN
ABO/RH(D): O POS
Antibody Screen: NEGATIVE
UNIT DIVISION: 0
Unit division: 0

## 2018-03-27 ENCOUNTER — Ambulatory Visit (HOSPITAL_COMMUNITY)
Admission: RE | Admit: 2018-03-27 | Discharge: 2018-03-27 | Disposition: A | Discharge status: Home / Self Care | Payer: Medicare Other | Source: Ambulatory Visit | Attending: Oncology | Admitting: Oncology

## 2018-03-27 DIAGNOSIS — D509 Iron deficiency anemia, unspecified: Secondary | ICD-10-CM | POA: Diagnosis present

## 2018-03-27 MED ORDER — SODIUM CHLORIDE 0.9 % IV SOLN
510.0000 mg | INTRAVENOUS | Status: AC
  Administered 2018-03-27: 510 mg via INTRAVENOUS
  Filled 2018-03-27: qty 510

## 2018-03-30 ENCOUNTER — Ambulatory Visit (INDEPENDENT_AMBULATORY_CARE_PROVIDER_SITE_OTHER): Payer: Medicare Other | Admitting: Internal Medicine

## 2018-03-30 ENCOUNTER — Encounter: Payer: Self-pay | Admitting: Internal Medicine

## 2018-03-30 ENCOUNTER — Other Ambulatory Visit: Payer: Self-pay

## 2018-03-30 VITALS — BP 146/75 | HR 57 | Temp 98.0°F | Ht 60.0 in | Wt 121.0 lb

## 2018-03-30 DIAGNOSIS — K449 Diaphragmatic hernia without obstruction or gangrene: Secondary | ICD-10-CM | POA: Diagnosis not present

## 2018-03-30 DIAGNOSIS — R011 Cardiac murmur, unspecified: Secondary | ICD-10-CM

## 2018-03-30 DIAGNOSIS — D500 Iron deficiency anemia secondary to blood loss (chronic): Secondary | ICD-10-CM | POA: Diagnosis not present

## 2018-03-30 DIAGNOSIS — K2970 Gastritis, unspecified, without bleeding: Secondary | ICD-10-CM | POA: Diagnosis not present

## 2018-03-30 DIAGNOSIS — Z9889 Other specified postprocedural states: Secondary | ICD-10-CM

## 2018-03-30 DIAGNOSIS — I350 Nonrheumatic aortic (valve) stenosis: Secondary | ICD-10-CM | POA: Diagnosis not present

## 2018-03-30 DIAGNOSIS — K2100 Gastro-esophageal reflux disease with esophagitis, without bleeding: Secondary | ICD-10-CM

## 2018-03-30 DIAGNOSIS — K219 Gastro-esophageal reflux disease without esophagitis: Secondary | ICD-10-CM

## 2018-03-30 NOTE — Assessment & Plan Note (Addendum)
Patient preparing for aortic valve replacement with Dr. Verne Carrowhristopher Mcalhany. Trying to optimize the patient medically prior to surgical intervention.

## 2018-03-30 NOTE — Patient Instructions (Signed)
Thank you for allowing us to provide your care. I will call you with the results of your blood work tomorrow. As long as everything is looking well I will let your cardiologist and your dentist know so we can proceed with surgery. Please do not hesitate to call if you have any questions or concerns. I will see you back in three months.

## 2018-03-30 NOTE — Assessment & Plan Note (Signed)
Patient with uncontrolled Sherri Mcmillan secondary to a hiatal hernia. His following up with Sherri Mcmillan and planning for surgery within the coming months. This will be performed after the patient has undergone an aortic valve replacement. We are optimizing the patient medically prior to surgical intervention.

## 2018-03-30 NOTE — Assessment & Plan Note (Signed)
Patient with iron deficiency anemia secondary to errosive gastritis and hiatal hernia. Planning for aortic valve replacement and hiatal hernia repair. We are attempting to maximize hermetically prior to the surgical interventions. She has received two doses of IV iron in two units of packed red cells. We will check on H&H today. If her hemoglobin is approximately 9 to 10 I will message doctors Verne CarrowChristopher McAlhany, Cindra Evesonald Kulinski, and Amada JupiterHenry Danis III so that the patient may proceed with upcoming surgeries.

## 2018-03-30 NOTE — Progress Notes (Signed)
   CC: F/U on Anemia  HPI:  Sherri Mcmillan is a 84 y.o. female who presented to the clinic for continued evaluation and management of her chronic medical illnesses. For a detailed assessment and plan please refer to problem based charting below.   Past Medical History:  Diagnosis Date  . Anemia 12/14/2012  . Ataxia 06/24/2016  . Auditory hallucinations 08/19/2010   Has has auditory hallucinations, which seem to have worsened since death of her sister Sep 2011. [facility]  for [facility] weeks after mother died, also with hallucinations.  B/C of severe mental illness and refusal to see pysch, I have refused to refill controlled meds. If she decides to pursue mental health, then she needs to take the initiative and sch the appt bc [name] has s  . Barrett's esophagus    Demonstrated on EGD 12/2010. EGD 09/17/2012 shows inflamed GE junctional mucosa without metaplasia, dysplasia, or malignancy.  . Cataract   . Chronic anxiety    [facility]  for [facility] weeks after mother died. Complicated again by death of her sister 2010. 2012 developed hallucinations and I refused to refill controlled meds unless she see psych which she is not agreable to  . Chronic insomnia   . Depression   . DVT (deep venous thrombosis) (HCC)   . Gastroparesis    Demonstrated on GES 12/2010 by Dr Dalene SeltzerKapland  . GERD (gastroesophageal reflux disease)   . H/O hiatal hernia   . HLD (hyperlipidemia) 01/2010  . HTN (hypertension)    Controlled with 2 drug therapy  . Left leg DVT (HCC) 09/28/2012  . Migraine   . Muscle spasm 09/29/2017  . Severe aortic stenosis    Review of Systems:  12 point ROS preformed. All negative aside from those mentioned in the HPI.  Physical Exam: Vitals:   03/30/18 1407  BP: (!) 146/75  Pulse: (!) 57  Temp: 98 F (36.7 C)  TempSrc: Oral  SpO2: 100%  Weight: 121 lb (54.9 kg)  Height: 5' (1.524 m)   General: Elderly female in no acute distress Pulm: Good  air movement with no wheezing or crackles  CV: RRR, crescendo-decrescendo systolic murmur Abdomen: Active bowel sounds, soft, non-distended, no tenderness to palpation   Assessment & Plan:   See Encounters Tab for problem based charting.  Patient discussed with Dr. Heide SparkNarendra

## 2018-03-31 ENCOUNTER — Other Ambulatory Visit (HOSPITAL_COMMUNITY): Payer: Self-pay | Admitting: Dentistry

## 2018-03-31 LAB — HEMOGLOBIN AND HEMATOCRIT, BLOOD
HEMATOCRIT: 33.4 % — AB (ref 34.0–46.6)
HEMOGLOBIN: 10.5 g/dL — AB (ref 11.1–15.9)

## 2018-03-31 NOTE — Progress Notes (Signed)
Internal Medicine Clinic Attending  Case discussed with Dr. Helberg at the time of the visit.  We reviewed the resident's history and exam and pertinent patient test results.  I agree with the assessment, diagnosis, and plan of care documented in the resident's note.    

## 2018-04-03 NOTE — Pre-Procedure Instructions (Signed)
Manuel Brett [name] Nolasco  04/03/2018      [id number]- [place], [place] - [address] [place] [id number] Phone: [phone] Fax: [phone]    Your procedure is scheduled on December 11th.  [facility] at [employer] A.M.  Call this number if you have problems the morning of surgery:  [phone]   Remember:  Do not eat or drink after midnight.    Take these medicines the morning of surgery with A SIP OF WATER  amLODipine (NORVASC)  buPROPion (WELLBUTRIN XL)  dexlansoprazole (DEXILANT) hydroxypropyl methylcellulose / hypromellose (ISOPTO TEARS / GONIOVISC) if needed PROAIR HFA 108 (90 Base) MCG/ACT inhaler If needed  7 days prior to surgery STOP taking any Aspirin(unless otherwise instructed by your surgeon), Aleve, Naproxen, Ibuprofen, Motrin, Advil, Goody's, BC's, all herbal medications, fish oil, and all vitamins.   Do not wear jewelry, make-up or nail polish.  Do not wear lotions, powders, or perfumes, or deodorant.  Do not shave 48 hours prior to surgery.  Men may shave face and neck.  Do not bring valuables to the hospital.  [facility] is not responsible for any belongings or valuables.  Contacts, dentures or bridgework may not be worn into surgery.  Leave your suitcase in the car.  After surgery it may be brought to your room.  For patients admitted to the hospital, discharge time will be determined by your treatment team.  Patients discharged the day of surgery will not be allowed to drive home.    [facility]- Preparing For Surgery  Before surgery, you can play an important role. Because skin is not sterile, your skin needs to be as free of germs as possible. You can reduce the number of germs on your skin by washing with CHG (chlorahexidine gluconate) Soap before surgery.  CHG is an antiseptic cleaner which kills germs and bonds with the skin to continue killing germs even after washing.    Oral  Hygiene is also important to reduce your risk of infection.  Remember - BRUSH YOUR TEETH THE MORNING OF SURGERY WITH YOUR REGULAR TOOTHPASTE  Please do not use if you have an allergy to CHG or antibacterial soaps. If your skin becomes reddened/irritated stop using the CHG.  Do not shave (including legs and underarms) for at least 48 hours prior to first CHG shower. It is OK to shave your face.  Please follow these instructions carefully.   1. Shower the NIGHT BEFORE SURGERY and the MORNING OF SURGERY with CHG.   2. If you chose to wash your hair, wash your hair first as usual with your normal shampoo.  3. After you shampoo, rinse your hair and body thoroughly to remove the shampoo.  4. Use CHG as you would any other liquid soap. You can apply CHG directly to the skin and wash gently with a scrungie or a clean washcloth.   5. Apply the CHG Soap to your body ONLY FROM THE NECK DOWN.  Do not use on open wounds or open sores. Avoid contact with your eyes, ears, mouth and genitals (private parts). Wash Face and genitals (private parts)  with your normal soap.  6. Wash thoroughly, paying special attention to the area where your surgery will be performed.  7. Thoroughly rinse your body with warm water from the neck down.  8. DO NOT shower/wash with your normal soap after using and rinsing off the CHG Soap.  9. Pat yourself dry  with a CLEAN TOWEL.  10. Wear CLEAN PAJAMAS to bed the night before surgery, wear comfortable clothes the morning of surgery  11. Place CLEAN SHEETS on your bed the night of your first shower and DO NOT SLEEP WITH PETS.    Day of Surgery:  Do not apply any deodorants/lotions.  Please wear clean clothes to the hospital/surgery center.   Remember to brush your teeth WITH YOUR REGULAR TOOTHPASTE.    Please read over the following fact sheets that you were given.

## 2018-04-04 ENCOUNTER — Encounter (HOSPITAL_COMMUNITY): Payer: Self-pay

## 2018-04-04 ENCOUNTER — Encounter (HOSPITAL_COMMUNITY)
Admission: RE | Admit: 2018-04-04 | Discharge: 2018-04-04 | Disposition: A | Discharge status: Home / Self Care | Payer: Medicare Other | Source: Ambulatory Visit | Attending: Dentistry | Admitting: Dentistry

## 2018-04-04 DIAGNOSIS — E785 Hyperlipidemia, unspecified: Secondary | ICD-10-CM | POA: Diagnosis not present

## 2018-04-04 DIAGNOSIS — Z8249 Family history of ischemic heart disease and other diseases of the circulatory system: Secondary | ICD-10-CM | POA: Diagnosis not present

## 2018-04-04 DIAGNOSIS — Z01812 Encounter for preprocedural laboratory examination: Secondary | ICD-10-CM | POA: Insufficient documentation

## 2018-04-04 DIAGNOSIS — K449 Diaphragmatic hernia without obstruction or gangrene: Secondary | ICD-10-CM | POA: Diagnosis not present

## 2018-04-04 DIAGNOSIS — Z01810 Encounter for preprocedural cardiovascular examination: Secondary | ICD-10-CM | POA: Diagnosis not present

## 2018-04-04 DIAGNOSIS — Z86718 Personal history of other venous thrombosis and embolism: Secondary | ICD-10-CM | POA: Diagnosis not present

## 2018-04-04 DIAGNOSIS — F419 Anxiety disorder, unspecified: Secondary | ICD-10-CM | POA: Diagnosis not present

## 2018-04-04 DIAGNOSIS — K210 Gastro-esophageal reflux disease with esophagitis: Secondary | ICD-10-CM | POA: Diagnosis not present

## 2018-04-04 DIAGNOSIS — G43909 Migraine, unspecified, not intractable, without status migrainosus: Secondary | ICD-10-CM | POA: Diagnosis not present

## 2018-04-04 DIAGNOSIS — I10 Essential (primary) hypertension: Secondary | ICD-10-CM | POA: Insufficient documentation

## 2018-04-04 DIAGNOSIS — F329 Major depressive disorder, single episode, unspecified: Secondary | ICD-10-CM | POA: Diagnosis not present

## 2018-04-04 DIAGNOSIS — E876 Hypokalemia: Secondary | ICD-10-CM | POA: Diagnosis not present

## 2018-04-04 DIAGNOSIS — I350 Nonrheumatic aortic (valve) stenosis: Secondary | ICD-10-CM

## 2018-04-04 DIAGNOSIS — K0530 Chronic periodontitis, unspecified: Secondary | ICD-10-CM | POA: Diagnosis not present

## 2018-04-04 DIAGNOSIS — K2270 Barrett's esophagus without dysplasia: Secondary | ICD-10-CM | POA: Diagnosis not present

## 2018-04-04 DIAGNOSIS — Z87891 Personal history of nicotine dependence: Secondary | ICD-10-CM | POA: Diagnosis not present

## 2018-04-04 DIAGNOSIS — R0789 Other chest pain: Secondary | ICD-10-CM | POA: Diagnosis not present

## 2018-04-04 DIAGNOSIS — K083 Retained dental root: Secondary | ICD-10-CM | POA: Diagnosis not present

## 2018-04-04 DIAGNOSIS — Z8719 Personal history of other diseases of the digestive system: Secondary | ICD-10-CM | POA: Diagnosis not present

## 2018-04-04 DIAGNOSIS — K029 Dental caries, unspecified: Secondary | ICD-10-CM | POA: Diagnosis not present

## 2018-04-04 DIAGNOSIS — K3184 Gastroparesis: Secondary | ICD-10-CM | POA: Diagnosis not present

## 2018-04-04 DIAGNOSIS — D500 Iron deficiency anemia secondary to blood loss (chronic): Secondary | ICD-10-CM | POA: Diagnosis not present

## 2018-04-04 LAB — CBC
HEMATOCRIT: 34.4 % — AB (ref 36.0–46.0)
HEMOGLOBIN: 10.7 g/dL — AB (ref 12.0–15.0)
MCH: 24.4 pg — AB (ref 26.0–34.0)
MCHC: 31.1 g/dL (ref 30.0–36.0)
MCV: 78.5 fL — ABNORMAL LOW (ref 80.0–100.0)
Platelets: 137 10*3/uL — ABNORMAL LOW (ref 150–400)
RBC: 4.38 MIL/uL (ref 3.87–5.11)
RDW: 24.8 % — ABNORMAL HIGH (ref 11.5–15.5)
WBC: 3.6 10*3/uL — ABNORMAL LOW (ref 4.0–10.5)
nRBC: 0 % (ref 0.0–0.2)

## 2018-04-04 LAB — BASIC METABOLIC PANEL
Anion gap: 10 (ref 5–15)
BUN: 16 mg/dL (ref 8–23)
CHLORIDE: 110 mmol/L (ref 98–111)
CO2: 23 mmol/L (ref 22–32)
Calcium: 9.2 mg/dL (ref 8.9–10.3)
Creatinine, Ser: 1 mg/dL (ref 0.44–1.00)
GFR calc Af Amer: >60 mL/min (ref >60)
GFR calc non Af Amer: 52 mL/min — ABNORMAL LOW (ref >60)
Glucose, Bld: 103 mg/dL — ABNORMAL HIGH (ref 70–99)
POTASSIUM: 5.3 mmol/L — AB (ref 3.5–5.1)
SODIUM: 143 mmol/L (ref 135–145)

## 2018-04-04 NOTE — Progress Notes (Signed)
Anesthesia Chart Review:  Case:  [id number] Date/Time:  04/05/18 0815   Procedure:  DENTAL RESTORATION AND MULTIPLEEXTRACTIONS AND AVELOPLASTY (Bilateral )   Anesthesia type:  General   Pre-op diagnosis:  SEVER AORTIC STENOSIS   Location:  [facility] OR ROOM 18 / [facility] OR   Surgeon:  Charlynne PanderKulinski, Ronald F, DDS      DISCUSSION: Patient is a 84 year old female scheduled for the above procedure. TAVR in the future is anticipated. Last seen by cardiologist Verne CarrowMcAlhany, Christopher, MD on 03/01/18. Last seen by PCP Levora DredgeHelberg, Justin, MD on 03/30/18 (for anemia follow-up; H&P). H/H felt stable  History includes former smoker, severe, AS, HTN, gastroparesis, LLE DVT 2014, hiatal hernia, Nissen fundoplication 2015, anemia, migraines, auditory hallucinations 2012, HLD.   See Provider section for recent cardiology and GI evaluations. Based on currently available information, I am anticipating that she can proceed as planned. Anesthesiologist to evaluate on the day of surgery. ISTAT4 ordered to recheck K (5.3 at PAT, but hemolyzed).    VS: BP (!) 153/73   Pulse 60   Temp 36.7 C   Resp 18   Ht 5' (1.524 m)   Wt 53.5 kg   LMP 04/26/1948   SpO2 100%   BMI 23.05 kg/m    PROVIDERS: Levora DredgeHelberg, Justin, MD is PCP with [facility]. Last visit 03/30/18. IF HGB was stable at ~ 9-10 then he was going to let Dr. Clifton JamesMcAlhany, Dr. Kristin BruinsKulinski, and Dr. Myrtie Neitheranis that patient could move forward with plans for upcoming surgeries. (HGB 10.7). Verne Carrow- McAlhany, Christopher, MD is cardiologist. She was found to have AS during preoperative evaluation for hiatal hernia repair. He referred her for dental evaluation prior to TAVR. She did have a low risk stress test, but he discussed proceeding with cath prior to TAVR, but this has not been done yet--appears to have been delayed due to need for PRBC.  - Hoy Finlayanis, Henry III, MD is GI. Last visit 03/01/18. He is seeing patient for anemia. By notes, last EGD 2018 and colonoscopy 2012. There is  question if she has developed erosion with the hernia that could be contributing to IDA. Further work-up is being considered after TAVR recovery.     LABS: Preoperative labs noted. K 5.3, but specimen hemolyzed. Glucose 103. WBC 3.6, stable when compared to labs from last month. H/H 10.7/34.4, stable since 03/30/18 (s/p PRBC transfusion and IV iron 03/20/18).  PLT 137.  (all labs ordered are listed, but only abnormal results are displayed)  Labs Reviewed  CBC - Abnormal; Notable for the following components:      Result Value   WBC 3.6 (*)    Hemoglobin 10.7 (*)    HCT 34.4 (*)    MCV 78.5 (*)    MCH 24.4 (*)    RDW 24.8 (*)    Platelets 137 (*)    All other components within normal limits  BASIC METABOLIC PANEL - Abnormal; Notable for the following components:   Potassium 5.3 (*)    Glucose, Bld 103 (*)    GFR calc non Af Amer 52 (*)    All other components within normal limits    EKG: 03/20/18: NSR   CV: Echo 02/24/18: Study Conclusions - Left ventricle: The cavity size was normal. There was mild   concentric hypertrophy. Systolic function was normal. The   estimated ejection fraction was in the range of 60% to 65%. Wall   motion was normal; there were no regional wall motion   abnormalities.  Features are consistent with a pseudonormal left   ventricular filling pattern, with concomitant abnormal relaxation   and increased filling pressure (grade 2 diastolic dysfunction). - Aortic valve: Valve mobility was moderately restricted. There was   moderate to severe stenosis. There was trivial regurgitation.   Calculated aortic valve area and dimensionless obstructive index   overestimate AS severity due to inaccurate LVOT pulsed wave   sampling location, Even so, the aortic stenosis is approaching   the severe range. VTI ratio of LVOT to aortic valve: 0.26. Valve area (VTI): 0.9 cm^2. Indexed valve area (VTI): 0.59 cm^2/m^2. Peak velocity ratio of LVOT to aortic valve: 0.24.  Valve area (Vmax): 0.82 cm^2. Indexed valve area (Vmax): 0.53 cm^2/m^2. Mean velocity ratio of LVOT to aortic valve: 0.23. Valve area (Vmean): 0.8 cm^2. Indexed valve area (Vmean): 0.52 cm^2/m^2. Mean gradient (S): 35 mm Hg. Peak gradient (S): 65 mm Hg. - Pulmonary arteries: Systolic pressure was mildly increased. PA   peak pressure: 36 mm Hg (S). Impressions: - Aortic stenosis has worsened since 11/10/2015 and is approaching   severe range.  Nuclear stress test 02/24/18:  The left ventricular ejection fraction is normal (55-65%).  Nuclear stress EF: 64%.  There was no ST segment deviation noted during stress.  The study is normal.  This is a low risk study. Normal resting and stress perfusion. No ischemia or infarction EF 64%   Past Medical History:  Diagnosis Date  . Anemia 12/14/2012  . Ataxia 06/24/2016  . Auditory hallucinations 08/19/2010   Has has auditory hallucinations, which seem to have worsened since death of her sister 01-13-2010. Admission to [facility] (90s or early 2000)  for 6 weeks after mother died, also with hallucinations.  B/C of severe mental illness and refusal to see pysch, I have refused to refill controlled meds. If she decides to pursue mental health, then she needs to take the initiative and sch the appt bc [name] has s  . Barrett's esophagus    Demonstrated on EGD Jan 14, 2011. EGD 09/17/2012 shows inflamed GE junctional mucosa without metaplasia, dysplasia, or malignancy.  . Cataract   . Chronic anxiety    Admission to [facility] (90s or early 2000)  for 6 weeks after mother died. Complicated again by death of her sister 2010. 2012 developed hallucinations and I refused to refill controlled meds unless she see psych which she is not agreable to  . Chronic insomnia   . Depression   . DVT (deep venous thrombosis) (HCC)   . Gastroparesis    Demonstrated on GES 2011-01-14 by Dr Dalene Seltzer  . GERD (gastroesophageal reflux disease)   . H/O hiatal hernia   . HLD (hyperlipidemia)  01/2010  . HTN (hypertension)    Controlled with 2 drug therapy  . Left leg DVT (HCC) 09/28/2012  . Migraine   . Muscle spasm 09/29/2017  . Severe aortic stenosis     Past Surgical History:  Procedure Laterality Date  . 24 HOUR PH STUDY N/A 08/24/2017   Procedure: 24 HOUR PH STUDY IMPEDANCE;  Surgeon: Sherrilyn Rist, MD;  Location: [facility] ENDOSCOPY;  Service: Gastroenterology;  Laterality: N/A;  . ABDOMINAL HYSTERECTOMY    . APPENDECTOMY    . CATARACT EXTRACTION     left eye  . COLONOSCOPY  2000&2005   Dr.Magod  . DIRECT LARYNGOSCOPY   14-Jan-2007   preoperative diagnosis hoarseness with anterior right vocal cord lesion -  direct laryngoscopy and excisional biopsy of right anterior vocal cord lesion  done by Dr. Ezzard Standing  . ENDOVENOUS ABLATION SAPHENOUS VEIN W/ LASER Left 05-09-2014   EVLA left small saphenous vein by Gretta Began MD  . ESOPHAGEAL MANOMETRY N/A 08/24/2017   Procedure: ESOPHAGEAL MANOMETRY (EM);  Surgeon: Sherrilyn Rist, MD;  Location: [facility] ENDOSCOPY;  Service: Gastroenterology;  Laterality: N/A;  . ESOPHAGOGASTRODUODENOSCOPY  11/2010  . ESOPHAGOGASTRODUODENOSCOPY N/A 09/17/2012   Procedure: ESOPHAGOGASTRODUODENOSCOPY (EGD);  Surgeon: Hart Carwin, MD;  Location: [facility] ENDOSCOPY;  Service: Endoscopy;  Laterality: N/A;  . ESOPHAGOGASTRODUODENOSCOPY N/A 12/17/2012   Procedure: ESOPHAGOGASTRODUODENOSCOPY (EGD);  Surgeon: Beverley Fiedler, MD;  Location: [facility] ENDOSCOPY;  Service: Gastroenterology;  Laterality: N/A;  . EXCISION MASS HEAD N/A 03/05/2016   Procedure: EXCISION POSTERIOR SCALP MASS;  Surgeon: Axel Filler, MD;  Location: [facility] OR;  Service: General;  Laterality: N/A;  . EYE SURGERY    . HEMORRHOID SURGERY    . KNEE ARTHROSCOPY    . LAPAROSCOPIC NISSEN FUNDOPLICATION N/A 05/02/2013   Procedure: LAPAROSCOPIC NISSEN FUNDOPLICATION;  Surgeon: Valarie Merino, MD;  Location: [facility] ORS;  Service: General;  Laterality: N/A;  . MENISCECTOMY   July 2002    preoperative diagnosis torn  medial meniscus right knee, partial medial meniscectomy, debridement chondroplasty patellofemoral joint, done by Dr. Madelon Lips  . POLYPECTOMY  2000   Dr.Magod  . ROTATOR CUFF REPAIR  09/21/2011   rt shoulder    MEDICATIONS: . amLODipine (NORVASC) 10 MG tablet  . buPROPion (WELLBUTRIN XL) 150 MG 24 hr tablet  . chlorthalidone (HYGROTON) 25 MG tablet  . [facility] MELATONIN 10 MG CAPS  . dexlansoprazole (DEXILANT) 60 MG capsule  . hydroxypropyl methylcellulose / hypromellose (ISOPTO TEARS / GONIOVISC) 2.5 % ophthalmic solution  . oxyCODONE (ROXICODONE) 5 MG immediate release tablet  . PROAIR HFA 108 (90 Base) MCG/ACT inhaler  . ranitidine (ZANTAC) 300 MG tablet  . sertraline (ZOLOFT) 50 MG tablet  . sucralfate (CARAFATE) 1 GM/10ML suspension   No current facility-administered medications for this encounter.     [facility] Short Stay Center/Anesthesiology Phone [phone] 04/04/2018 4:27 PM

## 2018-04-04 NOTE — Anesthesia Preprocedure Evaluation (Addendum)
Anesthesia Evaluation  Patient identified by MRN, date of birth, ID band Patient awake    Reviewed: Allergy & Precautions, NPO status , Patient's Chart, lab work & pertinent test results  History of Anesthesia Complications Negative for: history of anesthetic complications  Airway Mallampati: III  TM Distance: >3 FB Neck ROM: Full  Mouth opening: Limited Mouth Opening Comment: Prominent lower teeth Dental  (+) Dental Advisory Given, Poor Dentition   Pulmonary former smoker,    Pulmonary exam normal breath sounds clear to auscultation       Cardiovascular hypertension, Pt. on medications Normal cardiovascular exam+ Valvular Problems/Murmurs AS  Rhythm:Regular Rate:Normal + Systolic murmurs Nuclear stress 11/19: negative for ischemia, EF 55-60%  TTE 11/19: mild LVH, EF 60-65%, grade 2 diastolic dysfunction, moderate-severe AS (valve area 0.8)    Neuro/Psych  Headaches, Anxiety Depression    GI/Hepatic Neg liver ROS, hiatal hernia (s/p Nissen), GERD  Medicated and Controlled,  Endo/Other  negative endocrine ROS  Renal/GU negative Renal ROS     Musculoskeletal negative musculoskeletal ROS (+)   Abdominal   Peds  Hematology  (+) anemia , Hgb 10.7, plts 137   Anesthesia Other Findings Day of surgery medications reviewed with the patient.  Reproductive/Obstetrics                          Anesthesia Physical Anesthesia Plan  ASA: III  Anesthesia Plan: General   Post-op Pain Management:    Induction: Intravenous  PONV Risk Score and Plan: 3 and Treatment may vary due to age or medical condition and Ondansetron  Airway Management Planned: Oral ETT  Additional Equipment: Arterial line  Intra-op Plan:   Post-operative Plan: Extubation in OR  Informed Consent: I have reviewed the patients History and Physical, chart, labs and discussed the procedure including the risks, benefits and  alternatives for the proposed anesthesia with the patient or authorized representative who has indicated his/her understanding and acceptance.   Dental advisory given  Plan Discussed with: CRNA  Anesthesia Plan Comments: (PAT note written 04/04/2018 by Shonna ChockAllison Zelenak, PA-C. Needs pre-TAVR dental surgery.   Pre-induction arterial line. Please have esmolol available for induction. )     Anesthesia Quick Evaluation

## 2018-04-05 ENCOUNTER — Observation Stay (HOSPITAL_COMMUNITY)
Admission: RE | Admit: 2018-04-05 | Discharge: 2018-04-07 | Disposition: A | Discharge status: Home / Self Care | Payer: Medicare Other | Attending: Cardiovascular Disease | Admitting: Cardiovascular Disease

## 2018-04-05 ENCOUNTER — Encounter (HOSPITAL_COMMUNITY): Payer: Self-pay

## 2018-04-05 ENCOUNTER — Ambulatory Visit (HOSPITAL_COMMUNITY): Payer: Medicare Other | Admitting: Certified Registered Nurse Anesthetist

## 2018-04-05 ENCOUNTER — Other Ambulatory Visit: Payer: Self-pay

## 2018-04-05 ENCOUNTER — Encounter (HOSPITAL_COMMUNITY)
Admission: RE | Disposition: A | Discharge status: Home / Self Care | Payer: Self-pay | Source: Home / Self Care | Attending: Cardiovascular Disease

## 2018-04-05 DIAGNOSIS — R0789 Other chest pain: Secondary | ICD-10-CM | POA: Insufficient documentation

## 2018-04-05 DIAGNOSIS — I350 Nonrheumatic aortic (valve) stenosis: Secondary | ICD-10-CM | POA: Diagnosis not present

## 2018-04-05 DIAGNOSIS — K0889 Other specified disorders of teeth and supporting structures: Secondary | ICD-10-CM

## 2018-04-05 DIAGNOSIS — Z01810 Encounter for preprocedural cardiovascular examination: Principal | ICD-10-CM | POA: Insufficient documentation

## 2018-04-05 DIAGNOSIS — F419 Anxiety disorder, unspecified: Secondary | ICD-10-CM | POA: Insufficient documentation

## 2018-04-05 DIAGNOSIS — K029 Dental caries, unspecified: Secondary | ICD-10-CM

## 2018-04-05 DIAGNOSIS — K083 Retained dental root: Secondary | ICD-10-CM | POA: Diagnosis not present

## 2018-04-05 DIAGNOSIS — K210 Gastro-esophageal reflux disease with esophagitis: Secondary | ICD-10-CM | POA: Insufficient documentation

## 2018-04-05 DIAGNOSIS — Z86718 Personal history of other venous thrombosis and embolism: Secondary | ICD-10-CM | POA: Insufficient documentation

## 2018-04-05 DIAGNOSIS — E785 Hyperlipidemia, unspecified: Secondary | ICD-10-CM | POA: Insufficient documentation

## 2018-04-05 DIAGNOSIS — F329 Major depressive disorder, single episode, unspecified: Secondary | ICD-10-CM | POA: Insufficient documentation

## 2018-04-05 DIAGNOSIS — K036 Deposits [accretions] on teeth: Secondary | ICD-10-CM | POA: Diagnosis not present

## 2018-04-05 DIAGNOSIS — K0530 Chronic periodontitis, unspecified: Secondary | ICD-10-CM | POA: Diagnosis not present

## 2018-04-05 DIAGNOSIS — K2270 Barrett's esophagus without dysplasia: Secondary | ICD-10-CM | POA: Insufficient documentation

## 2018-04-05 DIAGNOSIS — D500 Iron deficiency anemia secondary to blood loss (chronic): Secondary | ICD-10-CM | POA: Insufficient documentation

## 2018-04-05 DIAGNOSIS — G43909 Migraine, unspecified, not intractable, without status migrainosus: Secondary | ICD-10-CM | POA: Insufficient documentation

## 2018-04-05 DIAGNOSIS — K449 Diaphragmatic hernia without obstruction or gangrene: Secondary | ICD-10-CM | POA: Insufficient documentation

## 2018-04-05 DIAGNOSIS — Z87891 Personal history of nicotine dependence: Secondary | ICD-10-CM | POA: Insufficient documentation

## 2018-04-05 DIAGNOSIS — E876 Hypokalemia: Secondary | ICD-10-CM | POA: Diagnosis present

## 2018-04-05 DIAGNOSIS — K3184 Gastroparesis: Secondary | ICD-10-CM | POA: Insufficient documentation

## 2018-04-05 DIAGNOSIS — I10 Essential (primary) hypertension: Secondary | ICD-10-CM | POA: Insufficient documentation

## 2018-04-05 DIAGNOSIS — Z8719 Personal history of other diseases of the digestive system: Secondary | ICD-10-CM | POA: Insufficient documentation

## 2018-04-05 DIAGNOSIS — Z8249 Family history of ischemic heart disease and other diseases of the circulatory system: Secondary | ICD-10-CM | POA: Insufficient documentation

## 2018-04-05 HISTORY — PX: TOOTH EXTRACTION: SHX859

## 2018-04-05 LAB — POCT I-STAT 4, (NA,K, GLUC, HGB,HCT)
Glucose, Bld: 87 mg/dL (ref 70–99)
HCT: 34 % — ABNORMAL LOW (ref 36.0–46.0)
Hemoglobin: 11.6 g/dL — ABNORMAL LOW (ref 12.0–15.0)
Potassium: 3.5 mmol/L (ref 3.5–5.1)
Sodium: 142 mmol/L (ref 135–145)

## 2018-04-05 SURGERY — DENTAL RESTORATION/EXTRACTIONS
Anesthesia: General | Laterality: Bilateral

## 2018-04-05 MED ORDER — PHENYLEPHRINE 40 MCG/ML (10ML) SYRINGE FOR IV PUSH (FOR BLOOD PRESSURE SUPPORT)
PREFILLED_SYRINGE | INTRAVENOUS | Status: DC | PRN
  Administered 2018-04-05 (×3): 40 ug via INTRAVENOUS
  Administered 2018-04-05: 80 ug via INTRAVENOUS

## 2018-04-05 MED ORDER — POLYVINYL ALCOHOL 1.4 % OP SOLN: 1.0000 [drp] | Freq: Every day | OPHTHALMIC | Status: DC | PRN

## 2018-04-05 MED ORDER — ACETAMINOPHEN 160 MG/5ML PO SOLN
650.0000 mg | ORAL | Status: DC | PRN
  Filled 2018-04-05: qty 20.3

## 2018-04-05 MED ORDER — LIDOCAINE 2% (20 MG/ML) 5 ML SYRINGE
INTRAMUSCULAR | Status: AC
  Filled 2018-04-05: qty 5

## 2018-04-05 MED ORDER — ACETAMINOPHEN 650 MG RE SUPP: 650.0000 mg | RECTAL | Status: DC | PRN

## 2018-04-05 MED ORDER — 0.9 % SODIUM CHLORIDE (POUR BTL) OPTIME
TOPICAL | Status: DC | PRN
  Administered 2018-04-05: 1000 mL

## 2018-04-05 MED ORDER — FENTANYL CITRATE (PF) 100 MCG/2ML IJ SOLN
INTRAMUSCULAR | Status: AC
  Administered 2018-04-05: 50 ug via INTRAVENOUS
  Filled 2018-04-05: qty 2

## 2018-04-05 MED ORDER — SODIUM CHLORIDE 0.9% FLUSH
3.0000 mL | Freq: Two times a day (BID) | INTRAVENOUS | Status: DC
  Administered 2018-04-06: 3 mL via INTRAVENOUS

## 2018-04-05 MED ORDER — HYPROMELLOSE (GONIOSCOPIC) 2.5 % OP SOLN
1.0000 [drp] | Freq: Every day | OPHTHALMIC | Status: DC | PRN
  Filled 2018-04-05: qty 15

## 2018-04-05 MED ORDER — LIDOCAINE-EPINEPHRINE 2 %-1:100000 IJ SOLN
INTRAMUSCULAR | Status: AC
  Filled 2018-04-05: qty 6.8

## 2018-04-05 MED ORDER — PROPOFOL 10 MG/ML IV BOLUS
INTRAVENOUS | Status: AC
  Filled 2018-04-05: qty 20

## 2018-04-05 MED ORDER — HEPARIN SODIUM (PORCINE) 5000 UNIT/ML IJ SOLN
5000.0000 [IU] | Freq: Three times a day (TID) | INTRAMUSCULAR | Status: DC
  Administered 2018-04-05 – 2018-04-06 (×3): 5000 [IU] via SUBCUTANEOUS
  Filled 2018-04-05 (×3): qty 1

## 2018-04-05 MED ORDER — SODIUM CHLORIDE 0.9 % IV SOLN
INTRAVENOUS | Status: DC | PRN
  Administered 2018-04-05: 10 ug/min via INTRAVENOUS

## 2018-04-05 MED ORDER — MELATONIN 10 MG PO CAPS: 10.0000 mg | ORAL_CAPSULE | Freq: Every evening | ORAL | Status: DC | PRN

## 2018-04-05 MED ORDER — PHENYLEPHRINE 40 MCG/ML (10ML) SYRINGE FOR IV PUSH (FOR BLOOD PRESSURE SUPPORT)
PREFILLED_SYRINGE | INTRAVENOUS | Status: AC
  Filled 2018-04-05: qty 10

## 2018-04-05 MED ORDER — ONDANSETRON HCL 4 MG/2ML IJ SOLN
INTRAMUSCULAR | Status: AC
  Filled 2018-04-05: qty 2

## 2018-04-05 MED ORDER — PROPOFOL 10 MG/ML IV BOLUS
INTRAVENOUS | Status: DC | PRN
  Administered 2018-04-05: 50 mg via INTRAVENOUS
  Administered 2018-04-05 (×2): 25 mg via INTRAVENOUS
  Administered 2018-04-05: 100 mg via INTRAVENOUS

## 2018-04-05 MED ORDER — OXYCODONE HCL 5 MG PO TABS
5.0000 mg | ORAL_TABLET | Freq: Once | ORAL | Status: AC | PRN
  Administered 2018-04-05: 5 mg via ORAL

## 2018-04-05 MED ORDER — ONDANSETRON HCL 4 MG/2ML IJ SOLN
INTRAMUSCULAR | Status: DC | PRN
  Administered 2018-04-05: 4 mg via INTRAVENOUS

## 2018-04-05 MED ORDER — LABETALOL HCL 5 MG/ML IV SOLN
INTRAVENOUS | Status: AC
  Filled 2018-04-05: qty 4

## 2018-04-05 MED ORDER — AMLODIPINE BESYLATE 10 MG PO TABS
10.0000 mg | ORAL_TABLET | Freq: Every day | ORAL | Status: DC
  Administered 2018-04-05 – 2018-04-07 (×3): 10 mg via ORAL
  Filled 2018-04-05 (×3): qty 1

## 2018-04-05 MED ORDER — POLYETHYLENE GLYCOL 3350 17 G PO PACK: 17.0000 g | PACK | Freq: Every day | ORAL | Status: DC | PRN

## 2018-04-05 MED ORDER — ACETAMINOPHEN 10 MG/ML IV SOLN
INTRAVENOUS | Status: AC
  Administered 2018-04-05: 1000 mg via INTRAVENOUS
  Filled 2018-04-05: qty 100

## 2018-04-05 MED ORDER — LACTATED RINGERS IV SOLN
INTRAVENOUS | Status: DC | PRN
  Administered 2018-04-05: 08:00:00 via INTRAVENOUS

## 2018-04-05 MED ORDER — OXYCODONE HCL 5 MG/5ML PO SOLN: 5.0000 mg | Freq: Once | ORAL | Status: AC | PRN

## 2018-04-05 MED ORDER — OXYMETAZOLINE HCL 0.05 % NA SOLN
NASAL | Status: AC
  Filled 2018-04-05: qty 15

## 2018-04-05 MED ORDER — FENTANYL CITRATE (PF) 100 MCG/2ML IJ SOLN
25.0000 ug | INTRAMUSCULAR | Status: DC | PRN
  Administered 2018-04-05: 25 ug via INTRAVENOUS

## 2018-04-05 MED ORDER — FENTANYL CITRATE (PF) 100 MCG/2ML IJ SOLN
INTRAMUSCULAR | Status: DC | PRN
  Administered 2018-04-05: 25 ug via INTRAVENOUS
  Administered 2018-04-05: 50 ug via INTRAVENOUS

## 2018-04-05 MED ORDER — ACETAMINOPHEN 10 MG/ML IV SOLN
1000.0000 mg | Freq: Once | INTRAVENOUS | Status: DC | PRN
  Administered 2018-04-05: 1000 mg via INTRAVENOUS

## 2018-04-05 MED ORDER — NITROGLYCERIN 0.4 MG SL SUBL: 0.4000 mg | SUBLINGUAL_TABLET | SUBLINGUAL | Status: DC | PRN

## 2018-04-05 MED ORDER — BUPIVACAINE-EPINEPHRINE (PF) 0.5% -1:200000 IJ SOLN
INTRAMUSCULAR | Status: AC
  Filled 2018-04-05: qty 10.8

## 2018-04-05 MED ORDER — HYDRALAZINE HCL 20 MG/ML IJ SOLN
10.0000 mg | INTRAMUSCULAR | Status: DC | PRN
  Administered 2018-04-05: 10 mg via INTRAVENOUS
  Filled 2018-04-05: qty 1

## 2018-04-05 MED ORDER — ACETAMINOPHEN 325 MG PO TABS: 650.0000 mg | ORAL_TABLET | ORAL | Status: DC | PRN

## 2018-04-05 MED ORDER — FENTANYL CITRATE (PF) 100 MCG/2ML IJ SOLN
25.0000 ug | INTRAMUSCULAR | Status: DC | PRN
  Administered 2018-04-05 (×3): 50 ug via INTRAVENOUS

## 2018-04-05 MED ORDER — ESMOLOL HCL 100 MG/10ML IV SOLN
INTRAVENOUS | Status: AC
  Filled 2018-04-05: qty 10

## 2018-04-05 MED ORDER — HEMOSTATIC AGENTS (NO CHARGE) OPTIME
TOPICAL | Status: DC | PRN
  Administered 2018-04-05: 1 via TOPICAL

## 2018-04-05 MED ORDER — BUPIVACAINE-EPINEPHRINE 0.5% -1:200000 IJ SOLN
INTRAMUSCULAR | Status: DC | PRN
  Administered 2018-04-05: 1.8 mL

## 2018-04-05 MED ORDER — MELATONIN 3 MG PO TABS
9.0000 mg | ORAL_TABLET | Freq: Every evening | ORAL | Status: DC | PRN
  Filled 2018-04-05: qty 3

## 2018-04-05 MED ORDER — LIDOCAINE HCL (PF) 2 % IJ SOLN
INTRAMUSCULAR | Status: DC | PRN
  Administered 2018-04-05: 7.2 mL via INTRADERMAL

## 2018-04-05 MED ORDER — SODIUM CHLORIDE 0.9 % WEIGHT BASED INFUSION
3.0000 mL/kg/h | INTRAVENOUS | Status: DC
  Administered 2018-04-06: 3 mL/kg/h via INTRAVENOUS

## 2018-04-05 MED ORDER — SUCCINYLCHOLINE CHLORIDE 20 MG/ML IJ SOLN
INTRAMUSCULAR | Status: DC | PRN
  Administered 2018-04-05: 100 mg via INTRAVENOUS

## 2018-04-05 MED ORDER — LABETALOL HCL 5 MG/ML IV SOLN
5.0000 mg | Freq: Once | INTRAVENOUS | Status: AC
  Administered 2018-04-05: 5 mg via INTRAVENOUS

## 2018-04-05 MED ORDER — LACTATED RINGERS IV SOLN
INTRAVENOUS | Status: DC
  Administered 2018-04-05 – 2018-04-06 (×2): via INTRAVENOUS

## 2018-04-05 MED ORDER — ESMOLOL HCL 100 MG/10ML IV SOLN
INTRAVENOUS | Status: DC | PRN
  Administered 2018-04-05 (×3): 20 mg via INTRAVENOUS

## 2018-04-05 MED ORDER — PROMETHAZINE HCL 25 MG/ML IJ SOLN
6.2500 mg | INTRAMUSCULAR | Status: DC | PRN
  Administered 2018-04-05: 6.25 mg via INTRAVENOUS

## 2018-04-05 MED ORDER — HALOPERIDOL LACTATE 5 MG/ML IJ SOLN
1.0000 mg | Freq: Once | INTRAMUSCULAR | Status: DC
  Filled 2018-04-05: qty 0.2

## 2018-04-05 MED ORDER — SODIUM CHLORIDE 0.9 % WEIGHT BASED INFUSION
1.0000 mL/kg/h | INTRAVENOUS | Status: DC
  Administered 2018-04-06: 1 mL/kg/h via INTRAVENOUS

## 2018-04-05 MED ORDER — BUPROPION HCL ER (XL) 150 MG PO TB24
150.0000 mg | ORAL_TABLET | Freq: Every day | ORAL | Status: DC
  Administered 2018-04-05 – 2018-04-07 (×3): 150 mg via ORAL
  Filled 2018-04-05 (×4): qty 1

## 2018-04-05 MED ORDER — CHLORTHALIDONE 25 MG PO TABS
25.0000 mg | ORAL_TABLET | Freq: Every day | ORAL | Status: DC
  Administered 2018-04-05 – 2018-04-07 (×3): 25 mg via ORAL
  Filled 2018-04-05 (×4): qty 1

## 2018-04-05 MED ORDER — ONDANSETRON HCL 4 MG/2ML IJ SOLN: 4.0000 mg | Freq: Four times a day (QID) | INTRAMUSCULAR | Status: DC | PRN

## 2018-04-05 MED ORDER — LIDOCAINE 2% (20 MG/ML) 5 ML SYRINGE
INTRAMUSCULAR | Status: DC | PRN
  Administered 2018-04-05: 100 mg via INTRAVENOUS

## 2018-04-05 MED ORDER — SUCCINYLCHOLINE CHLORIDE 200 MG/10ML IV SOSY
PREFILLED_SYRINGE | INTRAVENOUS | Status: AC
  Filled 2018-04-05: qty 10

## 2018-04-05 MED ORDER — BOOST / RESOURCE BREEZE PO LIQD CUSTOM: 1.0000 | Freq: Three times a day (TID) | ORAL | Status: DC

## 2018-04-05 MED ORDER — SODIUM CHLORIDE 0.9% FLUSH: 3.0000 mL | INTRAVENOUS | Status: DC | PRN

## 2018-04-05 MED ORDER — OXYCODONE-ACETAMINOPHEN 5-325 MG PO TABS
1.0000 | ORAL_TABLET | Freq: Four times a day (QID) | ORAL | Status: DC | PRN
  Administered 2018-04-05 – 2018-04-07 (×6): 1 via ORAL
  Filled 2018-04-05 (×6): qty 1

## 2018-04-05 MED ORDER — OXYCODONE HCL 5 MG PO TABS
ORAL_TABLET | ORAL | Status: AC
  Filled 2018-04-05: qty 1

## 2018-04-05 MED ORDER — SUCRALFATE 1 GM/10ML PO SUSP
1.0000 g | Freq: Every day | ORAL | Status: DC
  Administered 2018-04-06 – 2018-04-07 (×2): 1 g via ORAL
  Filled 2018-04-05 (×2): qty 10

## 2018-04-05 MED ORDER — PANTOPRAZOLE SODIUM 40 MG PO TBEC
40.0000 mg | DELAYED_RELEASE_TABLET | Freq: Two times a day (BID) | ORAL | Status: DC
  Administered 2018-04-05 – 2018-04-07 (×4): 40 mg via ORAL
  Filled 2018-04-05 (×4): qty 1

## 2018-04-05 MED ORDER — FENTANYL CITRATE (PF) 250 MCG/5ML IJ SOLN
INTRAMUSCULAR | Status: AC
  Filled 2018-04-05: qty 5

## 2018-04-05 MED ORDER — PROMETHAZINE HCL 25 MG/ML IJ SOLN
INTRAMUSCULAR | Status: AC
  Filled 2018-04-05: qty 1

## 2018-04-05 MED ORDER — CEFAZOLIN SODIUM-DEXTROSE 2-4 GM/100ML-% IV SOLN
2.0000 g | Freq: Once | INTRAVENOUS | Status: AC
  Administered 2018-04-05: 2 g via INTRAVENOUS
  Filled 2018-04-05: qty 100

## 2018-04-05 MED ORDER — SODIUM CHLORIDE 0.9 % IV SOLN: 250.0000 mL | INTRAVENOUS | Status: DC | PRN

## 2018-04-05 MED ORDER — SERTRALINE HCL 100 MG PO TABS
100.0000 mg | ORAL_TABLET | Freq: Every day | ORAL | Status: DC
  Administered 2018-04-05 – 2018-04-06 (×2): 100 mg via ORAL
  Filled 2018-04-05 (×2): qty 1

## 2018-04-05 SURGICAL SUPPLY — 41 items
ALCOHOL 70% 16 OZ (MISCELLANEOUS) ×2 IMPLANT
ATTRACTOMAT 16X20 MAGNETIC DRP (DRAPES) ×2 IMPLANT
BLADE 10 SAFETY STRL DISP (BLADE) IMPLANT
BLADE SURG 15 STRL LF DISP TIS (BLADE) ×2 IMPLANT
BLADE SURG 15 STRL SS (BLADE) ×4
CANISTER SUCT 3000ML PPV (MISCELLANEOUS) ×2 IMPLANT
COVER SURGICAL LIGHT HANDLE (MISCELLANEOUS) ×2 IMPLANT
COVER WAND RF STERILE (DRAPES) IMPLANT
CRADLE DONUT ADULT HEAD (MISCELLANEOUS) ×1 IMPLANT
GAUZE 4X4 16PLY RFD (DISPOSABLE) ×2 IMPLANT
GAUZE PACKING FOLDED 2  STR (GAUZE/BANDAGES/DRESSINGS) ×2
GAUZE PACKING FOLDED 2 STR (GAUZE/BANDAGES/DRESSINGS) ×1 IMPLANT
GLOVE BIO SURGEON STRL SZ 6.5 (GLOVE) ×2 IMPLANT
GLOVE BIOGEL PI IND STRL 8 (GLOVE) IMPLANT
GLOVE BIOGEL PI INDICATOR 8 (GLOVE)
GLOVE SURG ORTHO 8.0 STRL STRW (GLOVE) ×2 IMPLANT
GOWN STRL REUS W/TWL 2XL LVL3 (GOWN DISPOSABLE) ×2 IMPLANT
HEMOSTAT SURGICEL .5X2 ABSORB (HEMOSTASIS) IMPLANT
HEMOSTAT SURGICEL 2X14 (HEMOSTASIS) IMPLANT
KIT BASIN OR (CUSTOM PROCEDURE TRAY) IMPLANT
KIT TURNOVER KIT B (KITS) ×2 IMPLANT
NDL BLUNT 16X1.5 OR ONLY (NEEDLE) ×1 IMPLANT
NDL DENTAL 27 LONG (NEEDLE) IMPLANT
NEEDLE BLUNT 16X1.5 OR ONLY (NEEDLE) ×2 IMPLANT
NEEDLE DENTAL 27 LONG (NEEDLE) IMPLANT
NS IRRIG 1000ML POUR BTL (IV SOLUTION) ×2 IMPLANT
PACK EENT II TURBAN DRAPE (CUSTOM PROCEDURE TRAY) ×2 IMPLANT
PAD ARMBOARD 7.5X6 YLW CONV (MISCELLANEOUS) ×4 IMPLANT
SPONGE SURGIFOAM ABS GEL 100 (HEMOSTASIS) IMPLANT
SPONGE SURGIFOAM ABS GEL 12-7 (HEMOSTASIS) IMPLANT
SPONGE SURGIFOAM ABS GEL SZ50 (HEMOSTASIS) ×1 IMPLANT
SUCTION FRAZIER HANDLE 10FR (MISCELLANEOUS) ×1
SUCTION TUBE FRAZIER 10FR DISP (MISCELLANEOUS) ×1 IMPLANT
SUT CHROMIC 3 0 PS 2 (SUTURE) ×4 IMPLANT
SUT CHROMIC 4 0 P 3 18 (SUTURE) IMPLANT
SYR 50ML SLIP (SYRINGE) ×2 IMPLANT
TOWEL NATURAL 10PK STERILE (DISPOSABLE) ×2 IMPLANT
TUBE CONNECTING 12X1/4 (SUCTIONS) ×2 IMPLANT
WATER STERILE IRR 1000ML POUR (IV SOLUTION) ×2 IMPLANT
WATER TABLETS ICX (MISCELLANEOUS) ×2 IMPLANT
YANKAUER SUCT BULB TIP NO VENT (SUCTIONS) ×2 IMPLANT

## 2018-04-05 NOTE — Discharge Instructions (Signed)

## 2018-04-05 NOTE — Anesthesia Procedure Notes (Signed)
Arterial Line Insertion Start/End12/02/2018 8:11 AM, 04/05/2018 8:15 AM Performed by: Rosalio MacadamiaHayes,  T, CRNA, CRNA  Patient location: Pre-op. Preanesthetic checklist: patient identified, IV checked, site marked, risks and benefits discussed, surgical consent, monitors and equipment checked, pre-op evaluation, timeout performed and anesthesia consent Lidocaine 1% used for infiltration radial was placed Catheter size: 20 G Hand hygiene performed  and maximum sterile barriers used   Attempts: 1 Procedure performed without using ultrasound guided technique. Following insertion, dressing applied. Patient tolerated the procedure well with no immediate complications.

## 2018-04-05 NOTE — H&P (Signed)
04/05/2018  Patient:            Sherri Mcmillan Loss Date of Birth:  [birth year] MRN:                [id number]   LMP 04/26/1948    Brock Judie Mcmillan Raul Dellston is an 84 year old female with severe aortic stenosis with anticipated TAVR procedure.  Patient was seen as part of pre-heart valve surgery dental particle examination.  Patient now presents for multiple extractions with available lasting and gross to being remaining dentition in the operating room with general anesthesia.  Patient denies any acute medical or dental changes.  Please see note from Dr. Levora DredgeJustin Helberg on 03/30/2018 to act as H&P for the dental operating room procedure.  Charlynne Panderonald F Kulinski, DDS   [facility] Medicine Clinic Attending  Case discussed with Dr. Sunday CornHelbergat the time of the visit. We reviewed the resident's history and exam and pertinent patient test results. I agree with the assessment, diagnosis, and plan of care documented in the resident's note.      Progress Notes by Levora DredgeHelberg, Justin, MD at 03/30/2018 1:15 PM  Author: Levora DredgeHelberg, Justin, MD Author Type: Resident Filed: 03/30/2018 3:23 PM  Note Status: Signed Cosign: Cosign Not Required Encounter Date: 03/30/2018  Editor: Levora DredgeHelberg, Justin, MD (Resident)      CC: F/U on Anemia  HPI:  [name] Raul Dellston is a 84 y.o. female who presented to the clinic for continued evaluation and management of her chronic medical illnesses. For a detailed assessment and plan please refer to problem based charting below.       Past Medical History:  Diagnosis Date  . Anemia 12/14/2012  . Ataxia 06/24/2016  . Auditory hallucinations 08/19/2010   Has has auditory hallucinations, which seem to have worsened since death of her sister Sep 2011. [facility]s [facility] early 2000[facility]  for 6 weeks after mother died, also with hallucinations.  B/C of severe mental illness and refusal to see pysch, I have refused to refill controlled meds. If she decides to pursue mental health, then she needs to take  the initiative and sch the appt bc [name] has s  . Barrett's esophagus    Demonstrated on EGD 12/2010. EGD 09/17/2012 shows inflamed GE junctional mucosa without metaplasia, dysplasia, or malignancy.  . Cataract   . Chronic anxiety    [facility] early 2000[facility]  for 6 weeks after mother died. Complicated again by death of her sister 2010. 2012 developed hallucinations and I refused to refill controlled meds unless she see psych which she is not agreable to  . Chronic insomnia   . Depression   . DVT (deep venous thrombosis) (HCC)   . Gastroparesis    Demonstrated on GES 12/2010 by Dr Dalene SeltzerKapland  . GERD (gastroesophageal reflux disease)   . H/O hiatal hernia   . HLD (hyperlipidemia) 01/2010  . HTN (hypertension)    Controlled with 2 drug therapy  . Left leg DVT (HCC) 09/28/2012  . Migraine   . Muscle spasm 09/29/2017  . Severe aortic stenosis    Review of Systems:  12 point ROS preformed. All negative aside from those mentioned in the HPI.  Physical Exam:    Vitals:   03/30/18 1407  BP: (!) 146/75  Pulse: (!) 57  Temp: 98 F (36.7 C)  TempSrc: Oral  SpO2: 100%  Weight: 121 lb (54.9 kg)  Height: 5' (1.524 Mcmillan)   General: Elderly female in no acute distress Pulm: Good air  movement with no wheezing or crackles  CV: RRR, crescendo-decrescendo systolic murmur Abdomen: Active bowel sounds, soft, non-distended, no tenderness to palpation   Assessment & Plan:   See Encounters Tab for problem based charting.  Patient discussed with Dr. Heide Spark    Assessment & Plan Note by Levora Dredge, MD at 03/30/2018 3:21 PM  Author: Levora Dredge, MD Author Type: Resident Filed: 03/30/2018 3:23 PM  Note Status: Written Cosign: Cosign Not Required Encounter Date: 03/30/2018  Problem: Iron deficiency anemia due to chronic blood loss  Editor: Levora Dredge, MD (Resident)    Patient with iron deficiency anemia secondary to errosive gastritis and hiatal hernia.  Planning for aortic valve replacement and hiatal hernia repair. We are attempting to maximize hermetically prior to the surgical interventions. She has received two doses of IV iron in two units of packed red cells. We will check on H&H today. If her hemoglobin is approximately 9 to 10 I will message doctors Verne Carrow, Cindra Eves, and Amada Jupiter III so that the patient may proceed with upcoming surgeries.    Assessment & Plan Note by Levora Dredge, MD at 03/30/2018 3:20 PM  Author: Levora Dredge, MD Author Type: Resident Filed: 03/30/2018 3:21 PM  Note Status: Carlisle Cater: Cosign Not Required Encounter Date: 03/30/2018  Problem: Severe aortic stenosis  Editor: Levora Dredge, MD (Resident)  Prior Versions: 1. Levora Dredge, MD (Resident) at 03/30/2018 3:20 PM - Written    Patient preparing for aortic valve replacement with Dr. Verne Carrow. Trying to optimize the patient medically prior to surgical intervention.    Assessment & Plan Note by Levora Dredge, MD at 03/30/2018 3:20 PM  Author: Levora Dredge, MD Author Type: Resident Filed: 03/30/2018 3:21 PM  Note Status: Written Cosign: Cosign Not Required Encounter Date: 03/30/2018  Problem: GERD (gastroesophageal reflux disease)  Editor: Levora Dredge, MD (Resident)    Patient with uncontrolled Sherri Mcmillan secondary to a hiatal hernia. His following up with Jeryl Columbia and planning for surgery within the coming months. This will be performed after the patient has undergone an aortic valve replacement. We are optimizing the patient medically prior to surgical intervention.    Patient Instructions by Levora Dredge, MD at 03/30/2018 1:15 PM  Author: Levora Dredge, MD Author Type: Resident Filed: 03/30/2018 2:31 PM  Note Status: Signed Cosign: Cosign Not Required Encounter Date: 03/30/2018  Editor: Levora Dredge, MD (Resident)    Thank you for allowing [place] to provide your care. I will call you with the results of  your blood work tomorrow. As long as everything is looking well I will let your cardiologist and your dentist know so we can proceed with surgery. Please do not hesitate to call if you have any questions or concerns. I will see you back in three months.    Instructions      Return in about 3 months (around 06/29/2018).  Thank you for allowing [place] to provide your care. I will call you with the results of your blood work tomorrow. As long as everything is looking well I will let your cardiologist and your dentist know so we can proceed with surgery. Please do not hesitate to call if you have any questions or concerns. I will see you back in three months.       After Visit Summary (Printed 03/30/2018)

## 2018-04-05 NOTE — Transfer of Care (Signed)
Immediate Anesthesia Transfer of Care Note  Patient: Sherri Mcmillan  Procedure(s) Performed: Extraction of tooth #'s 6 and 29 with alveoloplasty and gross debridement of remaining teeth (Bilateral )  Patient Location: PACU  Anesthesia Type:General  Level of Consciousness: patient cooperative and responds to stimulation  Airway & Oxygen Therapy: Patient Spontanous Breathing and Patient connected to face mask oxygen  Post-op Assessment: Report given to RN and Post -op Vital signs reviewed and stable  Post vital signs: Reviewed and stable  Last Vitals:  Vitals Value Taken Time  BP 183/75 04/05/2018  9:56 AM  Temp 36.1 C 04/05/2018  9:56 AM  Pulse 58 04/05/2018 10:03 AM  Resp 12 04/05/2018 10:03 AM  SpO2 98 % 04/05/2018 10:03 AM  Vitals shown include unvalidated device data.  Last Pain:  Vitals:   04/05/18 0722  TempSrc:   PainSc: 8       Patients Stated Pain Goal: 3 (04/05/18 16100722)  Complications: No apparent anesthesia complications

## 2018-04-05 NOTE — Anesthesia Postprocedure Evaluation (Signed)
Anesthesia Post Note  Patient: Sherri DixonMattie M Mcmillan  Procedure(s) Performed: Extraction of tooth #'s 6 and 29 with alveoloplasty and gross debridement of remaining teeth (Bilateral )     Patient location during evaluation: PACU Anesthesia Type: General Level of consciousness: awake and alert Pain management: pain level controlled Vital Signs Assessment: post-procedure vital signs reviewed and stable Respiratory status: spontaneous breathing, nonlabored ventilation and respiratory function stable Cardiovascular status: blood pressure returned to baseline and stable Postop Assessment: no apparent nausea or vomiting Anesthetic complications: no    Last Vitals:  Vitals:   04/05/18 1200 04/05/18 1300  BP: (!) 158/76 (!) 150/81  Pulse: (!) 57 71  Resp: 14 19  Temp:    SpO2: 100% 100%    Last Pain:  Vitals:   04/05/18 1300  TempSrc:   PainSc: Asleep                 Kaylyn LayerKathryn E 

## 2018-04-05 NOTE — Progress Notes (Signed)
PRE-OPERATIVE NOTE:  04/05/2018 Sherri DixonMattie M Mcmillan [id number]  VITALS: LMP 04/26/1948   Lab Results  Component Value Date   WBC 3.6 (L) 04/04/2018   HGB 10.7 (L) 04/04/2018   HCT 34.4 (L) 04/04/2018   MCV 78.5 (L) 04/04/2018   PLT 137 (L) 04/04/2018   BMET    Component Value Date/Time   NA 143 04/04/2018 0918   NA 141 03/01/2018 1659   K 5.3 (H) 04/04/2018 0918   CL 110 04/04/2018 0918   CO2 23 04/04/2018 0918   GLUCOSE 103 (H) 04/04/2018 0918   BUN 16 04/04/2018 0918   BUN 13 03/01/2018 1659   CREATININE 1.00 04/04/2018 0918   CREATININE 0.83 04/10/2014 1426   CALCIUM 9.2 04/04/2018 0918   GFRNONAA 52 (L) 04/04/2018 0918   GFRNONAA 67 04/10/2014 1426   GFRAA >60 04/04/2018 0918   GFRAA 78 04/10/2014 1426    Lab Results  Component Value Date   INR 1.03 09/20/2017   INR 1.09 06/24/2016   INR 1.10 06/24/2016   No results found for: PTT   Sherri Mcmillan presents for multiple dental extractions with alveoloplasty and gross debridement of remaining dentition in the operating room with general anesthesia .   SUBJECTIVE: The patient denies any acute medical or dental changes and agrees to proceed with treatment as planned.  EXAM: No sign of acute dental changes.  ASSESSMENT: Patient is affected by history of dental phobia, chronic periodontitis, loose teeth, retained root segment,  and Accretions.  PLAN: Patient agrees to proceed with treatment as planned in the operating room as previously discussed and accepts the risks, benefits, and complications of the proposed treatment. Patient is aware of the risk for bleeding, bruising, swelling, infection, pain, nerve damage, soft tissue damage, damage to adjacent teeth, sinus involvement, root tip fracture, mandible fracture, and the risks of complications associated with the anesthesia. Patient also is aware of the potential for other complications up to and including death due to her overall cardiovascular and respiratory  compromise.     [name] , DDS

## 2018-04-05 NOTE — Op Note (Signed)
OPERATIVE REPORT  Patient:            Sherri Mcmillan Date of Birth:  [birth year] MRN:                [id number]   DATE OF PROCEDURE:  04/05/2018  PREOPERATIVE DIAGNOSES: 1.  Severe aortic stenosis 2.  Pre-heart valve surgery dental protocol 3.  Retained root segment 4.  Dental caries 5.  Chronic periodontitis 6.  Accretions 7.  Tooth mobility 8.  Dental phobia  POSTOPERATIVE DIAGNOSES: 1.  Severe aortic stenosis 2.  Pre-heart valve surgery dental protocol 3.  Retained root segment 4.  Dental caries 5.  Chronic periodontitis 6.  Accretions 7.  Tooth mobility 8.  Dental phobia  OPERATIONS: 1. Multiple extraction of tooth numbers 6 and 29 2. 2 Quadrants of alveoloplasty 3. Gross debridement of remaining dentition   SURGEON: Charlynne Pander, DDS  ASSISTANT: Pearletha Alfred (dental assistant)  ANESTHESIA: General anesthesia via oral endotracheal tube.  MEDICATIONS: 1. Ancef 2 g IV prior to invasive dental procedures. 2. Local anesthesia with a total utilization of 3 carpules each containing 34 mg of lidocaine with 0.017 mg of epinephrine as well as 1 carpule each containing 9 mg of bupivacaine with 0.009 mg of epinephrine.  SPECIMENS: There are 2 teeth that were discarded.  DRAINS: None  CULTURES: None  COMPLICATIONS: None  ESTIMATED BLOOD LOSS: Less than 50 mLs.  INTRAVENOUS FLUIDS: 400 mLs of Lactated ringers solution.  INDICATIONS: The patient was recently diagnosed with severe aortic stenosis.  A medically necessary dental consultation was then requested to evaluate poor dentition.  The patient was examined and treatment planned for multiple extractions with alveoloplasty and gross debridement of remaining dentition in the operating with general anesthesia.  This treatment plan was formulated to decrease the risks and complications associated with dental infection from affecting the patient's systemic health and anticipated heart valve  surgery.  OPERATIVE FINDINGS: Patient was examined operating room number 18.  The teeth were identified for extraction.  The patient had been offered treatment options to include extraction of all remaining teeth, extraction all maxillary teeth and tooth #29, and extraction of tooth numbers 6 and 29 only.  The patient only wanted tooth #6 and 29 extracted at this time along with the gross debridement of remaining dentition in the operating room and general anesthesia.  The patient was noted to be affected by retained root segment, dental caries, chronic periodontitis, loose teeth, and dental phobia.     DESCRIPTION OF PROCEDURE: Patient was brought to the main operating room number 18. Patient was then placed in the supine position on the operating table. General anesthesia was then induced per the anesthesia team. The patient was then prepped and draped in the usual manner for a dental medicine procedure. A timeout was performed. The patient was identified and procedures were verified. A throat pack was placed at this time. The oral cavity was then thoroughly examined with the findings noted above. The patient was then ready for dental medicine procedure as follows:  Local anesthesia was then administered sequentially with a total utilization of 3 carpules each containing 34 mg of lidocaine with 0.017 mg of epinephrine as well as 1 carpules  each containing 9 mg bupivacaine with 0.009 mg of epinephrine.  The Maxillary right quadrant was first approached. Anesthesia was then delivered utilizing infiltration with lidocaine with epinephrine. A #15 blade incision was then made from the mesial of #4 and extended to the mesial of #  8.  A  surgical flap was then carefully reflected.  Tooth #6 was then subluxated and removed with a 150 forceps without complication.  The socket was curetted and compressed appropriately.  Alveoloplasty was then performed utilizing rongeurs and bone file to help achieve primary  closure.  The surgical site was then irrigated with copious amounts of sterile saline.  A piece of Surgifoam was placed in the extraction socket appropriately.  The surgical site was then closed from the mesial #4 and extended the mesial #8 utilizing 3-0 Chromic Gut suture in a continuous interrupted suture technique x1.    At this point in time, the mandibular right quadrant was approached. The patient was given an inferior alveolar nerve block and long buccal nerve block utilizing the bupivacaine with epinephrine. Further infiltration was then achieved utilizing the lidocaine with epinephrine. A 15 blade incision was then made from the distal of number of #30 and extended the mesial #27.  A surgical flap was then carefully reflected. Tooth number 29 was then subluxated and removed with a 151 forceps without complications.  The socket was curetted and compressed appropriately.  Alveoloplasty was then performed using a rongeurs and bone file to help achieve primary closure.  The surgical site was irrigated with copious amounts sterile saline.  Tissues were approximated and trimmed appropriately.  A piece of Surgifoam was placed in the extraction socket appropriately.  The surgical site was then closed from the distal of #30 to the  distal #27 utilizing 3-0 Chromic Gut suture in a continuous erupted suture technique x1.    At this point in time, the gross debridement procedure was performed.  A sonic scaler was used remove Accretions.  A series of hand curettes were then used to further remove Accretions.  The sonic scaler was then again used to further refine the removal of accretions.  This completed the gross debridement procedure.  At this point time, the entire mouth was irrigated with copious amounts of sterile saline. The patient was examined for complications, seeing none, the dental medicine procedure was deemed to be complete. The throat pack was removed at this time. An oral airway was then placed at  the request of the anesthesia team. A series of 4 x 4 gauze were placed in the mouth to aid hemostasis. The patient was then handed over to the anesthesia team for final disposition. After an appropriate amount of time, the patient was extubated and taken to the postanesthsia care unit in good condition. All counts were correct for the dental medicine procedure.  The patient will be kept overnight for observation by the cardiology team under Dr. Gibson RampMcalhany's care.  The patient may then be discharged in the morning as indicated.  The patient is to use Tylenol for her postoperative discomfort.  Patient is to call for an appointment for evaluation for suture removal next week with Dental Medicine.   [name] , DDS.

## 2018-04-05 NOTE — Progress Notes (Signed)
Patient with high pressure (see Epic) notified Cardiology Bhagat. Per MD verbal order with read back given for IV Hydralazine every 4 hrs 10 mg. Order placed.    , RN

## 2018-04-05 NOTE — Plan of Care (Signed)

## 2018-04-05 NOTE — H&P (Addendum)
Cardiology Admission History and Physical:   Patient ID: Sherri DixonMattie M Mcmillan MRN: [id number]; DOB: [birth year]   Admission date: 04/05/2018  Primary Care Provider: Levora DredgeHelberg, Justin, MD Primary Cardiologist: Dr. Aundra DubinMcAlhnay  Chief Complaint:  Tooth extraction   Patient Profile:   Sherri DixonMattie M Mcmillan is a 84 y.o. female with Barrett's esophagus with reflux esophagitis, anxiety, depression, DVT, GERD, hiatal hernia, hyperlipidemia, HTN and severe aortic stenosis presented to tooth extractions who required overnight observation as no family to take care of her at home.   She reports a normal cardiac catheterization 20 years ago. She is known to have aortic valve disease. Echo in 2017 showed thickened aortic valve leaflets with moderate aortic insufficiency  History of Present Illness:   [name] seen in our office 02/14/18 by Jacolyn ReedyMichele Lenze, PA-C for pre-operative risk assessment prior to possible hiatal hernia repair. Echo 02/24/18 with normal LV systolic function, LVEF=60-65%. Grade 2 diastolic dysfunction. The aortic valve leaflets are thickened with moderate restriction of leaflet mobility. Mean gradient 40 mmHg, peak gradient 65 mmHg. AVA 0.9 cm2. DVI 0.24. She was referred to valve clinic and seen by Dr. Clifton JamesMcAlhany who felt good candidate for TVAR. She was scheduled for R/L cath on 03/20/18 but cancelled for some reason. Looks like it was cancelled for Hgb of 7.9. Today Hgb of 11.6.   Patient underwent multiple tooth extraction and alveoloplasty by Dr. Kristin BruinsKulinski. She will required overnight observation as no family at home. Will set up for R & L cath with Dr. Excell Seltzerooper tomorrow.   Past Medical History:  Diagnosis Date  . Anemia 12/14/2012  . Ataxia 06/24/2016  . Auditory hallucinations 08/19/2010   Has has auditory hallucinations, which seem to have worsened since death of her sister Sep 2011. [facility]  for 6 weeks after mother died, also with hallucinations.  B/C of severe mental  illness and refusal to see pysch, I have refused to refill controlled meds. If she decides to pursue mental health, then she needs to take the initiative and sch the appt bc [name] has s  . Barrett's esophagus    Demonstrated on EGD 12/2010. EGD 09/17/2012 shows inflamed GE junctional mucosa without metaplasia, dysplasia, or malignancy.  . Cataract   . Chronic anxiety    [facility]  for 6 weeks after mother died. Complicated again by death of her sister 2010. 2012 developed hallucinations and I refused to refill controlled meds unless she see psych which she is not agreable to  . Chronic insomnia   . Depression   . DVT (deep venous thrombosis) (HCC)   . Gastroparesis    Demonstrated on GES 12/2010 by Dr Dalene SeltzerKapland  . GERD (gastroesophageal reflux disease)   . H/O hiatal hernia   . HLD (hyperlipidemia) 01/2010  . HTN (hypertension)    Controlled with 2 drug therapy  . Left leg DVT (HCC) 09/28/2012  . Migraine   . Muscle spasm 09/29/2017  . Severe aortic stenosis     Past Surgical History:  Procedure Laterality Date  . 24 HOUR PH STUDY N/A 08/24/2017   Procedure: 24 HOUR PH STUDY IMPEDANCE;  Surgeon: Sherrilyn Ristanis, Henry L III, MD;  Location: [facility] ENDOSCOPY;  Service: Gastroenterology;  Laterality: N/A;  . ABDOMINAL HYSTERECTOMY    . APPENDECTOMY    . CATARACT EXTRACTION     left eye  . COLONOSCOPY  2000&2005   Dr.Magod  . DIRECT LARYNGOSCOPY   September 2008  preoperative diagnosis hoarseness with anterior right vocal cord lesion -  direct laryngoscopy and excisional biopsy of right anterior vocal cord lesion done by Dr. Ezzard Standing  . ENDOVENOUS ABLATION SAPHENOUS VEIN W/ LASER Left 05-09-2014   EVLA left small saphenous vein by Gretta Began MD  . ESOPHAGEAL MANOMETRY N/A 08/24/2017   Procedure: ESOPHAGEAL MANOMETRY (EM);  Surgeon: Sherrilyn Rist, MD;  Location: [facility] ENDOS[facility];  Service: Gastroenterology;  Laterality: N/A;  . ESOPHAGOGASTRODUODENOSCOPY  11/2010  .  ESOPHAGOGASTRODUODENOSCOPY N/A 09/17/2012   Procedure: ESOPHAGOGASTRODUODENOSCOPY (EGD);  Surgeon: Hart Carwin, MD;  Location[facility]COPY;  Service: Endoscopy;  Laterality: N/A;  . ESOPHAGOGASTRODUODENOSCOPY N/A 12/17/2012   Procedure: ESOPHAGOGASTRODUODENOSCOPY (EGD);  Surgeon: Beverley Fiedler, MD;  Location: [facility]COPY;  Service: Gastroenterology;  Laterality: N/A;  . EXCISION MASS HEAD N/A 03/05/2016   Procedure: EXCISION POSTERIOR SCALP MASS;  Surgeon: Axel Filler, MD;  Location: [facility] OR;  Service: General;  Laterality: N/A;  . EYE SURGERY    . HEMORRHOID SURGERY    . KNEE ARTHROSCOPY    . LAPAROSCOPIC NISSEN FUNDOPLICATION N/A 05/02/2013   Procedure: LAPAROSCOPIC NISSEN FUNDOPLICATION;  Surgeon: Valarie Merino, MD;  Location: [facility] ORS;  Service: General;  Laterality: N/A;  . MENISCECTOMY   July 2002    preoperative diagnosis torn medial meniscus right knee, partial medial meniscectomy, debridement chondroplasty patellofemoral joint, done by Dr. Madelon Lips  . POLYPECTOMY  2000   Dr.Magod  . ROTATOR CUFF REPAIR  09/21/2011   rt shoulder     Medications Prior to Admission: Prior to Admission medications   Medication Sig Start Date End Date Taking? Authorizing Provider  amLODipine (NORVASC) 10 MG tablet TAKE 1 TABLET BY MOUTH EVERY DAY Patient taking differently: Take 10 mg by mouth daily.  03/01/18  Yes Helberg, Jill Alexanders, MD  buPROPion (WELLBUTRIN XL) 150 MG 24 hr tablet Take 1 tablet (150 mg total) by mouth daily. 03/09/18  Yes Helberg, Jill Alexanders, MD  chlorthalidone (HYGROTON) 25 MG tablet Take 1 tablet (25 mg total) by mouth daily. 03/09/18  Yes Helberg, Justin, MD  [employer] MELATONIN 10 MG CAPS TAKE 10 MG BY MOUTH AT BEDTIME. Patient taking differently: Take 10 mg by mouth at bedtime as needed (sleep).  10/17/17  Yes Helberg, Jill Alexanders, MD  dexlansoprazole (DEXILANT) 60 MG capsule Take 1 capsule (60 mg total) by mouth 2 (two) times daily. Patient taking differently: Take 60 mg by mouth daily.  12/01/17   Yes Helberg, Jill Alexanders, MD  hydroxypropyl methylcellulose / hypromellose (ISOPTO TEARS / GONIOVISC) 2.5 % ophthalmic solution Place 1 drop into both eyes daily as needed for dry eyes.   Yes [provider]  PROAIR HFA 108 (90 Base) MCG/ACT inhaler INHALE 2 PUFFS INTO THE LUNGS EVERY 4 (FOUR) HOURS AS NEEDED FOR WHEEZING OR SHORTNESS OF BREATH. Patient taking differently: Inhale 2 puffs into the lungs every 4 (four) hours as needed for wheezing or shortness of breath.  09/01/17  Yes Helberg, Jill Alexanders, MD  sertraline (ZOLOFT) 50 MG tablet Take 100 mg by mouth at bedtime.    Yes [provider]  sucralfate (CARAFATE) 1 GM/10ML suspension TAKE 10 MLS (1 G TOTAL) BY MOUTH 2 (TWO) TIMES DAILY. Patient taking differently: Take 1 g by mouth daily. TAKE 10 MLS (1 G TOTAL) BY MOUTH 2 (TWO) TIMES DAILY. 03/09/18  Yes Helberg, Jill Alexanders, MD  oxyCODONE (ROXICODONE) 5 MG immediate release tablet Take 1 tablet (5 mg total) by mouth every 6 (six) hours as needed for severe pain. Patient not taking:  Reported on 04/03/2018 03/20/18   Levert Feinstein, MD  ranitidine (ZANTAC) 300 MG tablet Take 1 tablet (300 mg total) by mouth at bedtime. Patient not taking: Reported on 04/03/2018 03/09/18   Levora Dredge, MD     Allergies:    Allergies  Allergen Reactions  . Aspirin Rash    Social History:   Social History   Socioeconomic History  . Marital status: Widowed    Spouse name: Not on file  . Number of children: 1  . Years of education: some college  . Highest education level: Not on file  Occupational History  . Occupation: Retired-worked in [employer]  Social Needs  . Financial resource strain: Hard  . Food insecurity:    Worry: Often true    Inability: Often true  . Transportation needs:    Medical: Yes    Non-medical: No  Tobacco Use  . Smoking status: Former Smoker    Years: 50.00    Types: Cigarettes    Last attempt to quit: 06/02/1983    Years since quitting: 34.8  . Smokeless  tobacco: Never Used  Substance and Sexual Activity  . Alcohol use: No    Alcohol/week: 0.0 standard drinks    Comment: quit 24 years ago  . Drug use: No  . Sexual activity: Not Currently    Birth control/protection: Post-menopausal, None  Lifestyle  . Physical activity:    Days per week: Not on file    Minutes per session: Not on file  . Stress: Not on file  Relationships  . Social connections:    Talks on phone: More than three times a week    Gets together: More than three times a week    Attends religious service: More than 4 times per year    Active member of club or organization: Yes    Attends meetings of clubs or organizations: More than 4 times per year    Relationship status: Widowed  . Intimate partner violence:    Fear of current or ex partner: Not on file    Emotionally abused: Not on file    Physically abused: Not on file    Forced sexual activity: Not on file  Other Topics Concern  . 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 The patient nolonger drinks alcohol.   The patient is widowed.   Patient has an adopted daughter living in [place].   Education: some college.     Family History:   The patient's family history includes Diabetes in her brother; Heart attack in her father and mother; Heart disease in her mother; Hypertension in her father and mother. There is no history of Stroke, Cancer, Colon cancer, Anesthesia problems, Hypotension, Malignant hyperthermia, Pseudochol deficiency, Stomach cancer, Rectal cancer, or Esophageal cancer.    ROS:  Please see the history of present illness.  All other ROS reviewed and negative.     Physical  Exam/Data:   Vitals:   04/05/18 1000 04/05/18 1015 04/05/18 1030 04/05/18 1045  BP:  (!) 211/119 (!) 208/98 (!) 200/106  Pulse: (!) 57 65 60 69  Resp: 15 17  11 17  Temp:      TempSrc:      SpO2:    100%  Weight:      Height:        Intake/Output Summary (Last 24 hours) at 04/05/2018 1142 Last data filed at 04/05/2018 0934 Gross per 24 hour  Intake 500 ml  Output 25 ml  Net 475 ml   Filed Weights   04/05/18 0722  Weight: 53.5 kg   Body mass index is 23.05 kg/m.  General:  Well nourished, well developed, in no acute distress HEENT: Gauge in mouth  Lymph: no adenopathy Neck: noJVD Endocrine:  No thryomegaly Vascular: No carotid bruits; FA pulses 2+ bilaterally without bruits  Cardiac:  normal S1, S2; RRR; 3/6 systolic murmur Lungs:  clear to auscultation bilaterally, no wheezing, rhonchi or rales  Abd: soft, nontender, no hepatomegaly  Ext: no  edema Musculoskeletal:  No deformities, BUE and BLE strength normal and equal Skin: warm and dry  Neuro:  CNs 2-12 intact, no focal abnormalities noted Psych:  Normal affect   EKG:  The ECG that was done 03/20/18 was personally reviewed and demonstrates NSR  Relevant CV Studies: Echo 02/24/18 Study Conclusions  - Left ventricle: The cavity size was normal. There was mild   concentric hypertrophy. Systolic function was normal. The   estimated ejection fraction was in the range of 60% to 65%. Wall   motion was normal; there were no regional wall motion   abnormalities. Features are consistent with a pseudonormal left   ventricular filling pattern, with concomitant abnormal relaxation   and increased filling pressure (grade 2 diastolic dysfunction). - Aortic valve: Valve mobility was moderately restricted. There was   moderate to severe stenosis. There was trivial regurgitation.   Calculated aortic valve area and dimensionless obstructive index   overestimate AS severity due to inaccurate LVOT pulsed wave   sampling  location, Even so, the aortic stenosis is approaching   the severe range. - Pulmonary arteries: Systolic pressure was mildly increased. PA   peak pressure: 36 mm Hg (S).  Impressions:  - Aortic stenosis has worsened since 11/10/2015 and is approaching   severe range.  Stress test 02/24/18  The left ventricular ejection fraction is normal (55-65%).  Nuclear stress EF: 64%.  There was no ST segment deviation noted during stress.  The study is normal.  This is a low risk study.   Normal resting and stress perfusion. No ischemia or infarction EF 64%  Laboratory Data:  Chemistry Recent Labs  Lab 04/04/18 0918 04/05/18 0659  NA 143 142  K 5.3* 3.5  CL 110  --   CO2 23  --   GLUCOSE 103* 87  BUN 16  --   CREATININE 1.00  --   CALCIUM 9.2  --   GFRNONAA 52*  --   GFRAA >60  --   ANIONGAP 10  --     No results for input(s): PROT, ALBUMIN, AST, ALT, ALKPHOS, BILITOT in the last 168 hours. Hematology Recent Labs  Lab 04/04/18 0918 04/05/18 0659  WBC 3.6*  --   RBC 4.38  --   HGB 10.7* 11.6*  HCT 34.4* 34.0*  MCV 78.5*  --   MCH 24.4*  --   MCHC 31.1  --   RDW 24.8*  --   PLT 137*  --    Radiology/Studies:  No results found.  Assessment and Plan:   1. Severe Aortic stenosis - Undergoing TVAR work up. S/p multiple tooth  extraction and alveoloplasty by Dr. Kristin Bruins today. Will admit for overnight observation and paln for R & L cath with Dr. Excell Seltzer tomorrow. May Consider other TVAR workup while here if transportation is issue.   2. GERD with hiatal hernia - Barrett's esophagus with reflux esophagitis - She also has uncontrolled GERD 2nd to Hiatal hernia. She is follow by Dr. Jeryl Columbia. Plan performed after the patient has undergone an aortic valve replacement per note.  - Continue Desilant  3. HTN - Continue home medication  4. Recent anemia with IDA - recently anemic with hgb of less than 8 requiring IV Iron and 2 unit of PRBCs. Hgb stable here at  11.6. Will need close follow up post cath.   Severity of Illness: The appropriate patient status for this patient is OBSERVATION. Observation status is judged to be reasonable and necessary in order to provide the required intensity of service to ensure the patient's safety. The patient's presenting symptoms, physical exam findings, and initial radiographic and laboratory data in the context of their medical condition is felt to place them at decreased risk for further clinical deterioration. Furthermore, it is anticipated that the patient will be medically stable for discharge from the hospital within 2 midnights of admission. The following factors support the patient status of observation.   " The patient's presenting symptoms include tooth extraction, severe AS, anemia. " The physical exam findings include Tooth extraction  " The initial radiographic and laboratory data are N/A     For questions or updates, please contact [facility] Please consult [email or web address] for contact info under   Lorelei Pont, PA  04/05/2018 11:42 AM   Patient seen, examined. Available data reviewed. Agree with findings, assessment, and plan as outlined by Chelsea Aus, PA-C. On my exam the patient's face is swollen after having her teeth pulled. Lungs are clear. Heart is RRR With a 3/6 harsh systolic murmur at the RUSB, abdomen is soft and NT, extremities have no edema. The patient is admitted after undergoing dental extraction. We plan to proceed with R/L heart cath tomorrow for TAVR planning. Risks/indications/alternatives have been reviewed with the patient who understands and agrees to proceed. She was previously scheduled for cardiac cath and it was cancelled because of iron deficiency anemia, but hemoglobin now improved after treatment. Will repeat labs in am prior to cath.  Tonny Bollman, M.D. 04/05/2018 6:24 PM

## 2018-04-06 ENCOUNTER — Encounter (HOSPITAL_COMMUNITY)
Admission: RE | Disposition: A | Discharge status: Home / Self Care | Payer: Self-pay | Source: Home / Self Care | Attending: Cardiovascular Disease

## 2018-04-06 ENCOUNTER — Other Ambulatory Visit: Payer: Self-pay

## 2018-04-06 ENCOUNTER — Ambulatory Visit (HOSPITAL_COMMUNITY): Admission: RE | Admit: 2018-04-06 | Payer: Medicare Other | Source: Home / Self Care | Admitting: Cardiovascular Disease

## 2018-04-06 ENCOUNTER — Encounter (HOSPITAL_COMMUNITY): Payer: Self-pay | Admitting: Dentistry

## 2018-04-06 DIAGNOSIS — Z01810 Encounter for preprocedural cardiovascular examination: Secondary | ICD-10-CM | POA: Diagnosis not present

## 2018-04-06 DIAGNOSIS — I350 Nonrheumatic aortic (valve) stenosis: Secondary | ICD-10-CM

## 2018-04-06 DIAGNOSIS — K08199 Complete loss of teeth due to other specified cause, unspecified class: Secondary | ICD-10-CM

## 2018-04-06 HISTORY — PX: RIGHT/LEFT HEART CATH AND CORONARY ANGIOGRAPHY: CATH118266

## 2018-04-06 LAB — POCT I-STAT 3, VENOUS BLOOD GAS (G3P V)
BICARBONATE: 26.1 mmol/L (ref 20.0–28.0)
O2 SAT: 74 %
TCO2: 28 mmol/L (ref 22–32)
pCO2, Ven: 48.2 mmHg (ref 44.0–60.0)
pH, Ven: 7.341 (ref 7.250–7.430)
pO2, Ven: 42 mmHg (ref 32.0–45.0)

## 2018-04-06 LAB — CBC
HCT: 35.7 % — ABNORMAL LOW (ref 36.0–46.0)
Hemoglobin: 10.5 g/dL — ABNORMAL LOW (ref 12.0–15.0)
MCH: 22.7 pg — ABNORMAL LOW (ref 26.0–34.0)
MCHC: 29.4 g/dL — ABNORMAL LOW (ref 30.0–36.0)
MCV: 77.1 fL — ABNORMAL LOW (ref 80.0–100.0)
Platelets: 102 10*3/uL — ABNORMAL LOW (ref 150–400)
RBC: 4.63 MIL/uL (ref 3.87–5.11)
RDW: 24 % — ABNORMAL HIGH (ref 11.5–15.5)
WBC: 7.2 10*3/uL (ref 4.0–10.5)
nRBC: 0 % (ref 0.0–0.2)

## 2018-04-06 LAB — BASIC METABOLIC PANEL
Anion gap: 9 (ref 5–15)
BUN: 12 mg/dL (ref 8–23)
CO2: 26 mmol/L (ref 22–32)
Calcium: 9.2 mg/dL (ref 8.9–10.3)
Chloride: 101 mmol/L (ref 98–111)
Creatinine, Ser: 1.14 mg/dL — ABNORMAL HIGH (ref 0.44–1.00)
GFR calc Af Amer: 51 mL/min — ABNORMAL LOW (ref >60)
GFR calc non Af Amer: 44 mL/min — ABNORMAL LOW (ref >60)
Glucose, Bld: 111 mg/dL — ABNORMAL HIGH (ref 70–99)
Potassium: 3.6 mmol/L (ref 3.5–5.1)
SODIUM: 136 mmol/L (ref 135–145)

## 2018-04-06 SURGERY — RIGHT/LEFT HEART CATH AND CORONARY ANGIOGRAPHY
Anesthesia: LOCAL

## 2018-04-06 MED ORDER — HEPARIN (PORCINE) IN NACL 1000-0.9 UT/500ML-% IV SOLN
INTRAVENOUS | Status: AC
  Filled 2018-04-06: qty 1000

## 2018-04-06 MED ORDER — MIDAZOLAM HCL 2 MG/2ML IJ SOLN
INTRAMUSCULAR | Status: DC | PRN
  Administered 2018-04-06 (×2): 1 mg via INTRAVENOUS

## 2018-04-06 MED ORDER — SODIUM CHLORIDE 0.9 % IV SOLN: 250.0000 mL | INTRAVENOUS | Status: DC | PRN

## 2018-04-06 MED ORDER — SODIUM CHLORIDE 0.9% FLUSH
3.0000 mL | Freq: Two times a day (BID) | INTRAVENOUS | Status: DC
  Administered 2018-04-06 – 2018-04-07 (×2): 3 mL via INTRAVENOUS

## 2018-04-06 MED ORDER — HEPARIN (PORCINE) IN NACL 1000-0.9 UT/500ML-% IV SOLN
INTRAVENOUS | Status: DC | PRN
  Administered 2018-04-06: 500 mL

## 2018-04-06 MED ORDER — ADULT MULTIVITAMIN W/MINERALS CH
1.0000 | ORAL_TABLET | Freq: Every day | ORAL | Status: DC
  Administered 2018-04-06 – 2018-04-07 (×2): 1 via ORAL
  Filled 2018-04-06 (×2): qty 1

## 2018-04-06 MED ORDER — IOHEXOL 350 MG/ML SOLN
INTRAVENOUS | Status: DC | PRN
  Administered 2018-04-06: 40 mL via INTRAVENOUS

## 2018-04-06 MED ORDER — MIDAZOLAM HCL 2 MG/2ML IJ SOLN
INTRAMUSCULAR | Status: AC
  Filled 2018-04-06: qty 2

## 2018-04-06 MED ORDER — SODIUM CHLORIDE 0.9 % IR SOLN
200.0000 mL | Status: DC
  Administered 2018-04-06 – 2018-04-07 (×9): 200 mL

## 2018-04-06 MED ORDER — FENTANYL CITRATE (PF) 100 MCG/2ML IJ SOLN
INTRAMUSCULAR | Status: DC | PRN
  Administered 2018-04-06 (×2): 25 ug via INTRAVENOUS

## 2018-04-06 MED ORDER — ENSURE ENLIVE PO LIQD: 237.0000 mL | Freq: Two times a day (BID) | ORAL | Status: DC

## 2018-04-06 MED ORDER — HEPARIN SODIUM (PORCINE) 1000 UNIT/ML IJ SOLN
INTRAMUSCULAR | Status: DC | PRN
  Administered 2018-04-06: 3000 [IU] via INTRAVENOUS

## 2018-04-06 MED ORDER — LIDOCAINE HCL (PF) 1 % IJ SOLN
INTRAMUSCULAR | Status: AC
  Filled 2018-04-06: qty 30

## 2018-04-06 MED ORDER — CHLORHEXIDINE GLUCONATE 0.12 % MT SOLN
15.0000 mL | Freq: Two times a day (BID) | OROMUCOSAL | Status: DC
  Administered 2018-04-06 – 2018-04-07 (×2): 15 mL via OROMUCOSAL
  Filled 2018-04-06 (×2): qty 15

## 2018-04-06 MED ORDER — VERAPAMIL HCL 2.5 MG/ML IV SOLN
INTRAVENOUS | Status: DC | PRN
  Administered 2018-04-06: 13:00:00 via INTRA_ARTERIAL

## 2018-04-06 MED ORDER — SODIUM CHLORIDE 0.9% FLUSH: 3.0000 mL | INTRAVENOUS | Status: DC | PRN

## 2018-04-06 MED ORDER — FENTANYL CITRATE (PF) 100 MCG/2ML IJ SOLN
INTRAMUSCULAR | Status: AC
  Filled 2018-04-06: qty 2

## 2018-04-06 MED ORDER — LIDOCAINE HCL (PF) 1 % IJ SOLN
INTRAMUSCULAR | Status: DC | PRN
  Administered 2018-04-06 (×2): 2 mL

## 2018-04-06 MED ORDER — SODIUM CHLORIDE 0.9 % IV SOLN: INTRAVENOUS | Status: AC

## 2018-04-06 SURGICAL SUPPLY — 11 items

## 2018-04-06 NOTE — Progress Notes (Signed)
Patient stable over night. Patient had minimal bleeding overnight. No complaints at this time. Patient in bed resting. Will continue to monitor.

## 2018-04-06 NOTE — Progress Notes (Signed)
Progress Note  Patient Name: Sherri DixonMattie M Mcmillan Date of Encounter: 04/06/2018  Primary Cardiologist: No primary care provider on file.   Subjective   No chest pain or dyspnea. Her mouth is sore.   Inpatient Medications    Scheduled Meds: . amLODipine  10 mg Oral Daily  . buPROPion  150 mg Oral Daily  . chlorhexidine  15 mL Mouth/Throat BID  . chlorthalidone  25 mg Oral Daily  . feeding supplement  1 Container Oral TID BM  . heparin  5,000 Units Subcutaneous Q8H  . pantoprazole  40 mg Oral BID  . sertraline  100 mg Oral QHS  . sodium chloride flush  3 mL Intravenous Q12H  . sucralfate  1 g Oral Daily   Continuous Infusions: . sodium chloride    . sodium chloride 1 mL/kg/hr (04/06/18 0519)  . lactated ringers Stopped (04/05/18 2255)  . sodium chloride irrigation     PRN Meds: sodium chloride, acetaminophen (TYLENOL) oral liquid 160 mg/5 mL **OR** acetaminophen, hydrALAZINE, Melatonin, nitroGLYCERIN, ondansetron (ZOFRAN) IV, oxyCODONE-acetaminophen, polyethylene glycol, polyvinyl alcohol, sodium chloride flush   Vital Signs    Vitals:   04/05/18 1819 04/05/18 2011 04/06/18 0049 04/06/18 0443  BP: 137/65 (!) 109/52 123/64 123/69  Pulse: 79 74 72 74  Resp: 18 18 18 18   Temp: 99.3 F (37.4 C) 99.6 F (37.6 C) 98.2 F (36.8 C) 99.1 F (37.3 C)  TempSrc:  Oral Oral Oral  SpO2: 100% 94% 100% 98%  Weight:    53.6 kg  Height:        Intake/Output Summary (Last 24 hours) at 04/06/2018 0849 Last data filed at 04/06/2018 0736 Gross per 24 hour  Intake 1304.05 ml  Output 1650 ml  Net -345.95 ml   Filed Weights   04/05/18 0722 04/05/18 1613 04/06/18 0443  Weight: 53.5 kg 54.4 kg 53.6 kg    Telemetry    sinus - Personally Reviewed  ECG    No AM EKG - Personally Reviewed  Physical Exam   GEN: No acute distress.   Neck: No JVD Cardiac: RRR, loud systolic murmur.   Respiratory: Clear to auscultation bilaterally. GI: Soft, nontender, non-distended    MS: No edema; No deformity. Neuro:  Nonfocal  Psych: Normal affect   Labs    Chemistry Recent Labs  Lab 04/04/18 0918 04/05/18 0659 04/06/18 0427  NA 143 142 136  K 5.3* 3.5 3.6  CL 110  --  101  CO2 23  --  26  GLUCOSE 103* 87 111*  BUN 16  --  12  CREATININE 1.00  --  1.14*  CALCIUM 9.2  --  9.2  GFRNONAA 52*  --  44*  GFRAA >60  --  51*  ANIONGAP 10  --  9     Hematology Recent Labs  Lab 04/04/18 0918 04/05/18 0659 04/06/18 0427  WBC 3.6*  --  7.2  RBC 4.38  --  4.63  HGB 10.7* 11.6* 10.5*  HCT 34.4* 34.0* 35.7*  MCV 78.5*  --  77.1*  MCH 24.4*  --  22.7*  MCHC 31.1  --  29.4*  RDW 24.8*  --  24.0*  PLT 137*  --  102*    Cardiac EnzymesNo results for input(s): TROPONINI in the last 168 hours. No results for input(s): TROPIPOC in the last 168 hours.   BNPNo results for input(s): BNP, PROBNP in the last 168 hours.   DDimer No results for input(s): DDIMER in the last 168 hours.  Radiology    No results found.  Cardiac Studies     Patient Profile     84 y.o. female with severe aortic stenosis, Barrett's esophagus, anxiety, depression, DVT, GERD, hiatal hernia, hyperlipidemia, HTN and iron deficiency anemia admitted post dental extraction. Plans for cardiac cath today in workup for TAVR  Assessment & Plan    1. Severe aortic stenosis: Right and left heart cath today. Cath has been delayed previously due to anemia. She has had iron infusion and blood transfusion. H/H stable today. Will proceed with cardiac cath today. She will have to discharged tomorrow since she has no family to stay with her tonight at home.   For questions or updates, please contact [facility] Please consult [email or web address] for contact info under        Signed, Verne Carrow, MD  04/06/2018, 8:49 AM

## 2018-04-06 NOTE — Interval H&P Note (Signed)
History and Physical Interval Note:  04/06/2018 12:08 PM  Sherri Mcmillan  has presented today for cardiac cath with the diagnosis of severe as  The various methods of treatment have been discussed with the patient and family. After consideration of risks, benefits and other options for treatment, the patient has consented to  Procedure(s): RIGHT/LEFT HEART CATH AND CORONARY ANGIOGRAPHY (N/A) as a surgical intervention .  The patient's history has been reviewed, patient examined, no change in status, stable for surgery.  I have reviewed the patient's chart and labs.  Questions were answered to the patient's satisfaction.    Cath Lab Visit (complete for each Cath Lab visit)  Clinical Evaluation Leading to the Procedure:   ACS: No.  Non-ACS:    Anginal Classification: CCS II  Anti-ischemic medical therapy: Minimal Therapy (1 class of medications)  Non-Invasive Test Results: No non-invasive testing performed  Prior CABG: No previous CABG        Verne Carrowhristopher 

## 2018-04-06 NOTE — Progress Notes (Signed)
POST OPERATIVE NOTE:  04/06/2018 Sherri Mcmillan [id number]  VITALS: BP 123/69 (BP Location: Right Arm)   Pulse 74   Temp 99.1 F (37.3 C) (Oral)   Resp 18   Ht 5' (1.524 m)   Wt 53.6 kg Comment: scale a  LMP 04/26/1950   SpO2 98%   BMI 23.08 kg/m   LABS:  Lab Results  Component Value Date   WBC 7.2 04/06/2018   HGB 10.5 (L) 04/06/2018   HCT 35.7 (L) 04/06/2018   MCV 77.1 (L) 04/06/2018   PLT 102 (L) 04/06/2018   BMET    Component Value Date/Time   NA 136 04/06/2018 0427   NA 141 03/01/2018 1659   K 3.6 04/06/2018 0427   CL 101 04/06/2018 0427   CO2 26 04/06/2018 0427   GLUCOSE 111 (H) 04/06/2018 0427   BUN 12 04/06/2018 0427   BUN 13 03/01/2018 1659   CREATININE 1.14 (H) 04/06/2018 0427   CREATININE 0.83 04/10/2014 1426   CALCIUM 9.2 04/06/2018 0427   GFRNONAA 44 (L) 04/06/2018 0427   GFRNONAA 67 04/10/2014 1426   GFRAA 51 (L) 04/06/2018 0427   GFRAA 78 04/10/2014 1426    Lab Results  Component Value Date   INR 1.03 09/20/2017   INR 1.09 06/24/2016   INR 1.10 06/24/2016   No results found for: PTT   Sherri Mcmillan is status post multiple extractions with alveoloplasty and gross debridement of remaining teeth in the operating with general anesthesia on 04/05/2018.  Patient was admitted for overnight observation as well as to proceed with cardiac catheterization as indicated.  SUBJECTIVE: Patient with minimal complaints from dental extraction sites. Patient denies having any active bleeding.  EXAM: There is no sign of infection, heme, or ooze.  Sutures are intact.  There is evidence of intraoral swelling and bruising.  ASSESSMENT: Post operative course is consistent with dental procedures performed in the operating room with general anesthesia. Loss of teeth due to extraction Chronic periodontitis Multiple missing teeth Malocclusion  PLAN: 1.  Will start Chlorhexidine rinses twice daily after breakfast and at bedtime 2.  Use salt water rinses  every 2 hours while awake in between the chlorhexidine rinses 3.  Advance diet as tolerated 4.  Patient to return dental medicine for evaluation of healing and suture removal as indicated.   5.  Follow-up with her primary dentist for evaluation for replacing the missing teeth with partial dentures after adequate healing and in coordination with anticipated heart valve surgery.   [name] , DDS

## 2018-04-06 NOTE — Progress Notes (Addendum)
Initial Nutrition Assessment  DOCUMENTATION CODES:   Not applicable  INTERVENTION:   -D/c Boost Breeze po TID, each supplement provides 250 kcal and 9 grams of protein -Ensure Enlive po BID, each supplement provides 350 kcal and 20 grams of protein -MVI with minerals daily -Downgrade diet to dysphagia 3 for ease of intake  NUTRITION DIAGNOSIS:   Predicted suboptimal nutrient intake related to (masticatory difficulty due to teeth extractions) as evidenced by meal completion < 50%.  GOAL:   Patient will meet greater than or equal to 90% of their needs  MONITOR:   PO intake, Supplement acceptance, Labs, Weight trends, Skin, I & O's  REASON FOR ASSESSMENT:   Malnutrition Screening Tool, Consult Assessment of nutrition requirement/status(multiple teeth extractions)  ASSESSMENT:   Sherri Mcmillan is a 84 y.o. female with Barrett's esophagus with reflux esophagitis, anxiety, depression, DVT, GERD, hiatal hernia, hyperlipidemia, HTN and severe aortic stenosis presented to tooth extractions who required overnight observation as no family to take care of her at home.   Pt admitted with severe aortic stenosis, undergoing TAVR work-up.   12/11- s/p Multiple extraction of tooth numbers 6 and 29; 2 Quadrants of alveoloplasty; Gross debridement of remaining dentition  Attempted to see pt x 2, however, either receiving nursing care or in cath lab at time of visit. Unable to obtain further nutrition history or perform nutrition-focused physical exam at this time.   Case discussed with RN, who reports pt has been NPO related to teeth extractions and upcoming cath this morning. Potential for diet advancement later on today.   Reviewed meal records; noted meal completion 25-45%.   Wt has been stable over the past 6 months.   Pt would benefit from addition of supplements.   Labs reviewed.   NUTRITION - FOCUSED PHYSICAL EXAM:    Most Recent Value  Orbital Region  Unable to assess   Upper Arm Region  Unable to assess  Thoracic and Lumbar Region  Unable to assess  Buccal Region  Unable to assess  Temple Region  Unable to assess  Clavicle Bone Region  Unable to assess  Clavicle and Acromion Bone Region  Unable to assess  Scapular Bone Region  Unable to assess  Dorsal Hand  Unable to assess  Patellar Region  Unable to assess  Anterior Thigh Region  Unable to assess  Posterior Calf Region  Unable to assess  Edema (RD Assessment)  Unable to assess  Hair  Unable to assess  Eyes  Unable to assess  Mouth  Unable to assess  Skin  Unable to assess  Nails  Unable to assess       Diet Order:   Diet Order            Diet Heart Room service appropriate? Yes; Fluid consistency: Thin  Diet effective now              EDUCATION NEEDS:   No education needs have been identified at this time  Skin:  Skin Assessment: Skin Integrity Issues: Skin Integrity Issues:: Incisions Incisions: closed lip  Last BM:  04/05/18  Height:   Ht Readings from Last 1 Encounters:  04/05/18 5' (1.524 Mcmillan)    Weight:   Wt Readings from Last 1 Encounters:  04/06/18 53.6 kg    Ideal Body Weight:  45.5 kg  BMI:  Body mass index is 23.08 kg/Mcmillan.  Estimated Nutritional Needs:   Kcal:  1400-1600  Protein:  65-80 grams  Fluid:  > 1.4 L   A. Jimmye Norman, RD, LDN, CDE Pager: [phone] After hours Pager: [phone]

## 2018-04-06 NOTE — Plan of Care (Signed)
  Problem: Education: Goal: Knowledge of General Education information will improve Description Including pain rating scale, medication(s)/side effects and non-pharmacologic comfort measures Outcome: Progressing   Problem: Health Behavior/Discharge Planning: Goal: Ability to manage health-related needs will improve Outcome: Progressing   Problem: Clinical Measurements: Goal: Ability to maintain clinical measurements within normal limits will improve Outcome: Progressing Goal: Will remain free from infection Outcome: Progressing Goal: Diagnostic test results will improve Outcome: Progressing Goal: Respiratory complications will improve Outcome: Progressing Goal: Cardiovascular complication will be avoided Outcome: Progressing   Problem: Nutrition: Goal: Adequate nutrition will be maintained Outcome: Progressing   Problem: Elimination: Goal: Will not experience complications related to bowel motility Outcome: Progressing Goal: Will not experience complications related to urinary retention Outcome: Progressing   Problem: Pain Managment: Goal: General experience of comfort will improve Outcome: Progressing   Problem: Safety: Goal: Ability to remain free from injury will improve Outcome: Progressing   Problem: Skin Integrity: Goal: Risk for impaired skin integrity will decrease Outcome: Progressing   Problem: Activity: Goal: Risk for activity intolerance will decrease Outcome: Not Met (add Reason)   Problem: Coping: Goal: Level of anxiety will decrease Outcome: Not Progressing

## 2018-04-06 NOTE — H&P (View-Only) (Signed)
 Progress Note  Patient Name: Sherri Mcmillan Date of Encounter: 04/06/2018  Primary Cardiologist: No primary care provider on file. McAlhany  Subjective   No chest pain or dyspnea. Her mouth is sore.   Inpatient Medications    Scheduled Meds: . amLODipine  10 mg Oral Daily  . buPROPion  150 mg Oral Daily  . chlorhexidine  15 mL Mouth/Throat BID  . chlorthalidone  25 mg Oral Daily  . feeding supplement  1 Container Oral TID BM  . heparin  5,000 Units Subcutaneous Q8H  . pantoprazole  40 mg Oral BID  . sertraline  100 mg Oral QHS  . sodium chloride flush  3 mL Intravenous Q12H  . sucralfate  1 g Oral Daily   Continuous Infusions: . sodium chloride    . sodium chloride 1 mL/kg/hr (04/06/18 0519)  . lactated ringers Stopped (04/05/18 2255)  . sodium chloride irrigation     PRN Meds: sodium chloride, acetaminophen (TYLENOL) oral liquid 160 mg/5 mL **OR** acetaminophen, hydrALAZINE, Melatonin, nitroGLYCERIN, ondansetron (ZOFRAN) IV, oxyCODONE-acetaminophen, polyethylene glycol, polyvinyl alcohol, sodium chloride flush   Vital Signs    Vitals:   04/05/18 1819 04/05/18 2011 04/06/18 0049 04/06/18 0443  BP: 137/65 (!) 109/52 123/64 123/69  Pulse: 79 74 72 74  Resp: 18 18 18 18  Temp: 99.3 F (37.4 C) 99.6 F (37.6 C) 98.2 F (36.8 C) 99.1 F (37.3 C)  TempSrc:  Oral Oral Oral  SpO2: 100% 94% 100% 98%  Weight:    53.6 kg  Height:        Intake/Output Summary (Last 24 hours) at 04/06/2018 0849 Last data filed at 04/06/2018 0736 Gross per 24 hour  Intake 1304.05 ml  Output 1650 ml  Net -345.95 ml   Filed Weights   04/05/18 0722 04/05/18 1613 04/06/18 0443  Weight: 53.5 kg 54.4 kg 53.6 kg    Telemetry    sinus - Personally Reviewed  ECG    No AM EKG - Personally Reviewed  Physical Exam   GEN: No acute distress.   Neck: No JVD Cardiac: RRR, loud systolic murmur.   Respiratory: Clear to auscultation bilaterally. GI: Soft, nontender, non-distended    MS: No edema; No deformity. Neuro:  Nonfocal  Psych: Normal affect   Labs    Chemistry Recent Labs  Lab 04/04/18 0918 04/05/18 0659 04/06/18 0427  NA 143 142 136  K 5.3* 3.5 3.6  CL 110  --  101  CO2 23  --  26  GLUCOSE 103* 87 111*  BUN 16  --  12  CREATININE 1.00  --  1.14*  CALCIUM 9.2  --  9.2  GFRNONAA 52*  --  44*  GFRAA >60  --  51*  ANIONGAP 10  --  9     Hematology Recent Labs  Lab 04/04/18 0918 04/05/18 0659 04/06/18 0427  WBC 3.6*  --  7.2  RBC 4.38  --  4.63  HGB 10.7* 11.6* 10.5*  HCT 34.4* 34.0* 35.7*  MCV 78.5*  --  77.1*  MCH 24.4*  --  22.7*  MCHC 31.1  --  29.4*  RDW 24.8*  --  24.0*  PLT 137*  --  102*    Cardiac EnzymesNo results for input(s): TROPONINI in the last 168 hours. No results for input(s): TROPIPOC in the last 168 hours.   BNPNo results for input(s): BNP, PROBNP in the last 168 hours.   DDimer No results for input(s): DDIMER in the last 168 hours.     Radiology    No results found.  Cardiac Studies     Patient Profile     84 y.o. female with severe aortic stenosis, Barrett's esophagus, anxiety, depression, DVT, GERD, hiatal hernia, hyperlipidemia, HTN and iron deficiency anemia admitted post dental extraction. Plans for cardiac cath today in workup for TAVR  Assessment & Plan    1. Severe aortic stenosis: Right and left heart cath today. Cath has been delayed previously due to anemia. She has had iron infusion and blood transfusion. H/H stable today. Will proceed with cardiac cath today. She will have to discharged tomorrow since she has no family to stay with her tonight at home.   For questions or updates, please contact [facility] Please consult [email or web address] for contact info under        Signed, Christopher McAlhany, MD  04/06/2018, 8:49 AM    

## 2018-04-07 DIAGNOSIS — E876 Hypokalemia: Secondary | ICD-10-CM

## 2018-04-07 DIAGNOSIS — I350 Nonrheumatic aortic (valve) stenosis: Secondary | ICD-10-CM | POA: Diagnosis not present

## 2018-04-07 DIAGNOSIS — Z01810 Encounter for preprocedural cardiovascular examination: Secondary | ICD-10-CM | POA: Diagnosis not present

## 2018-04-07 LAB — CBC
HCT: 35.4 % — ABNORMAL LOW (ref 36.0–46.0)
Hemoglobin: 10.5 g/dL — ABNORMAL LOW (ref 12.0–15.0)
MCH: 23.3 pg — AB (ref 26.0–34.0)
MCHC: 29.7 g/dL — ABNORMAL LOW (ref 30.0–36.0)
MCV: 78.5 fL — ABNORMAL LOW (ref 80.0–100.0)
Platelets: 107 10*3/uL — ABNORMAL LOW (ref 150–400)
RBC: 4.51 MIL/uL (ref 3.87–5.11)
RDW: 24.4 % — ABNORMAL HIGH (ref 11.5–15.5)
WBC: 4.4 10*3/uL (ref 4.0–10.5)
nRBC: 0 % (ref 0.0–0.2)

## 2018-04-07 LAB — BASIC METABOLIC PANEL
Anion gap: 12 (ref 5–15)
BUN: 9 mg/dL (ref 8–23)
CO2: 26 mmol/L (ref 22–32)
Calcium: 9 mg/dL (ref 8.9–10.3)
Chloride: 101 mmol/L (ref 98–111)
Creatinine, Ser: 0.95 mg/dL (ref 0.44–1.00)
GFR calc Af Amer: >60 mL/min (ref >60)
GFR, EST NON AFRICAN AMERICAN: 55 mL/min — AB (ref >60)
GLUCOSE: 94 mg/dL (ref 70–99)
Potassium: 3.1 mmol/L — ABNORMAL LOW (ref 3.5–5.1)
Sodium: 139 mmol/L (ref 135–145)

## 2018-04-07 MED ORDER — POTASSIUM CHLORIDE CRYS ER 20 MEQ PO TBCR
40.0000 meq | EXTENDED_RELEASE_TABLET | Freq: Once | ORAL | Status: AC
  Administered 2018-04-07: 40 meq via ORAL
  Filled 2018-04-07: qty 2

## 2018-04-07 NOTE — Progress Notes (Signed)
Teli and IV's d/c

## 2018-04-07 NOTE — Discharge Summary (Signed)
Discharge Summary    Patient ID: Sherri Mcmillan,  MRN: [id number], DOB/AGE: 82-Mar-1934 82 y.o.  Admit date: 04/05/2018 Discharge date: 04/07/2018  Primary Care Provider: Levora Dredge Primary Cardiologist: Verne Carrow, MD   Discharge Diagnoses    Principal Problem:   Severe aortic stenosis Active Problems:   Atypical chest pain   Hypokalemia   Allergies Allergies  Allergen Reactions  . Aspirin Rash    Diagnostic Studies/Procedures    CARDIAC CATH: 04/06/2018  LV end diastolic pressure is normal.  Severe aortic stenosis (peak to peak gradient 43 mmHg, mean gradient 37.7 mmHg, AVA 1.17 cm2)  No angiographic evidence of CAD   Will proceed with planning for TAVR. Pt will remain in the hospital tonight post cath. She has no family or friends who are able to stay with her at home tonight.  _____________   History of Present Illness     84 y.o. female with severe aortic stenosis, Barrett's esophagus, anxiety, depression, DVT, GERD, hiatal hernia, hyperlipidemia, HTN and iron deficiency anemia admitted 12/12 post dental extraction for left and right cardiac catheterization and work-up for TAVR.  Hospital Course     Consultants: None  Cardiac catheterization results are above.  She was shown to have severe aortic stenosis, but no CAD.  It was not safe for her to go home alone, so she was hydrated and held overnight.  On 12/13, she was seen by Dr. Clifton James.  She was having some chest pain but the pain was at rest and had been present for months.  Dr. Clifton James suspected it was related to her GERD and noted that she was on a PPI.  She is to continue this.  She was noted to be hypokalemic with a potassium of 3.1.  This was felt secondary to hydration before and after the heart catheterization.  Her potassium was supplemented and will be followed as an outpatient.  Dr. Clifton James contacted the valve clinic to arrange her CT scans and other pre-TAVR testing.  She  was stable post cath.  No further inpatient work-up was indicated and she is considered stable for discharge, to follow-up as an outpatient.  _____________  Discharge Vitals Blood pressure 109/67, pulse 67, temperature 98.8 F (37.1 C), temperature source Oral, resp. rate 18, height 5' (1.524 m), weight 52.3 kg, last menstrual period 04/26/1950, SpO2 98 %.  Filed Weights   04/05/18 1613 04/06/18 0443 04/07/18 0604  Weight: 54.4 kg 53.6 kg 52.3 kg    Labs & Radiologic Studies    CBC Recent Labs    04/06/18 0427 04/07/18 0505  WBC 7.2 4.4  HGB 10.5* 10.5*  HCT 35.7* 35.4*  MCV 77.1* 78.5*  PLT 102* 107*   Basic Metabolic Panel Recent Labs    40/98/11 0427 04/07/18 0505  NA 136 139  K 3.6 3.1*  CL 101 101  CO2 26 26  GLUCOSE 111* 94  BUN 12 9  CREATININE 1.14* 0.95  CALCIUM 9.2 9.0  _____________   Disposition   Pt is being discharged home today in good condition.  Follow-up Plans & Appointments    Follow-up Information    Charlynne Pander, DDS. Call on 04/12/2018.   Specialty:  Dentistry Why:  Call for appointment with dental medicine in 7 to 10 days for evaluation of healing and suture removal Contact information: [address] [place] [address] [phone]          Discharge Instructions    Diet - low sodium  heart healthy   Complete by:  As directed    Increase activity slowly   Complete by:  As directed       Discharge Medications   Allergies as of 04/07/2018      Reactions   Aspirin Rash      Medication List    STOP taking these medications   oxyCODONE 5 MG immediate release tablet Commonly known as:  ROXICODONE     TAKE these medications   amLODipine 10 MG tablet Commonly known as:  NORVASC TAKE 1 TABLET BY MOUTH EVERY DAY   buPROPion 150 MG 24 hr tablet Commonly known as:  WELLBUTRIN XL Take 1 tablet (150 mg total) by mouth daily.   chlorthalidone 25 MG tablet Commonly known as:  HYGROTON Take 1 tablet (25 mg  total) by mouth daily.   [facility] MELATONIN 10 MG Caps Generic drug:  Melatonin TAKE 10 MG BY MOUTH AT BEDTIME. What changed:  See the new instructions.   dexlansoprazole 60 MG capsule Commonly known as:  DEXILANT Take 1 capsule (60 mg total) by mouth 2 (two) times daily. What changed:  when to take this   hydroxypropyl methylcellulose / hypromellose 2.5 % ophthalmic solution Commonly known as:  ISOPTO TEARS / GONIOVISC Place 1 drop into both eyes daily as needed for dry eyes.   PROAIR HFA 108 (90 Base) MCG/ACT inhaler Generic drug:  albuterol INHALE 2 PUFFS INTO THE LUNGS EVERY 4 (FOUR) HOURS AS NEEDED FOR WHEEZING OR SHORTNESS OF BREATH. What changed:  See the new instructions.   ranitidine 300 MG tablet Commonly known as:  ZANTAC Take 1 tablet (300 mg total) by mouth at bedtime.   sertraline 50 MG tablet Commonly known as:  ZOLOFT Take 100 mg by mouth at bedtime.   sucralfate 1 GM/10ML suspension Commonly known as:  CARAFATE TAKE 10 MLS (1 G TOTAL) BY MOUTH 2 (TWO) TIMES DAILY. What changed:    how much to take  how to take this  when to take this         Outstanding Labs/Studies   None  Duration of Discharge Encounter   Greater than 30 minutes including physician time.  Bobbye RiggsSigned, Rhonda Barrett NP 04/07/2018, 2:43 PM

## 2018-04-07 NOTE — Progress Notes (Signed)
Per patient her legal guardian is a friend Melida Gimenezlexander [name], he is aware that patient is being d/c today and he is coming to pick her up

## 2018-04-07 NOTE — Progress Notes (Addendum)
Progress Note  Patient Name: Sherri Mcmillan Date of Encounter: 04/07/2018  Primary Cardiologist: Verne Carrow, MD   Subjective   She has the same chronic epigastric/chest pain she has had for months. No dyspnea.   Inpatient Medications    Scheduled Meds: . amLODipine  10 mg Oral Daily  . buPROPion  150 mg Oral Daily  . chlorhexidine  15 mL Mouth/Throat BID  . chlorthalidone  25 mg Oral Daily  . feeding supplement (ENSURE ENLIVE)  237 mL Oral BID BM  . multivitamin with minerals  1 tablet Oral Daily  . pantoprazole  40 mg Oral BID  . sertraline  100 mg Oral QHS  . sodium chloride flush  3 mL Intravenous Q12H  . sodium chloride irrigation  200 mL Irrigation Q2H while awake  . sucralfate  1 g Oral Daily   Continuous Infusions: . sodium chloride    . lactated ringers Stopped (04/07/18 0509)   PRN Meds: sodium chloride, acetaminophen (TYLENOL) oral liquid 160 mg/5 mL **OR** acetaminophen, hydrALAZINE, Melatonin, nitroGLYCERIN, ondansetron (ZOFRAN) IV, oxyCODONE-acetaminophen, polyethylene glycol, polyvinyl alcohol, sodium chloride flush   Vital Signs    Vitals:   04/06/18 2118 04/07/18 0604 04/07/18 0609 04/07/18 0936  BP: 131/65   122/74  Pulse: 70   72  Resp: 18   18  Temp: 98.6 F (37 C)  99.5 F (37.5 C)   TempSrc: Oral  Oral   SpO2: 100%   98%  Weight:  52.3 kg    Height:        Intake/Output Summary (Last 24 hours) at 04/07/2018 0953 Last data filed at 04/07/2018 1478 Gross per 24 hour  Intake 240 ml  Output 750 ml  Net -510 ml   Filed Weights   04/05/18 1613 04/06/18 0443 04/07/18 0604  Weight: 54.4 kg 53.6 kg 52.3 kg    Telemetry    Sinus - Personally Reviewed  ECG    No AM EKG - Personally Reviewed  Physical Exam   General: Well developed, well nourished, NAD  HEENT: OP clear, mucus membranes moist  SKIN: warm, dry. No rashes. Neuro: No focal deficits  Musculoskeletal: Muscle strength 5/5 all ext  Psychiatric: Mood and  affect normal  Neck: No JVD, no carotid bruits, no thyromegaly, no lymphadenopathy.  Lungs:Clear bilaterally, no wheezes, rhonci, crackles Cardiovascular: Regular rate and rhythm. Loud, harsh systolic murmur.  Abdomen:Soft. Bowel sounds present. Non-tender.  Extremities: No lower extremity edema. Pulses are 2 + in the bilateral DP/PT.    Labs    Chemistry Recent Labs  Lab 04/04/18 [phone] 04/05/18 0659 04/06/18 0427 04/07/18 0505  NA 143 142 136 139  K 5.3* 3.5 3.6 3.1*  CL 110  --  101 101  CO2 23  --  26 26  GLUCOSE 103* 87 111* 94  BUN 16  --  12 9  CREATININE 1.00  --  1.14* 0.95  CALCIUM 9.2  --  9.2 9.0  GFRNONAA 52*  --  44* 55*  GFRAA >60  --  51* >60  ANIONGAP 10  --  9 12     Hematology Recent Labs  Lab 04/04/18 0918 04/05/18 0659 04/06/18 0427 04/07/18 0505  WBC 3.6*  --  7.2 4.4  RBC 4.38  --  4.63 4.51  HGB 10.7* 11.6* 10.5* 10.5*  HCT 34.4* 34.0* 35.7* 35.4*  MCV 78.5*  --  77.1* 78.5*  MCH 24.4*  --  22.7* 23.3*  MCHC 31.1  --  29.4* 29.7*  RDW 24.8*  --  24.0* 24.4*  PLT 137*  --  102* 107*    Cardiac EnzymesNo results for input(s): TROPONINI in the last 168 hours. No results for input(s): TROPIPOC in the last 168 hours.   BNPNo results for input(s): BNP, PROBNP in the last 168 hours.   DDimer No results for input(s): DDIMER in the last 168 hours.   Radiology    No results found.  Cardiac Studies     Patient Profile     84 y.o. female with severe aortic stenosis, Barrett's esophagus, anxiety, depression, DVT, GERD, hiatal hernia, hyperlipidemia, HTN and iron deficiency anemia admitted post dental extraction. Plans for cardiac cath today in workup for TAVR  Assessment & Plan    1. Severe aortic stenosis: She is s/p right and left heart cath 04/06/18. No evidence of CAD. She has severe AS. Will continue workup for TAVR as an outpatient. She was kept overnight post cath due to lack of family or friends to stay with her at home last night.  H/H stable post cath.   2. Hypokalemia: Replace potassium this am   3. Chest pain: Her pain is not felt to be due to her valve disease. The pain is at rest. The pain has been present for months. I suspect it is related to her GERD. Continue PPI.   Discharge home today. We will be in contact to arrange her CT scans and other pre-TAVR testing.   For questions or updates, please contact [facility] Please consult [email or web address] for contact info under        Signed, Verne Carrowhristopher , MD  04/07/2018, 9:53 AM

## 2018-04-07 NOTE — Progress Notes (Signed)
Patient ready to be d/c, awaiting on a ride.

## 2018-04-07 NOTE — Progress Notes (Signed)
Nutrition Follow-up  DOCUMENTATION CODES:   Not applicable  INTERVENTION:   -Continue MVI with minerals daily -Continue Ensure Enlive po BID, each supplement provides 350 kcal and 20 grams of protein  NUTRITION DIAGNOSIS:   Predicted suboptimal nutrient intake related to (masticatory difficulty due to teeth extractions) as evidenced by meal completion < 50%.  Ongoing  GOAL:   Patient will meet greater than or equal to 90% of their needs  Progressing  MONITOR:   PO intake, Supplement acceptance, Labs, Weight trends, Skin, I & O's  REASON FOR ASSESSMENT:   Malnutrition Screening Tool, Consult Assessment of nutrition requirement/status(multiple teeth extractions)  ASSESSMENT:   Sherri Mcmillan is a 84 y.o. female with Barrett's esophagus with reflux esophagitis, anxiety, depression, DVT, GERD, hiatal hernia, hyperlipidemia, HTN and severe aortic stenosis presented to tooth extractions who required overnight observation as no family to take care of her at home.   12/11- s/p Multiple extraction of tooth numbers6 and 29; 2Quadrants of alveoloplasty; Gross debridement of remaining dentition 12/12- s/p rt/lt heart cath  Case discussed with RN, who reports likely discharge home later today.  Spoke with pt at bedside, who reports fair appetite; meal completion 25-45%. She denies any difficulty chewing despite teeth extractions. Pt was consuming graham crackers and peanut butter without difficulty. Pt was very tangential at time of visit and it was very difficult to obtain nutrition history- pt was fixated on talking about how her dinner of a grilled cheese sandwich was delivered late and too hard for her to eat last night.   Pt reports wt loss, but unable to reports time frame or quantity. Per wt hx, wt has been stable over the past 6 months.  Discussed importance of good meal and supplement intake to promote healing.  Labs reviewed: K: 3.1.   NUTRITION - FOCUSED PHYSICAL  EXAM:    Most Recent Value  Orbital Region  No depletion  Upper Arm Region  Mild depletion  Thoracic and Lumbar Region  No depletion  Buccal Region  No depletion  Temple Region  No depletion  Clavicle Bone Region  No depletion  Clavicle and Acromion Bone Region  No depletion  Scapular Bone Region  No depletion  Dorsal Hand  Mild depletion  Patellar Region  No depletion  Anterior Thigh Region  No depletion  Posterior Calf Region  No depletion  Edema (RD Assessment)  Mild  Hair  Reviewed  Eyes  Reviewed  Mouth  Reviewed  Skin  Reviewed  Nails  Reviewed       Diet Order:   Diet Order            Diet - low sodium heart healthy        Diet 2 gram sodium Room service appropriate? Yes; Fluid consistency: Thin  Diet effective now              EDUCATION NEEDS:   No education needs have been identified at this time  Skin:  Skin Assessment: Skin Integrity Issues: Skin Integrity Issues:: Incisions Incisions: closed lip  Last BM:  04/05/18  Height:   Ht Readings from Last 1 Encounters:  04/05/18 5' (1.524 m)    Weight:   Wt Readings from Last 1 Encounters:  04/07/18 52.3 kg    Ideal Body Weight:  45.5 kg  BMI:  Body mass index is 22.5 kg/m.  Estimated Nutritional Needs:   Kcal:  1400-1600  Protein:  65-80 grams  Fluid:  > 1.4 L   A. Jimmye Norman, RD, LDN, CDE Pager: [phone] After hours Pager: [phone]

## 2018-04-07 NOTE — Progress Notes (Signed)
Patient discharged: Home with family  Via: Wheelchair   Discharge paperwork given: to patient and family  Reviewed with teach back  IV and telemetry disconnected  Belongings given to patient    

## 2018-04-10 ENCOUNTER — Other Ambulatory Visit: Payer: Self-pay | Admitting: Physician Assistant

## 2018-04-10 ENCOUNTER — Encounter: Payer: Self-pay | Admitting: Physician Assistant

## 2018-04-10 DIAGNOSIS — I350 Nonrheumatic aortic (valve) stenosis: Secondary | ICD-10-CM

## 2018-04-23 ENCOUNTER — Other Ambulatory Visit: Payer: Self-pay | Admitting: Gastroenterology

## 2018-04-23 DIAGNOSIS — K2100 Gastro-esophageal reflux disease with esophagitis, without bleeding: Secondary | ICD-10-CM

## 2018-04-23 DIAGNOSIS — K449 Diaphragmatic hernia without obstruction or gangrene: Secondary | ICD-10-CM

## 2018-05-01 ENCOUNTER — Ambulatory Visit (HOSPITAL_COMMUNITY)
Admission: RE | Admit: 2018-05-01 | Discharge: 2018-05-01 | Disposition: A | Discharge status: Home / Self Care | Payer: Medicare Other | Source: Ambulatory Visit | Attending: Cardiovascular Disease | Admitting: Cardiovascular Disease

## 2018-05-01 ENCOUNTER — Ambulatory Visit (HOSPITAL_COMMUNITY): Payer: Medicare Other

## 2018-05-01 ENCOUNTER — Ambulatory Visit (HOSPITAL_BASED_OUTPATIENT_CLINIC_OR_DEPARTMENT_OTHER)
Admission: RE | Admit: 2018-05-01 | Discharge: 2018-05-01 | Disposition: A | Discharge status: Home / Self Care | Payer: Medicare Other | Source: Ambulatory Visit | Attending: Cardiovascular Disease | Admitting: Cardiovascular Disease

## 2018-05-01 ENCOUNTER — Encounter (HOSPITAL_COMMUNITY): Payer: Self-pay

## 2018-05-01 ENCOUNTER — Telehealth (HOSPITAL_COMMUNITY): Payer: Self-pay | Admitting: Dentistry

## 2018-05-01 DIAGNOSIS — K449 Diaphragmatic hernia without obstruction or gangrene: Secondary | ICD-10-CM | POA: Diagnosis not present

## 2018-05-01 DIAGNOSIS — I350 Nonrheumatic aortic (valve) stenosis: Secondary | ICD-10-CM

## 2018-05-01 DIAGNOSIS — Z01818 Encounter for other preprocedural examination: Secondary | ICD-10-CM | POA: Diagnosis not present

## 2018-05-01 DIAGNOSIS — I6523 Occlusion and stenosis of bilateral carotid arteries: Secondary | ICD-10-CM | POA: Diagnosis not present

## 2018-05-01 DIAGNOSIS — I700 Atherosclerosis of aorta: Secondary | ICD-10-CM | POA: Diagnosis not present

## 2018-05-01 DIAGNOSIS — K5730 Diverticulosis of large intestine without perforation or abscess without bleeding: Secondary | ICD-10-CM | POA: Diagnosis not present

## 2018-05-01 DIAGNOSIS — J988 Other specified respiratory disorders: Secondary | ICD-10-CM | POA: Diagnosis not present

## 2018-05-01 DIAGNOSIS — K769 Liver disease, unspecified: Secondary | ICD-10-CM | POA: Diagnosis not present

## 2018-05-01 LAB — PULMONARY FUNCTION TEST
DL/VA % pred: 101 %
DL/VA: 4.28 ml/min/mmHg/L
DLCO unc % pred: 66 %
DLCO unc: 12.5 ml/min/mmHg
FEF 25-75 Pre: 0.79 L/sec
FEF2575-%Pred-Pre: 85 %
FEV1-%Pred-Pre: 121 %
FEV1-Pre: 1.38 L
FEV1FVC-%Pred-Pre: 92 %
FEV6-%Pred-Pre: 142 %
FEV6-Pre: 1.99 L
FEV6FVC-%Pred-Pre: 105 %
FVC-%Pred-Pre: 134 %
FVC-PRE: 1.99 L
PRE FEV6/FVC RATIO: 100 %
Pre FEV1/FVC ratio: 69 %
RV % pred: 106 %
RV: 2.39 L
TLC % pred: 95 %
TLC: 4.27 L

## 2018-05-01 MED ORDER — IOPAMIDOL (ISOVUE-370) INJECTION 76%
100.0000 mL | Freq: Once | INTRAVENOUS | Status: AC | PRN
  Administered 2018-05-01: 100 mL via INTRAVENOUS

## 2018-05-01 NOTE — Telephone Encounter (Signed)
05/01/2018  Patient:            Sherri Mcmillan Date of Birth:  05/05/1932 MRN:                [id number]   Multiple attempts were made to schedule the patient for a postop evaluation of healing after dental extractions. I was unable leave a message on her mobile phone since her voicemail was not set up. I did leave a message with her friend, that assists with her transportation, to have patient call and schedule postop evaluation. Dr. Kristin Bruins

## 2018-05-01 NOTE — Progress Notes (Signed)
  Vascular Ultrasound Carotid Duplex (Doppler) has been completed.   Refer to "CV Proc" under chart review to view preliminary results.  05/01/2018 9:14 AM Gertie FeyMichelle , MHA, RVT, RDCS, RDMS

## 2018-05-04 ENCOUNTER — Ambulatory Visit: Payer: Medicare Other | Admitting: Physical Therapy

## 2018-05-04 ENCOUNTER — Encounter: Payer: Self-pay | Admitting: Surgery

## 2018-05-11 ENCOUNTER — Encounter: Payer: Self-pay | Admitting: Surgery

## 2018-05-11 ENCOUNTER — Encounter (HOSPITAL_COMMUNITY): Payer: Self-pay | Admitting: Dentistry

## 2018-05-11 ENCOUNTER — Other Ambulatory Visit: Payer: Self-pay

## 2018-05-11 ENCOUNTER — Ambulatory Visit: Payer: Medicare Other | Attending: Physician Assistant | Admitting: Physical Therapy

## 2018-05-11 ENCOUNTER — Ambulatory Visit (HOSPITAL_COMMUNITY): Payer: Self-pay | Admitting: Dentistry

## 2018-05-11 ENCOUNTER — Encounter: Payer: Self-pay | Admitting: Physical Therapy

## 2018-05-11 ENCOUNTER — Institutional Professional Consult (permissible substitution) (INDEPENDENT_AMBULATORY_CARE_PROVIDER_SITE_OTHER): Payer: Medicare Other | Admitting: Surgery

## 2018-05-11 VITALS — BP 164/112 | HR 71 | Temp 98.0°F

## 2018-05-11 VITALS — BP 175/92 | HR 60 | Resp 20 | Ht 60.0 in | Wt 123.0 lb

## 2018-05-11 DIAGNOSIS — R262 Difficulty in walking, not elsewhere classified: Secondary | ICD-10-CM | POA: Diagnosis not present

## 2018-05-11 DIAGNOSIS — K08199 Complete loss of teeth due to other specified cause, unspecified class: Secondary | ICD-10-CM

## 2018-05-11 DIAGNOSIS — I350 Nonrheumatic aortic (valve) stenosis: Secondary | ICD-10-CM

## 2018-05-11 DIAGNOSIS — Z01818 Encounter for other preprocedural examination: Secondary | ICD-10-CM

## 2018-05-11 DIAGNOSIS — K08409 Partial loss of teeth, unspecified cause, unspecified class: Secondary | ICD-10-CM

## 2018-05-11 DIAGNOSIS — M264 Malocclusion, unspecified: Secondary | ICD-10-CM

## 2018-05-11 NOTE — Progress Notes (Signed)
Patient ID: AKAYA PROFFIT, female   DOB: 1933/03/22, 85 y.o.   MRN: [id number]  [facility]  MULTIDISCIPLINARY HEART VALVE CLINIC  CARDIOTHORACIC SURGERY CONSULTATION REPORT  Referring Provider is Kathleene Hazel* PCP is Levora Dredge, MD  Chief Complaint  Patient presents with  . Aortic Stenosis    Surgical eval for TAVR, review all studies    HPI:  The patient is an 85 year old woman with a history of hypertension, hyperlipidemia, hiatal hernia with GERD/reflux esophagitis/Barrett's esophagus, and aortic stenosis who underwent a laparoscopic Nissen fundoplication in the past by Dr. Wenda Low.  She apparently had a failure of the fundoplication and has had recurrent severe reflux causing esophagitis with burning pain in her chest, regurgitation, wheezing and shortness of breath particularly when lying down.  She was being evaluated for further surgical treatment of her hiatal hernia and had a preoperative cardiac evaluation.  A repeat echocardiogram on 02/24/2018 showed a left ventricular ejection fraction of 60 to 65% with grade 2 diastolic dysfunction.  The aortic valve was thickened and calcified with restricted mobility and a mean gradient of 40 mmHg, aortic valve area of 0.9 cm, and dimensionless index of 0.24 consistent with severe aortic stenosis.  She was seen by Dr. Excell Seltzer for consideration of TAVR and underwent cardiac catheterization on 04/06/2018 which showed no coronary artery disease.  The mean gradient across aortic valve was 38 with a peak to peak gradient of 43 mmHg.  The patient reports 3 episodes of dizziness and loss of balance since November.  She has exertional fatigue and tiredness as well as shortness of breath.  She has had occasional swelling in her feet.  She does report shortness of breath and wheezing with laying down with sounds legs more related to reflux.  She lives at home alone and takes care of her house.  She is very active for her  age.  She was evaluated by Dr. Kristin Bruins as part of her pre-TAVR work-up.  She underwent extraction of 2 teeth as well as alveoloplasty and gross debridement of her remaining dentition.  She has recovered well from that.  Past Medical History:  Diagnosis Date  . Anemia 12/14/2012  . Ataxia 06/24/2016  . Auditory hallucinations 08/19/2010   Has has auditory hallucinations, which seem to have worsened since death of her sister 2010/01/22. Admission to [facility] (90s or early 2000)  for 6 weeks after mother died, also with hallucinations.  B/C of severe mental illness and refusal to see pysch, I have refused to refill controlled meds. If she decides to pursue mental health, then she needs to take the initiative and sch the appt bc [name] has s  . Barrett's esophagus    Demonstrated on EGD 01/23/11. EGD 09/17/2012 shows inflamed GE junctional mucosa without metaplasia, dysplasia, or malignancy.  . Cataract   . Chronic anxiety    Admission to [facility] (90s or early 2000)  for 6 weeks after mother died. Complicated again by death of her sister 2010. 2012 developed hallucinations and I refused to refill controlled meds unless she see psych which she is not agreable to  . Chronic insomnia   . Depression   . DVT (deep venous thrombosis) (HCC)   . Gastroparesis    Demonstrated on GES 2011-01-23 by Dr Dalene Seltzer  . GERD (gastroesophageal reflux disease)   . H/O hiatal hernia   . HLD (hyperlipidemia) 01/2010  . HTN (hypertension)    Controlled with 2 drug therapy  . Left  leg DVT (HCC) 09/28/2012  . Migraine   . Muscle spasm 09/29/2017  . Severe aortic stenosis     Past Surgical History:  Procedure Laterality Date  . 24 HOUR PH STUDY N/A 08/24/2017   Procedure: 24 HOUR PH STUDY IMPEDANCE;  Surgeon: Sherrilyn Rist, MD;  Location: [facility] ENDOSCOPY;  Service: Gastroenterology;  Laterality: N/A;  . ABDOMINAL HYSTERECTOMY    . APPENDECTOMY    . CATARACT EXTRACTION     left eye  . COLONOSCOPY  2000&2005   Dr.Magod  . DIRECT  LARYNGOSCOPY   September 2008    preoperative diagnosis hoarseness with anterior right vocal cord lesion -  direct laryngoscopy and excisional biopsy of right anterior vocal cord lesion done by Dr. Ezzard Standing  . ENDOVENOUS ABLATION SAPHENOUS VEIN W/ LASER Left 05-09-2014   EVLA left small saphenous vein by Gretta Began MD  . ESOPHAGEAL MANOMETRY N/A 08/24/2017   Procedure: ESOPHAGEAL MANOMETRY (EM);  Surgeon: Sherrilyn Rist, MD;  Location: [facility] ENDOSCOPY;  Service: Gastroenterology;  Laterality: N/A;  . ESOPHAGOGASTRODUODENOSCOPY  11/2010  . ESOPHAGOGASTRODUODENOSCOPY N/A 09/17/2012   Procedure: ESOPHAGOGASTRODUODENOSCOPY (EGD);  Surgeon: Hart Carwin, MD;  Location: [facility] ENDOSCOPY;  Service: Endoscopy;  Laterality: N/A;  . ESOPHAGOGASTRODUODENOSCOPY N/A 12/17/2012   Procedure: ESOPHAGOGASTRODUODENOSCOPY (EGD);  Surgeon: Beverley Fiedler, MD;  Location: [facility] ENDOSCOPY;  Service: Gastroenterology;  Laterality: N/A;  . EXCISION MASS HEAD N/A 03/05/2016   Procedure: EXCISION POSTERIOR SCALP MASS;  Surgeon: Axel Filler, MD;  Location: [facility] OR;  Service: General;  Laterality: N/A;  . EYE SURGERY    . HEMORRHOID SURGERY    . KNEE ARTHROSCOPY    . LAPAROSCOPIC NISSEN FUNDOPLICATION N/A 05/02/2013   Procedure: LAPAROSCOPIC NISSEN FUNDOPLICATION;  Surgeon: Valarie Merino, MD;  Location: [facility] ORS;  Service: General;  Laterality: N/A;  . MENISCECTOMY   July 2002    preoperative diagnosis torn medial meniscus right knee, partial medial meniscectomy, debridement chondroplasty patellofemoral joint, done by Dr. Madelon Lips  . POLYPECTOMY  2000   Dr.Magod  . RIGHT/LEFT HEART CATH AND CORONARY ANGIOGRAPHY N/A 04/06/2018   Procedure: RIGHT/LEFT HEART CATH AND CORONARY ANGIOGRAPHY;  Surgeon: Kathleene Hazel, MD;  Location: [facility] INVASIVE CV LAB;  Service: Cardiovascular;  Laterality: N/A;  . ROTATOR CUFF REPAIR  09/21/2011   rt shoulder  . TOOTH EXTRACTION Bilateral 04/05/2018   Procedure: Extraction of tooth #'s 6 and 29  with alveoloplasty and gross debridement of remaining teeth;  Surgeon: Charlynne Pander, DDS;  Location: [facility] OR;  Service: Oral Surgery;  Laterality: Bilateral;    Family History  Problem Relation Age of Onset  . Heart disease Mother   . Hypertension Mother   . Heart attack Mother   . Hypertension Father   . Heart attack Father   . Diabetes Brother   . Stroke Neg Hx   . Cancer Neg Hx   . Colon cancer Neg Hx   . Anesthesia problems Neg Hx   . Hypotension Neg Hx   . Malignant hyperthermia Neg Hx   . Pseudochol deficiency Neg Hx   . Stomach cancer Neg Hx   . Rectal cancer Neg Hx   . Esophageal cancer Neg Hx     Social History   Socioeconomic History  . Marital status: Widowed    Spouse name: Not on file  . Number of children: 1  . Years of education: some college  . Highest education level: Not on file  Occupational History  . Occupation: Retired-worked in  motels  Social Needs  . Financial resource strain: Hard  . Food insecurity:    Worry: Often true    Inability: Often true  . Transportation needs:    Medical: Yes    Non-medical: No  Tobacco Use  . Smoking status: Former Smoker    Years: 50.00    Types: Cigarettes    Last attempt to quit: 06/02/1983    Years since quitting: 34.9  . Smokeless tobacco: Never Used  Substance and Sexual Activity  . Alcohol use: No    Alcohol/week: 0.0 standard drinks    Comment: quit 24 years ago  . Drug use: No  . Sexual activity: Not Currently    Birth control/protection: Post-menopausal, None  Lifestyle  . Physical activity:    Days per week: Not on file    Minutes per session: Not on file  . Stress: Not on file  Relationships  . Social connections:    Talks on phone: More than three times a week    Gets together: More than three times a week    Attends religious service: More than 4 times per year    Active member of club or organization: Yes    Attends meetings of clubs or organizations: More than 4 times per year     Relationship status: Widowed  . Intimate partner violence:    Fear of current or ex partner: Not on file    Emotionally abused: Not on file    Physically abused: Not on file    Forced sexual activity: Not on file  Other Topics Concern  . Not on file  Social History Narrative   Volunteers at middle school to be a grandmother to the other children in need   Volunteers at a radio station and is close with her church family   Sings with church and has several CDs   Her sister died on 2010/01/11 and she is in the grieving process and trying to comfort her sisters kids   Patient quit smoking in 1985. The patient nolonger drinks alcohol.   The patient is widowed.   Patient has an adopted daughter living in [place].   Education: some college.     Current Outpatient Medications  Medication Sig Dispense Refill  . amLODipine (NORVASC) 10 MG tablet TAKE 1 TABLET BY MOUTH EVERY DAY (Patient taking differently: Take 10 mg by mouth daily. ) 90 tablet 2  . buPROPion (WELLBUTRIN XL) 150 MG 24 hr tablet Take 1 tablet (150 mg total) by mouth daily. 90 tablet 0  . chlorthalidone (HYGROTON) 25 MG tablet Take 1 tablet (25 mg total) by mouth daily. 90 tablet 0  . CVS MELATONIN 10 MG CAPS TAKE 10 MG BY MOUTH AT BEDTIME. (Patient taking differently: Take 10 mg by mouth at bedtime as needed (sleep). ) 90 capsule 1  . dexlansoprazole (DEXILANT) 60 MG capsule Take 1 capsule (60 mg total) by mouth 2 (two) times daily. (Patient taking differently: Take 60 mg by mouth daily. ) 180 capsule   . hydroxypropyl methylcellulose / hypromellose (ISOPTO TEARS / GONIOVISC) 2.5 % ophthalmic solution Place 1 drop into both eyes daily as needed for dry eyes.    Marland Kitchen PROAIR HFA 108 (90 Base) MCG/ACT inhaler INHALE 2 PUFFS INTO THE LUNGS EVERY 4 (FOUR) HOURS AS NEEDED FOR WHEEZING OR SHORTNESS OF BREATH. (Patient taking differently: Inhale 2 puffs into the lungs every 4 (four) hours as needed for wheezing or shortness of breath. ) 8.5  Inhaler 3  .  ranitidine (ZANTAC) 300 MG tablet Take 1 tablet (300 mg total) by mouth at bedtime. 90 tablet 1  . sertraline (ZOLOFT) 50 MG tablet Take 100 mg by mouth at bedtime.     . sucralfate (CARAFATE) 1 GM/10ML suspension TAKE 10 MLS (1 G TOTAL) BY MOUTH 2 (TWO) TIMES DAILY. 420 mL 1   No current facility-administered medications for this visit.     Allergies  Allergen Reactions  . Aspirin Rash      Review of Systems:   General:  + poor appetite, reduced energy, no weight gain, no weight loss, no fever  Cardiac:  ni chest pain with exertion, + chest pain at rest, +SOB with moderte exertion, + resting SOB, + PND, + orthopnea, no palpitations, no arrhythmia, no atrial fibrillation, + LE edema, + dizzy spells, no syncope  Respiratory:  + shortness of breath, no home oxygen, no productive cough, no dry cough, no bronchitis, + wheezing at night when lying down, no hemoptysis, no asthma, no pain with inspiration or cough, no sleep apnea, no CPAP at night  GI:   no difficulty swallowing, + reflux, + frequent heartburn, + hiatal hernia, no abdominal pain, no constipation, no diarrhea, no hematochezia, no hematemesis, no melena  GU:   no dysuria,  no frequency, no urinary tract infection, no hematuria, no kidney stones, no kidney disease  Vascular:  no pain suggestive of claudication, no pain in feet, + leg cramps, + varicose veins, no DVT, no non-healing foot ulcer  Neuro:   no stroke, no TIA's, no seizures, + headaches, no temporary blindness one eye,  no slurred speech, no peripheral neuropathy, no chronic pain, no instability of gait, no memory/cognitive dysfunction  Musculoskeletal: no arthritis, no joint swelling, no myalgias, no difficulty walking, normal mobility   Skin:   no rash, no itching, no skin infections, no pressure sores or ulcerations  Psych:   + anxiety, no depression, + nervousness, no unusual recent stress  Eyes:   no blurry vision, no floaters, + recent vision changes, +  wears glasses  ENT:   no hearing loss, no loose or painful teeth, no dentures, last saw dentist 05/11/2018  Hematologic:  no easy bruising, no abnormal bleeding, no clotting disorder, no frequent epistaxis  Endocrine:  no diabetes, does not check CBG's at home        Physical Exam:   BP (!) 175/92   Pulse 60   Resp 20   Ht 5' (1.524 m)   Wt 123 lb (55.8 kg)   LMP 04/26/1950   SpO2 96% Comment: RA  BMI 24.02 kg/m   General:  Elderly but  well-appearing  HEENT:  Unremarkable, [place]/AT, PERLA, EOMI, oropharynx clear, multiple missing teeth.  Neck:   no JVD, no bruits, no adenopathy or thyromegaly  Chest:   clear to auscultation, symmetrical breath sounds, no wheezes, no rhonchi   CV:   RRR, grade III/VI crescendo/decrescendo murmur heard best at RSB,  no diastolic murmur  Abdomen:  soft, non-tender, no masses or organomegaly  Extremities:  warm, well-perfused, pulses palpable, no LE edema  Rectal/GU  Deferred  Neuro:   Grossly non-focal and symmetrical throughout  Skin:   Clean and dry, no rashes, no breakdown   Diagnostic Tests:        [facility]                        [address]  [place], [place] [address]                            [phone]  ------------------------------------------------------------------- Transthoracic Echocardiography  Patient:    Samaia, Iwata MR #:       [id number] Study Date: 02/24/2018 Gender:     F Age:        18 Height:     160 cm Weight:     52.6 kg BSA:        1.53 m^2 Pt. Status: Room:   ATTENDING    Amedeo Kinsman  REFERRING    Dyann Kief  SONOGRAPHER  Aida Raider, RDCS  PERFORMING   [facility], Outpatient  cc:  ------------------------------------------------------------------- LV EF: 60% -   65%  ------------------------------------------------------------------- Indications:      R06.09  Dyspnea.  ------------------------------------------------------------------- History:   PMH:  Acquired from the patient and from the patient&'s chart.  PMH:  Anemia. Chronic anxiety. DVT.  Risk factors: Hypertension.  ------------------------------------------------------------------- Study Conclusions  - Left ventricle: The cavity size was normal. There was mild   concentric hypertrophy. Systolic function was normal. The   estimated ejection fraction was in the range of 60% to 65%. Wall   motion was normal; there were no regional wall motion   abnormalities. Features are consistent with a pseudonormal left   ventricular filling pattern, with concomitant abnormal relaxation   and increased filling pressure (grade 2 diastolic dysfunction). - Aortic valve: Valve mobility was moderately restricted. There was   moderate to severe stenosis. There was trivial regurgitation.   Calculated aortic valve area and dimensionless obstructive index   overestimate AS severity due to inaccurate LVOT pulsed wave   sampling location, Even so, the aortic stenosis is approaching   the severe range. - Pulmonary arteries: Systolic pressure was mildly increased. PA   peak pressure: 36 mm Hg (S).  Impressions:  - Aortic stenosis has worsened since 11/10/2015 and is approaching   severe range.  ------------------------------------------------------------------- Study data:   Study status:  Routine.  Procedure:  The patient reported no pain pre or post test. Transthoracic echocardiography for left ventricular function evaluation, for right ventricular function evaluation, and for assessment of valvular function. Image quality was adequate.  Study completion:  There were no complications.          Transthoracic echocardiography.  M-mode, complete 2D, spectral Doppler, and color Doppler.  Birthdate: Patient birthdate: 12-Nov-1932.  Age:  Patient is 85 yr old.  Sex: Gender: female.    BMI: 20.6  kg/m^2.  Blood pressure:     112/78 Patient status:  Outpatient.  Study date:  Study date: 02/24/2018. Study time: 10:32 AM.  Location:  [facility]  -------------------------------------------------------------------  ------------------------------------------------------------------- Left ventricle:  The cavity size was normal. There was mild concentric hypertrophy. Systolic function was normal. The estimated ejection fraction was in the range of 60% to 65%. Wall motion was normal; there were no regional wall motion abnormalities. Features are consistent with a pseudonormal left ventricular filling pattern, with concomitant abnormal relaxation and increased filling pressure (grade 2 diastolic dysfunction).  ------------------------------------------------------------------- Aortic valve:   Trileaflet; moderately thickened, moderately calcified leaflets. Valve mobility was moderately restricted. Doppler:   There was moderate to severe stenosis.   There was trivial regurgitation.    VTI ratio of LVOT to aortic valve: 0.26. Valve area (VTI): 0.9 cm^2. Indexed valve area (VTI): 0.59 cm^2/m^2.  Peak velocity ratio of LVOT to aortic valve: 0.24. Valve area (Vmax): 0.82 cm^2. Indexed valve area (Vmax): 0.53 cm^2/m^2. Mean velocity ratio of LVOT to aortic valve: 0.23. Valve area (Vmean): 0.8 cm^2. Indexed valve area (Vmean): 0.52 cm^2/m^2. Mean gradient (S): 35 mm Hg. Peak gradient (S): 65 mm Hg.  ------------------------------------------------------------------- Aorta:  Aortic root: The aortic root was normal in size. Ascending aorta: The ascending aorta was normal in size.  ------------------------------------------------------------------- Mitral valve:   Mildly thickened leaflets . Leaflet separation was normal.  Doppler:  Transvalvular velocity was within the normal range. There was no evidence for stenosis. There was trivial regurgitation.    Peak gradient (D): 5 mm  Hg.  ------------------------------------------------------------------- Left atrium:  The atrium was at the upper limits of normal in size.   ------------------------------------------------------------------- Right ventricle:  The cavity size was normal. Systolic function was normal.  ------------------------------------------------------------------- Pulmonic valve:   Poorly visualized.  The valve appears to be grossly normal.    Doppler:  There was mild regurgitation.  ------------------------------------------------------------------- Tricuspid valve:   Structurally normal valve.   Leaflet separation was normal.  Doppler:  Transvalvular velocity was within the normal range. There was mild regurgitation.  ------------------------------------------------------------------- Pulmonary artery:   Systolic pressure was mildly increased.  ------------------------------------------------------------------- Right atrium:  The atrium was normal in size.  ------------------------------------------------------------------- Pericardium:  There was no pericardial effusion.  ------------------------------------------------------------------- Systemic veins: Inferior vena cava: The vessel was normal in size. The respirophasic diameter changes were in the normal range (>= 50%), consistent with normal central venous pressure.  ------------------------------------------------------------------- Measurements   Left ventricle                           Value          Reference  LV ID, ED, PLAX chordal          (L)     38    mm       43 - 52  LV ID, ES, PLAX chordal                  24    mm       23 - 38  LV fx shortening, PLAX chordal           37    %        >=29  LV PW thickness, ED                      12    mm       ----------  IVS/LV PW ratio, ED                      1.06           <=1.3  Stroke volume, 2D                        71    ml       ----------  Stroke volume/bsa, 2D                     46    ml/m^2   ----------  LV e&', lateral                           6.85  cm/s     ----------  LV E/e&', lateral  16.06          ----------  LV e&', medial                            6.96  cm/s     ----------  LV E/e&', medial                          15.8           ----------  LV e&', average                           6.91  cm/s     ----------  LV E/e&', average                         15.93          ----------    Ventricular septum                       Value          Reference  IVS thickness, ED                        12.69 mm       ----------    LVOT                                     Value          Reference  LVOT ID, S                               21    mm       ----------  LVOT area                                3.46  cm^2     ----------  LVOT ID                                  19    mm       ----------  LVOT peak velocity, S                    94.9  cm/s     ----------  LVOT mean velocity, S                    64.5  cm/s     ----------  LVOT VTI, S                              25    cm       ----------  Stroke volume (SV), LVOT DP              86.6  ml       ----------  Stroke index (SV/bsa), LVOT DP           56.6  ml/m^2   ----------    Aortic valve  Value          Reference  Aortic valve peak velocity, S            402   cm/s     ----------  Aortic valve mean velocity, S            279   cm/s     ----------  Aortic valve VTI, S                      96.7  cm       ----------  Aortic mean gradient, S                  35    mm Hg    ----------  Aortic peak gradient, S                  65    mm Hg    ----------  VTI ratio, LVOT/AV                       0.26           ----------  Aortic valve area, VTI                   0.9   cm^2     ----------  Aortic valve area/bsa, VTI               0.59  cm^2/m^2 ----------  Velocity ratio, peak, LVOT/AV            0.24           ----------  Aortic valve area, peak  velocity         0.82  cm^2     ----------  Aortic valve area/bsa, peak              0.53  cm^2/m^2 ----------  velocity  Velocity ratio, mean, LVOT/AV            0.23           ----------  Aortic valve area, mean velocity         0.8   cm^2     ----------  Aortic valve area/bsa, mean              0.52  cm^2/m^2 ----------  velocity    Aorta                                    Value          Reference  Aortic root ID, ED                       29    mm       ----------  Ascending aorta ID, A-P, S               24    mm       ----------    Left atrium                              Value          Reference  LA ID, A-P, ES  37    mm       ----------  LA ID/bsa, A-P                   (H)     2.42  cm/m^2   <=2.2  LA volume, S                             53.3  ml       ----------  LA volume/bsa, S                         34.9  ml/m^2   ----------  LA volume, ES, 1-p A4C                   49    ml       ----------  LA volume/bsa, ES, 1-p A4C               32.1  ml/m^2   ----------  LA volume, ES, 1-p A2C                   53.6  ml       ----------  LA volume/bsa, ES, 1-p A2C               35.1  ml/m^2   ----------    Mitral valve                             Value          Reference  Mitral E-wave peak velocity              110   cm/s     ----------  Mitral A-wave peak velocity              109   cm/s     ----------  Mitral deceleration time                 218   ms       150 - 230  Mitral peak gradient, D                  5     mm Hg    ----------  Mitral E/A ratio, peak                   1              ----------    Pulmonary arteries                       Value          Reference  PA pressure, S, DP               (H)     36    mm Hg    <=30    Tricuspid valve                          Value          Reference  Tricuspid regurg peak velocity           287   cm/s     ----------  Tricuspid peak RV-RA gradient            33  mm Hg    ----------    Right atrium                              Value          Reference  RA ID, S-I, ES, A4C                      35.1  mm       34 - 49  RA area, ES, A4C                         10.7  cm^2     8.3 - 19.5  RA volume, ES, A/L                       27    ml       ----------  RA volume/bsa, ES, A/L                   17.7  ml/m^2   ----------    Systemic veins                           Value          Reference  Estimated CVP                            3     mm Hg    ----------    Right ventricle                          Value          Reference  RV ID, minor axis, ED, A4C base          36    mm       ----------  TAPSE                                    21.9  mm       ----------  RV pressure, S, DP               (H)     36    mm Hg    <=30  RV s&', lateral, S                        16.4  cm/s     ----------    Pulmonic valve                           Value          Reference  Pulmonic regurg velocity, ED             104   cm/s     ----------  Legend: (L)  and  (H)  mark values outside specified reference range.  ------------------------------------------------------------------- Prepared and Electronically Authenticated by  Thurmon Fair, MD 2019-11-01T14:06:38   Physicians   Panel Physicians Referring Physician Case Authorizing Physician  Kathleene Hazel, MD (Primary)    Procedures   RIGHT/LEFT HEART CATH AND CORONARY ANGIOGRAPHY  Conclusion     LV end diastolic  pressure is normal.  Severe aortic stenosis (peak to peak gradient 43 mmHg, mean gradient 37.7 mmHg, AVA 1.17 cm2)  No angiographic evidence of CAD   Will proceed with planning for TAVR. Pt will remain in the hospital tonight post cath. She has no family or friends who are able to stay with her at home tonight.    Indications   Severe aortic stenosis [I35.0 (ICD-10-CM)]  Procedural Details   Technical Details Indication: 85 yo female with anemia, severe aortic stenosis here today for cardiac cath/TAVR  workup.  Procedure: The risks, benefits, complications, treatment options, and expected outcomes were discussed with the patient. The patient and/or family concurred with the proposed plan, giving informed consent. The patient was brought to the cath lab after IV hydration was given. The patient was further sedated with Versed and Fentanyl. There was an IV catheter present in the right antecubital vein. This area was prepped and draped in a sterile fashion. The IV catheter was changed for a 5 French sheath. Right heart catheterization performed with a balloon tipped catheter. The right wrist was prepped and draped in a sterile fashion. 1% lidocaine was used for local anesthesia. Using the modified Seldinger access technique, a 5 French sheath was placed in the right radial artery. 3 mg Verapamil was given through the sheath. 3000 units IV heparin was given. Standard diagnostic catheters were used to perform selective coronary angiography. I crossed the aortic valve a JR4 catheter. LV pressures recorded. No LV gram performed. The sheath was removed from the right radial artery and a Terumo hemostasis band was applied at the arteriotomy site on the right wrist.    Estimated blood loss <50 mL.   During this procedure medications were administered to achieve and maintain moderate conscious sedation while the patient's heart rate, blood pressure, and oxygen saturation were continuously monitored and I was present face-to-face 100% of this time.  Medications  (Filter: Administrations occurring from 04/06/18 1200 to 04/06/18 1259)  Medication Rate/Dose/Volume Action  Date Time   Heparin (Porcine) in NaCl 1000-0.9 UT/500ML-% SOLN (mL) 500 mL Given 04/06/18 1204   Total dose as of 05/11/18 1655        500 mL        Heparin (Porcine) in NaCl 1000-0.9 UT/500ML-% SOLN (mL) 500 mL Given 04/06/18 1204   Total dose as of 05/11/18 1655        500 mL        fentaNYL (SUBLIMAZE) injection (mcg) 25 mcg Given 04/06/18  1211   Total dose as of 05/11/18 1655 25 mcg Given 1228   50 mcg        midazolam (VERSED) injection (mg) 1 mg Given 04/06/18 1211   Total dose as of 05/11/18 1655 1 mg Given 1228   2 mg        lidocaine (PF) (XYLOCAINE) 1 % injection (mL) 2 mL Given 04/06/18 1229   Total dose as of 05/11/18 1655 2 mL Given 1235   4 mL        Radial Cocktail/Verapamil only (mL)  Given 04/06/18 1235   Total dose as of 05/11/18 1655        Cannot be calculated        iohexol (OMNIPAQUE) 350 MG/ML injection (mL) 40 mL Given 04/06/18 1251   Total dose as of 05/11/18 1655        40 mL        heparin injection (Units) 3,000 Units Given 04/06/18 1238   Total  dose as of 05/11/18 1655        3,000 Units        amLODipine (NORVASC) tablet 10 mg (mg) *Not included in total MAR Hold 04/06/18 1200   Total dose as of 05/11/18 1655        Cannot be calculated        buPROPion (WELLBUTRIN XL) 24 hr tablet 150 mg (mg) *Not included in total MAR Hold 04/06/18 1200   Dosing weight:  53.5 kg        Total dose as of 05/11/18 1655        Cannot be calculated        chlorhexidine (PERIDEX) 0.12 % solution 15 mL (mL) *Not included in total MAR Hold 04/06/18 1200   Dosing weight:  53.6 kg        Total dose as of 05/11/18 1655        Cannot be calculated        chlorthalidone (HYGROTON) tablet 25 mg (mg) *Not included in total MAR Hold 04/06/18 1200   Dosing weight:  53.5 kg        Total dose as of 05/11/18 1655        Cannot be calculated        feeding supplement (BOOST / RESOURCE BREEZE) liquid 1 Container (Container) *Not included in total [facility] 04/06/18 1200   Dosing weight:  54.4 kg        Total dose as of 05/11/18 1655        Cannot be calculated        heparin injection 5,000 Units (Units) *Not included in total MAR Hold 04/06/18 1200   Dosing weight:  53.5 kg        Total dose as of 05/11/18 1655        Cannot be calculated        hydrALAZINE (APRESOLINE) injection 10 mg (mg) *Not included in total MAR  Hold 04/06/18 1200   Dosing weight:  54.4 kg        Total dose as of 05/11/18 1655        Cannot be calculated        Melatonin TABS 9 mg (mg) *Not included in total MAR Hold 04/06/18 1200   Dosing weight:  53.5 kg        Total dose as of 05/11/18 1655        Cannot be calculated        nitroGLYCERIN (NITROSTAT) SL tablet 0.4 mg (mg) *Not included in total MAR Hold 04/06/18 1200   Dosing weight:  53.5 kg        Total dose as of 05/11/18 1655        Cannot be calculated        ondansetron (ZOFRAN) injection 4 mg (mg) *Not included in total MAR Hold 04/06/18 1200   Dosing weight:  53.5 kg        Total dose as of 05/11/18 1655        Cannot be calculated        oxyCODONE-acetaminophen (PERCOCET/ROXICET) 5-325 MG per tablet 1 tablet (tablet) *Not included in total MAR Hold 04/06/18 1200   Dosing weight:  53.5 kg        Total dose as of 05/11/18 1655        Cannot be calculated        pantoprazole (PROTONIX) EC tablet 40 mg (mg) *Not included in total MAR Hold 04/06/18 1200   Dosing  weight:  53.5 kg        Total dose as of 05/11/18 1655        Cannot be calculated        polyethylene glycol (MIRALAX / GLYCOLAX) packet 17 g (g) *Not included in total MAR Hold 04/06/18 1200   Dosing weight:  53.5 kg        Total dose as of 05/11/18 1655        Cannot be calculated        polyvinyl alcohol (LIQUIFILM TEARS) 1.4 % ophthalmic solution 1 drop (drop) *Not included in total MAR Hold 04/06/18 1200   Dosing weight:  53.5 kg        Total dose as of 05/11/18 1655        Cannot be calculated        sertraline (ZOLOFT) tablet 100 mg (mg) *Not included in total MAR Hold 04/06/18 1200   Total dose as of 05/11/18 1655        Cannot be calculated        sodium chloride irrigation 0.9 % 200 mL (mL) *Not included in total MAR Hold 04/06/18 1200   Dosing weight:  53.6 kg        Total dose as of 05/11/18 1655        Cannot be calculated        sucralfate (CARAFATE) 1 GM/10ML suspension 1 g (g) *Not  included in total MAR Hold 04/06/18 1200   Total dose as of 05/11/18 1655        Cannot be calculated        Sedation Time   Sedation Time Physician-1: 34 minutes 2 seconds  Complications   Complications documented before study signed (04/06/2018 1:01 PM EST)    RIGHT/LEFT HEART CATH AND CORONARY ANGIOGRAPHY   None Documented by Kathleene Hazel, MD 04/06/2018 12:55 PM EST  Time Range: Intraprocedure      Coronary Findings   Diagnostic  Dominance: Right  Left Anterior Descending  Vessel is large.  Left Circumflex  Vessel is large.  Right Coronary Artery  Vessel is large.  Intervention   No interventions have been documented.  Right Heart   Right Heart Pressures LV EDP is normal.  Coronary Diagrams   Diagnostic  Dominance: Right    Intervention   Implants    No implant documentation for this case.  Syngo Images   Show images for CARDIAC CATHETERIZATION  MERGE Images   Show images for CARDIAC CATHETERIZATION   Link to Procedure Log   Procedure Log    Hemo Data    Most Recent Value  Fick Cardiac Output 5.58 L/min  Fick Cardiac Output Index 3.73 (L/min)/BSA  Aortic Mean Gradient 37.7 mmHg  Aortic Peak Gradient 43 mmHg  Aortic Valve Area 1.17  Aortic Value Area Index 0.78 cm2/BSA  RA A Wave 8 mmHg  RA V Wave 4 mmHg  RA Mean 4 mmHg  RV Systolic Pressure 28 mmHg  RV Diastolic Pressure 2 mmHg  RV EDP 5 mmHg  PA Systolic Pressure 21 mmHg  PA Diastolic Pressure 2 mmHg  PA Mean 7 mmHg  PW A Wave 9 mmHg  PW V Wave 6 mmHg  PW Mean 5 mmHg  AO Systolic Pressure 100 mmHg  AO Diastolic Pressure 57 mmHg  AO Mean 74 mmHg  LV Systolic Pressure 144 mmHg  LV Diastolic Pressure 8 mmHg  LV EDP 12 mmHg  AOp Systolic Pressure 98 mmHg  AOp Diastolic Pressure  52 mmHg  AOp Mean Pressure 69 mmHg  LVp Systolic Pressure 141 mmHg  LVp Diastolic Pressure 9 mmHg  LVp EDP Pressure 12 mmHg  QP/QS 1  TPVR Index 1.88 HRUI  TSVR Index 19.85 HRUI  PVR SVR  Ratio 0.03  TPVR/TSVR Ratio 0.09   ADDENDUM REPORT: 05/02/2018 14:54  CLINICAL DATA:  85 year old female with severe aortic stenosis being evaluated for a TAVR procedure.  EXAM: Cardiac TAVR CT  TECHNIQUE: The patient was scanned on a [employer]. A 120 kV retrospective scan was triggered in the descending thoracic aorta at 111 HU's. Gantry rotation speed was 250 msecs and collimation was .6 mm. No beta blockade or nitro were given. The 3D data set was reconstructed in 5% intervals of the R-R cycle. Systolic and diastolic phases were analyzed on a dedicated work station using MPR, MIP and VRT modes. The patient received 80 cc of contrast.  FINDINGS: Aortic Valve: Trileaflet aortic valve with severely thickened and only mildly calcified leaflets. Leaflets opening is moderately restricted.  Aorta: Normal size with mild diffuse atherosclerosis and calcifications and no dissection.  Sinotubular Junction: 24 x 24 mm  Ascending Thoracic Aorta: 27 x 27 mm  Aortic Arch: 24 x 23 mm  Descending Thoracic Aorta: 22 x 22 mm  Sinus of Valsalva Measurements:  Non-coronary: 27 mm  Right -coronary: 25 mm  Left -coronary: 26 mm  Sinus of Valsalva Height:  Right -coronary: 18 mm  Left -coronary: 20 mm  Left leaflet length: 12 mm  Right leaflet length: 12 mm  Coronary Artery Height above Annulus:  Left Main: 13 mm  Right Coronary: 15 mm  Virtual Basal Annulus Measurements:  Maximum/Minimum Diameter: 23.0 x 21.3 mm  Mean Diameter: 21.2 mm  Perimeter: 68.4 mm  Area: 354 mm2  Optimum Fluoroscopic Angle for Delivery: LAO 6 CAU 6  IMPRESSION: 1. Trileaflet aortic valve with severely thickened and only mildly calcified leaflets. Given only mild to moderate leaflet opening restriction but the fact that the annulus size is small this patient might benefit best from a 26 mm Evolut Pro Medtronic valve.  2. Sufficient coronary to  annulus distance.  3. Optimum Fluoroscopic Angle for Delivery: LAO 6 CAU 6.  4. No thrombus in the left atrial appendage.   Electronically Signed   By: Tobias Alexander   On: 05/02/2018 14:54   Addended by Lars Masson, MD on 05/02/2018 2:57 PM    Study Result   EXAM: OVER-READ INTERPRETATION  CT CHEST  The following report is an over-read performed by radiologist Dr. Trudie Reed of [facility] on 05/01/2018. This over-read does not include interpretation of cardiac or coronary anatomy or pathology. The coronary CTA interpretation by the cardiologist is attached.  COMPARISON:  Chest CT 07/08/2015.  FINDINGS: Extracardiac findings will be described separately under dictation for contemporaneously obtained CTA chest, abdomen and pelvis.  IMPRESSION: 1. Please see separate dictation for contemporaneously obtained CTA chest, abdomen and pelvis for full description of relevant extracardiac findings.  Electronically Signed: By: Trudie Reed M.D. On: 05/01/2018 10:47       CLINICAL DATA:  85 year old female with history of severe aortic stenosis. Preprocedural study prior to potential transcatheter aortic valve replacement (TAVR) procedure.  EXAM: CTA CHEST ABDOMEN AND PELVIS WITH CONTRAST  TECHNIQUE: Multidetector CT imaging of the chest, abdomen and pelvis was performed using the standard protocol during bolus administration of intravenous contrast. Multiplanar reconstructed images and MIPs were obtained and reviewed to evaluate the vascular  anatomy.  CONTRAST:  ISOVUE-370 IOPAMIDOL (ISOVUE-370) INJECTION 76%  COMPARISON:  Chest CT 07/08/2015. CT the abdomen and pelvis 05/14/2008.  FINDINGS: CTA CHEST FINDINGS  Cardiovascular: Heart size is normal. There is no significant pericardial fluid, thickening or pericardial calcification. Aortic atherosclerosis. No definite coronary artery calcifications. Thickening  and calcification of the aortic valve.  Mediastinum/Lymph Nodes: No pathologically enlarged mediastinal or hilar lymph nodes. Moderate-sized hiatal hernia. No axillary lymphadenopathy.  Lungs/Pleura: No suspicious appearing pulmonary nodules or masses are noted. No acute consolidative airspace disease. No pleural effusions.  Musculoskeletal/Soft Tissues: There are no aggressive appearing lytic or blastic lesions noted in the visualized portions of the skeleton.  CTA ABDOMEN AND PELVIS FINDINGS  Hepatobiliary: 1.6 cm intermediate attenuation (37 HU) lesion in segment 2 of the liver, incompletely characterized but only slightly larger than remote prior study from 2010, favored to represent a benign lesion. No other larger more suspicious appearing hepatic lesions. No intra or extrahepatic biliary ductal dilatation. Gallbladder is normal in appearance.  Pancreas: No pancreatic mass. No pancreatic ductal dilatation. No pancreatic or peripancreatic fluid or inflammatory changes.  Spleen: Unremarkable.  Adrenals/Urinary Tract: Bilateral kidneys and bilateral adrenal glands are normal in appearance. No hydroureteronephrosis. Urinary bladder is normal in appearance.  Stomach/Bowel: Normal appearance of the intra-abdominal portion of the stomach. No pathologic dilatation of small bowel or colon. Numerous colonic diverticulae are noted, without surrounding inflammatory changes to suggest an acute diverticulitis at this time. The appendix is not confidently identified and may be surgically absent. Regardless, there are no inflammatory changes noted adjacent to the cecum to suggest the presence of an acute appendicitis at this time.  Vascular/Lymphatic: Aortic atherosclerosis, without evidence of aneurysm or dissection in the abdominal or pelvic vasculature. Vascular findings and measurements pertinent to potential TAVR procedure, as detailed below. No lymphadenopathy noted  in the abdomen or pelvis.  Reproductive: Status post hysterectomy. Ovaries are unremarkable in appearance.  Other: No significant volume of ascites.  No pneumoperitoneum.  Musculoskeletal: There are no aggressive appearing lytic or blastic lesions noted in the visualized portions of the skeleton.  VASCULAR MEASUREMENTS PERTINENT TO TAVR:  AORTA:  Minimal Aortic Diameter-15 x 17 mm  Severity of Aortic Calcification-mild  RIGHT PELVIS:  Right Common Iliac Artery -  Minimal Diameter-8.1 x 8.1 mm  Tortuosity-mild  Calcification-mild  Right External Iliac Artery -  Minimal Diameter-6.8 x 6.4 mm  Tortuosity-moderate to severe  Calcification-mild  Right Common Femoral Artery -  Minimal Diameter-6.1 x 7.3 mm  Tortuosity-mild  Calcification-mild  LEFT PELVIS:  Left Common Iliac Artery -  Minimal Diameter-8.0 x 7.3 mm  Tortuosity-mild  Calcification-mild  Left External Iliac Artery -  Minimal Diameter-6.9 x 7.0 mm  Tortuosity-moderate  Calcification-none  Left Common Femoral Artery -  Minimal Diameter-6.6 x 7.4 mm  Tortuosity-mild  Calcification-mild  Review of the MIP images confirms the above findings.  IMPRESSION: 1. Vascular findings and measurements pertinent to potential TAVR procedure, as detailed above. 2. Severe thickening calcification of the aortic valve, compatible with the reported clinical history of severe aortic stenosis. 3. Moderate-sized hiatal hernia. 4. 1.6 cm indeterminate lesion in segment 2 of the liver, only slightly larger than remote prior study from 2010, favored to be benign (likely a mildly proteinaceous cyst). 5. Colonic diverticulosis without evidence of acute diverticulitis at this time. 6. Additional incidental findings, as above.   Electronically Signed   By: Trudie Reed M.D.   On: 05/01/2018 12:24   STS Risk Score:  Procedure: Isolated  AVR  Risk of  Mortality: 3.284%  Renal Failure: 2.193%  Permanent Stroke: 2.373%  Prolonged Ventilation: 10.341%  DSW Infection: 0.037%  Reoperation: 5.003%  Morbidity or Mortality: 16.527%  Short Length of Stay: 23.290%  Long Length of Stay: 7.561%    Impression:  This 85 year old woman has stage D, severe, symptomatic aortic stenosis with New York Heart Association class II-III symptoms of exertional shortness of breath and fatigue as well as 3 prior episodes of dizziness and presyncope.  She also has shortness of breath, wheezing, and burning chest pain when lying down at night which are most likely related to her severe reflux with esophagitis.  I have personally reviewed her 2D echocardiogram, cardiac catheterization, and CTA studies.  Echocardiogram from 02/24/2018 shows a trileaflet aortic valve with moderately thickened and calcified leaflets with restricted mobility.  The mean gradient across aortic valve was 35 mmHg with a dimensionless index of 0.24 and aortic valve area of 0.8 cm consistent with severe aortic stenosis.  Left ventricular systolic function is normal with grade 2 diastolic dysfunction.  Her cardiac catheterization shows no angiographic evidence of coronary disease.  The mean gradient was 38 mmHg and the peak to peak gradient was 43 mmHg consistent with severe aortic stenosis.  I agree that aortic valve replacement is indicated in this patient for relief of her symptoms and to prevent progressive left ventricular deterioration.  This will also allow her to safely undergo further surgery for her hiatal hernia and reflux which is very symptomatic.  I think transcatheter aortic valve replacement would be the best treatment for this 85 year old woman.  Her gated cardiac CTA shows anatomy favorable for transcatheter aortic valve replacement.  She has a relatively small annulus and would likely benefit from using a Medtronic Evolut valve.  Her CTA of the abdomen and pelvis shows adequate pelvic  vascular anatomy to allow transfemoral insertion by an 18 French sheath if a Medtronic valve is used.  The patient was counseled at length regarding treatment alternatives for management of severe symptomatic aortic stenosis. The risks and benefits of surgical intervention has been discussed in detail. Long-term prognosis with medical therapy was discussed. Alternative approaches such as conventional surgical aortic valve replacement, transcatheter aortic valve replacement, and palliative medical therapy were compared and contrasted at length. This discussion was placed in the context of the patient's own specific clinical presentation and past medical history. All of her questions have been addressed.   Following the decision to proceed with transcatheter aortic valve replacement, a discussion was held regarding what types of management strategies would be attempted intraoperatively in the event of life-threatening complications, including whether or not the patient would be considered a candidate for the use of cardiopulmonary bypass and/or conversion to open sternotomy for attempted surgical intervention. The patient is aware of the fact that transient use of cardiopulmonary bypass may be necessary.The patient has been advised of a variety of complications that might develop including but not limited to risks of death, stroke, paravalvular leak, aortic dissection or other major vascular complications, aortic annulus rupture, device embolization, cardiac rupture or perforation, mitral regurgitation, acute myocardial infarction, arrhythmia, heart block or bradycardia requiring permanent pacemaker placement, congestive heart failure, respiratory failure, renal failure, pneumonia, infection, other late complications related to structural valve deterioration or migration, or other complications that might ultimately cause a temporary or permanent loss of functional independence or other long term morbidity. The  patient provides full informed consent for the procedure as described and all questions were  answered.     Plan:  Transfemoral transcatheter aortic valve replacement will tentatively be scheduled for 05/30/2018, probably with a Medtronic Evolut valve.   I spent 45 minutes performing this consultation and > 50% of this time was spent face to face counseling and coordinating the care of this patient's severe symptomatic aortic stenosis.   Alleen Borne, MD 05/11/2018 4:42 PM

## 2018-05-11 NOTE — Therapy (Signed)
[facility] [address] [place], [place], [address] Phone: [phone]   Fax:  [phone]  Physical Therapy Evaluation/TAVR  Patient Details  Name: Sherri Mcmillan MRN: [id number] Date of Birth: 1932/05/06 Referring Provider (PT): Janetta Hora, [place]   Encounter Date: 05/11/2018  PT End of Session - 05/11/18 1806    Visit Number  1    PT Start Time  1720    PT Stop Time  1752    PT Time Calculation (min)  32 min    Activity Tolerance  Patient tolerated treatment well    Behavior During Therapy  [facility] for tasks assessed/performed       Past Medical History:  Diagnosis Date  . Anemia 12/14/2012  . Ataxia 06/24/2016  . Auditory hallucinations 08/19/2010   Has has auditory hallucinations, which seem to have worsened since death of her sister 2010-02-11. Admission to [facility] (90s or early 2000)  for 6 weeks after mother died, also with hallucinations.  B/C of severe mental illness and refusal to see pysch, I have refused to refill controlled meds. If she decides to pursue mental health, then she needs to take the initiative and sch the appt bc [name] has s  . Barrett's esophagus    Demonstrated on EGD 02/12/2011. EGD 09/17/2012 shows inflamed GE junctional mucosa without metaplasia, dysplasia, or malignancy.  . Cataract   . Chronic anxiety    Admission to [facility] (90s or early 2000)  for 6 weeks after mother died. Complicated again by death of her sister 2010. 2012 developed hallucinations and I refused to refill controlled meds unless she see psych which she is not agreable to  . Chronic insomnia   . Depression   . DVT (deep venous thrombosis) (HCC)   . Gastroparesis    Demonstrated on GES 02-12-2011 by Dr Dalene Seltzer  . GERD (gastroesophageal reflux disease)   . H/O hiatal hernia   . HLD (hyperlipidemia) 01/2010  . HTN (hypertension)    Controlled with 2 drug therapy  . Left leg DVT (HCC) 09/28/2012  . Migraine   . Muscle spasm 09/29/2017  .  Severe aortic stenosis     Past Surgical History:  Procedure Laterality Date  . 24 HOUR PH STUDY N/A 08/24/2017   Procedure: 24 HOUR PH STUDY IMPEDANCE;  Surgeon: Sherrilyn Rist, MD;  Location: [facility] ENDOSCOPY;  Service: Gastroenterology;  Laterality: N/A;  . ABDOMINAL HYSTERECTOMY    . APPENDECTOMY    . CATARACT EXTRACTION     left eye  . COLONOSCOPY  2000&2005   Dr.Magod  . DIRECT LARYNGOSCOPY   02-12-07   preoperative diagnosis hoarseness with anterior right vocal cord lesion -  direct laryngoscopy and excisional biopsy of right anterior vocal cord lesion done by Dr. Ezzard Standing  . ENDOVENOUS ABLATION SAPHENOUS VEIN W/ LASER Left 05-09-2014   EVLA left small saphenous vein by Gretta Began MD  . ESOPHAGEAL MANOMETRY N/A 08/24/2017   Procedure: ESOPHAGEAL MANOMETRY (EM);  Surgeon: Sherrilyn Rist, MD;  Location: [facility] ENDOSCOPY;  Service: Gastroenterology;  Laterality: N/A;  . ESOPHAGOGASTRODUODENOSCOPY  11/2010  . ESOPHAGOGASTRODUODENOSCOPY N/A 09/17/2012   Procedure: ESOPHAGOGASTRODUODENOSCOPY (EGD);  Surgeon: Hart Carwin, MD;  Location: [facility] ENDOSCOPY;  Service: Endoscopy;  Laterality: N/A;  . ESOPHAGOGASTRODUODENOSCOPY N/A 12/17/2012   Procedure: ESOPHAGOGASTRODUODENOSCOPY (EGD);  Surgeon: Beverley Fiedler, MD;  Location: [facility] ENDOSCOPY;  Service: Gastroenterology;  Laterality: N/A;  . EXCISION MASS HEAD N/A 03/05/2016   Procedure: EXCISION POSTERIOR  SCALP MASS;  Surgeon: Axel FillerArmando Ramirez, MD;  [facility];  Service: General;  Laterality: N/A;  . EYE SURGERY    . HEMORRHOID SURGERY    . KNEE ARTHROSCOPY    . LAPAROSCOPIC NISSEN FUNDOPLICATION N/A 05/02/2013   Procedure: LAPAROSCOPIC NISSEN FUNDOPLICATION;  Surgeon: Valarie MerinoMatthew B Martin, MD;  Location: [facility] ORS;  Service: General;  Laterality: N/A;  . MENISCECTOMY   July 2002    preoperative diagnosis torn medial meniscus right knee, partial medial meniscectomy, debridement chondroplasty patellofemoral joint, done by Dr. Madelon Lipsaffrey  . POLYPECTOMY   2000   Dr.Magod  . RIGHT/LEFT HEART CATH AND CORONARY ANGIOGRAPHY N/A 04/06/2018   Procedure: RIGHT/LEFT HEART CATH AND CORONARY ANGIOGRAPHY;  Surgeon: Kathleene HazelMcAlhany, Christopher D, MD;  Location: [facility] INVASIVE CV LAB;  Service: Cardiovascular;  Laterality: N/A;  . ROTATOR CUFF REPAIR  09/21/2011   rt shoulder  . TOOTH EXTRACTION Bilateral 04/05/2018   Procedure: Extraction of tooth #'s 6 and 29 with alveoloplasty and gross debridement of remaining teeth;  Surgeon: Charlynne PanderKulinski, Ronald F, DDS;  Location: [facility] OR;  Service: Oral Surgery;  Laterality: Bilateral;    There were no vitals filed for this visit.   Subjective Assessment - 05/11/18 1729    Subjective  I was walking 4-5 blocks/day but now I can only do about 1 before I start wheezing, began around Nov 2018. Dizziness and HA associated. N/T Rt hand and sometimes have pain in legs. Denies overall pain right now.     Currently in Pain?  No/denies         [facility] PT Assessment - 05/11/18 0001      Assessment   Medical Diagnosis  severe aortic stenosis    Referring Provider (PT)  Janetta Horahompson, Kathryn R, PA-C    Onset Date/Surgical Date  --   about 1 year     Precautions   Precautions  None      Restrictions   Weight Bearing Restrictions  No      Balance Screen   Has the patient fallen in the past 6 months  Yes    How many times?  3    Has the patient had a decrease in activity level because of a fear of falling?   No    Is the patient reluctant to leave their home because of a fear of falling?   No      Prior Function   Level of Independence  Independent      Cognition   Overall Cognitive Status  Within Functional Limits for tasks assessed      Sensation   Additional Comments  reports occasional pain in legs      Posture/Postural Control   Posture Comments  WFL      ROM / Strength   AROM / PROM / Strength  AROM;Strength      AROM   Overall AROM Comments  WFL      Strength   Overall Strength Comments  gross UE strength 5/5     Strength Assessment Site  Hand    Right/Left hand  Right;Left    Right Hand Grip (lbs)  30    Left Hand Grip (lbs)  25       [facility] Pre-Surgical Assessment - 05/11/18 0001    5 Meter Walk Test- trial 1  4 sec    5 Meter Walk Test- trial 2  3 sec.     5 Meter Walk Test- trial 3  4 sec.    5 meter  walk test average  3.67 sec    4 Stage Balance Test Position  3    Sit To Stand Test- trial 1  16 sec.    Comment  HR to 96 BPM    6 Minute Walk- Baseline  yes    BP (mmHg)  169/87    HR (bpm)  55    02 Sat (%RA)  99 %    Modified Borg Scale for Dyspnea  0- Nothing at all    Perceived Rate of Exertion (Borg)  6-    6 Minute Walk Post Test  yes    BP (mmHg)  (!) 178/91    HR (bpm)  72    02 Sat (%RA)  88 %    Modified Borg Scale for Dyspnea  7- Severe shortness of breath or very hard breathing    Perceived Rate of Exertion (Borg)  13- Somewhat hard    Aerobic Endurance Distance Walked  1367    Endurance additional comments  no rest breaks requested              Objective measurements completed on examination: See above findings.             Clinical Impression Statement: Pt is a 85 yo F presenting to OP PT for evaluation prior to possible TAVR surgery due to severe aortic stenosis. Pt reports onset of SOB about 1 year ago. Symptoms are  limiting distance walking. Pt presents with WFL ROM and strength and denies musculoskeletal pain. Pt ambulated a total of 1367 feet in 6 minute walk and reported 7/10 SOB on modified scale for dyspena and 13/20 RPE on Borg's perceived exertion and pain scale at the end of the walk. During the 6 minute walk test, patient's HR increased to 72 BPM and O2 saturation decreased to 88%. Based on the Short Physical Performance Battery, patient has a frailty rating of 9/12 with </= 5/12 considered frail.               Plan - 05/11/18 1806    PT Frequency  --   one time TAVR evaluation   Consulted and Agree with Plan of Care  Patient        Visit Diagnosis: Difficulty in walking, not elsewhere classified     Problem List Patient Active Problem List   Diagnosis Date Noted  . Severe aortic stenosis 03/06/2018  . Preoperative clearance 12/27/2017  . Aortic regurgitation 12/27/2017  . Drug overdose 09/20/2017  . Vitamin D deficiency 06/30/2016  . Aortic atherosclerosis (HCC) 06/24/2016  . Iron deficiency anemia due to chronic blood loss 06/24/2016  . Abnormal finding on MRI of brain 06/24/2016  . Chronic bronchitis (HCC) 08/14/2015  . Allergic rhinitis 07/03/2015  . Osteoporosis 02/19/2014  . Pulmonary nodule 02/10/2014  . S/P Nissen fundoplication (without gastrostomy tube) procedure 05/02/2013  . Hiatal hernia 02/19/2013  . Hypokalemia 02/03/2013  . Atypical chest pain 09/16/2012  . GERD (gastroesophageal reflux disease) 08/03/2010  . Hyperlipidemia   . Anxiety and depression   . HTN (hypertension)   . Aortic stenosis   . Epidermiod Cyst 03/12/2008  . Insomnia 07/27/2006  . Chronic venous insufficiency 07/06/2006     C.  PT, DPT 05/11/18 6:09 PM   [facility] [address] [place], [place], [address] Phone: [phone]   Fax:  [phone]  Name: Sherri Mcmillan MRN: [id number] Date of Birth: 1932-10-12

## 2018-05-11 NOTE — Patient Instructions (Signed)
PLAN: 1. Brush teeth after meals and at bedtime. Floss at bedtime. 2. Follow-up with Dr. Gala Romney for evaluation for continued dental care and replacement of missing teeth. 3. Patient is cleared for heart valve surgery.   Charlynne Pander, DDS

## 2018-05-11 NOTE — Progress Notes (Signed)
PROGRESS NOTE:  05/11/2018 Sherri Mcmillan [id number]  VITALS: BP (!) 164/112 (BP Location: Right Arm)   Pulse 71   Temp 98 F (36.7 C)   LMP 04/26/1950   LABS:  Lab Results  Component Value Date   WBC 4.4 04/07/2018   HGB 10.5 (L) 04/07/2018   HCT 35.4 (L) 04/07/2018   MCV 78.5 (L) 04/07/2018   PLT 107 (L) 04/07/2018   BMET    Component Value Date/Time   NA 139 04/07/2018 0505   NA 141 03/01/2018 1659   K 3.1 (L) 04/07/2018 0505   CL 101 04/07/2018 0505   CO2 26 04/07/2018 0505   GLUCOSE 94 04/07/2018 0505   BUN 9 04/07/2018 0505   BUN 13 03/01/2018 1659   CREATININE 0.95 04/07/2018 0505   CREATININE 0.83 04/10/2014 1426   CALCIUM 9.0 04/07/2018 0505   GFRNONAA 55 (L) 04/07/2018 0505   GFRNONAA 67 04/10/2014 1426   GFRAA >60 04/07/2018 0505   GFRAA 78 04/10/2014 1426    Lab Results  Component Value Date   INR 1.03 09/20/2017   INR 1.09 06/24/2016   INR 1.10 06/24/2016   No results found for: PTT   Sherri Mcmillan is status post extraction of tooth numbers 6 and 29 with alveoloplasty and gross debridement of remaining dentition in the operating with general anesthesia on 04/05/2018. The patient was seen for postop evaluation on 04/06/2018. Patient now presents for reevaluation of healing from dental extractions.  SUBJECTIVE: Patient complaining of some irritation that occurred in her mouth when she performed the PFTs as part of her preop clearance. Patient points to the area of numbers 6 was the area that was traumatized.  EXAM: There is no sign of infection, heme, or ooze. No sutures remain.The extraction site areas have healed in well. There is slight erythema to the upper right buccal aspect tissues in the area of numbers 5 through 7 secondary to possible trauma during the PFTs.  This is in an area with lack of attached gingiva.  ASSESSMENT: Post operative course is consistent with dental procedures performed in the OR. Loss of teeth due to  extraction Multiple missing teeth Malocclusion Questionable trauma to the soft tissues in the area of #5 through 7 during the PFTs.  PLAN: 1. Brush teeth after meals and at bedtime. Floss at bedtime. 2. Follow-up with Dr. Gala RomneyMcMasters for evaluation for continued dental care and replacement of missing teeth. 3. Patient is cleared for heart valve surgery.   Sherri Panderonald F. , DDS

## 2018-05-15 ENCOUNTER — Other Ambulatory Visit: Payer: Self-pay | Admitting: Physician Assistant

## 2018-05-15 ENCOUNTER — Encounter: Payer: Self-pay | Admitting: Physician Assistant

## 2018-05-18 ENCOUNTER — Encounter: Payer: Self-pay | Admitting: Thoracic Surgery (Cardiothoracic Vascular Surgery)

## 2018-05-25 NOTE — Pre-Procedure Instructions (Signed)
Desirre WHITNEE NOBLIT  05/25/2018      [id number] [place], [place] - [address] [place] [address] Phone: [phone] Fax: [phone]    Your procedure is scheduled on FEBRUARY 4  Report to [facility] Admitting at 0530 A.M.  Call this number if you have problems the morning of surgery:  [phone]   Remember:  Do not eat or drink after midnight.    Take these medicines the morning of surgery with A SIP OF WATER NONE    Do not wear jewelry, make-up or nail polish.  Do not wear lotions, powders, or perfumes, or deodorant.  Do not shave 48 hours prior to surgery.  Men may shave face and neck.  Do not bring valuables to the hospital.  [facility] is not responsible for any belongings or valuables.  Contacts, dentures or bridgework may not be worn into surgery.  Leave your suitcase in the car.  After surgery it may be brought to your room.  For patients admitted to the hospital, discharge time will be determined by your treatment team.  Patients discharged the day of surgery will not be allowed to drive home.    Special instructions:   [place]- Preparing For Surgery  Before surgery, you can play an important role. Because skin is not sterile, your skin needs to be as free of germs as possible. You can reduce the number of germs on your skin by washing with CHG (chlorahexidine gluconate) Soap before surgery.  CHG is an antiseptic cleaner which kills germs and bonds with the skin to continue killing germs even after washing.    Oral Hygiene is also important to reduce your risk of infection.  Remember - BRUSH YOUR TEETH THE MORNING OF SURGERY WITH YOUR REGULAR TOOTHPASTE  Please do not use if you have an allergy to CHG or antibacterial soaps. If your skin becomes reddened/irritated stop using the CHG.  Do not shave (including legs and underarms) for at least 48 hours prior to first CHG shower. It is OK to shave your  face.  Please follow these instructions carefully.   1. Shower the NIGHT BEFORE SURGERY and the MORNING OF SURGERY with CHG.   2. If you chose to wash your hair, wash your hair first as usual with your normal shampoo.  3. After you shampoo, rinse your hair and body thoroughly to remove the shampoo.  4. Use CHG as you would any other liquid soap. You can apply CHG directly to the skin and wash gently with a scrungie or a clean washcloth.   5. Apply the CHG Soap to your body ONLY FROM THE NECK DOWN.  Do not use on open wounds or open sores. Avoid contact with your eyes, ears, mouth and genitals (private parts). Wash Face and genitals (private parts)  with your normal soap.  6. Wash thoroughly, paying special attention to the area where your surgery will be performed.  7. Thoroughly rinse your body with warm water from the neck down.  8. DO NOT shower/wash with your normal soap after using and rinsing off the CHG Soap.  9. Pat yourself dry with a CLEAN TOWEL.  10. Wear CLEAN PAJAMAS to bed the night before surgery, wear comfortable clothes the morning of surgery  11. Place CLEAN SHEETS on your bed the night of your first shower and DO NOT SLEEP WITH PETS.    Day of Surgery:  Do not apply any  deodorants/lotions.  Please wear clean clothes to the hospital/surgery center.   Remember to brush your teeth WITH YOUR REGULAR TOOTHPASTE.    Please read over the following fact sheets that you were given.

## 2018-05-26 ENCOUNTER — Encounter (HOSPITAL_COMMUNITY): Payer: Self-pay

## 2018-05-26 ENCOUNTER — Encounter (HOSPITAL_COMMUNITY)
Admission: RE | Admit: 2018-05-26 | Discharge: 2018-05-26 | Disposition: A | Discharge status: Home / Self Care | Payer: Medicare Other | Source: Ambulatory Visit | Attending: Cardiovascular Disease | Admitting: Cardiovascular Disease

## 2018-05-26 ENCOUNTER — Ambulatory Visit (HOSPITAL_COMMUNITY)
Admission: RE | Admit: 2018-05-26 | Discharge: 2018-05-26 | Disposition: A | Discharge status: Home / Self Care | Payer: Medicare Other | Source: Ambulatory Visit | Attending: Physician Assistant | Admitting: Physician Assistant

## 2018-05-26 ENCOUNTER — Other Ambulatory Visit: Payer: Self-pay

## 2018-05-26 DIAGNOSIS — Z01811 Encounter for preprocedural respiratory examination: Secondary | ICD-10-CM | POA: Diagnosis not present

## 2018-05-26 DIAGNOSIS — I350 Nonrheumatic aortic (valve) stenosis: Secondary | ICD-10-CM | POA: Diagnosis not present

## 2018-05-26 LAB — URINALYSIS, ROUTINE W REFLEX MICROSCOPIC
Bilirubin Urine: NEGATIVE
Glucose, UA: NEGATIVE mg/dL
Hgb urine dipstick: NEGATIVE
Ketones, ur: NEGATIVE mg/dL
Leukocytes, UA: NEGATIVE
Nitrite: NEGATIVE
Protein, ur: NEGATIVE mg/dL
Specific Gravity, Urine: 1.021 (ref 1.005–1.030)
pH: 5 (ref 5.0–8.0)

## 2018-05-26 LAB — HEMOGLOBIN A1C
Hgb A1c MFr Bld: 5.3 % (ref 4.8–5.6)
MEAN PLASMA GLUCOSE: 105.41 mg/dL

## 2018-05-26 LAB — COMPREHENSIVE METABOLIC PANEL
ALT: 15 U/L (ref 0–44)
ANION GAP: 9 (ref 5–15)
AST: 20 U/L (ref 15–41)
Albumin: 4.4 g/dL (ref 3.5–5.0)
Alkaline Phosphatase: 59 U/L (ref 38–126)
BUN: 16 mg/dL (ref 8–23)
CO2: 26 mmol/L (ref 22–32)
Calcium: 10 mg/dL (ref 8.9–10.3)
Chloride: 104 mmol/L (ref 98–111)
Creatinine, Ser: 0.94 mg/dL (ref 0.44–1.00)
GFR calc Af Amer: >60 mL/min (ref >60)
GFR calc non Af Amer: 56 mL/min — ABNORMAL LOW (ref >60)
Glucose, Bld: 80 mg/dL (ref 70–99)
POTASSIUM: 4 mmol/L (ref 3.5–5.1)
Sodium: 139 mmol/L (ref 135–145)
Total Bilirubin: 0.8 mg/dL (ref 0.3–1.2)
Total Protein: 8.3 g/dL — ABNORMAL HIGH (ref 6.5–8.1)

## 2018-05-26 LAB — CBC
HCT: 40.4 % (ref 36.0–46.0)
Hemoglobin: 12.8 g/dL (ref 12.0–15.0)
MCH: 26.5 pg (ref 26.0–34.0)
MCHC: 31.7 g/dL (ref 30.0–36.0)
MCV: 83.6 fL (ref 80.0–100.0)
Platelets: 153 10*3/uL (ref 150–400)
RBC: 4.83 MIL/uL (ref 3.87–5.11)
RDW: 22.7 % — AB (ref 11.5–15.5)
WBC: 3 10*3/uL — ABNORMAL LOW (ref 4.0–10.5)
nRBC: 0 % (ref 0.0–0.2)

## 2018-05-26 LAB — BRAIN NATRIURETIC PEPTIDE: B Natriuretic Peptide: 75.5 pg/mL (ref 0.0–100.0)

## 2018-05-26 LAB — TYPE AND SCREEN
ABO/RH(D): O POS
Antibody Screen: NEGATIVE

## 2018-05-26 LAB — SURGICAL PCR SCREEN
MRSA, PCR: NEGATIVE
Staphylococcus aureus: NEGATIVE

## 2018-05-26 LAB — APTT: aPTT: 32 seconds (ref 24–36)

## 2018-05-26 LAB — PROTIME-INR
INR: 1.07
Prothrombin Time: 13.8 seconds (ref 11.4–15.2)

## 2018-05-26 NOTE — Progress Notes (Signed)
[facility] - [facility] Cardiologist - [facility]  Chest x-ray - today EKG - today Stress Test -  ECHO - 05/15/18 Cardiac Cath - 04/06/2018  Sleep Study - none CPAP - none  Fasting Blood Sugar - N/A Checks Blood Sugar _____ times a day  Blood Thinner Instructions:n/a Aspirin Instructions: n/a  Anesthesia review: will send   Patient denies shortness of breath, fever, cough and chest pain at PAT appointment   Patient verbalized understanding of instructions that were given to them at the PAT appointment. Patient was also instructed that they will need to review over the PAT instructions again at home before surgery.

## 2018-05-29 ENCOUNTER — Encounter: Payer: Self-pay | Admitting: Physician Assistant

## 2018-05-29 DIAGNOSIS — K2270 Barrett's esophagus without dysplasia: Secondary | ICD-10-CM | POA: Insufficient documentation

## 2018-05-29 MED ORDER — POTASSIUM CHLORIDE 2 MEQ/ML IV SOLN
80.0000 meq | INTRAVENOUS | Status: DC
  Filled 2018-05-29: qty 40

## 2018-05-29 MED ORDER — NOREPINEPHRINE BITARTRATE 1 MG/ML IV SOLN
0.0000 ug/min | INTRAVENOUS | Status: DC
  Filled 2018-05-29: qty 4

## 2018-05-29 MED ORDER — SODIUM CHLORIDE 0.9 % IV SOLN
INTRAVENOUS | Status: DC
  Filled 2018-05-29: qty 30

## 2018-05-29 MED ORDER — DOPAMINE-DEXTROSE 3.2-5 MG/ML-% IV SOLN
0.0000 ug/kg/min | INTRAVENOUS | Status: DC
  Filled 2018-05-29: qty 250

## 2018-05-29 MED ORDER — SODIUM CHLORIDE 0.9 % IV SOLN
1.5000 g | INTRAVENOUS | Status: AC
  Administered 2018-05-30: 1.5 g via INTRAVENOUS
  Filled 2018-05-29: qty 1.5

## 2018-05-29 MED ORDER — PHENYLEPHRINE HCL-NACL 20-0.9 MG/250ML-% IV SOLN
30.0000 ug/min | INTRAVENOUS | Status: DC
  Filled 2018-05-29: qty 250

## 2018-05-29 MED ORDER — INSULIN REGULAR(HUMAN) IN NACL 100-0.9 UT/100ML-% IV SOLN
INTRAVENOUS | Status: DC
  Filled 2018-05-29: qty 100

## 2018-05-29 MED ORDER — VANCOMYCIN HCL IN DEXTROSE 1-5 GM/200ML-% IV SOLN
1000.0000 mg | INTRAVENOUS | Status: AC
  Administered 2018-05-30: 1000 mg via INTRAVENOUS
  Filled 2018-05-29: qty 200

## 2018-05-29 MED ORDER — DEXMEDETOMIDINE HCL IN NACL 400 MCG/100ML IV SOLN
0.1000 ug/kg/h | INTRAVENOUS | Status: AC
  Administered 2018-05-30: 0.7 ug/kg/h via INTRAVENOUS
  Filled 2018-05-29: qty 100

## 2018-05-29 MED ORDER — MAGNESIUM SULFATE 50 % IJ SOLN
40.0000 meq | INTRAMUSCULAR | Status: DC
  Filled 2018-05-29: qty 9.85

## 2018-05-29 MED ORDER — EPINEPHRINE PF 1 MG/ML IJ SOLN
0.0000 ug/min | INTRAVENOUS | Status: DC
  Filled 2018-05-29: qty 4

## 2018-05-29 MED ORDER — NITROGLYCERIN IN D5W 200-5 MCG/ML-% IV SOLN
2.0000 ug/min | INTRAVENOUS | Status: DC
  Filled 2018-05-29: qty 250

## 2018-05-29 NOTE — Anesthesia Preprocedure Evaluation (Addendum)
Anesthesia Evaluation  Patient identified by MRN, date of birth, ID band Patient awake    Reviewed: Allergy & Precautions, Patient's Chart, lab work & pertinent test results  Airway Mallampati: III  TM Distance: >3 FB Neck ROM: Full    Dental  (+) Missing,    Pulmonary former smoker,    Pulmonary exam normal breath sounds clear to auscultation       Cardiovascular hypertension, Pt. on medications + DVT   Rhythm:Regular Rate:Normal + Systolic murmurs ECG: Normal sinus rhythm, rate 67  CATH: LV end diastolic pressure is normal. Severe aortic stenosis (peak to peak gradient 43 mmHg, mean gradient  37.7 mmHg, AVA 1.17 cm2) No angiographic evidence of CAD  ECHO: Aortic valve:   Trileaflet; moderately thickened, moderately calcified leaflets. Valve mobility was moderately restricted. Doppler:   There was moderate to severe stenosis.   There was trivial regurgitation.    VTI ratio of LVOT to aortic valve: 0.26. Valve area (VTI): 0.9 cm^2. Indexed valve area (VTI): 0.59 cm^2/m^2. Peak velocity ratio of LVOT to aortic valve: 0.24. Valve area (Vmax): 0.82 cm^2. Indexed valve area (Vmax): 0.53 cm^2/m^2. Mean velocity ratio of LVOT to aortic valve: 0.23. Valve area (Vmean): 0.8 cm^2. Indexed valve area (Vmean): 0.52 cm^2/m^2. Mean gradient (S): 35 mm Hg. Peak gradient (S): 65 mm Hg.   Neuro/Psych  Headaches, PSYCHIATRIC DISORDERS Anxiety Depression Auditory hallucinations   GI/Hepatic Neg liver ROS, hiatal hernia, GERD  Medicated and Controlled,Gastroparesis   Endo/Other  negative endocrine ROS  Renal/GU negative Renal ROS     Musculoskeletal negative musculoskeletal ROS (+)   Abdominal   Peds  Hematology negative hematology ROS (+)   Anesthesia Other Findings Severe Aortic Stenosis  Reproductive/Obstetrics                            Anesthesia Physical Anesthesia Plan  ASA: IV  Anesthesia  Plan: General   Post-op Pain Management:    Induction: Intravenous  PONV Risk Score and Plan: 3 and Ondansetron, Dexamethasone, Midazolam and Treatment may vary due to age or medical condition  Airway Management Planned: Oral ETT  Additional Equipment: Arterial line, CVP, TEE and Ultrasound Guidance Line Placement  Intra-op Plan:   Post-operative Plan: Extubation in OR  Informed Consent: I have reviewed the patients History and Physical, chart, labs and discussed the procedure including the risks, benefits and alternatives for the proposed anesthesia with the patient or authorized representative who has indicated his/her understanding and acceptance.     Dental advisory given  Plan Discussed with: CRNA  Anesthesia Plan Comments:       Anesthesia Quick Evaluation

## 2018-05-29 NOTE — H&P (Signed)
[address]       [place] [address]             [phone]      Cardiothoracic Surgery Admission History and Physical   Referring Provider is Kathleene Hazel*  PCP is Levora Dredge, MD      Chief Complaint  Patient presents with  . Aortic Stenosis       HPI:   The patient is an 85 year old woman with a history of hypertension, hyperlipidemia, hiatal hernia with GERD/reflux esophagitis/Barrett's esophagus, and aortic stenosis who underwent a laparoscopic Nissen fundoplication in the past by Dr. Wenda Low. She apparently had a failure of the fundoplication and has had recurrent severe reflux causing esophagitis with burning pain in her chest, regurgitation, wheezing and shortness of breath particularly when lying down. She was being evaluated for further surgical treatment of her hiatal hernia and had a preoperative cardiac evaluation. A repeat echocardiogram on 02/24/2018 showed a left ventricular ejection fraction of 60 to 65% with grade 2 diastolic dysfunction. The aortic valve was thickened and calcified with restricted mobility and a mean gradient of 40 mmHg, aortic valve area of 0.9 cm, and dimensionless index of 0.24 consistent with severe aortic stenosis. She was seen by Dr. Excell Seltzer for consideration of TAVR and underwent cardiac catheterization on 04/06/2018 which showed no coronary artery disease. The mean gradient across aortic valve was 38 with a peak to peak gradient of 43 mmHg.   The patient reports 3 episodes of dizziness and loss of balance since November. She has exertional fatigue and tiredness as well as shortness of breath. She has had occasional swelling in her feet. She does report shortness of breath and wheezing with laying down with sounds legs more related to reflux. She lives at home alone and takes care of her house. She is very active for her age.   She was evaluated by Dr. Kristin Bruins as part of her pre-TAVR work-up. She underwent  extraction of 2 teeth as well as alveoloplasty and gross debridement of her remaining dentition. She has recovered well from that.       Past Medical History:  Diagnosis Date  . Anemia 12/14/2012  . Ataxia 06/24/2016  . Auditory hallucinations 08/19/2010   Has has auditory hallucinations, which seem to have worsened since death of her sister Jan 11, 2010. Admission to [facility] (90s or early 2000) for 6 weeks after mother died, also with hallucinations. B/C of severe mental illness and refusal to see pysch, I have refused to refill controlled meds. If she decides to pursue mental health, then she needs to take the initiative and sch the appt bc [name] has s  . Barrett's esophagus    Demonstrated on EGD 2011/01/12. EGD 09/17/2012 shows inflamed GE junctional mucosa without metaplasia, dysplasia, or malignancy.  . Cataract   . Chronic anxiety    Admission to [facility] (90s or early 2000) for 6 weeks after mother died. Complicated again by death of her sister 2010. 2012 developed hallucinations and I refused to refill controlled meds unless she see psych which she is not agreable to  . Chronic insomnia   . Depression   . DVT (deep venous thrombosis) (HCC)   . Gastroparesis    Demonstrated on GES 01/12/11 by Dr Dalene Seltzer  . GERD (gastroesophageal reflux disease)   . H/O hiatal hernia   . HLD (hyperlipidemia) 01/2010  . HTN (hypertension)    Controlled with 2 drug therapy  .  Left leg DVT (HCC) 09/28/2012  . Migraine   . Muscle spasm 09/29/2017  . Severe aortic stenosis         Past Surgical History:  Procedure Laterality Date  . 24 HOUR PH STUDY N/A 08/24/2017   Procedure: 24 HOUR PH STUDY IMPEDANCE; Surgeon: Sherrilyn Rist, MD; Location: [facility] ENDOSCOPY; Service: Gastroenterology; Laterality: N/A;  . ABDOMINAL HYSTERECTOMY    . APPENDECTOMY    . CATARACT EXTRACTION     left eye  . COLONOSCOPY  2000&2005   Dr.Magod  . DIRECT LARYNGOSCOPY  September 2008   preoperative diagnosis hoarseness with anterior right  vocal cord lesion - direct laryngoscopy and excisional biopsy of right anterior vocal cord lesion done by Dr. Ezzard Standing  . ENDOVENOUS ABLATION SAPHENOUS VEIN W/ LASER Left 05-09-2014   EVLA left small saphenous vein by Gretta Began MD  . ESOPHAGEAL MANOMETRY N/A 08/24/2017   Procedure: ESOPHAGEAL MANOMETRY (EM); Surgeon: Sherrilyn Rist, MD; Location: [facility] ENDOSCOPY; Service: Gastroenterology; Laterality: N/A;  . ESOPHAGOGASTRODUODENOSCOPY  11/2010  . ESOPHAGOGASTRODUODENOSCOPY N/A 09/17/2012   Procedure: ESOPHAGOGASTRODUODENOSCOPY (EGD); Surgeon: Hart Carwin, MD; Location: [facility] ENDOSCOPY; Service: Endoscopy; Laterality: N/A;  . ESOPHAGOGASTRODUODENOSCOPY N/A 12/17/2012   Procedure: ESOPHAGOGASTRODUODENOSCOPY (EGD); Surgeon: Beverley Fiedler, MD; Location: [facility] ENDOSCOPY; Service: Gastroenterology; Laterality: N/A;  . EXCISION MASS HEAD N/A 03/05/2016   Procedure: EXCISION POSTERIOR SCALP MASS; Surgeon: Axel Filler, MD; Location: [facility] OR; Service: General; Laterality: N/A;  . EYE SURGERY    . HEMORRHOID SURGERY    . KNEE ARTHROSCOPY    . LAPAROSCOPIC NISSEN FUNDOPLICATION N/A 05/02/2013   Procedure: LAPAROSCOPIC NISSEN FUNDOPLICATION; Surgeon: Valarie Merino, MD; Location: [facility] ORS; Service: General; Laterality: N/A;  . MENISCECTOMY  July 2002   preoperative diagnosis torn medial meniscus right knee, partial medial meniscectomy, debridement chondroplasty patellofemoral joint, done by Dr. Madelon Lips  . POLYPECTOMY  2000   Dr.Magod  . RIGHT/LEFT HEART CATH AND CORONARY ANGIOGRAPHY N/A 04/06/2018   Procedure: RIGHT/LEFT HEART CATH AND CORONARY ANGIOGRAPHY; Surgeon: Kathleene Hazel, MD; Location: [facility] INVASIVE CV LAB; Service: Cardiovascular; Laterality: N/A;  . ROTATOR CUFF REPAIR  09/21/2011   rt shoulder  . TOOTH EXTRACTION Bilateral 04/05/2018   Procedure: Extraction of tooth #'s 6 and 29 with alveoloplasty and gross debridement of remaining teeth; Surgeon: Charlynne Pander, DDS; Location: [facility] OR;  Service: Oral Surgery; Laterality: Bilateral;        Family History  Problem Relation Age of Onset  . Heart disease Mother   . Hypertension Mother   . Heart attack Mother   . Hypertension Father   . Heart attack Father   . Diabetes Brother   . Stroke Neg Hx   . Cancer Neg Hx   . Colon cancer Neg Hx   . Anesthesia problems Neg Hx   . Hypotension Neg Hx   . Malignant hyperthermia Neg Hx   . Pseudochol deficiency Neg Hx   . Stomach cancer Neg Hx   . Rectal cancer Neg Hx   . Esophageal cancer Neg Hx    Social History        Socioeconomic History  . Marital status: Widowed    Spouse name: Not on file  . Number of children: 1  . Years of education: some college  . Highest education level: Not on file  Occupational History  . Occupation: Retired-worked in [employer]  Social Needs  . Financial resource strain: Hard  . Food insecurity:    Worry: Often true    Inability:  Often true  . Transportation needs:    Medical: Yes    Non-medical: No  Tobacco Use  . Smoking status: Former Smoker    Years: 50.00    Types: Cigarettes    Last attempt to quit: 06/02/1983    Years since quitting: 34.9  . Smokeless tobacco: Never Used  Substance and Sexual Activity  . Alcohol use: No    Alcohol/week: 0.0 standard drinks    Comment: quit 24 years ago  . Drug use: No  . Sexual activity: Not Currently    Birth control/protection: Post-menopausal, None  Lifestyle  . Physical activity:    Days per week: Not on file    Minutes per session: Not on file  . Stress: Not on file  Relationships  . Social connections:    Talks on phone: More than three times a week    Gets together: More than three times a week    Attends religious service: More than 4 times per year    Active member of club or organization: Yes    Attends meetings of clubs or organizations: More than 4 times per year    Relationship status: Widowed  . Intimate partner violence:    Fear of current or ex partner: Not on file      Emotionally abused: Not on file    Physically abused: Not on file    Forced sexual activity: Not on file  Other Topics Concern  . 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 The patient nolonger drinks alcohol.   The patient is widowed.   Patient has an adopted daughter living in [place].   Education: some college.          Current Outpatient Medications  Medication Sig Dispense Refill  . amLODipine (NORVASC) 10 MG tablet TAKE 1 TABLET BY MOUTH EVERY DAY (Patient taking differently: Take 10 mg by mouth daily. ) 90 tablet 2  . buPROPion (WELLBUTRIN XL) 150 MG 24 hr tablet Take 1 tablet (150 mg total) by mouth daily. 90 tablet 0  . chlorthalidone (HYGROTON) 25 MG tablet Take 1 tablet (25 mg total) by mouth daily. 90 tablet 0  . CVS MELATONIN 10 MG CAPS TAKE 10 MG BY MOUTH AT BEDTIME. (Patient taking differently: Take 10 mg by mouth at bedtime as needed (sleep). ) 90 capsule 1  . dexlansoprazole (DEXILANT) 60 MG capsule Take 1 capsule (60 mg total) by mouth 2 (two) times daily. (Patient taking differently: Take 60 mg by mouth daily. ) 180 capsule   . hydroxypropyl methylcellulose / hypromellose (ISOPTO TEARS / GONIOVISC) 2.5 % ophthalmic solution Place 1 drop into both eyes daily as needed for dry eyes.    Marland Kitchen PROAIR HFA 108 (90 Base) MCG/ACT inhaler INHALE 2 PUFFS INTO THE LUNGS EVERY 4 (FOUR) HOURS AS NEEDED FOR WHEEZING OR SHORTNESS OF BREATH. (Patient taking differently: Inhale 2 puffs into the lungs every 4 (four) hours as needed for wheezing or shortness of breath. ) 8.5 Inhaler 3  . ranitidine (ZANTAC) 300 MG tablet Take 1 tablet (300 mg total) by mouth at bedtime. 90  tablet 1  .  sertraline (ZOLOFT) 50 MG tablet Take 100 mg by mouth at bedtime.     . sucralfate (CARAFATE) 1 GM/10ML suspension TAKE 10 MLS (1 G TOTAL) BY MOUTH 2 (TWO) TIMES DAILY. 420 mL 1   No current facility-administered medications for this visit.        Allergies  Allergen Reactions  . Aspirin Rash   Review of Systems:   General: + poor appetite, reduced energy, no weight gain, no weight loss, no fever  Cardiac: ni chest pain with exertion, + chest pain at rest, +SOB with moderte exertion, + resting SOB, + PND, + orthopnea, no palpitations, no arrhythmia, no atrial fibrillation, + LE edema, + dizzy spells, no syncope  Respiratory: + shortness of breath, no home oxygen, no productive cough, no dry cough, no bronchitis, + wheezing at night when lying down, no hemoptysis, no asthma, no pain with inspiration or cough, no sleep apnea, no CPAP at night  GI: no difficulty swallowing, + reflux, + frequent heartburn, + hiatal hernia, no abdominal pain, no constipation, no diarrhea, no hematochezia, no hematemesis, no melena  GU: no dysuria, no frequency, no urinary tract infection, no hematuria, no kidney stones, no kidney disease  Vascular: no pain suggestive of claudication, no pain in feet, + leg cramps, + varicose veins, no DVT, no non-healing foot ulcer  Neuro: no stroke, no TIA's, no seizures, + headaches, no temporary blindness one eye, no slurred speech, no peripheral neuropathy, no chronic pain, no instability of gait, no memory/cognitive dysfunction  Musculoskeletal: no arthritis, no joint swelling, no myalgias, no difficulty walking, normal mobility  Skin: no rash, no itching, no skin infections, no pressure sores or ulcerations  Psych: + anxiety, no depression, + nervousness, no unusual recent stress  Eyes: no blurry vision, no floaters, + recent vision changes, + wears glasses  ENT: no hearing loss, no loose or painful teeth, no dentures, last saw dentist 05/11/2018  Hematologic: no  easy bruising, no abnormal bleeding, no clotting disorder, no frequent epistaxis  Endocrine: no diabetes, does not check CBG's at home    Physical Exam:   BP (!) 175/92  Pulse 60  Resp 20  Ht 5' (1.524 m)  Wt 123 lb (55.8 kg)  LMP 04/26/1950  SpO2 96% Comment: RA  BMI 24.02 kg/m  General: Elderly but well-appearing  HEENT: Unremarkable, [place]/AT, PERLA, EOMI, oropharynx clear, multiple missing teeth.  Neck: no JVD, no bruits, no adenopathy or thyromegaly  Chest: clear to auscultation, symmetrical breath sounds, no wheezes, no rhonchi  CV: RRR, grade III/VI crescendo/decrescendo murmur heard best at RSB, no diastolic murmur  Abdomen: soft, non-tender, no masses or organomegaly  Extremities: warm, well-perfused, pulses palpable, no LE edema  Rectal/GU Deferred  Neuro: Grossly non-focal and symmetrical throughout  Skin: Clean and dry, no rashes, no breakdown    Diagnostic Tests:    [facility]  [address]  [place], [place] [address]  [phone]  -------------------------------------------------------------------  Transthoracic Echocardiography  Patient: Manette, Doto  MR #: [id number]  Study Date: 02/24/2018  Gender: F  Age: 61  Height: 160 cm  Weight: 52.6 kg  BSA: 1.53 m^2  Pt. Status:  Room:  ATTENDING Amedeo Kinsman  REFERRING Dyann Kief  SONOGRAPHER Aida Raider, RDCS  PERFORMING [facility], Outpatient  cc:  -------------------------------------------------------------------  LV EF: 60% - 65%  -------------------------------------------------------------------  Indications: R06.09 Dyspnea.  -------------------------------------------------------------------  History: PMH: Acquired from the patient and from the patient&'s  chart.  PMH: Anemia. Chronic anxiety. DVT. Risk factors:  Hypertension.  -------------------------------------------------------------------  Study Conclusions  - Left ventricle: The cavity  size was normal. There was mild  concentric hypertrophy. Systolic function was normal. The  estimated ejection fraction was in the range of 60% to 65%. Wall  motion was normal; there were no regional wall motion  abnormalities. Features are consistent with a pseudonormal left  ventricular filling pattern, with concomitant abnormal relaxation  and increased filling pressure (grade 2 diastolic dysfunction).  - Aortic valve: Valve mobility was moderately restricted. There was  moderate to severe stenosis. There was trivial regurgitation.  Calculated aortic valve area and dimensionless obstructive index  overestimate AS severity due to inaccurate LVOT pulsed wave  sampling location, Even so, the aortic stenosis is approaching  the severe range.  - Pulmonary arteries: Systolic pressure was mildly increased. PA  peak pressure: 36 mm Hg (S).  Impressions:  - Aortic stenosis has worsened since 11/10/2015 and is approaching  severe range.  -------------------------------------------------------------------  Study data: Study status: Routine. Procedure: The patient  reported no pain pre or post test. Transthoracic echocardiography  for left ventricular function evaluation, for right ventricular  function evaluation, and for assessment of valvular function. Image  quality was adequate. Study completion: There were no  complications. Transthoracic echocardiography. M-mode,  complete 2D, spectral Doppler, and color Doppler. Birthdate:  Patient birthdate: January 14, 1933. Age: Patient is 85 yr old. Sex:  Gender: female. BMI: 20.6 kg/m^2. Blood pressure: 112/78  Patient status: Outpatient. Study date: Study date: 02/24/2018.  Study time: 10:32 AM. Location: [place] [facility]  -------------------------------------------------------------------  -------------------------------------------------------------------  Left ventricle: The cavity size was normal. There was mild  concentric hypertrophy. Systolic  function was normal. The estimated  ejection fraction was in the range of 60% to 65%. Wall motion was  normal; there were no regional wall motion abnormalities. Features  are consistent with a pseudonormal left ventricular filling  pattern, with concomitant abnormal relaxation and increased filling  pressure (grade 2 diastolic dysfunction).  -------------------------------------------------------------------  Aortic valve: Trileaflet; moderately thickened, moderately  calcified leaflets. Valve mobility was moderately restricted.  Doppler: There was moderate to severe stenosis. There was  trivial regurgitation. VTI ratio of LVOT to aortic valve: 0.26.  Valve area (VTI): 0.9 cm^2. Indexed valve area (VTI): 0.59  cm^2/m^2. Peak velocity ratio of LVOT to aortic valve: 0.24. Valve  area (Vmax): 0.82 cm^2. Indexed valve area (Vmax): 0.53 cm^2/m^2.  Mean velocity ratio of LVOT to aortic valve: 0.23. Valve area  (Vmean): 0.8 cm^2. Indexed valve area (Vmean): 0.52 cm^2/m^2.  Mean gradient (S): 35 mm Hg. Peak gradient (S): 65 mm Hg.  -------------------------------------------------------------------  Aorta: Aortic root: The aortic root was normal in size.  Ascending aorta: The ascending aorta was normal in size.  -------------------------------------------------------------------  Mitral valve: Mildly thickened leaflets . Leaflet separation was  normal. Doppler: Transvalvular velocity was within the normal  range. There was no evidence for stenosis. There was trivial  regurgitation. Peak gradient (D): 5 mm Hg.  -------------------------------------------------------------------  Left atrium: The atrium was at the upper limits of normal in size.  -------------------------------------------------------------------  Right ventricle: The cavity size was normal. Systolic function was  normal.  -------------------------------------------------------------------  Pulmonic valve: Poorly visualized. The  valve appears to be  grossly normal. Doppler: There was mild regurgitation.  -------------------------------------------------------------------  Tricuspid valve: Structurally normal valve. Leaflet separation  was normal. Doppler: Transvalvular velocity was within the normal  range. There was mild regurgitation.  -------------------------------------------------------------------  Pulmonary artery: Systolic pressure  was mildly increased.  -------------------------------------------------------------------  Right atrium: The atrium was normal in size.  -------------------------------------------------------------------  Pericardium: There was no pericardial effusion.  -------------------------------------------------------------------  Systemic veins:  Inferior vena cava: The vessel was normal in size. The  respirophasic diameter changes were in the normal range (>= 50%),  consistent with normal central venous pressure.  -------------------------------------------------------------------  Measurements  Left ventricle Value Reference  LV ID, ED, PLAX chordal (L) 38 mm 43 - 52  LV ID, ES, PLAX chordal 24 mm 23 - 38  LV fx shortening, PLAX chordal 37 % >=29  LV PW thickness, ED 12 mm ----------  IVS/LV PW ratio, ED 1.06 <=1.3  Stroke volume, 2D 71 ml ----------  Stroke volume/bsa, 2D 46 ml/m^2 ----------  LV e&', lateral 6.85 cm/s ----------  LV E/e&', lateral 16.06 ----------  LV e&', medial 6.96 cm/s ----------  LV E/e&', medial 15.8 ----------  LV e&', average 6.91 cm/s ----------  LV E/e&', average 15.93 ----------  Ventricular septum Value Reference  IVS thickness, ED 12.69 mm ----------  LVOT Value Reference  LVOT ID, S 21 mm ----------  LVOT area 3.46 cm^2 ----------  LVOT ID 19 mm ----------  LVOT peak velocity, S 94.9 cm/s ----------  LVOT mean velocity, S 64.5 cm/s ----------  LVOT VTI, S 25 cm ----------  Stroke volume (SV), LVOT DP 86.6 ml ----------  Stroke index  (SV/bsa), LVOT DP 56.6 ml/m^2 ----------  Aortic valve Value Reference  Aortic valve peak velocity, S 402 cm/s ----------  Aortic valve mean velocity, S 279 cm/s ----------  Aortic valve VTI, S 96.7 cm ----------  Aortic mean gradient, S 35 mm Hg ----------  Aortic peak gradient, S 65 mm Hg ----------  VTI ratio, LVOT/AV 0.26 ----------  Aortic valve area, VTI 0.9 cm^2 ----------  Aortic valve area/bsa, VTI 0.59 cm^2/m^2 ----------  Velocity ratio, peak, LVOT/AV 0.24 ----------  Aortic valve area, peak velocity 0.82 cm^2 ----------  Aortic valve area/bsa, peak 0.53 cm^2/m^2 ----------  velocity  Velocity ratio, mean, LVOT/AV 0.23 ----------  Aortic valve area, mean velocity 0.8 cm^2 ----------  Aortic valve area/bsa, mean 0.52 cm^2/m^2 ----------  velocity  Aorta Value Reference  Aortic root ID, ED 29 mm ----------  Ascending aorta ID, A-P, S 24 mm ----------  Left atrium Value Reference  LA ID, A-P, ES 37 mm ----------  LA ID/bsa, A-P (H) 2.42 cm/m^2 <=2.2  LA volume, S 53.3 ml ----------  LA volume/bsa, S 34.9 ml/m^2 ----------  LA volume, ES, 1-p A4C 49 ml ----------  LA volume/bsa, ES, 1-p A4C 32.1 ml/m^2 ----------  LA volume, ES, 1-p A2C 53.6 ml ----------  LA volume/bsa, ES, 1-p A2C 35.1 ml/m^2 ----------  Mitral valve Value Reference  Mitral E-wave peak velocity 110 cm/s ----------  Mitral A-wave peak velocity 109 cm/s ----------  Mitral deceleration time 218 ms 150 - 230  Mitral peak gradient, D 5 mm Hg ----------  Mitral E/A ratio, peak 1 ----------  Pulmonary arteries Value Reference  PA pressure, S, DP (H) 36 mm Hg <=30  Tricuspid valve Value Reference  Tricuspid regurg peak velocity 287 cm/s ----------  Tricuspid peak RV-RA gradient 33 mm Hg ----------  Right atrium Value Reference  RA ID, S-I, ES, A4C 35.1 mm 34 - 49  RA area, ES, A4C 10.7 cm^2 8.3 - 19.5  RA volume, ES, A/L 27 ml ----------  RA volume/bsa, ES, A/L 17.7 ml/m^2 ----------  Systemic veins  Value Reference  Estimated CVP 3 mm Hg ----------  Right ventricle Value  Reference  RV ID, minor axis, ED, A4C base 36 mm ----------  TAPSE 21.9 mm ----------  RV pressure, S, DP (H) 36 mm Hg <=30  RV s&', lateral, S 16.4 cm/s ----------  Pulmonic valve Value Reference  Pulmonic regurg velocity, ED 104 cm/s ----------  Legend:  (L) and (H) mark values outside specified reference range.  -------------------------------------------------------------------  Prepared and Electronically Authenticated by  Thurmon Fair, MD  2019-11-01T14:06:38     Physicians  Panel Physicians Referring Physician Case Authorizing Physician  Kathleene Hazel, MD (Primary)    Procedures  RIGHT/LEFT HEART CATH AND CORONARY ANGIOGRAPHY  Conclusion  LV end diastolic pressure is normal.  Severe aortic stenosis (peak to peak gradient 43 mmHg, mean gradient 37.7 mmHg, AVA 1.17 cm2)  No angiographic evidence of CAD Will proceed with planning for TAVR. Pt will remain in the hospital tonight post cath. She has no family or friends who are able to stay with her at home tonight.   Indications  Severe aortic stenosis [I35.0 (ICD-10-CM)]  Procedural Details  Technical Details Indication: 85 yo female with anemia, severe aortic stenosis here today for cardiac cath/TAVR workup.  Procedure: The risks, benefits, complications, treatment options, and expected outcomes were discussed with the patient. The patient and/or family concurred with the proposed plan, giving informed consent. The patient was brought to the cath lab after IV hydration was given. The patient was further sedated with Versed and Fentanyl. There was an IV catheter present in the right antecubital vein. This area was prepped and draped in a sterile fashion. The IV catheter was changed for a 5 French sheath. Right heart catheterization performed with a balloon tipped catheter. The right wrist was prepped and draped in a sterile fashion. 1% lidocaine  was used for local anesthesia. Using the modified Seldinger access technique, a 5 French sheath was placed in the right radial artery. 3 mg Verapamil was given through the sheath. 3000 units IV heparin was given. Standard diagnostic catheters were used to perform selective coronary angiography. I crossed the aortic valve a JR4 catheter. LV pressures recorded. No LV gram performed. The sheath was removed from the right radial artery and a Terumo hemostasis band was applied at the arteriotomy site on the right wrist.    Estimated blood loss <50 mL.   During this procedure medications were administered to achieve and maintain moderate conscious sedation while the patient's heart rate, blood pressure, and oxygen saturation were continuously monitored and I was present face-to-face 100% of this time.  Medications  (Filter: Administrations occurring from 04/06/18 1200 to 04/06/18 1259)          Medication Rate/Dose/Volume Action  Date Time   Heparin (Porcine) in NaCl 1000-0.9 UT/500ML-% SOLN (mL) 500 mL Given 04/06/18 1204   Total dose as of 05/11/18 1655        500 mL        Heparin (Porcine) in NaCl 1000-0.9 UT/500ML-% SOLN (mL) 500 mL Given 04/06/18 1204   Total dose as of 05/11/18 1655        500 mL        fentaNYL (SUBLIMAZE) injection (mcg) 25 mcg Given 04/06/18 1211   Total dose as of 05/11/18 1655 25 mcg Given 1228   50 mcg        midazolam (VERSED) injection (mg) 1 mg Given 04/06/18 1211   Total dose as of 05/11/18 1655 1 mg Given 1228   2 mg        lidocaine (PF) (  XYLOCAINE) 1 % injection (mL) 2 mL Given 04/06/18 1229   Total dose as of 05/11/18 1655 2 mL Given 1235   4 mL        Radial Cocktail/Verapamil only (mL)  Given 04/06/18 1235   Total dose as of 05/11/18 1655        Cannot be calculated        iohexol (OMNIPAQUE) 350 MG/ML injection (mL) 40 mL Given 04/06/18 1251   Total dose as of 05/11/18 1655        40 mL        heparin injection (Units) 3,000 Units Given 04/06/18 1238    Total dose as of 05/11/18 1655        3,000 Units        amLODipine (NORVASC) tablet 10 mg (mg) *Not included in total MAR Hold 04/06/18 1200   Total dose as of 05/11/18 1655        Cannot be calculated        buPROPion (WELLBUTRIN XL) 24 hr tablet 150 mg (mg) *Not included in total MAR Hold 04/06/18 1200   Dosing weight: 53.5 kg        Total dose as of 05/11/18 1655        Cannot be calculated        chlorhexidine (PERIDEX) 0.12 % solution 15 mL (mL) *Not included in total MAR Hold 04/06/18 1200   Dosing weight: 53.6 kg        Total dose as of 05/11/18 1655        Cannot be calculated        chlorthalidone (HYGROTON) tablet 25 mg (mg) *Not included in total MAR Hold 04/06/18 1200   Dosing weight: 53.5 kg        Total dose as of 05/11/18 1655        Cannot be calculated        feeding supplement (BOOST / RESOURCE BREEZE) liquid 1 Container (Container) *Not included in total [facility] Hold 04/06/18 1200   Dosing weight: 54.4 kg        Total dose as of 05/11/18 1655        Cannot be calculated        heparin injection 5,000 Units (Units) *Not included in total MAR Hold 04/06/18 1200   Dosing weight: 53.5 kg        Total dose as of 05/11/18 1655        Cannot be calculated        hydrALAZINE (APRESOLINE) injection 10 mg (mg) *Not included in total MAR Hold 04/06/18 1200   Dosing weight: 54.4 kg        Total dose as of 05/11/18 1655        Cannot be calculated        Melatonin TABS 9 mg (mg) *Not included in total MAR Hold 04/06/18 1200   Dosing weight: 53.5 kg        Total dose as of 05/11/18 1655        Cannot be calculated        nitroGLYCERIN (NITROSTAT) SL tablet 0.4 mg (mg) *Not included in total MAR Hold 04/06/18 1200   Dosing weight: 53.5 kg        Total dose as of 05/11/18 1655        Cannot be calculated        ondansetron (ZOFRAN) injection 4 mg (mg) *Not included in total MAR Hold 04/06/18 1200   Dosing weight: 53.5  kg        Total dose as of 05/11/18 1655        Cannot be  calculated        oxyCODONE-acetaminophen (PERCOCET/ROXICET) 5-325 MG per tablet 1 tablet (tablet) *Not included in total MAR Hold 04/06/18 1200   Dosing weight: 53.5 kg        Total dose as of 05/11/18 1655        Cannot be calculated        pantoprazole (PROTONIX) EC tablet 40 mg (mg) *Not included in total MAR Hold 04/06/18 1200   Dosing weight: 53.5 kg        Total dose as of 05/11/18 1655        Cannot be calculated        polyethylene glycol (MIRALAX / GLYCOLAX) packet 17 g (g) *Not included in total MAR Hold 04/06/18 1200   Dosing weight: 53.5 kg        Total dose as of 05/11/18 1655        Cannot be calculated        polyvinyl alcohol (LIQUIFILM TEARS) 1.4 % ophthalmic solution 1 drop (drop) *Not included in total MAR Hold 04/06/18 1200   Dosing weight: 53.5 kg        Total dose as of 05/11/18 1655        Cannot be calculated        sertraline (ZOLOFT) tablet 100 mg (mg) *Not included in total MAR Hold 04/06/18 1200   Total dose as of 05/11/18 1655        Cannot be calculated        sodium chloride irrigation 0.9 % 200 mL (mL) *Not included in total MAR Hold 04/06/18 1200   Dosing weight: 53.6 kg        Total dose as of 05/11/18 1655        Cannot be calculated        sucralfate (CARAFATE) 1 GM/10ML suspension 1 g (g) *Not included in total MAR Hold 04/06/18 1200   Total dose as of 05/11/18 1655        Cannot be calculated        Sedation Time  Sedation Time Physician-1: 34 minutes 2 seconds  Complications  Complications documented before study signed (04/06/2018 1:01 PM EST)   RIGHT/LEFT HEART CATH AND CORONARY ANGIOGRAPHY   None Documented by Kathleene Hazel, MD 04/06/2018 12:55 PM EST  Time Range: Intraprocedure    Coronary Findings  Diagnostic  Dominance: Right  Left Anterior Descending  Vessel is large.  Left Circumflex  Vessel is large.  Right Coronary Artery  Vessel is large.  Intervention  No interventions have been documented.  Right Heart    Right Heart Pressures LV EDP is normal.  Coronary Diagrams  Diagnostic  Dominance: Right   Intervention  Implants  No implant documentation for this case.  Syngo Images     Show images for CARDIAC CATHETERIZATION  MERGE Images  Link to Procedure Log   Show images for CARDIAC CATHETERIZATION Procedure Log  Hemo Data   Most Recent Value  Fick Cardiac Output 5.58 L/min  Fick Cardiac Output Index 3.73 (L/min)/BSA  Aortic Mean Gradient 37.7 mmHg  Aortic Peak Gradient 43 mmHg  Aortic Valve Area 1.17  Aortic Value Area Index 0.78 cm2/BSA  RA A Wave 8 mmHg  RA V Wave 4 mmHg  RA Mean 4 mmHg  RV Systolic Pressure 28 mmHg  RV Diastolic Pressure 2 mmHg  RV EDP 5 mmHg  PA Systolic Pressure 21 mmHg  PA Diastolic Pressure 2 mmHg  PA Mean 7 mmHg  PW A Wave 9 mmHg  PW V Wave 6 mmHg  PW Mean 5 mmHg  AO Systolic Pressure 100 mmHg  AO Diastolic Pressure 57 mmHg  AO Mean 74 mmHg  LV Systolic Pressure 144 mmHg  LV Diastolic Pressure 8 mmHg  LV EDP 12 mmHg  AOp Systolic Pressure 98 mmHg  AOp Diastolic Pressure 52 mmHg  AOp Mean Pressure 69 mmHg  LVp Systolic Pressure 141 mmHg  LVp Diastolic Pressure 9 mmHg  LVp EDP Pressure 12 mmHg  QP/QS 1  TPVR Index 1.88 HRUI  TSVR Index 19.85 HRUI  PVR SVR Ratio 0.03  TPVR/TSVR Ratio 0.09      ADDENDUM REPORT: 05/02/2018 14:54  CLINICAL DATA: 85 year old female with severe aortic stenosis being  evaluated for a TAVR procedure.  EXAM:  Cardiac TAVR CT  TECHNIQUE:  The patient was scanned on a [employer]. A 120 kV  retrospective scan was triggered in the descending thoracic aorta at  111 HU's. Gantry rotation speed was 250 msecs and collimation was .6  mm. No beta blockade or nitro were given. The 3D data set was  reconstructed in 5% intervals of the R-R cycle. Systolic and  diastolic phases were analyzed on a dedicated work station using  MPR, MIP and VRT modes. The patient received 80 cc of contrast.  FINDINGS:  Aortic  Valve: Trileaflet aortic valve with severely thickened and  only mildly calcified leaflets. Leaflets opening is moderately  restricted.  Aorta: Normal size with mild diffuse atherosclerosis and  calcifications and no dissection.  Sinotubular Junction: 24 x 24 mm  Ascending Thoracic Aorta: 27 x 27 mm  Aortic Arch: 24 x 23 mm  Descending Thoracic Aorta: 22 x 22 mm  Sinus of Valsalva Measurements:  Non-coronary: 27 mm  Right -coronary: 25 mm  Left -coronary: 26 mm  Sinus of Valsalva Height:  Right -coronary: 18 mm  Left -coronary: 20 mm  Left leaflet length: 12 mm  Right leaflet length: 12 mm  Coronary Artery Height above Annulus:  Left Main: 13 mm  Right Coronary: 15 mm  Virtual Basal Annulus Measurements:  Maximum/Minimum Diameter: 23.0 x 21.3 mm  Mean Diameter: 21.2 mm  Perimeter: 68.4 mm  Area: 354 mm2  Optimum Fluoroscopic Angle for Delivery: LAO 6 CAU 6  IMPRESSION:  1. Trileaflet aortic valve with severely thickened and only mildly  calcified leaflets. Given only mild to moderate leaflet opening  restriction but the fact that the annulus size is small this patient  might benefit best from a 26 mm Evolut Pro Medtronic valve.  2. Sufficient coronary to annulus distance.  3. Optimum Fluoroscopic Angle for Delivery: LAO 6 CAU 6.  4. No thrombus in the left atrial appendage.  Electronically Signed  By: Tobias Alexander  On: 05/02/2018 14:54   Addended by Lars Masson, MD on 05/02/2018 2:57 PM  Study Result   EXAM:  OVER-READ INTERPRETATION CT CHEST  The following report is an over-read performed by radiologist Dr.  Trudie Reed of [facility] on 05/01/2018. This  over-read does not include interpretation of cardiac or coronary  anatomy or pathology. The coronary CTA interpretation by the  cardiologist is attached.  COMPARISON: Chest CT 07/08/2015.  FINDINGS:  Extracardiac findings will be described separately under dictation  for contemporaneously  obtained CTA chest, abdomen and pelvis.  IMPRESSION:  1. Please see  separate dictation for contemporaneously obtained CTA  chest, abdomen and pelvis for full description of relevant  extracardiac findings.  Electronically Signed:  By: Trudie Reed M.D.  On: 05/01/2018 10:47      CLINICAL DATA: 85 year old female with history of severe aortic  stenosis. Preprocedural study prior to potential transcatheter  aortic valve replacement (TAVR) procedure.  EXAM:  CTA CHEST ABDOMEN AND PELVIS WITH CONTRAST  TECHNIQUE:  Multidetector CT imaging of the chest, abdomen and pelvis was  performed using the standard protocol during bolus administration of  intravenous contrast. Multiplanar reconstructed images and MIPs were  obtained and reviewed to evaluate the vascular anatomy.  CONTRAST: ISOVUE-370 IOPAMIDOL (ISOVUE-370) INJECTION 76%  COMPARISON: Chest CT 07/08/2015. CT the abdomen and pelvis  05/14/2008.  FINDINGS:  CTA CHEST FINDINGS  Cardiovascular: Heart size is normal. There is no significant  pericardial fluid, thickening or pericardial calcification. Aortic  atherosclerosis. No definite coronary artery calcifications.  Thickening and calcification of the aortic valve.  Mediastinum/Lymph Nodes: No pathologically enlarged mediastinal or  hilar lymph nodes. Moderate-sized hiatal hernia. No axillary  lymphadenopathy.  Lungs/Pleura: No suspicious appearing pulmonary nodules or masses  are noted. No acute consolidative airspace disease. No pleural  effusions.  Musculoskeletal/Soft Tissues: There are no aggressive appearing  lytic or blastic lesions noted in the visualized portions of the  skeleton.  CTA ABDOMEN AND PELVIS FINDINGS  Hepatobiliary: 1.6 cm intermediate attenuation (37 HU) lesion in  segment 2 of the liver, incompletely characterized but only slightly  larger than remote prior study from 2010, favored to represent a  benign lesion. No other larger more suspicious  appearing hepatic  lesions. No intra or extrahepatic biliary ductal dilatation.  Gallbladder is normal in appearance.  Pancreas: No pancreatic mass. No pancreatic ductal dilatation. No  pancreatic or peripancreatic fluid or inflammatory changes.  Spleen: Unremarkable.  Adrenals/Urinary Tract: Bilateral kidneys and bilateral adrenal  glands are normal in appearance. No hydroureteronephrosis. Urinary  bladder is normal in appearance.  Stomach/Bowel: Normal appearance of the intra-abdominal portion of  the stomach. No pathologic dilatation of small bowel or colon.  Numerous colonic diverticulae are noted, without surrounding  inflammatory changes to suggest an acute diverticulitis at this  time. The appendix is not confidently identified and may be  surgically absent. Regardless, there are no inflammatory changes  noted adjacent to the cecum to suggest the presence of an acute  appendicitis at this time.  Vascular/Lymphatic: Aortic atherosclerosis, without evidence of  aneurysm or dissection in the abdominal or pelvic vasculature.  Vascular findings and measurements pertinent to potential TAVR  procedure, as detailed below. No lymphadenopathy noted in the  abdomen or pelvis.  Reproductive: Status post hysterectomy. Ovaries are unremarkable in  appearance.  Other: No significant volume of ascites. No pneumoperitoneum.  Musculoskeletal: There are no aggressive appearing lytic or blastic  lesions noted in the visualized portions of the skeleton.  VASCULAR MEASUREMENTS PERTINENT TO TAVR:  AORTA:  Minimal Aortic Diameter-15 x 17 mm  Severity of Aortic Calcification-mild  RIGHT PELVIS:  Right Common Iliac Artery -  Minimal Diameter-8.1 x 8.1 mm  Tortuosity-mild  Calcification-mild  Right External Iliac Artery -  Minimal Diameter-6.8 x 6.4 mm  Tortuosity-moderate to severe  Calcification-mild  Right Common Femoral Artery -  Minimal Diameter-6.1 x 7.3 mm  Tortuosity-mild    Calcification-mild  LEFT PELVIS:  Left Common Iliac Artery -  Minimal Diameter-8.0 x 7.3 mm  Tortuosity-mild  Calcification-mild  Left External Iliac Artery -  Minimal Diameter-6.9 x  7.0 mm  Tortuosity-moderate  Calcification-none  Left Common Femoral Artery -  Minimal Diameter-6.6 x 7.4 mm  Tortuosity-mild  Calcification-mild  Review of the MIP images confirms the above findings.  IMPRESSION:  1. Vascular findings and measurements pertinent to potential TAVR  procedure, as detailed above.  2. Severe thickening calcification of the aortic valve, compatible  with the reported clinical history of severe aortic stenosis.  3. Moderate-sized hiatal hernia.  4. 1.6 cm indeterminate lesion in segment 2 of the liver, only  slightly larger than remote prior study from 2010, favored to be  benign (likely a mildly proteinaceous cyst).  5. Colonic diverticulosis without evidence of acute diverticulitis  at this time.  6. Additional incidental findings, as above.  Electronically Signed  By: Trudie Reed M.D.  On: 05/01/2018 12:24     STS Risk Score:   Procedure: Isolated AVR  Risk of Mortality: 3.284%  Renal Failure: 2.193%  Permanent Stroke: 2.373%  Prolonged Ventilation: 10.341%  DSW Infection: 0.037%  Reoperation: 5.003%  Morbidity or Mortality: 16.527%  Short Length of Stay: 23.290%  Long Length of Stay: 7.561%    Impression:   This 85 year old woman has stage D, severe, symptomatic aortic stenosis with New York Heart Association class II-III symptoms of exertional shortness of breath and fatigue as well as 3 prior episodes of dizziness and presyncope. She also has shortness of breath, wheezing, and burning chest pain when lying down at night which are most likely related to her severe reflux with esophagitis. I have personally reviewed her 2D echocardiogram, cardiac catheterization, and CTA studies. Echocardiogram from 02/24/2018 shows a trileaflet aortic valve with  moderately thickened and calcified leaflets with restricted mobility. The mean gradient across aortic valve was 35 mmHg with a dimensionless index of 0.24 and aortic valve area of 0.8 cm consistent with severe aortic stenosis. Left ventricular systolic function is normal with grade 2 diastolic dysfunction. Her cardiac catheterization shows no angiographic evidence of coronary disease. The mean gradient was 38 mmHg and the peak to peak gradient was 43 mmHg consistent with severe aortic stenosis. I agree that aortic valve replacement is indicated in this patient for relief of her symptoms and to prevent progressive left ventricular deterioration. This will also allow her to safely undergo further surgery for her hiatal hernia and reflux which is very symptomatic. I think transcatheter aortic valve replacement would be the best treatment for this 85 year old woman. Her gated cardiac CTA shows anatomy favorable for transcatheter aortic valve replacement. She has a relatively small annulus and would likely benefit from using a Medtronic Evolut valve. Her CTA of the abdomen and pelvis shows adequate pelvic vascular anatomy to allow transfemoral insertion by an 18 French sheath if a Medtronic valve is used.   The patient was counseled at length regarding treatment alternatives for management of severe symptomatic aortic stenosis. The risks and benefits of surgical intervention has been discussed in detail. Long-term prognosis with medical therapy was discussed. Alternative approaches such as conventional surgical aortic valve replacement, transcatheter aortic valve replacement, and palliative medical therapy were compared and contrasted at length. This discussion was placed in the context of the patient's own specific clinical presentation and past medical history. All of her questions have been addressed.   Following the decision to proceed with transcatheter aortic valve replacement, a discussion was held regarding  what types of management strategies would be attempted intraoperatively in the event of life-threatening complications, including whether or not the patient would be considered a candidate for  the use of cardiopulmonary bypass and/or conversion to open sternotomy for attempted surgical intervention. She is 85 years old but active and has no coronary disease, normal LV systolic function and is a low risk surgical patient so I would consider her a candidate for sternotomy if needed to address intraoperative complications. The patient is aware of the fact that transient use of cardiopulmonary bypass may be necessary.The patient has been advised of a variety of complications that might develop including but not limited to risks of death, stroke, paravalvular leak, aortic dissection or other major vascular complications, aortic annulus rupture, device embolization, cardiac rupture or perforation, mitral regurgitation, acute myocardial infarction, arrhythmia, heart block or bradycardia requiring permanent pacemaker placement, congestive heart failure, respiratory failure, renal failure, pneumonia, infection, other late complications related to structural valve deterioration or migration, or other complications that might ultimately cause a temporary or permanent loss of functional independence or other long term morbidity. The patient provides full informed consent for the procedure as described and all questions were answered.   Plan:   Transfemoral transcatheter aortic valve replacement  with a Medtronic Evolut valve.    Alleen Borne, MD

## 2018-05-30 ENCOUNTER — Encounter (HOSPITAL_COMMUNITY)
Admission: RE | Disposition: A | Discharge status: Home Health | Payer: Self-pay | Source: Home / Self Care | Attending: Surgery

## 2018-05-30 ENCOUNTER — Inpatient Hospital Stay (HOSPITAL_COMMUNITY): Admit: 2018-05-30 | Payer: Medicare Other | Admitting: Cardiovascular Disease

## 2018-05-30 ENCOUNTER — Inpatient Hospital Stay (HOSPITAL_COMMUNITY)
Admission: RE | Admit: 2018-05-30 | Discharge: 2018-06-02 | DRG: 267 | Disposition: A | Discharge status: Home Health | Payer: Medicare Other | Attending: Surgery | Admitting: Surgery

## 2018-05-30 ENCOUNTER — Other Ambulatory Visit: Payer: Self-pay | Admitting: Internal Medicine

## 2018-05-30 ENCOUNTER — Inpatient Hospital Stay (HOSPITAL_COMMUNITY): Payer: Medicare Other

## 2018-05-30 ENCOUNTER — Encounter (HOSPITAL_COMMUNITY): Payer: Self-pay | Admitting: Physician Assistant

## 2018-05-30 ENCOUNTER — Other Ambulatory Visit: Payer: Self-pay

## 2018-05-30 ENCOUNTER — Encounter (HOSPITAL_COMMUNITY): Payer: Self-pay

## 2018-05-30 ENCOUNTER — Inpatient Hospital Stay (HOSPITAL_COMMUNITY): Payer: Medicare Other | Admitting: Physician Assistant

## 2018-05-30 ENCOUNTER — Other Ambulatory Visit: Payer: Self-pay | Admitting: Physician Assistant

## 2018-05-30 DIAGNOSIS — M7989 Other specified soft tissue disorders: Secondary | ICD-10-CM | POA: Diagnosis present

## 2018-05-30 DIAGNOSIS — Z006 Encounter for examination for normal comparison and control in clinical research program: Secondary | ICD-10-CM

## 2018-05-30 DIAGNOSIS — K219 Gastro-esophageal reflux disease without esophagitis: Secondary | ICD-10-CM | POA: Diagnosis present

## 2018-05-30 DIAGNOSIS — Z954 Presence of other heart-valve replacement: Secondary | ICD-10-CM | POA: Diagnosis not present

## 2018-05-30 DIAGNOSIS — J42 Unspecified chronic bronchitis: Secondary | ICD-10-CM | POA: Diagnosis present

## 2018-05-30 DIAGNOSIS — M62830 Muscle spasm of back: Secondary | ICD-10-CM | POA: Diagnosis present

## 2018-05-30 DIAGNOSIS — Z9842 Cataract extraction status, left eye: Secondary | ICD-10-CM

## 2018-05-30 DIAGNOSIS — I361 Nonrheumatic tricuspid (valve) insufficiency: Secondary | ICD-10-CM | POA: Diagnosis not present

## 2018-05-30 DIAGNOSIS — Z86718 Personal history of other venous thrombosis and embolism: Secondary | ICD-10-CM

## 2018-05-30 DIAGNOSIS — R0609 Other forms of dyspnea: Secondary | ICD-10-CM | POA: Diagnosis present

## 2018-05-30 DIAGNOSIS — Z952 Presence of prosthetic heart valve: Secondary | ICD-10-CM

## 2018-05-30 DIAGNOSIS — F329 Major depressive disorder, single episode, unspecified: Secondary | ICD-10-CM

## 2018-05-30 DIAGNOSIS — R42 Dizziness and giddiness: Secondary | ICD-10-CM | POA: Diagnosis present

## 2018-05-30 DIAGNOSIS — I10 Essential (primary) hypertension: Secondary | ICD-10-CM | POA: Diagnosis present

## 2018-05-30 DIAGNOSIS — R51 Headache: Secondary | ICD-10-CM | POA: Diagnosis present

## 2018-05-30 DIAGNOSIS — K2270 Barrett's esophagus without dysplasia: Secondary | ICD-10-CM | POA: Diagnosis present

## 2018-05-30 DIAGNOSIS — K210 Gastro-esophageal reflux disease with esophagitis: Secondary | ICD-10-CM | POA: Diagnosis present

## 2018-05-30 DIAGNOSIS — R05 Cough: Secondary | ICD-10-CM | POA: Diagnosis present

## 2018-05-30 DIAGNOSIS — Z886 Allergy status to analgesic agent status: Secondary | ICD-10-CM

## 2018-05-30 DIAGNOSIS — I447 Left bundle-branch block, unspecified: Secondary | ICD-10-CM | POA: Diagnosis present

## 2018-05-30 DIAGNOSIS — R062 Wheezing: Secondary | ICD-10-CM | POA: Diagnosis present

## 2018-05-30 DIAGNOSIS — K5730 Diverticulosis of large intestine without perforation or abscess without bleeding: Secondary | ICD-10-CM | POA: Diagnosis present

## 2018-05-30 DIAGNOSIS — R55 Syncope and collapse: Secondary | ICD-10-CM | POA: Diagnosis present

## 2018-05-30 DIAGNOSIS — K449 Diaphragmatic hernia without obstruction or gangrene: Secondary | ICD-10-CM | POA: Diagnosis present

## 2018-05-30 DIAGNOSIS — E785 Hyperlipidemia, unspecified: Secondary | ICD-10-CM | POA: Diagnosis present

## 2018-05-30 DIAGNOSIS — R0602 Shortness of breath: Secondary | ICD-10-CM | POA: Diagnosis present

## 2018-05-30 DIAGNOSIS — D500 Iron deficiency anemia secondary to blood loss (chronic): Secondary | ICD-10-CM | POA: Diagnosis present

## 2018-05-30 DIAGNOSIS — I350 Nonrheumatic aortic (valve) stenosis: Principal | ICD-10-CM | POA: Diagnosis present

## 2018-05-30 DIAGNOSIS — Z7951 Long term (current) use of inhaled steroids: Secondary | ICD-10-CM

## 2018-05-30 DIAGNOSIS — I340 Nonrheumatic mitral (valve) insufficiency: Secondary | ICD-10-CM | POA: Diagnosis not present

## 2018-05-30 DIAGNOSIS — Z90710 Acquired absence of both cervix and uterus: Secondary | ICD-10-CM

## 2018-05-30 DIAGNOSIS — R490 Dysphonia: Secondary | ICD-10-CM | POA: Diagnosis present

## 2018-05-30 DIAGNOSIS — R5383 Other fatigue: Secondary | ICD-10-CM | POA: Diagnosis present

## 2018-05-30 DIAGNOSIS — F5104 Psychophysiologic insomnia: Secondary | ICD-10-CM | POA: Diagnosis present

## 2018-05-30 DIAGNOSIS — Z8249 Family history of ischemic heart disease and other diseases of the circulatory system: Secondary | ICD-10-CM

## 2018-05-30 DIAGNOSIS — Z87891 Personal history of nicotine dependence: Secondary | ICD-10-CM

## 2018-05-30 DIAGNOSIS — F419 Anxiety disorder, unspecified: Secondary | ICD-10-CM | POA: Diagnosis present

## 2018-05-30 DIAGNOSIS — Z9889 Other specified postprocedural states: Secondary | ICD-10-CM | POA: Diagnosis present

## 2018-05-30 DIAGNOSIS — Z79899 Other long term (current) drug therapy: Secondary | ICD-10-CM

## 2018-05-30 DIAGNOSIS — Z833 Family history of diabetes mellitus: Secondary | ICD-10-CM

## 2018-05-30 HISTORY — DX: Presence of prosthetic heart valve: Z95.2

## 2018-05-30 HISTORY — PX: TRANSCATHETER AORTIC VALVE REPLACEMENT, TRANSFEMORAL: SHX6400

## 2018-05-30 HISTORY — PX: TEE WITHOUT CARDIOVERSION: SHX5443

## 2018-05-30 LAB — POCT I-STAT 7, (LYTES, BLD GAS, ICA,H+H)
Acid-base deficit: 2 mmol/L (ref 0.0–2.0)
BICARBONATE: 24.7 mmol/L (ref 20.0–28.0)
Bicarbonate: 26.1 mmol/L (ref 20.0–28.0)
Calcium, Ion: 1.24 mmol/L (ref 1.15–1.40)
Calcium, Ion: 1.19 mmol/L (ref 1.15–1.40)
HCT: 32 % — ABNORMAL LOW (ref 36.0–46.0)
HCT: 32 % — ABNORMAL LOW (ref 36.0–46.0)
Hemoglobin: 10.9 g/dL — ABNORMAL LOW (ref 12.0–15.0)
Hemoglobin: 10.9 g/dL — ABNORMAL LOW (ref 12.0–15.0)
O2 Saturation: 100 %
O2 Saturation: 99 %
Potassium: 3.9 mmol/L (ref 3.5–5.1)
Potassium: 3.8 mmol/L (ref 3.5–5.1)
Sodium: 136 mmol/L (ref 135–145)
Sodium: 137 mmol/L (ref 135–145)
TCO2: 28 mmol/L (ref 22–32)
TCO2: 26 mmol/L (ref 22–32)
pCO2 arterial: 46.2 mmHg (ref 32.0–48.0)
pCO2 arterial: 47.9 mmHg (ref 32.0–48.0)
pH, Arterial: 7.36 (ref 7.350–7.450)
pH, Arterial: 7.322 — ABNORMAL LOW (ref 7.350–7.450)
pO2, Arterial: 341 mmHg — ABNORMAL HIGH (ref 83.0–108.0)
pO2, Arterial: 160 mmHg — ABNORMAL HIGH (ref 83.0–108.0)

## 2018-05-30 LAB — POCT I-STAT 4, (NA,K, GLUC, HGB,HCT)
Glucose, Bld: 129 mg/dL — ABNORMAL HIGH (ref 70–99)
Glucose, Bld: 108 mg/dL — ABNORMAL HIGH (ref 70–99)
Glucose, Bld: 131 mg/dL — ABNORMAL HIGH (ref 70–99)
HCT: 33 % — ABNORMAL LOW (ref 36.0–46.0)
HCT: 29 % — ABNORMAL LOW (ref 36.0–46.0)
HEMATOCRIT: 28 % — AB (ref 36.0–46.0)
Hemoglobin: 11.2 g/dL — ABNORMAL LOW (ref 12.0–15.0)
Hemoglobin: 9.5 g/dL — ABNORMAL LOW (ref 12.0–15.0)
Hemoglobin: 9.9 g/dL — ABNORMAL LOW (ref 12.0–15.0)
Potassium: 3.9 mmol/L (ref 3.5–5.1)
Potassium: 3.6 mmol/L (ref 3.5–5.1)
Potassium: 3.7 mmol/L (ref 3.5–5.1)
Sodium: 137 mmol/L (ref 135–145)
Sodium: 136 mmol/L (ref 135–145)
Sodium: 137 mmol/L (ref 135–145)

## 2018-05-30 LAB — BLOOD GAS, ARTERIAL
Acid-Base Excess: 0.6 mmol/L (ref 0.0–2.0)
BICARBONATE: 25.2 mmol/L (ref 20.0–28.0)
Drawn by: 470591
FIO2: 0.21
O2 Saturation: 98.7 %
PATIENT TEMPERATURE: 98.6
PO2 ART: 147 mmHg — AB (ref 83.0–108.0)
pCO2 arterial: 44.1 mmHg (ref 32.0–48.0)
pH, Arterial: 7.375 (ref 7.350–7.450)

## 2018-05-30 LAB — GLUCOSE, CAPILLARY: Glucose-Capillary: 123 mg/dL — ABNORMAL HIGH (ref 70–99)

## 2018-05-30 LAB — POCT I-STAT CREATININE: Creatinine, Ser: 0.6 mg/dL (ref 0.44–1.00)

## 2018-05-30 LAB — TYPE AND SCREEN
ABO/RH(D): O POS
Antibody Screen: NEGATIVE

## 2018-05-30 SURGERY — IMPLANTATION, AORTIC VALVE, TRANSCATHETER, FEMORAL APPROACH
Anesthesia: General

## 2018-05-30 SURGERY — IMPLANTATION, AORTIC VALVE, TRANSCATHETER, FEMORAL APPROACH
Anesthesia: Monitor Anesthesia Care | Site: Chest

## 2018-05-30 MED ORDER — LIDOCAINE HCL 1 % IJ SOLN
INTRAMUSCULAR | Status: AC
  Filled 2018-05-30: qty 20

## 2018-05-30 MED ORDER — CHLORHEXIDINE GLUCONATE 4 % EX LIQD: 30.0000 mL | CUTANEOUS | Status: DC

## 2018-05-30 MED ORDER — TRAMADOL HCL 50 MG PO TABS: 50.0000 mg | ORAL_TABLET | ORAL | Status: DC | PRN

## 2018-05-30 MED ORDER — ROCURONIUM BROMIDE 50 MG/5ML IV SOSY
PREFILLED_SYRINGE | INTRAVENOUS | Status: AC
  Filled 2018-05-30: qty 5

## 2018-05-30 MED ORDER — CHLORHEXIDINE GLUCONATE 0.12 % MT SOLN
OROMUCOSAL | Status: AC
  Administered 2018-05-30: 15 mL via OROMUCOSAL
  Filled 2018-05-30: qty 15

## 2018-05-30 MED ORDER — FAMOTIDINE IN NACL 20-0.9 MG/50ML-% IV SOLN: 20.0000 mg | Freq: Two times a day (BID) | INTRAVENOUS | Status: DC

## 2018-05-30 MED ORDER — SODIUM CHLORIDE 0.9 % IV SOLN: INTRAVENOUS | Status: DC

## 2018-05-30 MED ORDER — NOREPINEPHRINE BITARTRATE 1 MG/ML IV SOLN
INTRAVENOUS | Status: DC | PRN
  Administered 2018-05-30: 2 ug/min via INTRAVENOUS

## 2018-05-30 MED ORDER — SUCRALFATE 1 GM/10ML PO SUSP
1.0000 g | Freq: Two times a day (BID) | ORAL | Status: DC
  Administered 2018-05-30 – 2018-06-02 (×7): 1 g via ORAL
  Filled 2018-05-30 (×7): qty 10

## 2018-05-30 MED ORDER — CHLORHEXIDINE GLUCONATE 4 % EX LIQD: 60.0000 mL | Freq: Once | CUTANEOUS | Status: DC

## 2018-05-30 MED ORDER — ROCURONIUM BROMIDE 100 MG/10ML IV SOLN
INTRAVENOUS | Status: DC | PRN
  Administered 2018-05-30: 50 mg via INTRAVENOUS
  Administered 2018-05-30: 20 mg via INTRAVENOUS

## 2018-05-30 MED ORDER — FENTANYL CITRATE (PF) 250 MCG/5ML IJ SOLN
INTRAMUSCULAR | Status: DC | PRN
  Administered 2018-05-30: 75 ug via INTRAVENOUS
  Administered 2018-05-30: 125 ug via INTRAVENOUS
  Administered 2018-05-30: 50 ug via INTRAVENOUS

## 2018-05-30 MED ORDER — 0.9 % SODIUM CHLORIDE (POUR BTL) OPTIME
TOPICAL | Status: DC | PRN
  Administered 2018-05-30: 4000 mL

## 2018-05-30 MED ORDER — SODIUM CHLORIDE 0.9 % IV SOLN
1.5000 g | Freq: Two times a day (BID) | INTRAVENOUS | Status: AC
  Administered 2018-05-30 – 2018-06-01 (×4): 1.5 g via INTRAVENOUS
  Filled 2018-05-30 (×4): qty 1.5

## 2018-05-30 MED ORDER — PROPOFOL 10 MG/ML IV BOLUS
INTRAVENOUS | Status: DC | PRN
  Administered 2018-05-30 (×2): 100 mg via INTRAVENOUS

## 2018-05-30 MED ORDER — SODIUM CHLORIDE 0.9% FLUSH
3.0000 mL | Freq: Two times a day (BID) | INTRAVENOUS | Status: DC
  Administered 2018-05-31 – 2018-06-01 (×3): 3 mL via INTRAVENOUS

## 2018-05-30 MED ORDER — SUGAMMADEX SODIUM 200 MG/2ML IV SOLN
INTRAVENOUS | Status: DC | PRN
  Administered 2018-05-30: 200 mg via INTRAVENOUS

## 2018-05-30 MED ORDER — SUCCINYLCHOLINE CHLORIDE 20 MG/ML IJ SOLN
INTRAMUSCULAR | Status: DC | PRN
  Administered 2018-05-30: 100 mg via INTRAVENOUS

## 2018-05-30 MED ORDER — NITROGLYCERIN IN D5W 200-5 MCG/ML-% IV SOLN: 0.0000 ug/min | INTRAVENOUS | Status: DC

## 2018-05-30 MED ORDER — PROTAMINE SULFATE 10 MG/ML IV SOLN
INTRAVENOUS | Status: DC | PRN
  Administered 2018-05-30: 40 mg via INTRAVENOUS
  Administered 2018-05-30: 10 mg via INTRAVENOUS
  Administered 2018-05-30: 30 mg via INTRAVENOUS

## 2018-05-30 MED ORDER — MORPHINE SULFATE (PF) 10 MG/ML IV SOLN
1.0000 mg | INTRAVENOUS | Status: DC | PRN
  Administered 2018-05-30: 2 mg via INTRAVENOUS

## 2018-05-30 MED ORDER — CHLORHEXIDINE GLUCONATE 0.12 % MT SOLN
15.0000 mL | Freq: Once | OROMUCOSAL | Status: AC
  Administered 2018-05-30: 15 mL via OROMUCOSAL

## 2018-05-30 MED ORDER — LACTATED RINGERS IV SOLN
INTRAVENOUS | Status: DC | PRN
  Administered 2018-05-30: 07:00:00 via INTRAVENOUS

## 2018-05-30 MED ORDER — ACETAMINOPHEN 650 MG RE SUPP: 650.0000 mg | Freq: Four times a day (QID) | RECTAL | Status: DC | PRN

## 2018-05-30 MED ORDER — SODIUM CHLORIDE 0.9 % IV SOLN: INTRAVENOUS | Status: AC

## 2018-05-30 MED ORDER — FAMOTIDINE 20 MG PO TABS
40.0000 mg | ORAL_TABLET | Freq: Every day | ORAL | Status: DC
  Administered 2018-05-31 – 2018-06-02 (×3): 40 mg via ORAL
  Filled 2018-05-30 (×3): qty 2

## 2018-05-30 MED ORDER — VANCOMYCIN HCL IN DEXTROSE 1-5 GM/200ML-% IV SOLN
1000.0000 mg | Freq: Once | INTRAVENOUS | Status: AC
  Administered 2018-05-30: 1000 mg via INTRAVENOUS
  Filled 2018-05-30 (×2): qty 200

## 2018-05-30 MED ORDER — HEPARIN SODIUM (PORCINE) 1000 UNIT/ML IJ SOLN
INTRAMUSCULAR | Status: DC | PRN
  Administered 2018-05-30: 8000 [IU] via INTRAVENOUS

## 2018-05-30 MED ORDER — IODIXANOL 320 MG/ML IV SOLN
INTRAVENOUS | Status: DC | PRN
  Administered 2018-05-30: 70.84 mL via INTRA_ARTERIAL

## 2018-05-30 MED ORDER — MORPHINE SULFATE (PF) 2 MG/ML IV SOLN
INTRAVENOUS | Status: AC
  Filled 2018-05-30: qty 1

## 2018-05-30 MED ORDER — AMLODIPINE BESYLATE 10 MG PO TABS
10.0000 mg | ORAL_TABLET | Freq: Every day | ORAL | Status: DC
  Administered 2018-05-30 – 2018-06-02 (×4): 10 mg via ORAL
  Filled 2018-05-30 (×5): qty 1

## 2018-05-30 MED ORDER — ONDANSETRON HCL 4 MG/2ML IJ SOLN
4.0000 mg | Freq: Four times a day (QID) | INTRAMUSCULAR | Status: DC | PRN
  Administered 2018-05-30 – 2018-05-31 (×3): 4 mg via INTRAVENOUS
  Filled 2018-05-30 (×3): qty 2

## 2018-05-30 MED ORDER — SODIUM CHLORIDE 0.9% FLUSH: 3.0000 mL | INTRAVENOUS | Status: DC | PRN

## 2018-05-30 MED ORDER — BUPROPION HCL ER (XL) 150 MG PO TB24
150.0000 mg | ORAL_TABLET | Freq: Every day | ORAL | Status: DC
  Administered 2018-05-31 – 2018-06-02 (×3): 150 mg via ORAL
  Filled 2018-05-30 (×3): qty 1

## 2018-05-30 MED ORDER — PROPOFOL 10 MG/ML IV BOLUS
INTRAVENOUS | Status: AC
  Filled 2018-05-30: qty 20

## 2018-05-30 MED ORDER — METOPROLOL TARTRATE 5 MG/5ML IV SOLN: 2.5000 mg | INTRAVENOUS | Status: DC | PRN

## 2018-05-30 MED ORDER — LACTATED RINGERS IV SOLN
INTRAVENOUS | Status: DC | PRN
  Administered 2018-05-30: 08:00:00 via INTRAVENOUS

## 2018-05-30 MED ORDER — OXYCODONE HCL 5 MG PO TABS
5.0000 mg | ORAL_TABLET | ORAL | Status: DC | PRN
  Administered 2018-05-30 – 2018-06-02 (×10): 10 mg via ORAL
  Filled 2018-05-30 (×10): qty 2

## 2018-05-30 MED ORDER — ACETAMINOPHEN 325 MG PO TABS
650.0000 mg | ORAL_TABLET | Freq: Four times a day (QID) | ORAL | Status: DC | PRN
  Administered 2018-05-30 – 2018-05-31 (×2): 650 mg via ORAL
  Filled 2018-05-30 (×2): qty 2

## 2018-05-30 MED ORDER — FENTANYL CITRATE (PF) 250 MCG/5ML IJ SOLN
INTRAMUSCULAR | Status: AC
  Filled 2018-05-30: qty 5

## 2018-05-30 MED ORDER — PANTOPRAZOLE SODIUM 40 MG PO TBEC
40.0000 mg | DELAYED_RELEASE_TABLET | Freq: Every day | ORAL | Status: DC
  Administered 2018-05-30 – 2018-06-01 (×3): 40 mg via ORAL
  Filled 2018-05-30 (×3): qty 1

## 2018-05-30 MED ORDER — DIPHENHYDRAMINE HCL 50 MG/ML IJ SOLN
INTRAMUSCULAR | Status: DC | PRN
  Administered 2018-05-30: 12.5 mg via INTRAVENOUS

## 2018-05-30 MED ORDER — PHENYLEPHRINE HCL-NACL 20-0.9 MG/250ML-% IV SOLN: 0.0000 ug/min | INTRAVENOUS | Status: DC

## 2018-05-30 MED ORDER — HEPARIN SODIUM (PORCINE) 1000 UNIT/ML IJ SOLN
INTRAMUSCULAR | Status: AC
  Filled 2018-05-30: qty 1

## 2018-05-30 MED ORDER — CALCIUM CHLORIDE 10 % IV SOLN
INTRAVENOUS | Status: AC
  Filled 2018-05-30: qty 10

## 2018-05-30 MED ORDER — ONDANSETRON HCL 4 MG/2ML IJ SOLN
INTRAMUSCULAR | Status: DC | PRN
  Administered 2018-05-30: 4 mg via INTRAVENOUS

## 2018-05-30 MED ORDER — SODIUM CHLORIDE 0.9 % IV SOLN
INTRAVENOUS | Status: AC
  Filled 2018-05-30 (×2): qty 1.2

## 2018-05-30 MED ORDER — SODIUM CHLORIDE 0.9 % IV SOLN: 250.0000 mL | INTRAVENOUS | Status: DC | PRN

## 2018-05-30 MED ORDER — MELATONIN 3 MG PO TABS
9.0000 mg | ORAL_TABLET | Freq: Every evening | ORAL | Status: DC | PRN
  Filled 2018-05-30: qty 3

## 2018-05-30 MED ORDER — DEXAMETHASONE SODIUM PHOSPHATE 10 MG/ML IJ SOLN
INTRAMUSCULAR | Status: DC | PRN
  Administered 2018-05-30: 5 mg via INTRAVENOUS

## 2018-05-30 MED ORDER — MORPHINE SULFATE (PF) 2 MG/ML IV SOLN
1.0000 mg | INTRAVENOUS | Status: DC | PRN
  Administered 2018-05-30 – 2018-06-02 (×6): 2 mg via INTRAVENOUS
  Filled 2018-05-30 (×6): qty 1

## 2018-05-30 MED ORDER — CLOPIDOGREL BISULFATE 75 MG PO TABS
75.0000 mg | ORAL_TABLET | Freq: Every day | ORAL | Status: DC
  Administered 2018-05-31 – 2018-06-02 (×3): 75 mg via ORAL
  Filled 2018-05-30 (×3): qty 1

## 2018-05-30 SURGICAL SUPPLY — 102 items
ADH SKN CLS APL DERMABOND .7 (GAUZE/BANDAGES/DRESSINGS) ×2
BAG DECANTER FOR FLEXI CONT (MISCELLANEOUS) IMPLANT
BAG SNAP BAND KOVER 36X36 (MISCELLANEOUS) ×4 IMPLANT
BLADE CLIPPER SURG (BLADE) IMPLANT
BLADE OSCILLATING /SAGITTAL (BLADE) IMPLANT
BLADE STERNUM SYSTEM 6 (BLADE) IMPLANT
BLADE SURG 10 STRL SS (BLADE) ×2 IMPLANT
BOOT SUTURE AID YELLOW STND (SUTURE) ×2 IMPLANT
CABLE ADAPT CONN TEMP 6FT (ADAPTER) ×4 IMPLANT
CANNULA FEM VENOUS REMOTE 22FR (CANNULA) IMPLANT
CANNULA OPTISITE PERFUSION 16F (CANNULA) IMPLANT
CANNULA OPTISITE PERFUSION 18F (CANNULA) IMPLANT
CATH DIAG EXPO 6F VENT PIG 145 (CATHETERS) ×8 IMPLANT
CATH EXPO 5FR AL1 (CATHETERS) IMPLANT
CATH EXTERNAL FEMALE PUREWICK (CATHETERS) IMPLANT
CATH INFINITI 6F AL2 (CATHETERS) IMPLANT
CATH S G BIP PACING (CATHETERS) ×4 IMPLANT
CLIP VESOCCLUDE MED 24/CT (CLIP) IMPLANT
CLIP VESOCCLUDE SM WIDE 24/CT (CLIP) IMPLANT
CONT SPEC 4OZ CLIKSEAL STRL BL (MISCELLANEOUS) ×8 IMPLANT
COVER BACK TABLE 24X17X13 BIG (DRAPES) IMPLANT
COVER BACK TABLE 80X110 HD (DRAPES) ×4 IMPLANT
COVER DOME SNAP 22 D (MISCELLANEOUS) IMPLANT
COVER WAND RF STERILE (DRAPES) ×4 IMPLANT
CRADLE DONUT ADULT HEAD (MISCELLANEOUS) ×4 IMPLANT
DECANTER SPIKE VIAL GLASS SM (MISCELLANEOUS) ×4 IMPLANT
DERMABOND ADVANCED (GAUZE/BANDAGES/DRESSINGS) ×2
DERMABOND ADVANCED .7 DNX12 (GAUZE/BANDAGES/DRESSINGS) ×2 IMPLANT
DEVICE CLOSURE MYNXGRIP 6/7F (Vascular Products) ×2 IMPLANT
DEVICE CLOSURE PERCLS PRGLD 6F (VASCULAR PRODUCTS) ×4 IMPLANT
DRAPE INCISE IOBAN 66X45 STRL (DRAPES) IMPLANT
DRSG TEGADERM 4X4.75 (GAUZE/BANDAGES/DRESSINGS) ×8 IMPLANT
DRYSEAL FLEXSHEATH 18FR 33CM (SHEATH) ×2
ELECT CAUTERY BLADE 6.4 (BLADE) IMPLANT
ELECT REM PT RETURN 9FT ADLT (ELECTROSURGICAL) ×8
ELECTRODE REM PT RTRN 9FT ADLT (ELECTROSURGICAL) ×4 IMPLANT
FELT TEFLON 6X6 (MISCELLANEOUS) IMPLANT
FEMORAL VENOUS CANN RAP (CANNULA) IMPLANT
GAUZE SPONGE 4X4 12PLY STRL (GAUZE/BANDAGES/DRESSINGS) ×4 IMPLANT
GAUZE SPONGE 4X4 12PLY STRL LF (GAUZE/BANDAGES/DRESSINGS) ×3 IMPLANT
GLOVE BIO SURGEON STRL SZ7.5 (GLOVE) ×6 IMPLANT
GLOVE BIO SURGEON STRL SZ8 (GLOVE) IMPLANT
GLOVE EUDERMIC 7 POWDERFREE (GLOVE) ×2 IMPLANT
GLOVE ORTHO TXT STRL SZ7.5 (GLOVE) IMPLANT
GOWN STRL REUS W/ TWL LRG LVL3 (GOWN DISPOSABLE) IMPLANT
GOWN STRL REUS W/ TWL XL LVL3 (GOWN DISPOSABLE) ×2 IMPLANT
GOWN STRL REUS W/TWL LRG LVL3 (GOWN DISPOSABLE)
GOWN STRL REUS W/TWL XL LVL3 (GOWN DISPOSABLE) ×4
GUIDEWIRE SAFE TJ AMPLATZ EXST (WIRE) ×4 IMPLANT
GUIDEWIRE STRAIGHT .035 260CM (WIRE) ×4 IMPLANT
INSERT FOGARTY SM (MISCELLANEOUS) IMPLANT
KIT BASIN OR (CUSTOM PROCEDURE TRAY) ×4 IMPLANT
KIT DILATOR VASC 18G NDL (KITS) IMPLANT
KIT HEART LEFT (KITS) ×4 IMPLANT
KIT SUCTION CATH 14FR (SUCTIONS) IMPLANT
KIT TURNOVER KIT B (KITS) ×4 IMPLANT
LOOP VESSEL MAXI BLUE (MISCELLANEOUS) IMPLANT
LOOP VESSEL MINI RED (MISCELLANEOUS) IMPLANT
NDL PERC 18GX7CM (NEEDLE) ×2 IMPLANT
NEEDLE 22X1 1/2 (OR ONLY) (NEEDLE) ×4 IMPLANT
NEEDLE PERC 18GX7CM (NEEDLE) ×4 IMPLANT
NS IRRIG 1000ML POUR BTL (IV SOLUTION) ×4 IMPLANT
PACK ENDO MINOR (CUSTOM PROCEDURE TRAY) ×4 IMPLANT
PAD ARMBOARD 7.5X6 YLW CONV (MISCELLANEOUS) ×8 IMPLANT
PAD ELECT DEFIB RADIOL ZOLL (MISCELLANEOUS) ×4 IMPLANT
PENCIL BUTTON HOLSTER BLD 10FT (ELECTRODE) IMPLANT
PERCLOSE PROGLIDE 6F (VASCULAR PRODUCTS) ×8
SET MICROPUNCTURE 5F STIFF (MISCELLANEOUS) ×4 IMPLANT
SHEATH BRITE TIP 6FR 35CM (SHEATH) ×4 IMPLANT
SHEATH DRYSEAL FLEX 18FR 33CM (SHEATH) IMPLANT
SHEATH PINNACLE 6F 10CM (SHEATH) ×4 IMPLANT
SHEATH PINNACLE 8F 10CM (SHEATH) ×4 IMPLANT
SLEEVE REPOSITIONING LENGTH 30 (MISCELLANEOUS) ×4 IMPLANT
SPONGE LAP 18X18 X RAY DECT (DISPOSABLE) ×2 IMPLANT
SPONGE LAP 4X18 RFD (DISPOSABLE) IMPLANT
STOPCOCK MORSE 400PSI 3WAY (MISCELLANEOUS) ×8 IMPLANT
SUT ETHIBOND X763 2 0 SH 1 (SUTURE) IMPLANT
SUT GORETEX CV 4 TH 22 36 (SUTURE) IMPLANT
SUT GORETEX CV4 TH-18 (SUTURE) IMPLANT
SUT MNCRL AB 3-0 PS2 18 (SUTURE) IMPLANT
SUT PROLENE 5 0 C 1 36 (SUTURE) IMPLANT
SUT PROLENE 6 0 C 1 30 (SUTURE) IMPLANT
SUT SILK  1 MH (SUTURE) ×2
SUT SILK 1 MH (SUTURE) ×2 IMPLANT
SUT VIC AB 2-0 CT1 27 (SUTURE)
SUT VIC AB 2-0 CT1 TAPERPNT 27 (SUTURE) IMPLANT
SUT VIC AB 2-0 CTX 36 (SUTURE) IMPLANT
SUT VIC AB 3-0 SH 8-18 (SUTURE) IMPLANT
SYR 50ML LL SCALE MARK (SYRINGE) ×4 IMPLANT
SYR BULB IRRIGATION 50ML (SYRINGE) IMPLANT
SYR CONTROL 10ML LL (SYRINGE) IMPLANT
SYS DEL EVOLUT PROPLS 23 26 29 (CATHETERS) ×4
SYS LOAD EVOLT PROPLS 23 26 29 (CATHETERS) ×4
SYSTEM DEL EVLT PRPLS 23 26 29 (CATHETERS) ×1 IMPLANT
SYSTEM LOAD EVLT PRPLS23 26 29 (CATHETERS) IMPLANT
TAPE CLOTH SURG 4X10 WHT LF (GAUZE/BANDAGES/DRESSINGS) ×2 IMPLANT
TOWEL GREEN STERILE (TOWEL DISPOSABLE) ×8 IMPLANT
TRANSDUCER W/STOPCOCK (MISCELLANEOUS) ×8 IMPLANT
TRAY FOLEY SLVR 16FR TEMP STAT (SET/KITS/TRAYS/PACK) IMPLANT
URINAL MALE W/LID DISP 1000CC (MISCELLANEOUS) IMPLANT
VALVE AORTIC EVOLUT PROPLUS 26 (Valve) ×2 IMPLANT
WIRE .035 3MM-J 145CM (WIRE) ×4 IMPLANT

## 2018-05-30 NOTE — Op Note (Signed)
[facility]   MULTIDISCIPLINARY HEART VALVE TEAM   TAVR OPERATIVE NOTE   Date of Procedure:  05/30/2018  Preoperative Diagnosis: Severe Aortic Stenosis   Postoperative Diagnosis: Same   Procedure:    Transcatheter Aortic Valve Replacement - Percutaneous Right Transfemoral Approach  Medtronic Evolut PRO + (size 26 mm, serial # [id number])   Co-Surgeons:  Alleen BorneBryan K. , MD and Verne Carrowhristopher McAlhany, MD   Anesthesiologist:  Hyman Bower. Ellender, MD  Echocardiographer:  Eloy EndK. Nelson, MD  Pre-operative Echo Findings:  Severe aortic stenosis  Normal left ventricular systolic function  Post-operative Echo Findings:  Trivial paravalvular leak  Normal left ventricular systolic function   BRIEF CLINICAL NOTE AND INDICATIONS FOR SURGERY  This 85 year old woman has stage D, severe, symptomatic aortic stenosis with New York Heart Association class II-III symptoms of exertional shortness of breath and fatigue as well as 3 prior episodes of dizziness and presyncope. She also has shortness of breath, wheezing, and burning chest pain when lying down at night which are most likely related to her severe reflux with esophagitis. I have personally reviewed her 2D echocardiogram, cardiac catheterization, and CTA studies. Echocardiogram from 02/24/2018 shows a trileaflet aortic valve with moderately thickened and calcified leaflets with restricted mobility. The mean gradient across aortic valve was 35 mmHg with a dimensionless index of 0.24 and aortic valve area of 0.8 cm consistent with severe aortic stenosis. Left ventricular systolic function is normal with grade 2 diastolic dysfunction. Her cardiac catheterization shows no angiographic evidence of coronary disease. The mean gradient was 38 mmHg and the peak to peak gradient was 43 mmHg consistent with severe aortic stenosis. I agree that aortic valve replacement is indicated in this patient for relief of her symptoms and to prevent progressive  left ventricular deterioration. This will also allow her to safely undergo further surgery for her hiatal hernia and reflux which is very symptomatic. I think transcatheter aortic valve replacement would be the best treatment for this 85 year old woman. Her gated cardiac CTA shows anatomy favorable for transcatheter aortic valve replacement. She has a relatively small annulus and would likely benefit from using a Medtronic Evolut valve. Her CTA of the abdomen and pelvis shows adequate pelvic vascular anatomy to allow transfemoral insertion by an 18 French sheath if a Medtronic valve is used.   The patient was counseled at length regarding treatment alternatives for management of severe symptomatic aortic stenosis. The risks and benefits of surgical intervention has been discussed in detail. Long-term prognosis with medical therapy was discussed. Alternative approaches such as conventional surgical aortic valve replacement, transcatheter aortic valve replacement, and palliative medical therapy were compared and contrasted at length. This discussion was placed in the context of the patient's own specific clinical presentation and past medical history. All of her questions have been addressed.   Following the decision to proceed with transcatheter aortic valve replacement, a discussion was held regarding what types of management strategies would be attempted intraoperatively in the event of life-threatening complications, including whether or not the patient would be considered a candidate for the use of cardiopulmonary bypass and/or conversion to open sternotomy for attempted surgical intervention. She is 85 years old but active and has no coronary disease, normal LV systolic function and is a low risk surgical patient so I would consider her a candidate for sternotomy if needed to address intraoperative complications. The patient is aware of the fact that transient use of cardiopulmonary bypass may be  necessary.The patient has been advised of a variety  of complications that might develop including but not limited to risks of death, stroke, paravalvular leak, aortic dissection or other major vascular complications, aortic annulus rupture, device embolization, cardiac rupture or perforation, mitral regurgitation, acute myocardial infarction, arrhythmia, heart block or bradycardia requiring permanent pacemaker placement, congestive heart failure, respiratory failure, renal failure, pneumonia, infection, other late complications related to structural valve deterioration or migration, or other complications that might ultimately cause a temporary or permanent loss of functional independence or other long term morbidity. The patient provides full informed consent for the procedure as described and all questions were answered.     DETAILS OF THE OPERATIVE PROCEDURE  PREPARATION:    The patient is brought to the operating room on the above mentioned date and central monitoring was established by the anesthesia team including placement of a central venous line and radial arterial line. The patient is placed in the supine position on the operating table.  Intravenous antibiotics are administered. General endotracheal anesthesia is induced uneventfully.  A Foley catheter is placed.  Baseline transesophageal echocardiogram was performed. The patient's chest, abdomen, both groins, and both lower extremities are prepared and draped in a sterile manner. A time out procedure is performed.   PERIPHERAL ACCESS:    Using the modified Seldinger technique, femoral arterial and venous access was obtained with placement of 6 Fr sheaths on the left side.  A pigtail diagnostic catheter was passed through the left arterial sheath under fluoroscopic guidance into the aortic root.  A temporary transvenous pacemaker catheter was passed through the left femoral venous sheath under fluoroscopic guidance into the right ventricle.   The pacemaker was tested to ensure stable lead placement and pacemaker capture. Aortic root angiography was performed in order to determine the optimal angiographic angle for valve deployment.   TRANSFEMORAL ACCESS:   Percutaneous transfemoral access and sheath placement was performed  using ultrasound guidance.  The right common femoral artery was cannulated using a micropuncture needle and appropriate location was verified using hand injection angiogram.  A pair of Abbott Perclose percutaneous closure devices were placed and a 6 French sheath replaced into the femoral artery.  The patient was heparinized systemically and ACT verified > 250 seconds.    An 18 Fr Dry Seal sheath was introduced into the right femoral artery after progressively dilating over an Amplatz superstiff wire. An AL-1 catheter was used to direct a straight-tip exchange length wire across the native aortic valve into the left ventricle. This was exchanged out for a pigtail catheter and position was confirmed in the LV apex. Simultaneous LV and Ao pressures were recorded.  The pigtail catheter was exchanged for a Condida wire in the LV apex.  Echocardiography was utilized to confirm appropriate wire position and no sign of entanglement in the mitral subvalvular apparatus.   BALLOON AORTIC VALVULOPLASTY:   Not performed  TRANSCATHETER HEART VALVE DEPLOYMENT:   A Medtronic Evolut Pro transcatheter heart valve (size 26 mm) was prepared per manufacturer's guidelines. The valve was advanced through the introducer sheath using normal technique until in an appropriate position in the abdominal aorta beyond the sheath tip. The valve was then advanced across the aortic arch and carefully positioned across the aortic valve annulus. The valve was 80% deployed and position of the valve has been confirmed by angiographic assessment. The valve is completely deployed while rapid ventricular pacing at 110 beat per minute. The balloon inflation  is held for >3 seconds after reaching full deployment volume. Valve function is assessed using  echocardiography. There is felt to be trivial paravalvular leak and no central aortic insufficiency.  The patient's hemodynamic recovery following valve deployment is good. Echo demostrated acceptable post-procedural gradients, stable mitral valve function, and trivial AI.    PROCEDURE COMPLETION:   The sheath was removed and femoral artery closure performed using the previously placed Perclose devices.  Protamine was administered once femoral arterial repair was complete. The temporary pacemaker, pigtail catheters and femoral sheaths were removed using a Mynx closure device for the left femoral artery and manual pressure used for the left venous sheath.  The patient tolerated the procedure well and is transported to the cath lab recovery area in stable condition. There were no immediate intraoperative complications. All sponge instrument and needle counts are verified correct at completion of the operation.   No blood products were administered during the operation.  The patient received a total of 70.8 mL of intravenous contrast during the procedure.   Alleen BorneBryan K , MD 05/30/2018 3:52 PM

## 2018-05-30 NOTE — Progress Notes (Signed)
Patient ID: Sherri Mcmillan, female   DOB: 1933-04-11, 85 y.o.   MRN: [id number] TCTS:  She has been hemodynamically stable in sinus rhythm 70's. Awake and alert. Neuro intact. Only complaint is of chest pain from coughing which she feels is related to her reflux. Valve sounds normal. Groin sites look good.

## 2018-05-30 NOTE — Anesthesia Procedure Notes (Signed)
Procedure Name: Intubation Date/Time: 05/30/2018 7:45 AM Performed by: Dorthea Cove, RN Pre-anesthesia Checklist: Patient identified, Emergency Drugs available, Suction available and Patient being monitored Patient Re-evaluated:Patient Re-evaluated prior to induction Oxygen Delivery Method: Circle System Utilized Preoxygenation: Pre-oxygenation with 100% oxygen Induction Type: IV induction Ventilation: Mask ventilation without difficulty Laryngoscope Size: Mac and 3 Grade View: Grade I Tube type: Oral Tube size: 7.0 mm Number of attempts: 1 Airway Equipment and Method: Stylet and Oral airway Placement Confirmation: ETT inserted through vocal cords under direct vision,  positive ETCO2 and breath sounds checked- equal and bilateral Secured at: 21 cm Tube secured with: Tape Dental Injury: Teeth and Oropharynx as per pre-operative assessment

## 2018-05-30 NOTE — Anesthesia Postprocedure Evaluation (Signed)
Anesthesia Post Note  Patient: Sherri Mcmillan  Procedure(s) Performed: TRANSCATHETER AORTIC VALVE REPLACEMENT, TRANSFEMORAL - MEDTRONIC VALVE (N/A Chest) TRANSESOPHAGEAL ECHOCARDIOGRAM (TEE) (N/A )     Patient location during evaluation: Cath Lab Anesthesia Type: General Level of consciousness: awake Pain management: pain level controlled Vital Signs Assessment: post-procedure vital signs reviewed and stable Respiratory status: spontaneous breathing, nonlabored ventilation, respiratory function stable and patient connected to nasal cannula oxygen Cardiovascular status: blood pressure returned to baseline and stable Postop Assessment: no apparent nausea or vomiting Anesthetic complications: no    Last Vitals:  Vitals:   05/30/18 1430 05/30/18 1500  BP: 135/87 (!) 180/74  Pulse: 66 61  Resp: (!) 22 12  Temp:    SpO2: 100% 98%    Last Pain:  Vitals:   05/30/18 1500  TempSrc:   PainSc: 0-No pain                  P 

## 2018-05-30 NOTE — Progress Notes (Signed)
No Morphine loaded in any pyxis on unit. Pharmacy notified. Pt given Oxy 10mg  see MAR.

## 2018-05-30 NOTE — Anesthesia Procedure Notes (Signed)
Central Venous Catheter Insertion Performed by: ,  Edward, MD, anesthesiologist Start/End2/07/2018 7:15 AM, 05/30/2018 7:25 AM Patient location: Pre-op. Preanesthetic checklist: patient identified, IV checked, site marked, risks and benefits discussed, surgical consent, monitors and equipment checked, pre-op evaluation, timeout performed and anesthesia consent Position: Trendelenburg Lidocaine 1% used for infiltration and patient sedated Hand hygiene performed , maximum sterile barriers used  and Seldinger technique used Catheter size: 8 Fr Total catheter length 16. Central line was placed.Double lumen Procedure performed using ultrasound guided technique. Ultrasound Notes:anatomy identified, needle tip was noted to be adjacent to the nerve/plexus identified, no ultrasound evidence of intravascular and/or intraneural injection and image(s) printed for medical record Attempts: 1 Following insertion, dressing applied, line sutured and Biopatch. Post procedure assessment: blood return through all ports, free fluid flow and no air  Patient tolerated the procedure with difficulty.        

## 2018-05-30 NOTE — Anesthesia Procedure Notes (Signed)
Arterial Line Insertion Start/End2/07/2018 7:00 AM, 05/30/2018 7:05 AM Performed by: Leonides GrillsEllender, Ryan P, MD, Lelon PerlaWoodall, , RN, CRNA  Patient location: Pre-op. Preanesthetic checklist: patient identified, IV checked, site marked, risks and benefits discussed, surgical consent, monitors and equipment checked, pre-op evaluation, timeout performed and anesthesia consent Lidocaine 1% used for infiltration radial was placed Catheter size: 20 G  Attempts: 1 Procedure performed without using ultrasound guided technique. Following insertion, Biopatch and dressing applied. Post procedure assessment: normal

## 2018-05-30 NOTE — Progress Notes (Signed)
@  [phone] Paged Dr. Okey Dupre (cardiologist on-call) regarding pt's complaints of severe reflux and accompanying substernal and right shoulder pain unrelieved by current PRN pain medications and pt's scheduled Carafate and Protonix. No page returned. Upon rechecking pt, she appeared to be resting comfortably. Will continue to monitor.

## 2018-05-30 NOTE — CV Procedure (Signed)
[facility]  TAVR OPERATIVE NOTE   Date of Procedure:  05/30/2018  Preoperative Diagnosis: Severe Aortic Stenosis   Postoperative Diagnosis: Same   Procedure:    Transcatheter Aortic Valve Replacement - Transfemoral Approach  Medtronic Evolut Pro Valve (size 26 mm, model # [id number], serial # [id number])   Co-Surgeons:  Lauree Chandler, MD and Gaye Pollack, MD   Anesthesiologist:  Roanna Banning  Echocardiographer:  Meda Coffee  Pre-operative Echo Findings:  Severe aortic stenosis  Normal left ventricular systolic function  Post-operative Echo Findings:  Trivial paravalvular leak  Normal left ventricular systolic function  BRIEF CLINICAL NOTE AND INDICATIONS FOR SURGERY  85 yo female with history of anemia, hiatal hernia, severe AS. Normal LV systolic function. No CAD by cath. Progressive dyspnea with exertion. Plans for TAVR today.   During the course of the patient's preoperative work up they have been evaluated comprehensively by a multidisciplinary team of specialists coordinated through the [facility] in the [place] [facility].  They have been demonstrated to suffer from symptomatic severe aortic stenosis as noted above. The patient has been counseled extensively as to the relative risks and benefits of all options for the treatment of severe aortic stenosis including long term medical therapy, conventional surgery for aortic valve replacement, and transcatheter aortic valve replacement.  The patient has been independently evaluated by Dr. Cyndia Bent with CT surgery and they are felt to be at high risk for conventional surgical aortic valve replacement. The surgeon indicated the patient would be a poor candidate for conventional surgery. Based upon review of all of the patient's preoperative diagnostic tests they are felt to be candidate for transcatheter aortic valve replacement using the transfemoral approach as  an alternative to high risk conventional surgery.    Following the decision to proceed with transcatheter aortic valve replacement, a discussion has been held regarding what types of management strategies would be attempted intraoperatively in the event of life-threatening complications, including whether or not the patient would be considered a candidate for the use of cardiopulmonary bypass and/or conversion to open sternotomy for attempted surgical intervention.  The patient has been advised of a variety of complications that might develop peculiar to this approach including but not limited to risks of death, stroke, paravalvular leak, aortic dissection or other major vascular complications, aortic annulus rupture, device embolization, cardiac rupture or perforation, acute myocardial infarction, arrhythmia, heart block or bradycardia requiring permanent pacemaker placement, congestive heart failure, respiratory failure, renal failure, pneumonia, infection, other late complications related to structural valve deterioration or migration, or other complications that might ultimately cause a temporary or permanent loss of functional independence or other long term morbidity.  The patient provides full informed consent for the procedure as described and all questions were answered preoperatively.    DETAILS OF THE OPERATIVE PROCEDURE  PREPARATION:   The patient is brought to the operating room on the above mentioned date and central monitoring was established by the anesthesia team including placement of a radial arterial line. The patient is placed in the supine position on the operating table.  Intravenous antibiotics are administered. Conscious sedation is used.   Baseline transesophageal echocardiogram was performed. The patient's chest, abdomen, both groins, and both lower extremities are prepared and draped in a sterile manner. A time out procedure is performed.   PERIPHERAL ACCESS:   Using the  modified Seldinger technique, femoral arterial and venous access were obtained with placement of 6 Fr sheaths on the left  side using u/s guidance.  A pigtail diagnostic catheter was passed through the femoral arterial sheath under fluoroscopic guidance into the aortic root.  A temporary transvenous pacemaker catheter was passed through the femoral venous sheath under fluoroscopic guidance into the right ventricle.  The pacemaker was tested to ensure stable lead placement and pacemaker capture.   TRANSFEMORAL ACCESS:  A micropuncture kit was used to gain access to the right femoral artery using u/s guidance. Position confirmed with angiography. Pre-closure with double ProGlide closure devices. The patient was heparinized systemically and ACT verified > 250 seconds.    A 18 Fr transfemoral Dry Seal was introduced into the right femoral artery after progressively dilating over an Amplatz superstiff wire. An AL-1 catheter was used to direct a straight-tip exchange length wire across the native aortic valve into the left ventricle. This was exchanged out for a pigtail catheter and position was confirmed in the LV apex.  The pigtail catheter was then exchanged for an Confida wire in the LV apex.   TRANSCATHETER HEART VALVE DEPLOYMENT:  A Medtronic Evolut Pro valve (size 26 mm) was prepared per manufacturer's guidelines. The valve was advanced through the introducer sheath using normal technique until in an appropriate position in the abdominal aorta beyond the sheath tip.  The valve was carefully positioned across the aortic valve annulus. The valve was deployed using fluoroscopy and angiography positioning.  TEE used for post deployment imaging. There is felt to be trivial paravalvular leak and no central aortic insufficiency.  The patient's hemodynamic recovery following valve deployment is good.  Echo demostrated acceptable post-procedural gradients, stable mitral valve function, and trivial AI.   PROCEDURE  COMPLETION:  The sheath was then removed and closure devices were completed. Protamine was administered once femoral arterial repair was complete. The temporary pacemaker, pigtail catheters and femoral sheaths were removed with Mynx closure device placed in the artery and manual pressure used after removal of the venous sheath.    The patient tolerated the procedure well and is transported to the surgical intensive care in stable condition. There were no immediate intraoperative complications. All sponge instrument and needle counts are verified correct at completion of the operation.   No blood products were administered during the operation.  The patient received a total of 70.8 mL of intravenous contrast during the procedure.  Lauree Chandler MD 05/30/2018 9:30 AM

## 2018-05-30 NOTE — Transfer of Care (Signed)
Immediate Anesthesia Transfer of Care Note  Patient: Sherri Mcmillan  Procedure(s) Performed: TRANSCATHETER AORTIC VALVE REPLACEMENT, TRANSFEMORAL - MEDTRONIC VALVE (N/A Chest) TRANSESOPHAGEAL ECHOCARDIOGRAM (TEE) (N/A )  Patient Location: Cath Lab  Anesthesia Type:General  Level of Consciousness: awake, alert  and oriented  Airway & Oxygen Therapy: Patient Spontanous Breathing and Patient connected to nasal cannula oxygen  Post-op Assessment: Report given to RN, Post -op Vital signs reviewed and stable and Patient moving all extremities X 4  Post vital signs: Reviewed and stable  Last Vitals:  Vitals Value Taken Time  BP    Temp 36.7 C 05/30/2018  9:41 AM  Pulse    Resp    SpO2      Last Pain:  Vitals:   05/30/18 0941  TempSrc: Temporal  PainSc:          Complications: No apparent anesthesia complications

## 2018-05-30 NOTE — Progress Notes (Signed)
Cline Crock PA at bedside. Pt educated regarding importance to keep legs straight and not to bend them due to bleeding issues. Bilateral groin site level 0 see flowsheet.

## 2018-05-30 NOTE — Progress Notes (Signed)
Pt endorses substernal chest and R shoulder pain accompanied by nausea. Zofran and PRN Morphine administered. Upon further evaluation pt states these symptoms seem to be exacerbations of her chronic reflux likely secondary to her bedrest and low head position required post-procedurally for her TAVR. Home oxycodone administered with APAP for additional pain control. All VSS with pt mildly hypertensive. Will continue to monitor.

## 2018-05-30 NOTE — Progress Notes (Signed)
  Echocardiogram Echocardiogram Transesophageal has been performed.  Leta Jungling M 05/30/2018, 9:41 AM

## 2018-05-30 NOTE — Progress Notes (Addendum)
  [facility]   MULTIDISCIPLINARY HEART VALVE TEAM  Patient doing well s/p TAVR. She is hemodynamically stable. Groin sites stable. ECG with no high grade block but does show new LBBB with QRS 124 ms. Plan to DC arterial line and transfer to 4E. Plan for early ambulation after bedrest completed and hopeful discharge over the next 24-48 hours. Complaining of neck pain and reflux pain. Okay for PRN pain meds. Also will start IV pepcid. Of note, she has an aspirin allergy and broke out with a painful rash on her face so only plavix ordered.   Cline Crock PA-C  [facility]  Pager [phone]

## 2018-05-30 NOTE — Progress Notes (Signed)
Pt arrived to room 4E07 in bed. Pt placed on bedside monitor. Purewick in place. Suction set up. Pt c/o generalized pain to bilateral shoulders, abd, and head. Cline Crock PA paged regarding abd pain. V/S started Q15 see flowsheet. Assessement complete see flowsheet.

## 2018-05-30 NOTE — Interval H&P Note (Signed)
History and Physical Interval Note:  05/30/2018 5:49 AM  Sherri Mcmillan  has presented today for surgery, with the diagnosis of Severe Aortic Stenosis  The various methods of treatment have been discussed with the patient and family. After consideration of risks, benefits and other options for treatment, the patient has consented to  Procedure(s): TRANSCATHETER AORTIC VALVE REPLACEMENT, TRANSFEMORAL (N/A) TRANSESOPHAGEAL ECHOCARDIOGRAM (TEE) (N/A) as a surgical intervention .  The patient's history has been reviewed, patient examined, no change in status, stable for surgery.  I have reviewed the patient's chart and labs.  Questions were answered to the patient's satisfaction.     Alleen Borne

## 2018-05-31 ENCOUNTER — Other Ambulatory Visit: Payer: Self-pay | Admitting: Internal Medicine

## 2018-05-31 ENCOUNTER — Encounter (HOSPITAL_COMMUNITY): Payer: Self-pay | Admitting: Cardiovascular Disease

## 2018-05-31 ENCOUNTER — Inpatient Hospital Stay (HOSPITAL_COMMUNITY): Payer: Medicare Other

## 2018-05-31 DIAGNOSIS — I340 Nonrheumatic mitral (valve) insufficiency: Secondary | ICD-10-CM

## 2018-05-31 DIAGNOSIS — I350 Nonrheumatic aortic (valve) stenosis: Secondary | ICD-10-CM

## 2018-05-31 DIAGNOSIS — Z954 Presence of other heart-valve replacement: Secondary | ICD-10-CM

## 2018-05-31 DIAGNOSIS — I10 Essential (primary) hypertension: Secondary | ICD-10-CM

## 2018-05-31 DIAGNOSIS — I361 Nonrheumatic tricuspid (valve) insufficiency: Secondary | ICD-10-CM

## 2018-05-31 LAB — CBC
HCT: 35.1 % — ABNORMAL LOW (ref 36.0–46.0)
Hemoglobin: 10.8 g/dL — ABNORMAL LOW (ref 12.0–15.0)
MCH: 26.1 pg (ref 26.0–34.0)
MCHC: 30.8 g/dL (ref 30.0–36.0)
MCV: 84.8 fL (ref 80.0–100.0)
Platelets: 121 10*3/uL — ABNORMAL LOW (ref 150–400)
RBC: 4.14 MIL/uL (ref 3.87–5.11)
RDW: 21.6 % — ABNORMAL HIGH (ref 11.5–15.5)
WBC: 6 10*3/uL (ref 4.0–10.5)
nRBC: 0 % (ref 0.0–0.2)

## 2018-05-31 LAB — BASIC METABOLIC PANEL
Anion gap: 9 (ref 5–15)
BUN: 11 mg/dL (ref 8–23)
CHLORIDE: 103 mmol/L (ref 98–111)
CO2: 25 mmol/L (ref 22–32)
Calcium: 9.1 mg/dL (ref 8.9–10.3)
Creatinine, Ser: 0.89 mg/dL (ref 0.44–1.00)
GFR calc Af Amer: >60 mL/min (ref >60)
GFR calc non Af Amer: 60 mL/min — ABNORMAL LOW (ref >60)
Glucose, Bld: 116 mg/dL — ABNORMAL HIGH (ref 70–99)
Potassium: 3.6 mmol/L (ref 3.5–5.1)
Sodium: 137 mmol/L (ref 135–145)

## 2018-05-31 LAB — ECHOCARDIOGRAM LIMITED
Height: 60 in
Weight: 1908.3 oz

## 2018-05-31 LAB — MAGNESIUM: Magnesium: 1.8 mg/dL (ref 1.7–2.4)

## 2018-05-31 MED ORDER — TRAMADOL HCL 50 MG PO TABS
50.0000 mg | ORAL_TABLET | ORAL | Status: DC | PRN
  Administered 2018-05-31 – 2018-06-01 (×3): 50 mg via ORAL
  Filled 2018-05-31 (×4): qty 1

## 2018-05-31 NOTE — Progress Notes (Signed)
1 Day Post-Op Procedure(s) (LRB): TRANSCATHETER AORTIC VALVE REPLACEMENT, TRANSFEMORAL - MEDTRONIC VALVE (N/A) TRANSESOPHAGEAL ECHOCARDIOGRAM (TEE) (N/A) Subjective:  Only complaint is of burning chest pain overnight which is the same pain she has at home from reflux.  Objective: Vital signs in last 24 hours: Temp:  [97 F (36.1 C)-98.5 F (36.9 C)] 98.3 F (36.8 C) (02/05 0700) Pulse Rate:  [53-213] 74 (02/05 0700) Cardiac Rhythm: Normal sinus rhythm (02/05 0700) Resp:  [7-22] 17 (02/05 0700) BP: (120-180)/(57-102) 126/80 (02/05 0700) SpO2:  [88 %-100 %] 99 % (02/05 0700) Weight:  [54.1 kg] 54.1 kg (02/05 0326)  Hemodynamic parameters for last 24 hours:    Intake/Output from previous day: 02/04 0701 - 02/05 0700 In: 2700 [P.O.:600; I.V.:1600; IV Piggyback:500] Out: 575 [Urine:550; Blood:25] Intake/Output this shift: Total I/O In: 120 [P.O.:120] Out: -   General appearance: alert and cooperative Neurologic: intact Heart: regular rate and rhythm, S1, S2 normal, no murmur, click, rub or gallop Lungs: clear to auscultation bilaterally Abdomen: soft, non-tender; bowel sounds normal; no masses,  no organomegaly Extremities: extremities normal, atraumatic, no cyanosis or edema Wound: groin sites look good.  Lab Results: Recent Labs    05/30/18 1008 05/31/18 0341  WBC  --  6.0  HGB 10.9* 10.8*  HCT 32.0* 35.1*  PLT  --  121*   BMET:  Recent Labs    05/30/18 0918 05/30/18 1000 05/30/18 1008 05/31/18 0341  NA 137  --  137 137  K 3.7  --  3.8 3.6  CL  --   --   --  103  CO2  --   --   --  25  GLUCOSE 108*  --   --  116*  BUN  --   --   --  11  CREATININE  --  0.60  --  0.89  CALCIUM  --   --   --  9.1    PT/INR: No results for input(s): LABPROT, INR in the last 72 hours. ABG    Component Value Date/Time   PHART 7.322 (L) 05/30/2018 1008   HCO3 24.7 05/30/2018 1008   TCO2 26 05/30/2018 1008   ACIDBASEDEF 2.0 05/30/2018 1008   O2SAT 99.0 05/30/2018 1008    CBG (last 3)  Recent Labs    05/30/18 2213  GLUCAP 123*    Assessment/Plan: S/P Procedure(s) (LRB): TRANSCATHETER AORTIC VALVE REPLACEMENT, TRANSFEMORAL - MEDTRONIC VALVE (N/A) TRANSESOPHAGEAL ECHOCARDIOGRAM (TEE) (N/A)  POD 1 She is hemodynamically stable in sinus rhythm. ECG normal. Her main complaint is of burning chest pain particularly when laying down. Suspect this is reflux and possibly esophagitis although I am surprised that it is not resolved with her PPI, H2 blocker and sucralfate. Her last UGI in July 2019 showed a recurrent hiatal hernia which appeared to be paraesophageal with minimal reflux so maybe it is the paraesophageal component that is giving her the pain. Will continue to observe and may need to get general surgery to see her while she is here if this persists.  Continue Plavix for valve. Allergic to ASA.  2D echo today.  Ambulate  She lives alone and will not have anyone to stay with her until Friday.   LOS: 1 day    Alleen BorneBryan K  05/31/2018

## 2018-05-31 NOTE — Progress Notes (Signed)
CARDIAC REHAB PHASE I   PRE:  Rate/Rhythm: 68 SR  BP:  Supine: 142/76  Sitting:   Standing:    SaO2: 97%RA  MODE:  Ambulation: 370 ft   POST:  Rate/Rhythm: 82 SR  BP:  Supine:   Sitting: 169/83  Standing:    SaO2: 95%RA 1100-1123 Third walk today. Pt walked 370 ft on RA with hand held asst . A little wobbly at times. To recliner after walk with call bell. Visitors in room.    Luetta Nutting, RN BSN  05/31/2018 11:17 AM

## 2018-05-31 NOTE — Progress Notes (Signed)
  Echocardiogram 2D Echocardiogram has been performed.  Janalyn Harder 05/31/2018, 10:12 AM

## 2018-06-01 ENCOUNTER — Encounter: Payer: Self-pay | Admitting: Thoracic Surgery (Cardiothoracic Vascular Surgery)

## 2018-06-01 LAB — BASIC METABOLIC PANEL
ANION GAP: 10 (ref 5–15)
BUN: 5 mg/dL — ABNORMAL LOW (ref 8–23)
CO2: 28 mmol/L (ref 22–32)
Calcium: 9.1 mg/dL (ref 8.9–10.3)
Chloride: 101 mmol/L (ref 98–111)
Creatinine, Ser: 0.81 mg/dL (ref 0.44–1.00)
GFR calc Af Amer: >60 mL/min (ref >60)
GFR calc non Af Amer: >60 mL/min (ref >60)
GLUCOSE: 102 mg/dL — AB (ref 70–99)
Potassium: 3.4 mmol/L — ABNORMAL LOW (ref 3.5–5.1)
Sodium: 139 mmol/L (ref 135–145)

## 2018-06-01 LAB — CBC
HCT: 33.7 % — ABNORMAL LOW (ref 36.0–46.0)
Hemoglobin: 10.8 g/dL — ABNORMAL LOW (ref 12.0–15.0)
MCH: 27.1 pg (ref 26.0–34.0)
MCHC: 32 g/dL (ref 30.0–36.0)
MCV: 84.5 fL (ref 80.0–100.0)
NRBC: 0 % (ref 0.0–0.2)
Platelets: 99 10*3/uL — ABNORMAL LOW (ref 150–400)
RBC: 3.99 MIL/uL (ref 3.87–5.11)
RDW: 21.3 % — ABNORMAL HIGH (ref 11.5–15.5)
WBC: 5.1 10*3/uL (ref 4.0–10.5)

## 2018-06-01 MED ORDER — CLOPIDOGREL BISULFATE 75 MG PO TABS: 75.0000 mg | ORAL_TABLET | Freq: Every day | ORAL | 1 refills | Status: DC

## 2018-06-01 MED ORDER — POTASSIUM CHLORIDE ER 10 MEQ PO TBCR: 10.0000 meq | EXTENDED_RELEASE_TABLET | Freq: Every day | ORAL | 6 refills | Status: DC

## 2018-06-01 MED ORDER — PANTOPRAZOLE SODIUM 40 MG PO TBEC
40.0000 mg | DELAYED_RELEASE_TABLET | Freq: Two times a day (BID) | ORAL | Status: DC
  Administered 2018-06-01 – 2018-06-02 (×2): 40 mg via ORAL
  Filled 2018-06-01 (×2): qty 1

## 2018-06-01 NOTE — Care Management Note (Addendum)
Case Management Note Donn Pierini  RN, BSN Transitions of Care Unit 4E- RN Case Manager [phone]  Patient Details  Name: Sherri DixonMattie M Mcmillan MRN: [id number] Date of Birth: 05-03-1932  Subjective/Objective:   Pt admitted s/p TAVR                 Action/Plan: PTA pt lived at home alone, per Cardiac rehab recommendation for HHPT- order has been placed- CM spoke with pt at bedside- choice offered Per CMS guidelines from medicare.gov website with star ratings (copy placed in shadow chart) with list provided- [facility] address, phone # and PCP confirmed with pt in epic- [facility]- referral has been accepted. CM discussed with pt transition plan/needs, pt reports that she has a RW at home if she needs it, has a friend [name] that helps her as a "caregiver"  however he does not drive, and also her Honor Junesastor Brenda - per pt her Renato Gailsastor can not come and get her today due to the inclement weather conditions but can come get her first thing in the AM- [name] had stated he would try to come get her in a cab but CM and bedside RN feels safest plan would be to wait and let Steward DroneBrenda provide transportation in the AM- which pt feels most comfortable with. [facility] to confirm discharge plans for the AM, msg left and await return call. Pt is aware that she has been discharged and if Steward DroneBrenda unable to get her in am then transportation with be arranged for her.   Expected Discharge Date:  06/01/18               Expected Discharge Plan:  Home w Home Health Services  In-House Referral:  NA  Discharge planning Services  CM Consult  Post Acute Care Choice:  Home Health Choice offered to:  Patient  DME Arranged:    DME Agency:     HH Arranged:  PT, RN HH Agency:  [facility]  Status of Service:  Completed, signed off  If discussed at Long Length of Stay Meetings, dates discussed:    Discharge Disposition:  home/home health   Additional Comments:  Darrold Span,  Hall, RN 06/01/2018, 3:48 PM

## 2018-06-01 NOTE — Progress Notes (Signed)
CARDIAC REHAB PHASE I   PRE:  Rate/Rhythm: 84 SR  BP:  Supine:   Sitting: 136/71  Standing:    SaO2: 94%RA  MODE:  Ambulation: 370 ft   POST:  Rate/Rhythm: 108 ST  BP:  Supine:   Sitting: 156/80  Standing:    SaO2: 94%RA 1304-1342 Pt walked 370 ft on RA with rolling walker and asst x 2. Pt stated her right calf was hurting some and she buckled her leg twice. She stated she has walker at home and someone will be there to assist her. Encouraged her to walk with someone while her right leg is weak. Would recommend home PT to help with ambulation and home safety eval. Discussed CRP 2 and will refer to [place].   Luetta Nutting, RN BSN  06/01/2018 1:38 PM

## 2018-06-01 NOTE — Discharge Summary (Addendum)
[facility]   MULTIDISCIPLINARY HEART VALVE TEAM  Discharge Summary    Patient ID: Sherri Mcmillan MRN: [id number]; DOB: 1932/06/04  Admit date: 05/30/2018 Discharge date: 06/01/2018  Primary Care Provider: Levora Dredge, MD  Primary Cardiologist: Dr. Cristal Deer / Dr. Clifton James & Dr. Laneta Simmers (TAVR)  Discharge Diagnoses    Principal Problem:   S/P TAVR (transcatheter aortic valve replacement) Active Problems:   Hyperlipidemia   HTN (hypertension)   GERD (gastroesophageal reflux disease)   Hiatal hernia   S/P Nissen fundoplication (without gastrostomy tube) procedure   Chronic bronchitis (HCC)   Iron deficiency anemia due to chronic blood loss   Severe aortic stenosis   Barrett's esophagus   Allergies Allergies  Allergen Reactions  . Aspirin Rash    Diagnostic Studies/Procedures    TAVR OPERATIVE NOTE   Date of Procedure:05/30/2018  Preoperative Diagnosis:Severe Aortic Stenosis   Postoperative Diagnosis:Same   Procedure:   Transcatheter Aortic Valve Replacement - PercutaneousRightTransfemoral Approach Medtronic Evolut PRO +(size 29mm, serial # [id number])  Co-Surgeons:Bryan Jennefer Bravo, MD and Verne Carrow, MD  c  Anesthesiologist:R. Bradley Ferris, MD  Lynn Ito, MD  Pre-operative Echo Findings: ? Severe aortic stenosis ? Normalleft ventricular systolic function  Post-operative Echo Findings: ? Trivialparavalvular leak ? Normalleft ventricular systolic function  _______________  Echo 05/31/18 IMPRESSIONS 1. The left ventricle has hyperdynamic systolic function of >65%. The cavity size is normal. There is no increased left ventricular wall thickness. Echo evidence of impaired diastolic relaxation. 2. The right ventricle has normal systolic function. The cavity in normal in size. There is no increase in  right ventricular wall thickness. 3. Mildly dilated left atrial size. 4. A 26 a Medtronic CoreValve-Evolut Pro bioprosthetic aortic valve valve is present in the aortic position. Normal aortic valve prosthesis. 5. Normal LV systolic function; mild diastolic dysfunction; s/p TAVR with trace AI and mean gradient 10 mmHg; mild LAE; mild TR with mild pulmonary hypertension.   History of Present Illness     Sherri Mcmillan is a 85 y.o. female with a history of HTN, HLD, anxiety/depression with previous admissions for drug overdose and multiple somatic complaints, hiatal hernia with GERD/reflux esophagitis/Barrett's esophagus s/p failed laparoscopic Nissen fundoplication, and severe AS who presented to [facility] on [facility]/30/18 for planned TAVR.   She apparently had a failure of the fundoplication and has had recurrent severe reflux causing esophagitis with burning pain in her chest, regurgitation, wheezing and shortness of breath particularly when lying down. She was being evaluated for further surgical treatment of her hiatal hernia and had a preoperative cardiac evaluation.  A repeat echocardiogram on 02/24/2018 showed a left ventricular ejection fraction of 60 to 65% with grade 2 diastolic dysfunction.  The aortic valve was thickened and calcified with restricted mobility and a mean gradient of 40 mmHg, aortic valve area of 0.9 cm, and dimensionless index of 0.24 consistent with severe aortic stenosis.  She was seen by Dr. Excell Seltzer for consideration of TAVR and underwent cardiac catheterization on 04/06/2018 which showed no coronary artery disease.  The mean gradient across aortic valve was 38 with a peak to peak gradient of 43 mmHg.  The patient has been evaluated by the multidisciplinary valve team and felt to have severe, symptomatic aortic stenosis and to be a suitable candidate for TAVR, which was set up for 05/30/18.    Hospital Course     Consultants: general surgery   Severe AS: s/p successful TAVR with  a 26 mm Medtronic Pro Plus via the TF  approach on 05/30/18. Post operative echo showed EF 65%, normally functioning TAVR valve with a mean gradient of 10 mm Hg and trace AI. Groin sites are stable. ECG with NSR and no high grade heart block. Continue monotherapy with plavix given an aspirin allergy.  HTN: BP starting to creep up. Resume home amlodipine and chlorthalidone. K mildly low will start K supplementation. Recheck BMET next week  GERD/reflux esophagitis/Barrett's esophagus: s/p failed laparoscopic Nissen fundoplication. She was seeking cardiac clearance for redo surgery when she was found to have severe AS. She has been having a lot of chest pain felt to be related to reflux that has not resolved with IV PPI, H2 blocker and sucralfate. Her last UGI in July 2019 showed a recurrent hiatal hernia which appeared to be paraesophageal with minimal reflux so maybe it is the paraesophageal component that is giving her the pain. We consulted general surgery who recommended outpatient follow up with Dr. Daphine DeutscherMartin and increase PPI to BID.   Dispo: she is feeling much better now and has a ride home. She has been up walking with cardiac rehab and did well. Cardiac rehab recommends home health PT. Per nursing staff she would like to go home today and has a ride.   _____________  Discharge Vitals Blood pressure (!) 148/78, pulse 80, temperature 98.6 F (37 C), temperature source Oral, resp. rate 18, height 5' (1.524 m), weight 52.9 kg, last menstrual period 04/26/1950, SpO2 94 %.  Filed Weights   05/30/18 0555 05/31/18 0326 06/01/18 0300  Weight: 53.6 kg 54.1 kg 52.9 kg    Labs & Radiologic Studies    CBC Recent Labs    05/31/18 0341 06/01/18 0221  WBC 6.0 5.1  HGB 10.8* 10.8*  HCT 35.1* 33.7*  MCV 84.8 84.5  PLT 121* 99*   Basic Metabolic Panel Recent Labs    16/01/9601/05/20 0341 06/01/18 0221  NA 137 139  K 3.6 3.4*  CL 103 101  CO2 25 28  GLUCOSE 116* 102*  BUN 11 5*  CREATININE 0.89 0.81   CALCIUM 9.1 9.1  MG 1.8  --    Liver Function Tests No results for input(s): AST, ALT, ALKPHOS, BILITOT, PROT, ALBUMIN in the last 72 hours. No results for input(s): LIPASE, AMYLASE in the last 72 hours. Cardiac Enzymes No results for input(s): CKTOTAL, CKMB, CKMBINDEX, TROPONINI in the last 72 hours. BNP Invalid input(s): POCBNP D-Dimer No results for input(s): DDIMER in the last 72 hours. Hemoglobin A1C No results for input(s): HGBA1C in the last 72 hours. Fasting Lipid Panel No results for input(s): CHOL, HDL, LDLCALC, TRIG, CHOLHDL, LDLDIRECT in the last 72 hours. Thyroid Function Tests No results for input(s): TSH, T4TOTAL, T3FREE, THYROIDAB in the last 72 hours.  Invalid input(s): FREET3 _____________  Dg Chest 2 View  Result Date: 05/26/2018 CLINICAL DATA:  Preop EXAM: CHEST - 2 VIEW COMPARISON:  10/30/2016 FINDINGS: Small hiatal hernia. Heart and mediastinal contours are within normal limits. No focal opacities or effusions. No acute bony abnormality. IMPRESSION: No active cardiopulmonary disease. Electronically Signed   By: Charlett NoseKevin  Dover M.D.   On: 05/26/2018 16:42   Dg Chest Port 1 View  Result Date: 05/30/2018 CLINICAL DATA:  Post TARV. EXAM: PORTABLE CHEST - 1 VIEW COMPARISON:  05/26/2018 FINDINGS: Right IJ central line extends to the cavoatrial junction. No pneumothorax. Changes of TAVR. Lungs clear. Heart size and mediastinal contours are within normal limits. Aortic Atherosclerosis (ICD10-170.0). No effusion. Visualized bones unremarkable. IMPRESSION: No acute cardiopulmonary disease. Electronically  Signed   By: Corlis Leak M.D.   On: 05/30/2018 12:11   Disposition   Pt is being discharged home today in good condition.  Follow-up Plans & Appointments    Follow-up Information    Janetta Hora, PA-C. Go on 06/08/2018.   Specialties:  Cardiology, Radiology Why:  @ 2:30pm, please arrive at least 10 minutes early  Contact information: [address] [place] [address] [phone]        Luretha Murphy, MD. Schedule an appointment as soon as possible for a visit in 2 week(s).   Specialty:  General Surgery Why:  after discharge from the hospital to discuss your hiatal hernia  Contact information: [address] [place] [address] [phone]          Discharge Instructions    Amb Referral to Cardiac Rehabilitation   Complete by:  As directed    Diagnosis:  Valve Replacement   Valve:  Aortic Comment - TAVR      Discharge Medications   Allergies as of 06/01/2018      Reactions   Aspirin Rash      Medication List    TAKE these medications   acetaminophen 500 MG tablet Commonly known as:  TYLENOL Take 1,000 mg by mouth every 6 (six) hours as needed.   amLODipine 10 MG tablet Commonly known as:  NORVASC TAKE 1 TABLET BY MOUTH EVERY DAY   buPROPion 150 MG 24 hr tablet Commonly known as:  WELLBUTRIN XL TAKE 1 TABLET BY MOUTH EVERY DAY   chlorthalidone 25 MG tablet Commonly known as:  HYGROTON Take 1 tablet (25 mg total) by mouth daily.   clopidogrel 75 MG tablet Commonly known as:  PLAVIX Take 1 tablet (75 mg total) by mouth daily with breakfast. Start taking on:  June 02, 2018   [employer] MELATONIN 10 MG Caps Generic drug:  Melatonin TAKE 10 MG BY MOUTH AT BEDTIME. What changed:  See the new instructions.   dexlansoprazole 60 MG capsule Commonly known as:  DEXILANT Take 1 capsule (60 mg total) by mouth 2 (two) times daily.   hydroxypropyl methylcellulose / hypromellose 2.5 % ophthalmic solution Commonly known as:  ISOPTO TEARS / GONIOVISC Place 1 drop into both eyes daily as needed for dry eyes.   potassium chloride 10 MEQ tablet Commonly known as:  K-DUR Take 1 tablet (10 mEq total) by mouth daily.   PROAIR HFA 108 (90 Base) MCG/ACT inhaler Generic drug:  albuterol INHALE 2 PUFFS INTO THE LUNGS EVERY 4 (FOUR) HOURS AS NEEDED FOR WHEEZING OR SHORTNESS OF BREATH. What  changed:  See the new instructions.   ranitidine 300 MG tablet Commonly known as:  ZANTAC Take 1 tablet (300 mg total) by mouth at bedtime.   sucralfate 1 GM/10ML suspension Commonly known as:  CARAFATE TAKE 10 MLS (1 G TOTAL) BY MOUTH 2 (TWO) TIMES DAILY. What changed:    how much to take  how to take this  when to take this           Outstanding Labs/Studies   BMET  Duration of Discharge Encounter   Greater than 30 minutes including physician time.  SignedCline Crock, PA-C 06/01/2018, 2:40 PM [phone]

## 2018-06-01 NOTE — Progress Notes (Signed)
Spoke with PA Barrett ref to pt not being able to leave tonight due to weather and ride. Pt will have a ride first thing in the morning Sherri Mcmillan

## 2018-06-01 NOTE — Discharge Instructions (Signed)
ACTIVITY AND EXERCISE °• Daily activity and exercise are an important part of your recovery. People recover at different rates depending on their general health and type of valve procedure. °• Most people recovering from TAVR feel better relatively quickly  °• No lifting, pushing, pulling more than 10 pounds (examples to avoid: groceries, vacuuming, gardening, golfing): °            - For one week with a procedure through the groin. °            - For six weeks for procedures through the chest wall or neck °NOTE: You will typically see one of our providers 7-14 days after your procedure to discuss WHEN TO RESUME the above activities.  °  °  °DRIVING °• Do not drive for until you are seen for follow up and cleared by a provider. Generally, we ask patient to not drive for 1 week after their procedure. °• If you have been told by your doctor in the past that you may not drive, you must talk with him/her before you begin driving again. °  °  °DRESSING °• Groin site: you may leave the clear dressing over the site for up to one week or until it falls off. °  °  °HYGIENE °• If you had a femoral (leg) procedure, you may take a shower when you return home. After the shower, pat the site dry. Do NOT use powder, oils or lotions in your groin area until the site has completely healed. °• If you had a chest procedure, you may shower when you return home unless specifically instructed not to by your discharging practitioner. °            - DO NOT scrub incision; pat dry with a towel °            - DO NOT apply any lotions, oils, powders to the incision °            - No tub baths / swimming for at least 2 weeks. °• If you notice any fevers, chills, increased pain, swelling, bleeding or pus, please contact your doctor. °  °ADDITIONAL INFORMATION °• If you are going to have an upcoming dental procedure, please contact our office as you will require antibiotics ahead of time to prevent infection on your heart valve.  ° ° °If you  have any questions or concerns you can call the structural heart phone during normal business hours 8am-4pm. If you have an urgent need after hours or weekends please call [phone] to talk to the on call provider for general cardiology. If you have an emergency that requires immediate attention, please call 911.  ° ° °After TAVR Checklist ° °Check  Test Description  ° Follow up appointment in 1-2 weeks  You will see our structural heart physician assistant, Katie Thompson. Your incision sites will be checked and you will be cleared to drive and resume all normal activities if you are doing well.    ° 1 month echo and follow up  You will have an echo to check on your new heart valve and be seen back in the office by Katie Thompson. Many times the echo is not read by your appointment time, but Katie will call you later that day or the following day to report your results.  ° Follow up with your primary cardiologist You will need to be seen by your primary cardiologist in the following 3-6 months after your 1   month appointment in the valve clinic. Often times your Plavix or Aspirin will be discontinued during this time, but this is decided on a case by case basis.    1 year echo and follow up You will have another echo to check on your heart valve after 1 year and be seen back in the office by Carlean Jews. This your last structural heart visit.   Bacterial endocarditis prophylaxis  You will have to take antibiotics for the rest of your life before all dental procedures (even teeth cleanings) to protect your heart valve. Antibiotics are also required before some surgeries. Please check with your cardiologist before scheduling any surgeries. Also, please make sure to tell us if you have a penicillin allergy as you will require an alternative antibiotic.       Hiatal Hernia  A hiatal hernia occurs when part of the stomach slides above the muscle that separates the abdomen from the chest (diaphragm). A  person can be born with a hiatal hernia (congenital), or it may develop over time. In almost all cases of hiatal hernia, only the top part of the stomach pushes through the diaphragm. Many people have a hiatal hernia with no symptoms. The larger the hernia, the more likely it is that you will have symptoms. In some cases, a hiatal hernia allows stomach acid to flow back into the tube that carries food from your mouth to your stomach (esophagus). This may cause heartburn symptoms. Severe heartburn symptoms may mean that you have developed a condition called gastroesophageal reflux disease (GERD). What are the causes? This condition is caused by a weakness in the opening (hiatus) where the esophagus passes through the diaphragm to attach to the upper part of the stomach. A person may be born with a weakness in the hiatus, or a weakness can develop over time. What increases the risk? This condition is more likely to develop in:  Older people. Age is a major risk factor for a hiatal hernia, especially if you are over the age of 63.  Pregnant women.  People who are overweight.  People who have frequent constipation. What are the signs or symptoms? Symptoms of this condition usually develop in the form of GERD symptoms. Symptoms include:  Heartburn.  Belching.  Indigestion.  Trouble swallowing.  Coughing or wheezing.  Sore throat.  Hoarseness.  Chest pain.  Nausea and vomiting. How is this diagnosed? This condition may be diagnosed during testing for GERD. Tests that may be done include:  X-rays of your stomach or chest.  An upper gastrointestinal (GI) series. This is an X-ray exam of your GI tract that is taken after you swallow a chalky liquid that shows up clearly on the X-ray.  Endoscopy. This is a procedure to look into your stomach using a thin, flexible tube that has a tiny camera and light on the end of it. How is this treated? This condition may be treated by:  Dietary  and lifestyle changes to help reduce GERD symptoms.  Medicines. These may include: ? Over-the-counter antacids. ? Medicines that make your stomach empty more quickly. ? Medicines that block the production of stomach acid (H2 blockers). ? Stronger medicines to reduce stomach acid (proton pump inhibitors).  Surgery to repair the hernia, if other treatments are not helping. If you have no symptoms, you may not need treatment. Follow these instructions at home: Lifestyle and activity  Do not use any products that contain nicotine or tobacco, such as cigarettes and e-cigarettes. If  you need help quitting, ask your health care provider.  Try to achieve and maintain a healthy body weight.  Avoid putting pressure on your abdomen. Anything that puts pressure on your abdomen increases the amount of acid that may be pushed up into your esophagus. ? Avoid bending over, especially after eating. ? Raise the head of your bed by putting blocks under the legs. This keeps your head and esophagus higher than your stomach. ? Do not wear tight clothing around your chest or stomach. ? Try not to strain when having a bowel movement, when urinating, or when lifting heavy objects. Eating and drinking  Avoid foods that can worsen GERD symptoms. These may include: ? Fatty foods, like fried foods. ? Citrus fruits, like oranges or lemon. ? Other foods and drinks that contain acid, like orange juice or tomatoes. ? Spicy food. ? Chocolate.  Eat frequent small meals instead of three large meals a day. This helps prevent your stomach from getting too full. ? Eat slowly. ? Do not lie down right after eating. ? Do not eat 1-2 hours before bed.  Do not drink beverages with caffeine. These include cola, coffee, cocoa, and tea.  Do not drink alcohol. General instructions  Take over-the-counter and prescription medicines only as told by your health care provider.  Keep all follow-up visits as told by your health  care provider. This is important. Contact a health care provider if:  Your symptoms are not controlled with medicines or lifestyle changes.  You are having trouble swallowing.  You have coughing or wheezing that will not go away. Get help right away if:  Your pain is getting worse.  Your pain spreads to your arms, neck, jaw, teeth, or back.  You have shortness of breath.  You sweat for no reason.  You feel sick to your stomach (nauseous) or you vomit.  You vomit blood.  You have bright red blood in your stools.  You have black, tarry stools. This information is not intended to replace advice given to you by your health care provider. Make sure you discuss any questions you have with your health care provider. Document Released: 07/03/2003 Document Revised: 11/15/2016 Document Reviewed: 11/15/2016 Elsevier Interactive Patient Education  2019 [employer]

## 2018-06-01 NOTE — Progress Notes (Addendum)
[facility]   MULTIDISCIPLINARY HEART VALVE TEAM  Patient Name: Sherri Mcmillan Date of Encounter: 06/01/2018  Primary Cardiologist: Dr. Cristal Deer / Dr. Clifton James & Dr. Laneta Simmers (TAVR)  Hospital Problem List     Principal Problem:   S/P TAVR (transcatheter aortic valve replacement) Active Problems:   Hyperlipidemia   HTN (hypertension)   GERD (gastroesophageal reflux disease)   Hiatal hernia   S/P Nissen fundoplication (without gastrostomy tube) procedure   Chronic bronchitis (HCC)   Iron deficiency anemia due to chronic blood loss   Severe aortic stenosis   Barrett's esophagus     Subjective   Patient crying in room due to severe pain. She says that when she coughs she has burning chest pain as well as a severe headache and back spasms. Asking for more morphine  Inpatient Medications    Scheduled Meds: . amLODipine  10 mg Oral Daily  . buPROPion  150 mg Oral Daily  . clopidogrel  75 mg Oral Q breakfast  . famotidine  40 mg Oral Daily  . pantoprazole  40 mg Oral Daily  . sodium chloride flush  3 mL Intravenous Q12H  . sucralfate  1 g Oral BID   Continuous Infusions: . sodium chloride     PRN Meds: sodium chloride, acetaminophen **OR** acetaminophen, Melatonin, metoprolol tartrate, morphine injection, ondansetron (ZOFRAN) IV, oxyCODONE, sodium chloride flush, traMADol   Vital Signs    Vitals:   05/31/18 1932 05/31/18 2345 06/01/18 0300 06/01/18 0853  BP: (!) 143/72 (!) 154/81 119/81 (!) 148/78  Pulse: 71 77 76 80  Resp: 16 15 (!) 21 18  Temp: 99 F (37.2 C) 99.2 F (37.3 C) 98.8 F (37.1 C) 98.6 F (37 C)  TempSrc: Oral Oral Oral Oral  SpO2: 96% 94% 93% 94%  Weight:   52.9 kg   Height:        Intake/Output Summary (Last 24 hours) at 06/01/2018 0941 Last data filed at 06/01/2018 0511 Gross per 24 hour  Intake 400 ml  Output 700 ml  Net -300 ml   Filed Weights   05/30/18 0555 05/31/18 0326 06/01/18 0300  Weight: 53.6 kg 54.1 kg 52.9 kg     Physical Exam   GEN: appears uncomfortable and in distress.  HEENT: Grossly normal.  Neck: Supple, no JVD, carotid bruits, or masses. Cardiac: RRR, no murmurs, rubs, or gallops. No clubbing, cyanosis, edema.  Radials/DP/PT 2+ and equal bilaterally.  Respiratory:  Respirations regular and unlabored, clear to auscultation bilaterally. GI: Soft, nontender, nondistended, BS + x 4. MS: no deformity or atrophy. Skin: warm and dry, no rash. Groin sites healing well Neuro:  Strength and sensation are intact. Psych: AAOx3.  Normal affect.  Labs    CBC Recent Labs    05/31/18 0341 06/01/18 0221  WBC 6.0 5.1  HGB 10.8* 10.8*  HCT 35.1* 33.7*  MCV 84.8 84.5  PLT 121* 99*   Basic Metabolic Panel Recent Labs    25/42/70 0341 06/01/18 0221  NA 137 139  K 3.6 3.4*  CL 103 101  CO2 25 28  GLUCOSE 116* 102*  BUN 11 5*  CREATININE 0.89 0.81  CALCIUM 9.1 9.1  MG 1.8  --    Liver Function Tests No results for input(s): AST, ALT, ALKPHOS, BILITOT, PROT, ALBUMIN in the last 72 hours. No results for input(s): LIPASE, AMYLASE in the last 72 hours. Cardiac Enzymes No results for input(s): CKTOTAL, CKMB, CKMBINDEX, TROPONINI in the last 72 hours. BNP Invalid input(s):  POCBNP D-Dimer No results for input(s): DDIMER in the last 72 hours. Hemoglobin A1C No results for input(s): HGBA1C in the last 72 hours. Fasting Lipid Panel No results for input(s): CHOL, HDL, LDLCALC, TRIG, CHOLHDL, LDLDIRECT in the last 72 hours. Thyroid Function Tests No results for input(s): TSH, T4TOTAL, T3FREE, THYROIDAB in the last 72 hours.  Invalid input(s): FREET3  Telemetry    sinus - Personally Reviewed  ECG    Sinus - Personally Reviewed  Radiology    Dg Chest Port 1 View  Result Date: 05/30/2018 CLINICAL DATA:  Post TARV. EXAM: PORTABLE CHEST - 1 VIEW COMPARISON:  05/26/2018 FINDINGS: Right IJ central line extends to the cavoatrial junction. No pneumothorax. Changes of TAVR. Lungs clear.  Heart size and mediastinal contours are within normal limits. Aortic Atherosclerosis (ICD10-170.0). No effusion. Visualized bones unremarkable. IMPRESSION: No acute cardiopulmonary disease. Electronically Signed   By: Corlis Leak  Hassell M.D.   On: 05/30/2018 12:11    Cardiac Studies   TAVR OPERATIVE NOTE   Date of Procedure:                05/30/2018  Preoperative Diagnosis:      Severe Aortic Stenosis   Postoperative Diagnosis:    Same   Procedure:        Transcatheter Aortic Valve Replacement - Percutaneous Right Transfemoral Approach             Medtronic Evolut PRO + (size 26 mm, serial # [id number])              Co-Surgeons:                        Alleen BorneBryan K. Bartle, MD and Verne Carrowhristopher McAlhany, MD  c  Anesthesiologist:                  Hyman Bower. Ellender, MD  Echocardiographer:              Eloy EndK. Nelson, MD  Pre-operative Echo Findings: ? Severe aortic stenosis ? Normal left ventricular systolic function  Post-operative Echo Findings: ? Trivial paravalvular leak ? Normal left ventricular systolic function  _______________  05/31/18 IMPRESSIONS  1. The left ventricle has hyperdynamic systolic function of >65%. The cavity size is normal. There is no increased left ventricular wall thickness. Echo evidence of impaired diastolic relaxation.  2. The right ventricle has normal systolic function. The cavity in normal in size. There is no increase in right ventricular wall thickness.  3. Mildly dilated left atrial size.  4. A 26 a Medtronic CoreValve-Evolut Pro bioprosthetic aortic valve valve is present in the aortic position. Normal aortic valve prosthesis.  5. Normal LV systolic function; mild diastolic dysfunction; s/p TAVR with trace AI and mean gradient 10 mmHg; mild LAE; mild TR with mild pulmonary hypertension.   Patient Profile   Sherri Mcmillan is a 85 y.o. female with a history of HTN, HLD, anxiety/depression with previous admissions for drug overdose and multiple somatic  complaints, hiatal hernia with GERD/reflux esophagitis/Barrett's esophagus s/p failed laparoscopic Nissen fundoplication, and severe AS who [facility] for planned TAVR.   Assessment & Plan    Severe AS: s/p successful TAVR with a 26 mm Medtronic Pro Plus via the TF approach on 05/30/18. Post operative echo showed EF 65%, normally functioning TAVR valve with a mean gradient of 10 mm Hg and trace AI. Groin sites are stable. ECG with NSR and no high grade heart block. Continue monotherapy  with plavix given an aspirin allergy.  HTN: BP well controlled today   GERD/reflux esophagitis/Barrett's esophagus: s/p failed laparoscopic Nissen fundoplication. She was seeking cardiac clearance for redo surgery when she was found to have severe AS. She has been having a lot of chest pain felt to be related to reflux that has not resolved with IV PPI, H2 blocker and sucralfate. Her last UGI in July 2019 showed a recurrent hiatal hernia which appeared to be paraesophageal with minimal reflux so maybe it is the paraesophageal component that is giving her the pain. Dr. Laneta Simmers has asked me to call general surgery to have them weigh in.  SignedCline Crock, PA-C  06/01/2018, 9:41 AM  Pager [phone]

## 2018-06-01 NOTE — Progress Notes (Signed)
Pt ambulated 200 feet with front wheel walker, tolerated well placed in recliner

## 2018-06-01 NOTE — Plan of Care (Signed)
  Problem: Education: Goal: Knowledge of General Education information will improve Description Including pain rating scale, medication(s)/side effects and non-pharmacologic comfort measures Outcome: Progressing   

## 2018-06-01 NOTE — Consult Note (Signed)
[facility] Consult/Admission Note  Sherri Mcmillan November 24, 1932  [id number].    Requesting MD: Dr. Laneta Simmers Chief Complaint/Reason for Consult: Hiatal hernia  HPI:   Pt is an 85 yo female with a hx of HTN, GERD, hiatal hernia s/p Nissen 2015 Dr. Daphine Deutscher, barretts esophagus who is S/P TAVR 02/04 for severe AS. Pt is complaining of chest burning from her hiatal hernia and we were asked to see. Pt was seen by Dr. Daphine Deutscher for repair of recurrent hiatal hernia in July 2019 and he requested cardiac clearance. She was found to have severe AS. Pt states her symptoms are the same as they have been since her visit with Dr. Daphine Deutscher last July. She states sometimes the symptoms get better but they are worse today. She is complaining of sharp, stabbing, severe pain in her epigastric/mid chest region that radiates around her left side into her back. Pain worse with cough. She states mid chest burning with some liquids. She is tolerating a diet and having bowel function. She had nausea and vomiting yesterday but none today. No other associated symptoms. She is upset with her care from nursing staff. I discussed this with the charge nurse.   ROS:  Review of Systems  Constitutional: Negative for chills, diaphoresis and fever.  HENT: Negative for sore throat.   Respiratory: Negative for cough and shortness of breath.   Cardiovascular: Positive for chest pain.  Gastrointestinal: Positive for abdominal pain, heartburn, nausea and vomiting. Negative for blood in stool, constipation and diarrhea.  Genitourinary: Negative for dysuria.  Skin: Negative for rash.  Neurological: Negative for dizziness and loss of consciousness.  All other systems reviewed and are negative.    Family History  Problem Relation Age of Onset  . Heart disease Mother   . Hypertension Mother   . Heart attack Mother   . Hypertension Father   . Heart attack Father   . Diabetes Brother   . Stroke Neg Hx   . Cancer Neg Hx   .  Colon cancer Neg Hx   . Anesthesia problems Neg Hx   . Hypotension Neg Hx   . Malignant hyperthermia Neg Hx   . Pseudochol deficiency Neg Hx   . Stomach cancer Neg Hx   . Rectal cancer Neg Hx   . Esophageal cancer Neg Hx     Past Medical History:  Diagnosis Date  . Anemia 12/14/2012  . Ataxia 06/24/2016  . Auditory hallucinations 08/19/2010   Has has auditory hallucinations, which seem to have worsened since death of her sister January 18, 2010. Admission to [facility] (90s or early 2000)  for 6 weeks after mother died, also with hallucinations.  B/C of severe mental illness and refusal to see pysch, I have refused to refill controlled meds. If she decides to pursue mental health, then she needs to take the initiative and sch the appt bc [name] has s  . Barrett's esophagus    Demonstrated on EGD 2011/01/19. EGD 09/17/2012 shows inflamed GE junctional mucosa without metaplasia, dysplasia, or malignancy.  . Cataract   . Chronic anxiety    Admission to [facility] (90s or early 2000)  for 6 weeks after mother died. Complicated again by death of her sister 2010. 2012 developed hallucinations and I refused to refill controlled meds unless she see psych which she is not agreable to  . Chronic insomnia   . Depression   . DVT (deep venous thrombosis) (HCC)   . Gastroparesis    Demonstrated on GES 01/19/2011  by Dr Dalene Seltzer  . GERD (gastroesophageal reflux disease)    describes " I sometimes have a hard time talking or swallowing" - relative to the reflux  . H/O hiatal hernia   . Hiatal hernia   . HLD (hyperlipidemia) 01/2010  . HTN (hypertension)    Controlled with 2 drug therapy  . Left leg DVT (HCC) 09/28/2012  . Migraine   . 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 Severe aortic stenosis     Past Surgical History:  Procedure Laterality Date  . 24 HOUR PH STUDY N/A 08/24/2017   Procedure: 24 HOUR PH STUDY IMPEDANCE;  Surgeon: Sherrilyn Rist, MD;  Location: [facility] ENDOSCOPY;  Service: Gastroenterology;  Laterality: N/A;  . ABDOMINAL HYSTERECTOMY    . APPENDECTOMY    . CARDIAC CATHETERIZATION    . CATARACT EXTRACTION     left eye  . COLONOSCOPY  2000&2005   Dr.Magod  . DIRECT LARYNGOSCOPY   September 2008    preoperative diagnosis hoarseness with anterior right vocal cord lesion -  direct laryngoscopy and excisional biopsy of right anterior vocal cord lesion done by Dr. Ezzard Standing  . ENDOVENOUS ABLATION SAPHENOUS VEIN W/ LASER Left 05-09-2014   EVLA left small saphenous vein by Gretta Began MD  . ESOPHAGEAL MANOMETRY N/A 08/24/2017   Procedure: ESOPHAGEAL MANOMETRY (EM);  Surgeon: Sherrilyn Rist, MD;  Location: [facility] ENDOSCOPY;  Service: Gastroenterology;  Laterality: N/A;  . ESOPHAGOGASTRODUODENOSCOPY  11/2010  . ESOPHAGOGASTRODUODENOSCOPY N/A 09/17/2012   Procedure: ESOPHAGOGASTRODUODENOSCOPY (EGD);  Surgeon: Hart Carwin, MD;  Location: [facility] ENDOSCOPY;  Service: Endoscopy;  Laterality: N/A;  . ESOPHAGOGASTRODUODENOSCOPY N/A 12/17/2012   Procedure: ESOPHAGOGASTRODUODENOSCOPY (EGD);  Surgeon: Beverley Fiedler, MD;  Location: [facility] ENDOSCOPY;  Service: Gastroenterology;  Laterality: N/A;  . EXCISION MASS HEAD N/A 03/05/2016   Procedure: EXCISION POSTERIOR SCALP MASS;  Surgeon: Axel Filler, MD;  Location: [facility] OR;  Service: General;  Laterality: N/A;  . EYE SURGERY Bilateral    cataracts removed-   . HEMORRHOID SURGERY    . KNEE ARTHROSCOPY    . LAPAROSCOPIC NISSEN FUNDOPLICATION N/A 05/02/2013   Procedure: LAPAROSCOPIC NISSEN FUNDOPLICATION;  Surgeon: Valarie Merino, MD;  Location: [facility] ORS;  Service: General;  Laterality: N/A;  . MENISCECTOMY   July 2002    preoperative diagnosis torn medial meniscus right knee, partial medial meniscectomy, debridement chondroplasty patellofemoral joint, done by Dr. Madelon Lips  . POLYPECTOMY  2000   Dr.Magod  . RIGHT/LEFT HEART CATH AND CORONARY ANGIOGRAPHY N/A 04/06/2018   Procedure: RIGHT/LEFT HEART  CATH AND CORONARY ANGIOGRAPHY;  Surgeon: Kathleene Hazel, MD;  Location: [facility] INVASIVE CV LAB;  Service: Cardiovascular;  Laterality: N/A;  . ROTATOR CUFF REPAIR  09/21/2011   rt shoulder  . TEE WITHOUT CARDIOVERSION N/A 05/30/2018   Procedure: TRANSESOPHAGEAL ECHOCARDIOGRAM (TEE);  Surgeon: Kathleene Hazel, MD;  Location: [facility] OR;  Service: Open Heart Surgery;  Laterality: N/A;  . TOOTH EXTRACTION Bilateral 04/05/2018   Procedure: Extraction of tooth #'s 6 and 29 with alveoloplasty and gross debridement of remaining teeth;  Surgeon: Charlynne Pander, DDS;  Location: [facility] OR;  Service: Oral Surgery;  Laterality: Bilateral;  . TRANSCATHETER AORTIC VALVE REPLACEMENT, TRANSFEMORAL N/A 05/30/2018   Procedure: TRANSCATHETER AORTIC VALVE REPLACEMENT, TRANSFEMORAL - MEDTRONIC VALVE;  Surgeon: Kathleene Hazel, MD;  Location: [facility] OR;  Service: Open Heart Surgery;  Laterality: N/A;    Social History:  reports that she quit smoking about 35 years ago. Her smoking use included cigarettes. She quit after 50.00 years of use. She has never used smokeless tobacco. She reports that she does not drink alcohol or use drugs.  Allergies:  Allergies  Allergen Reactions  . Aspirin Rash    Medications Prior to Admission  Medication Sig Dispense Refill  . acetaminophen (TYLENOL) 500 MG tablet Take 1,000 mg by mouth every 6 (six) hours as needed.    Marland Kitchen. amLODipine (NORVASC) 10 MG tablet TAKE 1 TABLET BY MOUTH EVERY DAY (Patient taking differently: Take 10 mg by mouth daily. ) 90 tablet 2  . [employer]S MELATONIN 10 MG CAPS TAKE 10 MG BY MOUTH AT BEDTIME. (Patient taking differently: Take 10 mg by mouth at bedtime as needed (sleep). ) 90 capsule 1  . dexlansoprazole (DEXILANT) 60 MG capsule Take 1 capsule (60 mg total) by mouth 2 (two) times daily. 180 capsule   . hydroxypropyl methylcellulose / hypromellose (ISOPTO TEARS / GONIOVISC) 2.5 % ophthalmic solution Place 1 drop into both eyes daily as needed for dry  eyes.    Marland Kitchen. PROAIR HFA 108 (90 Base) MCG/ACT inhaler INHALE 2 PUFFS INTO THE LUNGS EVERY 4 (FOUR) HOURS AS NEEDED FOR WHEEZING OR SHORTNESS OF BREATH. (Patient taking differently: Inhale 2 puffs into the lungs every 4 (four) hours as needed for wheezing or shortness of breath. ) 8.5 Inhaler 3  . sucralfate (CARAFATE) 1 GM/10ML suspension TAKE 10 MLS (1 G TOTAL) BY MOUTH 2 (TWO) TIMES DAILY. (Patient taking differently: Take 1 g by mouth 2 (two) times daily. TAKE 10 MLS (1 G TOTAL) BY MOUTH 2 (TWO) TIMES DAILY.) 420 mL 1  . chlorthalidone (HYGROTON) 25 MG tablet Take 1 tablet (25 mg total) by mouth daily. (Patient not taking: Reported on 05/17/2018) 90 tablet 0  . ranitidine (ZANTAC) 300 MG tablet Take 1 tablet (300 mg total) by mouth at bedtime. 90 tablet 1    Blood pressure (!) 148/78, pulse 80, temperature 98.6 F (37 C), temperature source Oral, resp. rate 18, height 5' (1.524 m), weight 52.9 kg, last menstrual period 04/26/1950, SpO2 94 %.  Physical Exam Vitals signs and nursing note reviewed.  Constitutional:      General: She is not in acute distress.    Appearance: Normal appearance. She is not diaphoretic.  HENT:     Head: Normocephalic and atraumatic.     Nose: Nose normal.     Mouth/Throat:     Lips: Pink.     Mouth: Mucous membranes are moist.  Eyes:     General: No scleral icterus.       Right eye: No discharge.        Left eye: No discharge.     Conjunctiva/sclera: Conjunctivae normal.     Comments: Pupils are equal and round  Neck:     Musculoskeletal: Normal range of motion and neck supple.  Cardiovascular:     Rate and Rhythm: Normal rate and regular rhythm.     Pulses:          Radial pulses are 2+ on the right side and 2+ on the left side.     Heart sounds: S1 normal and S2 normal.  Pulmonary:     Effort: Pulmonary effort is normal. No respiratory distress.     Breath sounds: Normal breath sounds. No wheezing, rhonchi or rales.  Abdominal:     General: Bowel  sounds are normal. There is no distension.  Palpations: Abdomen is soft. Abdomen is not rigid.     Tenderness: There is abdominal tenderness in the epigastric area. There is no guarding.  Musculoskeletal: Normal range of motion.        General: No tenderness or deformity.  Skin:    General: Skin is warm and dry.     Findings: No rash.  Neurological:     Mental Status: She is alert and oriented to person, place, and time.  Psychiatric:        Mood and Affect: Affect is tearful.        Speech: Speech normal.        Behavior: Behavior normal.     Results for orders placed or performed during the hospital encounter of 05/30/18 (from the past 48 hour(s))  Glucose, capillary     Status: Abnormal   Collection Time: 05/30/18 10:13 PM  Result Value Ref Range   Glucose-Capillary 123 (H) 70 - 99 mg/dL  CBC     Status: Abnormal   Collection Time: 05/31/18  3:41 AM  Result Value Ref Range   WBC 6.0 4.0 - 10.5 K/uL    Comment: REPEATED TO VERIFY   RBC 4.14 3.87 - 5.11 MIL/uL   Hemoglobin 10.8 (L) 12.0 - 15.0 g/dL   HCT 16.135.1 (L) 09.636.0 - 04.546.0 %   MCV 84.8 80.0 - 100.0 fL   MCH 26.1 26.0 - 34.0 pg   MCHC 30.8 30.0 - 36.0 g/dL   RDW 40.921.6 (H) 81.111.5 - 91.415.5 %   Platelets 121 (L) 150 - 400 K/uL   nRBC 0.0 0.0 - 0.2 %    Comment: Performed at [facility], [address] [place], [place] [address]  Basic metabolic panel     Status: Abnormal   Collection Time: 05/31/18  3:41 AM  Result Value Ref Range   Sodium 137 135 - 145 mmol/L   Potassium 3.6 3.5 - 5.1 mmol/L   Chloride 103 98 - 111 mmol/L   CO2 25 22 - 32 mmol/L   Glucose, Bld 116 (H) 70 - 99 mg/dL   BUN 11 8 - 23 mg/dL   Creatinine, Ser 6.210.89 0.44 - 1.00 mg/dL   Calcium 9.1 8.9 - 30.810.3 mg/dL   GFR calc non Af Amer 60 (L) >60 mL/min   GFR calc Af Amer >60 >60 mL/min   Anion gap 9 5 - 15    Comment: Performed at [facility], [address] [place], [place] [address]  Magnesium     Status: None   Collection Time:  05/31/18  3:41 AM  Result Value Ref Range   Magnesium 1.8 1.7 - 2.4 mg/dL    Comment: Performed at [facility], [address] [place], [place] [address]  CBC     Status: Abnormal   Collection Time: 06/01/18  2:21 AM  Result Value Ref Range   WBC 5.1 4.0 - 10.5 K/uL   RBC 3.99 3.87 - 5.11 MIL/uL   Hemoglobin 10.8 (L) 12.0 - 15.0 g/dL   HCT 52.833.7 (L) 41.336.0 - 24.446.0 %   MCV 84.5 80.0 - 100.0 fL   MCH 27.1 26.0 - 34.0 pg   MCHC 32.0 30.0 - 36.0 g/dL   RDW 01.021.3 (H) 27.211.5 - 53.615.5 %   Platelets 99 (L) 150 - 400 K/uL    Comment: REPEATED TO VERIFY PLATELET COUNT CONFIRMED BY SMEAR SPECIMEN CHECKED FOR CLOTS Immature Platelet Fraction may be clinically indicated, consider ordering this additional  test [id number]    nRBC 0.0 0.0 - 0.2 %    Comment: Performed at [facility], [address] [place], [place] [address]  Basic metabolic panel     Status: Abnormal   Collection Time: 06/01/18  2:21 AM  Result Value Ref Range   Sodium 139 135 - 145 mmol/L   Potassium 3.4 (L) 3.5 - 5.1 mmol/L   Chloride 101 98 - 111 mmol/L   CO2 28 22 - 32 mmol/L   Glucose, Bld 102 (H) 70 - 99 mg/dL   BUN 5 (L) 8 - 23 mg/dL   Creatinine, Ser 2.84 0.44 - 1.00 mg/dL   Calcium 9.1 8.9 - 13.2 mg/dL   GFR calc non Af Amer >60 >60 mL/min   GFR calc Af Amer >60 >60 mL/min   Anion gap 10 5 - 15    Comment: Performed at [facility], [address], [place], [place] [address]   Dg Chest Port 1 View  Result Date: 05/30/2018 CLINICAL DATA:  Post TARV. EXAM: PORTABLE CHEST - 1 VIEW COMPARISON:  05/26/2018 FINDINGS: Right IJ central line extends to the cavoatrial junction. No pneumothorax. Changes of TAVR. Lungs clear. Heart size and mediastinal contours are within normal limits. Aortic Atherosclerosis (ICD10-170.0). No effusion. Visualized bones unremarkable. IMPRESSION: No acute cardiopulmonary disease. Electronically Signed   By: Corlis Leak M.D.   On: 05/30/2018 12:11      Assessment/Plan Principal  Problem:   S/P TAVR (transcatheter aortic valve replacement) Active Problems:   Hyperlipidemia   HTN (hypertension)   GERD (gastroesophageal reflux disease)   Hiatal hernia   S/P Nissen fundoplication (without gastrostomy tube) procedure   Chronic bronchitis (HCC)   Iron deficiency anemia due to chronic blood loss   Severe aortic stenosis   Barrett's esophagus  Recurrent Hiatal hernia - hx of nissen in 2015, hernia recurred - cardiology would prefer to keep pt on antiplatelets for 6 months before stopping them for any surgeries unless emergent surgery is needed - Since symptoms are chronic we would not elect to do any surgical repair at this time - would recommend medical management of symptoms until pt follows up with Dr. Daphine Deutscher after discharge - increased protonix to BID - please page [place] with any further questions or concerns.   Thank you for the consult.    Jerre Simon, [facility] 06/01/2018, 10:39 AM Pager: [phone] Consults: [phone] Mon-Fri 7:00 am-4:30 pm Sat-Sun 7:00 am-11:30 am

## 2018-06-02 LAB — BASIC METABOLIC PANEL
Anion gap: 13 (ref 5–15)
BUN: 6 mg/dL — ABNORMAL LOW (ref 8–23)
CO2: 29 mmol/L (ref 22–32)
Calcium: 9.5 mg/dL (ref 8.9–10.3)
Chloride: 95 mmol/L — ABNORMAL LOW (ref 98–111)
Creatinine, Ser: 0.73 mg/dL (ref 0.44–1.00)
GFR calc Af Amer: >60 mL/min (ref >60)
Glucose, Bld: 100 mg/dL — ABNORMAL HIGH (ref 70–99)
Potassium: 3.5 mmol/L (ref 3.5–5.1)
Sodium: 137 mmol/L (ref 135–145)

## 2018-06-02 LAB — CBC
HCT: 35.8 % — ABNORMAL LOW (ref 36.0–46.0)
Hemoglobin: 11.6 g/dL — ABNORMAL LOW (ref 12.0–15.0)
MCH: 27 pg (ref 26.0–34.0)
MCHC: 32.4 g/dL (ref 30.0–36.0)
MCV: 83.4 fL (ref 80.0–100.0)
PLATELETS: 102 10*3/uL — AB (ref 150–400)
RBC: 4.29 MIL/uL (ref 3.87–5.11)
RDW: 20.5 % — ABNORMAL HIGH (ref 11.5–15.5)
WBC: 6.3 10*3/uL (ref 4.0–10.5)
nRBC: 0 % (ref 0.0–0.2)

## 2018-06-02 NOTE — Progress Notes (Signed)
  [facility]   MULTIDISCIPLINARY HEART VALVE TEAM  Patient seen this AM. She was discharged yesterday. She is only here today because of transportation issues 2/2 inclement weather yesterday. She is complaining of knee pain and asking for pain meds. I have asked her to follow up with her PCP. Please see discharge summary from yesterday. Family here to take her home.   Cline Crock PA-C  [facility]

## 2018-06-02 NOTE — Progress Notes (Signed)
CARDIAC REHAB PHASE I   PRE:  Rate/Rhythm: 81 SR    BP: sitting 148/84    SaO2: 97 RA  MODE:  Ambulation: 50 ft   POST:  Rate/Rhythm: 96 SR    BP: sitting 144/85     SaO2: 96 RA  Pt is limited by right calf/knee pain today. Limping, slow walk with RW and assist x2, gait belt. Seems especially difficult for her to bend it sitting on bed. No other c/o. Family st this is ongoing PTA. Has RW at home, suggest HHPT. To bed to have IV out. [phone]   Sherri Mcmillan CES, ACSM 06/02/2018 9:50 AM

## 2018-06-05 MED FILL — Magnesium Sulfate Inj 50%: INTRAMUSCULAR | Qty: 10 | Status: AC

## 2018-06-05 MED FILL — Heparin Sodium (Porcine) Inj 1000 Unit/ML: INTRAMUSCULAR | Qty: 30 | Status: AC

## 2018-06-05 MED FILL — Potassium Chloride Inj 2 mEq/ML: INTRAVENOUS | Qty: 40 | Status: AC

## 2018-06-06 ENCOUNTER — Telehealth: Payer: Self-pay | Admitting: Internal Medicine

## 2018-06-06 NOTE — Telephone Encounter (Signed)
[facility] [phone] verbal order for a social work visit

## 2018-06-06 NOTE — Telephone Encounter (Signed)
[facility], pt seems confused about her medication and insurance coverage, this will be just to make sure she is doing well on her own. Do you agree?

## 2018-06-06 NOTE — Telephone Encounter (Signed)
Rtc, lm for rtc 

## 2018-06-07 NOTE — Telephone Encounter (Signed)
I agree. Thank you. Meds may have changed since she got a TAVR.

## 2018-06-08 ENCOUNTER — Ambulatory Visit (INDEPENDENT_AMBULATORY_CARE_PROVIDER_SITE_OTHER): Payer: Medicare Other | Admitting: Physician Assistant

## 2018-06-08 ENCOUNTER — Telehealth (HOSPITAL_COMMUNITY): Payer: Self-pay

## 2018-06-08 ENCOUNTER — Other Ambulatory Visit: Payer: Self-pay | Admitting: Physician Assistant

## 2018-06-08 ENCOUNTER — Other Ambulatory Visit: Payer: Self-pay | Admitting: *Deleted

## 2018-06-08 ENCOUNTER — Encounter: Payer: Self-pay | Admitting: Physician Assistant

## 2018-06-08 VITALS — BP 130/70 | HR 81 | Ht 60.0 in | Wt 117.1 lb

## 2018-06-08 DIAGNOSIS — K219 Gastro-esophageal reflux disease without esophagitis: Secondary | ICD-10-CM

## 2018-06-08 DIAGNOSIS — E876 Hypokalemia: Secondary | ICD-10-CM

## 2018-06-08 DIAGNOSIS — I10 Essential (primary) hypertension: Secondary | ICD-10-CM | POA: Diagnosis not present

## 2018-06-08 DIAGNOSIS — G4700 Insomnia, unspecified: Secondary | ICD-10-CM

## 2018-06-08 DIAGNOSIS — Z952 Presence of prosthetic heart valve: Secondary | ICD-10-CM

## 2018-06-08 MED ORDER — CHLORTHALIDONE 25 MG PO TABS: 25.0000 mg | ORAL_TABLET | Freq: Every day | ORAL | 3 refills | Status: DC

## 2018-06-08 MED ORDER — AMOXICILLIN 500 MG PO TABS: ORAL_TABLET | ORAL | 6 refills | Status: DC

## 2018-06-08 NOTE — Patient Instructions (Addendum)
Medication Instructions:  Your provider discussed the importance of taking an antibiotic prior to all dental visits to prevent damage to the heart valves from infection. You were given a prescription for AMOXIL 2,000 mg to take one hour prior to any dental appointment.   Labwork: TODAY: BMET  Testing/Procedures: No new orders   Follow-Up: Please keep your upcoming appointments!  Please make an appointment with Dr. Daphine Deutscher.

## 2018-06-08 NOTE — Progress Notes (Addendum)
[facility]   MULTIDISCIPLINARY HEART VALVE CLINIC                                       Cardiology Office Note    Date:  06/08/2018   ID:  Sherri Mcmillan, DOB 1933-02-17, MRN [id number]  PCP:  Sherri Dredge, MD  Cardiologist:  Dr. Cristal Mcmillan / Dr. Clifton Mcmillan & Dr. Laneta Mcmillan (TAVR)  CC: [facility] s/p TAVR  History of Present Illness:  Sherri Mcmillan is a 85 y.o. female with a history of HTN, HLD, anxiety/depression with previous admissions for drug overdose and multiple somatic complaints,hiatal hernia with GERD/reflux esophagitis/Barrett's esophaguss/p failedlaparoscopic Nissen fundoplication, andsevere AS s/p TAVR (05/30/18) who presents to clinic for follow up.   She apparently had a failure of the fundoplication and has had recurrent severe reflux causing esophagitis with burning pain in her chest, regurgitation, wheezing and shortness of breath particularly when lying down. She was being evaluated for further surgical treatment of her hiatal hernia and had a preoperative cardiac evaluation. A repeat echocardiogram on 02/24/2018 showed a left ventricular ejection fraction of 60 to 65% with grade 2 diastolic dysfunction. The aortic valve was thickened and calcified with restricted mobility and a mean gradient of 40 mmHg, aortic valve area of 0.9 cm, and dimensionless index of 0.24 consistent with severe aortic stenosis. She was seen by Dr. Excell Seltzer for consideration of TAVR and underwent cardiac catheterization on 04/06/2018 which showed no coronary artery disease. The mean gradient across aortic valve was 38 with a peak to peak gradient of 43 mmHg.  She underwent successful TAVR with a24mm Medtronic Pro Plusvia the TF approach on 05/30/18. Post operative echoshowed EF 65%, normally functioning TAVR valve with a mean gradient of 10 mm Hg and trace AI. Her hospital course was complicated by chest pain felt to be related to reflux that did not resolved with IVPPI, H2 blocker and  sucralfate.General surgery was consulted who recommended outpatient follow up with Dr. Daphine Mcmillan and increase PPI to BID. Of note, she had some mild hypokalemia and she was started on a potassium supplement.  Today she presents to clinic for follow up. No CP or SOB. No LE edema, orthopnea or PND. No dizziness or syncope. No blood in stool or urine. No palpitations. Complains of knee pain, headache and not being able to sleep.    Past Medical History:  Diagnosis Date  . Anemia 12/14/2012  . Ataxia 06/24/2016  . Auditory hallucinations 08/19/2010   Has has auditory hallucinations, which seem to have worsened since death of her sister January 29, 2010. Admission to [facility] (90s or early 2000)  for 6 weeks after mother died, also with hallucinations.  B/C of severe mental illness and refusal to see pysch, I have refused to refill controlled meds. If she decides to pursue mental health, then she needs to take the initiative and sch the appt bc Sherri Mcmillan has s  . Barrett's esophagus    Demonstrated on EGD 01/30/2011. EGD 09/17/2012 shows inflamed GE junctional mucosa without metaplasia, dysplasia, or malignancy.  . Cataract   . Chronic anxiety    Admission to [facility] (90s or early 2000)  for 6 weeks after mother died. Complicated again by death of her sister 2010. 2012 developed hallucinations and I refused to refill controlled meds unless she see psych which she is not agreable to  . Chronic insomnia   .  Depression   . DVT (deep venous thrombosis) (HCC)   . Gastroparesis    Demonstrated on GES 12/2010 by Dr Dalene SeltzerKapland  . GERD (gastroesophageal reflux disease)    describes " I sometimes have a hard time talking or swallowing" - relative to the reflux  . H/O hiatal hernia   . Hiatal hernia   . HLD (hyperlipidemia) 01/2010  . HTN (hypertension)    Controlled with 2 drug therapy  . Left leg DVT (HCC) 09/28/2012  . Migraine   . S/P TAVR (transcatheter aortic valve replacement) 05/30/2018   s/p 26 mm Medtronic Evolut Pro Plus via  TF approach on 05/30/18  . Severe aortic stenosis     Past Surgical History:  Procedure Laterality Date  . 24 HOUR PH STUDY N/A 08/24/2017   Procedure: 24 HOUR PH STUDY IMPEDANCE;  Surgeon: Sherrilyn Ristanis, Henry L III, MD;  Location: [facility];  Service: Gastroenterology;  Laterality: N/A;  . ABDOMINAL HYSTERECTOMY    . APPENDECTOMY    . CARDIAC CATHETERIZATION    . CATARACT EXTRACTION     left eye  . COLONOSCOPY  2000&2005   Dr.Magod  . DIRECT LARYNGOSCOPY   September 2008    preoperative diagnosis hoarseness with anterior right vocal cord lesion -  direct laryngoscopy and excisional biopsy of right anterior vocal cord lesion done by Dr. Ezzard StandingNewman  . ENDOVENOUS ABLATION SAPHENOUS VEIN W/ LASER Left 05-09-2014   EVLA left small saphenous vein by Gretta Beganodd Early MD  . ESOPHAGEAL MANOMETRY N/A 08/24/2017   Procedure: ESOPHAGEAL MANOMETRY (EM);  Surgeon: Sherrilyn Ristanis, Henry L III, MD;  Location: [facility];  Service: Gastroenterology;  Laterality: N/A;  . ESOPHAGOGASTRODUODENOSCOPY  11/2010  . ESOPHAGOGASTRODUODENOSCOPY N/A 09/17/2012   Procedure: ESOPHAGOGASTRODUODENOSCOPY (EGD);  Surgeon: Hart Carwinora M Brodie, MD;  [facility];  Service: Endoscopy;  Laterality: N/A;  . ESOPHAGOGASTRODUODENOSCOPY N/A 12/17/2012   Procedure: ESOPHAGOGASTRODUODENOSCOPY (EGD);  Surgeon: Beverley FiedlerJay M Pyrtle, MD;  [facility];  Service: Gastroenterology;  Laterality: N/A;  . EXCISION MASS HEAD N/A 03/05/2016   Procedure: EXCISION POSTERIOR SCALP MASS;  Surgeon: Axel FillerArmando Ramirez, MD;  Location: [facility] OR;  Service: General;  Laterality: N/A;  . EYE SURGERY Bilateral    cataracts removed-   . HEMORRHOID SURGERY    . KNEE ARTHROSCOPY    . LAPAROSCOPIC NISSEN FUNDOPLICATION N/A 05/02/2013   Procedure: LAPAROSCOPIC NISSEN FUNDOPLICATION;  Surgeon: Valarie MerinoMatthew B Martin, MD;  Location: [facility] ORS;  Service: General;  Laterality: N/A;  . MENISCECTOMY   July 2002    preoperative diagnosis torn medial meniscus right knee, partial medial  meniscectomy, debridement chondroplasty patellofemoral joint, done by Dr. Madelon Lipsaffrey  . POLYPECTOMY  2000   Dr.Magod  . RIGHT/LEFT HEART CATH AND CORONARY ANGIOGRAPHY N/A 04/06/2018   Procedure: RIGHT/LEFT HEART CATH AND CORONARY ANGIOGRAPHY;  Surgeon: Kathleene HazelMcAlhany, Christopher D, MD;  Location: [facility] INVASIVE CV LAB;  Service: Cardiovascular;  Laterality: N/A;  . ROTATOR CUFF REPAIR  09/21/2011   rt shoulder  . TEE WITHOUT CARDIOVERSION N/A 05/30/2018   Procedure: TRANSESOPHAGEAL ECHOCARDIOGRAM (TEE);  Surgeon: Kathleene HazelMcAlhany, Christopher D, MD;  [facility];  Service: Open Heart Surgery;  Laterality: N/A;  . TOOTH EXTRACTION Bilateral 04/05/2018   Procedure: Extraction of tooth #'s 6 and 29 with alveoloplasty and gross debridement of remaining teeth;  Surgeon: Charlynne PanderKulinski, Ronald F, DDS;  Location: [facility] OR;  Service: Oral Surgery;  Laterality: Bilateral;  . TRANSCATHETER AORTIC VALVE REPLACEMENT, TRANSFEMORAL N/A 05/30/2018   Procedure: TRANSCATHETER AORTIC VALVE REPLACEMENT, TRANSFEMORAL - MEDTRONIC VALVE;  Surgeon: Sherri JamesMcAlhany,  Nile Dear, MD;  Location: [facility];  Service: Open Heart Surgery;  Laterality: N/A;    Current Medications: Outpatient Medications Prior to Visit  Medication Sig Dispense Refill  . amLODipine (NORVASC) 10 MG tablet TAKE 1 TABLET BY MOUTH EVERY DAY (Patient taking differently: Take 10 mg by mouth daily. ) 90 tablet 2  . buPROPion (WELLBUTRIN XL) 150 MG 24 hr tablet TAKE 1 TABLET BY MOUTH EVERY DAY 90 tablet 0  . chlorthalidone (HYGROTON) 25 MG tablet Take 1 tablet (25 mg total) by mouth daily. 90 tablet 3  . clopidogrel (PLAVIX) 75 MG tablet Take 75 mg by mouth daily.    Marland Kitchen dexlansoprazole (DEXILANT) 60 MG capsule Take 1 capsule (60 mg total) by mouth 2 (two) times daily. 180 capsule   . potassium chloride (K-DUR,KLOR-CON) 10 MEQ tablet Take 10 mEq by mouth daily.    Marland Kitchen PROAIR HFA 108 (90 Base) MCG/ACT inhaler INHALE 2 PUFFS INTO THE LUNGS EVERY 4 (FOUR) HOURS AS NEEDED FOR WHEEZING OR  SHORTNESS OF BREATH. (Patient taking differently: Inhale 2 puffs into the lungs every 4 (four) hours as needed for wheezing or shortness of breath. ) 8.5 Inhaler 3  . ranitidine (ZANTAC) 300 MG tablet Take 300 mg by mouth at bedtime.    . sucralfate (CARAFATE) 1 GM/10ML suspension TAKE 10 MLS (1 G TOTAL) BY MOUTH 2 (TWO) TIMES DAILY. (Patient taking differently: Take 1 g by mouth 2 (two) times daily. TAKE 10 MLS (1 G TOTAL) BY MOUTH 2 (TWO) TIMES DAILY.) 420 mL 1  . acetaminophen (TYLENOL) 500 MG tablet Take 1,000 mg by mouth every 6 (six) hours as needed.    . clopidogrel (PLAVIX) 75 MG tablet Take 1 tablet (75 mg total) by mouth daily with breakfast. 90 tablet 1  . [employer] MELATONIN 10 MG CAPS TAKE 10 MG BY MOUTH AT BEDTIME. (Patient taking differently: Take 10 mg by mouth at bedtime as needed (sleep). ) 90 capsule 1  . hydroxypropyl methylcellulose / hypromellose (ISOPTO TEARS / GONIOVISC) 2.5 % ophthalmic solution Place 1 drop into both eyes daily as needed for dry eyes.    . potassium chloride (K-DUR) 10 MEQ tablet Take 1 tablet (10 mEq total) by mouth daily. 30 tablet 6  . ranitidine (ZANTAC) 300 MG tablet Take 1 tablet (300 mg total) by mouth at bedtime. 90 tablet 1   No facility-administered medications prior to visit.      Allergies:   Aspirin   Social History   Socioeconomic History  . Marital status: Widowed    Spouse name: Not on file  . Number of children: 1  . Years of education: some college  . Highest education level: Not on file  Occupational History  . Occupation: Retired-worked in [employer]  Social Needs  . Financial resource strain: Hard  . Food insecurity:    Worry: Often true    Inability: Often true  . Transportation needs:    Medical: Yes    Non-medical: No  Tobacco Use  . Smoking status: Former Smoker    Years: 50.00    Types: Cigarettes    Last attempt to quit: 06/02/1983    Years since quitting: 35.0  . Smokeless tobacco: Never Used  Substance and Sexual  Activity  . Alcohol use: No    Alcohol/week: 0.0 standard drinks    Comment: quit 24 years ago  . Drug use: No  . Sexual activity: Not Currently    Birth control/protection: Post-menopausal, None  Lifestyle  . Physical  activity:    Days per week: Not on file    Minutes per session: Not on file  . Stress: Not on file  Relationships  . Social connections:    Talks on phone: More than three times a week    Gets together: More than three times a week    Attends religious service: More than 4 times per year    Active member of club or organization: Yes    Attends meetings of clubs or organizations: More than 4 times per year    Relationship status: Widowed  Other Topics Concern  . 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 The patient nolonger drinks alcohol.   The patient is widowed.   Patient has an adopted daughter living in [place].   Education: some college.      Family History:  The patient's family history includes Diabetes in her brother; Heart attack in her father and mother; Heart disease in her mother; Hypertension in her father and mother.     ROS:   Please see the history of present illness.    ROS All other systems reviewed and are negative.   PHYSICAL EXAM:   VS:  BP 130/70   Pulse 81   Ht 5' (1.524 m)   Wt 117 lb 1.9 oz (53.1 kg)   LMP 04/26/1950   SpO2 98%   BMI 22.87 kg/m    GEN: Well nourished, well developed, in no acute distress HEENT: normal Neck: no JVD or masses Cardiac: RRR; soft flow murmur. No rubs, or gallops,no edema  Respiratory:  clear to auscultation bilaterally, normal work of breathing GI: soft, nontender, nondistended, +  BS MS: no deformity or atrophy Skin: warm and dry, no rash. Groin sites stable with no hematoma or ecchymosis  Neuro:  Alert and Oriented x 3, Strength and sensation are intact Psych: euthymic mood, full affect   Wt Readings from Last 3 Encounters:  06/08/18 117 lb 1.9 oz (53.1 kg)  06/02/18 117 lb 15.1 oz (53.5 kg)  05/26/18 118 lb 1 oz (53.6 kg)      Studies/Labs Reviewed:   EKG:  EKG is ordered today.  The ekg ordered today demonstrates sinus, HR 81  Recent Labs: 05/26/2018: ALT 15; B Natriuretic Peptide 75.5 05/31/2018: Magnesium 1.8 06/02/2018: BUN 6; Creatinine, Ser 0.73; Hemoglobin 11.6; Platelets 102; Potassium 3.5; Sodium 137   Lipid Panel    Component Value Date/Time   CHOL 220 (H) 06/25/2016 0251   TRIG 44 06/25/2016 0251   HDL 93 06/25/2016 0251   CHOLHDL 2.4 06/25/2016 0251   VLDL 9 06/25/2016 0251   LDLCALC 118 (H) 06/25/2016 0251    Additional studies/ records that were reviewed today include:  TAVR OPERATIVE NOTE   Date of Procedure:05/30/2018  Preoperative Diagnosis:Severe Aortic Stenosis   Postoperative Diagnosis:Same   Procedure:   Transcatheter Aortic Valve Replacement - PercutaneousRightTransfemoral Approach Medtronic Evolut PRO +(size [id number] ReinMarland KitcheningMerBaylor Scott & White [id number]-Joni ReinMarland KitcheningMerHastings Surgical [id number] ReinMarland KitcheningMerPosada Ambulatory [id number] ReinMarland KitcheningMerSurgicare Surgical Associates Of [id number] ReinMarland KitcheningMerLagrange Surgery [id number] ReinMarland KitcheningMerSaginaw Valley [id number] ReinMarland KitcheningMerFranconiaspringfield Surgery [id number] ReinMarland KitcheningMerHelena Regional [id number] ReinMarland KitcheningMerGeorgia Neurosurgical Institute Outpatient [id number] ReinMarland KitcheningMerQuitman [id number] ReinMarland KitcheningMerTops Surgical [id number] ReinMarland KitcheningMerMorgan Hill [id number] ReinMarland KitcheningMerEssentia Health [id number] ReinMarland KitcheningMerJohn Peter Smit27409Joni [id number] ReinMarland KitcheningMerAdvanced Center For [id number] ReinMarland KitcheningMerPankratz Eye [id number] ReinMarland KitcheningMerGrandview [id number] ReinMarland KitcheningMerThe Hand And Upper Extremity Surgery Center Of [id number] ReinMarland KitcheningMerBaylor Emergency [id number] ReinMarland KitcheningMerAlameda Hospital-South Shore [id number] ReinMarland KitcheningMerGuam [id number] ReinMarland KitcheningMerCarmel Specialty [id number] ReinMarland KitcheningMerNaples [id number] ReinMarland KitcheningMerDickinson County Memorial Hospitaling [id number])  Co-Surgeons:Bryan K. Bartle, MD and Christopher McAlhany, MD  c Anesthesiologist:R. Ellender, MD  Echocardiographer:K. Nelson, MD  Pre-operative Echo Findings: ? Severe aortic stenosis ? Normalleft ventricular systolic function  Post-operative Echo Findings: ? Trivialparavalvular leak ? Normalleft ventricular systolic function  _______________  Echo 05/31/18 IMPRESSIONS 1. The left ventricle has hyperdynamic systolic function of >65%. The cavity size is normal. There is no increased left ventricular wall thickness. Echo evidence of impaired diastolic  relaxation. 2. The right ventricle has normal systolic function. The cavity in normal in size. There is no increase in right ventricular wall thickness. 3. Mildly dilated left atrial size. 4. A 26 a Medtronic CoreValve-Evolut Pro bioprosthetic aortic valve valve is present in the aortic position. Normal aortic valve prosthesis. 5. Normal LV systolic function; mild diastolic dysfunction; s/p TAVR with trace AI and mean gradient 10 mmHg; mild LAE; mild TR with mild pulmonary hypertension.    ASSESSMENT & PLAN:   Severe AS s/p TAVR: doing well. Groin sites without hematoma or ecchymosis. ECG with sinus and no HAVB. SBE prophylaxis discussed; I have RX'd amoxicillin. Continue plavix only given aspirin allergy. I will see her back 3/5 for 1 month follow up with echo.   HTN: BP well controlled today   GERD/reflux esophagitis/Barrett's esophagus:I have asked her to make an appointment with Dr. Daphine Mcmillan to discuss timing of her hernia repair  Hypokalemia: started on K supplementation at discharge. Check BMET today  Insomnia: I have recommended she try OTC medications such as melatonin or unisom   Medication Adjustments/Labs and Tests Ordered: Current medicines are reviewed at length with the patient today.  Concerns regarding medicines are outlined above.  Medication changes, Labs and Tests ordered today are listed in the Patient Instructions below. Patient Instructions  Medication Instructions:  Your provider discussed the importance of taking an antibiotic prior to all dental visits to prevent damage to the heart valves from infection. You were given a prescription for AMOXIL 2,000 mg to take one hour prior to any dental appointment.   Labwork: TODAY: BMET  Testing/Procedures: No new orders   Follow-Up: Please keep your upcoming appointments!  Please make an appointment with Dr. Daphine Mcmillan.    Signed, Cline Crock, PA-C  06/08/2018 3:01 PM    [facility]  [facility] [address] [place], [place], [place]  [address] Phone: [phone]; Fax: [phone]

## 2018-06-08 NOTE — Telephone Encounter (Signed)
Pt insurance is active and benefits verified through [facility]. Co-pay $0.00, DED $0.00/$0.00 met, out of pocket $6,700.00/$0.00 met, co-insurance 0%. No pre-authorization required. Passport, 06/08/2018 @ 2:44PM, REF# [id number]  Will contact patient to see if she is interested in the Cardiac Rehab Program. If interested, patient will need to complete follow up appt. Once completed, patient will be contacted for scheduling upon review by the RN Navigator.

## 2018-06-09 ENCOUNTER — Telehealth: Payer: Self-pay

## 2018-06-09 LAB — BASIC METABOLIC PANEL
BUN/Creatinine Ratio: 22 (ref 12–28)
BUN: 17 mg/dL (ref 8–27)
CHLORIDE: 102 mmol/L (ref 96–106)
CO2: 24 mmol/L (ref 20–29)
Calcium: 9.7 mg/dL (ref 8.7–10.3)
Creatinine, Ser: 0.76 mg/dL (ref 0.57–1.00)
GFR calc Af Amer: 84 mL/min/{1.73_m2} (ref >59)
GFR calc non Af Amer: 73 mL/min/{1.73_m2} (ref >59)
Glucose: 77 mg/dL (ref 65–99)
POTASSIUM: 4.9 mmol/L (ref 3.5–5.2)
Sodium: 141 mmol/L (ref 134–144)

## 2018-06-09 NOTE — Telephone Encounter (Signed)
Thank you :)

## 2018-06-09 NOTE — Telephone Encounter (Signed)
Sherri Mcmillan (PT) with [facility] states she went to the pt home today, the patient did not answered the door or the phone. Will try back next week for PT visit.

## 2018-06-12 ENCOUNTER — Telehealth: Payer: Self-pay | Admitting: *Deleted

## 2018-06-12 NOTE — Telephone Encounter (Signed)
Pt has missed 3 PT appts and dismissed HH nursing. PT will give her 1 more try then case will be closed

## 2018-06-14 ENCOUNTER — Telehealth: Payer: Self-pay

## 2018-06-14 NOTE — Telephone Encounter (Signed)
Called the patient. She is feeling well. Occasional SOB that occurs with rest. Is not consistent. Ambulates daily. Discussed PT. She does not want PT and feels she can do it on her own. Discussed that PT can be very valuable in getting her energy levels and activity levels back up. She again declined. She will call back if she changes her mind.

## 2018-06-14 NOTE — Telephone Encounter (Signed)
Bethany with [facility] want to informed the doctor, pt is discharged from [facility] because pt had cancelled PT visit with them.

## 2018-06-14 NOTE — Telephone Encounter (Signed)
FYI

## 2018-06-29 ENCOUNTER — Encounter: Payer: Self-pay | Admitting: Physician Assistant

## 2018-06-29 ENCOUNTER — Ambulatory Visit (HOSPITAL_COMMUNITY): Payer: Medicare Other | Attending: Cardiovascular Disease

## 2018-06-29 ENCOUNTER — Ambulatory Visit (INDEPENDENT_AMBULATORY_CARE_PROVIDER_SITE_OTHER): Payer: Medicare Other | Admitting: Physician Assistant

## 2018-06-29 VITALS — BP 128/72 | HR 67 | Ht 60.0 in | Wt 120.8 lb

## 2018-06-29 DIAGNOSIS — Z952 Presence of prosthetic heart valve: Secondary | ICD-10-CM

## 2018-06-29 DIAGNOSIS — K219 Gastro-esophageal reflux disease without esophagitis: Secondary | ICD-10-CM

## 2018-06-29 DIAGNOSIS — G4700 Insomnia, unspecified: Secondary | ICD-10-CM

## 2018-06-29 DIAGNOSIS — I10 Essential (primary) hypertension: Secondary | ICD-10-CM | POA: Diagnosis not present

## 2018-06-29 LAB — ECHOCARDIOGRAM COMPLETE: Height: 60 in

## 2018-06-29 NOTE — Patient Instructions (Addendum)
Medication Instructions:  Your provider recommends that you continue on your current medications as directed. Please refer to the Current Medication list given to you today.     Follow-Up: You have an appointment to establish with Dr. Cristal Deer on October 17, 2018 at 9:20AM.   Your one year TAVR visits are scheduled May 31, 2019. Please arrive by 12:30PM for your echocardiogram and visit with Carlean Jews.

## 2018-06-29 NOTE — Progress Notes (Signed)
[facility]   MULTIDISCIPLINARY HEART VALVE CLINIC                                       Cardiology Office Note    Date:  06/29/2018   ID:  Sherri Mcmillan, DOB 11-24-1932, MRN [id number]  PCP:  Levora Dredge, MD  Cardiologist:  Dr. Cristal Deer / Dr. Clifton James & Dr. Laneta Simmers (TAVR)  CC: 1 month s/p TAVR  History of Present Illness:  Sherri Mcmillan is a 85 y.o. female with a history of HTN, HLD, anxiety/depression with previous admissions for drug overdose and multiple somatic complaints,hiatal hernia with GERD/reflux esophagitis/Barrett's esophaguss/p failedlaparoscopic Nissen fundoplication, andsevere AS s/p TAVR (05/30/18) who presents to clinic for follow up.   She apparently had a failure of the fundoplication and has had recurrent severe reflux causing esophagitis with burning pain in her chest, regurgitation, wheezing and shortness of breath particularly when lying down. She was being evaluated for further surgical treatment of her hiatal hernia and had a preoperative cardiac evaluation. A repeat echocardiogram on 02/24/2018 showed a left ventricular ejection fraction of 60 to 65% with grade 2 diastolic dysfunction. The aortic valve was thickened and calcified with restricted mobility and a mean gradient of 40 mmHg, aortic valve area of 0.9 cm, and dimensionless index of 0.24 consistent with severe aortic stenosis. She was seen by Dr. Excell Seltzer for consideration of TAVR and underwent cardiac catheterization on 04/06/2018 which showed no coronary artery disease. The mean gradient across aortic valve was 38 with a peak to peak gradient of 43 mmHg.  She underwent successful TAVR with a42mm Medtronic Pro Plusvia the TF approach on 05/30/18. Post operative echoshowed EF 65%, normally functioning TAVR valve with a mean gradient of 10 mm Hg and trace AI. Her hospital course was complicated by chest pain felt to be related to reflux that did not resolved with IVPPI, H2 blocker and  sucralfate.General surgery was consulted who recommended outpatient follow up with Dr. Daphine Deutscher and increase PPI to BID. Of note, she had some mild hypokalemia and she was started on a potassium supplement.  Today she presents to clinic for follow up. She has been walking around and doing well with this. Sometimes gets shortness of breath when she has acid reflux. She overall feels good but is very symptomatic from her GERD with chest pain and SOB. No LE edema, she has chronic 3 pillow orthopnea which is unchanged. No PND. No dizziness or syncope. No blood in her stool or urine.    Past Medical History:  Diagnosis Date  . Anemia 12/14/2012  . Ataxia 06/24/2016  . Auditory hallucinations 08/19/2010   Has has auditory hallucinations, which seem to have worsened since death of her sister 01/02/2010. Admission to [facility] (90s or early 2000)  for 6 weeks after mother died, also with hallucinations.  B/C of severe mental illness and refusal to see pysch, I have refused to refill controlled meds. If she decides to pursue mental health, then she needs to take the initiative and sch the appt bc [name] has s  . Barrett's esophagus    Demonstrated on EGD 01-03-2011. EGD 09/17/2012 shows inflamed GE junctional mucosa without metaplasia, dysplasia, or malignancy.  . Cataract   . Chronic anxiety    Admission to [facility] (90s or early 2000)  for 6 weeks after mother died. Complicated again by death of her  sister 2010. 2012 developed hallucinations and I refused to refill controlled meds unless she see psych which she is not agreable to  . Chronic insomnia   . Depression   . DVT (deep venous thrombosis) (HCC)   . Gastroparesis    Demonstrated on GES 12/2010 by Dr Dalene Seltzer  . GERD (gastroesophageal reflux disease)    describes " I sometimes have a hard time talking or swallowing" - relative to the reflux  . H/O hiatal hernia   . Hiatal hernia   . HLD (hyperlipidemia) 01/2010  . HTN (hypertension)    Controlled with 2 drug  therapy  . Left leg DVT (HCC) 09/28/2012  . Migraine   . 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 Severe aortic stenosis     Past Surgical History:  Procedure Laterality Date  . 24 HOUR PH STUDY N/A 08/24/2017   Procedure: 24 HOUR PH STUDY IMPEDANCE;  Surgeon: Sherrilyn Rist, MD;  Location: [facility] ENDOSCOPY;  Service: Gastroenterology;  Laterality: N/A;  . ABDOMINAL HYSTERECTOMY    . APPENDECTOMY    . CARDIAC CATHETERIZATION    . CATARACT EXTRACTION     left eye  . COLONOSCOPY  2000&2005   Dr.Magod  . DIRECT LARYNGOSCOPY   September 2008    preoperative diagnosis hoarseness with anterior right vocal cord lesion -  direct laryngoscopy and excisional biopsy of right anterior vocal cord lesion done by Dr. Ezzard Standing  . ENDOVENOUS ABLATION SAPHENOUS VEIN W/ LASER Left 05-09-2014   EVLA left small saphenous vein by Gretta Began MD  . ESOPHAGEAL MANOMETRY N/A 08/24/2017   Procedure: ESOPHAGEAL MANOMETRY (EM);  Surgeon: Sherrilyn Rist, MD;  Location: [facility] ENDOSCOPY;  Service: Gastroenterology;  Laterality: N/A;  . ESOPHAGOGASTRODUODENOSCOPY  11/2010  . ESOPHAGOGASTRODUODENOSCOPY N/A 09/17/2012   Procedure: ESOPHAGOGASTRODUODENOSCOPY (EGD);  Surgeon: Hart Carwin, MD;  Location: [facility] ENDOSCOPY;  Service: Endoscopy;  Laterality: N/A;  . ESOPHAGOGASTRODUODENOSCOPY N/A 12/17/2012   Procedure: ESOPHAGOGASTRODUODENOSCOPY (EGD);  Surgeon: Beverley Fiedler, MD;  Location: [facility] ENDOSCOPY;  Service: Gastroenterology;  Laterality: N/A;  . EXCISION MASS HEAD N/A 03/05/2016   Procedure: EXCISION POSTERIOR SCALP MASS;  Surgeon: Axel Filler, MD;  Location: [facility] OR;  Service: General;  Laterality: N/A;  . EYE SURGERY Bilateral    cataracts removed-   . HEMORRHOID SURGERY    . KNEE ARTHROSCOPY    . LAPAROSCOPIC NISSEN FUNDOPLICATION N/A 05/02/2013   Procedure: LAPAROSCOPIC NISSEN FUNDOPLICATION;  Surgeon: Valarie Merino, MD;  Location:  [facility] ORS;  Service: General;  Laterality: N/A;  . MENISCECTOMY   July 2002    preoperative diagnosis torn medial meniscus right knee, partial medial meniscectomy, debridement chondroplasty patellofemoral joint, done by Dr. Madelon Lips  . POLYPECTOMY  2000   Dr.Magod  . RIGHT/LEFT HEART CATH AND CORONARY ANGIOGRAPHY N/A 04/06/2018   Procedure: RIGHT/LEFT HEART CATH AND CORONARY ANGIOGRAPHY;  Surgeon: Kathleene Hazel, MD;  Location: [facility] INVASIVE CV LAB;  Service: Cardiovascular;  Laterality: N/A;  . ROTATOR CUFF REPAIR  09/21/2011   rt shoulder  . TEE WITHOUT CARDIOVERSION N/A 05/30/2018   Procedure: TRANSESOPHAGEAL ECHOCARDIOGRAM (TEE);  Surgeon: Kathleene Hazel, MD;  Location: [facility] OR;  Service: Open Heart Surgery;  Laterality: N/A;  . TOOTH EXTRACTION Bilateral 04/05/2018   Procedure: Extraction of tooth #'s 6 and 29 with alveoloplasty and gross debridement of remaining teeth;  Surgeon: Charlynne Pander, DDS;  Location: [facility] OR;  Service: Oral Surgery;  Laterality: Bilateral;  . TRANSCATHETER AORTIC VALVE REPLACEMENT, TRANSFEMORAL N/A 05/30/2018   Procedure: TRANSCATHETER AORTIC VALVE REPLACEMENT, TRANSFEMORAL - MEDTRONIC VALVE;  Surgeon: Kathleene Hazel, MD;  Location: [facility] OR;  Service: Open Heart Surgery;  Laterality: N/A;    Current Medications: Outpatient Medications Prior to Visit  Medication Sig Dispense Refill  . amLODipine (NORVASC) 10 MG tablet TAKE 1 TABLET BY MOUTH EVERY DAY 90 tablet 2  . amoxicillin (AMOXIL) 500 MG tablet Take 4 tablets (2,000 mg) one hour prior to all dental visits. 8 tablet 6  . buPROPion (WELLBUTRIN XL) 150 MG 24 hr tablet TAKE 1 TABLET BY MOUTH EVERY DAY 90 tablet 0  . chlorthalidone (HYGROTON) 25 MG tablet Take 1 tablet (25 mg total) by mouth daily. 90 tablet 3  . clopidogrel (PLAVIX) 75 MG tablet Take 75 mg by mouth daily.    Marland Kitchen dexlansoprazole (DEXILANT) 60 MG capsule Take 1 capsule (60 mg total) by mouth 2 (two) times daily. 180 capsule   .  diclofenac sodium (VOLTAREN) 1 % GEL     . nortriptyline (PAMELOR) 25 MG capsule Take 25 mg by mouth at bedtime.    . potassium chloride (K-DUR,KLOR-CON) 10 MEQ tablet Take 10 mEq by mouth daily.    Marland Kitchen PROAIR HFA 108 (90 Base) MCG/ACT inhaler INHALE 2 PUFFS INTO THE LUNGS EVERY 4 (FOUR) HOURS AS NEEDED FOR WHEEZING OR SHORTNESS OF BREATH. 8.5 Inhaler 3  . ranitidine (ZANTAC) 300 MG tablet Take 300 mg by mouth at bedtime.    . sucralfate (CARAFATE) 1 GM/10ML suspension TAKE 10 MLS (1 G TOTAL) BY MOUTH 2 (TWO) TIMES DAILY. 420 mL 1   No facility-administered medications prior to visit.      Allergies:   Aspirin   Social History   Socioeconomic History  . Marital status: Widowed    Spouse name: Not on file  . Number of children: 1  . Years of education: some college  . Highest education level: Not on file  Occupational History  . Occupation: Retired-worked in [employer]  Social Needs  . Financial resource strain: Hard  . Food insecurity:    Worry: Often true    Inability: Often true  . Transportation needs:    Medical: Yes    Non-medical: No  Tobacco Use  . Smoking status: Former Smoker    Years: 50.00    Types: Cigarettes    Last attempt to quit: 06/02/1983    Years since quitting: 35.0  . Smokeless tobacco: Never Used  Substance and Sexual Activity  . Alcohol use: No    Alcohol/week: 0.0 standard drinks    Comment: quit 24 years ago  . Drug use: No  . Sexual activity: Not Currently    Birth control/protection: Post-menopausal, None  Lifestyle  . Physical activity:    Days per week: Not on file    Minutes per session: Not on file  . Stress: Not on file  Relationships  . Social connections:    Talks on phone: More than three times a week    Gets together: More than three times a week    Attends religious service: More than 4 times per year    Active member of club or organization: Yes    Attends meetings of clubs or organizations: More than 4 times per year     Relationship status: Widowed  Other Topics Concern  . 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 The patient nolonger drinks alcohol.   The patient is widowed.   Patient has an adopted daughter living in [place].   Education: some college.      Family History:  The patient's family history includes Diabetes in her brother; Heart attack in her father and mother; Heart disease in her mother; Hypertension in her father and mother.     ROS:   Please see the history of present illness.    ROS All other systems reviewed and are negative.   PHYSICAL EXAM:   VS:  BP 128/72   Pulse 67   Ht 5' (1.524 m)   Wt 120 lb 12.8 oz (54.8 kg)   LMP 04/26/1950   SpO2 94%   BMI 23.59 kg/m    GEN: Well nourished, well developed, in no acute distress HEENT: normal Neck: no JVD or masses Cardiac: RRR; soft flow murmur. No rubs, or gallops,no edema  Respiratory:  clear to auscultation bilaterally, normal work of breathing GI: soft, nontender, nondistended, + BS MS: no deformity or atrophy Skin: warm and dry, no rash. Groin sites stable with no hematoma or ecchymosis  Neuro:  Alert and Oriented x 3, Strength and sensation are intact Psych: euthymic mood, full affect   Wt Readings from Last 3 Encounters:  06/29/18 120 lb 12.8 oz (54.8 kg)  06/08/18 117 lb 1.9 oz (53.1 kg)  06/02/18 117 lb 15.1 oz (53.5 kg)      Studies/Labs Reviewed:   EKG:  EKG is NOT ordered today.    Recent Labs: 05/26/2018: ALT 15; B Natriuretic Peptide 75.5 05/31/2018: Magnesium 1.8 06/02/2018: Hemoglobin 11.6; Platelets 102 06/08/2018: BUN 17; Creatinine, Ser 0.76; Potassium 4.9; Sodium 141   Lipid Panel     Component Value Date/Time   CHOL 220 (H) 06/25/2016 0251   TRIG 44 06/25/2016 0251   HDL 93 06/25/2016 0251   CHOLHDL 2.4 06/25/2016 0251   VLDL 9 06/25/2016 0251   LDLCALC 118 (H) 06/25/2016 0251    Additional studies/ records that were reviewed today include:  TAVR OPERATIVE NOTE   Date of Procedure:05/30/2018  Preoperative Diagnosis:Severe Aortic Stenosis   Postoperative Diagnosis:Same   Procedure:   Transcatheter Aortic Valve Replacement - PercutaneousRightTransfemoral Approach Medtronic Evolut PRO +(size 13mm, serial # [id number])  Co-Surgeons:Bryan Jennefer Bravo, MD and Verne Carrow, MD  c Anesthesiologist:R. Bradley Ferris, MD  Lynn Ito, MD  Pre-operative Echo Findings: ? Severe aortic stenosis ? Normalleft ventricular systolic function  Post-operative Echo Findings: ? Trivialparavalvular leak ? Normalleft ventricular systolic function  _______________  Echo 05/31/18 IMPRESSIONS 1. The left ventricle has hyperdynamic systolic function of >65%. The cavity size is normal. There is no increased left ventricular wall thickness. Echo evidence of impaired diastolic relaxation. 2. The right ventricle has normal systolic function. The cavity in normal in size. There is no increase in right ventricular wall thickness. 3. Mildly dilated left atrial size. 4. A 26 a Medtronic CoreValve-Evolut Pro bioprosthetic aortic valve valve is present in the aortic position. Normal aortic valve prosthesis. 5. Normal LV systolic function; mild diastolic dysfunction; s/p TAVR with trace AI and mean gradient 10 mmHg; mild LAE; mild TR with mild pulmonary hypertension.  _______________  Echo 06/29/18 IMPRESSIONS  1. The left ventricle has normal systolic function with an ejection fraction of 60-65%. The cavity size was normal. Left ventricular  diastolic Doppler parameters are consistent with pseudonormalization No evidence of left ventricular regional wall  motion abnormalities.  2. The right ventricle has normal systolic function. The cavity was normal. There is no increase in right ventricular wall thickness.  3. The mitral valve is normal in structure. Mild thickening of the mitral valve leaflet.  4. The tricuspid valve is normal in structure.  5. Post TAVR 26 Medtronic Core Valve no PVL stable gradients.  6. The pulmonic valve was normal in structure.  7. No evidence of left ventricular regional wall motion abnormalities.  ASSESSMENT & PLAN:   Severe AS s/p TAVR: echo today shows EF 60%, normally functioning TAVR with mean gradient of 6 mm Hg and no PVL. She has NYHA class II symptoms. SBE prophylaxis discussed; she has amoxicillin. She is on monotherapy with Plavix given an aspirin allergy. She will continue on this indefinitely. I will see her back in 1 year with echo and follow up. She will establish care with Dr. Cristal Deer between now and then.   HTN: BP well controlled.   GERD/reflux esophagitis/Barrett's esophagus: She has an appointment with Dr. Daphine Deutscher to discuss timing of her hernia repair in April. She is very symptomatic and if we had to stop plavix temporarily to have surgery, I think that is reasonable but wouldn't do until May. Discussed with Dr. Clifton James and he agrees.   Insomnia: no complaints of this today.   Medication Adjustments/Labs and Tests Ordered: Current medicines are reviewed at length with the patient today.  Concerns regarding medicines are outlined above.  Medication changes, Labs and Tests ordered today are listed in the Patient Instructions below. Patient Instructions  Medication Instructions:  Your provider recommends that you continue on your current medications as directed. Please refer to the Current Medication list given to you today.     Follow-Up: You have an appointment to  establish with Dr. Cristal Deer on October 17, 2018 at 9:20AM.   Your one year TAVR visits are scheduled May 31, 2019. Please arrive by 12:30PM for your echocardiogram and visit with Carlean Jews.    Signed, Cline Crock, PA-C  06/29/2018 4:16 PM    [facility] [address] [place], [place], [place]  [address] Phone: [phone]; Fax: [phone]

## 2018-07-05 ENCOUNTER — Encounter (HOSPITAL_COMMUNITY): Payer: Self-pay

## 2018-07-05 NOTE — Telephone Encounter (Signed)
Attempted to call pt but pt doesn't have vm set up. Mailed letter out [employer]. Support Rep II

## 2018-07-13 ENCOUNTER — Telehealth (HOSPITAL_COMMUNITY): Payer: Self-pay

## 2018-07-13 NOTE — Telephone Encounter (Signed)
Called and spoke with pt in regards to CR, adv  we have recv'd the pt referral. And at this time we are not scheduling due to the COVID-19. Once we have resume scheduling we will contact the pt. She verbalized understanding. °

## 2018-08-10 ENCOUNTER — Other Ambulatory Visit: Payer: Self-pay

## 2018-08-10 ENCOUNTER — Ambulatory Visit (INDEPENDENT_AMBULATORY_CARE_PROVIDER_SITE_OTHER): Payer: Medicare Other | Admitting: Internal Medicine

## 2018-08-10 DIAGNOSIS — F329 Major depressive disorder, single episode, unspecified: Secondary | ICD-10-CM | POA: Diagnosis not present

## 2018-08-10 DIAGNOSIS — I350 Nonrheumatic aortic (valve) stenosis: Secondary | ICD-10-CM | POA: Diagnosis not present

## 2018-08-10 DIAGNOSIS — K2100 Gastro-esophageal reflux disease with esophagitis, without bleeding: Secondary | ICD-10-CM

## 2018-08-10 DIAGNOSIS — F419 Anxiety disorder, unspecified: Secondary | ICD-10-CM | POA: Diagnosis not present

## 2018-08-10 NOTE — Progress Notes (Signed)
   CC: AS s/p TAVR and GERD  This is a telephone encounter between Sherri Mcmillan and Sherri Mcmillan on 08/10/2018 for AS s/p TAVR and GERD. The visit was conducted with the patient located at home and [facility] at [facility]. The patient's identity was confirmed using their DOB and current address. The patient has consented to being evaluated through a telephone encounter and understands the associated risks (an examination cannot be done and the patient may need to come in for an appointment) / benefits (allows the patient to remain at home, decreasing exposure to coronavirus). I personally spent 9 minutes on medical discussion.   HPI:  [name] is a 85 y.o. with PMH as below.   Please see A&P for assessment of the patient's acute and chronic medical conditions.   Past Medical History:  Diagnosis Date  . Anemia 12/14/2012  . Ataxia 06/24/2016  . Auditory hallucinations 08/19/2010   Has has auditory hallucinations, which seem to have worsened since death of her sister 02-06-2010. Admission to [facility] (90s or early 2000)  for 6 weeks after mother died, also with hallucinations.  B/C of severe mental illness and refusal to see pysch, I have refused to refill controlled meds. If she decides to pursue mental health, then she needs to take the initiative and sch the appt bc [name] has s  . Barrett's esophagus    Demonstrated on EGD 2011/02/07. EGD 09/17/2012 shows inflamed GE junctional mucosa without metaplasia, dysplasia, or malignancy.  . Cataract   . Chronic anxiety    Admission to [facility] (90s or early 2000)  for 6 weeks after mother died. Complicated again by death of her sister 2010. 2012 developed hallucinations and I refused to refill controlled meds unless she see psych which she is not agreable to  . Chronic insomnia   . Depression   . DVT (deep venous thrombosis) (HCC)   . Gastroparesis    Demonstrated on GES 02/07/2011 by Dr Dalene Seltzer  . GERD (gastroesophageal reflux disease)    describes " I  sometimes have a hard time talking or swallowing" - relative to the reflux  . H/O hiatal hernia   . Hiatal hernia   . HLD (hyperlipidemia) 01/2010  . HTN (hypertension)    Controlled with 2 drug therapy  . Left leg DVT (HCC) 09/28/2012  . Migraine   . 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 Severe aortic stenosis    Review of Systems:  Performed and all others negative.  Assessment & Plan:   See Encounters Tab for problem based charting.  Patient discussed with Dr. Cyndie Chime

## 2018-08-10 NOTE — Assessment & Plan Note (Signed)
HPI: Patient is now s/p TAVR. Her shortness of breath has improved. She does sleep propped up on pillows but this is more related to her acid reflux. She started exercising on her own. She states that she walks greater than five blocks per day. She has been compliant with all her medications and continues to follow with cardiology.  A/P: - Stable

## 2018-08-10 NOTE — Progress Notes (Signed)
Medicine attending: Medical history, presenting problems, and medications, reviewed with resident physician Dr Levora Dredge on the day of the patient telephone consultation and I concur with his evaluation and management plan. Doing well now 2 mos post TAVR; can now address plan for NISSEN fundoplication of HH to prevent recurrent UGI bleed.

## 2018-08-10 NOTE — Assessment & Plan Note (Signed)
HPI: Patient's anxiety/depression has significantly improved with nortriptyline and bupropion.  A/P: - Stable. Continue nortriptyline and bupropion

## 2018-08-10 NOTE — Assessment & Plan Note (Signed)
HPI: Medically refractory acid reflux secondary to a large hiatal hernia. She is currently on Dexlansoprazole 60 mg BID. Her symptoms have improved. She continues to follow with Dr. Daphine Deutscher but is not sure when her neck surgery will be. She has not had any more episodes of nausea vomiting.  A/P: - Sounds like it is improving. - Continue Dexlansoprazole 60 mg BID

## 2018-08-23 ENCOUNTER — Other Ambulatory Visit: Payer: Self-pay | Admitting: Internal Medicine

## 2018-08-23 DIAGNOSIS — F329 Major depressive disorder, single episode, unspecified: Secondary | ICD-10-CM

## 2018-08-23 DIAGNOSIS — F419 Anxiety disorder, unspecified: Principal | ICD-10-CM

## 2018-08-29 ENCOUNTER — Telehealth: Payer: Self-pay

## 2018-08-29 NOTE — Telephone Encounter (Signed)
Returned pt's call - no one answered and vm has not been set up, unable to leave message.

## 2018-08-29 NOTE — Telephone Encounter (Signed)
Please call pt back about not feeling well.

## 2018-08-29 NOTE — Telephone Encounter (Signed)
Return call from pt - c/o cough x 3 weeks; tried OTC meds.   NURSING TRIAGE NOTE FOR RESPIRATORY SYMPTOMS  Do you have a fever? No 98.2 but "usually it's 97.0" Do you have a cough? Yes - productive.Sometimes white. Do you have shortness of breath more than normal? Yes - mild OR with significant movement. "It comes and goes". Do you have chest pain?No Are you able to eat and drink normally? Yes - stated drinking a lot of water and juice. Have you seen a physician for these symptoms? No  Action - explained tele health visit; she's agreeable. [facility] tele health appt tomorrow 5/6 @ 1045 AM.  I informed patient to expect a phone call from a physician soon and I sent request to front desk to schedule a virtual office appt for patient and arrive the patient.

## 2018-08-30 ENCOUNTER — Other Ambulatory Visit: Payer: Self-pay

## 2018-08-30 ENCOUNTER — Ambulatory Visit (INDEPENDENT_AMBULATORY_CARE_PROVIDER_SITE_OTHER): Payer: Medicare Other | Admitting: Internal Medicine

## 2018-08-30 DIAGNOSIS — J45909 Unspecified asthma, uncomplicated: Secondary | ICD-10-CM | POA: Diagnosis not present

## 2018-08-30 DIAGNOSIS — R05 Cough: Secondary | ICD-10-CM

## 2018-08-30 DIAGNOSIS — Z79899 Other long term (current) drug therapy: Secondary | ICD-10-CM | POA: Diagnosis not present

## 2018-08-30 DIAGNOSIS — K2100 Gastro-esophageal reflux disease with esophagitis, without bleeding: Secondary | ICD-10-CM

## 2018-08-30 MED ORDER — FAMOTIDINE 20 MG PO TABS: 20.0000 mg | ORAL_TABLET | Freq: Two times a day (BID) | ORAL | 1 refills | Status: DC

## 2018-08-30 NOTE — Assessment & Plan Note (Addendum)
Patient reports that for the past 3 weeks she has been having a dry cough that is worse at night, she reports some shortness of breath however states that this is stable and uses an inhaler which helps, she is mostly concerned about the dry cough.  She has been using Mucinex, NyQuil p.m., and other over-the-counter cough medications which she reports does not help.  She denies any chest pain, weight loss, weight gain, nausea, vomiting, headaches, chills, fevers.  She has had no recent travel, is mostly staying at home and denies any recent sick contacts.  Reports that she has been taking the dexlansoprazole 60 mg twice daily.  Patient has had history of Barrett's esophagus status post nissen fundoplication.  Given her persistent cough this may be related to uncontrolled GERD, however patient does have a history of asthma but this appears less likely due to stable shortness of breath and timing of the dry cough.  We will add famotidine to see if this will help and have her follow-up GI.   Plan: -Add famotadine 20 mg daily -Continue dexlansoprazole 60 mg twice daily -F/u with GI, Dr. Daphine Deutscher

## 2018-08-30 NOTE — Progress Notes (Signed)
  [facility] Telephone Encounter Continuity Care Appointment  HPI:   This telephone encounter was created for [name] on 08/30/2018 for the following purpose/cc for cough.   Past Sherri History:  Past Sherri History:  Diagnosis Date  . Anemia 12/14/2012  . Ataxia 06/24/2016  . Auditory hallucinations 08/19/2010   Has has auditory hallucinations, which seem to have worsened since death of her sister 02-04-10. Admission to [facility] ([facility] early 2000)  for 6 weeks after mother died, also with hallucinations.  B/C of severe mental illness and refusal to see pysch, I have refused to refill controlled meds. If she decides to pursue mental health, then she needs to take the initiative and sch the appt bc Sherri Mcmillan has s  . Barrett's esophagus    Demonstrated on EGD Feb 05, 2011. EGD 09/17/2012 shows inflamed GE junctional mucosa without metaplasia, dysplasia, or malignancy.  . Cataract   . Chronic anxiety    Admission to [facility] ([facility] or early 2000)  for 6 weeks after mother died. Complicated again by death of her sister 2010. 2012 developed hallucinations and I refused to refill controlled meds unless she see psych which she is not agreable to  . Chronic insomnia   . Depression   . DVT (deep venous thrombosis) (HCC)   . Gastroparesis    Demonstrated on GES 2011-02-05 by Sherri Mcmillan  . GERD (gastroesophageal reflux disease)    describes " I sometimes have a hard time talking or swallowing" - relative to the reflux  . H/O hiatal hernia   . Hiatal hernia   . HLD (hyperlipidemia) 01/2010  . HTN (hypertension)    Controlled with 2 drug therapy  . Left leg DVT (HCC) 09/28/2012  . Migraine   . 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 Severe aortic stenosis       ROS:   Denies fevers, chills, nausea, chest pain, worsening shortness of breath, or weight gain.   Assessment / Plan /  Recommendations:   Please see A&P under problem oriented charting for assessment of the patient's acute and chronic Sherri conditions.   As always, pt is advised that if symptoms worsen or new symptoms arise, they should go to an urgent care facility or to to ER for further evaluation.   Consent and Sherri Decision Making:   Patient discussed with Sherri Mcmillan  This is a telephone encounter between [facility] and [facility] on 08/30/2018 for cough. The visit was conducted with the patient located at home and Sherri Mcmillan at [facility]. The patient's identity was confirmed using their DOB and current address. The patient has consented to being evaluated through a telephone encounter and understands the associated risks (an examination cannot be done and the patient may need to come in for an appointment) / benefits (allows the patient to remain at home, decreasing exposure to coronavirus). I personally spent 15 minutes on Sherri discussion.

## 2018-08-31 NOTE — Progress Notes (Signed)
Medicine attending: Medical history, presenting problems, physical complaints, and medications, reviewed with resident physician Dr Hermine Messick on the day of the patient telephone consultation and I concur with her evaluation and management plan. Elderly woman w persistent dry cough, worse at night,  likely related to refractory GERD, on maximum medical Rx with a PPI & an H2 blocker. Tagamet recently withdrawn from market. Will substitute w Pepcid. Continue dexilant. Pt has appointment next week to discuss surgical options. Hx of AS S/P TAVR.

## 2018-09-11 ENCOUNTER — Telehealth (HOSPITAL_COMMUNITY): Payer: Self-pay | Admitting: *Deleted

## 2018-09-11 ENCOUNTER — Other Ambulatory Visit: Payer: Self-pay | Admitting: Physician Assistant

## 2018-09-11 DIAGNOSIS — K2100 Gastro-esophageal reflux disease with esophagitis, without bleeding: Secondary | ICD-10-CM

## 2018-09-11 NOTE — Telephone Encounter (Signed)
[id number] Contacted patient to follow-up on Cardiac Rehab referral. Informed patient that the department is still closed at this time due to COVID-19 restrictions. Patient has not been doing any home exercise and states that she's had a cold the past few weeks. Patient was mailed a packet with the exercise stretches and heart healthy eating on 08/18/2018, but states she didn't receive it. I verified patient's mailing address and will resend the exercise stretches, heart healthy handout, and locations in the area that offer aid to people having difficulty purchasing food. Will continue to follow-up with patient.  Artist Pais, MS, ACSM CEP

## 2018-09-22 ENCOUNTER — Other Ambulatory Visit: Payer: Self-pay | Admitting: Internal Medicine

## 2018-09-22 DIAGNOSIS — K2100 Gastro-esophageal reflux disease with esophagitis, without bleeding: Secondary | ICD-10-CM

## 2018-09-29 ENCOUNTER — Telehealth: Payer: Self-pay | Admitting: Internal Medicine

## 2018-09-29 NOTE — Telephone Encounter (Signed)
Returned call to patient to clarify requests. VM has not been set up so unable to leave message. Kinnie Feil, RN, BSN

## 2018-09-29 NOTE — Telephone Encounter (Signed)
Pt would like the physician to prescribe for her headaches and nerves, pt has an appt with physician on 07/02 that was the earliest.  [id number] - [place], [place] - [address]

## 2018-10-09 ENCOUNTER — Telehealth (HOSPITAL_COMMUNITY): Payer: Self-pay | Admitting: *Deleted

## 2018-10-09 NOTE — Telephone Encounter (Addendum)
Spoke with patient regarding cardiac rehab. [name] did receive thg to follow the diet reccomendations e diet and exercise in the mail and has been trying to follow the diet recommendations and exercise 2-3 times a week. [name] says she may be interested in participating in phase 2 cardiac rehab once cleared post surgery when the program reopens. Barnet Pall, RN,BSN 10/09/2018 3:12 PM

## 2018-10-13 ENCOUNTER — Telehealth: Payer: Self-pay

## 2018-10-13 NOTE — Telephone Encounter (Signed)
    COVID-19 Pre-Screening Questions:  . In the past 7 to 10 days have you had a cough,  shortness of breath, headache, congestion, fever (100 or greater) body aches, chills, sore throat, or sudden loss of taste or sense of smell? Headache but pt report she has Hx of Migraines . Have you been around anyone with known Covid 19. No . Have you been around anyone who is awaiting Covid 19 test results in the past 7 to 10 days? No . Have you been around anyone who has been exposed to Covid 19, or has mentioned symptoms of Covid 19 within the past 7 to 10 days? No  If you have any concerns/questions about symptoms patients report during screening (either on the phone or at threshold). Contact the provider seeing the patient or DOD for further guidance.  If neither are available contact a member of the leadership team.    Pt made aware to wear mask and no visitor. Pt would like for Dr. Harrell Gave to call her emergency contact during visit as he is her caregiver.

## 2018-10-17 ENCOUNTER — Encounter: Payer: Self-pay | Admitting: Cardiology

## 2018-10-17 ENCOUNTER — Other Ambulatory Visit: Payer: Self-pay

## 2018-10-17 ENCOUNTER — Ambulatory Visit (INDEPENDENT_AMBULATORY_CARE_PROVIDER_SITE_OTHER): Payer: Medicare Other | Admitting: Cardiology

## 2018-10-17 VITALS — BP 148/84 | HR 84 | Temp 98.1°F | Ht 60.0 in | Wt 121.0 lb

## 2018-10-17 DIAGNOSIS — Z01810 Encounter for preprocedural cardiovascular examination: Secondary | ICD-10-CM | POA: Diagnosis not present

## 2018-10-17 DIAGNOSIS — I10 Essential (primary) hypertension: Secondary | ICD-10-CM

## 2018-10-17 DIAGNOSIS — Z952 Presence of prosthetic heart valve: Secondary | ICD-10-CM | POA: Diagnosis not present

## 2018-10-17 NOTE — Patient Instructions (Addendum)

## 2018-10-17 NOTE — Progress Notes (Signed)
Cardiology Office Note:    Date:  10/17/2018   ID:  Sherri [facility], DOB 01-Jan-1933, MRN [id number]  PCP:  Sherri DredgeHelberg, Justin, MD  Cardiologist:  Sherri Mcmillan , MD PhD  Referring MD: Sherri DredgeHelberg, Justin, MD   No chief complaint on file.   History of Present Illness:    Sherri Mcmillan is a 85 y.o. female with a hx of severe AS s/p TAVR, hiatal hernia/Barrett's esophagus, history of DVT, HTN, HLD who is seen for preoperative cardiac evaluation. Today is her first visit with me. She is currently followed by Dr. Clifton Mcmillan and [facility]ri [facility] post TAVR.  I attempted to call her caregiver Sherri Mcmillan at her request at the listed number, but I was unable to reach him. I did leave a voicemail.  Today:  Overall feeling well. Able to walk, climb stairs, though not intentionally exercising. She "has good days and bad days" but thinks she feels somewhat better from pre-TAVR, though she cannot recall any specific symptoms prior to TAVR (per note review, had DOE).   Denies chest pain, shortness of breath at rest or with normal exertion. No PND, orthopnea, (uses 3 pillows for reflux symptoms/comfort, not breathing issues) or unexpected weight gain. No syncope or palpitations. She has occasional mild LE edema from site for prior DVTs.   Does have severe reflux, especially related to eating and lying down. She is awaiting hiatal hernia redo surgery in 11/2018.   Past Medical History:  Diagnosis Date  . Anemia 12/14/2012  . Ataxia 06/24/2016  . Auditory hallucinations 08/19/2010   Has has auditory hallucinations, which seem to have worsened since death of her sister Sep 2011. [facility]  for 6 weeks after mother died, also with hallucinations.  B/C of severe mental illness and refusal to see pysch, I have refused to refill controlled meds. If she decides to pursue mental health, then she needs to take the initiative and sch the appt bc Lorri FrederickDonna Mcmillan has s  . Barrett's esophagus    Demonstrated on EGD 12/2010. EGD 09/17/2012 shows inflamed GE junctional mucosa without metaplasia, dysplasia, or malignancy.  . Cataract   . Chronic anxiety    [facility]  for 6 weeks after mother died. Complicated again by death of her sister 2010. 2012 developed hallucinations and I refused to refill controlled meds unless she see psych which she is not agreable to  . Chronic insomnia   . Depression   . DVT (deep venous thrombosis) (HCC)   . Gastroparesis    Demonstrated on GES 12/2010 by Dr Sherri Mcmillan  . GERD (gastroesophageal reflux disease)    describes " I sometimes have a hard time talking or swallowing" - relative to the reflux  . H/O hiatal hernia   . Hiatal hernia   . HLD (hyperlipidemia) 01/2010  . HTN (hypertension)    Controlled with 2 drug therapy  . Left leg DVT (HCC) 09/28/2012  . Migraine   . 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 Severe aortic stenosis     Past Surgical History:  Procedure Laterality Date  . 24 HOUR PH STUDY N/A 08/24/2017   Procedure: 24 HOUR PH STUDY IMPEDANCE;  Surgeon: Sherri Ristanis, Sherri L III, MD;  Location: [facility];  Service: Gastroenterology;  Laterality: N/A;  . ABDOMINAL HYSTERECTOMY    . APPENDECTOMY    . CARDIAC CATHETERIZATION    .  CATARACT EXTRACTION     left eye  . COLONOSCOPY  2000&2005   Dr.Magod  . DIRECT LARYNGOSCOPY   September 2008    preoperative diagnosis hoarseness with anterior right vocal cord lesion -  direct laryngoscopy and excisional biopsy of right anterior vocal cord lesion done by Dr. Lucia Gaskins  . ENDOVENOUS ABLATION SAPHENOUS VEIN W/ LASER Left 05-09-2014   EVLA left small saphenous vein by Sherri Jews MD  . ESOPHAGEAL MANOMETRY N/A 08/24/2017   Procedure: ESOPHAGEAL MANOMETRY (EM);  Surgeon: Sherri Stabler, MD;  Location: [facility];  Service: Gastroenterology;  Laterality: N/A;  .  ESOPHAGOGASTRODUODENOSCOPY  11/2010  . ESOPHAGOGASTRODUODENOSCOPY N/A 09/17/2012   Procedure: ESOPHAGOGASTRODUODENOSCOPY (EGD);  Surgeon: Sherri Dragon, MD;  [facility];  Service: Endoscopy;  Laterality: N/A;  . ESOPHAGOGASTRODUODENOSCOPY N/A 12/17/2012   Procedure: ESOPHAGOGASTRODUODENOSCOPY (EGD);  Surgeon: Sherri Bears, MD;  Location: [place];  Service: Gastroenterology;  Laterality: N/A;  . EXCISION MASS HEAD N/A 03/05/2016   Procedure: EXCISION POSTERIOR SCALP MASS;  Surgeon: Sherri Ok, MD;  Location: [place];  Service: General;  Laterality: N/A;  . EYE SURGERY Bilateral    cataracts removed-   . HEMORRHOID SURGERY    . KNEE ARTHROSCOPY    . LAPAROSCOPIC NISSEN FUNDOPLICATION N/A 07/31/9627   Procedure: LAPAROSCOPIC NISSEN FUNDOPLICATION;  Surgeon: Sherri Earls, MD;  Location: [facility];  Service: General;  Laterality: N/A;  . MENISCECTOMY   July 2002    preoperative diagnosis torn medial meniscus right knee, partial medial meniscectomy, debridement chondroplasty patellofemoral joint, done by Dr. French [facility]  . POLYPECTOMY  2000   Dr.Magod  . RIGHT/LEFT HEART CATH AND CORONARY ANGIOGRAPHY N/A 04/06/2018   Procedure: RIGHT/LEFT HEART CATH AND CORONARY ANGIOGRAPHY;  Surgeon: Sherri Blanks, MD;  Location: [place] CV [facility];  Service: Cardiovascular;  Laterality: N/A;  . ROTATOR CUFF REPAIR  09/21/2011   rt shoulder  . TEE WITHOUT CARDIOVERSION N/A 05/30/2018   Procedure: TRANSESOPHAGEAL ECHOCARDIOGRAM (TEE);  Surgeon: Sherri Blanks, MD;  Location: [place];  Service: Open Heart Surgery;  Laterality: N/A;  . TOOTH EXTRACTION Bilateral 04/05/2018   Procedure: Extraction of tooth #'s 6 and 29 with alveoloplasty and gross debridement of remaining teeth;  Surgeon: Sherri Mc[facility]an, DDS;  Location: [place];  Service: Oral Surgery;  Laterality: Bilateral;  . TRANSCATHETER AORTIC VALVE REPLACEMENT, TRANSFEMORAL N/A 05/30/2018   Procedure: TRANSCATHETER AORTIC VALVE  REPLACEMENT, TRANSFEMORAL - MEDTRONIC VALVE;  Surgeon: Sherri Blanks, MD;  Location: [place];  Service: Open Heart Surgery;  Laterality: N/A;    Current Medications: Current Outpatient Medications on File Prior to Visit  Medication Sig  . amLODipine (NORVASC) 10 MG tablet TAKE 1 TABLET BY MOUTH EVERY DAY  . amoxicillin (AMOXIL) 500 MG tablet Take 4 tablets (2,000 mg) one hour prior to all dental visits.  Marland Kitchen buPROPion (WELLBUTRIN XL) 150 MG 24 hr tablet TAKE 1 TABLET BY MOUTH EVERY DAY  . chlorthalidone (HYGROTON) 25 MG tablet Take 1 tablet (25 mg total) by mouth daily.  . clopidogrel (PLAVIX) 75 MG tablet Take 75 mg by mouth daily.  Marland Kitchen DEXILANT 60 MG capsule TAKE 1 CAPSULE BY MOUTH EVERY DAY  . diclofenac sodium (VOLTAREN) 1 % GEL   . famotidine (PEPCID) 20 MG tablet Take 1 tablet (20 mg total) by mouth 2 (two) times daily.  . nortriptyline (PAMELOR) 25 MG capsule Take 25 mg by mouth at bedtime.  . potassium chloride (K-DUR,KLOR-CON) 10 MEQ tablet Take 10 mEq by mouth  daily.  Marland Kitchen PROAIR HFA 108 (90 Base) MCG/ACT inhaler INHALE 2 PUFFS INTO THE LUNGS EVERY 4 (FOUR) HOURS AS NEEDED FOR WHEEZING OR SHORTNESS OF BREATH.  . sucralfate (CARAFATE) 1 GM/10ML suspension TAKE 10 MLS (1 G TOTAL) BY MOUTH 2 (TWO) TIMES DAILY.   No current facility-administered medications on file prior to visit.      Allergies:   Aspirin   Social History   Socioeconomic History  . Marital status: Widowed    Spouse name: Not on file  . Number of children: 1  . Years of education: some college  . Highest education level: Not on file  Occupational History  . Occupation: Retired-worked in [employer]  Social Needs  . Financial resource strain: Hard  . Food insecurity    Worry: Often true    Inability: Often true  . Transportation needs    Medical: Yes    Non-medical: No  Tobacco Use  . Smoking status: Former Smoker    Years: 50.00    Types: Cigarettes    Quit date: 06/02/1983    Years since quitting: 35.4   . Smokeless tobacco: Never Used  Substance and Sexual Activity  . Alcohol use: No    Alcohol/week: 0.0 standard drinks    Comment: quit 24 years ago  . Drug use: No  . Sexual activity: Not Currently    Birth control/protection: Post-menopausal, None  Lifestyle  . Physical activity    Days per week: Not on file    Minutes per session: Not on file  . Stress: Not on file  Relationships  . Social connections    Talks on phone: More than three times a week    Gets together: More than three times a week    Attends religious service: More than 4 times per year    Active member of club or organization: Yes    Attends meetings of clubs or organizations: More than 4 times per year    Relationship status: Widowed  Other Topics Concern  . 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 The patient nolonger drinks alcohol.   The patient is widowed.   Patient has an adopted daughter living in [place].   Education: some college.      Family History: The patient's family history includes Diabetes in her brother; Heart attack in her father and mother; Heart disease in her mother; Hypertension in her father and mother. There is no history of Stroke, Cancer, Colon cancer, Anesthesia problems, Hypotension, Malignant hyperthermia, Pseudochol deficiency, Stomach cancer, Rectal cancer, or Esophageal cancer.  ROS:   Please see the history of present illness.  Additional pertinent ROS:  Constitutional: Negative for chills, fever, night sweats, unintentional weight loss  HENT: Negative for ear pain and hearing loss.   Eyes: Negative for loss of vision and eye pain.  Respiratory: Negative  for cough, sputum, shortness of breath, wheezing.   Cardiovascular: See HPI. Gastrointestinal: Negative for abdominal pain, melena, and hematochezia.  Genitourinary: Negative for dysuria and hematuria.  Musculoskeletal: Negative for falls and myalgias.  Skin: Negative for itching and rash.  Neurological: Negative for focal  weakness, focal sensory changes and loss of consciousness.  Endo/Heme/Allergies: Does not bruise/bleed easily.    EKGs/Labs/Other Studies Reviewed:    The following studies were reviewed today: Echo 06/29/18 Normal LV EF, grade 2 DD, stable TAVR  Cath 04/06/18 No CAD, LVEDP normal, Severe AS  EKG:  EKG is personally reviewed.  The ekg ordered 06/08/18 demonstrates NSR  Recent Labs: 05/26/2018: ALT 15; B Natriuretic Peptide 75.5 05/31/2018: Magnesium 1.8 06/02/2018: Hemoglobin 11.6; Platelets 102 06/08/2018: BUN 17; Creatinine, Ser 0.76; Potassium 4.9; Sodium 141  Recent Lipid Panel    Component Value Date/Time   CHOL 220 (H) 06/25/2016 0251   TRIG 44 06/25/2016 0251   HDL 93 06/25/2016 0251   CHOLHDL 2.4 06/25/2016 0251   VLDL 9 06/25/2016 0251   LDLCALC 118 (H) 06/25/2016 0251    Physical Exam:    VS:  BP (!) 148/84   Pulse 84   Temp 98.1 F (36.7 C)   Ht 5' (1.524 m)   Wt 121 lb (54.9 kg)   LMP 04/26/1950   SpO2 100%   BMI 23.63 kg/m     Wt Readings from Last 3 Encounters:  10/17/18 121 lb (54.9 kg)  06/29/18 120 lb 12.8 oz (54.8 kg)  06/08/18 117 lb 1.9 oz (53.1 kg)     GEN: Well nourished, well developed in no acute distress HEENT: Normal NECK: No JVD; No carotid bruits LYMPHATICS: No lymphadenopathy CARDIAC: regular rhythm, normal S1 and S2, no rubs, gallops. 1/6 systolic ejection murmur. Radial and DP pulses 2+ bilaterally. RESPIRATORY:  Clear to auscultation without rales, wheezing or rhonchi  ABDOMEN: Soft, non-tender, non-distended MUSCULOSKELETAL:  No edema; No deformity  SKIN: Warm and dry NEUROLOGIC:  Alert and oriented x 3  PSYCHIATRIC:  Normal affect   ASSESSMENT:    1. Preop cardiovascular exam   2. Essential hypertension   3. S/P TAVR (transcatheter aortic valve replacement)    PLAN:    Preoperative cardiovascular risk assessment: She can achieve >4 METs without symptoms. Now s/p TAVR for severe AS. RCRI currently 0. No indication for cardiovascular testing prior to hiatal hernia surgery. Of note, please see Arlina RobesKathryn Mcmillan's note from 06/2018 regarding clopidogrel and surgery.  Severe AS s/p TAVR 05/2018: followed by Sherri Mcmillan and Dr. Clifton Mcmillan. Doing well. Last echo with normal valve. -has SBE prophylaxis with amoxicillin -on clopidogrel (aspirin allergy)  HTN: does not check at home. Slightly elevated today, which she attributes to wearing a mask for the visit -continue amlodipine 10 mg, chlorthalidone 25 mg  Has a history of HLD (no recent lipids), but no CAD on cath. Given age, will not pursue statin at this time  Plan for follow up: 1 year or sooner PRN  Medication Adjustments/Labs and Tests Ordered: Current medicines are reviewed at length with the patient today.  Concerns regarding medicines are outlined above.  No orders of the defined types were placed in this encounter.  No orders of the defined types were placed in this encounter.  Patient Instructions  Medication Instructions:  Your Physician recommend you continue on your current medication as directed.    If you need a refill on your cardiac medications before your next appointment, please call your pharmacy.   Lab work: None  Testing/Procedures: None  Follow-Up: At [facility], you and your health needs are our priority.  As part of our continuing mission to provide you with exceptional heart care, we have created designated Provider Care Teams.  These Care Teams include your primary Cardiologist (  physician) and Advanced Practice Providers (APPs -  Physician Assistants and Nurse Practitioners) who all work together  to provide you with the care you need, when you need it. You will need a follow up appointment in 1 years.  Please call our office 2 months in advance to schedule this appointment.  You may see Dr. Cristal Deerhristopher or one of the following Advanced Practice Providers on your designated Care Team:   Theodore DemarkRhonda Barrett, PA-C . Joni ReiningKathryn Lawrence, DNP, ANP        Signed, Sherri Mcmillan , MD PhD 10/17/2018 1:39 PM    [facility]

## 2018-10-19 ENCOUNTER — Telehealth (HOSPITAL_COMMUNITY): Payer: Self-pay

## 2018-10-19 NOTE — Telephone Encounter (Signed)
Pt insurance is active and benefits verified through [facility]. Co-pay $0.00, DED $0.00/$0.00 met, out of pocket $6,700.00/$321.70 met, co-insurance 0%. No pre-authorization required. Passport, 10/19/2018 @ 3:12PM, REF# [id number]  2ndary insurance is active and benefits verified through Medicaid. Co-pay $0.00, DED $0.00/$0.00 met, out of pocket $0.00/$0.00 met, co-insurance 0%. No pre-authorization required. Passport, 10/19/2018 @ 3:16PM, REF# [id number]

## 2018-10-26 ENCOUNTER — Ambulatory Visit (INDEPENDENT_AMBULATORY_CARE_PROVIDER_SITE_OTHER): Payer: Medicare Other | Admitting: Internal Medicine

## 2018-10-26 ENCOUNTER — Encounter: Payer: Self-pay | Admitting: Internal Medicine

## 2018-10-26 ENCOUNTER — Other Ambulatory Visit: Payer: Self-pay

## 2018-10-26 VITALS — BP 127/65 | HR 66 | Temp 98.9°F | Ht 60.0 in | Wt 117.8 lb

## 2018-10-26 DIAGNOSIS — I10 Essential (primary) hypertension: Secondary | ICD-10-CM

## 2018-10-26 DIAGNOSIS — Z23 Encounter for immunization: Secondary | ICD-10-CM

## 2018-10-26 DIAGNOSIS — K219 Gastro-esophageal reflux disease without esophagitis: Secondary | ICD-10-CM | POA: Diagnosis not present

## 2018-10-26 DIAGNOSIS — G43709 Chronic migraine without aura, not intractable, without status migrainosus: Secondary | ICD-10-CM

## 2018-10-26 DIAGNOSIS — G43909 Migraine, unspecified, not intractable, without status migrainosus: Secondary | ICD-10-CM

## 2018-10-26 DIAGNOSIS — K2100 Gastro-esophageal reflux disease with esophagitis, without bleeding: Secondary | ICD-10-CM

## 2018-10-26 DIAGNOSIS — D500 Iron deficiency anemia secondary to blood loss (chronic): Secondary | ICD-10-CM | POA: Diagnosis not present

## 2018-10-26 DIAGNOSIS — G43C0 Periodic headache syndromes in child or adult, not intractable: Secondary | ICD-10-CM

## 2018-10-26 DIAGNOSIS — Z79899 Other long term (current) drug therapy: Secondary | ICD-10-CM

## 2018-10-26 MED ORDER — SUMATRIPTAN SUCCINATE 50 MG PO TABS: 50.0000 mg | ORAL_TABLET | ORAL | 0 refills | Status: DC | PRN

## 2018-10-26 MED ORDER — AMLODIPINE BESYLATE 10 MG PO TABS: 10.0000 mg | ORAL_TABLET | Freq: Every day | ORAL | 2 refills | Status: DC

## 2018-10-26 MED ORDER — CHLORTHALIDONE 25 MG PO TABS: 25.0000 mg | ORAL_TABLET | Freq: Every day | ORAL | 3 refills | Status: DC

## 2018-10-26 NOTE — Patient Instructions (Signed)
Thank you for allowing me to provide your care. I have sent out a medication for your migraines. I would like to see you back after your surgery or sooner if anything arises.

## 2018-10-26 NOTE — Progress Notes (Signed)
   CC: F/u GERD, IDA, Migraines  HPI:  [name] is a 85 y.o. female with PMHx listed below presenting for F/u GERD, IDA, and Migraines. Please see the A&P for the status of the patient's chronic medical problems.  Past Medical History:  Diagnosis Date  . Anemia 12/14/2012  . Ataxia 06/24/2016  . Auditory hallucinations 08/19/2010   Has has auditory hallucinations, which seem to have worsened since death of her sister 27-Jan-2010. Admission to [facility] (90s or early 2000)  for 6 weeks after mother died, also with hallucinations.  B/C of severe mental illness and refusal to see pysch, I have refused to refill controlled meds. If she decides to pursue mental health, then she needs to take the initiative and sch the appt bc Lorie Phenix has s  . Barrett's esophagus    Demonstrated on EGD 01/28/11. EGD 09/17/2012 shows inflamed GE junctional mucosa without metaplasia, dysplasia, or malignancy.  . Cataract   . Chronic anxiety    Admission to [facility] (90s or early 2000)  for 6 weeks after mother died. Complicated again by death of her sister 2010. 2012 developed hallucinations and I refused to refill controlled meds unless she see psych which she is not agreable to  . Chronic insomnia   . Depression   . DVT (deep venous thrombosis) ([place])   . Gastroparesis    Demonstrated on GES 01-28-11 by Dr Karmen Stabs  . GERD (gastroesophageal reflux disease)    describes " I sometimes have a hard time talking or swallowing" - relative to the reflux  . H/O hiatal hernia   . Hiatal hernia   . HLD (hyperlipidemia) 01/2010  . HTN (hypertension)    Controlled with 2 drug therapy  . Left leg DVT ([place]) 09/28/2012  . Migraine   . 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 Severe aortic stenosis    Review of Systems: Performed and all others negative.  Physical Exam: Vitals:   10/26/18 1453  BP: 127/65  Pulse: 66  Temp: 98.9 F (37.2 C)   TempSrc: Oral  SpO2: 100%  Weight: 117 lb 12.8 oz (53.4 kg)  Height: 5' (1.524 m)   General: Well nourished female in no acute distress Pulm: Good air movement with no wheezing or crackles  CV: RRR, no murmurs, no rubs   Assessment & Plan:   See Encounters Tab for problem based charting.  Patient discussed with Dr. Evette Doffing

## 2018-10-27 ENCOUNTER — Encounter: Payer: Self-pay | Admitting: Internal Medicine

## 2018-10-27 DIAGNOSIS — G43909 Migraine, unspecified, not intractable, without status migrainosus: Secondary | ICD-10-CM | POA: Insufficient documentation

## 2018-10-27 DIAGNOSIS — Z23 Encounter for immunization: Secondary | ICD-10-CM | POA: Diagnosis not present

## 2018-10-27 LAB — CBC
Hematocrit: 34.4 % (ref 34.0–46.6)
Hemoglobin: 11.9 g/dL (ref 11.1–15.9)
MCH: 30.2 pg (ref 26.6–33.0)
MCHC: 34.6 g/dL (ref 31.5–35.7)
MCV: 87 fL (ref 79–97)
Platelets: 171 x10E3/uL (ref 150–450)
RBC: 3.94 x10E6/uL (ref 3.77–5.28)
RDW: 13.1 % (ref 11.7–15.4)
WBC: 3.1 x10E3/uL — ABNORMAL LOW (ref 3.4–10.8)

## 2018-10-27 NOTE — Assessment & Plan Note (Signed)
Following with GI. Symptoms better with addition of famotidine. Plan for Nissen fundoplication with Dr. Hassell Done on 12/01/2018.

## 2018-10-27 NOTE — Assessment & Plan Note (Signed)
Repeat CBC today with normal Hgb.

## 2018-10-27 NOTE — Assessment & Plan Note (Signed)
Well controlled on chlorthalidone 25 mg QD and Amlodipine 10 mg QD. She is not experiencing any adverse effects from the medications. No issues affording the medications. Last BMP with normal renal function and potassium.   A/P: - Well controlled. Continue chlorthalidone 25 mg QD and Amlodipine 10 mg QD

## 2018-10-27 NOTE — Assessment & Plan Note (Signed)
History of migraines with pervious use of acute medication. Describes it as bilateral and throbbing with phonophobia. She denies photophobia and N/V. She also endorses pain going into her neck. She has not tried NSAIDs due to her GERD. She states that the frequency of headache varies.   A/P: - History is consistent with tension type and migraine type headaches  - Trial of Sumitriptan. Discussed using the medication no more than 1-2 times per week.  - I do not think she needs a prophylactic medication at this point.

## 2018-10-30 NOTE — Progress Notes (Signed)
Internal Medicine Clinic Attending  Case discussed with Dr. Helberg at the time of the visit.  We reviewed the resident's history and exam and pertinent patient test results.  I agree with the assessment, diagnosis, and plan of care documented in the resident's note.    

## 2018-11-16 ENCOUNTER — Other Ambulatory Visit: Payer: Self-pay | Admitting: Internal Medicine

## 2018-11-16 DIAGNOSIS — F329 Major depressive disorder, single episode, unspecified: Secondary | ICD-10-CM

## 2018-11-16 DIAGNOSIS — F419 Anxiety disorder, unspecified: Secondary | ICD-10-CM

## 2018-11-19 ENCOUNTER — Other Ambulatory Visit: Payer: Self-pay | Admitting: Internal Medicine

## 2018-11-19 DIAGNOSIS — G43709 Chronic migraine without aura, not intractable, without status migrainosus: Secondary | ICD-10-CM

## 2018-11-22 ENCOUNTER — Other Ambulatory Visit: Payer: Self-pay | Admitting: Physician Assistant

## 2018-11-23 ENCOUNTER — Other Ambulatory Visit: Payer: Self-pay | Admitting: Physician Assistant

## 2018-11-23 NOTE — Telephone Encounter (Signed)
Looks like labs back in [place] showed her potassium was in good range and that she did not need the medication. Unless she has had more recent labs with low potassium levels, I would not restart this as it could push her potassium into a dangerously high range which can cause cardiac arrhythmias.

## 2018-11-28 ENCOUNTER — Other Ambulatory Visit (HOSPITAL_COMMUNITY)
Admission: RE | Admit: 2018-11-28 | Discharge: 2018-11-28 | Disposition: A | Discharge status: Home / Self Care | Payer: Medicare Other | Source: Ambulatory Visit | Attending: Surgery | Admitting: Surgery

## 2018-11-28 DIAGNOSIS — Z20828 Contact with and (suspected) exposure to other viral communicable diseases: Secondary | ICD-10-CM | POA: Diagnosis not present

## 2018-11-28 DIAGNOSIS — Z01812 Encounter for preprocedural laboratory examination: Secondary | ICD-10-CM | POA: Diagnosis present

## 2018-11-28 LAB — SARS CORONAVIRUS 2 (TAT 6-24 HRS): SARS Coronavirus 2: NEGATIVE

## 2018-11-28 NOTE — Patient Instructions (Signed)
YOU NEED TO HAVE A COVID 19 TEST ON_8/4______ @_1 :10______, THIS TEST MUST BE DONE BEFORE SURGERY, COME  [address], [place] , [address].  ONCE YOUR COVID TEST IS COMPLETED, PLEASE BEGIN THE QUARANTINE INSTRUCTIONS AS OUTLINED IN YOUR HANDOUT.                Sherri Mcmillan    Your procedure is scheduled on: Friday 12/01/18   Report to [facility] Main  Entrance  Report to admitting at 7:45 AM   1 VISITOR IS ALLOWED TO WAIT IN WAITING ROOM  ONLY DAY OF YOUR SURGERY.  No family in Short stay at this time   Call this number if you have problems the morning of surgery [phone]    Remember: Do not eat food or drink liquids :After Midnight . BRUSH YOUR TEETH MORNING OF SURGERY AND RINSE YOUR MOUTH OUT, NO CHEWING GUM CANDY OR MINTS.     Take these medicines the morning of surgery with A SIP OF WATER: Wellbutrin, Amlodipine                                 You may not have any metal on your body including hair pins and              piercings              Do not wear jewelry, make-up, lotions, powders or perfumes, deodorant             Do not wear nail polish.  Do not shave  48 hours prior to surgery.       .   Do not bring valuables to the hospital. [place].  Contacts, dentures or bridgework may not be worn into surgery.  Marland Kitchen      Special Instructions: N/A                         [facility] - Preparing for Surgery  Before surgery, you can play an important role.   Because skin is not sterile, your skin needs to be as free of germs as possible .  You can reduce the number of germs on your skin by washing with CHG (chlorahexidine gluconate) soap before surgery.   CHG is an antiseptic cleaner which kills germs and bonds with the skin to continue killing germs even after washing. Please DO NOT use if you have an allergy to CHG or antibacterial soaps .  If your skin becomes reddened/irritated stop using  the CHG and inform your nurse when you arrive at Short Stay. Do not shave (including legs and underarms) for at least 48 hours prior to the first CHG shower.Please follow these instructions carefully:  1.  Shower with CHG Soap the night before surgery and the  morning of Surgery.  2.  If you choose to wash your hair, wash your hair first as usual with your  normal  shampoo.  3.  After you shampoo, rinse your hair and body thoroughly to remove the  shampoo.                                        4.  Use CHG as you would any other liquid soap.  You can apply chg directly  to the skin and wash                       Gently with a scrungie or clean washcloth.  5.  Apply the CHG Soap to your body ONLY FROM THE NECK DOWN.   Do not use on face/ open                           Wound or open sores. Avoid contact with eyes, ears mouth and genitals (private parts).                       Wash face,  Genitals (private parts) with your normal soap.             6.  Wash thoroughly, paying special attention to the area where your surgery  will be performed.  7.  Thoroughly rinse your body with warm water from the neck down.  8.  DO NOT shower/wash with your normal soap after using and rinsing off  the CHG Soap.             9.  Pat yourself dry with a clean towel.            10.  Wear clean pajamas.            11.  Place clean sheets on your bed the night of your first shower and do not  sleep with pets. Day of Surgery : Do not apply any lotions/deodorants the morning of surgery.  Please wear clean clothes to the hospital/surgery center.  FAILURE TO FOLLOW THESE INSTRUCTIONS MAY RESULT IN THE CANCELLATION OF YOUR SURGERY PATIENT SIGNATURE_________________________________  NURSE SIGNATURE__________________________________  ________________________________________________________________________

## 2018-11-29 ENCOUNTER — Telehealth: Payer: Self-pay

## 2018-11-29 ENCOUNTER — Encounter (HOSPITAL_COMMUNITY): Payer: Self-pay

## 2018-11-29 ENCOUNTER — Encounter (HOSPITAL_COMMUNITY)
Admission: RE | Admit: 2018-11-29 | Discharge: 2018-11-29 | Disposition: A | Discharge status: Home / Self Care | Payer: Medicare Other | Source: Ambulatory Visit | Attending: Surgery | Admitting: Surgery

## 2018-11-29 ENCOUNTER — Other Ambulatory Visit: Payer: Self-pay

## 2018-11-29 DIAGNOSIS — R9431 Abnormal electrocardiogram [ECG] [EKG]: Secondary | ICD-10-CM | POA: Insufficient documentation

## 2018-11-29 DIAGNOSIS — Z01818 Encounter for other preprocedural examination: Secondary | ICD-10-CM | POA: Insufficient documentation

## 2018-11-29 LAB — BASIC METABOLIC PANEL
Anion gap: 6 (ref 5–15)
BUN: 18 mg/dL (ref 8–23)
CO2: 31 mmol/L (ref 22–32)
Calcium: 9.8 mg/dL (ref 8.9–10.3)
Chloride: 104 mmol/L (ref 98–111)
Creatinine, Ser: 0.9 mg/dL (ref 0.44–1.00)
GFR calc Af Amer: >60 mL/min (ref >60)
GFR calc non Af Amer: 59 mL/min — ABNORMAL LOW (ref >60)
Glucose, Bld: 89 mg/dL (ref 70–99)
Potassium: 4.4 mmol/L (ref 3.5–5.1)
Sodium: 141 mmol/L (ref 135–145)

## 2018-11-29 LAB — CBC
HCT: 38.4 % (ref 36.0–46.0)
Hemoglobin: 12 g/dL (ref 12.0–15.0)
MCH: 28.6 pg (ref 26.0–34.0)
MCHC: 31.3 g/dL (ref 30.0–36.0)
MCV: 91.6 fL (ref 80.0–100.0)
Platelets: 135 10*3/uL — ABNORMAL LOW (ref 150–400)
RBC: 4.19 MIL/uL (ref 3.87–5.11)
RDW: 13.1 % (ref 11.5–15.5)
WBC: 3.3 10*3/uL — ABNORMAL LOW (ref 4.0–10.5)
nRBC: 0 % (ref 0.0–0.2)

## 2018-11-29 NOTE — Progress Notes (Signed)
Plavix was stopped 8/1 per Dr. Tarri Abernethy

## 2018-11-29 NOTE — Telephone Encounter (Signed)
   [facility] Pre-operative Risk Assessment    Request for surgical clearance:  1. What type of surgery is being performed? Hiatal Hernia Redo   2. When is this surgery scheduled? 12/01/18   3. What type of clearance is required (medical clearance vs. Pharmacy clearance to hold med vs. Both)? Both  4. Are there any medications that need to be held prior to surgery and how long? Plavix   5. Practice name and name of physician performing surgery? Dr. Hassell Done, no facility listed; [facility] sent fax for clearance   6. What is your office phone number [phone]    7.   What is your office fax number [phone]  8.   Anesthesia type (None, local, MAC, general) ? [place] 11/29/2018, 4:03 PM  _________________________________________________________________   (provider comments below)

## 2018-11-30 NOTE — Telephone Encounter (Signed)
   Primary Cardiologist: Lauree Chandler, MD  Chart reviewed as part of pre-operative protocol coverage. She was evaluated outpatient by Dr. Harrell Gave 10/17/2018 for preoperative assessment and deeemed an acceptable risk for planner procedure without further cardiovascular testing based on ACC/AHA guidelines.   Per Dr. Angelena Form (who follows patient s/p TAVR), patient can hold plavix x5 days prior to her upcoming hernia repair. She should restart this medication when cleared to do so by Dr. Hassell Done.  I will route this recommendation to the requesting party via Epic fax function and remove from pre-op pool.  Please call with questions.  Abigail Butts, PA-C 11/30/2018, 8:30 AM

## 2018-11-30 NOTE — H&P (Signed)
Sherri Mcmillan Location: [facility] Office Patient #: [id number] DOB: [birth year] Single / Language: Cleophus Molt / Race: Black or African American Female   History of Present Illness The patient is a 85 year old AA female who presents with a hiatal hernia. She had a prior laparoscopic Nissen fundoplication by me and Dr. Rush Farmer in January 2015. She had a 3 suture posterior repair of her hiatus with time knots and a Nissen wrap around a 56 dilator. About 2 years ago she began having reflux symptoms and these are gotten much worse. She was referred by Dr. Loletha Carrow. She is now 85 years old and is hoarse and is feeling like she can't go on like this. She is willing to take the risk and recognizes she is at significant risk. I want to get an upper GI series and then schedule her for laparoscopic takedown and repair were hiatus possible gastropexy and G-tube placement.   Past Surgical History Colon Polyp Removal - Colonoscopy  Hemorrhoidectomy   Diagnostic Studies History  Colonoscopy  1-5 years ago  Medication History  Dexilant (60MG  Capsule DR, Oral) Active. Metoclopramide HCl (5MG  Tablet, Oral) Active. RaNITidine HCl (300MG  Tablet, Oral) Active. Sertraline HCl (50MG  Tablet, Oral) Active. AmLODIPine Besylate (10MG  Tablet, Oral) Active. Sucralfate (1GM Tablet, Oral) Active. Ventolin HFA (108 (90 Base)MCG/ACT Aerosol Soln, Inhalation as needed) Active. Stool Softener (100MG  Capsule, Oral) Active. Medications Reconciled  Social History Alcohol use  Remotely quit alcohol use. Illicit drug use  Remotely quit drug use.  Family History Arthritis  Brother, Father, Mother. Hypertension  Mother.  Other Problems  Migraine Headache   Review of Systems  General Present- Fatigue and Night Sweats. Not Present- Appetite Loss, Chills, Fever, Weight Gain and Weight Loss. HEENT Present- Hoarseness and Visual Disturbances. Not Present- Earache, Hearing Loss, Nose Bleed, Oral Ulcers,  Ringing in the Ears, Seasonal Allergies, Sinus Pain, Sore Throat, Wears glasses/contact lenses and Yellow Eyes. Cardiovascular Present- Swelling of Extremities. Not Present- Chest Pain, Difficulty Breathing Lying Down, Leg Cramps, Palpitations, Rapid Heart Rate and Shortness of Breath. Gastrointestinal Present- Excessive gas and Indigestion. Not Present- Abdominal Pain, Bloating, Bloody Stool, Change in Bowel Habits, Chronic diarrhea, Constipation, Difficulty Swallowing, Gets full quickly at meals, Hemorrhoids, Nausea, Rectal Pain and Vomiting. Musculoskeletal Present- Swelling of Extremities. Not Present- Back Pain, Joint Pain, Joint Stiffness, Muscle Pain and Muscle Weakness. Neurological Present- Headaches. Not Present- Decreased Memory, Fainting, Numbness, Seizures, Tingling, Tremor, Trouble walking and Weakness. Psychiatric Present- Anxiety, Change in Sleep Pattern and Depression. Not Present- Bipolar, Fearful and Frequent crying. Endocrine Present- Hair Changes. Not Present- Cold Intolerance, Excessive Hunger, Heat Intolerance, Hot flashes and New Diabetes.  Vitals  Weight: 115.38 lb Height: 60in Body Surface Area: 1.48 m Body Mass Index: 22.53 kg/m  Temp.: 98.65F  Pulse: 69 (Regular)  P.OX: 94% (Room air) BP: 134/80(Sitting, Left Arm, Standard)   Physical Exam  General Note: older AAF who is hoarse thin HEENT exam glasses Neck supple chest clear heart 2/6 systolic ejection murmur Chest clear Abdomen flat nontender Extremities range of motion and ambulatory.     Assessment & Plan   HIATAL HERNIA WITH GERD (K21.9) Impression: failed Nissen done in 2015. Proceed with revision of Nissen possibly with g pexy or takedown alone.     Matt B. Hassell Done, MD, FACS

## 2018-11-30 NOTE — Telephone Encounter (Signed)
OK to hold Plavix.      

## 2018-11-30 NOTE — Telephone Encounter (Signed)
Spoke to pt. She stated she has been off her Plavix since Monday. Informed pt she is cleared for surgery. Pt verbalized thanks. She did have some questions as to when she needs to be at the hospital and if she will need to stay overnight. I told pt she will need to call Dr. Hassell Done to find out and gave her the requesting office number.   Faxing this information to Dr. Hassell Done and requesting office via [place] fax.

## 2018-11-30 NOTE — Progress Notes (Signed)
Anesthesia Chart Review   Case: [id number] Date/Time: 12/01/18 0930   Procedure: REDO HIATAL HERNIA REPAIR TAKEDOWN NISEEN (N/A )   Anesthesia type: General   Pre-op diagnosis: RECURRENT HIATAL HERNIA   [facility] ROOM 01 / [facility]   Surgeon: Luretha MurphyMartin, Matthew, MD      DISCUSSION:85 y.o. former smoker (quit 06/02/83) with h/o anxiety, depression, auditory hallucination, anemia, HTN, left LE DVT 2014, GERD, hiatal hernia, HLD, s/p TAVR 05/30/2018, recurrent hiatal hernia scheduled for above procedure 12/01/2018 with Dr. Luretha MurphyMatthew Martin.   Clearance received from cardiology 11/30/2018.  Per Judy PimpleKrista Kroeger, PA-C, "She was evaluated outpatient by Dr. Cristal Deerhristopher 10/17/2018 for preoperative assessment and deeemed an acceptable risk for planner procedure without further cardiovascular testing based on ACC/AHA guidelines.  Per Dr. Clifton JamesMcAlhany (who follows patient s/p TAVR), patient can hold plavix x5 days prior to her upcoming hernia repair. She should restart this medication when cleared to do so by Dr. Daphine DeutscherMartin."  Anticipate pt can proceed with planned procedure barring acute status change.   VS: BP (!) 126/59 (BP Location: Left Arm)   Pulse 62   Temp 37.3 C (Oral)   Resp 18   Ht 4\' 11"  (1.499 m)   Wt 52.7 kg   LMP 04/26/1950   SpO2 100%   BMI 23.48 kg/m   PROVIDERS: Levora DredgeHelberg, Justin, MD is PCP last seen 10/26/2018  Jodelle Redhristopher, Bridgette, MD is Cardiologist  LABS: Labs reviewed: Acceptable for surgery. and routed to surgeon (all labs ordered are listed, but only abnormal results are displayed)  Labs Reviewed  BASIC METABOLIC PANEL - Abnormal; Notable for the following components:      Result Value   GFR calc non Af Amer 59 (*)    All other components within normal limits  CBC - Abnormal; Notable for the following components:   WBC 3.3 (*)    Platelets 135 (*)    All other components within normal limits     IMAGES:   EKG: 11/29/2018 Rate 61 bpm Normal sinus rhythm Low voltage QRS Cannot  rule out Anterior infarct , age undetermined versus lead placement Abnormal ECG Since last tracing Poor R wave progression is now Present  CV: Echo 06/29/2018 IMPRESSIONS    1. The left ventricle has normal systolic function with an ejection fraction of 60-65%. The cavity size was normal. Left ventricular diastolic Doppler parameters are consistent with pseudonormalization No evidence of left ventricular regional wall  motion abnormalities.  2. The right ventricle has normal systolic function. The cavity was normal. There is no increase in right ventricular wall thickness.  3. The mitral valve is normal in structure. Mild thickening of the mitral valve leaflet.  4. The tricuspid valve is normal in structure.  5. Post TAVR 26 Medtronic Core Valve no PVL stable gradients.  6. The pulmonic valve was normal in structure.  7. No evidence of left ventricular regional wall motion abnormalities.  Stress Test 02/24/18  The left ventricular ejection fraction is normal (55-65%).  Nuclear stress EF: 64%.  There was no ST segment deviation noted during stress.  The study is normal.  This is a low risk study.   Normal resting and stress perfusion. No ischemia or infarction EF 64%  Past Medical History:  Diagnosis Date  . Anemia 12/14/2012  . Ataxia 06/24/2016  . Auditory hallucinations 08/19/2010   Has has auditory hallucinations, which seem to have worsened since death of her sister Sep 2011. [facility]  for 6 weeks  after mother died, also with hallucinations.  B/C of severe mental illness and refusal to see pysch, I have refused to refill controlled meds. If she decides to pursue mental health, then she needs to take the initiative and sch the appt bc Lorie Phenix has s  . Barrett's esophagus    Demonstrated on EGD 12/2010. EGD 09/17/2012 shows inflamed GE junctional mucosa without metaplasia, dysplasia, or malignancy.  . Cataract   . Chronic anxiety    Admission to  [facility] (90s or early 2000)  for 6 weeks after mother died. Complicated again by death of her sister 2010. 2012 developed hallucinations and I refused to refill controlled meds unless she see psych which she is not agreable to  . Chronic insomnia   . Depression   . DVT (deep venous thrombosis) ([place])   . Gastroparesis    Demonstrated on GES 12/2010 by Dr Karmen Stabs  . GERD (gastroesophageal reflux disease)    describes " I sometimes have a hard time talking or swallowing" - relative to the reflux  . H/O hiatal hernia   . Hiatal hernia   . HLD (hyperlipidemia) 01/2010  . HTN (hypertension)    Controlled with 2 drug therapy  . Insomnia 07/27/2006  . Left leg DVT ([place]) 09/28/2012  . Migraine   . Pulmonary nodule 02/10/2014   CT 06/2015 shows stable pulm nodules compared to prior study 2015, so likely benign.     . 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 Severe aortic stenosis     Past Surgical History:  Procedure Laterality Date  . [place] STUDY N/A 08/24/2017   Procedure: 24 HOUR PH STUDY IMPEDANCE;  Surgeon: Doran Stabler, MD;  [facility];  Service: Gastroenterology;  Laterality: N/A;  . ABDOMINAL HYSTERECTOMY    . APPENDECTOMY    . CARDIAC CATHETERIZATION    . CATARACT EXTRACTION     left eye  . COLONOSCOPY  2000&2005   Dr.Magod  . DIRECT LARYNGOSCOPY   September 2008    preoperative diagnosis hoarseness with anterior right vocal cord lesion -  direct laryngoscopy and excisional biopsy of right anterior vocal cord lesion done by Dr. Lucia Gaskins  . ENDOVENOUS ABLATION SAPHENOUS VEIN W/ LASER Left 05-09-2014   EVLA left small saphenous vein by Curt Jews MD  . ESOPHAGEAL MANOMETRY N/A 08/24/2017   Procedure: ESOPHAGEAL MANOMETRY (EM);  Surgeon: Doran Stabler, MD;  [facility];  Service: Gastroenterology;  Laterality: N/A;  . ESOPHAGOGASTRODUODENOSCOPY  11/2010  .  ESOPHAGOGASTRODUODENOSCOPY N/A 09/17/2012   Procedure: ESOPHAGOGASTRODUODENOSCOPY (EGD);  Surgeon: Lafayette Dragon, MD;  Location: [facility] ENDOSCOPY;  Service: Endoscopy;  Laterality: N/A;  . ESOPHAGOGASTRODUODENOSCOPY N/A 12/17/2012   Procedure: ESOPHAGOGASTRODUODENOSCOPY (EGD);  Surgeon: Jerene Bears, MD;  Location: [facility];  Service: Gastroenterology;  Laterality: N/A;  . EXCISION MASS HEAD N/A 03/05/2016   Procedure: EXCISION POSTERIOR SCALP MASS;  Surgeon: Ralene Ok, MD;  Location: [facility];  Service: General;  Laterality: N/A;  . EYE SURGERY Bilateral    cataracts removed-   . HEMORRHOID SURGERY    . KNEE ARTHROSCOPY    . LAPAROSCOPIC NISSEN FUNDOPLICATION N/A 10/24/2456   Procedure: LAPAROSCOPIC NISSEN FUNDOPLICATION;  Surgeon: [name], MD;  [facility];  Service: General;  Laterality: N/A;  . MENISCECTOMY   July 2002    preoperative diagnosis torn medial meniscus right knee, partial medial meniscectomy, debridement  chondroplasty patellofemoral joint, done by Dr. Madelon Lipsaffrey  . POLYPECTOMY  2000   Dr.Magod  . RIGHT/LEFT HEART CATH AND CORONARY ANGIOGRAPHY N/A 04/06/2018   Procedure: RIGHT/LEFT HEART CATH AND CORONARY ANGIOGRAPHY;  Surgeon: Kathleene HazelMcAlhany, Christopher D, MD;  [facility]: [facility];  Service: Cardiovascular;  Laterality: N/A;  . ROTATOR CUFF REPAIR  09/21/2011   rt shoulder  . TEE WITHOUT CARDIOVERSION N/A 05/30/2018   Procedure: TRANSESOPHAGEAL ECHOCARDIOGRAM (TEE);  Surgeon: Kathleene HazelMcAlhany, Christopher D, MD;  [facility];  Service: Open Heart Surgery;  Laterality: N/A;  . TOOTH EXTRACTION Bilateral 04/05/2018   Procedure: Extraction of tooth #'s 6 and 29 with alveoloplasty and gross debridement of remaining teeth;  Surgeon: Charlynne PanderKulinski, Ronald F, DDS;  Location: [facility] OR;  Service: Oral Surgery;  Laterality: Bilateral;  . TRANSCATHETER AORTIC VALVE REPLACEMENT, TRANSFEMORAL N/A 05/30/2018   Procedure: TRANSCATHETER AORTIC VALVE REPLACEMENT, TRANSFEMORAL - MEDTRONIC VALVE;   Surgeon: Kathleene HazelMcAlhany, Christopher D, MD;  Location: [facility];  Service: Open Heart Surgery;  Laterality: N/A;    MEDICATIONS: . amLODipine (NORVASC) 10 MG tablet  . amoxicillin (AMOXIL) 500 MG tablet  . buPROPion (WELLBUTRIN XL) 150 MG 24 hr tablet  . chlorthalidone (HYGROTON) 25 MG tablet  . clopidogrel (PLAVIX) 75 MG tablet  . cyclobenzaprine (FLEXERIL) 10 MG tablet  . DEXILANT 60 MG capsule  . famotidine (PEPCID) 20 MG tablet  . gabapentin (NEURONTIN) 100 MG capsule  . PROAIR HFA 108 (90 Base) MCG/ACT inhaler  . sucralfate (CARAFATE) 1 GM/10ML suspension  . SUMAtriptan (IMITREX) 50 MG tablet   No current facility-administered medications for this encounter.      Janey GentaJessica , PA-C [facility] [phone] 11/30/18  12:01 PM

## 2018-11-30 NOTE — Anesthesia Preprocedure Evaluation (Addendum)
Anesthesia Evaluation  Patient identified by MRN, date of birth, ID band Patient awake    Reviewed: Allergy & Precautions, NPO status , Patient's Chart, lab work & pertinent test results  Airway Mallampati: II  TM Distance: >3 FB Neck ROM: Full    Dental no notable dental hx. (+) Partial Upper,    Pulmonary neg pulmonary ROS, former smoker,    Pulmonary exam normal breath sounds clear to auscultation       Cardiovascular Exercise Tolerance: Good hypertension, Pt. on medications Normal cardiovascular exam Rhythm:Regular Rate:Normal  06/29/18 ECHO 1. The left ventricle has normal systolic function with an ejection fraction of 60-65%. The cavity size was normal. Left ventricular diastolic Doppler parameters are consistent with pseudonormalization No evidence of left ventricular regional wall  motion abnormalities.   Neuro/Psych  Headaches, negative psych ROS   GI/Hepatic GERD  ,  Endo/Other  negative endocrine ROS  Renal/GU negative Renal ROS     Musculoskeletal   Abdominal   Peds  Hematology  (+) anemia ,   Anesthesia Other Findings   Reproductive/Obstetrics                          Anesthesia Physical Anesthesia Plan  ASA: II  Anesthesia Plan: General   Post-op Pain Management:    Induction: Intravenous  PONV Risk Score and Plan: 3 and Treatment may vary due to age or medical condition and Ondansetron  Airway Management Planned: Oral ETT  Additional Equipment:   Intra-op Plan:   Post-operative Plan: Extubation in OR  Informed Consent: I have reviewed the patients History and Physical, chart, labs and discussed the procedure including the risks, benefits and alternatives for the proposed anesthesia with the patient or authorized representative who has indicated his/her understanding and acceptance.     Dental advisory given  Plan Discussed with:   Anesthesia Plan Comments:  (See PAT note 11/29/2018, Konrad Felix, PA-C)      Anesthesia Quick Evaluation

## 2018-12-01 ENCOUNTER — Inpatient Hospital Stay (HOSPITAL_COMMUNITY)
Admission: RE | Admit: 2018-12-01 | Discharge: 2018-12-10 | DRG: 326 | Disposition: A | Discharge status: Home Health | Payer: Medicare Other | Attending: Surgery | Admitting: Surgery

## 2018-12-01 ENCOUNTER — Inpatient Hospital Stay (HOSPITAL_COMMUNITY): Payer: Medicare Other | Admitting: Physician Assistant

## 2018-12-01 ENCOUNTER — Encounter (HOSPITAL_COMMUNITY)
Admission: RE | Disposition: A | Discharge status: Home / Self Care | Payer: Self-pay | Source: Home / Self Care | Attending: Surgery

## 2018-12-01 ENCOUNTER — Inpatient Hospital Stay (HOSPITAL_COMMUNITY): Payer: Medicare Other | Admitting: Anesthesiology

## 2018-12-01 ENCOUNTER — Encounter (HOSPITAL_COMMUNITY): Payer: Self-pay | Admitting: *Deleted

## 2018-12-01 ENCOUNTER — Other Ambulatory Visit: Payer: Self-pay

## 2018-12-01 DIAGNOSIS — I10 Essential (primary) hypertension: Secondary | ICD-10-CM | POA: Diagnosis present

## 2018-12-01 DIAGNOSIS — K9189 Other postprocedural complications and disorders of digestive system: Secondary | ICD-10-CM | POA: Diagnosis present

## 2018-12-01 DIAGNOSIS — Z9889 Other specified postprocedural states: Secondary | ICD-10-CM | POA: Diagnosis present

## 2018-12-01 DIAGNOSIS — F419 Anxiety disorder, unspecified: Secondary | ICD-10-CM | POA: Diagnosis present

## 2018-12-01 DIAGNOSIS — D62 Acute posthemorrhagic anemia: Secondary | ICD-10-CM | POA: Diagnosis present

## 2018-12-01 DIAGNOSIS — Z87891 Personal history of nicotine dependence: Secondary | ICD-10-CM | POA: Diagnosis not present

## 2018-12-01 DIAGNOSIS — Z8249 Family history of ischemic heart disease and other diseases of the circulatory system: Secondary | ICD-10-CM | POA: Diagnosis not present

## 2018-12-01 DIAGNOSIS — Z79899 Other long term (current) drug therapy: Secondary | ICD-10-CM

## 2018-12-01 DIAGNOSIS — Z86718 Personal history of other venous thrombosis and embolism: Secondary | ICD-10-CM | POA: Diagnosis not present

## 2018-12-01 DIAGNOSIS — J690 Pneumonitis due to inhalation of food and vomit: Secondary | ICD-10-CM | POA: Diagnosis not present

## 2018-12-01 DIAGNOSIS — D61818 Other pancytopenia: Secondary | ICD-10-CM | POA: Diagnosis present

## 2018-12-01 DIAGNOSIS — J449 Chronic obstructive pulmonary disease, unspecified: Secondary | ICD-10-CM | POA: Diagnosis present

## 2018-12-01 DIAGNOSIS — I350 Nonrheumatic aortic (valve) stenosis: Secondary | ICD-10-CM | POA: Diagnosis present

## 2018-12-01 DIAGNOSIS — R1310 Dysphagia, unspecified: Secondary | ICD-10-CM

## 2018-12-01 DIAGNOSIS — Z9119 Patient's noncompliance with other medical treatment and regimen: Secondary | ICD-10-CM | POA: Diagnosis not present

## 2018-12-01 DIAGNOSIS — R270 Ataxia, unspecified: Secondary | ICD-10-CM

## 2018-12-01 DIAGNOSIS — R440 Auditory hallucinations: Secondary | ICD-10-CM | POA: Diagnosis present

## 2018-12-01 DIAGNOSIS — D649 Anemia, unspecified: Secondary | ICD-10-CM | POA: Diagnosis present

## 2018-12-01 DIAGNOSIS — F329 Major depressive disorder, single episode, unspecified: Secondary | ICD-10-CM | POA: Diagnosis present

## 2018-12-01 DIAGNOSIS — E785 Hyperlipidemia, unspecified: Secondary | ICD-10-CM | POA: Diagnosis present

## 2018-12-01 DIAGNOSIS — K5900 Constipation, unspecified: Secondary | ICD-10-CM | POA: Diagnosis not present

## 2018-12-01 DIAGNOSIS — F32A Depression, unspecified: Secondary | ICD-10-CM | POA: Diagnosis present

## 2018-12-01 DIAGNOSIS — R490 Dysphonia: Secondary | ICD-10-CM | POA: Diagnosis present

## 2018-12-01 DIAGNOSIS — K219 Gastro-esophageal reflux disease without esophagitis: Secondary | ICD-10-CM | POA: Diagnosis present

## 2018-12-01 DIAGNOSIS — K2270 Barrett's esophagus without dysplasia: Secondary | ICD-10-CM | POA: Diagnosis present

## 2018-12-01 DIAGNOSIS — K660 Peritoneal adhesions (postprocedural) (postinfection): Secondary | ICD-10-CM | POA: Diagnosis present

## 2018-12-01 DIAGNOSIS — Z7902 Long term (current) use of antithrombotics/antiplatelets: Secondary | ICD-10-CM

## 2018-12-01 DIAGNOSIS — J42 Unspecified chronic bronchitis: Secondary | ICD-10-CM | POA: Diagnosis present

## 2018-12-01 DIAGNOSIS — I700 Atherosclerosis of aorta: Secondary | ICD-10-CM | POA: Diagnosis present

## 2018-12-01 DIAGNOSIS — K449 Diaphragmatic hernia without obstruction or gangrene: Secondary | ICD-10-CM | POA: Diagnosis present

## 2018-12-01 DIAGNOSIS — R509 Fever, unspecified: Secondary | ICD-10-CM

## 2018-12-01 DIAGNOSIS — F5104 Psychophysiologic insomnia: Secondary | ICD-10-CM | POA: Diagnosis present

## 2018-12-01 DIAGNOSIS — M810 Age-related osteoporosis without current pathological fracture: Secondary | ICD-10-CM | POA: Diagnosis present

## 2018-12-01 DIAGNOSIS — Z09 Encounter for follow-up examination after completed treatment for conditions other than malignant neoplasm: Secondary | ICD-10-CM

## 2018-12-01 DIAGNOSIS — D500 Iron deficiency anemia secondary to blood loss (chronic): Secondary | ICD-10-CM | POA: Diagnosis present

## 2018-12-01 HISTORY — PX: HIATAL HERNIA REPAIR: SHX195

## 2018-12-01 LAB — CBC
HCT: 31.7 % — ABNORMAL LOW (ref 36.0–46.0)
Hemoglobin: 9.9 g/dL — ABNORMAL LOW (ref 12.0–15.0)
MCH: 29 pg (ref 26.0–34.0)
MCHC: 31.2 g/dL (ref 30.0–36.0)
MCV: 93 fL (ref 80.0–100.0)
Platelets: 136 10*3/uL — ABNORMAL LOW (ref 150–400)
RBC: 3.41 MIL/uL — ABNORMAL LOW (ref 3.87–5.11)
RDW: 13 % (ref 11.5–15.5)
WBC: 10.9 10*3/uL — ABNORMAL HIGH (ref 4.0–10.5)
nRBC: 0 % (ref 0.0–0.2)

## 2018-12-01 LAB — CREATININE, SERUM
Creatinine, Ser: 0.8 mg/dL (ref 0.44–1.00)
GFR calc Af Amer: >60 mL/min (ref >60)
GFR calc non Af Amer: >60 mL/min (ref >60)

## 2018-12-01 SURGERY — REPAIR, HERNIA, HIATAL
Anesthesia: General

## 2018-12-01 MED ORDER — SODIUM CHLORIDE (PF) 0.9 % IJ SOLN
INTRAMUSCULAR | Status: DC | PRN
  Administered 2018-12-01: 10 mL

## 2018-12-01 MED ORDER — LIDOCAINE 2% (20 MG/ML) 5 ML SYRINGE
INTRAMUSCULAR | Status: DC | PRN
  Administered 2018-12-01: 80 mg via INTRAVENOUS

## 2018-12-01 MED ORDER — ROCURONIUM BROMIDE 50 MG/5ML IV SOSY
PREFILLED_SYRINGE | INTRAVENOUS | Status: DC | PRN
  Administered 2018-12-01: 50 mg via INTRAVENOUS
  Administered 2018-12-01: 30 mg via INTRAVENOUS

## 2018-12-01 MED ORDER — ONDANSETRON 4 MG PO TBDP
4.0000 mg | ORAL_TABLET | Freq: Four times a day (QID) | ORAL | Status: DC | PRN
  Administered 2018-12-08: 4 mg via ORAL
  Filled 2018-12-01: qty 1

## 2018-12-01 MED ORDER — PROPOFOL 10 MG/ML IV BOLUS
INTRAVENOUS | Status: DC | PRN
  Administered 2018-12-01: 70 mg via INTRAVENOUS

## 2018-12-01 MED ORDER — CEFAZOLIN SODIUM-DEXTROSE 2-4 GM/100ML-% IV SOLN
2.0000 g | Freq: Three times a day (TID) | INTRAVENOUS | Status: AC
  Administered 2018-12-01: 2 g via INTRAVENOUS
  Filled 2018-12-01: qty 100

## 2018-12-01 MED ORDER — HYDRALAZINE HCL 20 MG/ML IJ SOLN: 10.0000 mg | INTRAMUSCULAR | Status: DC | PRN

## 2018-12-01 MED ORDER — KCL IN DEXTROSE-NACL 20-5-0.45 MEQ/L-%-% IV SOLN
INTRAVENOUS | Status: DC
  Administered 2018-12-01 – 2018-12-07 (×8): via INTRAVENOUS
  Filled 2018-12-01 (×11): qty 1000

## 2018-12-01 MED ORDER — ONDANSETRON HCL 4 MG/2ML IJ SOLN: 4.0000 mg | Freq: Once | INTRAMUSCULAR | Status: DC | PRN

## 2018-12-01 MED ORDER — CEFAZOLIN SODIUM-DEXTROSE 2-4 GM/100ML-% IV SOLN
2.0000 g | INTRAVENOUS | Status: AC
  Administered 2018-12-01: 10:00:00 2 g via INTRAVENOUS
  Filled 2018-12-01: qty 100

## 2018-12-01 MED ORDER — FENTANYL CITRATE (PF) 100 MCG/2ML IJ SOLN
INTRAMUSCULAR | Status: AC
  Filled 2018-12-01: qty 2

## 2018-12-01 MED ORDER — CHLORHEXIDINE GLUCONATE CLOTH 2 % EX PADS: 6.0000 | MEDICATED_PAD | Freq: Once | CUTANEOUS | Status: DC

## 2018-12-01 MED ORDER — BUPIVACAINE LIPOSOME 1.3 % IJ SUSP
20.0000 mL | Freq: Once | INTRAMUSCULAR | Status: AC
  Administered 2018-12-01: 20 mL
  Filled 2018-12-01: qty 20

## 2018-12-01 MED ORDER — FENTANYL CITRATE (PF) 100 MCG/2ML IJ SOLN
12.5000 ug | INTRAMUSCULAR | Status: DC | PRN
  Administered 2018-12-01 – 2018-12-08 (×12): 12.5 ug via INTRAVENOUS
  Filled 2018-12-01 (×12): qty 2

## 2018-12-01 MED ORDER — SODIUM CHLORIDE (PF) 0.9 % IJ SOLN
INTRAMUSCULAR | Status: AC
  Filled 2018-12-01: qty 10

## 2018-12-01 MED ORDER — PROPOFOL 10 MG/ML IV BOLUS
INTRAVENOUS | Status: AC
  Filled 2018-12-01: qty 20

## 2018-12-01 MED ORDER — HYDROCODONE-ACETAMINOPHEN 5-325 MG PO TABS
1.0000 | ORAL_TABLET | ORAL | Status: DC | PRN
  Administered 2018-12-01: 2 via ORAL
  Administered 2018-12-02 (×2): 1 via ORAL
  Administered 2018-12-02: 2 via ORAL
  Filled 2018-12-01: qty 1
  Filled 2018-12-01 (×2): qty 2
  Filled 2018-12-01 (×2): qty 1

## 2018-12-01 MED ORDER — DEXAMETHASONE SODIUM PHOSPHATE 4 MG/ML IJ SOLN
INTRAMUSCULAR | Status: DC | PRN
  Administered 2018-12-01: 10 mg via INTRAVENOUS

## 2018-12-01 MED ORDER — SODIUM CHLORIDE 0.9 % IV SOLN
INTRAVENOUS | Status: DC | PRN
  Administered 2018-12-01: 10:00:00 50 ug/min via INTRAVENOUS

## 2018-12-01 MED ORDER — HEPARIN SODIUM (PORCINE) 5000 UNIT/ML IJ SOLN
5000.0000 [IU] | Freq: Once | INTRAMUSCULAR | Status: AC
  Administered 2018-12-01: 5000 [IU] via SUBCUTANEOUS
  Filled 2018-12-01: qty 1

## 2018-12-01 MED ORDER — PHENYLEPHRINE 40 MCG/ML (10ML) SYRINGE FOR IV PUSH (FOR BLOOD PRESSURE SUPPORT)
PREFILLED_SYRINGE | INTRAVENOUS | Status: DC | PRN
  Administered 2018-12-01: 40 ug via INTRAVENOUS
  Administered 2018-12-01 (×2): 80 ug via INTRAVENOUS
  Administered 2018-12-01: 40 ug via INTRAVENOUS
  Administered 2018-12-01: 80 ug via INTRAVENOUS

## 2018-12-01 MED ORDER — SUGAMMADEX SODIUM 500 MG/5ML IV SOLN
INTRAVENOUS | Status: DC | PRN
  Administered 2018-12-01: 200 mg via INTRAVENOUS

## 2018-12-01 MED ORDER — ALBUTEROL SULFATE (2.5 MG/3ML) 0.083% IN NEBU
3.0000 mL | INHALATION_SOLUTION | RESPIRATORY_TRACT | Status: DC | PRN
  Administered 2018-12-02: 3 mL via RESPIRATORY_TRACT
  Filled 2018-12-01: qty 3

## 2018-12-01 MED ORDER — FENTANYL CITRATE (PF) 250 MCG/5ML IJ SOLN
INTRAMUSCULAR | Status: AC
  Filled 2018-12-01: qty 5

## 2018-12-01 MED ORDER — LACTATED RINGERS IV SOLN
INTRAVENOUS | Status: DC
  Administered 2018-12-01: 08:00:00 via INTRAVENOUS

## 2018-12-01 MED ORDER — PANTOPRAZOLE SODIUM 40 MG IV SOLR
40.0000 mg | Freq: Every day | INTRAVENOUS | Status: DC
  Administered 2018-12-01: 40 mg via INTRAVENOUS
  Filled 2018-12-01: qty 40

## 2018-12-01 MED ORDER — HEPARIN SODIUM (PORCINE) 5000 UNIT/ML IJ SOLN
5000.0000 [IU] | Freq: Three times a day (TID) | INTRAMUSCULAR | Status: DC
  Administered 2018-12-02 – 2018-12-06 (×14): 5000 [IU] via SUBCUTANEOUS
  Filled 2018-12-01 (×18): qty 1

## 2018-12-01 MED ORDER — ACETAMINOPHEN 10 MG/ML IV SOLN
INTRAVENOUS | Status: AC
  Filled 2018-12-01: qty 100

## 2018-12-01 MED ORDER — ACETAMINOPHEN 10 MG/ML IV SOLN
1000.0000 mg | Freq: Once | INTRAVENOUS | Status: DC | PRN
  Administered 2018-12-01: 750 mg via INTRAVENOUS

## 2018-12-01 MED ORDER — EPHEDRINE SULFATE 50 MG/ML IJ SOLN
INTRAMUSCULAR | Status: DC | PRN
  Administered 2018-12-01: 10 mg via INTRAVENOUS
  Administered 2018-12-01 (×3): 5 mg via INTRAVENOUS
  Administered 2018-12-01: 10 mg via INTRAVENOUS
  Administered 2018-12-01: 5 mg via INTRAVENOUS
  Administered 2018-12-01: 10 mg via INTRAVENOUS

## 2018-12-01 MED ORDER — FENTANYL CITRATE (PF) 100 MCG/2ML IJ SOLN
INTRAMUSCULAR | Status: DC | PRN
  Administered 2018-12-01 (×3): 50 ug via INTRAVENOUS
  Administered 2018-12-01 (×2): 25 ug via INTRAVENOUS

## 2018-12-01 MED ORDER — ONDANSETRON HCL 4 MG/2ML IJ SOLN
INTRAMUSCULAR | Status: DC | PRN
  Administered 2018-12-01: 4 mg via INTRAVENOUS

## 2018-12-01 MED ORDER — ONDANSETRON HCL 4 MG/2ML IJ SOLN
4.0000 mg | Freq: Four times a day (QID) | INTRAMUSCULAR | Status: DC | PRN
  Administered 2018-12-05 – 2018-12-09 (×3): 4 mg via INTRAVENOUS
  Filled 2018-12-01 (×3): qty 2

## 2018-12-01 MED ORDER — FENTANYL CITRATE (PF) 100 MCG/2ML IJ SOLN
25.0000 ug | INTRAMUSCULAR | Status: DC | PRN
  Administered 2018-12-01: 25 ug via INTRAVENOUS
  Administered 2018-12-01: 50 ug via INTRAVENOUS

## 2018-12-01 MED ORDER — SUCCINYLCHOLINE CHLORIDE 200 MG/10ML IV SOSY
PREFILLED_SYRINGE | INTRAVENOUS | Status: DC | PRN
  Administered 2018-12-01: 30 mg via INTRAVENOUS

## 2018-12-01 SURGICAL SUPPLY — 57 items
ADH SKN CLS APL DERMABOND .7 (GAUZE/BANDAGES/DRESSINGS) ×1
APPLIER CLIP ROT 10 11.4 M/L (STAPLE)
APR CLP MED LRG 11.4X10 (STAPLE)
CABLE HIGH FREQUENCY MONO STRZ (ELECTRODE) IMPLANT
CLIP APPLIE ROT 10 11.4 M/L (STAPLE) IMPLANT
COVER SURGICAL LIGHT HANDLE (MISCELLANEOUS) ×3 IMPLANT
COVER WAND RF STERILE (DRAPES) IMPLANT
DECANTER SPIKE VIAL GLASS SM (MISCELLANEOUS) ×3 IMPLANT
DERMABOND ADVANCED (GAUZE/BANDAGES/DRESSINGS) ×2
DERMABOND ADVANCED .7 DNX12 (GAUZE/BANDAGES/DRESSINGS) IMPLANT
DEVICE SUT QUICK LOAD TK 5 (STAPLE) ×5 IMPLANT
DEVICE SUT TI-KNOT TK 5X26 (MISCELLANEOUS) ×2 IMPLANT
DEVICE SUTURE ENDOST 10MM (ENDOMECHANICALS) ×3 IMPLANT
DEVICE TI KNOT TK5 (MISCELLANEOUS) ×1
DISSECTOR BLUNT TIP ENDO 5MM (MISCELLANEOUS) ×3 IMPLANT
DRAIN PENROSE 18X1/2 LTX STRL (DRAIN) ×3 IMPLANT
DRAIN PENROSE 18X1/4 LTX STRL (WOUND CARE) ×2 IMPLANT
DRAPE LAPAROSCOPIC ABDOMINAL (DRAPES) ×3 IMPLANT
ELECT L-HOOK LAP 45CM DISP (ELECTROSURGICAL) ×3
ELECT PENCIL ROCKER SW 15FT (MISCELLANEOUS) ×3 IMPLANT
ELECT REM PT RETURN 15FT ADLT (MISCELLANEOUS) ×3 IMPLANT
ELECTRODE L-HOOK LAP 45CM DISP (ELECTROSURGICAL) ×1 IMPLANT
GLOVE BIOGEL M 8.0 STRL (GLOVE) ×3 IMPLANT
GOWN STRL REUS W/TWL XL LVL3 (GOWN DISPOSABLE) ×9 IMPLANT
KIT BASIN OR (CUSTOM PROCEDURE TRAY) ×3 IMPLANT
KIT TURNOVER KIT A (KITS) IMPLANT
PAD POSITIONING PINK XL (MISCELLANEOUS) IMPLANT
PROTECTOR NERVE ULNAR (MISCELLANEOUS) IMPLANT
QUICK LOAD TK 5 (STAPLE)
RELOAD ENDO STITCH (ENDOMECHANICALS) ×6 IMPLANT
RELOAD ENDO STITCH 2.0 (ENDOMECHANICALS) ×6
RELOAD SUT SNGL STCH ABSRB 2-0 (ENDOMECHANICALS) IMPLANT
RELOAD SUT TRIPLE-STITCH 2-0 (ENDOMECHANICALS) ×3 IMPLANT
SCISSORS LAP 5X45 EPIX DISP (ENDOMECHANICALS) ×3 IMPLANT
SET IRRIG TUBING LAPAROSCOPIC (IRRIGATION / IRRIGATOR) ×3 IMPLANT
SET TUBE SMOKE EVAC HIGH FLOW (TUBING) ×3 IMPLANT
SHEARS HARMONIC ACE PLUS 45CM (MISCELLANEOUS) ×3 IMPLANT
SLEEVE ADV FIXATION 5X100MM (TROCAR) ×6 IMPLANT
STAPLER VISISTAT 35W (STAPLE) ×3 IMPLANT
SUT MNCRL AB 4-0 PS2 18 (SUTURE) IMPLANT
SUT RELOAD ENDO STITCH 2 48X1 (ENDOMECHANICALS) ×2
SUT SURGIDAC NAB ES-9 0 48 120 (SUTURE) ×9 IMPLANT
SUT VIC AB 4-0 SH 18 (SUTURE) ×2 IMPLANT
SUTURE RELOAD END STTCH 2 48X1 (ENDOMECHANICALS) ×2 IMPLANT
TAPE CLOTH 4X10 WHT NS (GAUZE/BANDAGES/DRESSINGS) IMPLANT
TIP INNERVISION DETACH 40FR (MISCELLANEOUS) IMPLANT
TIP INNERVISION DETACH 50FR (MISCELLANEOUS) IMPLANT
TIP INNERVISION DETACH 56FR (MISCELLANEOUS) IMPLANT
TIPS INNERVISION DETACH 40FR (MISCELLANEOUS)
TOWEL OR 17X26 10 PK STRL BLUE (TOWEL DISPOSABLE) ×6 IMPLANT
TOWEL OR NON WOVEN STRL DISP B (DISPOSABLE) ×3 IMPLANT
TRAY FOLEY MTR SLVR 14FR STAT (SET/KITS/TRAYS/PACK) ×2 IMPLANT
TRAY FOLEY MTR SLVR 16FR STAT (SET/KITS/TRAYS/PACK) IMPLANT
TRAY LAPAROSCOPIC (CUSTOM PROCEDURE TRAY) ×3 IMPLANT
TROCAR ADV FIXATION 11X100MM (TROCAR) ×3 IMPLANT
TROCAR ADV FIXATION 5X100MM (TROCAR) ×3 IMPLANT
TROCAR BLADELESS OPT 5 100 (ENDOMECHANICALS) ×3 IMPLANT

## 2018-12-01 NOTE — Op Note (Signed)
Sherri Mcmillan  June 12, 1932 01 December 2018    PCP:  Ina Homes, MD   Surgeon: Kaylyn Lim, MD, FACS  Asst:  Romana Juniper, MD, FACS   Anes:  general  Preop Dx: Dysphagia after Nissen 2015; hiatal hernia Postop Dx: same  Procedure: Laparoscopic takedown hiatal hernia with reduction of Nissen to 270 degree; endoscopy Location Surgery: [facility] Complications: None   EBL:   20 cc  Drains: none  Description of Procedure:  The patient was taken to OR 1 .  After anesthesia was administered and the patient was prepped  with Technicare and a timeout was performed.  Access achieved with 5 mm Optiview through the left upper quadrant.  Patient has lower midline adhesions involving her colon.  I placed 6 trochars eventually up graded 1 to the right of the midline to a 1112 for the Endo Stitch.  The liver retractor was placed in section foregut and seated with scissors and later with harmonic scalpel.  I was able to bring down the rapid stomach which had gone up into the chest.  The Nissen wrap was intact.  I took it down by dividing the sutures holding the stomach to the stomach and was able to allow that to relax to a 270.  We then endoscope the patient to verify the anatomy.  Her esophagus was a little short.  There was no way that I could close the hiatus again.  The sutures were intact posteriorly.  Because of her age elected to control some bleeding along the edge of the abdominal wraps and leave the stomach unwrapped below the diaphragm.  Fig of 8 of Endostitch vicryl 2-0 was used to control the bleeding at the edge.    A TAP block with 30 cc Exparel had been placed.  Abdomen decompressed.  Incisions closed with 4-0 vicryl and Dermabond.    The patient tolerated the procedure well and was taken to the PACU in stable condition.     Matt B. Hassell Done, [place], [facility], [place]

## 2018-12-01 NOTE — Anesthesia Postprocedure Evaluation (Signed)
Anesthesia Post Note  Patient: Sherri Mcmillan  Procedure(s) Performed: LAPAROSCOPIC TAKEDOWN HIATAL HERNIA NISEEN REDUCTION AND UPPER ENDOSCOPY (N/A )     Patient location during evaluation: PACU Anesthesia Type: General Level of consciousness: awake and alert Pain management: pain level controlled Vital Signs Assessment: post-procedure vital signs reviewed and stable Respiratory status: spontaneous breathing, nonlabored ventilation, respiratory function stable and patient connected to nasal cannula oxygen Cardiovascular status: blood pressure returned to baseline and stable Postop Assessment: no apparent nausea or vomiting Anesthetic complications: no    Last Vitals:  Vitals:   12/01/18 1426 12/01/18 1557  BP: (!) 101/57 (!) 87/55  Pulse: 77 (!) 43  Resp: 12 19  Temp:    SpO2: 97% 92%    Last Pain:  Vitals:   12/01/18 1500  TempSrc:   PainSc: 10-Worst pain ever                 Barnet Glasgow

## 2018-12-01 NOTE — Op Note (Signed)
Preoperative diagnosis: nissen takedown  Postoperative diagnosis: Same   Procedure: Upper endoscopy   Surgeon: Clovis Riley, M.D.  Anesthesia: Gen.   Description of procedure: The endoscopy was placed in the mouth and into the oropharynx and under endoscopic vision it was advanced to the esophagogastric junction and into the stomach. There was no evidence of injury to the esophagus or stomach. There is no obstruction and the scope is easily able to traverse the GE junction and the hiatus. The lumen was decompressed and the scope was withdrawn without difficulty.    Clovis Riley, M.D. General, Bariatric, & Minimally Invasive Surgery [facility]

## 2018-12-01 NOTE — Transfer of Care (Signed)
Immediate Anesthesia Transfer of Care Note  Patient: Sherri Mcmillan  Procedure(s) Performed: Procedure(s): LAPAROSCOPIC TAKEDOWN HIATAL HERNIA NISEEN REDUCTION AND UPPER ENDOSCOPY (N/A)  Patient Location: PACU  Anesthesia Type:General  Level of Consciousness: Patient easily awoken, sedated, comfortable, cooperative, following commands, responds to stimulation.   Airway & Oxygen Therapy: Patient spontaneously breathing, ventilating well, oxygen via simple oxygen mask.  Post-op Assessment: Report given to PACU RN, vital signs reviewed and stable, moving all extremities.   Post vital signs: Reviewed and stable.  Complications: No apparent anesthesia complications  Last Vitals:  Vitals Value Taken Time  BP 173/89 12/01/18 1207  Temp    Pulse 66 12/01/18 1210  Resp 13 12/01/18 1210  SpO2 100 % 12/01/18 1210  Vitals shown include unvalidated device data.  Last Pain:  Vitals:   12/01/18 0743  TempSrc:   PainSc: 7       Patients Stated Pain Goal: 4 (16/10/96 0454)  Complications: No apparent anesthesia complications

## 2018-12-01 NOTE — Anesthesia Procedure Notes (Signed)
Procedure Name: Intubation Date/Time: 12/01/2018 9:53 AM Performed by: Deliah Boston, CRNA Pre-anesthesia Checklist: Patient identified, Emergency Drugs available, Suction available and Patient being monitored Patient Re-evaluated:Patient Re-evaluated prior to induction Oxygen Delivery Method: Circle system utilized Preoxygenation: Pre-oxygenation with 100% oxygen Induction Type: IV induction Ventilation: Mask ventilation without difficulty Tube type: Oral Number of attempts: 2 Airway Equipment and Method: Stylet and Oral airway Placement Confirmation: ETT inserted through vocal cords under direct vision,  positive ETCO2 and breath sounds checked- equal and bilateral Secured at: 22 cm Tube secured with: Tape Dental Injury: Teeth and Oropharynx as per pre-operative assessment

## 2018-12-01 NOTE — Interval H&P Note (Signed)
History and Physical Interval Note:  12/01/2018 9:25 AM  Sherri Mcmillan  has presented today for surgery, with the diagnosis of RECURRENT HIATAL HERNIA.  The various methods of treatment have been discussed with the patient and family. After consideration of risks, benefits and other options for treatment, the patient has consented to  Procedure(s): REDO HIATAL HERNIA REPAIR TAKEDOWN NISEEN (N/A) as a surgical intervention.  The patient's history has been reviewed, patient examined, no change in status, stable for surgery.  I have reviewed the patient's chart and labs.  Questions were answered to the patient's satisfaction.     [name]

## 2018-12-02 ENCOUNTER — Encounter (HOSPITAL_COMMUNITY): Payer: Self-pay | Admitting: Surgery

## 2018-12-02 LAB — BASIC METABOLIC PANEL
Anion gap: 8 (ref 5–15)
BUN: 20 mg/dL (ref 8–23)
CO2: 23 mmol/L (ref 22–32)
Calcium: 8.4 mg/dL — ABNORMAL LOW (ref 8.9–10.3)
Chloride: 104 mmol/L (ref 98–111)
Creatinine, Ser: 0.95 mg/dL (ref 0.44–1.00)
GFR calc Af Amer: >60 mL/min (ref >60)
GFR calc non Af Amer: 55 mL/min — ABNORMAL LOW (ref >60)
Glucose, Bld: 199 mg/dL — ABNORMAL HIGH (ref 70–99)
Potassium: 4.5 mmol/L (ref 3.5–5.1)
Sodium: 135 mmol/L (ref 135–145)

## 2018-12-02 LAB — CBC
HCT: 24.8 % — ABNORMAL LOW (ref 36.0–46.0)
Hemoglobin: 7.8 g/dL — ABNORMAL LOW (ref 12.0–15.0)
MCH: 29 pg (ref 26.0–34.0)
MCHC: 31.5 g/dL (ref 30.0–36.0)
MCV: 92.2 fL (ref 80.0–100.0)
Platelets: 122 10*3/uL — ABNORMAL LOW (ref 150–400)
RBC: 2.69 MIL/uL — ABNORMAL LOW (ref 3.87–5.11)
RDW: 13.1 % (ref 11.5–15.5)
WBC: 8.4 10*3/uL (ref 4.0–10.5)
nRBC: 0 % (ref 0.0–0.2)

## 2018-12-02 MED ORDER — PANTOPRAZOLE SODIUM 40 MG IV SOLR
40.0000 mg | Freq: Two times a day (BID) | INTRAVENOUS | Status: DC
  Administered 2018-12-02 – 2018-12-05 (×6): 40 mg via INTRAVENOUS
  Filled 2018-12-02 (×6): qty 40

## 2018-12-02 MED ORDER — OXYCODONE HCL 5 MG PO TABS
5.0000 mg | ORAL_TABLET | ORAL | Status: DC | PRN
  Administered 2018-12-02 – 2018-12-07 (×13): 10 mg via ORAL
  Administered 2018-12-08: 5 mg via ORAL
  Administered 2018-12-08 – 2018-12-09 (×3): 10 mg via ORAL
  Filled 2018-12-02 (×10): qty 2
  Filled 2018-12-02: qty 1
  Filled 2018-12-02 (×7): qty 2

## 2018-12-02 MED ORDER — PHENOL 1.4 % MT LIQD
1.0000 | OROMUCOSAL | Status: DC | PRN
  Administered 2018-12-03: 12:00:00 1 via OROMUCOSAL
  Filled 2018-12-02: qty 177

## 2018-12-02 MED ORDER — ALUM & MAG HYDROXIDE-SIMETH 200-200-20 MG/5ML PO SUSP
30.0000 mL | ORAL | Status: DC | PRN
  Administered 2018-12-02: 30 mL via ORAL
  Filled 2018-12-02: qty 30

## 2018-12-02 MED ORDER — MAGIC MOUTHWASH W/LIDOCAINE
5.0000 mL | Freq: Three times a day (TID) | ORAL | Status: DC | PRN
  Administered 2018-12-03 – 2018-12-04 (×2): 5 mL via ORAL
  Filled 2018-12-02 (×4): qty 5

## 2018-12-02 NOTE — Plan of Care (Signed)
Patient lying in bed this morning; complains of pain in epigastric region and requests pain medication. No other needs expressed. Will continue to monitor.

## 2018-12-02 NOTE — Progress Notes (Signed)
1 Day Post-Op lap Nissen takedown Subjective: Complains of a burning pain when she swallows, having regurgitation of liquids.  Vicodin not working  Objective: Vital signs in last 24 hours: Temp:  [97.6 F (36.4 C)-99.1 F (37.3 C)] 99.1 F (37.3 C) (08/08 0627) Pulse Rate:  [43-96] 89 (08/08 0627) Resp:  [10-19] 18 (08/08 0627) BP: (87-173)/(55-89) 106/59 (08/08 0627) SpO2:  [92 %-100 %] 98 % (08/08 0627)   Intake/Output from previous day: 08/07 0701 - 08/08 0700 In: 2880 [P.O.:480; I.V.:2300; IV Piggyback:100] Out: 1275 [Urine:1260; Blood:15] Intake/Output this shift: Total I/O In: 240 [P.O.:240] Out: -    General appearance: alert and cooperative GI: normal findings: soft, non-tender  Incision: no significant drainage  Lab Results:  Recent Labs    12/01/18 1436 12/02/18 0313  WBC 10.9* 8.4  HGB 9.9* 7.8*  HCT 31.7* 24.8*  PLT 136* 122*   BMET Recent Labs    11/29/18 1355 12/01/18 1436 12/02/18 0313  NA 141  --  135  K 4.4  --  4.5  CL 104  --  104  CO2 31  --  23  GLUCOSE 89  --  199*  BUN 18  --  20  CREATININE 0.90 0.80 0.95  CALCIUM 9.8  --  8.4*   PT/INR No results for input(s): LABPROT, INR in the last 72 hours. ABG No results for input(s): PHART, HCO3 in the last 72 hours.  Invalid input(s): PCO2, PO2  MEDS, Scheduled . heparin injection (subcutaneous)  5,000 Units Subcutaneous Q8H  . pantoprazole (PROTONIX) IV  40 mg Intravenous Q12H    Studies/Results: No results found.  Assessment: s/p Procedure(s): LAPAROSCOPIC TAKEDOWN HIATAL HERNIA NISEEN REDUCTION AND UPPER ENDOSCOPY Patient Active Problem List   Diagnosis Date Noted  . Hiatal hernia with GERD 12/01/2018  . Migraine 10/27/2018  . S/P TAVR (transcatheter aortic valve replacement) 05/30/2018  . Barrett's esophagus   . Severe aortic stenosis 03/06/2018  . Preoperative clearance 12/27/2017  . Aortic regurgitation 12/27/2017  . Vitamin D deficiency 06/30/2016  . Aortic  atherosclerosis ([place]) 06/24/2016  . Iron deficiency anemia due to chronic blood loss 06/24/2016  . Abnormal finding on MRI of brain 06/24/2016  . Chronic bronchitis ([place]) 08/14/2015  . Allergic rhinitis 07/03/2015  . Osteoporosis 02/19/2014  . S/P Nissen fundoplication (without gastrostomy tube) procedure 05/02/2013  . Hiatal hernia 02/19/2013  . GERD (gastroesophageal reflux disease) 08/03/2010  . Hyperlipidemia   . Anxiety and depression   . HTN (hypertension)   . Chronic venous insufficiency 07/06/2006    Post op pain  Plan: Cont IVF's until pt taking better PO  Switch to oxycodone and magic mouthwash for pain Increase PPI to BID   LOS: 1 day     .Rosario Adie, [facility], [place]   12/02/2018 10:03 AM

## 2018-12-03 NOTE — Plan of Care (Signed)
Patient sitting up in chair this morning; pain controlled at this time. No needs expressed. Will continue to monitor.  

## 2018-12-03 NOTE — Progress Notes (Signed)
2 Days Post-Op lap Nissen takedown Subjective: Complains of a burning pain when she swallows, having regurgitation of liquids.  Pain meds working better for her  Objective: Vital signs in last 24 hours: Temp:  [98.1 F (36.7 C)-99.5 F (37.5 C)] 98.1 F (36.7 C) (08/09 0604) Pulse Rate:  [75-93] 82 (08/09 0604) Resp:  [16-20] 20 (08/09 0604) BP: (110-123)/(55-75) 123/75 (08/09 0604) SpO2:  [97 %-100 %] 97 % (08/09 0604)   Intake/Output from previous day: 08/08 0701 - 08/09 0700 In: 2640 [P.O.:840; I.V.:1800] Out: 1700 [Urine:1700] Intake/Output this shift: No intake/output data recorded.   General appearance: alert and cooperative GI: normal findings: soft, non-tender  Incision: no significant drainage  Lab Results:  Recent Labs    12/01/18 1436 12/02/18 0313  WBC 10.9* 8.4  HGB 9.9* 7.8*  HCT 31.7* 24.8*  PLT 136* 122*   BMET Recent Labs    12/01/18 1436 12/02/18 0313  NA  --  135  K  --  4.5  CL  --  104  CO2  --  23  GLUCOSE  --  199*  BUN  --  20  CREATININE 0.80 0.95  CALCIUM  --  8.4*   PT/INR No results for input(s): LABPROT, INR in the last 72 hours. ABG No results for input(s): PHART, HCO3 in the last 72 hours.  Invalid input(s): PCO2, PO2  MEDS, Scheduled . heparin injection (subcutaneous)  5,000 Units Subcutaneous Q8H  . pantoprazole (PROTONIX) IV  40 mg Intravenous Q12H    Studies/Results: No results found.  Assessment: s/p Procedure(s): LAPAROSCOPIC TAKEDOWN HIATAL HERNIA NISEEN REDUCTION AND UPPER ENDOSCOPY Patient Active Problem List   Diagnosis Date Noted  . Hiatal hernia with GERD 12/01/2018  . Migraine 10/27/2018  . S/P TAVR (transcatheter aortic valve replacement) 05/30/2018  . Barrett's esophagus   . Severe aortic stenosis 03/06/2018  . Preoperative clearance 12/27/2017  . Aortic regurgitation 12/27/2017  . Vitamin D deficiency 06/30/2016  . Aortic atherosclerosis ([place]) 06/24/2016  . Iron deficiency anemia due to  chronic blood loss 06/24/2016  . Abnormal finding on MRI of brain 06/24/2016  . Chronic bronchitis ([place]) 08/14/2015  . Allergic rhinitis 07/03/2015  . Osteoporosis 02/19/2014  . S/P Nissen fundoplication (without gastrostomy tube) procedure 05/02/2013  . Hiatal hernia 02/19/2013  . GERD (gastroesophageal reflux disease) 08/03/2010  . Hyperlipidemia   . Anxiety and depression   . HTN (hypertension)   . Chronic venous insufficiency 07/06/2006    Post op pain  Plan: Cont IVF's until pt taking better PO  Cont oxycodone and magic mouthwash for pain Cont PPI BID   LOS: 2 days     .Rosario Adie, [facility], [place]   12/03/2018 8:35 AM

## 2018-12-04 ENCOUNTER — Inpatient Hospital Stay (HOSPITAL_COMMUNITY): Payer: Medicare Other

## 2018-12-04 LAB — CBC
HCT: 23.6 % — ABNORMAL LOW (ref 36.0–46.0)
Hemoglobin: 7.2 g/dL — ABNORMAL LOW (ref 12.0–15.0)
MCH: 28.6 pg (ref 26.0–34.0)
MCHC: 30.5 g/dL (ref 30.0–36.0)
MCV: 93.7 fL (ref 80.0–100.0)
Platelets: 124 10*3/uL — ABNORMAL LOW (ref 150–400)
RBC: 2.52 MIL/uL — ABNORMAL LOW (ref 3.87–5.11)
RDW: 13.2 % (ref 11.5–15.5)
WBC: 5.5 10*3/uL (ref 4.0–10.5)
nRBC: 0 % (ref 0.0–0.2)

## 2018-12-04 NOTE — Care Management Important Message (Signed)
Important Message  Patient Details  Name: Sherri Mcmillan MRN: [id number] Date of Birth: 01-05-1933   Medicare Important Message Given:  Yes. CMA printed out IM for the Case Management Nurse or CSW to give to the patient.        12/04/2018, 11:53 AM

## 2018-12-04 NOTE — Progress Notes (Signed)
Patient ID: Sherri Mcmillan, female   DOB: October 24, 1932, 85 y.o.   MRN: [id number] [facility] Progress Note:   3 Days Post-Op  Subjective: Mental status is alert; complaining of pain Objective: Vital signs in last 24 hours: Temp:  [99.6 F (37.6 C)-101.1 F (38.4 C)] 99.6 F (37.6 C) (08/10 0533) Pulse Rate:  [79-86] 79 (08/10 0533) Resp:  [16-18] 18 (08/10 0533) BP: (124-141)/(69-81) 135/81 (08/10 0533) SpO2:  [97 %-100 %] 100 % (08/10 0533)  Intake/Output from previous day: 08/09 0701 - 08/10 0700 In: 1560 [P.O.:360; I.V.:1200] Out: 2050 [Urine:2050] Intake/Output this shift: No intake/output data recorded.  Physical Exam: Work of breathing is not labored; wearing masks  Lab Results:  No results found for this or any previous visit (from the past 48 hour(s)).  Radiology/Results: No results found.  Anti-infectives: Anti-infectives (From admission, onward)   Start     Dose/Rate Route Frequency Ordered Stop   12/01/18 1800  ceFAZolin (ANCEF) IVPB 2g/100 mL premix     2 g 200 mL/hr over 30 Minutes Intravenous Every 8 hours 12/01/18 1425 12/01/18 1829   12/01/18 0730  ceFAZolin (ANCEF) IVPB 2g/100 mL premix     2 g 200 mL/hr over 30 Minutes Intravenous On call to O.R. 12/01/18 0725 12/01/18 0955      Assessment/Plan: Problem List: Patient Active Problem List   Diagnosis Date Noted  . Hiatal hernia with GERD 12/01/2018  . Migraine 10/27/2018  . S/P TAVR (transcatheter aortic valve replacement) 05/30/2018  . Barrett's esophagus   . Severe aortic stenosis 03/06/2018  . Preoperative clearance 12/27/2017  . Aortic regurgitation 12/27/2017  . Vitamin D deficiency 06/30/2016  . Aortic atherosclerosis ([place]) 06/24/2016  . Iron deficiency anemia due to chronic blood loss 06/24/2016  . Abnormal finding on MRI of brain 06/24/2016  . Chronic bronchitis ([place]) 08/14/2015  . Allergic rhinitis 07/03/2015  . Osteoporosis 02/19/2014  . S/P Nissen fundoplication  (without gastrostomy tube) procedure 05/02/2013  . Hiatal hernia 02/19/2013  . GERD (gastroesophageal reflux disease) 08/03/2010  . Hyperlipidemia   . Anxiety and depression   . HTN (hypertension)   . Chronic venous insufficiency 07/06/2006    Will check CBC;  Temp to 101 yesterday.  Burning pain when she swallows.  3 Days Post-Op    LOS: 3 days   Matt B. Hassell Done, MD, [facility] [phone] beeper [phone]  12/04/2018 9:25 AM

## 2018-12-05 MED ORDER — GABAPENTIN 100 MG PO CAPS
100.0000 mg | ORAL_CAPSULE | Freq: Three times a day (TID) | ORAL | Status: DC
  Administered 2018-12-05 (×2): 100 mg via ORAL
  Filled 2018-12-05 (×2): qty 1

## 2018-12-05 MED ORDER — CHLORTHALIDONE 25 MG PO TABS
25.0000 mg | ORAL_TABLET | Freq: Every day | ORAL | Status: DC
  Administered 2018-12-05: 25 mg via ORAL
  Filled 2018-12-05 (×6): qty 1

## 2018-12-05 MED ORDER — PANTOPRAZOLE SODIUM 40 MG PO TBEC
40.0000 mg | DELAYED_RELEASE_TABLET | Freq: Two times a day (BID) | ORAL | Status: DC
  Administered 2018-12-05 – 2018-12-08 (×6): 40 mg via ORAL
  Filled 2018-12-05 (×8): qty 1

## 2018-12-05 MED ORDER — CLOPIDOGREL BISULFATE 75 MG PO TABS
75.0000 mg | ORAL_TABLET | Freq: Every day | ORAL | Status: DC
  Administered 2018-12-05 – 2018-12-10 (×4): 75 mg via ORAL
  Filled 2018-12-05 (×5): qty 1

## 2018-12-05 MED ORDER — AMLODIPINE BESYLATE 10 MG PO TABS
10.0000 mg | ORAL_TABLET | Freq: Every day | ORAL | Status: DC
  Administered 2018-12-05 – 2018-12-10 (×6): 10 mg via ORAL
  Filled 2018-12-05 (×6): qty 1

## 2018-12-05 MED ORDER — SUMATRIPTAN SUCCINATE 50 MG PO TABS
50.0000 mg | ORAL_TABLET | ORAL | Status: DC | PRN
  Filled 2018-12-05: qty 1

## 2018-12-05 NOTE — Progress Notes (Signed)
Patient ID: Sherri DixonMattie M Ricchio, female   DOB: [birth year], 85 y.o.   MRN: [id number] [facility] Progress Note:   4 Days Post-Op  Subjective: Mental status is alert and talkative Objective: Vital signs in last 24 hours: Temp:  [98.2 F (36.8 C)-99.9 F (37.7 C)] 99.5 F (37.5 C) (08/11 0600) Pulse Rate:  [66-83] 83 (08/11 0600) Resp:  [16-18] 18 (08/11 0600) BP: (114-139)/(69-96) 126/76 (08/11 0600) SpO2:  [96 %-100 %] 97 % (08/11 0600)  Intake/Output from previous day: 08/10 0701 - 08/11 0700 In: 1559.2 [P.O.:600; I.V.:959.2] Out: 2325 [Urine:2325] Intake/Output this shift: Total I/O In: 290 [P.O.:120; I.V.:170] Out: 350 [Urine:350]  Physical Exam: Work of breathing is not labored.  Has been up walking.  Wants something more to eat.    Lab Results:  Results for orders placed or performed during the hospital encounter of 12/01/18 (from the past 48 hour(s))  CBC     Status: Abnormal   Collection Time: 12/04/18  9:45 AM  Result Value Ref Range   WBC 5.5 4.0 - 10.5 K/uL   RBC 2.52 (L) 3.87 - 5.11 MIL/uL   Hemoglobin 7.2 (L) 12.0 - 15.0 g/dL   HCT 40.923.6 (L) 81.136.0 - 91.446.0 %   MCV 93.7 80.0 - 100.0 fL   MCH 28.6 26.0 - 34.0 pg   MCHC 30.5 30.0 - 36.0 g/dL   RDW 78.213.2 95.611.5 - 21.315.5 %   Platelets 124 (L) 150 - 400 K/uL   nRBC 0.0 0.0 - 0.2 %    Comment: Performed at [facility], [address] [place], [place] [address]    Radiology/Results: Dg Chest Port 1 View  Result Date: 12/04/2018 CLINICAL DATA:  Fever. EXAM: PORTABLE CHEST 1 VIEW COMPARISON:  05/30/2018 FINDINGS: Central vascular stent appears the same. Heart size is normal. Aortic atherosclerosis as seen previously. Abnormal density at the right base that could be a combination of collapse, infiltrate and fluid in a fissure. Mild volume loss/scarring at the left base. Upper lungs remain clear. No heart failure evident. IMPRESSION: Worsened density at the right lung base that could be a combination  of collapse, infiltrate and pleural fluid. Electronically Signed   By: Paulina FusiMark  Shogry M.D.   On: 12/04/2018 10:45    Anti-infectives: Anti-infectives (From admission, onward)   Start     Dose/Rate Route Frequency Ordered Stop   12/01/18 1800  ceFAZolin (ANCEF) IVPB 2g/100 mL premix     2 g 200 mL/hr over 30 Minutes Intravenous Every 8 hours 12/01/18 1425 12/01/18 1829   12/01/18 0730  ceFAZolin (ANCEF) IVPB 2g/100 mL premix     2 g 200 mL/hr over 30 Minutes Intravenous On call to O.R. 12/01/18 0725 12/01/18 0955      Assessment/Plan: Problem List: Patient Active Problem List   Diagnosis Date Noted  . Hiatal hernia with GERD 12/01/2018  . Migraine 10/27/2018  . S/P TAVR (transcatheter aortic valve replacement) 05/30/2018  . Barrett's esophagus   . Severe aortic stenosis 03/06/2018  . Preoperative clearance 12/27/2017  . Aortic regurgitation 12/27/2017  . Vitamin D deficiency 06/30/2016  . Aortic atherosclerosis ([facility]) 06/24/2016  . Iron deficiency anemia due to chronic blood loss 06/24/2016  . Abnormal finding on MRI of brain 06/24/2016  . Chronic bronchitis ([facility]) 08/14/2015  . Allergic rhinitis 07/03/2015  . Osteoporosis 02/19/2014  . S/P Nissen fundoplication (without gastrostomy tube) procedure 05/02/2013  . Hiatal hernia 02/19/2013  . GERD (gastroesophageal reflux disease) 08/03/2010  . Hyperlipidemia   .  Anxiety and depression   . HTN (hypertension)   . Chronic venous insufficiency 07/06/2006    Will restart Plavix and most home meds.  Will check labs and cxr in the am.  Slow progress secondary to deconditioning.   4 Days Post-Op    LOS: 4 days   Matt B. Hassell Done, MD, [facility] [phone] beeper [phone]  12/05/2018 9:46 AM

## 2018-12-05 NOTE — Evaluation (Signed)
Physical Therapy Evaluation Patient Details Name: Sherri Mcmillan MRN: [id number] DOB: 1932/10/10 Today's Date: 12/05/2018   History of Present Illness  85 yo female admitted for recurrent hiatial hernia with GERD, s/p laparoscopic takedown hiatal hernia with reduction of nissen on 12/01/18. PMH includes hypertension, hyperlipidemia, TAVR 05/2018, venous insufficiency, depression/anxiety, migraine headaches and GERD and Barrett's esophogus, hiatal hernia (s/p nissen fundoplication 7829).  Clinical Impression   Pt presents with severe abdominal pain, generalized weakness, impaired standing balance and unsteadiness with gait, difficulty performing bed mobility s/p abdominal surgery, and decreased activity tolerance. Pt to benefit from acute PT to address deficits. Pt ambulated hallway distance with RW with min guard assist, at baseline pt walks without RW but today pt very unsteady without UE support. Pt presents as high fall risk at this time, PT recommending SNF as pt lives alone and does not have 24/7, or during the day, social support. Pt states she may be able to stay with a friend in [place], pt unsure at this time. PT to progress mobility as tolerated, and will continue to follow acutely.      Follow Up Recommendations SNF(pt refuses SNF, so PT recommending HHPT as secondary option)    Equipment Recommendations  Rolling walker with 5" wheels(youth RW)    Recommendations for Other Services       Precautions / Restrictions Precautions Precautions: Fall Precaution Comments: abdominal surgery - instructed pt in log roll technique Restrictions Weight Bearing Restrictions: No      Mobility  Bed Mobility Overal bed mobility: Needs Assistance Bed Mobility: Sidelying to Sit;Rolling Rolling: Min guard Sidelying to sit: Min assist;HOB elevated       General bed mobility comments: pt educated in log roll technique for abdominal comfort and protection. Verbal cuing for reaching for  bedrail to roll onto L side. Min assist for LE management and steadying upon coming to sitting, pt with increased time to rise with use of bedrails to come to sitting.  Transfers Overall transfer level: Needs assistance Equipment used: None Transfers: Sit to/from Stand Sit to Stand: Min assist         General transfer comment: Min assist for steadying, pt deferred gait belt and RW for initial stand as pt had to use the restroom urgently.  Ambulation/Gait Ambulation/Gait assistance: Min assist;Min guard Gait Distance (Feet): 250 Feet Assistive device: 1 person hand held assist;Rolling walker (2 wheeled) Gait Pattern/deviations: Step-through pattern;Decreased stride length;Shuffle;Trunk flexed Gait velocity: decr   General Gait Details: Pt initially deferred RW use, and required min assist for steadying with HHA and trunk support. Pt with small, shuffling steps without RW use. PT encouraged RW use due to pt unsteadiness, steadiness improved and pt required min guard assist only. Verbal cuing for hallway navigation wiht RW, placement in RW. Pt with intermittent sharp abdominal pain during ambulation.  Stairs            Wheelchair Mobility    Modified Rankin (Stroke Patients Only)       Balance Overall balance assessment: Needs assistance;History of Falls Sitting-balance support: No upper extremity supported;Feet supported Sitting balance-Leahy Scale: Good     Standing balance support: No upper extremity supported Standing balance-Leahy Scale: Fair Standing balance comment: able to stand without UE assist, requires external support for dynamic standing balance                             Pertinent Vitals/Pain Pain Assessment: 0-10 Pain Score:  7  Pain Location: abdomen Pain Descriptors / Indicators: Sore;Discomfort;Sharp Pain Intervention(s): Limited activity within patient's tolerance;Monitored during session;Repositioned;Heat applied    Home Living  Family/patient expects to be discharged to:: Private residence Living Arrangements: Alone Available Help at Discharge: Family;Available PRN/intermittently(pt states her friends all work, and her pastor who normally helps her just had surgery) Type of Home: Apartment Home Access: Level entry     Home Layout: One level Home Equipment: None      Prior Function Level of Independence: Independent         Comments: Pt says she likes to do things for herself, drives     Hand Dominance   Dominant Hand: Right    Extremity/Trunk Assessment   Upper Extremity Assessment Upper Extremity Assessment: Generalized weakness    Lower Extremity Assessment Lower Extremity Assessment: Generalized weakness    Cervical / Trunk Assessment Cervical / Trunk Assessment: Normal  Communication   Communication: Expressive difficulties(soft spoken, secondary to esophageal/hiatal dysfunction)  Cognition Arousal/Alertness: Awake/alert Behavior During Therapy: WFL for tasks assessed/performed Overall Cognitive Status: Within Functional Limits for tasks assessed                                        General Comments      Exercises     Assessment/Plan    PT Assessment Patient needs continued PT services  PT Problem List Decreased strength;Decreased mobility;Decreased safety awareness;Decreased activity tolerance;Decreased balance;Decreased knowledge of use of DME;Pain       PT Treatment Interventions DME instruction;Therapeutic activities;Gait training;Therapeutic exercise;Patient/family education;Balance training;Functional mobility training    PT Goals (Current goals can be found in the Care Plan section)  Acute Rehab PT Goals Patient Stated Goal: return to independence PT Goal Formulation: With patient Time For Goal Achievement: 12/19/18 Potential to Achieve Goals: Good    Frequency Min 3X/week   Barriers to discharge        Co-evaluation                AM-PAC PT "6 Clicks" Mobility  Outcome Measure Help needed turning from your back to your side while in a flat bed without using bedrails?: A Little Help needed moving from lying on your back to sitting on the side of a flat bed without using bedrails?: A Little Help needed moving to and from a bed to a chair (including a wheelchair)?: A Little Help needed standing up from a chair using your arms (e.g., wheelchair or bedside chair)?: A Little Help needed to walk in hospital room?: A Little Help needed climbing 3-5 steps with a railing? : A Lot 6 Click Score: 17    End of Session Equipment Utilized During Treatment: Gait belt Activity Tolerance: Patient limited by fatigue Patient left: in chair;with call bell/phone within reach;with chair alarm set Nurse Communication: Mobility status PT Visit Diagnosis: Other abnormalities of gait and mobility (R26.89);Pain Pain - Right/Left: (general) Pain - part of body: (abdomen)    Time: 1203-1230 PT Time Calculation (min) (ACUTE ONLY): 27 min   Charges:   PT Evaluation $PT Eval Low Complexity: 1 Low PT Treatments $Gait Training: 8-22 mins       Nicola Police D , PT Acute Rehabilitation Services Pager [phone]  Office [phone]   D  12/05/2018, 1:18 PM

## 2018-12-05 NOTE — Progress Notes (Signed)
The patient is receiving Protonix by the intravenous route.  Based on criteria approved by the Pharmacy and [place], the medication is being converted to the equivalent oral dose form.  These criteria include: -No active GI bleeding -Able to tolerate diet of full liquids (or better) or tube feeding -Able to tolerate other medications by the oral or enteral route  If you have any questions about this conversion, please contact the Pharmacy Department (phone [phone]).  Thank you. Eudelia Bunch, Pharm.D [phone] 12/05/2018 10:16 AM

## 2018-12-06 ENCOUNTER — Inpatient Hospital Stay (HOSPITAL_COMMUNITY): Payer: Medicare Other

## 2018-12-06 LAB — COMPREHENSIVE METABOLIC PANEL
ALT: 24 U/L (ref 0–44)
AST: 22 U/L (ref 15–41)
Albumin: 3.6 g/dL (ref 3.5–5.0)
Alkaline Phosphatase: 44 U/L (ref 38–126)
Anion gap: 11 (ref 5–15)
BUN: 8 mg/dL (ref 8–23)
CO2: 24 mmol/L (ref 22–32)
Calcium: 8.9 mg/dL (ref 8.9–10.3)
Chloride: 98 mmol/L (ref 98–111)
Creatinine, Ser: 0.91 mg/dL (ref 0.44–1.00)
GFR calc Af Amer: >60 mL/min (ref >60)
GFR calc non Af Amer: 58 mL/min — ABNORMAL LOW (ref >60)
Glucose, Bld: 109 mg/dL — ABNORMAL HIGH (ref 70–99)
Potassium: 4 mmol/L (ref 3.5–5.1)
Sodium: 133 mmol/L — ABNORMAL LOW (ref 135–145)
Total Bilirubin: 1.7 mg/dL — ABNORMAL HIGH (ref 0.3–1.2)
Total Protein: 7.1 g/dL (ref 6.5–8.1)

## 2018-12-06 LAB — CBC WITH DIFFERENTIAL/PLATELET
Abs Immature Granulocytes: 0.03 10*3/uL (ref 0.00–0.07)
Basophils Absolute: 0 10*3/uL (ref 0.0–0.1)
Basophils Relative: 0 %
Eosinophils Absolute: 0.1 10*3/uL (ref 0.0–0.5)
Eosinophils Relative: 2 %
HCT: 24 % — ABNORMAL LOW (ref 36.0–46.0)
Hemoglobin: 7.3 g/dL — ABNORMAL LOW (ref 12.0–15.0)
Immature Granulocytes: 1 %
Lymphocytes Relative: 20 %
Lymphs Abs: 1.1 10*3/uL (ref 0.7–4.0)
MCH: 28.4 pg (ref 26.0–34.0)
MCHC: 30.4 g/dL (ref 30.0–36.0)
MCV: 93.4 fL (ref 80.0–100.0)
Monocytes Absolute: 0.8 10*3/uL (ref 0.1–1.0)
Monocytes Relative: 15 %
Neutro Abs: 3.2 10*3/uL (ref 1.7–7.7)
Neutrophils Relative %: 62 %
Platelets: 162 10*3/uL (ref 150–400)
RBC: 2.57 MIL/uL — ABNORMAL LOW (ref 3.87–5.11)
RDW: 13.2 % (ref 11.5–15.5)
WBC: 5.2 10*3/uL (ref 4.0–10.5)
nRBC: 0 % (ref 0.0–0.2)

## 2018-12-06 MED ORDER — FLEET ENEMA 7-19 GM/118ML RE ENEM: 1.0000 | ENEMA | Freq: Every day | RECTAL | Status: DC | PRN

## 2018-12-06 MED ORDER — BISACODYL 10 MG RE SUPP
10.0000 mg | Freq: Every day | RECTAL | Status: DC | PRN
  Administered 2018-12-06: 10 mg via RECTAL
  Filled 2018-12-06 (×2): qty 1

## 2018-12-06 NOTE — Progress Notes (Signed)
PT Cancellation Note  Patient Details Name: Sherri Mcmillan MRN: [id number] DOB: 1933-03-17   Cancelled Treatment:    Reason Eval/Treat Not Completed: Patient declined, no reason specified - PT attempt x2, first time pt states she just got back to bed and wants to rest. On second attempt, RN just gave suppository and RN defers until later. Will check back as schedule allows.   Julien Girt, PT Acute Rehabilitation Services Pager [phone]  Office [phone]    [place] 12/06/2018, 2:29 PM

## 2018-12-06 NOTE — Progress Notes (Signed)
Patient ID: Sherri Mcmillan, female   DOB: 1933-03-28, 85 y.o.   MRN: [id number] [facility] Surgery Progress Note:   5 Days Post-Op  Subjective: Mental status is clear and pleasant Objective: Vital signs in last 24 hours: Temp:  [98 F (36.7 C)-98.7 F (37.1 C)] 98.4 F (36.9 C) (08/12 1340) Pulse Rate:  [74-83] 77 (08/12 1340) Resp:  [16] 16 (08/12 1340) BP: (120-130)/(67-83) 120/67 (08/12 1340) SpO2:  [96 %-98 %] 98 % (08/12 1340)  Intake/Output from previous day: 08/11 0701 - 08/12 0700 In: 1899.2 [P.O.:900; I.V.:999.2] Out: 2800 [Urine:2800] Intake/Output this shift: Total I/O In: 360 [P.O.:360] Out: 750 [Urine:750]  Physical Exam: Work of breathing is not labored.  Eating slowly.  Deconditioned.    Lab Results:  Results for orders placed or performed during the hospital encounter of 12/01/18 (from the past 48 hour(s))  CBC with Differential/Platelet     Status: Abnormal   Collection Time: 12/06/18  3:59 AM  Result Value Ref Range   WBC 5.2 4.0 - 10.5 K/uL   RBC 2.57 (L) 3.87 - 5.11 MIL/uL   Hemoglobin 7.3 (L) 12.0 - 15.0 g/dL   HCT 24.0 (L) 36.0 - 46.0 %   MCV 93.4 80.0 - 100.0 fL   MCH 28.4 26.0 - 34.0 pg   MCHC 30.4 30.0 - 36.0 g/dL   RDW 13.2 11.5 - 15.5 %   Platelets 162 150 - 400 K/uL   nRBC 0.0 0.0 - 0.2 %   Neutrophils Relative % 62 %   Neutro Abs 3.2 1.7 - 7.7 K/uL   Lymphocytes Relative 20 %   Lymphs Abs 1.1 0.7 - 4.0 K/uL   Monocytes Relative 15 %   Monocytes Absolute 0.8 0.1 - 1.0 K/uL   Eosinophils Relative 2 %   Eosinophils Absolute 0.1 0.0 - 0.5 K/uL   Basophils Relative 0 %   Basophils Absolute 0.0 0.0 - 0.1 K/uL   Immature Granulocytes 1 %   Abs Immature Granulocytes 0.03 0.00 - 0.07 K/uL    Comment: Performed at [facility], [place] [address], [place], [place] [address]  Comprehensive metabolic panel     Status: Abnormal   Collection Time: 12/06/18  3:59 AM  Result Value Ref Range   Sodium 133 (L) 135 - 145 mmol/L   Potassium 4.0 3.5 - 5.1 mmol/L   Chloride 98 98 - 111 mmol/L   CO2 24 22 - 32 mmol/L   Glucose, Bld 109 (H) 70 - 99 mg/dL   BUN 8 8 - 23 mg/dL   Creatinine, Ser 0.91 0.44 - 1.00 mg/dL   Calcium 8.9 8.9 - 10.3 mg/dL   Total Protein 7.1 6.5 - 8.1 g/dL   Albumin 3.6 3.5 - 5.0 g/dL   AST 22 15 - 41 U/L   ALT 24 0 - 44 U/L   Alkaline Phosphatase 44 38 - 126 U/L   Total Bilirubin 1.7 (H) 0.3 - 1.2 mg/dL   GFR calc non Af Amer 58 (L) >60 mL/min   GFR calc Af Amer >60 >60 mL/min   Anion gap 11 5 - 15    Comment: Performed at [facility], [place] [address], [place], [place] [address]    Radiology/Results: Dg Chest Port 1 View  Result Date: 12/06/2018 CLINICAL DATA:  Postop check EXAM: PORTABLE CHEST 1 VIEW COMPARISON:  12/04/2018 FINDINGS: Cardiac shadows within normal limits. Aortic calcifications are again seen. Changes of prior TAVR are again noted and stable. Persistent density in  the right lung base is noted although slightly improved. No new focal infiltrate or effusion is seen. No bony abnormality is noted. IMPRESSION: Slight improvement in right basilar infiltrate. Electronically Signed   By: Alcide CleverMark  Lukens M.D.   On: 12/06/2018 08:14    Anti-infectives: Anti-infectives (From admission, onward)   Start     Dose/Rate Route Frequency Ordered Stop   12/01/18 1800  ceFAZolin (ANCEF) IVPB 2g/100 mL premix     2 g 200 mL/hr over 30 Minutes Intravenous Every 8 hours 12/01/18 1425 12/01/18 1829   12/01/18 0730  ceFAZolin (ANCEF) IVPB 2g/100 mL premix     2 g 200 mL/hr over 30 Minutes Intravenous On call to O.R. 12/01/18 0725 12/01/18 0955      Assessment/Plan: Problem List: Patient Active Problem List   Diagnosis Date Noted  . Hiatal hernia with GERD 12/01/2018  . Migraine 10/27/2018  . S/P TAVR (transcatheter aortic valve replacement) 05/30/2018  . Barrett's esophagus   . Severe aortic stenosis 03/06/2018  . Preoperative clearance 12/27/2017  . Aortic  regurgitation 12/27/2017  . Vitamin D deficiency 06/30/2016  . Aortic atherosclerosis (HCC) 06/24/2016  . Iron deficiency anemia due to chronic blood loss 06/24/2016  . Abnormal finding on MRI of brain 06/24/2016  . Chronic bronchitis (HCC) 08/14/2015  . Allergic rhinitis 07/03/2015  . Osteoporosis 02/19/2014  . S/P Nissen fundoplication (without gastrostomy tube) procedure 05/02/2013  . Hiatal hernia 02/19/2013  . GERD (gastroesophageal reflux disease) 08/03/2010  . Hyperlipidemia   . Anxiety and depression   . HTN (hypertension)   . Chronic venous insufficiency 07/06/2006    Anemic; right lung infiltrate is better.  Walking a little more.  Hopefully ready for discharge by Saturday.   5 Days Post-Op    LOS: 5 days   Matt B. Daphine DeutscherMartin, MD, [facility]  [facility] [phone] beeper [phone]  12/06/2018 3:24 PM

## 2018-12-06 NOTE — Progress Notes (Signed)
Physical Therapy Treatment Patient Details Name: Sherri Mcmillan MRN: [id number] DOB: 10-21-1932 Today's Date: 12/06/2018    History of Present Illness 85 yo female admitted for recurrent hiatial hernia with GERD, s/p laparoscopic takedown hiatal hernia with reduction of nissen on 12/01/18. PMH includes hypertension, hyperlipidemia, TAVR 05/2018, venous insufficiency, depression/anxiety, migraine headaches and GERD and Barrett's esophogus, hiatal hernia (s/p nissen fundoplication 0932).    PT Comments    Pt with improved ambulation distance and steadiness in standing today, as pt did not require a RW to ambulate in hallway. Pt continues to have moderate abdominal pain especially with erect standing and bed mobility, PT reinforcing form with both. PT updated recommendation to reflect HHPT, as pt states her cousin from [place] will be staying with her and assisting her as needed and pt is moving better at this time. PT to continue to follow acutely.    Follow Up Recommendations  Home health PT;Supervision for mobility/OOB     Equipment Recommendations  None recommended by PT    Recommendations for Other Services       Precautions / Restrictions Precautions Precautions: Fall Precaution Comments: abdominal surgery - instructed pt in log roll technique Restrictions Weight Bearing Restrictions: No    Mobility  Bed Mobility Overal bed mobility: Needs Assistance Bed Mobility: Rolling;Sit to Sidelying Rolling: Supervision       Sit to sidelying: Supervision;HOB elevated General bed mobility comments: supervision for rolling and sit to sidelying, min verbal cuing for sequencing. Increased time to perform.  Transfers Overall transfer level: Needs assistance Equipment used: None Transfers: Sit to/from Stand Sit to Stand: Supervision         General transfer comment: supervision for safety, steady upon standing.  Ambulation/Gait Ambulation/Gait assistance: Min guard;Supervision Gait  Distance (Feet): 450 Feet Assistive device: None Gait Pattern/deviations: Step-through pattern;Decreased stride length;Trunk flexed Gait velocity: decr   General Gait Details: min guard to supervision level assist for safety, pt with good ambulation speed this session with occasional reaching for environment for steadying.   Stairs             Wheelchair Mobility    Modified Rankin (Stroke Patients Only)       Balance Overall balance assessment: Needs assistance;History of Falls Sitting-balance support: No upper extremity supported;Feet supported Sitting balance-Leahy Scale: Good     Standing balance support: No upper extremity supported Standing balance-Leahy Scale: Fair Standing balance comment: occasionally reaches for environment                            Cognition Arousal/Alertness: Awake/alert Behavior During Therapy: WFL for tasks assessed/performed Overall Cognitive Status: Within Functional Limits for tasks assessed                                        Exercises      General Comments        Pertinent Vitals/Pain Pain Assessment: 0-10 Pain Score: 5  Pain Location: abdomen Pain Descriptors / Indicators: Sore;Discomfort;Sharp Pain Intervention(s): Limited activity within patient's tolerance;Monitored during session;Premedicated before session;Repositioned    Home Living                      Prior Function            PT Goals (current goals can now be found in the care plan section) Acute Rehab  PT Goals Patient Stated Goal: return to independence PT Goal Formulation: With patient Time For Goal Achievement: 12/19/18 Potential to Achieve Goals: Good Progress towards PT goals: Progressing toward goals    Frequency    Min 3X/week      PT Plan Discharge plan needs to be updated;Equipment recommendations need to be updated    Co-evaluation              AM-PAC PT "6 Clicks" Mobility   Outcome  Measure  Help needed turning from your back to your side while in a flat bed without using bedrails?: A Little Help needed moving from lying on your back to sitting on the side of a flat bed without using bedrails?: None Help needed moving to and from a bed to a chair (including a wheelchair)?: None Help needed standing up from a chair using your arms (e.g., wheelchair or bedside chair)?: None Help needed to walk in hospital room?: A Little Help needed climbing 3-5 steps with a railing? : A Little 6 Click Score: 21    End of Session Equipment Utilized During Treatment: Gait belt Activity Tolerance: Patient tolerated treatment well;Patient limited by pain Patient left: in bed;with call bell/phone within reach Nurse Communication: Mobility status PT Visit Diagnosis: Other abnormalities of gait and mobility (R26.89);Pain Pain - Right/Left: (general) Pain - part of body: (abdomen)     Time: [id number] PT Time Calculation (min) (ACUTE ONLY): 18 min  Charges:  $Gait Training: 8-22 mins                    Nicola Police D , PT Acute Rehabilitation Services Pager [phone]  Office [phone]    D Despina Hiddenure 12/06/2018, 4:22 PM

## 2018-12-07 MED ORDER — POLYETHYLENE GLYCOL 3350 17 G PO PACK
17.0000 g | PACK | Freq: Every day | ORAL | Status: DC | PRN
  Administered 2018-12-08: 17 g via ORAL
  Filled 2018-12-07: qty 1

## 2018-12-07 NOTE — Progress Notes (Signed)
Pt refused the Plavix and [place] again this am. I explained the risk of blood clots but she still refused it.

## 2018-12-07 NOTE — Care Management Important Message (Signed)
Important Message  Patient Details IM Letter given to Velva Harman RN to present to the Patient Name: Sherri Mcmillan MRN: [id number] Date of Birth: 1932/07/08   Medicare Important Message Given:  Yes     Kerin Salen 12/07/2018, 10:09 AM

## 2018-12-07 NOTE — Progress Notes (Signed)
Patient ID: Sherri Mcmillan, female   DOB: 1933-01-30, 85 y.o.   MRN: [id number] [facility] Surgery Progress Note:   6 Days Post-Op  Subjective: Mental status is clear but somewhat frustrated with her night care.   Objective: Vital signs in last 24 hours: Temp:  [98.4 F (36.9 C)-99.6 F (37.6 C)] 98.9 F (37.2 C) (08/13 0510) Pulse Rate:  [77-90] 83 (08/13 0510) Resp:  [14-17] 14 (08/13 0510) BP: (120-123)/(63-76) 122/63 (08/13 0510) SpO2:  [94 %-98 %] 97 % (08/13 0510)  Intake/Output from previous day: 08/12 0701 - 08/13 0700 In: 1980 [P.O.:780; I.V.:1200] Out: 2125 [Urine:2125] Intake/Output this shift: Total I/O In: 320 [P.O.:120; I.V.:200] Out: 350 [Urine:350]  Physical Exam: Work of breathing is not labored.  Incisions are healing just fine.  She has not had a BM just yet but we will try again with an enema.    Lab Results:  Results for orders placed or performed during the hospital encounter of 12/01/18 (from the past 48 hour(s))  CBC with Differential/Platelet     Status: Abnormal   Collection Time: 12/06/18  3:59 AM  Result Value Ref Range   WBC 5.2 4.0 - 10.5 K/uL   RBC 2.57 (L) 3.87 - 5.11 MIL/uL   Hemoglobin 7.3 (L) 12.0 - 15.0 g/dL   HCT 24.0 (L) 36.0 - 46.0 %   MCV 93.4 80.0 - 100.0 fL   MCH 28.4 26.0 - 34.0 pg   MCHC 30.4 30.0 - 36.0 g/dL   RDW 13.2 11.5 - 15.5 %   Platelets 162 150 - 400 K/uL   nRBC 0.0 0.0 - 0.2 %   Neutrophils Relative % 62 %   Neutro Abs 3.2 1.7 - 7.7 K/uL   Lymphocytes Relative 20 %   Lymphs Abs 1.1 0.7 - 4.0 K/uL   Monocytes Relative 15 %   Monocytes Absolute 0.8 0.1 - 1.0 K/uL   Eosinophils Relative 2 %   Eosinophils Absolute 0.1 0.0 - 0.5 K/uL   Basophils Relative 0 %   Basophils Absolute 0.0 0.0 - 0.1 K/uL   Immature Granulocytes 1 %   Abs Immature Granulocytes 0.03 0.00 - 0.07 K/uL    Comment: Performed at [facility], [place] [address] [place], [place] [address]  Comprehensive metabolic panel      Status: Abnormal   Collection Time: 12/06/18  3:59 AM  Result Value Ref Range   Sodium 133 (L) 135 - 145 mmol/L   Potassium 4.0 3.5 - 5.1 mmol/L   Chloride 98 98 - 111 mmol/L   CO2 24 22 - 32 mmol/L   Glucose, Bld 109 (H) 70 - 99 mg/dL   BUN 8 8 - 23 mg/dL   Creatinine, Ser 0.91 0.44 - 1.00 mg/dL   Calcium 8.9 8.9 - 10.3 mg/dL   Total Protein 7.1 6.5 - 8.1 g/dL   Albumin 3.6 3.5 - 5.0 g/dL   AST 22 15 - 41 U/L   ALT 24 0 - 44 U/L   Alkaline Phosphatase 44 38 - 126 U/L   Total Bilirubin 1.7 (H) 0.3 - 1.2 mg/dL   GFR calc non Af Amer 58 (L) >60 mL/min   GFR calc Af Amer >60 >60 mL/min   Anion gap 11 5 - 15    Comment: Performed at [facility], [place] [address], [place], [place] [address]    Radiology/Results: Dg Chest Port 1 View  Result Date: 12/06/2018 CLINICAL DATA:  Postop check EXAM: PORTABLE CHEST 1  VIEW COMPARISON:  12/04/2018 FINDINGS: Cardiac shadows within normal limits. Aortic calcifications are again seen. Changes of prior TAVR are again noted and stable. Persistent density in the right lung base is noted although slightly improved. No new focal infiltrate or effusion is seen. No bony abnormality is noted. IMPRESSION: Slight improvement in right basilar infiltrate. Electronically Signed   By: Alcide CleverMark  Lukens M.D.   On: 12/06/2018 08:14    Anti-infectives: Anti-infectives (From admission, onward)   Start     Dose/Rate Route Frequency Ordered Stop   12/01/18 1800  ceFAZolin (ANCEF) IVPB 2g/100 mL premix     2 g 200 mL/hr over 30 Minutes Intravenous Every 8 hours 12/01/18 1425 12/01/18 1829   12/01/18 0730  ceFAZolin (ANCEF) IVPB 2g/100 mL premix     2 g 200 mL/hr over 30 Minutes Intravenous On call to O.R. 12/01/18 0725 12/01/18 0955      Assessment/Plan: Problem List: Patient Active Problem List   Diagnosis Date Noted  . Hiatal hernia with GERD 12/01/2018  . Migraine 10/27/2018  . S/P TAVR (transcatheter aortic valve replacement) 05/30/2018  .  Barrett's esophagus   . Severe aortic stenosis 03/06/2018  . Preoperative clearance 12/27/2017  . Aortic regurgitation 12/27/2017  . Vitamin D deficiency 06/30/2016  . Aortic atherosclerosis (HCC) 06/24/2016  . Iron deficiency anemia due to chronic blood loss 06/24/2016  . Abnormal finding on MRI of brain 06/24/2016  . Chronic bronchitis (HCC) 08/14/2015  . Allergic rhinitis 07/03/2015  . Osteoporosis 02/19/2014  . S/P Nissen fundoplication (without gastrostomy tube) procedure 05/02/2013  . Hiatal hernia 02/19/2013  . GERD (gastroesophageal reflux disease) 08/03/2010  . Hyperlipidemia   . Anxiety and depression   . HTN (hypertension)   . Chronic venous insufficiency 07/06/2006    Constipation-enema ordered along with Miralax.  Dr. Magnus IvanBlackman to see Friday and hopeful discharge on Saturday.   6 Days Post-Op    LOS: 6 days   Matt B. Daphine DeutscherMartin, MD, [facility]  [facility] [phone] beeper [phone]  12/07/2018 1:04 PM

## 2018-12-08 MED ORDER — DOCUSATE SODIUM 100 MG PO CAPS
100.0000 mg | ORAL_CAPSULE | Freq: Two times a day (BID) | ORAL | Status: DC
  Administered 2018-12-08 – 2018-12-09 (×4): 100 mg via ORAL
  Filled 2018-12-08 (×5): qty 1

## 2018-12-08 MED ORDER — ACETAMINOPHEN 325 MG PO TABS: 650.0000 mg | ORAL_TABLET | Freq: Four times a day (QID) | ORAL | Status: DC | PRN

## 2018-12-08 MED ORDER — BISACODYL 10 MG RE SUPP
10.0000 mg | Freq: Once | RECTAL | Status: AC
  Administered 2018-12-08: 10 mg via RECTAL
  Filled 2018-12-08: qty 1

## 2018-12-08 NOTE — Progress Notes (Signed)
7 Days Post-Op  Subjective: CC:  Patient states that her pain is controlled currently. Minimal to no abdominal pain except around her incisions. She is tolerating a diet without any N/V. Passing flatus but no BM since admission. She has refused her Plavix and Heparin over the last several days. She does have a history of DVT in the past. We discussed the risk of DVT/PE. She continues to refuse medications. No CP, SOB, cough, hemoptysis or LE swelling. She is mobilizing the halls with her walker x 3 yesterday and wearing her SCD's. She has no other complaints.   Objective: Vital signs in last 24 hours: Temp:  [97.9 F (36.6 C)-99.2 F (37.3 C)] 99.2 F (37.3 C) (08/14 0610) Pulse Rate:  [80-87] 80 (08/14 0610) Resp:  [14-15] 15 (08/14 0610) BP: (108-124)/(61-76) 115/66 (08/14 0610) SpO2:  [91 %-100 %] 100 % (08/14 0610) Last BM Date: 12/01/18  Intake/Output from previous day: 08/13 0701 - 08/14 0700 In: 1569.2 [P.O.:360; I.V.:1209.2] Out: 800 [Urine:800] Intake/Output this shift: No intake/output data recorded.  PE: Gen:  Alert, frail elderly female in NAD Card:  RRR Pulm:  CTA b/l, normal rate and effort  Abd: Soft, ND, NT, +BS, Incisions with glue intact appears well and are without drainage, bleeding, or signs of infection Ext:  No edema to b/l LE's Skin: no rashes noted, warm and dry  Lab Results:  Recent Labs    12/06/18 0359  WBC 5.2  HGB 7.3*  HCT 24.0*  PLT 162   BMET Recent Labs    12/06/18 0359  NA 133*  K 4.0  CL 98  CO2 24  GLUCOSE 109*  BUN 8  CREATININE 0.91  CALCIUM 8.9   PT/INR No results for input(s): LABPROT, INR in the last 72 hours. CMP     Component Value Date/Time   NA 133 (L) 12/06/2018 0359   NA 141 06/08/2018 1513   K 4.0 12/06/2018 0359   CL 98 12/06/2018 0359   CO2 24 12/06/2018 0359   GLUCOSE 109 (H) 12/06/2018 0359   BUN 8 12/06/2018 0359   BUN 17 06/08/2018 1513   CREATININE 0.91 12/06/2018 0359   CREATININE 0.83  04/10/2014 1426   CALCIUM 8.9 12/06/2018 0359   PROT 7.1 12/06/2018 0359   PROT 7.1 12/16/2016 1407   ALBUMIN 3.6 12/06/2018 0359   ALBUMIN 4.4 12/16/2016 1407   AST 22 12/06/2018 0359   ALT 24 12/06/2018 0359   ALKPHOS 44 12/06/2018 0359   BILITOT 1.7 (H) 12/06/2018 0359   BILITOT 0.2 12/16/2016 1407   GFRNONAA 58 (L) 12/06/2018 0359   GFRNONAA 67 04/10/2014 1426   GFRAA >60 12/06/2018 0359   GFRAA 78 04/10/2014 1426   Lipase     Component Value Date/Time   LIPASE 26 02/21/2013 2140       Studies/Results: No results found.  Anti-infectives: Anti-infectives (From admission, onward)   Start     Dose/Rate Route Frequency Ordered Stop   12/01/18 1800  ceFAZolin (ANCEF) IVPB 2g/100 mL premix     2 g 200 mL/hr over 30 Minutes Intravenous Every 8 hours 12/01/18 1425 12/01/18 1829   12/01/18 0730  ceFAZolin (ANCEF) IVPB 2g/100 mL premix     2 g 200 mL/hr over 30 Minutes Intravenous On call to O.R. 12/01/18 0725 12/01/18 0955      Assessment/Plan Aortic Stenosis Hx of DVT HTN - Home meds HLD GERD Anemia   S/P Laparoscopic takedown hiatal hernia with reduction of Nissen to  270 degree; endoscopy - Dr. Daphine DeutscherMartin - 8/7 - POD #7  - PPI BID - Continue diet - Mobilize and IS - PT recommended HH. Ordered  - Home Saturday   FEN - Soft VTE - SCDs, Mobilize, Heparin and Plavix. Patient refusing Heparin and Plavix. We discussed the risks of not taking Heparin and Plavix especially with her hx of DVT ID - Ancef periop   LOS: 7 days    Jacinto HalimMichael M  , [facility] 12/08/2018, 8:53 AM Pager: [phone]

## 2018-12-08 NOTE — Progress Notes (Signed)
Physical Therapy Treatment Patient Details Name: Sherri Mcmillan MRN: [id number] DOB: 07-01-1932 Today's Date: 12/08/2018    History of Present Illness 85 yo female admitted for recurrent hiatial hernia with GERD, s/p laparoscopic takedown hiatal hernia with reduction of nissen on 12/01/18. PMH includes hypertension, hyperlipidemia, TAVR 05/2018, venous insufficiency, depression/anxiety, migraine headaches and GERD and Barrett's esophogus, hiatal hernia (s/p nissen fundoplication 2015).    PT Comments    Pt with improved ambulation distance this session with less fatigue, pt reporting abdominal pain and reflux during first lap of hallway. Pt is considered a fall risk, given her performance on DGI this session. PT continuing to recommend HHPT to address pt deficits. Pt plans to d/c home tomorrow, will continue to follow acutely.    Follow Up Recommendations  Home health PT;Supervision for mobility/OOB     Equipment Recommendations  None recommended by PT    Recommendations for Other Services       Precautions / Restrictions Precautions Precautions: Fall Precaution Comments: abdominal surgery - instructed pt in log roll technique Restrictions Weight Bearing Restrictions: No    Mobility  Bed Mobility Overal bed mobility: Needs Assistance             General bed mobility comments: pt up in chair upon PT arrival and requesting to stay in chair post-PT.  Transfers Overall transfer level: Needs assistance Equipment used: None Transfers: Sit to/from Stand Sit to Stand: Supervision         General transfer comment: supervision for safety, steady upon standing.  Ambulation/Gait Ambulation/Gait assistance: Min guard;Supervision Gait Distance (Feet): 900 Feet Assistive device: None Gait Pattern/deviations: Step-through pattern;Decreased stride length;Trunk flexed Gait velocity: WFL   General Gait Details: supervision for safety, pt with pause x1 minute during hallway  ambulation due reflux and spitting. Pt with occasional reaching for railing for steadying.   Stairs             Wheelchair Mobility    Modified Rankin (Stroke Patients Only)       Balance Overall balance assessment: Needs assistance;History of Falls Sitting-balance support: No upper extremity supported;Feet supported Sitting balance-Leahy Scale: Good     Standing balance support: No upper extremity supported Standing balance-Leahy Scale: Fair Standing balance comment: occasionally reaches for environment                 Standardized Balance Assessment Standardized Balance Assessment : Dynamic Gait Index   Dynamic Gait Index Level Surface: Normal Change in Gait Speed: Mild Impairment Gait with Horizontal Head Turns: Mild Impairment Gait with Vertical Head Turns: Moderate Impairment Gait and Pivot Turn: Mild Impairment Step Over Obstacle: Mild Impairment Step Around Obstacles: Normal Steps: Mild Impairment(did not physically perform steps, basing scoring off PT clinical judgment of pt) Total Score: 17      Cognition Arousal/Alertness: Awake/alert Behavior During Therapy: WFL for tasks assessed/performed Overall Cognitive Status: Within Functional Limits for tasks assessed                                        Exercises      General Comments        Pertinent Vitals/Pain Pain Assessment: 0-10 Pain Score: 5  Pain Location: abdomen, with reflux Pain Descriptors / Indicators: Sore;Discomfort;Sharp Pain Intervention(s): Limited activity within patient's tolerance;Monitored during session;Repositioned    Home Living  Prior Function            PT Goals (current goals can now be found in the care plan section) Acute Rehab PT Goals Patient Stated Goal: return to independence PT Goal Formulation: With patient Time For Goal Achievement: 12/19/18 Potential to Achieve Goals: Good Progress towards PT  goals: Progressing toward goals    Frequency    Min 3X/week      PT Plan Equipment recommendations need to be updated;Current plan remains appropriate    Co-evaluation              AM-PAC PT "6 Clicks" Mobility   Outcome Measure  Help needed turning from your back to your side while in a flat bed without using bedrails?: A Little Help needed moving from lying on your back to sitting on the side of a flat bed without using bedrails?: None Help needed moving to and from a bed to a chair (including a wheelchair)?: None Help needed standing up from a chair using your arms (e.g., wheelchair or bedside chair)?: None Help needed to walk in hospital room?: A Little Help needed climbing 3-5 steps with a railing? : A Little 6 Click Score: 21    End of Session Equipment Utilized During Treatment: Gait belt Activity Tolerance: Patient tolerated treatment well;Patient limited by pain Patient left: with call bell/phone within reach;in chair Nurse Communication: Mobility status PT Visit Diagnosis: Other abnormalities of gait and mobility (R26.89);Pain Pain - Right/Left: (general) Pain - part of body: (abdomen)     Time: 2094-7096 PT Time Calculation (min) (ACUTE ONLY): 17 min  Charges:  $Gait Training: 8-22 mins                     Julien Girt, PT Acute Rehabilitation Services Pager [phone]  Office [phone]     D Elonda Husky 12/08/2018, 1:56 PM

## 2018-12-09 DIAGNOSIS — R1310 Dysphagia, unspecified: Secondary | ICD-10-CM

## 2018-12-09 MED ORDER — ACETAMINOPHEN 500 MG PO TABS
1000.0000 mg | ORAL_TABLET | Freq: Three times a day (TID) | ORAL | Status: DC
  Administered 2018-12-09 – 2018-12-10 (×4): 1000 mg via ORAL
  Filled 2018-12-09 (×4): qty 2

## 2018-12-09 MED ORDER — MAGIC MOUTHWASH
15.0000 mL | Freq: Four times a day (QID) | ORAL | Status: DC | PRN
  Filled 2018-12-09: qty 15

## 2018-12-09 MED ORDER — PANTOPRAZOLE SODIUM 40 MG PO TBEC
80.0000 mg | DELAYED_RELEASE_TABLET | Freq: Every day | ORAL | Status: DC
  Administered 2018-12-09 – 2018-12-10 (×2): 80 mg via ORAL
  Filled 2018-12-09 (×2): qty 2

## 2018-12-09 MED ORDER — SODIUM CHLORIDE 0.9 % IV SOLN
500.0000 mg | Freq: Once | INTRAVENOUS | Status: AC
  Administered 2018-12-09: 500 mg via INTRAVENOUS
  Filled 2018-12-09: qty 10

## 2018-12-09 MED ORDER — LIP MEDEX EX OINT
1.0000 "application " | TOPICAL_OINTMENT | Freq: Two times a day (BID) | CUTANEOUS | Status: DC
  Administered 2018-12-09 – 2018-12-10 (×3): 1 via TOPICAL
  Filled 2018-12-09: qty 7

## 2018-12-09 MED ORDER — GABAPENTIN 100 MG PO CAPS
200.0000 mg | ORAL_CAPSULE | Freq: Three times a day (TID) | ORAL | Status: DC
  Filled 2018-12-09 (×2): qty 2

## 2018-12-09 MED ORDER — POLYETHYLENE GLYCOL 3350 17 G PO PACK
34.0000 g | PACK | Freq: Once | ORAL | Status: AC
  Administered 2018-12-09: 34 g via ORAL
  Filled 2018-12-09: qty 2

## 2018-12-09 MED ORDER — BISACODYL 10 MG RE SUPP: 10.0000 mg | Freq: Once | RECTAL | Status: DC

## 2018-12-09 MED ORDER — BISACODYL 10 MG RE SUPP
10.0000 mg | Freq: Every day | RECTAL | Status: DC
  Administered 2018-12-09: 10 mg via RECTAL
  Filled 2018-12-09: qty 1

## 2018-12-09 MED ORDER — BISACODYL 10 MG RE SUPP: 10.0000 mg | Freq: Two times a day (BID) | RECTAL | Status: DC | PRN

## 2018-12-09 MED ORDER — FENTANYL CITRATE (PF) 100 MCG/2ML IJ SOLN: 12.5000 ug | INTRAMUSCULAR | Status: DC | PRN

## 2018-12-09 MED ORDER — ENSURE SURGERY PO LIQD
237.0000 mL | Freq: Two times a day (BID) | ORAL | Status: DC
  Filled 2018-12-09 (×4): qty 237

## 2018-12-09 MED ORDER — SODIUM CHLORIDE 0.9 % IV SOLN
25.0000 mg | Freq: Once | INTRAVENOUS | Status: AC
  Administered 2018-12-09: 25 mg via INTRAVENOUS
  Filled 2018-12-09: qty 0.5

## 2018-12-09 MED ORDER — ALUM & MAG HYDROXIDE-SIMETH 200-200-20 MG/5ML PO SUSP: 30.0000 mL | Freq: Four times a day (QID) | ORAL | Status: DC | PRN

## 2018-12-09 MED ORDER — POLYETHYLENE GLYCOL 3350 17 G PO PACK
17.0000 g | PACK | Freq: Two times a day (BID) | ORAL | Status: DC
  Filled 2018-12-09: qty 1

## 2018-12-09 NOTE — Progress Notes (Addendum)
8 Days Post-Op  Subjective: CC:  Patient with nausea and retching.  Denies large-volume emesis.  Still feels constipated.  Had small liquidy passage and flatus but no regular bowel movement in 2 weeks.  Wants to only drink liquids.  Felt dysphasia with solid soft food.  Walk around and feeling fine.  Some pain but controlled.  Leaning towards going home but admits worried she cannot eat or drink much   Objective: Vital signs in last 24 hours: Temp:  [99 F (37.2 C)-99.1 F (37.3 C)] 99 F (37.2 C) (08/15 0516) Pulse Rate:  [69-87] 69 (08/15 0516) Resp:  [13-20] 18 (08/15 0516) BP: (114-120)/(63-82) 119/68 (08/15 0516) SpO2:  [98 %-100 %] 100 % (08/15 0516) Last BM Date: 12/01/18  Intake/Output from previous day: 08/14 0701 - 08/15 0700 In: 985.8 [P.O.:720; I.V.:265.8] Out: 800 [Urine:800] Intake/Output this shift: No intake/output data recorded.  PE: Gen:  Alert, elderly female in NAD.  Not toxic or sickly.  Chatty and interactive. Card:  RRR Pulm:  CTA b/l, normal rate and effort  Abd: Soft, ND, NT, +BS, Incisions with glue intact appears well and are without drainage, bleeding, or signs of infection Ext:  No edema to b/l LE's Skin: no rashes noted, warm and dry  Lab Results:  No results for input(s): WBC, HGB, HCT, PLT in the last 72 hours. BMET No results for input(s): NA, K, CL, CO2, GLUCOSE, BUN, CREATININE, CALCIUM in the last 72 hours. PT/INR No results for input(s): LABPROT, INR in the last 72 hours. CMP     Component Value Date/Time   NA 133 (L) 12/06/2018 0359   NA 141 06/08/2018 1513   K 4.0 12/06/2018 0359   CL 98 12/06/2018 0359   CO2 24 12/06/2018 0359   GLUCOSE 109 (H) 12/06/2018 0359   BUN 8 12/06/2018 0359   BUN 17 06/08/2018 1513   CREATININE 0.91 12/06/2018 0359   CREATININE 0.83 04/10/2014 1426   CALCIUM 8.9 12/06/2018 0359   PROT 7.1 12/06/2018 0359   PROT 7.1 12/16/2016 1407   ALBUMIN 3.6 12/06/2018 0359   ALBUMIN 4.4  12/16/2016 1407   AST 22 12/06/2018 0359   ALT 24 12/06/2018 0359   ALKPHOS 44 12/06/2018 0359   BILITOT 1.7 (H) 12/06/2018 0359   BILITOT 0.2 12/16/2016 1407   GFRNONAA 58 (L) 12/06/2018 0359   GFRNONAA 67 04/10/2014 1426   GFRAA >60 12/06/2018 0359   GFRAA 78 04/10/2014 1426   Lipase     Component Value Date/Time   LIPASE 26 02/21/2013 2140       Studies/Results: No results found.  Anti-infectives: Anti-infectives (From admission, onward)   Start     Dose/Rate Route Frequency Ordered Stop   12/01/18 1800  ceFAZolin (ANCEF) IVPB 2g/100 mL premix     2 g 200 mL/hr over 30 Minutes Intravenous Every 8 hours 12/01/18 1425 12/01/18 1829   12/01/18 0730  ceFAZolin (ANCEF) IVPB 2g/100 mL premix     2 g 200 mL/hr over 30 Minutes Intravenous On call to O.R. 12/01/18 0725 12/01/18 0955      Assessment/Plan Active Problems:   Hyperlipidemia   Anxiety and depression   HTN (hypertension)   GERD (gastroesophageal reflux disease)   S/P Nissen fundoplication (without gastrostomy tube) procedure   Osteoporosis   Chronic bronchitis (HCC)   Iron deficiency anemia due to chronic blood loss   Barrett's esophagus   Hiatal hernia with GERD   Dysphagia    S/P Laparoscopic takedown hiatal  hernia with reduction of Nissen to 270 degree; endoscopy - Dr. Daphine DeutscherMartin - 8/7 - POD #7  - PPI daily 80mg  -With complaints of severe dysphasia and retching, go back down to dysphagia 1 diet.  Speech therapy evaluation to see if patient would benefit from modified barium swallow.  I wonder if she has a component of pill dysphasia since she refuses to take her Plavix and does not seem to like to take pills.  Make sure that is not an issue. - Standing Tylenol and increase gabapentin for her pain. -  IV iron for postoperative on top of chronic iron deficiency anemia.  Suspect she will not tolerate iron with constipation already -Hypertension control. -Asthma and reactive artery disease.  Some right-sided  atelectasis but no definite pneumonia.  Stable on room air.  Inhalers PRN - Mobilize and IS  D/C patient from hospital when patient meets criteria (anticipate in 1-3 day(s)):  Tolerating oral intake well Ambulating well Adequate pain control without IV medications Urinating  Having flatus Disposition planning in place  - PT recommended HH. Ordered  - Home Saturday   FEN - Soft VTE - SCDs, Mobilize, Heparin and Plavix. Patient refusing Heparin and Plavix. We discussed the risks of not taking Heparin and Plavix especially with her hx of DVT ID - Ancef periop   LOS: 8 days    Ardeth SportsmanSteven C  , Ardeth SportsmanSteven C. , MD, FACS, MASCRS Gastrointestinal and Minimally Invasive Surgery    [address] [place], [place] [id number] [phone] Main / Paging [phone] Fax

## 2018-12-09 NOTE — Progress Notes (Signed)
Patient refused heparin and SCDs, patient was educated on purpose of heparin and SCDs. All questions answered from patient.  Norlene Duel RN, BSN

## 2018-12-09 NOTE — Evaluation (Signed)
Clinical/Bedside Swallow Evaluation Patient Details  Name: Sherri Mcmillan MRN: [id number] Date of Birth: [birth year]  Today's Date: 12/09/2018 Time: SLP Start Time (ACUTE ONLY): 1510 SLP Stop Time (ACUTE ONLY): 1528 SLP Time Calculation (min) (ACUTE ONLY): 18 min  Past Medical History:  Past Medical History:  Diagnosis Date  . Anemia 12/14/2012  . Ataxia 06/24/2016  . Auditory hallucinations 08/19/2010   Has has auditory hallucinations, which seem to have worsened since death of her sister Sep 2011. [facility] early [facility])  for 6 weeks after mother died, also with hallucinations.  B/C of severe mental illness and refusal to see pysch, I have refused to refill controlled meds. If she decides to pursue mental health, then she needs to take the initiative and sch the appt bc [name] has s  . Barrett's esophagus    Demonstrated on EGD 12/2010. EGD 09/17/2012 shows inflamed GE junctional mucosa without metaplasia, dysplasia, or malignancy.  . Cataract   . Chronic anxiety    [facility]  for 6 weeks after mother died. Complicated again by death of her sister 2010. 2012 developed hallucinations and I refused to refill controlled meds unless she see psych which she is not agreable to  . Chronic insomnia   . Depression   . DVT (deep venous thrombosis) (HCC)   . Gastroparesis    Demonstrated on GES 12/2010 by Dr Dalene SeltzerKapland  . GERD (gastroesophageal reflux disease)    describes " I sometimes have a hard time talking or swallowing" - relative to the reflux  . H/O hiatal hernia   . Hiatal hernia   . HLD (hyperlipidemia) 01/2010  . HTN (hypertension)    Controlled with 2 drug therapy  . Insomnia 07/27/2006  . Left leg DVT (HCC) 09/28/2012  . Migraine   . Pulmonary nodule 02/10/2014   CT 06/2015 shows stable pulm nodules compared to prior study 2015, so likely benign.     . 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 Severe aortic stenosis    Past Surgical History:  Past Surgical History:  Procedure Laterality Date  . 24 HOUR PH STUDY N/A 08/24/2017   Procedure: 24 HOUR PH STUDY IMPEDANCE;  Surgeon: Sherrilyn Ristanis, Henry L III, MD;  Location: [facility];  Service: Gastroenterology;  Laterality: N/A;  . ABDOMINAL HYSTERECTOMY    . APPENDECTOMY    . CARDIAC CATHETERIZATION    . CATARACT EXTRACTION     left eye  . COLONOSCOPY  2000&2005   Dr.Magod  . DIRECT LARYNGOSCOPY   September 2008    preoperative diagnosis hoarseness with anterior right vocal cord lesion -  direct laryngoscopy and excisional biopsy of right anterior vocal cord lesion done by Dr. Ezzard StandingNewman  . ENDOVENOUS ABLATION SAPHENOUS VEIN W/ LASER Left 05-09-2014   EVLA left small saphenous vein by Gretta Beganodd Early MD  . ESOPHAGEAL MANOMETRY N/A 08/24/2017   Procedure: ESOPHAGEAL MANOMETRY (EM);  Surgeon: Sherrilyn Ristanis, Henry L III, MD;  Location: [facility];  Service: Gastroenterology;  Laterality: N/A;  . ESOPHAGOGASTRODUODENOSCOPY  11/2010  . ESOPHAGOGASTRODUODENOSCOPY N/A 09/17/2012   Procedure: ESOPHAGOGASTRODUODENOSCOPY (EGD);  Surgeon: Hart Carwinora M Brodie, MD;  [facility];  Service: Endoscopy;  Laterality: N/A;  . ESOPHAGOGASTRODUODENOSCOPY N/A 12/17/2012   Procedure: ESOPHAGOGASTRODUODENOSCOPY (EGD);  Surgeon: Beverley FiedlerJay M Pyrtle, MD;  [facility];  Service: Gastroenterology;  Laterality: N/A;  . EXCISION MASS HEAD N/A  03/05/2016   Procedure: EXCISION POSTERIOR SCALP MASS;  Surgeon: Axel FillerArmando Ramirez, MD;  Location: [facility] OR;  Service: General;  Laterality: N/A;  . EYE SURGERY Bilateral    cataracts removed-   . HEMORRHOID SURGERY    . HIATAL HERNIA REPAIR N/A 12/01/2018   Procedure: LAPAROSCOPIC TAKEDOWN HIATAL HERNIA NISEEN REDUCTION AND UPPER ENDOSCOPY;  Surgeon: Luretha MurphyMartin, Matthew, MD;  Location: [facility] ORS;  Service: General;  Laterality: N/A;  . KNEE ARTHROSCOPY    . LAPAROSCOPIC NISSEN FUNDOPLICATION N/A 05/02/2013   Procedure:  LAPAROSCOPIC NISSEN FUNDOPLICATION;  Surgeon: Valarie MerinoMatthew B Martin, MD;  Location: [facility]S;  Service: General;  Laterality: N/A;  . MENISCECTOMY   July 2002    preoperative diagnosis torn medial meniscus right knee, partial medial meniscectomy, debridement chondroplasty patellofemoral joint, done by Dr. Madelon Lipsaffrey  . POLYPECTOMY  2000   Dr.Magod  . RIGHT/LEFT HEART CATH AND CORONARY ANGIOGRAPHY N/A 04/06/2018   Procedure: RIGHT/LEFT HEART CATH AND CORONARY ANGIOGRAPHY;  Surgeon: Kathleene HazelMcAlhany, Christopher D, MD;  Location: [facility];  Service: Cardiovascular;  Laterality: N/A;  . ROTATOR CUFF REPAIR  09/21/2011   rt shoulder  . TEE WITHOUT CARDIOVERSION N/A 05/30/2018   Procedure: TRANSESOPHAGEAL ECHOCARDIOGRAM (TEE);  Surgeon: Kathleene HazelMcAlhany, Christopher D, MD;  [facility];  Service: Open Heart Surgery;  Laterality: N/A;  . TOOTH EXTRACTION Bilateral 04/05/2018   Procedure: Extraction of tooth #'s 6 and 29 with alveoloplasty and gross debridement of remaining teeth;  Surgeon: Charlynne PanderKulinski, Ronald F, DDS;  Location: [facility] OR;  Service: Oral Surgery;  Laterality: Bilateral;  . TRANSCATHETER AORTIC VALVE REPLACEMENT, TRANSFEMORAL N/A 05/30/2018   Procedure: TRANSCATHETER AORTIC VALVE REPLACEMENT, TRANSFEMORAL - MEDTRONIC VALVE;  Surgeon: Kathleene HazelMcAlhany, Christopher D, MD;  Location: [facility] OR;  Service: Open Heart Surgery;  Laterality: N/A;   HPI:  The patient is a 85 year old AA female who presents with a hiatal hernia. She had a prior laparoscopic Nissen fundoplication by me and Dr. Rayburn MaBlackmon in January 2015.  She had a 3 suture posterior repair of her hiatus with time knots and a Nissen wrap around a 56 dilator.  About 2 years ago she began having reflux symptoms and these are gotten much worse.  She was referred by Dr. Myrtie Neitheranis.  She is now 85 years old and is hoarse and is feeling like she can't go on like this.  She is willing to take the risk and recognizes she is at significant risk.  I want to get an upper GI series and  then schedule her for laparoscopic takedown and repair were hiatus possible gastropexy and G-tube placement.  Most recent chest xray is showing slight improvement in right basilar infiltrate.     Assessment / Plan / Recommendation Clinical Impression  Clinical swallowing evaluation was completed using thin liquids via spoon and cup, and pureed material.  The patient endorsed trouble swallowing for the last approximately 2 years that was most consistent with esophageal dysphagia.  She complained of material not wanting to go down, of material coming back up and chest pain with PO intake.  She reported what sounded to be an EGD and Regular Barium Swallow approximately 2 years ago with no significant findings.  Cranial nerve exam was completed and unremarkable.  Lingual, labial, jaw and facial range of motion and strength were adequate.  Sensation was intact.  The patient's oral and pharyngeal swallow appeared to be functional.  She was clearly very uncomfortable with intake and was observed to belch and grab her chest at times.  She eventually spit out clear, frothy secretions.  Suspect an esophageal dysphagia.  MD please consider GI work up to assess esophageal function.  ST will follow briefly for patient education.     SLP Visit Diagnosis: Dysphagia, unspecified (R13.10)    Aspiration Risk  Mild aspiration risk    Diet Recommendation   Regular as tolerated by patient and thin liquids.    Medication Administration: Whole meds with liquid    Other  Recommendations Recommended Consults: Consider GI evaluation;Consider esophageal assessment Oral Care Recommendations: Oral care BID   Follow up Recommendations None      Frequency and Duration min 2x/week  2 weeks       Prognosis        Swallow Study   General Date of Onset: 12/01/18 HPI: The patient is a 85 year old AA female who presents with a hiatal hernia. She had a prior laparoscopic Nissen fundoplication by me and Dr. Rush Farmer in  January 2015.  She had a 3 suture posterior repair of her hiatus with time knots and a Nissen wrap around a 56 dilator.  About 2 years ago she began having reflux symptoms and these are gotten much worse.  She was referred by Dr. Loletha Carrow.  She is now 85 years old and is hoarse and is feeling like she can't go on like this.  She is willing to take the risk and recognizes she is at significant risk.  I want to get an upper GI series and then schedule her for laparoscopic takedown and repair were hiatus possible gastropexy and G-tube placement.  Most recent chest xray is showing slight improvement in right basilar infiltrate.   Type of Study: Bedside Swallow Evaluation Previous Swallow Assessment: [facility] noted at [facility]. Diet Prior to this Study: Dysphagia 3 (soft);Thin liquids Temperature Spikes Noted: No History of Recent Intubation: No Behavior/Cognition: Alert;Cooperative;Pleasant mood Oral Cavity Assessment: Within Functional Limits Oral Care Completed by SLP: No Oral Cavity - Dentition: Adequate natural dentition;Missing dentition Vision: Functional for self-feeding Self-Feeding Abilities: Able to feed self Patient Positioning: Upright in bed Baseline Vocal Quality: Normal Volitional Swallow: Able to elicit    Oral/Motor/Sensory Function Overall Oral Motor/Sensory Function: Within functional limits   Ice Chips Ice chips: Not tested   Thin Liquid Thin Liquid: Within functional limits Presentation: Cup;Spoon;Self Fed    Nectar Thick Nectar Thick Liquid: Not tested   Honey Thick Honey Thick Liquid: Not tested   Puree Puree: Within functional limits Presentation: Spoon;Self Fed   Solid     Solid: Not tested     Shelly Flatten, MA, [place] [facility] SLP [phone]  Lamar Sprinkles 12/09/2018,3:40 PM

## 2018-12-10 DIAGNOSIS — F419 Anxiety disorder, unspecified: Secondary | ICD-10-CM | POA: Diagnosis present

## 2018-12-10 DIAGNOSIS — F5104 Psychophysiologic insomnia: Secondary | ICD-10-CM | POA: Diagnosis present

## 2018-12-10 LAB — BASIC METABOLIC PANEL
Anion gap: 7 (ref 5–15)
BUN: 13 mg/dL (ref 8–23)
CO2: 27 mmol/L (ref 22–32)
Calcium: 9 mg/dL (ref 8.9–10.3)
Chloride: 103 mmol/L (ref 98–111)
Creatinine, Ser: 0.81 mg/dL (ref 0.44–1.00)
GFR calc Af Amer: >60 mL/min (ref >60)
GFR calc non Af Amer: >60 mL/min (ref >60)
Glucose, Bld: 92 mg/dL (ref 70–99)
Potassium: 3.9 mmol/L (ref 3.5–5.1)
Sodium: 137 mmol/L (ref 135–145)

## 2018-12-10 LAB — CBC
HCT: 24.1 % — ABNORMAL LOW (ref 36.0–46.0)
Hemoglobin: 7.5 g/dL — ABNORMAL LOW (ref 12.0–15.0)
MCH: 29 pg (ref 26.0–34.0)
MCHC: 31.1 g/dL (ref 30.0–36.0)
MCV: 93.1 fL (ref 80.0–100.0)
Platelets: 293 10*3/uL (ref 150–400)
RBC: 2.59 MIL/uL — ABNORMAL LOW (ref 3.87–5.11)
RDW: 13.7 % (ref 11.5–15.5)
WBC: 4.6 10*3/uL (ref 4.0–10.5)
nRBC: 0 % (ref 0.0–0.2)

## 2018-12-10 MED ORDER — OXYCODONE HCL 5 MG PO TABS: 5.0000 mg | ORAL_TABLET | Freq: Four times a day (QID) | ORAL | 0 refills | Status: DC | PRN

## 2018-12-10 MED ORDER — ONDANSETRON 4 MG PO TBDP: 4.0000 mg | ORAL_TABLET | Freq: Three times a day (TID) | ORAL | 5 refills | Status: DC | PRN

## 2018-12-10 NOTE — Discharge Instructions (Signed)
EATING AFTER YOUR ESOPHAGEAL SURGERY °(Stomach Fundoplication, Hiatal Hernia repair, Achalasia surgery, etc) ° °###################################################################### ° °EAT °Start with a pureed / full liquid diet (see below) °Gradually transition to a high fiber diet with a fiber supplement over the next month after discharge.   ° °WALK °Walk an hour a day.  Control your pain to do that.   ° °CONTROL PAIN °Control pain so that you can walk, sleep, tolerate sneezing/coughing, go up/down stairs. ° °HAVE A BOWEL MOVEMENT DAILY °Keep your bowels regular to avoid problems.  OK to try a laxative to override constipation.  OK to use an antidairrheal to slow down diarrhea.  Call if not better after 2 tries ° °CALL IF YOU HAVE PROBLEMS/CONCERNS °Call if you are still struggling despite following these instructions. °Call if you have concerns not answered by these instructions ° °###################################################################### ° ° °After your esophageal surgery, expect some sticking with swallowing over the next 1-2 months.   ° °If food sticks when you eat, it is called "dysphagia".  This is due to swelling around your esophagus at the wrap & hiatal diaphragm repair.  It will gradually ease off over the next few months.  To help you through this temporary phase, we start you out on a pureed (blenderized) diet. ° °Your first meal in the hospital was thin liquids.  You should have been given a pureed diet by the time you left the hospital.  We ask patients to stay on a pureed diet for the first 2-3 weeks to avoid anything getting "stuck" near your recent surgery.  Don't be alarmed if your ability to swallow doesn't progress according to this plan.  Everyone is different and some diets can advance more or less quickly.   ° ° °Some BASIC RULES to follow are: °· Maintain an upright position whenever eating or drinking. °· Take small bites - just a teaspoon size bite at a time. °· Eat slowly.   It may also help to eat only one food at a time. °· Consider nibbling through smaller, more frequent meals & avoid the urge to eat BIG meals °· Do not push through feelings of fullness, nausea, or bloatedness °· Do not mix solid foods and liquids in the same mouthful °· Try not to "wash foods down" with large gulps of liquids. °· Avoid carbonated (bubbly/fizzy) drinks.   °· Avoid foods that make you feel gassy or bloated.  Start with bland foods first.  Wait on trying greasy, fried, or spicy meals until you are tolerating more bland solids well. °· Understand that it will be hard to burp and belch at first.  This gradually improves with time.  Expect to be more gassy/flatulent/bloated initially.  Walking will help your body manage it better. °· Consider using medications for bloating that contain simethicone such as  Maalox or Gas-X  °· Eat in a relaxed atmosphere & minimize distractions. °· Avoid talking while eating.   °· Do not use straws. °· Following each meal, sit in an upright position (90 degree angle) for 60 to 90 minutes.  Going for a short walk can help as well °· If food does stick, don't panic.  Try to relax and let the food pass on its own.  Sipping WARM LIQUID such as strong hot black tea can also help slide it down. ° ° °Be gradual in changes & use common sense: ° °-If you easily tolerating a certain "level" of foods, advance to the next level gradually °-If you are   having trouble swallowing a particular food, then avoid it.   -If food is sticking when you advance your diet, go back to thinner previous diet (the lower LEVEL) for 1-2 days.  LEVEL 1 = PUREED DIET  Do for the first 2 WEEKS AFTER SURGERY  -Foods in this group are pureed or blenderized to a smooth, mashed potato-like consistency.  -If necessary, the pureed foods can keep their shape with the addition of a thickening agent.   -Meat should be pureed to a smooth, pasty consistency.  Hot broth or gravy may be added to the pureed  meat, approximately 1 oz. of liquid per 3 oz. serving of meat. -CAUTION:  If any foods do not puree into a smooth consistency, swallowing will be more difficult.  (For example, nuts or seeds sometimes do not blend well.)  Hot Foods Cold Foods  Pureed scrambled eggs and cheese Pureed cottage cheese  Baby cereals Thickened juices and nectars  Thinned cooked cereals (no lumps) Thickened milk or eggnog  Pureed [place] toast or pancakes Ensure  Mashed potatoes Ice cream  Pureed parsley, au gratin, scalloped potatoes, candied sweet potatoes Fruit or [place] ice, sherbet  Pureed buttered or alfredo noodles Plain yogurt  Pureed vegetables (no corn or peas) Instant breakfast  Pureed soups and creamed soups Smooth pudding, mousse, custard  Pureed scalloped apples Whipped gelatin  Gravies Sugar, syrup, honey, jelly  Sauces, cheese, tomato, barbecue, white, creamed Cream  Any baby food Creamer  Alcohol in moderation (not beer or champagne) Margarine  Coffee or tea Mayonnaise   Ketchup, mustard   Apple sauce   SAMPLE MENU:  PUREED DIET Breakfast Lunch Dinner   Orange juice, 1/2 cup  Cream of wheat, 1/2 cup  Pineapple juice, 1/2 cup  Pureed [place], barley soup, 3/4 cup  Pureed Hawaiian chicken, 3 oz   Scrambled eggs, mashed or blended with cheese, 1/2 cup  Tea or coffee, 1 cup   Whole milk, 1 cup   Non-dairy creamer, 2 Tbsp.  Mashed potatoes, 1/2 cup  Pureed cooled broccoli, 1/2 cup  Apple sauce, 1/2 cup  Coffee or tea  Mashed potatoes, 1/2 cup  Pureed spinach, 1/2 cup  Frozen yogurt, 1/2 cup  Tea or coffee      LEVEL 2 = SOFT DIET  After your first 2 weeks, you can advance to a soft diet.   Keep on this diet until everything goes down easily.  Hot Foods Cold Foods  White fish Cottage cheese  Stuffed fish Junior baby fruit  Baby food meals Semi thickened juices  Minced soft cooked, scrambled, poached eggs nectars  Souffle & omelets Ripe mashed bananas  Cooked  cereals Canned fruit, pineapple sauce, milk  potatoes Milkshake  Buttered or Alfredo noodles Custard  Cooked cooled vegetable Puddings, including tapioca  Sherbet Yogurt  Vegetable soup or alphabet soup Fruit ice, [place] ice  Gravies Whipped gelatin  Sugar, syrup, honey, jelly Junior baby desserts  Sauces:  Cheese, creamed, barbecue, tomato, white Cream  Coffee or tea Margarine   SAMPLE MENU:  LEVEL 2 Breakfast Lunch Dinner   Orange juice, 1/2 cup  Oatmeal, 1/2 cup  Scrambled eggs with cheese, 1/2 cup  Decaffeinated tea, 1 cup  Whole milk, 1 cup  Non-dairy creamer, 2 Tbsp  Pineapple juice, 1/2 cup  Minced beef, 3 oz  Gravy, 2 Tbsp  Mashed potatoes, 1/2 cup  Minced fresh broccoli, 1/2 cup  Applesauce, 1/2 cup  Coffee, 1 cup  [place], barley soup, 3/4 cup  Minced Hawaiian chicken, 3 oz °· Mashed potatoes, 1/2 cup °· Cooked spinach, 1/2 cup °· Frozen yogurt, 1/2 cup °· Non-dairy creamer, 2 Tbsp  ° ° ° ° °LEVEL 3 = CHOPPED DIET ° °-After all the foods in level 2 (soft diet) are passing through well you should advance up to more chopped foods.  °-It is still important to cut these foods into small pieces and eat slowly. ° °Hot Foods Cold Foods  °Poultry Cottage cheese  °Chopped Swedish meatballs Yogurt  °Meat salads (ground or flaked meat) Milk  °Flaked fish (tuna) Milkshakes  °Poached or scrambled eggs Soft, cold, dry cereal  °Souffles and omelets Fruit juices or nectars  °Cooked cereals Chopped canned fruit  °Chopped French toast or pancakes Canned fruit cocktail  °Noodles or pasta (no rice) Pudding, mousse, custard  °Cooked vegetables (no frozen peas, corn, or mixed vegetables) Green salad  °Canned small sweet peas Ice cream  °Creamed soup or vegetable soup Fruit ice, Italian ice  °Pureed vegetable soup or alphabet soup Non-dairy creamer  °Ground scalloped apples Margarine  °Gravies Mayonnaise  °Sauces:  Cheese, creamed, barbecue, tomato, white Ketchup  °Coffee or tea Mustard   ° °SAMPLE MENU:  LEVEL 3 °Breakfast Lunch Dinner  °· Orange juice, 1/2 cup °· Oatmeal, 1/2 cup °· Scrambled eggs with cheese, 1/2 cup °· Decaffeinated tea, 1 cup °· Whole milk, 1 cup °· Non-dairy creamer, 2 Tbsp °· Ketchup, 1 Tbsp °· Margarine, 1 tsp °· Salt, 1/4 tsp °· Sugar, 2 tsp · Pineapple juice, 1/2 cup °· Ground beef, 3 oz °· Gravy, 2 Tbsp °· Mashed potatoes, 1/2 cup °· Cooked spinach, 1/2 cup °· Applesauce, 1/2 cup °· Decaffeinated coffee °· Whole milk °· Non-dairy creamer, 2 Tbsp °· Margarine, 1 tsp °· Salt, 1/4 tsp · Pureed turkey, barley soup, 3/4 cup °· Barbecue chicken, 3 oz °· Mashed potatoes, 1/2 cup °· Ground fresh broccoli, 1/2 cup °· Frozen yogurt, 1/2 cup °· Decaffeinated tea, 1 cup °· Non-dairy creamer, 2 Tbsp °· Margarine, 1 tsp °· Salt, 1/4 tsp °· Sugar, 1 tsp  ° ° °LEVEL 4:  REGULAR FOODS ° °-Foods in this group are soft, moist, regularly textured foods.   °-This level includes meat and breads, which tend to be the hardest things to swallow.   °-Eat very slowly, chew well and continue to avoid carbonated drinks. °-most people are at this level in 4-6 weeks ° °Hot Foods Cold Foods  °Baked fish or skinned Soft cheeses - cottage cheese  °Souffles and omelets Cream cheese  °Eggs Yogurt  °Stuffed shells Milk  °Spaghetti with meat sauce Milkshakes  °Cooked cereal Cold dry cereals (no nuts, dried fruit, coconut)  °French toast or pancakes Crackers  °Buttered toast Fruit juices or nectars  °Noodles or pasta (no rice) Canned fruit  °Potatoes (all types) Ripe bananas  °Soft, cooked vegetables (no corn, lima, or baked beans) Peeled, ripe, fresh fruit  °Creamed soups or vegetable soup Cakes (no nuts, dried fruit, coconut)  °Canned chicken noodle soup Plain doughnuts  °Gravies Ice cream  °Bacon dressing Pudding, mousse, custard  °Sauces:  Cheese, creamed, barbecue, tomato, white Fruit ice, Italian ice, sherbet  °Decaffeinated tea or coffee Whipped gelatin  °Pork chops Regular gelatin  ° Canned fruited  gelatin molds  ° Sugar, syrup, honey, jam, jelly  ° Cream  ° Non-dairy  ° Margarine  ° Oil  ° Mayonnaise  ° Ketchup  ° Mustard  ° °TROUBLESHOOTING IRREGULAR BOWELS  °1) Avoid extremes of bowel   movements (no bad constipation/diarrhea)  2) Miralax 17gm mixed in 8oz. water or juice-daily. May use BID as needed.  3) Gas-x,Phazyme, etc. as needed for gas & bloating.  4) Soft,bland diet. No spicy,greasy,fried foods.  5) Prilosec over-the-counter as needed  6) May hold gluten/wheat products from diet to see if symptoms improve.  7) May try probiotics (Align, Activa, etc) to help calm the bowels down  7) If symptoms become worse call back immediately.    If you have any questions please call our office at [facility]: [phone].    Dysphagia  Dysphagia is trouble swallowing. This condition occurs when solids and liquids stick in a person's throat on the way down to the stomach, or when food takes longer to get to the stomach. You may have problems swallowing food, liquids, or both. You may also have pain while trying to swallow. It may take you more time and effort to swallow something. What are the causes? This condition is caused by:  Problems with the muscles. They may make it difficult for you to move food and liquids through the tube that connects your mouth to your stomach (esophagus). You may have ulcers, scar tissue, or inflammation that blocks the normal passage of food and liquids. Causes of these problems include: ? Acid reflux from your stomach into your esophagus (gastroesophageal reflux). ? Infections. ? Radiation treatment for cancer. ? Medicines taken without enough fluids to wash them down into your stomach.  Nerve problems. These prevent signals from being sent to the muscles of your esophagus to squeeze (contract) and move what you swallow down to your stomach.  Globus pharyngeus. This is a common problem that involves feeling like something is stuck in the  throat or a sense of trouble with swallowing even though nothing is wrong with the swallowing passages.  Stroke. This can affect the nerves and make it difficult to swallow.  Certain conditions, such as cerebral palsy or Parkinson disease. What are the signs or symptoms? Common symptoms of this condition include:  A feeling that solids or liquids are stuck in your throat on the way down to the stomach.  Food taking too long to get to the stomach. Other symptoms include:  Food moving back from your stomach to your mouth (regurgitation).  Noises coming from your throat.  Chest discomfort with swallowing.  A feeling of fullness when swallowing.  Drooling, especially when the throat is blocked.  Pain while swallowing.  Heartburn.  Coughing or gagging while trying to swallow. How is this diagnosed? This condition is diagnosed by:  Barium X-ray. In this test, you swallow a white substance (contrast medium)that sticks to the inside of your esophagus. X-ray images are then taken.  Endoscopy. In this test, a flexible telescope is inserted down your throat to look at your esophagus and your stomach.  CT scans and MRI. How is this treated? Treatment for dysphagia depends on the cause of the condition:  If the dysphagia is caused by acid reflux or infection, medicines may be used. They may include antibiotics and heartburn medicines.  If the dysphagia is caused by problems with your muscles, swallowing therapy may be used to help you strengthen your swallowing muscles. You may have to do specific exercises to strengthen the muscles or stretch them.  If the dysphagia is caused by a blockage or mass, procedures to remove the blockage may be done. You may need surgery and a feeding tube. You may need to make diet changes. Ask  your health care provider for specific instructions. Follow these instructions at home: Eating and drinking  Try to eat soft food that is easier to  swallow.  Follow any diet changes as told by your health care provider.  Cut your food into small pieces and eat slowly.  Eat and drink only when you are sitting upright.  Do not drink alcohol or caffeine. If you need help quitting, ask your health care provider. General instructions  Check your weight every day to make sure you are not losing weight.  Take over-the-counter and prescription medicines only as told by your health care provider.  If you were prescribed an antibiotic medicine, take it as told by your health care provider. Do not stop taking the antibiotic even if you start to feel better.  Do not use any products that contain nicotine or tobacco, such as cigarettes and e-cigarettes. If you need help quitting, ask your health care provider.  Keep all follow-up visits as told by your health care provider. This is important. Contact a health care provider if:  You lose weight because you cannot swallow.  You cough when you drink liquids (aspiration).  You cough up partially digested food. Get help right away if:  You cannot swallow your saliva.  You have shortness of breath or a fever, or both.  You have a hoarse voice and also have trouble swallowing. Summary  Dysphagia is trouble swallowing. This condition occurs when solids and liquids stick in a person's throat on the way down to the stomach, or when food takes longer to get to the stomach.  Dysphagia has many possible causes and symptoms.  Treatment for dysphagia depends on the cause of the condition. This information is not intended to replace advice given to you by your health care provider. Make sure you discuss any questions you have with your health care provider. Document Released: 04/09/2000 Document Revised: 03/25/2017 Document Reviewed: 04/01/2016 [employer]vier Patient Education  2020 [employer].

## 2018-12-10 NOTE — Discharge Summary (Addendum)
Physician Discharge Summary    Patient ID: Sherri Mcmillan MRN: [id number] DOB/AGE: 1932/10/09  85 y.o.  Patient Care Team: Levora Dredge, MD as PCP - General (Internal Medicine) Jodelle Red, MD as PCP - Cardiology (Cardiology) Kathleene Hazel, MD as PCP - Structural Heart (Cardiology) Luretha Murphy, MD as Consulting Physician (General Surgery) Sherrilyn Rist, MD as Consulting Physician (Gastroenterology)  Admit date: 12/01/2018  Discharge date: 12/10/2018  Hospital Stay = 9 days    Discharge Diagnoses:  Principal Problem:   S/P Nissen fundoplication (without gastrostomy tube) procedure Active Problems:   Hyperlipidemia   Anxiety and depression   HTN (hypertension)   GERD (gastroesophageal reflux disease)   Osteoporosis   Chronic bronchitis (HCC)   Iron deficiency anemia due to chronic blood loss   Barrett's esophagus   Hiatal hernia with GERD   Dysphagia   History of Auditory Hallucinations   Ataxia   Anemia   Chronic insomnia   Chronic anxiety   HLD (hyperlipidemia)   9 Days Post-Op    12/01/2018  Preop Dx:        Dysphagia after Nissen 2015; hiatal hernia Postop Dx:      same  Procedure:      Laparoscopic takedown hiatal hernia with reduction of Nissen to 270 degree; endoscopy  SURGEON:  Wenda Low MD & Twana First, MD  12/01/2018  Preoperative diagnosis: nissen takedown  Postoperative diagnosis: Same   Procedure: Upper endoscopy   Surgeon: Berna Bue, M.D  Consults: Speech pathology.  Hospital Course:   Patient with GERD and hiatal hernia status post repair in 2015.  Persistent dysphasia with worsening symptoms.  Work-up through gastroenterology and surgery.  Offer made to takedown fundoplication in the hopes of capturing dysphasia.  Underwent laparoscopic lysed lesions with takedown of the hiatal hernia and transition to a partial fundoplication.  Endoscopy showed no leak.    Patient struggled with some  dysphasia and intermittent retching.  Some chest discomfort.  Complains of constipation.  Bowel regimen done with some function.  Given concerns of persistent retching and heartburn, speech therapy consultation made.  No evidence of any significant pill dysphasia or aspiration.  She was transitioned to a more blenderized dysphagia type I diet.  She did have issues of noncompliance with refusing to take any anticoagulation.  He was evaluated by physical therapy.  Recommendation made for home health.  These have been ordered.  Patient seen to be leading towards refusing this.  By the time of discharge, the patient was walking well the hallways, eating at least a dysphagia 1 blenderized food with decreased heartburn and retching.  Having flatus.  Pain was better controlled on an oral medications.  Based on meeting discharge criteria and continuing to recover, I felt it was safe for the patient to be discharged from the hospital to further recover with close followup. Postoperative recommendations were discussed in detail.  They are written as well.  Discharged Condition: fair (but stable for her age)  Discharge Exam: Blood pressure 101/65, pulse 67, temperature 98.3 F (36.8 C), resp. rate 18, height 4\' 11"  (1.499 m), weight 52.7 kg, last menstrual period 04/26/1950, SpO2 100 %.  General: Pt awake/alert/oriented x4 in No acute distress Eyes: PERRL, normal EOM.  Sclera clear.  No icterus Neuro: CN II-XII intact w/o focal sensory/motor deficits. Lymph: No head/neck/groin lymphadenopathy Psych:  No delerium/psychosis/paranoia HENT: Normocephalic, Mucus membranes moist.  No thrush Neck: Supple, No tracheal deviation Chest: No chest wall pain w good excursion  CV:  Pulses intact.  Regular rhythm MS: Normal AROM mjr joints.  No obvious deformity Abdomen: Soft.  Nondistended.  Nontender.  No evidence of peritonitis.  No incarcerated hernias. Ext:  SCDs BLE.  No mjr edema.  No cyanosis Skin: No petechiae /  purpura   Disposition:   Follow-up Information    Luretha Murphy, MD. Schedule an appointment as soon as possible for a visit in 3 week(s).   Specialty: General Surgery Why: Follow-up with your surgeon to make sure your swallowing and heartburn has improved Contact information: [address] [place] [address] [phone]        Levora Dredge, MD Follow up in 1 month(s).   Specialty: Internal Medicine Contact information: [address] [place] [address] [phone]        Jodelle Red, MD .   Specialty: Cardiology Contact information: [address] [place] [address] [phone]        Kathleene Hazel, MD .   Specialty: Cardiology Contact information: [address] [place] [address] [phone]           Discharge disposition: 01-Home or Self Care       Discharge Instructions    Call MD for:   Complete by: As directed    Temperature > 101.60F   Call MD for:  extreme fatigue   Complete by: As directed    Call MD for:  hives   Complete by: As directed    Call MD for:  persistant nausea and vomiting   Complete by: As directed    Call MD for:  redness, tenderness, or signs of infection (pain, swelling, redness, odor or green/yellow discharge around incision site)   Complete by: As directed    Call MD for:  severe uncontrolled pain   Complete by: As directed    Diet general   Complete by: As directed    SEE ESOPHAGEAL SURGERY DIET INSTRUCTIONS  We using usually start you out on a pureed (blenderized) diet. Expect some sticking with swallowing over the next 1-2 months.   This is due to swelling around your esophagus at the wrap & hiatal diaphragm repair.  It will gradually ease off over the next few months.   Discharge instructions   Complete by: As directed    Please see discharge instruction sheets.   Also refer to any handouts/printouts that may have been given from the  [facility] surgery office (if you visited [place] there before surgery) Please call our office if you have any questions or concerns [phone]   Driving Restrictions   Complete by: As directed    No driving until off narcotics and can safely swerve away without pain during an emergency   Increase activity slowly   Complete by: As directed    Lifting restrictions   Complete by: As directed    Avoid heavy lifting initially, <20 pounds at first.   Do not push through pain.   You have no specific weight limit: If it hurts to do, DON'T DO IT.    If you feel no pain, you are not injuring anything.  Pain will protect you from injury.   Coughing and sneezing are far more stressful to your incision than any lifting.   Avoid resuming heavy lifting (>50 pounds) or other intense activity until off all narcotic pain medications.   When want to exercise more, give yourself 2 weeks to gradually get back to full  intense exercise/activity.   May shower / Bathe   Complete by: As directed    [place].  It is fine for dressings or wounds to be washed/rinsed.  Use gentle soap & water.  This will help the incisions and/or wounds get clean & minimize infection.   May walk up steps   Complete by: As directed    Remove dressing in 72 hours   Complete by: As directed    You have closed incisions: Shower and bathe over these incisions with soap and water every day.  It is OK to wash over the dressings: they are waterproof.  You do not need to replace dressings over the closed incisions unless you feel more comfortable with a Band-Aid covering it.   Please call our office [phone] if you have further questions.   Sexual Activity Restrictions   Complete by: As directed    Sexual activity as tolerated.  Do not push through pain.  Pain will protect you from injury.   Walk with assistance   Complete by: As directed    Walk over an hour a day.  May use a walker/cane/companion to help with balance and stamina.       Allergies as of 12/10/2018      Reactions   Aspirin Rash      Medication List    TAKE these medications   amLODipine 10 MG tablet Commonly known as: NORVASC Take 1 tablet (10 mg total) by mouth daily.   amoxicillin 500 MG tablet Commonly known as: AMOXIL Take 4 tablets (2,000 mg) one hour prior to all dental visits.   buPROPion 150 MG 24 hr tablet Commonly known as: WELLBUTRIN XL TAKE 1 TABLET BY MOUTH EVERY DAY What changed: when to take this   chlorthalidone 25 MG tablet Commonly known as: HYGROTON Take 1 tablet (25 mg total) by mouth daily.   clopidogrel 75 MG tablet Commonly known as: PLAVIX TAKE 1 TABLET (75 MG TOTAL) BY MOUTH DAILY WITH BREAKFAST.   cyclobenzaprine 10 MG tablet Commonly known as: FLEXERIL Take 10 mg by mouth 3 (three) times daily as needed for muscle spasms.   Dexilant 60 MG capsule Generic drug: dexlansoprazole TAKE 1 CAPSULE BY MOUTH EVERY DAY What changed: how much to take   famotidine 20 MG tablet Commonly known as: PEPCID Take 1 tablet (20 mg total) by mouth 2 (two) times daily. What changed:   when to take this  reasons to take this   gabapentin 100 MG capsule Commonly known as: NEURONTIN Take 100 mg by mouth 3 (three) times daily.   ondansetron 4 MG disintegrating tablet Commonly known as: ZOFRAN-ODT Take 1 tablet (4 mg total) by mouth every 8 (eight) hours as needed for nausea.   oxyCODONE 5 MG immediate release tablet Commonly known as: Oxy IR/ROXICODONE Take 1-2 tablets (5-10 mg total) by mouth every 6 (six) hours as needed for moderate pain, severe pain or breakthrough pain.   ProAir HFA 108 (90 Base) MCG/ACT inhaler Generic drug: albuterol INHALE 2 PUFFS INTO THE LUNGS EVERY 4 (FOUR) HOURS AS NEEDED FOR WHEEZING OR SHORTNESS OF BREATH. What changed: See the new instructions.   sucralfate 1 GM/10ML suspension Commonly known as: Carafate TAKE 10 MLS (1 G TOTAL) BY MOUTH 2 (TWO) TIMES DAILY.   SUMAtriptan 50  MG tablet Commonly known as: IMITREX Take 1 tablet (50 mg total) by mouth every 2 (two) hours as needed for migraine. May repeat in 2 hours if headache persists or recurs.  Durable Medical Equipment  (From admission, onward)         Start     Ordered   12/08/18 0955  For home use only DME Walker rolling  Once    Question:  Patient needs a walker to treat with the following condition  Answer:  Post-op pain   12/08/18 0957          Significant Diagnostic Studies:  Results for orders placed or performed during the hospital encounter of 12/01/18 (from the past 72 hour(s))  Basic metabolic panel     Status: None   Collection Time: 12/10/18  4:17 AM  Result Value Ref Range   Sodium 137 135 - 145 mmol/L   Potassium 3.9 3.5 - 5.1 mmol/L   Chloride 103 98 - 111 mmol/L   CO2 27 22 - 32 mmol/L   Glucose, Bld 92 70 - 99 mg/dL   BUN 13 8 - 23 mg/dL   Creatinine, Ser 4.09 0.44 - 1.00 mg/dL   Calcium 9.0 8.9 - 81.1 mg/dL   GFR calc non Af Amer >60 >60 mL/min   GFR calc Af Amer >60 >60 mL/min   Anion gap 7 5 - 15    Comment: Performed at [facility], [address], [place], [place] [address]  CBC     Status: Abnormal   Collection Time: 12/10/18  4:17 AM  Result Value Ref Range   WBC 4.6 4.0 - 10.5 K/uL   RBC 2.59 (L) 3.87 - 5.11 MIL/uL   Hemoglobin 7.5 (L) 12.0 - 15.0 g/dL   HCT 29.5 (L) 62.1 - 30.8 %   MCV 93.1 80.0 - 100.0 fL   MCH 29.0 26.0 - 34.0 pg   MCHC 31.1 30.0 - 36.0 g/dL   RDW 65.7 84.6 - 96.2 %   Platelets 293 150 - 400 K/uL   nRBC 0.0 0.0 - 0.2 %    Comment: Performed at [facility], [address], [place], [place] [address]    No results found.  Past Medical History:  Diagnosis Date  . Anemia 12/14/2012  . Ataxia 06/24/2016  . Auditory hallucinations 08/19/2010   Has has auditory hallucinations, which seem to have worsened since death of her sister 22-Jan-2010. Admission to [facility] (90s or early 2000)  for 6 weeks  after mother died, also with hallucinations.  B/C of severe mental illness and refusal to see pysch, I have refused to refill controlled meds. If she decides to pursue mental health, then she needs to take the initiative and sch the appt bc [name] has s  . Barrett's esophagus    Demonstrated on EGD 01-23-11. EGD 09/17/2012 shows inflamed GE junctional mucosa without metaplasia, dysplasia, or malignancy.  . Cataract   . Chronic anxiety    Admission to [facility] (90s or early 2000)  for 6 weeks after mother died. Complicated again by death of her sister 2010. 2012 developed hallucinations and I refused to refill controlled meds unless she see psych which she is not agreable to  . Chronic insomnia   . Depression   . DVT (deep venous thrombosis) (HCC)   . Gastroparesis    Demonstrated on GES 01-23-2011 by Dr Dalene Seltzer  . GERD (gastroesophageal reflux disease)    describes " I sometimes have a hard time talking or swallowing" - relative to the reflux  . H/O hiatal hernia   . Hiatal hernia   . HLD (hyperlipidemia) 01/2010  . HTN (hypertension)  Controlled with 2 drug therapy  . Insomnia 07/27/2006  . Left leg DVT (HCC) 09/28/2012  . Migraine   . Pulmonary nodule 02/10/2014   CT 06/2015 shows stable pulm nodules compared to prior study 2015, so likely benign.     . 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 Severe aortic stenosis     Past Surgical History:  Procedure Laterality Date  . 24 HOUR PH STUDY N/A 08/24/2017   Procedure: 24 HOUR PH STUDY IMPEDANCE;  Surgeon: Sherrilyn Ristanis, Henry L III, MD;  Location: [facility];  Service: Gastroenterology;  Laterality: N/A;  . ABDOMINAL HYSTERECTOMY    . APPENDECTOMY    . CARDIAC CATHETERIZATION    . CATARACT EXTRACTION     left eye  . COLONOSCOPY  2000&2005   Dr.Magod  . DIRECT LARYNGOSCOPY   September 2008    preoperative diagnosis hoarseness with anterior right vocal cord lesion  -  direct laryngoscopy and excisional biopsy of right anterior vocal cord lesion done by Dr. Ezzard StandingNewman  . ENDOVENOUS ABLATION SAPHENOUS VEIN W/ LASER Left 05-09-2014   EVLA left small saphenous vein by Gretta Beganodd Early MD  . ESOPHAGEAL MANOMETRY N/A 08/24/2017   Procedure: ESOPHAGEAL MANOMETRY (EM);  Surgeon: Sherrilyn Ristanis, Henry L III, MD;  Location: [facility];  Service: Gastroenterology;  Laterality: N/A;  . ESOPHAGOGASTRODUODENOSCOPY  11/2010  . ESOPHAGOGASTRODUODENOSCOPY N/A 09/17/2012   Procedure: ESOPHAGOGASTRODUODENOSCOPY (EGD);  Surgeon: Hart Carwinora M Brodie, MD;  [facility];  Service: Endoscopy;  Laterality: N/A;  . ESOPHAGOGASTRODUODENOSCOPY N/A 12/17/2012   Procedure: ESOPHAGOGASTRODUODENOSCOPY (EGD);  Surgeon: Beverley FiedlerJay M Pyrtle, MD;  [facility];  Service: Gastroenterology;  Laterality: N/A;  . EXCISION MASS HEAD N/A 03/05/2016   Procedure: EXCISION POSTERIOR SCALP MASS;  Surgeon: Axel FillerArmando Ramirez, MD;  Location: [facility] OR;  Service: General;  Laterality: N/A;  . EYE SURGERY Bilateral    cataracts removed-   . HEMORRHOID SURGERY    . HIATAL HERNIA REPAIR N/A 12/01/2018   Procedure: LAPAROSCOPIC TAKEDOWN HIATAL HERNIA NISEEN REDUCTION AND UPPER ENDOSCOPY;  Surgeon: Luretha MurphyMartin, Matthew, MD;  Location: [facility];  Service: General;  Laterality: N/A;  . KNEE ARTHROSCOPY    . LAPAROSCOPIC NISSEN FUNDOPLICATION N/A 05/02/2013   Procedure: LAPAROSCOPIC NISSEN FUNDOPLICATION;  Surgeon: Valarie MerinoMatthew B Martin, MD;  Location: [facility];  Service: General;  Laterality: N/A;  . MENISCECTOMY   July 2002    preoperative diagnosis torn medial meniscus right knee, partial medial meniscectomy, debridement chondroplasty patellofemoral joint, done by Dr. Madelon Lipsaffrey  . POLYPECTOMY  2000   Dr.Magod  . RIGHT/LEFT HEART CATH AND CORONARY ANGIOGRAPHY N/A 04/06/2018   Procedure: RIGHT/LEFT HEART CATH AND CORONARY ANGIOGRAPHY;  Surgeon: Kathleene HazelMcAlhany, Christopher D, MD;  [facility];  Service: Cardiovascular;  Laterality: N/A;   . ROTATOR CUFF REPAIR  09/21/2011   rt shoulder  . TEE WITHOUT CARDIOVERSION N/A 05/30/2018   Procedure: TRANSESOPHAGEAL ECHOCARDIOGRAM (TEE);  Surgeon: Kathleene HazelMcAlhany, Christopher D, MD;  [facility];  Service: Open Heart Surgery;  Laterality: N/A;  . TOOTH EXTRACTION Bilateral 04/05/2018   Procedure: Extraction of tooth #'s 6 and 29 with alveoloplasty and  debridement of remaining teeth;  Surgeon: Charlynne PanderKulinski, Ronald F, DDS;  Location: [facility];  Service: Oral Surgery;  Laterality: Bilateral;  . TRANSCATHETER AORTIC VALVE REPLACEMENT, TRANSFEMORAL N/A 05/30/2018   Procedure: TRANSCATHETER AORTIC VALVE REPLACEMENT, TRANSFEMORAL - MEDTRONIC VALVE;  Surgeon: Kathleene HazelMcAlhany, Christopher D, MD;  Location: [facility];  Service:  Open Heart Surgery;  Laterality: N/A;    Social History   Socioeconomic History  . Marital status: Widowed    Spouse name: Not on file  . Number of children: 1  . Years of education: some college  . Highest education level: Not on file  Occupational History  . Occupation: Retired-worked in [employer]  Social Needs  . Financial resource strain: Hard  . Food insecurity    Worry: Often true    Inability: Often true  . Transportation needs    Medical: Yes    Non-medical: No  Tobacco Use  . Smoking status: Former Smoker    Years: 50.00    Types: Cigarettes    Quit date: 06/02/1983    Years since quitting: 35.5  . Smokeless tobacco: Never Used  Substance and Sexual Activity  . Alcohol use: No    Alcohol/week: 0.0 standard drinks    Comment: quit 24 years ago  . Drug use: No  . Sexual activity: Not Currently    Birth control/protection: Post-menopausal, None  Lifestyle  . Physical activity    Days per week: Not on file    Minutes per session: Not on file  . Stress: Not on file  Relationships  . Social connections    Talks on phone: More than three times a week    Gets together: More than three times a week    Attends religious service: More than 4 times per year    Active  member of club or organization: Yes    Attends meetings of clubs or organizations: More than 4 times per year    Relationship status: Widowed  . Intimate partner violence    Fear of current or ex partner: Not on file    Emotionally abused: Not on file    Physically abused: Not on file    Forced sexual activity: Not on file  Other Topics Concern  . 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 The patient nolonger drinks alcohol.   The patient is widowed.   Patient has an adopted daughter living in [place].   Education: some college.     Family History  Problem Relation Age of Onset  . Heart disease Mother   . Hypertension Mother   . Heart attack Mother   . Hypertension Father   . Heart attack Father   . Diabetes Brother   . Stroke Neg Hx   . Cancer Neg Hx   . Colon cancer Neg Hx   . Anesthesia problems Neg Hx   . Hypotension Neg Hx   . Malignant hyperthermia Neg Hx   . Pseudochol deficiency Neg Hx   . Stomach cancer Neg Hx   . Rectal cancer Neg Hx   . Esophageal cancer Neg Hx     Current Facility-Administered Medications  Medication Dose Route Frequency Provider Last Rate Last Dose  . acetaminophen (TYLENOL) tablet 1,000 mg  1,000 mg Oral TID Karie SodaGross, , MD   1,000 mg at 12/09/18 2212  . albuterol (PROVENTIL) (2.5 MG/3ML) 0.083% nebulizer solution 3 mL  3 mL Inhalation Q4H PRN Luretha MurphyMartin, Matthew, MD  3 mL at 12/02/18 0337  . alum & mag hydroxide-simeth (MAALOX/MYLANTA) 200-200-20 MG/5ML suspension 30 mL  30 mL Oral Q6H PRN Karie Soda, , MD      . amLODipine (NORVASC) tablet 10 mg  10 mg Oral Daily Luretha MurphyMartin, Matthew, MD   10 mg at 12/09/18 1130  . bisacodyl  (DULCOLAX) suppository 10 mg  10 mg Rectal Daily Karie Soda, , MD   10 mg at 12/09/18 0823  . chlorthalidone (HYGROTON) tablet 25 mg  25 mg Oral Daily Luretha MurphyMartin, Matthew, MD   25 mg at 12/05/18 1026  . clopidogrel (PLAVIX) tablet 75 mg  75 mg Oral Q breakfast Luretha MurphyMartin, Matthew, MD   75 mg at 12/10/18 0746  . docusate sodium (COLACE) capsule 100 mg  100 mg Oral BID Jacinto HalimMaczis, Michael M, PA-C   100 mg at 12/09/18 2212  . feeding supplement (ENSURE SURGERY) liquid 237 mL  237 mL Oral BID BM Karie Soda, , MD      . fentaNYL (SUBLIMAZE) injection 12.5 mcg  12.5 mcg Intravenous Q4H PRN Karie Soda, , MD      . gabapentin (NEURONTIN) capsule 200 mg  200 mg Oral TID Karie Soda, , MD      . heparin injection 5,000 Units  5,000 Units Subcutaneous Q8H Luretha MurphyMartin, Matthew, MD   5,000 Units at 12/06/18 1404  . hydrALAZINE (APRESOLINE) injection 10 mg  10 mg Intravenous Q2H PRN Luretha MurphyMartin, Matthew, MD      . lip balm (CARMEX) ointment 1 application  1 application Topical BID Karie Soda, , MD   1 application at 12/09/18 2214  . magic mouthwash  15 mL Oral QID PRN Karie Soda, , MD      . ondansetron (ZOFRAN-ODT) disintegrating tablet 4 mg  4 mg Oral Q6H PRN Luretha MurphyMartin, Matthew, MD   4 mg at 12/08/18 1754   Or  . ondansetron [facility](ZOFRAN) injection 4 mg  4 mg Intravenous Q6H PRN Luretha MurphyMartin, Matthew, MD   4 mg at 12/09/18 [id number]  . oxyCODONE (Oxy IR/ROXICODONE) immediate release tablet 5-10 mg  5-10 mg Oral Q4H PRN Romie Leveehomas, Alicia, MD   10 mg at 12/09/18 2214  . pantoprazole (PROTONIX) EC tablet 80 mg  80 mg Oral Daily Karie Soda, , MD   80 mg at 12/09/18 1102  . phenol (CHLORASEPTIC) mouth spray 1 spray  1 spray Mouth/Throat PRN Luretha MurphyMartin, Matthew, MD   1 spray at 12/03/18 1145  . polyethylene glycol (MIRALAX / GLYCOLAX) packet 17 g  17 g Oral Daily PRN Luretha MurphyMartin, Matthew, MD   17 g at 12/08/18 1121  . polyethylene glycol (MIRALAX / GLYCOLAX) packet 17 g  17 g Oral BID Karie Soda, , MD      . SUMAtriptan (IMITREX) tablet 50 mg  50 mg Oral Q2H  PRN Luretha MurphyMartin, Matthew, MD         Allergies  Allergen Reactions  . Aspirin Rash    Signed: Lorenso Courier C    C. , MD, FACS, MASCRS Gastrointestinal and Minimally Invasive Surgery    [address] [place], [place] [id number] [phone] Main / Paging [phone] Fax   12/10/2018, 8:35 AM

## 2018-12-10 NOTE — Plan of Care (Signed)
Assessment unchanged. Pt and caregiver verbalized understanding of dc instructions through teach back. Up ambulating, wanted to walk out. Discharged via foot per request accompanied caregiver and nurse to front entrance.

## 2018-12-10 NOTE — TOC Initial Note (Signed)
Transition of Care [facility]) - Initial/Assessment Note    Patient Details  Name: Sherri Mcmillan MRN: [id number] Date of Birth: 03/28/1933  Transition of Care (TOC) CM/SW Contact:    Joaquin Courts, RN Phone Number: 12/10/2018, 10:37 AM  Clinical Narrative:     CM spoke with patient who reports that she does not feel she needs HHPT, has had it in the past and has told them not to come back after a couple of visits as she didn't need their services. Reports she is planning to go to [place] and stay with her caregiver so will have help after discharge. Reports she can walk by herself without the need for an assistive device  (RW), whish she has but does not use.               Expected Discharge Plan: [place] Barriers to Discharge: No Barriers Identified   Patient Goals and CMS Choice Patient states their goals for this hospitalization and ongoing recovery are:: to go home      Expected Discharge Plan and Services Expected Discharge Plan: [place]   Discharge Planning Services: CM Consult Post Acute Care Choice: [place] arrangements for the past 2 months: Apartment Expected Discharge Date: 12/10/18               DME Arranged: N/A DME Agency: NA       HH Arranged: Refused [facility] Agency: NA        Prior Living Arrangements/Services Living arrangements for the past 2 months: Apartment Lives with:: Self Patient language and need for interpreter reviewed:: Yes Do you feel safe going back to the place where you live?: Yes      Need for Family Participation in Patient Care: Yes (Comment) Care giver support system in place?: Yes (comment)   Criminal Activity/Legal Involvement Pertinent to Current Situation/Hospitalization: No - Comment as needed  Activities of Daily Living Home Assistive Devices/Equipment: None ADL Screening (condition at time of admission) Patient's cognitive ability adequate to safely complete  daily activities?: Yes Is the patient deaf or have difficulty hearing?: No Does the patient have difficulty seeing, even when wearing glasses/contacts?: No Does the patient have difficulty concentrating, remembering, or making decisions?: No Patient able to express need for assistance with ADLs?: No Does the patient have difficulty dressing or bathing?: No Independently performs ADLs?: Yes (appropriate for developmental age) Does the patient have difficulty walking or climbing stairs?: No Weakness of Legs: None Weakness of Arms/Hands: None  Permission Sought/Granted                  Emotional Assessment Appearance:: Appears stated age Attitude/Demeanor/Rapport: Engaged Affect (typically observed): Accepting Orientation: : Oriented to Place, Oriented to  Time, Oriented to Situation, Oriented to Self   Psych Involvement: No (comment)  Admission diagnosis:  RECURRENT HIATAL HERNIA Patient Active Problem List   Diagnosis Date Noted  . Chronic insomnia   . Chronic anxiety   . Dysphagia 12/09/2018  . Hiatal hernia with GERD 12/01/2018  . Migraine 10/27/2018  . S/P TAVR (transcatheter aortic valve replacement) 05/30/2018  . Barrett's esophagus   . Severe aortic stenosis 03/06/2018  . Aortic regurgitation 12/27/2017  . Vitamin D deficiency 06/30/2016  . Aortic atherosclerosis ([place]) 06/24/2016  . Iron deficiency anemia due to chronic blood loss 06/24/2016  . Abnormal finding on MRI of brain 06/24/2016  . Ataxia 06/24/2016  . Chronic bronchitis ([place]) 08/14/2015  . Allergic  rhinitis 07/03/2015  . Osteoporosis 02/19/2014  . S/P Nissen fundoplication (without gastrostomy tube) procedure 05/02/2013  . Anemia 12/14/2012  . History of Auditory Hallucinations 08/19/2010  . GERD (gastroesophageal reflux disease) 08/03/2010  . Hyperlipidemia   . Anxiety and depression   . HTN (hypertension)   . HLD (hyperlipidemia) 01/2010  . Chronic venous insufficiency 07/06/2006   PCP:   Levora DredgeHelberg, Justin, MD Pharmacy:   [facility] [phone][facility][id number] [place], [place] - [address] [place] [id number] Phone: [phone] Fax: [phone]     Social Determinants of Health (SDOH) Interventions    Readmission Risk Interventions No flowsheet data found.

## 2018-12-21 ENCOUNTER — Encounter: Payer: Medicare Other | Admitting: Internal Medicine

## 2018-12-28 ENCOUNTER — Other Ambulatory Visit: Payer: Self-pay

## 2018-12-28 ENCOUNTER — Ambulatory Visit (INDEPENDENT_AMBULATORY_CARE_PROVIDER_SITE_OTHER): Payer: Medicare Other | Admitting: Internal Medicine

## 2018-12-28 ENCOUNTER — Encounter: Payer: Self-pay | Admitting: Internal Medicine

## 2018-12-28 VITALS — BP 114/61 | HR 80 | Temp 98.7°F | Wt 112.5 lb

## 2018-12-28 DIAGNOSIS — F411 Generalized anxiety disorder: Secondary | ICD-10-CM

## 2018-12-28 DIAGNOSIS — Z23 Encounter for immunization: Secondary | ICD-10-CM | POA: Diagnosis not present

## 2018-12-28 DIAGNOSIS — F329 Major depressive disorder, single episode, unspecified: Secondary | ICD-10-CM

## 2018-12-28 DIAGNOSIS — Z79899 Other long term (current) drug therapy: Secondary | ICD-10-CM

## 2018-12-28 DIAGNOSIS — K5900 Constipation, unspecified: Secondary | ICD-10-CM

## 2018-12-28 DIAGNOSIS — F419 Anxiety disorder, unspecified: Secondary | ICD-10-CM

## 2018-12-28 MED ORDER — MIRTAZAPINE 15 MG PO TABS: 15.0000 mg | ORAL_TABLET | Freq: Every day | ORAL | 1 refills | Status: DC

## 2018-12-28 MED ORDER — POLYETHYLENE GLYCOL 3350 17 G PO PACK: 17.0000 g | PACK | Freq: Every day | ORAL | 0 refills | Status: DC

## 2018-12-28 NOTE — Patient Instructions (Signed)
Thank you for allowing me to provide your care. Today we are:  1) START taking miralax 1-2 times daily for your constiaption. It is important to drink plenty of fluids   2) START Mirtazapine 15 mg at bedtime. This will help with your anxiety, sleep, and decreased appetite. Call me in 2 weeks if things are not improving.

## 2018-12-28 NOTE — Assessment & Plan Note (Signed)
HPI: Patient with concurrent depression and GAD. Continues to have decrease appetite, insomnia, generalized worrying. She has been prescribed multiple medications in the past; however, stops these medications because she does not really work. She was previously been on Zoloft and amitriptyline.  A/P: - Continue Wellbutrin  - START Mirtazapine 15 mg QHS

## 2018-12-28 NOTE — Progress Notes (Signed)
Internal Medicine Clinic Attending  Case discussed with Dr. Helberg at the time of the visit.  We reviewed the resident's history and exam and pertinent patient test results.  I agree with the assessment, diagnosis, and plan of care documented in the resident's note.  Alexander Raines, M.D., Ph.D.  

## 2018-12-28 NOTE — Assessment & Plan Note (Signed)
HPI:  patient expresses concerns about constipation. She states that her last bowel movement was yesterday. It was firm and describes it as small pebbles. She took half a bottle of milk of mag and an enema. She is been in contact with G.I. She is not tried anything else for her constipation. This is new constipation over the past couple months.  A/P: - Miralax 1-2 times PRN  - Encouraged hydration

## 2018-12-28 NOTE — Progress Notes (Signed)
   CC: F/u GAD and constipation   HPI:  [name] is a 85 y.o. female with PMHx listed below presenting for GAD and constipation. Please see the A&P for the status of the patient's chronic medical problems.  Past Medical History:  Diagnosis Date  . Anemia 12/14/2012  . Ataxia 06/24/2016  . Auditory hallucinations 08/19/2010   Has has auditory hallucinations, which seem to have worsened since death of her sister January 11, 2010. Admission to [facility] (90s or early 2000)  for 6 weeks after mother died, also with hallucinations.  B/C of severe mental illness and refusal to see pysch, I have refused to refill controlled meds. If she decides to pursue mental health, then she needs to take the initiative and sch the appt bc Lorie Phenix has s  . Barrett's esophagus    Demonstrated on EGD 2011-01-12. EGD 09/17/2012 shows inflamed GE junctional mucosa without metaplasia, dysplasia, or malignancy.  . Cataract   . Chronic anxiety    Admission to [facility] (90s or early 2000)  for 6 weeks after mother died. Complicated again by death of her sister 2010. 2012 developed hallucinations and I refused to refill controlled meds unless she see psych which she is not agreable to  . Chronic insomnia   . Depression   . DVT (deep venous thrombosis) ([place])   . Gastroparesis    Demonstrated on GES January 12, 2011 by Dr Karmen Stabs  . GERD (gastroesophageal reflux disease)    describes " I sometimes have a hard time talking or swallowing" - relative to the reflux  . H/O hiatal hernia   . Hiatal hernia   . HLD (hyperlipidemia) 01/2010  . HTN (hypertension)    Controlled with 2 drug therapy  . Insomnia 07/27/2006  . Left leg DVT ([place]) 09/28/2012  . Migraine   . Pulmonary nodule 02/10/2014   CT 06/2015 shows stable pulm nodules compared to prior study 2015, so likely benign.     . 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 Severe aortic stenosis    Review of  Systems:  Performed and all others negative.  Physical Exam: Vitals:   12/28/18 0915  BP: 114/61  Pulse: 80  Temp: 98.7 F (37.1 C)  TempSrc: Oral  SpO2: 100%  Weight: 112 lb 8 oz (51 kg)   General: Well nourished female in no acute distress Pulm: Good air movement with no wheezing or crackles  CV: RRR, no murmurs, no rubs   Assessment & Plan:   See Encounters Tab for problem based charting.  Patient discussed with Dr. Rebeca Alert

## 2018-12-29 ENCOUNTER — Encounter: Payer: Self-pay | Admitting: Cardiology

## 2018-12-29 ENCOUNTER — Other Ambulatory Visit: Payer: Self-pay | Admitting: Family Medicine

## 2018-12-29 DIAGNOSIS — R5381 Other malaise: Secondary | ICD-10-CM

## 2019-01-02 ENCOUNTER — Ambulatory Visit: Payer: Medicare Other | Admitting: Cardiology

## 2019-01-04 ENCOUNTER — Other Ambulatory Visit: Payer: Self-pay | Admitting: Family Medicine

## 2019-01-04 DIAGNOSIS — E2839 Other primary ovarian failure: Secondary | ICD-10-CM

## 2019-01-17 ENCOUNTER — Encounter: Payer: Self-pay | Admitting: *Deleted

## 2019-01-17 NOTE — Progress Notes (Signed)

## 2019-01-21 NOTE — Progress Notes (Signed)
Done. Thank you.

## 2019-01-21 NOTE — Progress Notes (Signed)
Things That May Be Affecting Your Health:  Alcohol  Hearing loss  Pain    Depression  Home Safety  Sexual Health   Diabetes  Lack of physical activity  Stress   Difficulty with daily activities  Loneliness  Tiredness   Drug use X Medicines  Tobacco use  X Falls  Motor Vehicle Safety  Weight   Food choices  Oral Health  Other   YOUR PERSONALIZED HEALTH PLAN : 1. Schedule your next subsequent Medicare Wellness visit in one year 2. Attend all of your regular appointments to address your medical issues 3. Complete the preventative screenings and services   Annual Wellness Visit   Medicare Covered Preventative Screenings and [place] Men and Women Who How Often Need? Date of Last Service Action  Abdominal Aortic Aneurysm Adults with AAA risk factors Once     Alcohol Misuse and Counseling All Adults Screening once a year if no alcohol misuse. Counseling up to 4 face to face sessions.     Bone Density Measurement  Adults at risk for osteoporosis Once every 2 yrs     Lipid Panel Z13.6 All adults without CV disease Once every 5 yrs     Colorectal Cancer   Stool sample or  Colonoscopy All adults 58 and older   Once every year  Every 10 years     Depression All Adults Once a year  Today   Diabetes Screening Blood glucose, post glucose load, or GTT Z13.1  All adults at risk  Pre-diabetics  Once per year  Twice per year     Diabetes  Self-Management Training All adults Diabetics 10 hrs first year; 2 hours subsequent years. Requires Copay     Glaucoma  Diabetics  Family history of glaucoma  African Americans 83 yrs +  Hispanic Americans 27 yrs + Annually - requires coppay     Hepatitis C Z72.89 or F19.20  High Risk for HCV  Born between 1945 and 1965  Annually  Once     HIV Z11.4 All adults based on risk  Annually btw ages 7 & 38 regardless of risk  Annually > 65 yrs if at increased risk     Lung Cancer Screening Asymptomatic adults aged  34-77 with 30 pack yr history and current smoker OR quit within the last 15 yrs Annually Must have counseling and shared decision making documentation before first screen     Medical Nutrition Therapy Adults with   Diabetes  Renal disease  Kidney transplant within past 3 yrs 3 hours first year; 2 hours subsequent years     Obesity and Counseling All adults Screening once a year Counseling if BMI 30 or higher  Today   Tobacco Use Counseling Adults who use tobacco  Up to 8 visits in one year     Vaccines Z23  Hepatitis B  Influenza   Pneumonia  Adults   Once  Once every flu season  Two different vaccines separated by one year     Next Annual Wellness Visit People with Medicare Every year  Today    Services & Screenings Women Who How Often Need  Date of Last Service Action  Mammogram  Z12.31 Women over 47 One baseline ages 53-39. Annually ager 40 yrs+     Pap tests All women Annually if high risk. Every 2 yrs for normal risk women     Screening for cervical cancer with   Pap (Z01.419 nl or Z01.411abnl) &  HPV Z11.51  Women aged 22 to 27 Once every 5 yrs     Screening pelvic and breast exams All women Annually if high risk. Every 2 yrs for normal risk women     Sexually Transmitted Diseases  Chlamydia  Gonorrhea  Syphilis All at risk adults Annually for non pregnant females at increased risk      Services & Screenings Men Who How Ofter Need  Date of Last Service Action  Prostate Cancer - DRE & PSA Men over 50 Annually.  DRE might require a copay.     Sexually Transmitted Diseases  Syphilis All at risk adults Annually for men at increased risk

## 2019-03-05 ENCOUNTER — Other Ambulatory Visit: Payer: Self-pay

## 2019-03-05 DIAGNOSIS — Z20822 Contact with and (suspected) exposure to covid-19: Secondary | ICD-10-CM

## 2019-03-06 LAB — NOVEL CORONAVIRUS, NAA: SARS-CoV-2, NAA: NOT DETECTED

## 2019-03-29 ENCOUNTER — Encounter: Payer: Self-pay | Admitting: Internal Medicine

## 2019-03-29 ENCOUNTER — Encounter: Payer: Medicare Other | Admitting: Internal Medicine

## 2019-05-29 NOTE — Progress Notes (Deleted)
[facility] [place]                                       Cardiology Office Note    Date:  05/29/2019   ID:  Sherri Mcmillan, DOB [birth year], MRN [id number]  PCP:  Ina Homes, MD  Cardiologist:  Dr. Harrell Gave / Dr. Angelena Form & Dr. Cyndia Bent (TAVR)  CC: 1 year s/p TAVR  History of Present Illness:  Sherri Mcmillan is a 86 y.o. female with a history of HTN, HLD, anxiety/depression with previous admissions for drug overdose and multiple somatic complaints,hiatal hernia with GERD/reflux esophagitis/Barrett's esophaguss/p failedlaparoscopic Nissen fundoplication, andsevere AS s/p TAVR (05/30/18) who presents to clinic for follow up.   She apparently had a failure of the fundoplication and has had recurrent severe reflux causing esophagitis with burning pain in her chest, regurgitation, wheezing and shortness of breath particularly when lying down. She was being evaluated for further surgical treatment of her hiatal hernia and had a preoperative cardiac evaluation. An echocardiogram on 02/24/2018 showed a left ventricular ejection fraction of 60 to 65% with grade 2 diastolic dysfunction. The aortic valve was thickened and calcified with restricted mobility and a mean gradient of 40 mmHg, aortic valve area of 0.9 cm, and dimensionless index of 0.24 consistent with severe aortic stenosis. She was seen by Dr. Burt Knack for consideration of TAVR and underwent cardiac catheterization on 04/06/2018 which showed no coronary artery disease. The mean gradient across aortic valve was 38 with a peak to peak gradient of 43 mmHg.  She underwent successful TAVR with a76mm Medtronic Pro Plusvia the TF approach on 05/30/18. Post operative echoshowed EF 65%, normally functioning TAVR valve with a mean gradient of 10 mm Hg and trace AI. Her hospital course was complicated by chest pain felt to be related to reflux that did not resolved with IVPPI, H2 blocker and  sucralfate.General surgery was consulted who recommended outpatient follow up with Dr. Hassell Done and increase PPI to BID. Of note, she had some mild hypokalemia and she was started on a potassium supplement. 1 month echo showed EF 60%, normally functioning TAVR with mean gradient of 6 mm Hg and no PVL.  She underwent successful laparoscopic lysed lesions with takedown of the hiatal hernia and transition to a partial fundoplication in 0/9735.  Today she presents to clinic for follow up.  Past Medical History:  Diagnosis Date  . Anemia 12/14/2012  . Ataxia 06/24/2016  . Auditory hallucinations 08/19/2010   Has has auditory hallucinations, which seem to have worsened since death of her sister 2010/01/29. Admission to [facility] (90s or early 2000)  for 6 weeks after mother died, also with hallucinations.  B/C of severe mental illness and refusal to see pysch, I have refused to refill controlled meds. If she decides to pursue mental health, then she needs to take the initiative and sch the appt bc Lorie Phenix has s  . Barrett's esophagus    Demonstrated on EGD 2011-01-30. EGD 09/17/2012 shows inflamed GE junctional mucosa without metaplasia, dysplasia, or malignancy.  . Cataract   . Chronic anxiety    Admission to [facility] (90s or early 2000)  for 6 weeks after mother died. Complicated again by death of her sister 2010. 2012 developed hallucinations and I refused to refill controlled meds unless she see psych which she is not agreable to  .  Chronic insomnia   . Depression   . DVT (deep venous thrombosis) (HCC)   . Gastroparesis    Demonstrated on GES 12/2010 by Dr Dalene Seltzer  . GERD (gastroesophageal reflux disease)    describes " I sometimes have a hard time talking or swallowing" - relative to the reflux  . H/O hiatal hernia   . Hiatal hernia   . HLD (hyperlipidemia) 01/2010  . HTN (hypertension)    Controlled with 2 drug therapy  . Insomnia 07/27/2006  . Left leg DVT (HCC) 09/28/2012  . Migraine   . Pulmonary nodule  02/10/2014   CT 06/2015 shows stable pulm nodules compared to prior study 2015, so likely benign.     . 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 Severe aortic stenosis     Past Surgical History:  Procedure Laterality Date  . 24 HOUR PH STUDY N/A 08/24/2017   Procedure: 24 HOUR PH STUDY IMPEDANCE;  Surgeon: Sherrilyn Rist, MD;  Location: [facility] ENDOSCOPY;  Service: Gastroenterology;  Laterality: N/A;  . ABDOMINAL HYSTERECTOMY    . APPENDECTOMY    . CARDIAC CATHETERIZATION    . CATARACT EXTRACTION     left eye  . COLONOSCOPY  2000&2005   Dr.Magod  . DIRECT LARYNGOSCOPY   September 2008    preoperative diagnosis hoarseness with anterior right vocal cord lesion -  direct laryngoscopy and excisional biopsy of right anterior vocal cord lesion done by Dr. Ezzard Standing  . ENDOVENOUS ABLATION SAPHENOUS VEIN W/ LASER Left 05-09-2014   EVLA left small saphenous vein by Gretta Began MD  . ESOPHAGEAL MANOMETRY N/A 08/24/2017   Procedure: ESOPHAGEAL MANOMETRY (EM);  Surgeon: Sherrilyn Rist, MD;  Location: [facility] ENDOSCOPY;  Service: Gastroenterology;  Laterality: N/A;  . ESOPHAGOGASTRODUODENOSCOPY  11/2010  . ESOPHAGOGASTRODUODENOSCOPY N/A 09/17/2012   Procedure: ESOPHAGOGASTRODUODENOSCOPY (EGD);  Surgeon: Hart Carwin, MD;  Location: [facility] ENDOSCOPY;  Service: Endoscopy;  Laterality: N/A;  . ESOPHAGOGASTRODUODENOSCOPY N/A 12/17/2012   Procedure: ESOPHAGOGASTRODUODENOSCOPY (EGD);  Surgeon: Beverley Fiedler, MD;  Location: [facility] ENDOSCOPY;  Service: Gastroenterology;  Laterality: N/A;  . EXCISION MASS HEAD N/A 03/05/2016   Procedure: EXCISION POSTERIOR SCALP MASS;  Surgeon: Axel Filler, MD;  Location: [facility] OR;  Service: General;  Laterality: N/A;  . EYE SURGERY Bilateral    cataracts removed-   . HEMORRHOID SURGERY    . HIATAL HERNIA REPAIR N/A 12/01/2018   Procedure: LAPAROSCOPIC TAKEDOWN HIATAL HERNIA NISEEN REDUCTION AND UPPER  ENDOSCOPY;  Surgeon: Luretha Murphy, MD;  Location: [facility] ORS;  Service: General;  Laterality: N/A;  . KNEE ARTHROSCOPY    . LAPAROSCOPIC NISSEN FUNDOPLICATION N/A 05/02/2013   Procedure: LAPAROSCOPIC NISSEN FUNDOPLICATION;  Surgeon: Valarie Merino, MD;  Location: [facility] ORS;  Service: General;  Laterality: N/A;  . MENISCECTOMY   July 2002    preoperative diagnosis torn medial meniscus right knee, partial medial meniscectomy, debridement chondroplasty patellofemoral joint, done by Dr. Madelon Lips  . POLYPECTOMY  2000   Dr.Magod  . RIGHT/LEFT HEART CATH AND CORONARY ANGIOGRAPHY N/A 04/06/2018   Procedure: RIGHT/LEFT HEART CATH AND CORONARY ANGIOGRAPHY;  Surgeon: Kathleene Hazel, MD;  [facility]: [facility] INVASIVE CV LAB;  Service: Cardiovascular;  Laterality: N/A;  . ROTATOR CUFF REPAIR  09/21/2011   rt shoulder  . TEE WITHOUT CARDIOVERSION N/A 05/30/2018   Procedure: TRANSESOPHAGEAL ECHOCARDIOGRAM (TEE);  Surgeon: Kathleene Hazel, MD;  Location: [facility] OR;  Service: Open Heart Surgery;  Laterality: N/A;  . TOOTH EXTRACTION Bilateral 04/05/2018   Procedure: Extraction of tooth #'s 6 and 29 with alveoloplasty and gross debridement of remaining teeth;  Surgeon: Charlynne Pander, DDS;  Location: [facility] OR;  Service: Oral Surgery;  Laterality: Bilateral;  . TRANSCATHETER AORTIC VALVE REPLACEMENT, TRANSFEMORAL N/A 05/30/2018   Procedure: TRANSCATHETER AORTIC VALVE REPLACEMENT, TRANSFEMORAL - MEDTRONIC VALVE;  Surgeon: Kathleene Hazel, MD;  Location: [facility] OR;  Service: Open Heart Surgery;  Laterality: N/A;    Current Medications: Outpatient Medications Prior to Visit  Medication Sig Dispense Refill  . amLODipine (NORVASC) 10 MG tablet Take 1 tablet (10 mg total) by mouth daily. 90 tablet 2  . amoxicillin (AMOXIL) 500 MG tablet Take 4 tablets (2,000 mg) one hour prior to all dental visits. (Patient not taking: Reported on 11/28/2018) 8 tablet 6  . buPROPion (WELLBUTRIN XL) 150 MG 24 hr tablet TAKE 1 TABLET  BY MOUTH EVERY DAY (Patient taking differently: Take 150 mg by mouth once a week. ) 90 tablet 0  . chlorthalidone (HYGROTON) 25 MG tablet Take 1 tablet (25 mg total) by mouth daily. 90 tablet 3  . clopidogrel (PLAVIX) 75 MG tablet TAKE 1 TABLET (75 MG TOTAL) BY MOUTH DAILY WITH BREAKFAST. (Patient not taking: Reported on 11/28/2018) 90 tablet 1  . cyclobenzaprine (FLEXERIL) 10 MG tablet Take 10 mg by mouth 3 (three) times daily as needed for muscle spasms.     . DEXILANT 60 MG capsule TAKE 1 CAPSULE BY MOUTH EVERY DAY (Patient taking differently: Take 60 mg by mouth daily. ) 90 capsule 3  . famotidine (PEPCID) 20 MG tablet Take 1 tablet (20 mg total) by mouth 2 (two) times daily. (Patient taking differently: Take 20 mg by mouth daily as needed for heartburn. ) 60 tablet 1  . gabapentin (NEURONTIN) 100 MG capsule Take 100 mg by mouth 3 (three) times daily.    . mirtazapine (REMERON) 15 MG tablet Take 1 tablet (15 mg total) by mouth at bedtime. 90 tablet 1  . ondansetron (ZOFRAN-ODT) 4 MG disintegrating tablet Take 1 tablet (4 mg total) by mouth every 8 (eight) hours as needed for nausea. 8 tablet 5  . oxyCODONE (OXY IR/ROXICODONE) 5 MG immediate release tablet Take 1-2 tablets (5-10 mg total) by mouth every 6 (six) hours as needed for moderate pain, severe pain or breakthrough pain. 20 tablet 0  . polyethylene glycol (MIRALAX) 17 g packet Take 17 g by mouth daily. 90 each 0  . PROAIR HFA 108 (90 Base) MCG/ACT inhaler INHALE 2 PUFFS INTO THE LUNGS EVERY 4 (FOUR) HOURS AS NEEDED FOR WHEEZING OR SHORTNESS OF BREATH. (Patient taking differently: Inhale 2 puffs into the lungs every 4 (four) hours as needed for wheezing or shortness of breath. ) 8.5 Inhaler 3  . sucralfate (CARAFATE) 1 GM/10ML suspension TAKE 10 MLS (1 G TOTAL) BY MOUTH 2 (TWO) TIMES DAILY. (Patient not taking: Reported on 11/28/2018) 420 mL 1  . SUMAtriptan (IMITREX) 50 MG tablet Take 1 tablet (50 mg total) by mouth every 2 (two) hours as needed  for migraine. May repeat in 2 hours if headache persists or recurs. (Patient not taking: Reported on 11/28/2018) 10 tablet 0   No facility-administered medications prior to visit.     Allergies:   Aspirin   Social History   Socioeconomic History  . Marital status: Widowed    Spouse name: Not on file  . Number of children: 1  . Years of education: some college  .  Highest education level: Not on file  Occupational History  . Occupation: Retired-worked in [employer]  Tobacco Use  . Smoking status: Former Smoker    Years: 50.00    Types: Cigarettes    Quit date: 06/02/1983    Years since quitting: 36.0  . Smokeless tobacco: Never Used  Substance and Sexual Activity  . Alcohol use: No    Alcohol/week: 0.0 standard drinks    Comment: quit 24 years ago  . Drug use: No  . Sexual activity: Not Currently    Birth control/protection: Post-menopausal, None  Other Topics Concern  . 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 The patient nolonger drinks alcohol.   The patient is widowed.   Patient has an adopted daughter living in [place].   Education: some college.    Social Determinants of Health   Financial Resource Strain:   . Difficulty of Paying Living Expenses: Not on file  Food Insecurity:   . Worried About Programme researcher, broadcasting/film/video in the Last Year: Not on file  . Ran Out of Food in the Last Year: Not on file  Transportation Needs:   . Lack of Transportation (Medical): Not on file  . Lack of Transportation (Non-Medical): Not on file  Physical Activity:   . Days of Exercise per Week: Not on file  . Minutes of Exercise per Session: Not on file  Stress:   . Feeling of  Stress : Not on file  Social Connections:   . Frequency of Communication with Friends and Family: Not on file  . Frequency of Social Gatherings with Friends and Family: Not on file  . Attends Religious Services: Not on file  . Active Member of Clubs or Organizations: Not on file  . Attends Banker Meetings: Not on file  . Marital Status: Not on file     Family History:  The patient's family history includes Diabetes in her brother; Heart attack in her father and mother; Heart disease in her mother; Hypertension in her father and mother.     ROS:   Please see the history of present illness.    ROS All other systems reviewed and are negative.   PHYSICAL EXAM:   VS:  LMP 04/26/1948    GEN: Well nourished, well developed, in no acute distress HEENT: normal Neck: no JVD or masses Cardiac: RRR; soft flow murmur. No rubs, or gallops,no edema  Respiratory:  clear to auscultation bilaterally, normal work of breathing GI: soft, nontender, nondistended, + BS MS: no deformity or atrophy Skin: warm and dry, no rash. Groin sites stable with no hematoma or ecchymosis  Neuro:  Alert and Oriented x 3, Strength and sensation are intact Psych: euthymic mood, full affect   Wt Readings from Last 3 Encounters:  12/28/18 112 lb 8 oz (51 kg)  12/01/18 116 lb 4 oz (52.7 kg)  11/29/18 116 lb 4 oz (52.7 kg)      Studies/Labs Reviewed:   EKG:  EKG is NOT ordered today.    Recent Labs: 05/31/2018: Magnesium 1.8 12/06/2018: ALT 24 12/10/2018: BUN 13; Creatinine, Ser 0.81; Hemoglobin 7.5;  Platelets 293; Potassium 3.9; Sodium 137   Lipid Panel    Component Value Date/Time   CHOL 220 (H) 06/25/2016 0251   TRIG 44 06/25/2016 0251   HDL 93 06/25/2016 0251   CHOLHDL 2.4 06/25/2016 0251   VLDL 9 06/25/2016 0251   LDLCALC 118 (H) 06/25/2016 0251    Additional studies/ records that were reviewed today include:  TAVR OPERATIVE NOTE   Date of  Procedure:05/30/2018  Preoperative Diagnosis:Severe Aortic Stenosis   Postoperative Diagnosis:Same   Procedure:   Transcatheter Aortic Valve Replacement - PercutaneousRightTransfemoral Approach Medtronic Evolut PRO +(size 26mm, serial # [id number])  Co-Surgeons:Bryan Jennefer BravoK. Bartle, MD and Verne Carrowhristopher McAlhany, MD  c Anesthesiologist:R. Bradley FerrisEllender, MD  Lynn ItoEchocardiographer:K. Nelson, MD  Pre-operative Echo Findings: ? Severe aortic stenosis ? Normalleft ventricular systolic function  Post-operative Echo Findings: ? Trivialparavalvular leak ? Normalleft ventricular systolic function  _______________  Echo 05/31/18 IMPRESSIONS 1. The left ventricle has hyperdynamic systolic function of >65%. The cavity size is normal. There is no increased left ventricular wall thickness. Echo evidence of impaired diastolic relaxation. 2. The right ventricle has normal systolic function. The cavity in normal in size. There is no increase in right ventricular wall thickness. 3. Mildly dilated left atrial size. 4. A 26 a Medtronic CoreValve-Evolut Pro bioprosthetic aortic valve valve is present in the aortic position. Normal aortic valve prosthesis. 5. Normal LV systolic function; mild diastolic dysfunction; s/p TAVR with trace AI and mean gradient 10 mmHg; mild LAE; mild TR with mild pulmonary hypertension.  _______________  Echo 06/29/18 IMPRESSIONS  1. The left ventricle has normal systolic function with an ejection fraction of 60-65%. The cavity size was normal. Left ventricular diastolic Doppler parameters are consistent with pseudonormalization No evidence of left ventricular regional wall  motion abnormalities.  2. The right ventricle has normal systolic function. The cavity was normal. There is no increase in right ventricular wall thickness.  3. The mitral valve is normal in  structure. Mild thickening of the mitral valve leaflet.  4. The tricuspid valve is normal in structure.  5. Post TAVR 26 Medtronic Core Valve no PVL stable gradients.  6. The pulmonic valve was normal in structure.  7. No evidence of left ventricular regional wall motion abnormalities.   ________________  Echo 05/31/19 ***  ASSESSMENT & PLAN:   Severe AS s/p TAVR: has amoxacillin. Chronic plavix.   Medication Adjustments/Labs and Tests Ordered: Current medicines are reviewed at length with the patient today.  Concerns regarding medicines are outlined above.  Medication changes, Labs and Tests ordered today are listed in the Patient Instructions below. There are no Patient Instructions on file for this visit.   Signed, Cline CrockKathryn , PA-C  05/29/2019 4:26 PM    [facility] [address], [place], [place]  [address] Phone: [phone]; Fax: [phone]) [phone]

## 2019-05-31 ENCOUNTER — Ambulatory Visit: Payer: Self-pay | Admitting: Physician Assistant

## 2019-05-31 ENCOUNTER — Telehealth: Payer: Self-pay | Admitting: Physician Assistant

## 2019-05-31 ENCOUNTER — Other Ambulatory Visit (HOSPITAL_COMMUNITY): Payer: Medicare Other

## 2019-05-31 NOTE — Telephone Encounter (Signed)
  [facility]   MULTIDISCIPLINARY HEART VALVE TEAM  Patient did not show for her 1 year TAVR evaluation and echo. We tried calling all of the numbers listed on her Epic chart. There are no working numbers and we are not able to get a hold of her.   Cline Crock PA-C  [facility]

## 2019-06-06 ENCOUNTER — Encounter (HOSPITAL_COMMUNITY): Payer: Self-pay | Admitting: Radiology

## 2019-06-07 NOTE — Telephone Encounter (Signed)
Letter mailed to the pt's home to contact the office to reschedule her missed appointments.

## 2019-06-19 ENCOUNTER — Telehealth (HOSPITAL_COMMUNITY): Payer: Self-pay | Admitting: Radiology

## 2019-06-19 NOTE — Telephone Encounter (Signed)
Just an FYI. We have made several attempts to contact this patient including sending a letter to schedule or reschedule their echocardiogram. We will be removing the patient from the echo WQ. [id number]  both listed number do not work. Will send letter  2.4.21 no show  Thank you

## 2019-08-29 ENCOUNTER — Other Ambulatory Visit: Payer: Self-pay | Admitting: Family Medicine

## 2019-08-29 ENCOUNTER — Encounter: Payer: Self-pay | Admitting: *Deleted

## 2019-08-29 DIAGNOSIS — E2839 Other primary ovarian failure: Secondary | ICD-10-CM

## 2019-08-29 DIAGNOSIS — R5381 Other malaise: Secondary | ICD-10-CM

## 2019-11-08 ENCOUNTER — Ambulatory Visit: Payer: Medicare HMO | Admitting: Cardiology

## 2019-11-13 ENCOUNTER — Other Ambulatory Visit: Payer: Medicare Other

## 2019-11-26 ENCOUNTER — Encounter: Payer: Self-pay | Admitting: Student

## 2019-12-06 ENCOUNTER — Encounter: Payer: Self-pay | Admitting: Student

## 2019-12-11 ENCOUNTER — Encounter: Payer: Self-pay | Admitting: Cardiology

## 2020-01-01 ENCOUNTER — Encounter: Payer: Self-pay | Admitting: Gastroenterology

## 2020-01-25 ENCOUNTER — Encounter: Payer: Self-pay | Admitting: Gastroenterology

## 2020-03-10 ENCOUNTER — Other Ambulatory Visit: Payer: Self-pay | Admitting: Registered Nurse

## 2020-03-10 DIAGNOSIS — E2839 Other primary ovarian failure: Secondary | ICD-10-CM

## 2020-03-27 ENCOUNTER — Other Ambulatory Visit: Payer: Self-pay | Admitting: Physician Assistant

## 2020-03-27 DIAGNOSIS — R42 Dizziness and giddiness: Secondary | ICD-10-CM

## 2020-03-27 DIAGNOSIS — H9202 Otalgia, left ear: Secondary | ICD-10-CM

## 2020-06-20 ENCOUNTER — Ambulatory Visit
Admission: RE | Admit: 2020-06-20 | Discharge: 2020-06-20 | Disposition: A | Discharge status: Home / Self Care | Payer: Medicare Other | Source: Ambulatory Visit | Attending: Registered Nurse | Admitting: Registered Nurse

## 2020-06-20 ENCOUNTER — Other Ambulatory Visit: Payer: Medicare Other

## 2020-06-20 ENCOUNTER — Other Ambulatory Visit: Payer: Self-pay

## 2020-06-20 DIAGNOSIS — E2839 Other primary ovarian failure: Secondary | ICD-10-CM

## 2020-06-25 ENCOUNTER — Encounter: Payer: Self-pay | Admitting: Neurology

## 2020-08-14 NOTE — Progress Notes (Signed)
NEUROLOGY FOLLOW UP OFFICE NOTE  Sherri Mcmillan [id number]  Assessment/Plan:   1.  Migraine without aura, without status migrainosus 2.  Dizziness associated with migraine - near syncope rather than vertigo 3.  Abnormal brain MRI  1.  Repeat MRI of brain with and without contrast 2.  Stop sertraline.  Start venlafaxine 37.5mg  twice daily 3.  For migraine rescue, she will try samples of Ubrelvy.  Due to age, would not use triptans. 4.  As her dizziness is not vertigo, will discontinue meclizine 5.  Follow up in 6 months.  Subjective:  Sherri Mcmillan is an 87 year old right female with HTN, HLD, depression who presents for headache.  History supplemented by referring provider's note.   She has had migraines off and on for several years.  She saw my colleague, Dr. Allena Katz, in March 2018 for headache and ataxia.  Earlier that month, she was hospitalized for slurred speech,f alls and coffee ground emesis.  MRI of brain showed hyperintensity in the splenium of the corpus callosum.  Symptoms thought to be due to dehydration and symptoms improved with IVF.  Prior to hospitalization, she was having headaches.  She was started on nortriptyline.  MRI of brain with contrast performed in April 2018 showed that the lesion did not enhance.   She underwent lumbar puncture which demonstrated CSF cell count 7, 84% lymphs, protein 86, glucose 51, >5 oligoclonal bands, IgG index 0.41, ACE 8, negative GS and culture,  Negative fungal culture, negative cryptococcal, negative JCV.  However, patient was lost to follow up.  She started having episodes of migraines beginning a couple of months ago.  She describes frontal-temporal pounding pain radiating to left ear associated with photophobia.  However, they are now accompanied by dizziness, which is new.  It is not spinning sensation.  She experiences lightheadedness, like she is going to pass out, with tunnel vision.  She never loses consciousness.  It lasts 10-15  minutes.  Often occurs at night out of sleep and still feels it in the morning.  Resolves later in the morning.  It occurs 2 to 3 times month.  She saw ENT and was told her exam was unremarkable.  She was prescribed meclizine which doesn't work.  Current NSAIDs/analgesics:  Hydrocodone-acetaminophen Current antihypertensive:  Amlodipine Current antidepressant:  Sertraline 50mg  QD Current antiepileptic:  Gabapentin 100mg  TID PRN (for neuropathy) Current antihistamine:  meclizine Other medication:  busiprone  Past triptan:  sumatriptan Past antihypertensive:  Propranolol Past antidepressant:  Nortriptyline, mirtazapine, Wellbutrin, Celexa, Lexapro Past antiepileptic:  topiramate  PAST MEDICAL HISTORY: Past Medical History:  Diagnosis Date  . Anemia 12/14/2012  . Ataxia 06/24/2016  . Auditory hallucinations 08/19/2010   Has has auditory hallucinations, which seem to have worsened since death of her sister 01-25-10. Admission to [facility] (90s or early 2000)  for 6 weeks after mother died, also with hallucinations.  B/C of severe mental illness and refusal to see pysch, I have refused to refill controlled meds. If she decides to pursue mental health, then she needs to take the initiative and sch the appt bc [facility] has s  . Barrett's esophagus    Demonstrated on EGD 01/26/2011. EGD 09/17/2012 shows inflamed GE junctional mucosa without metaplasia, dysplasia, or malignancy.  . Cataract   . Chronic anxiety    Admission to [facility] (90s or early 2000)  for 6 weeks after mother died. Complicated again by death of her sister 2010. 2012 developed hallucinations and I refused  to refill controlled meds unless she see psych which she is not agreable to  . Chronic insomnia   . Depression   . DVT (deep venous thrombosis) (HCC)   . Gastroparesis    Demonstrated on GES 12/2010 by Dr Dalene Seltzer  . GERD (gastroesophageal reflux disease)    describes " I sometimes have a hard time talking or swallowing" - relative to the reflux   . H/O hiatal hernia   . Hiatal hernia   . HLD (hyperlipidemia) 01/2010  . HTN (hypertension)    Controlled with 2 drug therapy  . Insomnia 07/27/2006  . Left leg DVT (HCC) 09/28/2012  . Migraine   . Pulmonary nodule 02/10/2014   CT 06/2015 shows stable pulm nodules compared to prior study 2015, so likely benign.     . 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 Severe aortic stenosis     MEDICATIONS: Current Outpatient Medications on File Prior to Visit  Medication Sig Dispense Refill  . amLODipine (NORVASC) 10 MG tablet Take 1 tablet (10 mg total) by mouth daily. 90 tablet 2  . amoxicillin (AMOXIL) 500 MG tablet Take 4 tablets (2,000 mg) one hour prior to all dental visits. (Patient not taking: Reported on 11/28/2018) 8 tablet 6  . buPROPion (WELLBUTRIN XL) 150 MG 24 hr tablet TAKE 1 TABLET BY MOUTH EVERY DAY (Patient taking differently: Take 150 mg by mouth once a week. ) 90 tablet 0  . chlorthalidone (HYGROTON) 25 MG tablet Take 1 tablet (25 mg total) by mouth daily. 90 tablet 3  . clopidogrel (PLAVIX) 75 MG tablet TAKE 1 TABLET (75 MG TOTAL) BY MOUTH DAILY WITH BREAKFAST. (Patient not taking: Reported on 11/28/2018) 90 tablet 1  . cyclobenzaprine (FLEXERIL) 10 MG tablet Take 10 mg by mouth 3 (three) times daily as needed for muscle spasms.     . DEXILANT 60 MG capsule TAKE 1 CAPSULE BY MOUTH EVERY DAY (Patient taking differently: Take 60 mg by mouth daily. ) 90 capsule 3  . famotidine (PEPCID) 20 MG tablet Take 1 tablet (20 mg total) by mouth 2 (two) times daily. (Patient taking differently: Take 20 mg by mouth daily as needed for heartburn. ) 60 tablet 1  . gabapentin (NEURONTIN) 100 MG capsule Take 100 mg by mouth 3 (three) times daily.    . mirtazapine (REMERON) 15 MG tablet Take 1 tablet (15 mg total) by mouth at bedtime. 90 tablet 1  . ondansetron (ZOFRAN-ODT) 4 MG disintegrating tablet Take 1 tablet (4  mg total) by mouth every 8 (eight) hours as needed for nausea. 8 tablet 5  . oxyCODONE (OXY IR/ROXICODONE) 5 MG immediate release tablet Take 1-2 tablets (5-10 mg total) by mouth every 6 (six) hours as needed for moderate pain, severe pain or breakthrough pain. 20 tablet 0  . polyethylene glycol (MIRALAX) 17 g packet Take 17 g by mouth daily. 90 each 0  . PROAIR HFA 108 (90 Base) MCG/ACT inhaler INHALE 2 PUFFS INTO THE LUNGS EVERY 4 (FOUR) HOURS AS NEEDED FOR WHEEZING OR SHORTNESS OF BREATH. (Patient taking differently: Inhale 2 puffs into the lungs every 4 (four) hours as needed for wheezing or shortness of breath. ) 8.5 Inhaler 3  . sucralfate (CARAFATE) 1 GM/10ML suspension TAKE 10 MLS (1 G TOTAL) BY MOUTH 2 (TWO) TIMES DAILY. (Patient not taking: Reported on 11/28/2018) 420 mL 1  . SUMAtriptan (IMITREX) 50 MG tablet Take  1 tablet (50 mg total) by mouth every 2 (two) hours as needed for migraine. May repeat in 2 hours if headache persists or recurs. (Patient not taking: Reported on 11/28/2018) 10 tablet 0   No current facility-administered medications on file prior to visit.    ALLERGIES: Allergies  Allergen Reactions  . Aspirin Rash    FAMILY HISTORY: Family History  Problem Relation Age of Onset  . Heart disease Mother   . Hypertension Mother   . Heart attack Mother   . Hypertension Father   . Heart attack Father   . Diabetes Brother   . Stroke Neg Hx   . Cancer Neg Hx   . Colon cancer Neg Hx   . Anesthesia problems Neg Hx   . Hypotension Neg Hx   . Malignant hyperthermia Neg Hx   . Pseudochol deficiency Neg Hx   . Stomach cancer Neg Hx   . Rectal cancer Neg Hx   . Esophageal cancer Neg Hx       Objective:  Blood pressure (!) 166/92, pulse 65, height 4\' 11"  (1.499 m), weight 110 lb 3.2 oz (50 kg), last menstrual period 04/26/1950, SpO2 99 %. General: No acute distress.  Patient appears well-groomed.   Head:  Normocephalic/atraumatic Eyes:  Fundi examined but not  visualized Neck: supple, no paraspinal tenderness, full range of motion Heart:  Regular rate and rhythm Lungs:  Clear to auscultation bilaterally Back: No paraspinal tenderness Neurological Exam: alert and oriented to person, place, and time. Speech fluent and not dysarthric, language intact.  CN II-XII intact. Bulk and tone normal, muscle strength 5/5 throughout.  Sensation to light touch, temperature and vibration intact.  Deep tendon reflexes absent throughout, toes downgoing.  Finger to nose testing intact.  Gait steady. Romberg negative.     06/25/1948, DO

## 2020-08-15 ENCOUNTER — Ambulatory Visit (INDEPENDENT_AMBULATORY_CARE_PROVIDER_SITE_OTHER): Payer: Medicare Other | Admitting: Neurology

## 2020-08-15 ENCOUNTER — Other Ambulatory Visit: Payer: Self-pay

## 2020-08-15 ENCOUNTER — Encounter: Payer: Self-pay | Admitting: Neurology

## 2020-08-15 VITALS — BP 166/92 | HR 65 | Ht 59.0 in | Wt 110.2 lb

## 2020-08-15 DIAGNOSIS — R519 Headache, unspecified: Secondary | ICD-10-CM

## 2020-08-15 DIAGNOSIS — R42 Dizziness and giddiness: Secondary | ICD-10-CM | POA: Diagnosis not present

## 2020-08-15 DIAGNOSIS — R9089 Other abnormal findings on diagnostic imaging of central nervous system: Secondary | ICD-10-CM

## 2020-08-15 DIAGNOSIS — G43009 Migraine without aura, not intractable, without status migrainosus: Secondary | ICD-10-CM

## 2020-08-15 MED ORDER — UBRELVY 100 MG PO TABS: 100.0000 mg | ORAL_TABLET | ORAL | 0 refills | Status: DC | PRN

## 2020-08-15 MED ORDER — VENLAFAXINE HCL 37.5 MG PO TABS: 37.5000 mg | ORAL_TABLET | Freq: Two times a day (BID) | ORAL | 5 refills | Status: DC

## 2020-08-15 NOTE — Patient Instructions (Signed)
1.  Stop sertraline 2.  Start venlafaxine 37.5mg  twice daily - take with food 3.  At earliest onset of migraine, take Ubrelvy.  May repeat in 2 hours.  Maximum 2 tablets in 24 hours.  Let me know if it works. 4.  Stop meclizine 5.  MRI of brain with and without contrast 6.  Follow up 6 months.

## 2020-09-03 ENCOUNTER — Other Ambulatory Visit: Payer: Medicare Other

## 2020-09-05 ENCOUNTER — Inpatient Hospital Stay: Admission: RE | Admit: 2020-09-05 | Payer: Medicare Other | Source: Ambulatory Visit

## 2020-09-14 ENCOUNTER — Ambulatory Visit
Admission: RE | Admit: 2020-09-14 | Discharge: 2020-09-14 | Disposition: A | Discharge status: Home / Self Care | Payer: Medicare Other | Source: Ambulatory Visit | Attending: Neurology | Admitting: Neurology

## 2020-09-14 ENCOUNTER — Other Ambulatory Visit: Payer: Self-pay

## 2020-09-14 DIAGNOSIS — R519 Headache, unspecified: Secondary | ICD-10-CM

## 2020-09-14 DIAGNOSIS — R9089 Other abnormal findings on diagnostic imaging of central nervous system: Secondary | ICD-10-CM

## 2020-09-14 DIAGNOSIS — R42 Dizziness and giddiness: Secondary | ICD-10-CM

## 2020-09-14 DIAGNOSIS — G43009 Migraine without aura, not intractable, without status migrainosus: Secondary | ICD-10-CM

## 2020-09-14 MED ORDER — GADOBENATE DIMEGLUMINE 529 MG/ML IV SOLN
10.0000 mL | Freq: Once | INTRAVENOUS | Status: AC | PRN
  Administered 2020-09-14: 10 mL via INTRAVENOUS

## 2020-09-26 ENCOUNTER — Telehealth: Payer: Self-pay | Admitting: *Deleted

## 2020-09-26 NOTE — Telephone Encounter (Signed)
HAVE PATIENT'S NOTES ON FILE...REFERRED BY KIM NGUYEN, NP [phone]. CHART PREP MD

## 2020-09-30 ENCOUNTER — Telehealth: Payer: Self-pay | Admitting: Cardiology

## 2020-09-30 NOTE — Progress Notes (Signed)
Cardiology Office Note:    Date:  10/01/2020   ID:  Sherri Mcmillan, DOB 09-May-1932, MRN [id number]  PCP:  Arthur Holms, NP  Cardiologist:  Buford Dresser, MD PhD  Referring MD: Arthur Holms NP  CC: chest pain and leg cramps  History of Present Illness:    Sherri Mcmillan is a 87 y.o. female with a hx of severe AS s/p TAVR, hiatal hernia/Barrett's esophagus, history of DVT, HTN, HLD who is seen for urgent follow up. I initially met her 10/17/2018 for preoperative evaluation. She was followed by Dr. Angelena Form and Angelena Form post TAVR.  Today: She was re-referred to Cardiology by Dr. Arthur Holms for chest pain and shortness of breath. Today, she is accompanied by her daughter, who also provides some history. For 2 months she has had right chest pain and tenderness, and she states she can feel a lump in that area. She reports this was the surgical site for a previous procedure. Deep inspiration is painful for her.    She is also having significant pain in her left LE and toes. At night her left LE pain feels cold and her toes ache. Sometimes she is unable to walk. It can be a shooting or electric sensation as well. No wounds, no discoloration.  Occasionally she feels short of breath and lightheaded. Her daughter reports she also suffers from acid reflux regularly.  She denies any palpitations, or exertional symptoms. No headaches, or syncope to report. Also has no lower extremity edema, orthopnea or PND.    Past Medical History:  Diagnosis Date  . Anemia 12/14/2012  . Ataxia 06/24/2016  . Auditory hallucinations 08/19/2010   Has has auditory hallucinations, which seem to have worsened since death of her sister January 19, 2010. Admission to [facility] (90s or early 2000)  for 6 weeks after mother died, also with hallucinations.  B/C of severe mental illness and refusal to see pysch, I have refused to refill controlled meds. If she decides to pursue mental health, then she needs to take the initiative  and sch the appt bc [facility] has s  . Barrett's esophagus    Demonstrated on EGD Jan 20, 2011. EGD 09/17/2012 shows inflamed GE junctional mucosa without metaplasia, dysplasia, or malignancy.  . Cataract   . Chronic anxiety    Admission to [facility] (90s or early 2000)  for 6 weeks after mother died. Complicated again by death of her sister 2010. 2012 developed hallucinations and I refused to refill controlled meds unless she see psych which she is not agreable to  . Chronic insomnia   . Depression   . DVT (deep venous thrombosis) ([place])   . Gastroparesis    Demonstrated on GES 2011-01-20 by Dr Karmen Stabs  . GERD (gastroesophageal reflux disease)    describes " I sometimes have a hard time talking or swallowing" - relative to the reflux  . H/O hiatal hernia   . Hiatal hernia   . HLD (hyperlipidemia) 01/2010  . HTN (hypertension)    Controlled with 2 drug therapy  . Insomnia 07/27/2006  . Left leg DVT ([place]) 09/28/2012  . Migraine   . Pulmonary nodule 02/10/2014   CT 06/2015 shows stable pulm nodules compared to prior study 2015, so likely benign.     . 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 Severe aortic stenosis     Past Surgical History:  Procedure Laterality Date  .  [place] STUDY N/A 08/24/2017   Procedure: 24 HOUR PH STUDY IMPEDANCE;  Surgeon: Doran Stabler, MD;  Location: [facility] END[facility]COPY;  Service: Gastroenterology;  Laterality: N/A;  . ABDOMINAL HYSTERECTOMY    . APPENDECTOMY    . CARDIAC CATHETERIZATION    . CATARACT EXTRACTION     left eye  . COLONOSCOPY  2000&2005   Dr.Magod  . DIRECT LARYNGOSCOPY   September 2008    preoperative diagnosis hoarseness with anterior right vocal cord lesion -  direct laryngoscopy and excisional biopsy of right anterior vocal cord lesion done by Dr. Lucia Gaskins  . ENDOVENOUS ABLATION SAPHENOUS VEIN W/ LASER Left 05-09-2014   EVLA left small saphenous vein by Curt Jews MD  .  ESOPHAGEAL MANOMETRY N/A 08/24/2017   Procedure: ESOPHAGEAL MANOMETRY (EM);  Surgeon: Doran Stabler, MD;  Location: [facility] END[facility];  Service: Gastroenterology;  Laterality: N/A;  . ESOPHAGOGASTRODUODENOSCOPY  11/2010  . ESOPHAGOGASTRODUODENOSCOPY N/A 09/17/2012   Procedure: ESOPHAGOGASTRODUODENOSCOPY (EGD);  Surgeon: Lafayette Dragon, MD;  Location[facility] ENDOSCOPY;  Service: Endoscopy;  Laterality: N/A;  . ESOPHAGOGASTRODUODENOSCOPY N/A 12/17/2012   Procedure: ESOPHAGOGASTRODUODENOSCOPY (EGD);  Surgeon: Jerene Bears, MD;  Location: [place];  Service: Gastroenterology;  Laterality: N/A;  . EXCISION MASS HEAD N/A 03/05/2016   Procedure: EXCISION POSTERIOR SCALP MASS;  Surgeon: Ralene Ok, MD;  Location: [place];  Service: General;  Laterality: N/A;  . EYE SURGERY Bilateral    cataracts removed-   . HEMORRHOID SURGERY    . HIATAL HERNIA REPAIR N/A 12/01/2018   Procedure: LAPAROSCOPIC TAKEDOWN HIATAL HERNIA NISEEN REDUCTION AND UPPER ENDOSCOPY;  Surgeon: Johnathan Hausen, MD;  Location: [facility]S;  Service: General;  Laterality: N/A;  . KNEE ARTHROSCOPY    . LAPAROSCOPIC NISSEN FUNDOPLICATION N/A 5/0/3546   Procedure: LAPAROSCOPIC NISSEN FUNDOPLICATION;  Surgeon: [name], MD;  Location: [facility];  Service: General;  Laterality: N/A;  . MENISCECTOMY   July 2002    preoperative diagnosis torn medial meniscus right knee, partial medial meniscectomy, debridement chondroplasty patellofemoral joint, done by Dr. French Ana  . POLYPECTOMY  2000   Dr.Magod  . RIGHT/LEFT HEART CATH AND CORONARY ANGIOGRAPHY N/A 04/06/2018   Procedure: RIGHT/LEFT HEART CATH AND CORONARY ANGIOGRAPHY;  Surgeon: Burnell Blanks, MD;  Location: [place] CV LAB;  Service: Cardiovascular;  Laterality: N/A;  . ROTATOR CUFF REPAIR  09/21/2011   rt shoulder  . TEE WITHOUT CARDIOVERSION N/A 05/30/2018   Procedure: TRANSESOPHAGEAL ECHOCARDIOGRAM (TEE);  Surgeon: Burnell Blanks, MD;  Location: [place];  Service: Open  Heart Surgery;  Laterality: N/A;  . TOOTH EXTRACTION Bilateral 04/05/2018   Procedure: Extraction of tooth #'s 6 and 29 with alveoloplasty and gross debridement of remaining teeth;  Surgeon: Lenn Cal, DDS;  Location: [place];  Service: Oral Surgery;  Laterality: Bilateral;  . TRANSCATHETER AORTIC VALVE REPLACEMENT, TRANSFEMORAL N/A 05/30/2018   Procedure: TRANSCATHETER AORTIC VALVE REPLACEMENT, TRANSFEMORAL - MEDTRONIC VALVE;  Surgeon: Burnell Blanks, MD;  Location: [place];  Service: Open Heart Surgery;  Laterality: N/A;    Current Medications: Current Outpatient Medications on File Prior to Visit  Medication Sig  . amLODipine (NORVASC) 10 MG tablet Take 1 tablet (10 mg total) by mouth daily.  . busPIRone (BUSPAR) 7.5 MG tablet Take 7.5 mg by mouth 2 (two) times daily.  Marland Kitchen DEXILANT 60 MG capsule TAKE 1 CAPSULE BY MOUTH EVERY DAY (Patient taking differently: Take 60 mg by mouth daily.)  . gabapentin (NEURONTIN) 100 MG capsule Take 100  mg by mouth 3 (three) times daily.  . meclizine (ANTIVERT) 12.5 MG tablet Take 12.5 mg by mouth 2 (two) times daily as needed for dizziness.  . venlafaxine (EFFEXOR) 37.5 MG tablet Take 1 tablet (37.5 mg total) by mouth 2 (two) times daily.  . famotidine (PEPCID) 20 MG tablet Take 1 tablet (20 mg total) by mouth 2 (two) times daily. (Patient taking differently: Take 20 mg by mouth daily as needed for heartburn. )   No current facility-administered medications on file prior to visit.     Allergies:   Aspirin   Social History   Socioeconomic History  . Marital status: Widowed    Spouse name: Not on file  . Number of children: 1  . Years of education: some college  . Highest education level: Not on file  Occupational History  . Occupation: Retired-worked in [place] Use  . Smoking status: Former Smoker    Years: 50.00    Types: Cigarettes    Quit date: 06/02/1983    Years since quitting: 37.3  . Smokeless tobacco: Never Used  Vaping  Use  . Vaping Use: Never used  Substance and Sexual Activity  . Alcohol use: No    Alcohol/week: 0.0 standard drinks    Comment: quit 24 years ago  . Drug use: No  . Sexual activity: Not Currently    Birth control/protection: Post-menopausal, None  Other Topics Concern  . 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 The patient nolonger drinks alcohol.   The patient is widowed.   Patient has an adopted daughter living in [place].   Education: some college.    Right handed   Social Determinants of Health   Financial Resource Strain: Not on file  Food Insecurity: Not on file  Transportation Needs: Not on file  Physical Activity: Not on file  Stress: Not on file  Social Connections: Not on file     Family History: The patient's family history includes Diabetes in her brother; Heart attack in her father and mother; Heart disease in her mother; Hypertension in her father and mother. There is no history of Stroke, Cancer, Colon cancer, Anesthesia problems, Hypotension, Malignant hyperthermia, Pseudochol deficiency, Stomach cancer, Rectal cancer, or Esophageal cancer.  ROS:   Please see the history of present illness. (+) Right chest pain and tenderness (+) Left LE pain/ache, and coldness (+) Lightheadedness All other systems are reviewed and negative.    EKGs/Labs/Other Studies Reviewed:    The following studies were reviewed today:  Echo 06/29/18 Normal LV EF, grade 2 DD, stable TAVR  Cath 04/06/18 No CAD, LVEDP normal, Severe AS  EKG:  EKG is personally reviewed.   10/01/2020: NSR at 60 bpm 06/08/18: NSR  Recent Labs: No results found for requested labs  within last 8760 hours.  Recent Lipid Panel    Component Value Date/Time   CHOL 220 (H) 06/25/2016 0251   TRIG 44 06/25/2016 0251   HDL 93 06/25/2016 0251   CHOLHDL 2.4 06/25/2016 0251   VLDL 9 06/25/2016 0251   LDLCALC 118 (H) 06/25/2016 0251    Physical Exam:  VS:  BP (!) 151/75 (BP Location: Right Arm, Patient Position: Sitting)   Pulse 60   Ht _0  (1.499 m)   Wt 111 lb 9.6 oz (50.6 kg)   LMP 04/26/1950   SpO2 99%   BMI 22.54 kg/m     Wt Readings from Last 3 Encounters:  10/01/20 111 lb 9.6 oz (50.6 kg)  08/15/20 110 lb 3.2 oz (50 kg)  12/28/18 112 lb 8 oz (51 kg)     GEN: Well nourished, well developed in no acute distress HEENT: Normal NECK: No JVD; No carotid bruits CARDIAC: regular rhythm, normal S1 and S2, no rubs, gallops. 1/6 systolic ejection murmur.  VASCULAR: Radial and DP pulses 2+ bilaterally. Toes warm, good capillary refill bilaterally RESPIRATORY:  Clear to auscultation without rales, wheezing or rhonchi  ABDOMEN: Soft, non-tender, non-distended MUSCULOSKELETAL:  No edema; No deformity. Focal extreme tenderness to palpation over xiphoid process, with sensation of firm focal tissue or other that moves just below the skin. Surgical scar at the site SKIN: Warm and dry NEUROLOGIC:  Alert and oriented x 3 PSYCHIATRIC:  Normal affect   ASSESSMENT:    1. Atypical chest pain   2. Primary hypertension   3. Pain in both lower extremities   4. S/P TAVR (transcatheter aortic valve replacement)   5. Cardiac risk counseling    PLAN:    Atypical chest pain: focal, extreme tenderness just below skin, with firm areas that move when palpated -not cardiac -I reviewed prior CXR, no clear etiology -at surgical scar site, ?whether this was where her laparoscopic surgery was in 11/2018 -recommend topical treatment, try lidocaine cream -if remains severe, would have her return to PCP for imaging of the area  Shortness of breath: random, no clear etiology.  Euvolemic on exam. -monitor -suspect with her extreme tenderness to palpation that she would not tolerate echo, but low suspicion, will hold on evaluation at this time  Leg discomfort: -palpable pulses bilaterally, warm, well perfused -low suspicion for a vascular cause  Severe AS s/p TAVR 05/2018: followed by Angelena Form and Dr. Angelena Form. Doing well. Last echo with normal valve. -has SBE prophylaxis with amoxicillin -was on clopidogrel (aspirin allergy), stopped prior to surgery in 2020.   HTN: does not check at home. Elevated today but she is in pain -continue amlodipine 10 mg  Has a history of HLD (no recent lipids), but no CAD on cath. Given age, will not pursue statin at this time  Plan for follow up: 6 months or sooner PRN. She will contact me with any new concerns.  Medication Adjustments/Labs and Tests Ordered: Current medicines are reviewed at length with the patient today.  Concerns regarding medicines are outlined above.  Orders Placed This Encounter  Procedures  . EKG 12-Lead   No orders of the defined types were placed in this encounter.  Patient Instructions  Medication Instructions:  Your Physician recommend you continue on your current medication as directed.    Ok to try over the counter lidocaine   *If you need a refill on your cardiac medications before your next appointment, please call your pharmacy*   Lab Work: None ordered today   Testing/Procedures: None ordered today   Follow-Up: At [facility], you and your health needs are our priority.  As part of our continuing mission to provide you with exceptional heart care, we have created designated Provider Care Teams.  These Care Teams include your primary Cardiologist (physician) and Advanced Practice Providers (APPs -  Physician Assistants and Nurse Practitioners) who all work together to provide you with the care you need, when you need it.  We recommend signing up for the patient portal  called "MyChart".  Sign up information is provided on this After Visit Summary.  MyChart is used to connect with patients for Virtual Visits (Telemedicine).  Patients are able to view lab/test results, encounter notes, upcoming appointments, etc.  Non-urgent messages can be sent to your provider as well.   To learn more about what you can do with MyChart, go to [email or web address].    Your next appointment:   6 month(s) @ [address] [place], [place] [address]   The format for your next appointment:   In Person  Provider:   Buford Dresser, MD       [facility] Stumpf,acting as a scribe for Buford Dresser, MD.,have documented all relevant documentation on the behalf of Buford Dresser, MD,as directed by  Buford Dresser, MD while in the presence of Buford Dresser, MD.  I, Buford Dresser, MD, have reviewed all documentation for this visit. The documentation on 10/01/20 for the exam, diagnosis, procedures, and orders are all accurate and complete.  Signed, Buford Dresser, MD PhD 10/01/2020 1:31 PM    [facility]

## 2020-09-30 NOTE — Telephone Encounter (Signed)
Patient said   Pt c/o of Chest Pain: STAT if CP now or developed within 24 hours  1. Are you having CP right now? yes  2. Are you experiencing any other symptoms (ex. SOB, nausea, vomiting, sweating)? Nausea, dizziness  3. How long have you been experiencing CP? About a month   4. Is your CP continuous or coming and going? Comes and goes   5. Have you taken Nitroglycerin? no  Patient states the pain is mainly located around the area where she had a balloon in her valve done. Patient just went to her PCP and was told to come in to see her Heart Doctor asap ?

## 2020-09-30 NOTE — Telephone Encounter (Signed)
Spoke to patient she stated she has been having chest pain and sob off and on for the past 2 weeks.No chest pain at present. Stated she just saw Dr.Kim Cyndie Chime this morning and was advised to schedule appointment with Dr.Varanasi as soon as possible.Stated she has never seen Dr.Varanasi.She saw Dr.Christopher 10/2019.Appointment scheduled with Dr.Christopher 10/01/20 at 11:20 am.Advised to bring a list of all medications.Advised patient to go to ED if she has any more chest pain.  Dr.Nuguyen's office called I requested last office note and recent lab work to be faxed to office.

## 2020-10-01 ENCOUNTER — Encounter: Payer: Self-pay | Admitting: Cardiology

## 2020-10-01 ENCOUNTER — Other Ambulatory Visit: Payer: Self-pay

## 2020-10-01 ENCOUNTER — Ambulatory Visit (INDEPENDENT_AMBULATORY_CARE_PROVIDER_SITE_OTHER): Payer: Medicare Other | Admitting: Cardiology

## 2020-10-01 VITALS — BP 151/75 | HR 60 | Ht 59.0 in | Wt 111.6 lb

## 2020-10-01 DIAGNOSIS — R0789 Other chest pain: Secondary | ICD-10-CM | POA: Diagnosis not present

## 2020-10-01 DIAGNOSIS — Z952 Presence of prosthetic heart valve: Secondary | ICD-10-CM

## 2020-10-01 DIAGNOSIS — I10 Essential (primary) hypertension: Secondary | ICD-10-CM | POA: Diagnosis not present

## 2020-10-01 DIAGNOSIS — M79604 Pain in right leg: Secondary | ICD-10-CM

## 2020-10-01 DIAGNOSIS — M79605 Pain in left leg: Secondary | ICD-10-CM

## 2020-10-01 DIAGNOSIS — Z7189 Other specified counseling: Secondary | ICD-10-CM

## 2020-10-01 NOTE — Patient Instructions (Signed)
Medication Instructions:  Your Physician recommend you continue on your current medication as directed.    Ok to try over the counter lidocaine   *If you need a refill on your cardiac medications before your next appointment, please call your pharmacy*   Lab Work: None ordered today   Testing/Procedures: None ordered today   Follow-Up: At [facility], you and your health needs are our priority.  As part of our continuing mission to provide you with exceptional heart care, we have created designated Provider Care Teams.  These Care Teams include your primary Cardiologist (physician) and Advanced Practice Providers (APPs -  Physician Assistants and Nurse Practitioners) who all work together to provide you with the care you need, when you need it.  We recommend signing up for the patient portal called "MyChart".  Sign up information is provided on this After Visit Summary.  MyChart is used to connect with patients for Virtual Visits (Telemedicine).  Patients are able to view lab/test results, encounter notes, upcoming appointments, etc.  Non-urgent messages can be sent to your provider as well.   To learn more about what you can do with MyChart, go to [email or web address].    Your next appointment:   6 month(s) @ [address] [place], [place] [address]   The format for your next appointment:   In Person  Provider:   Jodelle Red, MD

## 2021-02-17 ENCOUNTER — Ambulatory Visit: Payer: Medicare Other | Admitting: Neurology

## 2021-05-15 ENCOUNTER — Ambulatory Visit (INDEPENDENT_AMBULATORY_CARE_PROVIDER_SITE_OTHER): Payer: Medicare Other | Admitting: Cardiology

## 2021-05-15 ENCOUNTER — Encounter (HOSPITAL_BASED_OUTPATIENT_CLINIC_OR_DEPARTMENT_OTHER): Payer: Self-pay | Admitting: Cardiology

## 2021-05-15 ENCOUNTER — Other Ambulatory Visit: Payer: Self-pay

## 2021-05-15 VITALS — BP 126/68 | HR 46 | Ht 59.0 in | Wt 115.0 lb

## 2021-05-15 DIAGNOSIS — R600 Localized edema: Secondary | ICD-10-CM | POA: Diagnosis not present

## 2021-05-15 DIAGNOSIS — Z952 Presence of prosthetic heart valve: Secondary | ICD-10-CM

## 2021-05-15 DIAGNOSIS — Z7189 Other specified counseling: Secondary | ICD-10-CM

## 2021-05-15 DIAGNOSIS — I10 Essential (primary) hypertension: Secondary | ICD-10-CM

## 2021-05-15 NOTE — Progress Notes (Signed)
Cardiology Office Note:    Date:  05/15/2021   ID:  TELISSA PALMISANO, DOB July 09, 1932, MRN [id number]  PCP:  Arthur Holms, NP  Cardiologist:  Buford Dresser, MD   Referring MD: Arthur Holms NP  CC: follow up  History of Present Illness:    Sherri Mcmillan is a 88 y.o. female with a hx of severe AS s/p TAVR, hiatal hernia/Barrett's esophagus, history of DVT, HTN, HLD who is seen for follow up. I initially met her 10/17/2018 for preoperative evaluation. She was followed by Dr. Angelena Form and Angelena Form post TAVR.  Today: She is accompanied by her close friend/pastor today. Overall, she is not feeling well.  Sometimes she continues to have chest tenderness and shortness of breath that is largely unchanged from prior.  Also, she suffers from dizziness that she describes as "room-spinning," which may last for a few minutes.  There is erythematous LLE edema that is constantly causing her pain. She believes this may be related to previous thrombi in the same area. The pain is also causing her to have difficulty sleeping.   She reports that she is losing her eyesight in her left eye.  She denies any palpitations, headaches, syncope, orthopnea, or PND. No chills or fevers.    Past Medical History:  Diagnosis Date   Anemia 12/14/2012   Ataxia 06/24/2016   Auditory hallucinations 08/19/2010   Has has auditory hallucinations, which seem to have worsened since death of her sister 01-29-10. Admission to [facility] (90s or early 2000)  for 6 weeks after mother died, also with hallucinations.  B/C of severe mental illness and refusal to see pysch, I have refused to refill controlled meds. If she decides to pursue mental health, then she needs to take the initiative and sch the appt bc Lorie Phenix has s   Barrett's esophagus    Demonstrated on EGD 2011/01/30. EGD 09/17/2012 shows inflamed GE junctional mucosa without metaplasia, dysplasia, or malignancy.   Cataract    Chronic anxiety    Admission to [facility]  (90s or early 2000)  for 6 weeks after mother died. Complicated again by death of her sister 2010. 2012 developed hallucinations and I refused to refill controlled meds unless she see psych which she is not agreable to   Chronic insomnia    Depression    DVT (deep venous thrombosis) ([place])    Gastroparesis    Demonstrated on GES 01/30/2011 by Dr Karmen Stabs   GERD (gastroesophageal reflux disease)    describes " I sometimes have a hard time talking or swallowing" - relative to the reflux   H/O hiatal hernia    Hiatal hernia    HLD (hyperlipidemia) 01/2010   HTN (hypertension)    Controlled with 2 drug therapy   Insomnia 07/27/2006   Left leg DVT ([place]) 09/28/2012   Migraine    Pulmonary nodule 02/10/2014   CT 06/2015 shows stable pulm nodules compared to prior study 2015, so likely benign.      S/P TAVR (transcatheter aortic valve replacement) 05/30/2018   s/p 26 mm Medtronic Evolut Pro Plus via TF approach on 05/30/18   Severe aortic stenosis     Past Surgical History:  Procedure Laterality Date   24 HOUR Agency STUDY N/A 08/24/2017   Procedure: 24 HOUR PH STUDY IMPEDANCE;  Surgeon: Doran Stabler, MD;  Location: [facility];  Service: Gastroenterology;  Laterality: N/A;   ABDOMINAL HYSTERECTOMY     APPENDECTOMY     CARDIAC CATHETERIZATION  CATARACT EXTRACTION     left eye   COLONOSCOPY  2000&2005   Dr.Magod   DIRECT LARYNGOSCOPY   September 2008    preoperative diagnosis hoarseness with anterior right vocal cord lesion -  direct laryngoscopy and excisional biopsy of right anterior vocal cord lesion done by Dr. Lucia Gaskins   ENDOVENOUS ABLATION SAPHENOUS VEIN W/ LASER Left 05-09-2014   EVLA left small saphenous vein by Curt Jews MD   ESOPHAGEAL MANOMETRY N/A 08/24/2017   Procedure: ESOPHAGEAL MANOMETRY (EM);  Surgeon: Doran Stabler, MD;  Location: [facility];  Service: Gastroenterology;  Laterality: N/A;   ESOPHAGOGASTRODUODENOSCOPY  11/2010   ESOPHAGOGASTRODUODENOSCOPY N/A 09/17/2012    Procedure: ESOPHAGOGASTRODUODENOSCOPY (EGD);  Surgeon: Lafayette Dragon, MD;  Location: [facility] ENDOSCOPY;  Service: Endoscopy;  Laterality: N/A;   ESOPHAGOGASTRODUODENOSCOPY N/A 12/17/2012   Procedure: ESOPHAGOGASTRODUODENOSCOPY (EGD);  Surgeon: Jerene Bears, MD;  Location: [place];  Service: Gastroenterology;  Laterality: N/A;   EXCISION MASS HEAD N/A 03/05/2016   Procedure: EXCISION POSTERIOR SCALP MASS;  Surgeon: Ralene Ok, MD;  Location: [place];  Service: General;  Laterality: N/A;   EYE SURGERY Bilateral    cataracts removed-    HEMORRHOID SURGERY     HIATAL HERNIA REPAIR N/A 12/01/2018   Procedure: LAPAROSCOPIC TAKEDOWN HIATAL HERNIA NISEEN REDUCTION AND UPPER ENDOSCOPY;  Surgeon: Johnathan Hausen, MD;  Location: [facility];  Service: General;  Laterality: N/A;   KNEE ARTHROSCOPY     LAPAROSCOPIC NISSEN FUNDOPLICATION N/A 05/05/5091   Procedure: LAPAROSCOPIC NISSEN FUNDOPLICATION;  Surgeon: [name], MD;  Location: [facility];  Service: General;  Laterality: N/A;   MENISCECTOMY   July 2002    preoperative diagnosis torn medial meniscus right knee, partial medial meniscectomy, debridement chondroplasty patellofemoral joint, done by Dr. French Ana   POLYPECTOMY  2000   Dr.Magod   RIGHT/LEFT HEART CATH AND CORONARY ANGIOGRAPHY N/A 04/06/2018   Procedure: RIGHT/LEFT HEART CATH AND CORONARY ANGIOGRAPHY;  Surgeon: Burnell Blanks, MD;  Location: [place] CV LAB;  Service: Cardiovascular;  Laterality: N/A;   ROTATOR CUFF REPAIR  09/21/2011   rt shoulder   TEE WITHOUT CARDIOVERSION N/A 05/30/2018   Procedure: TRANSESOPHAGEAL ECHOCARDIOGRAM (TEE);  Surgeon: Burnell Blanks, MD;  Location: [place];  Service: Open Heart Surgery;  Laterality: N/A;   TOOTH EXTRACTION Bilateral 04/05/2018   Procedure: Extraction of tooth #'s 6 and 29 with alveoloplasty and gross debridement of remaining teeth;  Surgeon: Lenn Cal, DDS;  Location: [place];  Service: Oral Surgery;  Laterality: Bilateral;    TRANSCATHETER AORTIC VALVE REPLACEMENT, TRANSFEMORAL N/A 05/30/2018   Procedure: TRANSCATHETER AORTIC VALVE REPLACEMENT, TRANSFEMORAL - MEDTRONIC VALVE;  Surgeon: Burnell Blanks, MD;  Location: [place];  Service: Open Heart Surgery;  Laterality: N/A;    Current Medications: Current Outpatient Medications on File Prior to Visit  Medication Sig   amLODipine (NORVASC) 10 MG tablet Take 1 tablet (10 mg total) by mouth daily.   DEXILANT 60 MG capsule TAKE 1 CAPSULE BY MOUTH EVERY DAY (Patient taking differently: Take 60 mg by mouth daily.)   gabapentin (NEURONTIN) 100 MG capsule Take 100 mg by mouth 3 (three) times daily.   venlafaxine (EFFEXOR) 37.5 MG tablet Take 1 tablet (37.5 mg total) by mouth 2 (two) times daily.   No current facility-administered medications on file prior to visit.     Allergies:   Aspirin   Social History   Socioeconomic History   Marital status: Widowed    Spouse name: Not on file  Number of children: 1   Years of education: some college   Highest education level: Not on file  Occupational History   Occupation: Retired-worked in [place] Use   Smoking status: Former    Years: 50.00    Types: Cigarettes    Quit date: 06/02/1983    Years since quitting: 37.9   Smokeless tobacco: Never  Vaping Use   Vaping Use: Never used  Substance and Sexual Activity   Alcohol use: No    Alcohol/week: 0.0 standard drinks    Comment: quit 24 years ago   Drug use: No   Sexual activity: Not Currently    Birth control/protection: Post-menopausal, None  Other Topics Concern   Not on file  Social History Narrative   Volunteers at middle school to be 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 The patient nolonger  drinks alcohol.   The patient is widowed.   Patient has an adopted daughter living in [place].   Education: some college.    Right handed   Social Determinants of Health   Financial Resource Strain: Not on file  Food Insecurity: Not on file  Transportation Needs: Not on file  Physical Activity: Not on file  Stress: Not on file  Social Connections: Not on file     Family History: The patient's family history includes Diabetes in her brother; Heart attack in her father and mother; Heart disease in her mother; Hypertension in her father and mother. There is no history of Stroke, Cancer, Colon cancer, Anesthesia problems, Hypotension, Malignant hyperthermia, Pseudochol deficiency, Stomach cancer, Rectal cancer, or Esophageal cancer.  ROS:   Please see the history of present illness. (+) Vision loss, left eye (+) Chest tenderness (+) Shortness of breath (+) Dizziness (+) LLE edema, pain All other systems are reviewed and negative.    EKGs/Labs/Other Studies Reviewed:    The following studies were reviewed today:  Left LE Venous Doppler 12/06/2019 ([facility]): FINDINGS: Flow is present in all interrogated vessels and there are no internal echoes.  Normal venous compressibility is demonstrated and there is augmentation of flow with appropriate maneuvers.     INCIDENTAL FINDINGS:  Edema is confirmed at the ankle.   IMPRESSION:  No evidence of deep venous thrombosis.   Echo 06/29/18 Normal LV EF, grade 2 DD, stable TAVR  Cath 04/06/18 No CAD, LVEDP normal, Severe AS  EKG:  EKG is personally reviewed.   05/15/2021: sinus bradycardia at 46 bpm 10/01/2020: NSR at 60 bpm 06/08/18: NSR  Recent Labs: No results found for requested labs within last 8760 hours.   Recent Lipid Panel    Component Value Date/Time   CHOL 220 (H) 06/25/2016 0251   TRIG 44 06/25/2016 0251   HDL 93 06/25/2016 0251   CHOLHDL 2.4 06/25/2016 0251   VLDL 9 06/25/2016 0251   LDLCALC 118 (H) 06/25/2016 0251     Physical Exam:    VS:  BP 126/68 (BP Location: Left Arm, Patient Position: Sitting, Cuff Size: Normal)    Pulse (!) 46    Ht 4' 11"  (1.499 m)    Wt 115 lb (52.2 kg)    LMP 04/26/1950    BMI 23.23 kg/m  Wt Readings from Last 3 Encounters:  05/15/21 115 lb (52.2 kg)  10/01/20 111 lb 9.6 oz (50.6 kg)  08/15/20 110 lb 3.2 oz (50 kg)     GEN: Well nourished, well developed in no acute distress HEENT: Normal NECK: No JVD; No carotid bruits CARDIAC: regular rhythm, normal S1 and S2, no rubs, gallops. 1/6 systolic ejection murmur.  VASCULAR: Radial and DP pulses 2+ bilaterally.  RESPIRATORY:  Rhonchi due to phlegm cleared up with cough, without rales, wheezing ABDOMEN: Soft, non-tender, non-distended MUSCULOSKELETAL:  No edema; No deformity.  SKIN: Warm and dry. Left LE with trivial focal edema, slightly erythematous but not warm, no discharge, tender to light touch NEUROLOGIC:  Alert and oriented x 3 PSYCHIATRIC:  Normal affect   ASSESSMENT:    1. Primary hypertension   2. S/P TAVR (transcatheter aortic valve replacement)   3. Edema of left lower leg   4. Cardiac risk counseling     PLAN:    Shortness of breath: improved today. Euvolemic on exam. -monitor -suspect with her extreme tenderness to palpation that she would not tolerate echo, but low suspicion, will hold on evaluation at this time  Leg discomfort, LLE -palpable pulses bilaterally, warm, well perfused -low suspicion for a vascular cause -prior ultrasound unremarkable, same symptoms, focal edema noted -suspect component of venous insufficiency, but given tenderness she cannot tolerate compression stockings -continue to monitor  Severe AS s/p TAVR 05/2018: followed by Angelena Form and Dr. Angelena Form. Doing well. Last echo with normal valve. -has SBE prophylaxis with amoxicillin -was on clopidogrel (aspirin allergy), stopped prior to surgery in 2020.   HTN: does not check at home. At goal today. -continue  amlodipine 10 mg  Not discussed in detail today: Has a history of HLD, but no CAD on cath. Given age, will not pursue statin at this time  Atypical chest pain: previously focal, extreme tenderness just below skin, with firm areas that move when palpated  Plan for follow up: 6 months or sooner PRN. She would like to be seen at the [facility] office. I will reach out to my colleagues there. She will contact me with any new concerns.  Medication Adjustments/Labs and Tests Ordered: Current medicines are reviewed at length with the patient today.  Concerns regarding medicines are outlined above.   Orders Placed This Encounter  Procedures   EKG 12-Lead   No orders of the defined types were placed in this encounter.  Patient Instructions  Medication Instructions:  Your Physician recommend you continue on your current medication as directed.    *If you need a refill on your cardiac medications before your next appointment, please call your pharmacy*   Lab Work: Please have primary care fax recent lab work to [phone]   Testing/Procedures: None ordered today   Follow-Up: At [facility], you and your health needs are our priority.  As part of our continuing mission to provide you with exceptional heart care, we have created designated Provider Care Teams.  These Care Teams include your primary Cardiologist (physician) and Advanced Practice Providers (APPs -  Physician Assistants and Nurse Practitioners) who all work together to provide you with the care you need, when you need it.  We recommend signing up for the patient portal called "MyChart".  Sign up information is provided on this After Visit Summary.  MyChart is used to connect with patients for Virtual Visits (Telemedicine).  Patients are able to view lab/test results, encounter notes, upcoming appointments, etc.  Non-urgent  messages can be sent to your provider as well.   To learn more about what you can do with MyChart, go  to [email or web address].    Your next appointment:   6 month(s)  The format for your next appointment:   In Person  Provider:   Dr. Delmar Landau Donalds as a scribe for Buford Dresser, MD.,have documented all relevant documentation on the behalf of Buford Dresser, MD,as directed by  Buford Dresser, MD while in the presence of Buford Dresser, MD.  I, Buford Dresser, MD, have reviewed all documentation for this visit. The documentation on 05/15/21 for the exam, diagnosis, procedures, and orders are all accurate and complete.  Signed, Buford Dresser, MD PhD 05/15/2021 6:43 PM    [place]

## 2021-05-15 NOTE — Patient Instructions (Signed)
Medication Instructions:  Your Physician recommend you continue on your current medication as directed.    *If you need a refill on your cardiac medications before your next appointment, please call your pharmacy*   Lab Work: Please have primary care fax recent lab work to [phone]   Testing/Procedures: None ordered today   Follow-Up: At [facility], you and your health needs are our priority.  As part of our continuing mission to provide you with exceptional heart care, we have created designated Provider Care Teams.  These Care Teams include your primary Cardiologist (physician) and Advanced Practice Providers (APPs -  Physician Assistants and Nurse Practitioners) who all work together to provide you with the care you need, when you need it.  We recommend signing up for the patient portal called "MyChart".  Sign up information is provided on this After Visit Summary.  MyChart is used to connect with patients for Virtual Visits (Telemedicine).  Patients are able to view lab/test results, encounter notes, upcoming appointments, etc.  Non-urgent messages can be sent to your provider as well.   To learn more about what you can do with MyChart, go to [email or web address].    Your next appointment:   6 month(s)  The format for your next appointment:   In Person  Provider:   Dr. Shari Prows

## 2021-06-04 ENCOUNTER — Encounter (HOSPITAL_BASED_OUTPATIENT_CLINIC_OR_DEPARTMENT_OTHER): Payer: Self-pay

## 2021-07-20 NOTE — Progress Notes (Incomplete)
?Cardiology Office Note:   ? ?Date:  07/20/2021  ? ?ID:  Sherri Mcmillan, DOB 02/21/1933, MRN [id number] ? ?PCP:  Arthur Holms, NP  ?Cardiologist:  Buford Dresser, MD  ? ?Referring MD: Arthur Holms NP ? ?CC: follow up ? ?History of Present Illness:   ? ?Sherri Mcmillan is a 88 y.o. female with a hx of severe AS s/p TAVR, hiatal hernia/Barrett's esophagus, history of DVT, HTN, HLD who is seen for follow up. I initially met her 10/17/2018 for preoperative evaluation. She was followed by Dr. Angelena Form and Angelena Form post TAVR. ? ?Today: ?She is accompanied by her close friend/pastor today. Overall, she is not feeling well. ? ?Sometimes she continues to have chest tenderness and shortness of breath that is largely unchanged from prior. ? ?Also, she suffers from dizziness that she describes as "room-spinning," which may last for a few minutes. ? ?There is erythematous LLE edema that is constantly causing her pain. She believes this may be related to previous thrombi in the same area. The pain is also causing her to have difficulty sleeping.  ? ?She reports that she is losing her eyesight in her left eye. ? ?She denies any palpitations, headaches, syncope, orthopnea, or PND. No chills or fevers. ? ? ? ?Past Medical History:  ?Diagnosis Date  ? Anemia 12/14/2012  ? Ataxia 06/24/2016  ? Auditory hallucinations 08/19/2010  ? Has has auditory hallucinations, which seem to have worsened since death of her sister 2010-01-22. Admission to [facility] (90s or early 2000)  for 6 weeks after mother died, also with hallucinations.  B/C of severe mental illness and refusal to see pysch, I have refused to refill controlled meds. If she decides to pursue mental health, then she needs to take the initiative and sch the appt bc Lorie Phenix has s  ? Barrett's esophagus   ? Demonstrated on EGD 01/23/2011. EGD 09/17/2012 shows inflamed GE junctional mucosa without metaplasia, dysplasia, or malignancy.  ? Cataract   ? Chronic anxiety   ? Admission to [facility]  (90s or early 2000)  for 6 weeks after mother died. Complicated again by death of her sister 2010. 2012 developed hallucinations and I refused to refill controlled meds unless she see psych which she is not agreable to  ? Chronic insomnia   ? Depression   ? DVT (deep venous thrombosis) ([place])   ? Gastroparesis   ? Demonstrated on GES 01/23/11 by Dr Karmen Stabs  ? GERD (gastroesophageal reflux disease)   ? describes " I sometimes have a hard time talking or swallowing" - relative to the reflux  ? H/O hiatal hernia   ? Hiatal hernia   ? HLD (hyperlipidemia) 01/2010  ? HTN (hypertension)   ? Controlled with 2 drug therapy  ? Insomnia 07/27/2006  ? Left leg DVT ([place]) 09/28/2012  ? Migraine   ? Pulmonary nodule 02/10/2014  ? CT 06/2015 shows stable pulm nodules compared to prior study 2015, so likely benign.     ? S/P TAVR (transcatheter aortic valve replacement) 05/30/2018  ? s/p 26 mm Medtronic Evolut Pro Plus via TF approach on 05/30/18  ? Severe aortic stenosis   ? ? ?Past Surgical History:  ?Procedure Laterality Date  ? 85 HOUR [place] STUDY N/A 08/24/2017  ? Procedure: 24 HOUR PH STUDY IMPEDANCE;  Surgeon: Doran Stabler, MD;  [facility];  Service: Gastroenterology;  Laterality: N/A;  ? ABDOMINAL HYSTERECTOMY    ? APPENDECTOMY    ? CARDIAC CATHETERIZATION    ?  CATARACT EXTRACTION    ? left eye  ? COLONOSCOPY  2000&2005  ? Dr.Magod  ? DIRECT LARYNGOSCOPY   September 2008  ?  preoperative diagnosis hoarseness with anterior right vocal cord lesion -  direct laryngoscopy and excisional biopsy of right anterior vocal cord lesion done by Dr. Lucia Gaskins  ? ENDOVENOUS ABLATION SAPHENOUS VEIN W/ LASER Left 05-09-2014  ? EVLA left small saphenous vein by Curt Jews MD  ? ESOPHAGEAL MANOMETRY N/A 08/24/2017  ? Procedure: ESOPHAGEAL MANOMETRY (EM);  Surgeon: Doran Stabler, MD;  [facility];  Service: Gastroenterology;  Laterality: N/A;  ? ESOPHAGOGASTRODUODENOSCOPY  11/2010  ? ESOPHAGOGASTRODUODENOSCOPY N/A 09/17/2012  ?  Procedure: ESOPHAGOGASTRODUODENOSCOPY (EGD);  Surgeon: Lafayette Dragon, MD;  Location: [facility] ENDOSCOPY;  Service: Endoscopy;  Laterality: N/A;  ? ESOPHAGOGASTRODUODENOSCOPY N/A 12/17/2012  ? Procedure: ESOPHAGOGASTRODUODENOSCOPY (EGD);  Surgeon: Jerene Bears, MD;  Location: [place];  Service: Gastroenterology;  Laterality: N/A;  ? EXCISION MASS HEAD N/A 03/05/2016  ? Procedure: EXCISION POSTERIOR SCALP MASS;  Surgeon: Ralene Ok, MD;  Location: [place];  Service: General;  Laterality: N/A;  ? EYE SURGERY Bilateral   ? cataracts removed-   ? HEMORRHOID SURGERY    ? HIATAL HERNIA REPAIR N/A 12/01/2018  ? Procedure: LAPAROSCOPIC TAKEDOWN HIATAL HERNIA NISEEN REDUCTION AND UPPER ENDOSCOPY;  Surgeon: Johnathan Hausen, MD;  [facility];  Service: General;  Laterality: N/A;  ? KNEE ARTHROSCOPY    ? LAPAROSCOPIC NISSEN FUNDOPLICATION N/A 0/04/270  ? Procedure: LAPAROSCOPIC NISSEN FUNDOPLICATION;  Surgeon: [name], MD;  [facility];  Service: General;  Laterality: N/A;  ? MENISCECTOMY   July 2002  ?  preoperative diagnosis torn medial meniscus right knee, partial medial meniscectomy, debridement chondroplasty patellofemoral joint, done by Dr. French Ana  ? POLYPECTOMY  2000  ? Dr.Magod  ? RIGHT/LEFT HEART CATH AND CORONARY ANGIOGRAPHY N/A 04/06/2018  ? Procedure: RIGHT/LEFT HEART CATH AND CORONARY ANGIOGRAPHY;  Surgeon: Burnell Blanks, MD;  Location: [place] CV LAB;  Service: Cardiovascular;  Laterality: N/A;  ? ROTATOR CUFF REPAIR  09/21/2011  ? rt shoulder  ? TEE WITHOUT CARDIOVERSION N/A 05/30/2018  ? Procedure: TRANSESOPHAGEAL ECHOCARDIOGRAM (TEE);  Surgeon: Burnell Blanks, MD;  Location: [place];  Service: Open Heart Surgery;  Laterality: N/A;  ? TOOTH EXTRACTION Bilateral 04/05/2018  ? Procedure: Extraction of tooth #'s 6 and 29 with alveoloplasty and gross debridement of remaining teeth;  Surgeon: Lenn Cal, DDS;  Location: [place];  Service: Oral Surgery;  Laterality: Bilateral;   ? TRANSCATHETER AORTIC VALVE REPLACEMENT, TRANSFEMORAL N/A 05/30/2018  ? Procedure: TRANSCATHETER AORTIC VALVE REPLACEMENT, TRANSFEMORAL - MEDTRONIC VALVE;  Surgeon: Burnell Blanks, MD;  Location: [place];  Service: Open Heart Surgery;  Laterality: N/A;  ? ? ?Current Medications: ?Current Outpatient Medications on File Prior to Visit  ?Medication Sig  ? amLODipine (NORVASC) 10 MG tablet Take 1 tablet (10 mg total) by mouth daily.  ? DEXILANT 60 MG capsule TAKE 1 CAPSULE BY MOUTH EVERY DAY (Patient taking differently: Take 60 mg by mouth daily.)  ? gabapentin (NEURONTIN) 100 MG capsule Take 100 mg by mouth 3 (three) times daily.  ? venlafaxine (EFFEXOR) 37.5 MG tablet Take 1 tablet (37.5 mg total) by mouth 2 (two) times daily.  ? ?No current facility-administered medications on file prior to visit.  ?  ? ?Allergies:   Aspirin  ? ?Social History  ? ?Socioeconomic History  ? Marital status: Widowed  ?  Spouse name: Not on file  ?  Number of children: 1  ? Years of education: some college  ? Highest education level: Not on file  ?Occupational History  ? Occupation: Retired-worked in [place]  ?Tobacco Use  ? Smoking status: Former  ?  Years: 50.00  ?  Types: Cigarettes  ?  Quit date: 06/02/1983  ?  Years since quitting: 38.1  ? Smokeless tobacco: Never  ?Vaping Use  ? Vaping Use: Never used  ?Substance and Sexual Activity  ? Alcohol use: No  ?  Alcohol/week: 0.0 standard drinks  ?  Comment: quit 24 years ago  ? Drug use: No  ? Sexual activity: Not Currently  ?  Birth control/protection: Post-menopausal, None  ?Other Topics Concern  ? Not on file  ?Social History Narrative  ? 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 Sings with church and has several CDs  ? Her sister died on 01/17/10 and she is in the grieving process and trying to comfort her sisters kids  ? Patient quit smoking in 1985. The patient nolonger  drinks alcohol.  ? The patient is widowed.  ? Patient has an adopted daughter living in [place].  ? Education: some college.   ? Right handed  ? ?Social Determinants of Health  ? ?Financial Resource Strain

## 2021-07-22 ENCOUNTER — Ambulatory Visit (HOSPITAL_BASED_OUTPATIENT_CLINIC_OR_DEPARTMENT_OTHER): Payer: Medicare Other | Admitting: Cardiology

## 2021-07-28 ENCOUNTER — Ambulatory Visit (HOSPITAL_BASED_OUTPATIENT_CLINIC_OR_DEPARTMENT_OTHER): Payer: Medicare Other | Admitting: Family
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
## 2021-07-30 ENCOUNTER — Encounter (HOSPITAL_BASED_OUTPATIENT_CLINIC_OR_DEPARTMENT_OTHER): Payer: Self-pay | Admitting: Nurse Practitioner

## 2021-07-30 ENCOUNTER — Ambulatory Visit (INDEPENDENT_AMBULATORY_CARE_PROVIDER_SITE_OTHER): Payer: Medicare Other | Admitting: Nurse Practitioner

## 2021-07-30 ENCOUNTER — Telehealth (HOSPITAL_BASED_OUTPATIENT_CLINIC_OR_DEPARTMENT_OTHER): Payer: Self-pay

## 2021-07-30 ENCOUNTER — Ambulatory Visit (HOSPITAL_BASED_OUTPATIENT_CLINIC_OR_DEPARTMENT_OTHER): Payer: Self-pay

## 2021-07-30 VITALS — BP 140/77 | HR 54 | Ht 59.0 in | Wt 110.3 lb

## 2021-07-30 DIAGNOSIS — I480 Paroxysmal atrial fibrillation: Secondary | ICD-10-CM

## 2021-07-30 DIAGNOSIS — I10 Essential (primary) hypertension: Secondary | ICD-10-CM | POA: Diagnosis not present

## 2021-07-30 DIAGNOSIS — R9431 Abnormal electrocardiogram [ECG] [EKG]: Secondary | ICD-10-CM | POA: Diagnosis not present

## 2021-07-30 DIAGNOSIS — R600 Localized edema: Secondary | ICD-10-CM

## 2021-07-30 DIAGNOSIS — Z952 Presence of prosthetic heart valve: Secondary | ICD-10-CM

## 2021-07-30 LAB — D-DIMER, QUANTITATIVE: D-DIMER: 0.39 mg/L FEU (ref 0.00–0.49)

## 2021-07-30 MED ORDER — APIXABAN 2.5 MG PO TABS: 2.5000 mg | ORAL_TABLET | Freq: Two times a day (BID) | ORAL | 3 refills | Status: DC

## 2021-07-30 NOTE — Telephone Encounter (Signed)
Duplicate created in error

## 2021-07-30 NOTE — Telephone Encounter (Signed)
[facility] called to inform staff that D-dimer results is 0.39, consulted with NP and given okay to call and inform patient  ? ? ?Called, no answer, LM  ?

## 2021-07-30 NOTE — Patient Instructions (Signed)
Medication Instructions:  ?Your physician has recommended you make the following change in your medication:  ? ?Start: Eliquis 2.5mg  twice daily- please do not start taking this until Rutherford Guys, RN calls to tell you that you are clear.  ? ? ?*If you need a refill on your cardiac medications before your next appointment, please call your pharmacy* ? ? ?Lab Work: ?Your physician recommends that you return for lab work today- D-Dimer  ? ?If you have labs (blood work) drawn today and your tests are completely normal, you will receive your results only by: ?MyChart Message (if you have MyChart) OR ?A paper copy in the mail ?If you have any lab test that is abnormal or we need to change your treatment, we will call you to review the results. ? ? ?Testing/Procedures: ?Your physician has recommended that you wear a Zio monitor.  ? ?This monitor is a medical device that records the heart?s electrical activity. Doctors most often use these monitors to diagnose arrhythmias. Arrhythmias are problems with the speed or rhythm of the heartbeat. The monitor is a small device applied to your chest. You can wear one while you do your normal daily activities. While wearing this monitor if you have any symptoms to push the button and record what you felt. Once you have worn this monitor for the period of time provider prescribed (Usually 14 days), you will return the monitor device in the postage paid box. Once it is returned they will download the data collected and provide [place] with a report which the provider will then review and we will call you with those results. Important tips: ? ?Avoid showering during the first 24 hours of wearing the monitor. ?Avoid excessive sweating to help maximize wear time. ?Do not submerge the device, no hot tubs, and no swimming pools. ?Keep any lotions or oils away from the patch. ?After 24 hours you may shower with the patch on. Take brief showers with your back facing the shower head.  ?Do not remove  patch once it has been placed because that will interrupt data and decrease adhesive wear time. ?Push the button when you have any symptoms and write down what you were feeling. ?Once you have completed wearing your monitor, remove and place into box which has postage paid and place in your outgoing mailbox.  ?If for some reason you have misplaced your box then call our office and we can provide another box and/or mail it off for you. ? ? ?Follow-Up: ?At [facility], you and your health needs are our priority.  As part of our continuing mission to provide you with exceptional heart care, we have created designated Provider Care Teams.  These Care Teams include your primary Cardiologist (physician) and Advanced Practice Providers (APPs -  Physician Assistants and Nurse Practitioners) who all work together to provide you with the care you need, when you need it. ? ?We recommend signing up for the patient portal called "MyChart".  Sign up information is provided on this After Visit Summary.  MyChart is used to connect with patients for Virtual Visits (Telemedicine).  Patients are able to view lab/test results, encounter notes, upcoming appointments, etc.  Non-urgent messages can be sent to your provider as well.   ?To learn more about what you can do with MyChart, go to [email or web address].   ? ?Your next appointment:   ?6 week(s) ? ?The format for your next appointment:   ?In Person ? ?Provider:   ?Jodelle Red, MD{ ? ?

## 2021-08-04 ENCOUNTER — Other Ambulatory Visit: Payer: Self-pay | Admitting: Family Medicine

## 2021-08-04 ENCOUNTER — Ambulatory Visit
Admission: RE | Admit: 2021-08-04 | Discharge: 2021-08-04 | Disposition: A | Discharge status: Home / Self Care | Payer: Medicare Other | Source: Ambulatory Visit | Attending: Internal Medicine | Admitting: Internal Medicine

## 2021-08-04 DIAGNOSIS — R52 Pain, unspecified: Secondary | ICD-10-CM

## 2021-08-05 ENCOUNTER — Observation Stay (HOSPITAL_COMMUNITY)
Admission: EM | Admit: 2021-08-05 | Discharge: 2021-08-07 | Disposition: A | Discharge status: Home / Self Care | Payer: Medicare Other | Attending: Student | Admitting: Student

## 2021-08-05 ENCOUNTER — Encounter (HOSPITAL_COMMUNITY): Payer: Self-pay

## 2021-08-05 ENCOUNTER — Emergency Department (HOSPITAL_COMMUNITY): Payer: Medicare Other

## 2021-08-05 DIAGNOSIS — B952 Enterococcus as the cause of diseases classified elsewhere: Secondary | ICD-10-CM | POA: Diagnosis not present

## 2021-08-05 DIAGNOSIS — R112 Nausea with vomiting, unspecified: Secondary | ICD-10-CM | POA: Diagnosis not present

## 2021-08-05 DIAGNOSIS — Z952 Presence of prosthetic heart valve: Secondary | ICD-10-CM | POA: Insufficient documentation

## 2021-08-05 DIAGNOSIS — R935 Abnormal findings on diagnostic imaging of other abdominal regions, including retroperitoneum: Secondary | ICD-10-CM | POA: Insufficient documentation

## 2021-08-05 DIAGNOSIS — Z86718 Personal history of other venous thrombosis and embolism: Secondary | ICD-10-CM | POA: Diagnosis not present

## 2021-08-05 DIAGNOSIS — M545 Low back pain, unspecified: Secondary | ICD-10-CM

## 2021-08-05 DIAGNOSIS — R109 Unspecified abdominal pain: Secondary | ICD-10-CM | POA: Diagnosis present

## 2021-08-05 DIAGNOSIS — I480 Paroxysmal atrial fibrillation: Secondary | ICD-10-CM | POA: Diagnosis not present

## 2021-08-05 DIAGNOSIS — K219 Gastro-esophageal reflux disease without esophagitis: Secondary | ICD-10-CM | POA: Diagnosis present

## 2021-08-05 DIAGNOSIS — Z79899 Other long term (current) drug therapy: Secondary | ICD-10-CM | POA: Insufficient documentation

## 2021-08-05 DIAGNOSIS — N390 Urinary tract infection, site not specified: Secondary | ICD-10-CM | POA: Diagnosis not present

## 2021-08-05 DIAGNOSIS — I10 Essential (primary) hypertension: Secondary | ICD-10-CM | POA: Diagnosis not present

## 2021-08-05 DIAGNOSIS — R262 Difficulty in walking, not elsewhere classified: Secondary | ICD-10-CM | POA: Insufficient documentation

## 2021-08-05 DIAGNOSIS — R10A0 Flank pain, unspecified side: Secondary | ICD-10-CM

## 2021-08-05 DIAGNOSIS — F419 Anxiety disorder, unspecified: Secondary | ICD-10-CM | POA: Diagnosis present

## 2021-08-05 DIAGNOSIS — Z7901 Long term (current) use of anticoagulants: Secondary | ICD-10-CM | POA: Insufficient documentation

## 2021-08-05 DIAGNOSIS — Z87891 Personal history of nicotine dependence: Secondary | ICD-10-CM | POA: Diagnosis not present

## 2021-08-05 DIAGNOSIS — M47816 Spondylosis without myelopathy or radiculopathy, lumbar region: Secondary | ICD-10-CM | POA: Insufficient documentation

## 2021-08-05 LAB — CBC
HCT: 40.4 % (ref 36.0–46.0)
Hemoglobin: 13.3 g/dL (ref 12.0–15.0)
MCH: 30 pg (ref 26.0–34.0)
MCHC: 32.9 g/dL (ref 30.0–36.0)
MCV: 91 fL (ref 80.0–100.0)
Platelets: 158 10*3/uL (ref 150–400)
RBC: 4.44 MIL/uL (ref 3.87–5.11)
RDW: 12.9 % (ref 11.5–15.5)
WBC: 3.5 10*3/uL — ABNORMAL LOW (ref 4.0–10.5)
nRBC: 0 % (ref 0.0–0.2)

## 2021-08-05 LAB — URINALYSIS, ROUTINE W REFLEX MICROSCOPIC
Bilirubin Urine: NEGATIVE
Glucose, UA: NEGATIVE mg/dL
Hgb urine dipstick: NEGATIVE
Ketones, ur: NEGATIVE mg/dL
Nitrite: NEGATIVE
Protein, ur: NEGATIVE mg/dL
Specific Gravity, Urine: 1.023 (ref 1.005–1.030)
pH: 5 (ref 5.0–8.0)

## 2021-08-05 LAB — BASIC METABOLIC PANEL
Anion gap: 7 (ref 5–15)
BUN: 23 mg/dL (ref 8–23)
CO2: 26 mmol/L (ref 22–32)
Calcium: 9.6 mg/dL (ref 8.9–10.3)
Chloride: 108 mmol/L (ref 98–111)
Creatinine, Ser: 0.98 mg/dL (ref 0.44–1.00)
GFR, Estimated: 56 mL/min — ABNORMAL LOW (ref >60)
Glucose, Bld: 110 mg/dL — ABNORMAL HIGH (ref 70–99)
Potassium: 4 mmol/L (ref 3.5–5.1)
Sodium: 141 mmol/L (ref 135–145)

## 2021-08-05 MED ORDER — ONDANSETRON HCL 4 MG/2ML IJ SOLN
4.0000 mg | Freq: Once | INTRAMUSCULAR | Status: AC
  Administered 2021-08-05: 4 mg via INTRAVENOUS
  Filled 2021-08-05: qty 2

## 2021-08-05 MED ORDER — SODIUM CHLORIDE 0.9 % IV SOLN
1.0000 g | Freq: Once | INTRAVENOUS | Status: AC
  Administered 2021-08-05: 1 g via INTRAVENOUS
  Filled 2021-08-05: qty 10

## 2021-08-05 MED ORDER — SODIUM CHLORIDE 0.9 % IV BOLUS
500.0000 mL | Freq: Once | INTRAVENOUS | Status: AC
  Administered 2021-08-05: 500 mL via INTRAVENOUS

## 2021-08-05 MED ORDER — IOHEXOL 300 MG/ML  SOLN
100.0000 mL | Freq: Once | INTRAMUSCULAR | Status: AC | PRN
  Administered 2021-08-05: 100 mL via INTRAVENOUS

## 2021-08-05 MED ORDER — FENTANYL CITRATE PF 50 MCG/ML IJ SOSY
25.0000 ug | PREFILLED_SYRINGE | Freq: Once | INTRAMUSCULAR | Status: AC
  Administered 2021-08-05: 25 ug via INTRAVENOUS
  Filled 2021-08-05: qty 1
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
## 2021-08-06 ENCOUNTER — Telehealth: Payer: Self-pay | Admitting: Nurse Practitioner

## 2021-08-06 ENCOUNTER — Other Ambulatory Visit: Payer: Self-pay

## 2021-08-06 DIAGNOSIS — M545 Low back pain, unspecified: Secondary | ICD-10-CM

## 2021-08-06 DIAGNOSIS — N3000 Acute cystitis without hematuria: Secondary | ICD-10-CM | POA: Diagnosis not present

## 2021-08-06 DIAGNOSIS — I480 Paroxysmal atrial fibrillation: Secondary | ICD-10-CM

## 2021-08-06 DIAGNOSIS — K2100 Gastro-esophageal reflux disease with esophagitis, without bleeding: Secondary | ICD-10-CM | POA: Diagnosis not present

## 2021-08-06 DIAGNOSIS — I10 Essential (primary) hypertension: Secondary | ICD-10-CM

## 2021-08-06 DIAGNOSIS — F419 Anxiety disorder, unspecified: Secondary | ICD-10-CM

## 2021-08-06 DIAGNOSIS — R1084 Generalized abdominal pain: Secondary | ICD-10-CM

## 2021-08-06 DIAGNOSIS — R109 Unspecified abdominal pain: Secondary | ICD-10-CM

## 2021-08-06 DIAGNOSIS — R935 Abnormal findings on diagnostic imaging of other abdominal regions, including retroperitoneum: Secondary | ICD-10-CM

## 2021-08-06 DIAGNOSIS — F32A Depression, unspecified: Secondary | ICD-10-CM

## 2021-08-06 LAB — CBC
HCT: 39.2 % (ref 36.0–46.0)
Hemoglobin: 12.7 g/dL (ref 12.0–15.0)
MCH: 29.7 pg (ref 26.0–34.0)
MCHC: 32.4 g/dL (ref 30.0–36.0)
MCV: 91.8 fL (ref 80.0–100.0)
Platelets: 145 10*3/uL — ABNORMAL LOW (ref 150–400)
RBC: 4.27 MIL/uL (ref 3.87–5.11)
RDW: 12.9 % (ref 11.5–15.5)
WBC: 3.4 10*3/uL — ABNORMAL LOW (ref 4.0–10.5)
nRBC: 0 % (ref 0.0–0.2)

## 2021-08-06 LAB — COMPREHENSIVE METABOLIC PANEL
ALT: 11 U/L (ref 0–44)
AST: 28 U/L (ref 15–41)
Albumin: 3.9 g/dL (ref 3.5–5.0)
Alkaline Phosphatase: 46 U/L (ref 38–126)
Anion gap: 7 (ref 5–15)
BUN: 17 mg/dL (ref 8–23)
CO2: 25 mmol/L (ref 22–32)
Calcium: 9.1 mg/dL (ref 8.9–10.3)
Chloride: 107 mmol/L (ref 98–111)
Creatinine, Ser: 0.79 mg/dL (ref 0.44–1.00)
GFR, Estimated: >60 mL/min (ref >60)
Glucose, Bld: 94 mg/dL (ref 70–99)
Potassium: 3.7 mmol/L (ref 3.5–5.1)
Sodium: 139 mmol/L (ref 135–145)
Total Bilirubin: 0.8 mg/dL (ref 0.3–1.2)
Total Protein: 7 g/dL (ref 6.5–8.1)

## 2021-08-06 LAB — D-DIMER, QUANTITATIVE

## 2021-08-06 MED ORDER — ACETAMINOPHEN 325 MG PO TABS: 650.0000 mg | ORAL_TABLET | Freq: Four times a day (QID) | ORAL | Status: DC | PRN

## 2021-08-06 MED ORDER — LATANOPROST 0.005 % OP SOLN
1.0000 [drp] | Freq: Every day | OPHTHALMIC | Status: DC
  Administered 2021-08-06: 1 [drp] via OPHTHALMIC
  Filled 2021-08-06: qty 2.5

## 2021-08-06 MED ORDER — PANTOPRAZOLE SODIUM 40 MG PO TBEC
40.0000 mg | DELAYED_RELEASE_TABLET | Freq: Every day | ORAL | Status: DC
  Administered 2021-08-06 – 2021-08-07 (×2): 40 mg via ORAL
  Filled 2021-08-06 (×2): qty 1

## 2021-08-06 MED ORDER — METHOCARBAMOL 500 MG PO TABS
500.0000 mg | ORAL_TABLET | Freq: Three times a day (TID) | ORAL | Status: DC
  Administered 2021-08-06 – 2021-08-07 (×4): 500 mg via ORAL
  Filled 2021-08-06 (×4): qty 1

## 2021-08-06 MED ORDER — DICLOFENAC SODIUM 1 % EX GEL
2.0000 g | Freq: Four times a day (QID) | CUTANEOUS | Status: DC
  Administered 2021-08-06 – 2021-08-07 (×4): 2 g via TOPICAL
  Filled 2021-08-06: qty 100

## 2021-08-06 MED ORDER — OXYCODONE HCL 5 MG PO TABS
5.0000 mg | ORAL_TABLET | ORAL | Status: DC | PRN
  Administered 2021-08-06 – 2021-08-07 (×5): 10 mg via ORAL
  Filled 2021-08-06 (×5): qty 2

## 2021-08-06 MED ORDER — ALUM & MAG HYDROXIDE-SIMETH 200-200-20 MG/5ML PO SUSP
30.0000 mL | ORAL | Status: DC | PRN
  Administered 2021-08-06: 30 mL via ORAL
  Filled 2021-08-06: qty 30

## 2021-08-06 MED ORDER — ACETAMINOPHEN 650 MG RE SUPP: 650.0000 mg | Freq: Four times a day (QID) | RECTAL | Status: DC | PRN

## 2021-08-06 MED ORDER — KETOROLAC TROMETHAMINE 15 MG/ML IJ SOLN
15.0000 mg | Freq: Four times a day (QID) | INTRAMUSCULAR | Status: DC | PRN
  Administered 2021-08-06 (×2): 15 mg via INTRAVENOUS
  Filled 2021-08-06 (×2): qty 1

## 2021-08-06 MED ORDER — ONDANSETRON HCL 4 MG PO TABS: 4.0000 mg | ORAL_TABLET | Freq: Four times a day (QID) | ORAL | Status: DC | PRN

## 2021-08-06 MED ORDER — OXYCODONE HCL 5 MG PO TABS
5.0000 mg | ORAL_TABLET | ORAL | Status: DC | PRN
  Administered 2021-08-06: 5 mg via ORAL
  Filled 2021-08-06: qty 1

## 2021-08-06 MED ORDER — APIXABAN 2.5 MG PO TABS
2.5000 mg | ORAL_TABLET | Freq: Two times a day (BID) | ORAL | Status: DC
  Administered 2021-08-06 (×2): 2.5 mg via ORAL
  Filled 2021-08-06 (×3): qty 1

## 2021-08-06 MED ORDER — AMLODIPINE BESYLATE 10 MG PO TABS
10.0000 mg | ORAL_TABLET | Freq: Every day | ORAL | Status: DC
  Administered 2021-08-06: 10 mg via ORAL
  Filled 2021-08-06: qty 1

## 2021-08-06 MED ORDER — ALBUTEROL SULFATE (2.5 MG/3ML) 0.083% IN NEBU: 2.5000 mg | INHALATION_SOLUTION | Freq: Four times a day (QID) | RESPIRATORY_TRACT | Status: DC | PRN

## 2021-08-06 MED ORDER — SODIUM CHLORIDE 0.9 % IV SOLN
1.0000 g | INTRAVENOUS | Status: DC
  Administered 2021-08-06: 1 g via INTRAVENOUS
  Filled 2021-08-06 (×2): qty 10

## 2021-08-06 MED ORDER — ONDANSETRON HCL 4 MG/2ML IJ SOLN
4.0000 mg | Freq: Four times a day (QID) | INTRAMUSCULAR | Status: DC | PRN
  Administered 2021-08-06 – 2021-08-07 (×3): 4 mg via INTRAVENOUS
  Filled 2021-08-06 (×3): qty 2

## 2021-08-06 MED ORDER — DORZOLAMIDE HCL-TIMOLOL MAL 2-0.5 % OP SOLN
1.0000 [drp] | Freq: Two times a day (BID) | OPHTHALMIC | Status: DC
  Administered 2021-08-06 – 2021-08-07 (×3): 1 [drp] via OPHTHALMIC
  Filled 2021-08-06: qty 10
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
## 2021-08-06 NOTE — Assessment & Plan Note (Addendum)
Normotensive. ?-Continue home amlodipine ?

## 2021-08-06 NOTE — Assessment & Plan Note (Signed)
Continue PPI ?
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
## 2021-08-06 NOTE — Assessment & Plan Note (Addendum)
Ruled out.  Per PCP, urine culture with Enterococcus bacillus.  She was a started on p.o. Levaquin 250 mg daily based on culture sensitivity.  Reportedly took Levaquin for 3 days but could not continue due to nausea and vomiting.  However, patient denied ever having dysuria, frequency or urgency.  Her recent nausea and vomiting is after she started Levaquin.  She was also on tramadol and Eliquis that was started recently.  He abdominal exam is benign.  His urine culture is negative.  She received IV ceftriaxone x2 in house.  Antibiotics discontinued on discharge. ?
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
## 2021-08-06 NOTE — Telephone Encounter (Signed)
RN returned call to [place] at [facility]- Patient is to be taking Eliquis 2.5mg  BID, but was told to hold until D-Dimer came back negative. PharmD to follow up with patient  ?

## 2021-08-06 NOTE — Progress Notes (Signed)
Pt. Has legal guardian who is her paster and daughter in law. Please call her for any medical updates. Pt. Gets very overwhelmed with the calls and health care related changes and concerns.  ?
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
## 2021-08-06 NOTE — Assessment & Plan Note (Signed)
Questionable areas of colonic wall thickening involving the hepatic flexure, splenic flexure and descending colon noted on CT. suspected to be nondistention versus colitis.  Patient's abdominal exam is benign. ?-No further work-up indicated ?
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
## 2021-08-06 NOTE — Telephone Encounter (Signed)
?  Pt c/o medication issue: ? ?1. Name of Medication:  ? apixaban (ELIQUIS) 2.5 MG TABS tablet  ? ? ?2. How are you currently taking this medication (dosage and times per day)? Take 1 tablet (2.5 mg total) by mouth 2 (two) times daily.Patient not taking: Reported on 08/05/2021 ? ?3. Are you having a reaction (difficulty breathing--STAT)?  ? ?4. What is your medication issue? Errin PharmD at [facility] said, pt told them during pt's OV with PA Ambrose Pancoast, eliquis was prescribed, however, pt said, the PA changed his mind and advised pt not to take it. She would like to confirm if pt doesn't  need this prescription.  ? ?
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
## 2021-08-07 ENCOUNTER — Telehealth (HOSPITAL_BASED_OUTPATIENT_CLINIC_OR_DEPARTMENT_OTHER): Payer: Self-pay

## 2021-08-07 DIAGNOSIS — M545 Low back pain, unspecified: Secondary | ICD-10-CM | POA: Diagnosis not present

## 2021-08-07 DIAGNOSIS — R935 Abnormal findings on diagnostic imaging of other abdominal regions, including retroperitoneum: Secondary | ICD-10-CM | POA: Diagnosis not present

## 2021-08-07 DIAGNOSIS — R112 Nausea with vomiting, unspecified: Secondary | ICD-10-CM

## 2021-08-07 DIAGNOSIS — I480 Paroxysmal atrial fibrillation: Secondary | ICD-10-CM | POA: Diagnosis not present

## 2021-08-07 DIAGNOSIS — N390 Urinary tract infection, site not specified: Secondary | ICD-10-CM | POA: Diagnosis not present

## 2021-08-07 DIAGNOSIS — B952 Enterococcus as the cause of diseases classified elsewhere: Secondary | ICD-10-CM | POA: Diagnosis not present

## 2021-08-07 DIAGNOSIS — R1010 Upper abdominal pain, unspecified: Secondary | ICD-10-CM

## 2021-08-07 LAB — URINE CULTURE: Culture: NO GROWTH

## 2021-08-07 MED ORDER — AMLODIPINE BESYLATE 5 MG PO TABS: 5.0000 mg | ORAL_TABLET | Freq: Every day | ORAL | Status: DC

## 2021-08-07 MED ORDER — OXYCODONE HCL 5 MG PO TABS: 5.0000 mg | ORAL_TABLET | ORAL | Status: DC | PRN

## 2021-08-07 MED ORDER — SENNOSIDES-DOCUSATE SODIUM 8.6-50 MG PO TABS
1.0000 | ORAL_TABLET | Freq: Two times a day (BID) | ORAL | Status: DC | PRN
  Administered 2021-08-07: 1 via ORAL
  Filled 2021-08-07: qty 1

## 2021-08-07 MED ORDER — ACETAMINOPHEN 500 MG PO TABS: ORAL_TABLET | ORAL | 0 refills | Status: AC

## 2021-08-07 MED ORDER — POLYETHYLENE GLYCOL 3350 17 G PO PACK
17.0000 g | PACK | Freq: Two times a day (BID) | ORAL | Status: DC | PRN
  Administered 2021-08-07: 17 g via ORAL
  Filled 2021-08-07: qty 1

## 2021-08-07 MED ORDER — AMLODIPINE BESYLATE 5 MG PO TABS: 5.0000 mg | ORAL_TABLET | Freq: Every day | ORAL | 1 refills | Status: DC

## 2021-08-07 MED ORDER — LIDOCAINE 5 % EX PTCH: 1.0000 | MEDICATED_PATCH | Freq: Two times a day (BID) | CUTANEOUS | 0 refills | Status: AC

## 2021-08-07 MED ORDER — ONDANSETRON HCL 4 MG PO TABS: 4.0000 mg | ORAL_TABLET | Freq: Three times a day (TID) | ORAL | 0 refills | Status: DC | PRN

## 2021-08-07 MED ORDER — SENNOSIDES-DOCUSATE SODIUM 8.6-50 MG PO TABS: 1.0000 | ORAL_TABLET | Freq: Two times a day (BID) | ORAL | Status: DC | PRN
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
## 2021-08-07 NOTE — Progress Notes (Signed)
Chaplain attempted visit.  Patient was asleep but rousable.  When asked if she would like a visit, she said "please return when my daughter comes."  Chaplain requested unit nurse to page her if daughter comes today. ?Rev. [place]  ?Pager [phone] ?
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
## 2021-08-07 NOTE — Telephone Encounter (Addendum)
Called results to patient and left results on VM (ok per DPR), instructions left to call office back if patient has any questions!  ? ? ?----- Message from Marylu Lund., NP sent at 08/07/2021  7:44 AM EDT ----- ?Good morning, ?Please let Ms. Wills know that her D-dimer was within normal limits and does not indicate any evidence of DVT or PE. ? ?Thank you, ? ?Ambrose Pancoast, NP ?

## 2021-08-07 NOTE — Progress Notes (Signed)
Patient discharged to home w/ family. Given all belongings, instructions. Verbalized understanding of instructions, RN reiterated importance of taking Eliquis to prevent clots and stroke. Escorted to pov via W/c ?
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
## 2021-09-04 ENCOUNTER — Ambulatory Visit (INDEPENDENT_AMBULATORY_CARE_PROVIDER_SITE_OTHER): Payer: Medicare Other | Admitting: Neurology

## 2021-09-04 ENCOUNTER — Encounter: Payer: Self-pay | Admitting: Neurology

## 2021-09-04 VITALS — BP 156/75 | HR 54 | Ht 59.0 in | Wt 109.0 lb

## 2021-09-04 DIAGNOSIS — M542 Cervicalgia: Secondary | ICD-10-CM | POA: Diagnosis not present

## 2021-09-04 DIAGNOSIS — G43009 Migraine without aura, not intractable, without status migrainosus: Secondary | ICD-10-CM

## 2021-09-04 DIAGNOSIS — R42 Dizziness and giddiness: Secondary | ICD-10-CM | POA: Diagnosis not present

## 2021-09-04 MED ORDER — VENLAFAXINE HCL 37.5 MG PO TABS: ORAL_TABLET | ORAL | 0 refills | Status: DC

## 2021-09-04 NOTE — Patient Instructions (Signed)
Start venlafaxine 1 capsule in the morning with breakfast for one week, then increase to 2 capsules every morning at breakfast. ?Follow up 4-5 months. ?

## 2021-09-08 ENCOUNTER — Telehealth (HOSPITAL_BASED_OUTPATIENT_CLINIC_OR_DEPARTMENT_OTHER): Payer: Self-pay

## 2021-09-08 NOTE — Telephone Encounter (Signed)
Called and left message for patient to please return zio monitor or to call office if there are questions.  ?

## 2021-09-10 ENCOUNTER — Encounter (HOSPITAL_BASED_OUTPATIENT_CLINIC_OR_DEPARTMENT_OTHER): Payer: Self-pay

## 2021-09-10 NOTE — Telephone Encounter (Signed)
Letter mailed to patient to ask patient to return monitor.

## 2021-09-11 ENCOUNTER — Ambulatory Visit (HOSPITAL_BASED_OUTPATIENT_CLINIC_OR_DEPARTMENT_OTHER): Payer: Medicare Other | Admitting: Cardiology
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
## 2021-11-05 ENCOUNTER — Encounter: Payer: Self-pay | Admitting: *Deleted
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
## 2021-11-20 ENCOUNTER — Ambulatory Visit: Payer: Medicare Other | Admitting: Physician Assistant

## 2021-11-20 DIAGNOSIS — I4891 Unspecified atrial fibrillation: Secondary | ICD-10-CM

## 2021-11-20 DIAGNOSIS — Z9889 Other specified postprocedural states: Secondary | ICD-10-CM

## 2021-11-20 DIAGNOSIS — K2270 Barrett's esophagus without dysplasia: Secondary | ICD-10-CM

## 2021-11-20 DIAGNOSIS — Z952 Presence of prosthetic heart valve: Secondary | ICD-10-CM
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
## 2022-02-08 ENCOUNTER — Encounter: Payer: Self-pay | Admitting: Neurology

## 2022-02-08 ENCOUNTER — Ambulatory Visit: Payer: Medicare Other | Admitting: Neurology

## 2022-02-08 DIAGNOSIS — Z029 Encounter for administrative examinations, unspecified: Secondary | ICD-10-CM

## 2022-02-16 DIAGNOSIS — I4891 Unspecified atrial fibrillation: Secondary | ICD-10-CM | POA: Diagnosis not present

## 2022-02-16 DIAGNOSIS — R519 Headache, unspecified: Secondary | ICD-10-CM | POA: Diagnosis not present

## 2022-02-16 DIAGNOSIS — Z0289 Encounter for other administrative examinations: Secondary | ICD-10-CM | POA: Diagnosis not present

## 2022-02-16 DIAGNOSIS — K2270 Barrett's esophagus without dysplasia: Secondary | ICD-10-CM | POA: Diagnosis not present

## 2022-06-04 DIAGNOSIS — G43009 Migraine without aura, not intractable, without status migrainosus: Secondary | ICD-10-CM | POA: Diagnosis not present

## 2022-06-04 DIAGNOSIS — Z0289 Encounter for other administrative examinations: Secondary | ICD-10-CM | POA: Diagnosis not present

## 2022-06-04 DIAGNOSIS — I700 Atherosclerosis of aorta: Secondary | ICD-10-CM | POA: Diagnosis not present

## 2022-06-04 DIAGNOSIS — I4891 Unspecified atrial fibrillation: Secondary | ICD-10-CM | POA: Diagnosis not present

## 2022-06-04 DIAGNOSIS — G4700 Insomnia, unspecified: Secondary | ICD-10-CM | POA: Diagnosis not present

## 2022-06-04 DIAGNOSIS — D6869 Other thrombophilia: Secondary | ICD-10-CM | POA: Diagnosis not present

## 2022-06-13 ENCOUNTER — Ambulatory Visit (INDEPENDENT_AMBULATORY_CARE_PROVIDER_SITE_OTHER): Payer: 59

## 2022-06-13 ENCOUNTER — Ambulatory Visit (HOSPITAL_COMMUNITY)
Admission: EM | Admit: 2022-06-13 | Discharge: 2022-06-13 | Disposition: A | Discharge status: Home / Self Care | Payer: 59 | Attending: Emergency Medicine | Admitting: Emergency Medicine

## 2022-06-13 ENCOUNTER — Encounter (HOSPITAL_COMMUNITY): Payer: Self-pay

## 2022-06-13 DIAGNOSIS — R059 Cough, unspecified: Secondary | ICD-10-CM

## 2022-06-13 DIAGNOSIS — J069 Acute upper respiratory infection, unspecified: Secondary | ICD-10-CM | POA: Insufficient documentation

## 2022-06-13 DIAGNOSIS — K449 Diaphragmatic hernia without obstruction or gangrene: Secondary | ICD-10-CM | POA: Diagnosis not present

## 2022-06-13 DIAGNOSIS — I10 Essential (primary) hypertension: Secondary | ICD-10-CM | POA: Insufficient documentation

## 2022-06-13 DIAGNOSIS — Z1152 Encounter for screening for COVID-19: Secondary | ICD-10-CM | POA: Insufficient documentation

## 2022-06-13 MED ORDER — ACETAMINOPHEN 325 MG PO TABS
ORAL_TABLET | ORAL | Status: AC
  Filled 2022-06-13: qty 3

## 2022-06-13 MED ORDER — BENZONATATE 200 MG PO CAPS: 200.0000 mg | ORAL_CAPSULE | Freq: Three times a day (TID) | ORAL | 0 refills | Status: AC

## 2022-06-13 MED ORDER — ACETAMINOPHEN 325 MG PO TABS
ORAL_TABLET | ORAL | Status: AC
  Filled 2022-06-13: qty 1

## 2022-06-13 MED ORDER — ACETAMINOPHEN 325 MG PO TABS
975.0000 mg | ORAL_TABLET | Freq: Once | ORAL | Status: AC
  Administered 2022-06-13: 975 mg via ORAL

## 2022-06-13 NOTE — Discharge Instructions (Addendum)
Your symptoms are consistent with a viral upper respiratory infection.  We have swabbed for COVID-19 and will call if your results are positive.  Please take Tessalon Perles as needed every 8 hours for cough.  You can take Tylenol as needed for your headache.  I suspect that your headache is partially due to a virus and partially due to your hypertension.  Please contact your primary care provider on Monday to schedule follow-up appointment to ensure adequate management.  Please go to the nearest emergency room if you develop changes in vision, worsening of headache, or worsening of symptoms, as you may need imaging and further workup.
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
## 2022-06-13 NOTE — ED Triage Notes (Signed)
Patient here today for productive cough, HA, and loss of voice. X 3 days. Seems to be getting worse. She has been taking Mucinex with no relief. No sick contacts. No recent travels.

## 2022-06-14 LAB — SARS CORONAVIRUS 2 (TAT 6-24 HRS): SARS Coronavirus 2: NEGATIVE

## 2022-07-02 DIAGNOSIS — G43009 Migraine without aura, not intractable, without status migrainosus: Secondary | ICD-10-CM | POA: Diagnosis not present

## 2022-07-02 DIAGNOSIS — M67431 Ganglion, right wrist: Secondary | ICD-10-CM | POA: Diagnosis not present

## 2022-07-02 DIAGNOSIS — Z0289 Encounter for other administrative examinations: Secondary | ICD-10-CM | POA: Diagnosis not present

## 2022-07-02 DIAGNOSIS — G4700 Insomnia, unspecified: Secondary | ICD-10-CM | POA: Diagnosis not present

## 2022-07-02 DIAGNOSIS — K2270 Barrett's esophagus without dysplasia: Secondary | ICD-10-CM | POA: Diagnosis not present

## 2022-07-07 DIAGNOSIS — Z0289 Encounter for other administrative examinations: Secondary | ICD-10-CM | POA: Diagnosis not present

## 2022-07-07 DIAGNOSIS — M67431 Ganglion, right wrist: Secondary | ICD-10-CM | POA: Diagnosis not present

## 2022-07-07 DIAGNOSIS — I10 Essential (primary) hypertension: Secondary | ICD-10-CM | POA: Diagnosis not present

## 2022-07-07 DIAGNOSIS — E785 Hyperlipidemia, unspecified: Secondary | ICD-10-CM | POA: Diagnosis not present

## 2022-07-07 DIAGNOSIS — M79641 Pain in right hand: Secondary | ICD-10-CM | POA: Diagnosis not present

## 2022-07-08 ENCOUNTER — Ambulatory Visit
Admission: RE | Admit: 2022-07-08 | Discharge: 2022-07-08 | Disposition: A | Discharge status: Home / Self Care | Payer: 59 | Source: Ambulatory Visit | Attending: Family | Admitting: Family

## 2022-07-08 ENCOUNTER — Other Ambulatory Visit: Payer: Self-pay | Admitting: Family

## 2022-07-08 DIAGNOSIS — M7989 Other specified soft tissue disorders: Secondary | ICD-10-CM | POA: Diagnosis not present

## 2022-07-08 DIAGNOSIS — M79641 Pain in right hand: Secondary | ICD-10-CM

## 2022-07-08 DIAGNOSIS — M19031 Primary osteoarthritis, right wrist: Secondary | ICD-10-CM | POA: Diagnosis not present

## 2022-08-04 DIAGNOSIS — M67431 Ganglion, right wrist: Secondary | ICD-10-CM | POA: Diagnosis not present

## 2022-08-04 DIAGNOSIS — E785 Hyperlipidemia, unspecified: Secondary | ICD-10-CM | POA: Diagnosis not present

## 2022-08-04 DIAGNOSIS — L299 Pruritus, unspecified: Secondary | ICD-10-CM | POA: Diagnosis not present

## 2022-08-04 DIAGNOSIS — I10 Essential (primary) hypertension: Secondary | ICD-10-CM | POA: Diagnosis not present

## 2022-08-04 DIAGNOSIS — Z0289 Encounter for other administrative examinations: Secondary | ICD-10-CM | POA: Diagnosis not present

## 2022-08-04 DIAGNOSIS — M545 Low back pain, unspecified: Secondary | ICD-10-CM | POA: Diagnosis not present

## 2022-09-16 DIAGNOSIS — N1831 Chronic kidney disease, stage 3a: Secondary | ICD-10-CM | POA: Diagnosis not present

## 2022-09-16 DIAGNOSIS — G4700 Insomnia, unspecified: Secondary | ICD-10-CM | POA: Diagnosis not present

## 2022-09-16 DIAGNOSIS — Z8679 Personal history of other diseases of the circulatory system: Secondary | ICD-10-CM | POA: Diagnosis not present

## 2022-09-16 DIAGNOSIS — Z0289 Encounter for other administrative examinations: Secondary | ICD-10-CM | POA: Diagnosis not present

## 2022-09-16 DIAGNOSIS — Z86718 Personal history of other venous thrombosis and embolism: Secondary | ICD-10-CM | POA: Diagnosis not present

## 2022-09-16 DIAGNOSIS — D8489 Other immunodeficiencies: Secondary | ICD-10-CM | POA: Diagnosis not present

## 2022-09-16 DIAGNOSIS — M810 Age-related osteoporosis without current pathological fracture: Secondary | ICD-10-CM | POA: Diagnosis not present

## 2022-09-16 DIAGNOSIS — R911 Solitary pulmonary nodule: Secondary | ICD-10-CM | POA: Diagnosis not present

## 2022-11-30 DIAGNOSIS — H35033 Hypertensive retinopathy, bilateral: Secondary | ICD-10-CM | POA: Diagnosis not present

## 2022-11-30 DIAGNOSIS — H348122 Central retinal vein occlusion, left eye, stable: Secondary | ICD-10-CM | POA: Diagnosis not present

## 2022-11-30 DIAGNOSIS — H5709 Other anomalies of pupillary function: Secondary | ICD-10-CM | POA: Diagnosis not present

## 2022-11-30 DIAGNOSIS — H524 Presbyopia: Secondary | ICD-10-CM | POA: Diagnosis not present

## 2022-11-30 DIAGNOSIS — H4089 Other specified glaucoma: Secondary | ICD-10-CM | POA: Diagnosis not present

## 2022-12-13 DIAGNOSIS — D709 Neutropenia, unspecified: Secondary | ICD-10-CM | POA: Diagnosis not present

## 2022-12-13 DIAGNOSIS — E559 Vitamin D deficiency, unspecified: Secondary | ICD-10-CM | POA: Diagnosis not present

## 2022-12-13 DIAGNOSIS — I130 Hypertensive heart and chronic kidney disease with heart failure and stage 1 through stage 4 chronic kidney disease, or unspecified chronic kidney disease: Secondary | ICD-10-CM | POA: Diagnosis not present

## 2022-12-13 DIAGNOSIS — D6949 Other primary thrombocytopenia: Secondary | ICD-10-CM | POA: Diagnosis not present

## 2022-12-13 DIAGNOSIS — D6869 Other thrombophilia: Secondary | ICD-10-CM | POA: Diagnosis not present

## 2022-12-13 DIAGNOSIS — Z79899 Other long term (current) drug therapy: Secondary | ICD-10-CM | POA: Diagnosis not present

## 2022-12-13 DIAGNOSIS — E785 Hyperlipidemia, unspecified: Secondary | ICD-10-CM | POA: Diagnosis not present

## 2022-12-13 DIAGNOSIS — Z0001 Encounter for general adult medical examination with abnormal findings: Secondary | ICD-10-CM | POA: Diagnosis not present

## 2023-01-03 DIAGNOSIS — H4089 Other specified glaucoma: Secondary | ICD-10-CM | POA: Diagnosis not present

## 2023-01-03 DIAGNOSIS — H35033 Hypertensive retinopathy, bilateral: Secondary | ICD-10-CM | POA: Diagnosis not present

## 2023-01-03 DIAGNOSIS — H348122 Central retinal vein occlusion, left eye, stable: Secondary | ICD-10-CM | POA: Diagnosis not present

## 2023-01-03 DIAGNOSIS — H5709 Other anomalies of pupillary function: Secondary | ICD-10-CM | POA: Diagnosis not present

## 2023-02-02 ENCOUNTER — Inpatient Hospital Stay (HOSPITAL_COMMUNITY)
Admission: EM | Admit: 2023-02-02 | Discharge: 2023-02-11 | DRG: 305 | Disposition: A | Discharge status: Home Health | Payer: 59 | Attending: Family Medicine | Admitting: Family Medicine

## 2023-02-02 ENCOUNTER — Encounter (HOSPITAL_COMMUNITY): Payer: Self-pay

## 2023-02-02 ENCOUNTER — Emergency Department (HOSPITAL_BASED_OUTPATIENT_CLINIC_OR_DEPARTMENT_OTHER): Payer: 59

## 2023-02-02 ENCOUNTER — Other Ambulatory Visit: Payer: Self-pay

## 2023-02-02 DIAGNOSIS — G629 Polyneuropathy, unspecified: Secondary | ICD-10-CM | POA: Diagnosis not present

## 2023-02-02 DIAGNOSIS — Z6834 Body mass index (BMI) 34.0-34.9, adult: Secondary | ICD-10-CM

## 2023-02-02 DIAGNOSIS — H409 Unspecified glaucoma: Secondary | ICD-10-CM | POA: Diagnosis present

## 2023-02-02 DIAGNOSIS — E669 Obesity, unspecified: Secondary | ICD-10-CM | POA: Diagnosis present

## 2023-02-02 DIAGNOSIS — D649 Anemia, unspecified: Secondary | ICD-10-CM | POA: Diagnosis not present

## 2023-02-02 DIAGNOSIS — E876 Hypokalemia: Secondary | ICD-10-CM | POA: Diagnosis not present

## 2023-02-02 DIAGNOSIS — K2100 Gastro-esophageal reflux disease with esophagitis, without bleeding: Secondary | ICD-10-CM | POA: Diagnosis present

## 2023-02-02 DIAGNOSIS — K5900 Constipation, unspecified: Secondary | ICD-10-CM | POA: Diagnosis not present

## 2023-02-02 DIAGNOSIS — D509 Iron deficiency anemia, unspecified: Secondary | ICD-10-CM | POA: Diagnosis present

## 2023-02-02 DIAGNOSIS — R609 Edema, unspecified: Secondary | ICD-10-CM | POA: Diagnosis not present

## 2023-02-02 DIAGNOSIS — G8929 Other chronic pain: Secondary | ICD-10-CM | POA: Diagnosis not present

## 2023-02-02 DIAGNOSIS — Q399 Congenital malformation of esophagus, unspecified: Secondary | ICD-10-CM | POA: Diagnosis not present

## 2023-02-02 DIAGNOSIS — Z952 Presence of prosthetic heart valve: Secondary | ICD-10-CM

## 2023-02-02 DIAGNOSIS — F419 Anxiety disorder, unspecified: Secondary | ICD-10-CM | POA: Diagnosis present

## 2023-02-02 DIAGNOSIS — Z953 Presence of xenogenic heart valve: Secondary | ICD-10-CM | POA: Diagnosis not present

## 2023-02-02 DIAGNOSIS — D61818 Other pancytopenia: Secondary | ICD-10-CM | POA: Diagnosis not present

## 2023-02-02 DIAGNOSIS — Z7901 Long term (current) use of anticoagulants: Secondary | ICD-10-CM | POA: Diagnosis not present

## 2023-02-02 DIAGNOSIS — I161 Hypertensive emergency: Secondary | ICD-10-CM | POA: Diagnosis not present

## 2023-02-02 DIAGNOSIS — Z79899 Other long term (current) drug therapy: Secondary | ICD-10-CM

## 2023-02-02 DIAGNOSIS — K220 Achalasia of cardia: Secondary | ICD-10-CM | POA: Diagnosis present

## 2023-02-02 DIAGNOSIS — J410 Simple chronic bronchitis: Secondary | ICD-10-CM | POA: Diagnosis not present

## 2023-02-02 DIAGNOSIS — E785 Hyperlipidemia, unspecified: Secondary | ICD-10-CM | POA: Diagnosis present

## 2023-02-02 DIAGNOSIS — Z8249 Family history of ischemic heart disease and other diseases of the circulatory system: Secondary | ICD-10-CM | POA: Diagnosis not present

## 2023-02-02 DIAGNOSIS — R1012 Left upper quadrant pain: Secondary | ICD-10-CM | POA: Diagnosis not present

## 2023-02-02 DIAGNOSIS — R1032 Left lower quadrant pain: Secondary | ICD-10-CM | POA: Diagnosis not present

## 2023-02-02 DIAGNOSIS — Z6822 Body mass index (BMI) 22.0-22.9, adult: Secondary | ICD-10-CM | POA: Diagnosis not present

## 2023-02-02 DIAGNOSIS — K2970 Gastritis, unspecified, without bleeding: Secondary | ICD-10-CM | POA: Diagnosis not present

## 2023-02-02 DIAGNOSIS — R001 Bradycardia, unspecified: Secondary | ICD-10-CM | POA: Diagnosis not present

## 2023-02-02 DIAGNOSIS — Z886 Allergy status to analgesic agent status: Secondary | ICD-10-CM

## 2023-02-02 DIAGNOSIS — Z833 Family history of diabetes mellitus: Secondary | ICD-10-CM | POA: Diagnosis not present

## 2023-02-02 DIAGNOSIS — K3189 Other diseases of stomach and duodenum: Secondary | ICD-10-CM | POA: Diagnosis not present

## 2023-02-02 DIAGNOSIS — I10 Essential (primary) hypertension: Secondary | ICD-10-CM | POA: Diagnosis not present

## 2023-02-02 DIAGNOSIS — I2510 Atherosclerotic heart disease of native coronary artery without angina pectoris: Secondary | ICD-10-CM | POA: Diagnosis present

## 2023-02-02 DIAGNOSIS — K2270 Barrett's esophagus without dysplasia: Secondary | ICD-10-CM | POA: Diagnosis present

## 2023-02-02 DIAGNOSIS — K8020 Calculus of gallbladder without cholecystitis without obstruction: Secondary | ICD-10-CM | POA: Diagnosis present

## 2023-02-02 DIAGNOSIS — R079 Chest pain, unspecified: Secondary | ICD-10-CM | POA: Diagnosis not present

## 2023-02-02 DIAGNOSIS — I700 Atherosclerosis of aorta: Secondary | ICD-10-CM | POA: Diagnosis not present

## 2023-02-02 DIAGNOSIS — Z87891 Personal history of nicotine dependence: Secondary | ICD-10-CM | POA: Diagnosis not present

## 2023-02-02 DIAGNOSIS — I509 Heart failure, unspecified: Secondary | ICD-10-CM | POA: Diagnosis not present

## 2023-02-02 DIAGNOSIS — M7989 Other specified soft tissue disorders: Secondary | ICD-10-CM | POA: Diagnosis not present

## 2023-02-02 DIAGNOSIS — Z86718 Personal history of other venous thrombosis and embolism: Secondary | ICD-10-CM

## 2023-02-02 DIAGNOSIS — D631 Anemia in chronic kidney disease: Secondary | ICD-10-CM | POA: Diagnosis not present

## 2023-02-02 DIAGNOSIS — R1011 Right upper quadrant pain: Secondary | ICD-10-CM | POA: Diagnosis not present

## 2023-02-02 DIAGNOSIS — K219 Gastro-esophageal reflux disease without esophagitis: Secondary | ICD-10-CM | POA: Diagnosis not present

## 2023-02-02 DIAGNOSIS — I160 Hypertensive urgency: Principal | ICD-10-CM | POA: Diagnosis present

## 2023-02-02 DIAGNOSIS — F413 Other mixed anxiety disorders: Secondary | ICD-10-CM | POA: Diagnosis not present

## 2023-02-02 DIAGNOSIS — I480 Paroxysmal atrial fibrillation: Secondary | ICD-10-CM | POA: Diagnosis not present

## 2023-02-02 DIAGNOSIS — K449 Diaphragmatic hernia without obstruction or gangrene: Secondary | ICD-10-CM | POA: Diagnosis not present

## 2023-02-02 DIAGNOSIS — F32A Depression, unspecified: Secondary | ICD-10-CM | POA: Diagnosis present

## 2023-02-02 DIAGNOSIS — K3184 Gastroparesis: Secondary | ICD-10-CM | POA: Diagnosis present

## 2023-02-02 DIAGNOSIS — I5021 Acute systolic (congestive) heart failure: Secondary | ICD-10-CM | POA: Diagnosis not present

## 2023-02-02 DIAGNOSIS — R0789 Other chest pain: Secondary | ICD-10-CM | POA: Diagnosis not present

## 2023-02-02 DIAGNOSIS — R600 Localized edema: Principal | ICD-10-CM

## 2023-02-02 DIAGNOSIS — N1831 Chronic kidney disease, stage 3a: Secondary | ICD-10-CM | POA: Diagnosis not present

## 2023-02-02 DIAGNOSIS — R1013 Epigastric pain: Secondary | ICD-10-CM | POA: Diagnosis not present

## 2023-02-02 DIAGNOSIS — J449 Chronic obstructive pulmonary disease, unspecified: Secondary | ICD-10-CM | POA: Diagnosis not present

## 2023-02-02 LAB — CBC WITH DIFFERENTIAL/PLATELET
Abs Immature Granulocytes: 0.01 10*3/uL (ref 0.00–0.07)
Basophils Absolute: 0 10*3/uL (ref 0.0–0.1)
Basophils Relative: 1 %
Eosinophils Absolute: 0 10*3/uL (ref 0.0–0.5)
Eosinophils Relative: 1 %
HCT: 33.9 % — ABNORMAL LOW (ref 36.0–46.0)
Hemoglobin: 10.5 g/dL — ABNORMAL LOW (ref 12.0–15.0)
Immature Granulocytes: 0 %
Lymphocytes Relative: 34 %
Lymphs Abs: 1.2 10*3/uL (ref 0.7–4.0)
MCH: 27.4 pg (ref 26.0–34.0)
MCHC: 31 g/dL (ref 30.0–36.0)
MCV: 88.5 fL (ref 80.0–100.0)
Monocytes Absolute: 0.5 10*3/uL (ref 0.1–1.0)
Monocytes Relative: 14 %
Neutro Abs: 1.7 10*3/uL (ref 1.7–7.7)
Neutrophils Relative %: 50 %
Platelets: 130 10*3/uL — ABNORMAL LOW (ref 150–400)
RBC: 3.83 MIL/uL — ABNORMAL LOW (ref 3.87–5.11)
RDW: 13.5 % (ref 11.5–15.5)
WBC: 3.4 10*3/uL — ABNORMAL LOW (ref 4.0–10.5)
nRBC: 0 % (ref 0.0–0.2)

## 2023-02-02 LAB — COMPREHENSIVE METABOLIC PANEL
ALT: 13 U/L (ref 0–44)
AST: 22 U/L (ref 15–41)
Albumin: 4.4 g/dL (ref 3.5–5.0)
Alkaline Phosphatase: 67 U/L (ref 38–126)
Anion gap: 8 (ref 5–15)
BUN: 18 mg/dL (ref 8–23)
CO2: 25 mmol/L (ref 22–32)
Calcium: 8.9 mg/dL (ref 8.9–10.3)
Chloride: 104 mmol/L (ref 98–111)
Creatinine, Ser: 0.83 mg/dL (ref 0.44–1.00)
GFR, Estimated: >60 mL/min (ref >60)
Glucose, Bld: 90 mg/dL (ref 70–99)
Potassium: 3.7 mmol/L (ref 3.5–5.1)
Sodium: 137 mmol/L (ref 135–145)
Total Bilirubin: 0.8 mg/dL (ref 0.3–1.2)
Total Protein: 7.8 g/dL (ref 6.5–8.1)

## 2023-02-02 NOTE — ED Triage Notes (Addendum)
Pt saw PCP today for bilateral leg swelling and left lower leg redness for 2 weeks. PCP concerned for blood clot. Pt has hx of afib and was on blood thinner, stopped it for years, and now will be placed back on it by PCP.

## 2023-02-02 NOTE — ED Provider Triage Note (Signed)
Emergency Medicine Provider Triage Evaluation Note  Sherri Mcmillan , a 89 y.o. female  was evaluated in triage.  Pt complains of left leg pain and swelling for 2 weeks.  She denies any shortness of breath, palpitations, chest pain, shortness of breath.  Review of Systems  Positive: As above Negative: As above  Physical Exam  BP (!) 197/111 (BP Location: Left Arm)   Pulse 66   Temp 98.7 F (37.1 C) (Oral)   Ht 4' (1.219 m)   Wt 52 kg   LMP 04/26/1950   SpO2 100%   BMI 34.97 kg/m  Gen:   Awake, no distress   Resp:  Normal effort  MSK:   Moves extremities without difficulty  Other:  Left calf tender to palpation, left calf slightly more edematous, no pitting edema  Medical Decision Making  Medically screening exam initiated at 6:39 PM.  Appropriate orders placed.  Sherri Mcmillan was informed that the remainder of the evaluation will be completed by another provider, this initial triage assessment does not replace that evaluation, and the importance of remaining in the ED until their evaluation is complete.     [name], PA-C 02/02/23 1840

## 2023-02-02 NOTE — Progress Notes (Signed)
Lower extremity venous duplex completed. Please see CV Procedures for preliminary results.  Shona Simpson, RVT 02/02/23 7:07 PM

## 2023-02-03 ENCOUNTER — Emergency Department (HOSPITAL_COMMUNITY): Payer: 59

## 2023-02-03 ENCOUNTER — Inpatient Hospital Stay (HOSPITAL_COMMUNITY): Payer: 59

## 2023-02-03 DIAGNOSIS — G8929 Other chronic pain: Secondary | ICD-10-CM | POA: Diagnosis not present

## 2023-02-03 DIAGNOSIS — R079 Chest pain, unspecified: Secondary | ICD-10-CM

## 2023-02-03 DIAGNOSIS — K3184 Gastroparesis: Secondary | ICD-10-CM | POA: Diagnosis present

## 2023-02-03 DIAGNOSIS — F32A Depression, unspecified: Secondary | ICD-10-CM | POA: Diagnosis present

## 2023-02-03 DIAGNOSIS — K5900 Constipation, unspecified: Secondary | ICD-10-CM | POA: Diagnosis not present

## 2023-02-03 DIAGNOSIS — F413 Other mixed anxiety disorders: Secondary | ICD-10-CM | POA: Diagnosis not present

## 2023-02-03 DIAGNOSIS — R600 Localized edema: Secondary | ICD-10-CM | POA: Diagnosis not present

## 2023-02-03 DIAGNOSIS — Z8249 Family history of ischemic heart disease and other diseases of the circulatory system: Secondary | ICD-10-CM | POA: Diagnosis not present

## 2023-02-03 DIAGNOSIS — I5021 Acute systolic (congestive) heart failure: Secondary | ICD-10-CM | POA: Diagnosis not present

## 2023-02-03 DIAGNOSIS — Z6834 Body mass index (BMI) 34.0-34.9, adult: Secondary | ICD-10-CM | POA: Diagnosis not present

## 2023-02-03 DIAGNOSIS — I480 Paroxysmal atrial fibrillation: Secondary | ICD-10-CM | POA: Diagnosis not present

## 2023-02-03 DIAGNOSIS — Z87891 Personal history of nicotine dependence: Secondary | ICD-10-CM | POA: Diagnosis not present

## 2023-02-03 DIAGNOSIS — Z86718 Personal history of other venous thrombosis and embolism: Secondary | ICD-10-CM | POA: Diagnosis not present

## 2023-02-03 DIAGNOSIS — I700 Atherosclerosis of aorta: Secondary | ICD-10-CM | POA: Diagnosis not present

## 2023-02-03 DIAGNOSIS — R0789 Other chest pain: Secondary | ICD-10-CM | POA: Diagnosis not present

## 2023-02-03 DIAGNOSIS — K8020 Calculus of gallbladder without cholecystitis without obstruction: Secondary | ICD-10-CM | POA: Diagnosis not present

## 2023-02-03 DIAGNOSIS — R1012 Left upper quadrant pain: Secondary | ICD-10-CM | POA: Diagnosis not present

## 2023-02-03 DIAGNOSIS — R109 Unspecified abdominal pain: Secondary | ICD-10-CM | POA: Diagnosis not present

## 2023-02-03 DIAGNOSIS — Z833 Family history of diabetes mellitus: Secondary | ICD-10-CM | POA: Diagnosis not present

## 2023-02-03 DIAGNOSIS — I161 Hypertensive emergency: Secondary | ICD-10-CM | POA: Diagnosis not present

## 2023-02-03 DIAGNOSIS — Q399 Congenital malformation of esophagus, unspecified: Secondary | ICD-10-CM | POA: Diagnosis not present

## 2023-02-03 DIAGNOSIS — K5730 Diverticulosis of large intestine without perforation or abscess without bleeding: Secondary | ICD-10-CM | POA: Diagnosis not present

## 2023-02-03 DIAGNOSIS — K449 Diaphragmatic hernia without obstruction or gangrene: Secondary | ICD-10-CM | POA: Diagnosis not present

## 2023-02-03 DIAGNOSIS — I509 Heart failure, unspecified: Secondary | ICD-10-CM

## 2023-02-03 DIAGNOSIS — I10 Essential (primary) hypertension: Secondary | ICD-10-CM | POA: Diagnosis not present

## 2023-02-03 DIAGNOSIS — R1032 Left lower quadrant pain: Secondary | ICD-10-CM | POA: Diagnosis not present

## 2023-02-03 DIAGNOSIS — R609 Edema, unspecified: Secondary | ICD-10-CM | POA: Diagnosis present

## 2023-02-03 DIAGNOSIS — E785 Hyperlipidemia, unspecified: Secondary | ICD-10-CM | POA: Diagnosis present

## 2023-02-03 DIAGNOSIS — K2970 Gastritis, unspecified, without bleeding: Secondary | ICD-10-CM | POA: Diagnosis not present

## 2023-02-03 DIAGNOSIS — I160 Hypertensive urgency: Secondary | ICD-10-CM

## 2023-02-03 DIAGNOSIS — E669 Obesity, unspecified: Secondary | ICD-10-CM | POA: Diagnosis present

## 2023-02-03 DIAGNOSIS — K220 Achalasia of cardia: Secondary | ICD-10-CM | POA: Diagnosis present

## 2023-02-03 DIAGNOSIS — K219 Gastro-esophageal reflux disease without esophagitis: Secondary | ICD-10-CM | POA: Diagnosis not present

## 2023-02-03 DIAGNOSIS — D61818 Other pancytopenia: Secondary | ICD-10-CM | POA: Diagnosis not present

## 2023-02-03 DIAGNOSIS — I2510 Atherosclerotic heart disease of native coronary artery without angina pectoris: Secondary | ICD-10-CM | POA: Diagnosis present

## 2023-02-03 DIAGNOSIS — D509 Iron deficiency anemia, unspecified: Secondary | ICD-10-CM | POA: Diagnosis not present

## 2023-02-03 DIAGNOSIS — F419 Anxiety disorder, unspecified: Secondary | ICD-10-CM | POA: Diagnosis present

## 2023-02-03 DIAGNOSIS — K2100 Gastro-esophageal reflux disease with esophagitis, without bleeding: Secondary | ICD-10-CM | POA: Diagnosis present

## 2023-02-03 DIAGNOSIS — G629 Polyneuropathy, unspecified: Secondary | ICD-10-CM | POA: Diagnosis present

## 2023-02-03 DIAGNOSIS — K7689 Other specified diseases of liver: Secondary | ICD-10-CM | POA: Diagnosis not present

## 2023-02-03 DIAGNOSIS — R1011 Right upper quadrant pain: Secondary | ICD-10-CM | POA: Diagnosis not present

## 2023-02-03 DIAGNOSIS — E876 Hypokalemia: Secondary | ICD-10-CM | POA: Diagnosis not present

## 2023-02-03 DIAGNOSIS — K3189 Other diseases of stomach and duodenum: Secondary | ICD-10-CM | POA: Diagnosis not present

## 2023-02-03 DIAGNOSIS — Z953 Presence of xenogenic heart valve: Secondary | ICD-10-CM | POA: Diagnosis not present

## 2023-02-03 DIAGNOSIS — K2270 Barrett's esophagus without dysplasia: Secondary | ICD-10-CM | POA: Diagnosis not present

## 2023-02-03 DIAGNOSIS — Z7901 Long term (current) use of anticoagulants: Secondary | ICD-10-CM | POA: Diagnosis not present

## 2023-02-03 DIAGNOSIS — R1013 Epigastric pain: Secondary | ICD-10-CM | POA: Diagnosis not present

## 2023-02-03 LAB — TROPONIN I (HIGH SENSITIVITY)
Troponin I (High Sensitivity): 16 ng/L (ref <18)
Troponin I (High Sensitivity): 17 ng/L (ref <18)

## 2023-02-03 LAB — CK: Total CK: 106 U/L (ref 38–234)

## 2023-02-03 LAB — BRAIN NATRIURETIC PEPTIDE: B Natriuretic Peptide: 259.1 pg/mL — ABNORMAL HIGH (ref 0.0–100.0)

## 2023-02-03 LAB — CBG MONITORING, ED: Glucose-Capillary: 85 mg/dL (ref 70–99)

## 2023-02-03 LAB — TSH: TSH: 1.322 u[IU]/mL (ref 0.350–4.500)

## 2023-02-03 MED ORDER — ACETAMINOPHEN 325 MG PO TABS
650.0000 mg | ORAL_TABLET | Freq: Four times a day (QID) | ORAL | Status: DC | PRN
  Administered 2023-02-03 – 2023-02-07 (×3): 650 mg via ORAL
  Filled 2023-02-03 (×3): qty 2

## 2023-02-03 MED ORDER — ONDANSETRON HCL 4 MG/2ML IJ SOLN
4.0000 mg | Freq: Four times a day (QID) | INTRAMUSCULAR | Status: DC | PRN
  Administered 2023-02-05: 4 mg via INTRAVENOUS
  Filled 2023-02-03: qty 2

## 2023-02-03 MED ORDER — AMLODIPINE BESYLATE 5 MG PO TABS: 5.0000 mg | ORAL_TABLET | Freq: Every day | ORAL | Status: DC

## 2023-02-03 MED ORDER — HYDROXYZINE HCL 25 MG PO TABS
25.0000 mg | ORAL_TABLET | Freq: Three times a day (TID) | ORAL | Status: DC | PRN
  Administered 2023-02-03 – 2023-02-04 (×2): 25 mg via ORAL
  Filled 2023-02-03 (×2): qty 1

## 2023-02-03 MED ORDER — AMLODIPINE BESYLATE 5 MG PO TABS
5.0000 mg | ORAL_TABLET | Freq: Once | ORAL | Status: AC
  Administered 2023-02-03: 5 mg via ORAL
  Filled 2023-02-03: qty 1

## 2023-02-03 MED ORDER — ALBUTEROL SULFATE (2.5 MG/3ML) 0.083% IN NEBU: 2.5000 mg | INHALATION_SOLUTION | RESPIRATORY_TRACT | Status: DC | PRN

## 2023-02-03 MED ORDER — QUETIAPINE FUMARATE 25 MG PO TABS
25.0000 mg | ORAL_TABLET | Freq: Every day | ORAL | Status: DC
  Administered 2023-02-03 – 2023-02-10 (×8): 25 mg via ORAL
  Filled 2023-02-03 (×8): qty 1

## 2023-02-03 MED ORDER — FUROSEMIDE 10 MG/ML IJ SOLN
20.0000 mg | Freq: Once | INTRAMUSCULAR | Status: AC
  Administered 2023-02-03: 20 mg via INTRAVENOUS
  Filled 2023-02-03: qty 4

## 2023-02-03 MED ORDER — HYDRALAZINE HCL 20 MG/ML IJ SOLN: 5.0000 mg | Freq: Four times a day (QID) | INTRAMUSCULAR | Status: DC | PRN

## 2023-02-03 MED ORDER — BRIMONIDINE TARTRATE 0.2 % OP SOLN
1.0000 [drp] | Freq: Two times a day (BID) | OPHTHALMIC | Status: DC
  Administered 2023-02-03 – 2023-02-11 (×16): 1 [drp] via OPHTHALMIC
  Filled 2023-02-03: qty 5

## 2023-02-03 MED ORDER — APIXABAN 5 MG PO TABS: 5.0000 mg | ORAL_TABLET | Freq: Two times a day (BID) | ORAL | Status: DC

## 2023-02-03 MED ORDER — LATANOPROST 0.005 % OP SOLN
1.0000 [drp] | Freq: Every day | OPHTHALMIC | Status: DC
  Administered 2023-02-03 – 2023-02-10 (×8): 1 [drp] via OPHTHALMIC
  Filled 2023-02-03: qty 2.5

## 2023-02-03 MED ORDER — HYDRALAZINE HCL 20 MG/ML IJ SOLN
10.0000 mg | Freq: Once | INTRAMUSCULAR | Status: AC
  Administered 2023-02-03: 10 mg via INTRAVENOUS
  Filled 2023-02-03: qty 1

## 2023-02-03 MED ORDER — TRAZODONE HCL 50 MG PO TABS: 25.0000 mg | ORAL_TABLET | Freq: Every evening | ORAL | Status: DC | PRN

## 2023-02-03 MED ORDER — DORZOLAMIDE HCL-TIMOLOL MAL 2-0.5 % OP SOLN
1.0000 [drp] | Freq: Two times a day (BID) | OPHTHALMIC | Status: DC
  Administered 2023-02-03 – 2023-02-11 (×16): 1 [drp] via OPHTHALMIC
  Filled 2023-02-03: qty 10

## 2023-02-03 MED ORDER — APIXABAN 2.5 MG PO TABS
2.5000 mg | ORAL_TABLET | Freq: Two times a day (BID) | ORAL | Status: DC
  Administered 2023-02-03 – 2023-02-06 (×8): 2.5 mg via ORAL
  Filled 2023-02-03 (×9): qty 1

## 2023-02-03 MED ORDER — GABAPENTIN 100 MG PO CAPS
100.0000 mg | ORAL_CAPSULE | Freq: Three times a day (TID) | ORAL | Status: DC
  Administered 2023-02-03 – 2023-02-11 (×25): 100 mg via ORAL
  Filled 2023-02-03 (×25): qty 1

## 2023-02-03 MED ORDER — HYDROCODONE-ACETAMINOPHEN 5-325 MG PO TABS
1.0000 | ORAL_TABLET | Freq: Once | ORAL | Status: AC
  Administered 2023-02-03: 1 via ORAL
  Filled 2023-02-03: qty 1

## 2023-02-03 MED ORDER — ONDANSETRON HCL 4 MG PO TABS: 4.0000 mg | ORAL_TABLET | Freq: Four times a day (QID) | ORAL | Status: DC | PRN

## 2023-02-03 MED ORDER — ONDANSETRON 4 MG PO TBDP
4.0000 mg | ORAL_TABLET | Freq: Once | ORAL | Status: AC
  Administered 2023-02-03: 4 mg via ORAL
  Filled 2023-02-03: qty 1

## 2023-02-03 MED ORDER — HYDRALAZINE HCL 20 MG/ML IJ SOLN
5.0000 mg | Freq: Once | INTRAMUSCULAR | Status: AC
  Administered 2023-02-03: 5 mg via INTRAVENOUS
  Filled 2023-02-03: qty 1

## 2023-02-03 MED ORDER — ACETAMINOPHEN 650 MG RE SUPP: 650.0000 mg | Freq: Four times a day (QID) | RECTAL | Status: DC | PRN

## 2023-02-03 NOTE — Progress Notes (Signed)
Attempted Echocardiogram, patient is moving to another floor.

## 2023-02-03 NOTE — ED Provider Notes (Signed)
Sherri Mcmillan EMERGENCY DEPARTMENT AT [facility] Provider Note   CSN: [id number] Arrival date & time: 02/02/23  1729     History  Chief Complaint  Patient presents with   Leg Swelling    Sherri Mcmillan is a 89 y.o. female.  The history is provided by the patient.  Sherri Mcmillan is a 89 y.o. female who presents to the Emergency Department complaining of lower extremity edema.  She presents to the ED for evaluation of BLE edema for the last two weeks, left greater than right.  No new injuries.  No fever, sob, cough.  She lives alone and sees [employer].  Also reports intermittent chest burning for several months, occasional DOE.  She was referred to the ED for r/o DVT and lower extremity edema.   Home Medications Prior to Admission medications   Medication Sig Start Date End Date Taking? Authorizing Provider  amLODipine (NORVASC) 5 MG tablet Take 1 tablet (5 mg total) by mouth daily. 08/08/21   Almon Hercules, MD  DEXILANT 60 MG capsule TAKE 1 CAPSULE BY MOUTH EVERY DAY Patient taking differently: Take 60 mg by mouth daily. 09/11/18   Sherrilyn Rist, MD  dorzolamide-timolol (COSOPT) 22.3-6.8 MG/ML ophthalmic solution Place 1 drop into the left eye 2 (two) times daily. 07/17/21   [provider]  latanoprost (XALATAN) 0.005 % ophthalmic solution Place 1 drop into both eyes at bedtime. 07/17/21   [provider]      Allergies    Aspirin    Review of Systems   Review of Systems  All other systems reviewed and are negative.   Physical Exam Updated Vital Signs BP (!) 115/46 (BP Location: Right Arm)   Pulse 97   Temp 97.9 F (36.6 C)   Resp 19   Ht 4' (1.219 m)   Wt 52 kg   LMP 04/26/1950   SpO2 100%   BMI 34.97 kg/m  Physical Exam Vitals and nursing note reviewed.  Constitutional:      Appearance: She is well-developed.  HENT:     Head: Normocephalic and atraumatic.  Cardiovascular:     Rate and Rhythm: Normal rate and regular  rhythm.     Heart sounds: No murmur heard. Pulmonary:     Effort: Pulmonary effort is normal. No respiratory distress.     Breath sounds: Normal breath sounds.  Abdominal:     Palpations: Abdomen is soft.     Tenderness: There is no abdominal tenderness. There is no guarding or rebound.  Musculoskeletal:     Comments: 2+ DP pulses bilaterally.  1+ Pitting edema to BLE, left greater than right.   Skin:    General: Skin is warm and dry.  Neurological:     Mental Status: She is alert and oriented to person, place, and time.  Psychiatric:        Behavior: Behavior normal.     ED Results / Procedures / Treatments   Labs (all labs ordered are listed, but only abnormal results are displayed) Labs Reviewed  CBC WITH DIFFERENTIAL/PLATELET - Abnormal; Notable for the following components:      Result Value   WBC 3.4 (*)    RBC 3.83 (*)    Hemoglobin 10.5 (*)    HCT 33.9 (*)    Platelets 130 (*)    All other components within normal limits  BRAIN NATRIURETIC PEPTIDE - Abnormal; Notable for the following components:   B Natriuretic Peptide 259.1 (*)  All other components within normal limits  COMPREHENSIVE METABOLIC PANEL  CBG MONITORING, ED  TROPONIN I (HIGH SENSITIVITY)  TROPONIN I (HIGH SENSITIVITY)    EKG EKG Interpretation Date/Time:  Wednesday February 02 2023 19:28:03 EDT Ventricular Rate:  56 PR Interval:  140 QRS Duration:  85 QT Interval:  391 QTC Calculation: 378 R Axis:   47  Text Interpretation: Sinus bradycardia No STEMI Confirmed by Ernie Avena ([id number]) on 02/02/2023 8:58:12 PM  Radiology DG Chest 2 View  Result Date: 02/03/2023 CLINICAL DATA:  chest pain EXAM: CHEST - 2 VIEW COMPARISON:  Chest x-ray 06/13/2022, CT angio chest 05/01/2018 FINDINGS: The heart and mediastinal contours are unchanged. Aortic calcification. Aortic valve replacement. Flattening of the hemidiaphragms with nonspecific blunting of the costophrenic angles. No focal consolidation. No  pulmonary edema. No pleural effusion. No pneumothorax. No acute osseous abnormality. IMPRESSION: 1. No active cardiopulmonary disease. 2.  Aortic Atherosclerosis (ICD10-I70.0). Electronically Signed   By: Tish Frederickson M.D.   On: 02/03/2023 02:57   VAS [place] LOWER EXTREMITY VENOUS (DVT) (7a-7p)  Result Date: 02/02/2023  Lower Venous DVT Study Patient Name:  Sherri Mcmillan  Date of Exam:   02/02/2023 Medical Rec #: [id number]        Accession #:    [id number] Date of Birth: [birth year]        Patient Gender: F Patient Age:   63 years Exam Location:  [facility] Procedure:      VAS [place] LOWER EXTREMITY VENOUS (DVT) Referring Phys: [name] --------------------------------------------------------------------------------  Indications: Edema, and Swelling.  Risk Factors: Recent extended travel. Performing Technologist: Shona Simpson  Examination Guidelines: A complete evaluation includes B-mode imaging, spectral Doppler, color Doppler, and power Doppler as needed of all accessible portions of each vessel. Bilateral testing is considered an integral part of a complete examination. Limited examinations for reoccurring indications may be performed as noted. The reflux portion of the exam is performed with the patient in reverse Trendelenburg.  +-----+---------------+---------+-----------+----------+--------------+ RIGHTCompressibilityPhasicitySpontaneityPropertiesThrombus Aging +-----+---------------+---------+-----------+----------+--------------+ CFV  Full           Yes      Yes                                 +-----+---------------+---------+-----------+----------+--------------+   +---------+---------------+---------+-----------+----------+--------------+ LEFT     CompressibilityPhasicitySpontaneityPropertiesThrombus Aging +---------+---------------+---------+-----------+----------+--------------+ CFV      Full           Yes      Yes                                  +---------+---------------+---------+-----------+----------+--------------+ SFJ      Full                                                        +---------+---------------+---------+-----------+----------+--------------+ FV Prox  Full                                                        +---------+---------------+---------+-----------+----------+--------------+ FV Mid   Full                                                        +---------+---------------+---------+-----------+----------+--------------+  FV DistalFull                                                        +---------+---------------+---------+-----------+----------+--------------+ PFV      Full                                                        +---------+---------------+---------+-----------+----------+--------------+ POP      Full           Yes      Yes                                 +---------+---------------+---------+-----------+----------+--------------+ PTV      Full                                                        +---------+---------------+---------+-----------+----------+--------------+ PERO     Full                                                        +---------+---------------+---------+-----------+----------+--------------+     Summary: RIGHT: - No evidence of common femoral vein obstruction.   LEFT: - There is no evidence of deep vein thrombosis in the lower extremity.  - No cystic structure found in the popliteal fossa.  *See table(s) above for measurements and observations. Electronically signed by Coral Else MD on 02/02/2023 at 9:06:03 PM.    Final     Procedures Procedures    Medications Ordered in ED Medications  amLODipine (NORVASC) tablet 5 mg (5 mg Oral Given 02/03/23 0257)  HYDROcodone-acetaminophen (NORCO/VICODIN) 5-325 MG per tablet 1 tablet (1 tablet Oral Given 02/03/23 0314)  furosemide (LASIX) injection 20 mg (20 mg Intravenous Given 02/03/23  0644)  HYDROcodone-acetaminophen (NORCO/VICODIN) 5-325 MG per tablet 1 tablet (1 tablet Oral Given 02/03/23 0641)  hydrALAZINE (APRESOLINE) injection 5 mg (5 mg Intravenous Given 02/03/23 0642)  hydrALAZINE (APRESOLINE) injection 10 mg (10 mg Intravenous Given 02/03/23 0738)  ondansetron (ZOFRAN-ODT) disintegrating tablet 4 mg (4 mg Oral Given 02/03/23 0844)    ED Course/ Medical Decision Making/ A&P Clinical Course as of 02/03/23 0934  Thu Feb 03, 2023  0714 Admit [JD]    Clinical Course User Index [JD] Laurence Spates, MD                                 Medical Decision Making Amount and/or Complexity of Data Reviewed Labs: ordered. Radiology: ordered.  Risk Prescription drug management. Decision regarding hospitalization.   Pt with hx/o HTN, pAF not on anticoagulation here for BLE L>R for two weeks, also complains of intermittent CP for several months on ROS.  On exam she is well perfused with edema to BLE.  She has tenderness to the LLE and calf without skin changes.  Vascular US is neg for DVT.  CBC with anemia, no reported bleeding.  BNP is mildly elevated.  Pt significantly hypertensive in ED.  She is unsure of her BP meds but reports compliance - on record review she was on amlodipine and she was given this with no change in BP.  Given her edema she was treated with furosemide.  Pt significantly hypertensive compared to baseline, with new edema concern for chf vs hypertensive urgency and she was treated with hydralazine (HR 50s).  Plan to admit for ongoing medical treatment.          Final Clinical Impression(s) / ED Diagnoses Final diagnoses:  Peripheral edema    Rx / DC Orders ED Discharge Orders     None         Tilden Fossa, MD 02/03/23 [id number]

## 2023-02-03 NOTE — ED Notes (Signed)
ED TO INPATIENT HANDOFF REPORT  ED Nurse Name and Phone #: Robbi Garter Name/Age/Gender Sherri Mcmillan 89 y.o. female Room/Bed: WA07/WA07  Code Status   Code Status: Full Code  Home/SNF/Other Home Patient oriented to: self, place, time, and situation Is this baseline? Yes   Triage Complete: Triage complete  Chief Complaint Acute CHF (congestive heart failure) (HCC) [I50.9]  Triage Note Pt saw PCP today for bilateral leg swelling and left lower leg redness for 2 weeks. PCP concerned for blood clot. Pt has hx of afib and was on blood thinner, stopped it for years, and now will be placed back on it by PCP.    Allergies Allergies  Allergen Reactions   Aspirin Itching and Rash    Level of Care/Admitting Diagnosis ED Disposition     ED Disposition  Admit   Condition  --   Comment  Hospital Area: [facility] [[id number]]  Level of Care: Telemetry [5]  Admit to tele based on following criteria: Acute CHF  May admit patient to [facility] or [facility] if equivalent level of care is available:: Yes  Covid Evaluation: Asymptomatic - no recent exposure (last 10 days) testing not required  Diagnosis: Acute CHF (congestive heart failure) [facility]) [[id number]]  Admitting Physician: Maryln Gottron [[id number]]  Attending Physician: Olexa.Dam, MIR Jaxson.Roy [[id number]]  Certification:: I certify this patient will need inpatient services for at least 2 midnights  Expected Medical Readiness: 02/05/2023          B Medical/Surgery History Past Medical History:  Diagnosis Date   Anemia 12/14/2012   Ataxia 06/24/2016   Auditory hallucinations 08/19/2010   Has has auditory hallucinations, which seem to have worsened since death of her sister 01/14/10. Admission to [facility] (90s or early 2000)  for 6 weeks after mother died, also with hallucinations.  B/C of severe mental illness and refusal to see pysch, I have refused to refill controlled meds. If she decides to pursue mental health,  then she needs to take the initiative and sch the appt bc [name] has s   Barrett's esophagus    Demonstrated on EGD 15-Jan-2011. EGD 09/17/2012 shows inflamed GE junctional mucosa without metaplasia, dysplasia, or malignancy.   Cataract    Chronic anxiety    Admission to [facility] (90s or early 2000)  for 6 weeks after mother died. Complicated again by death of her sister 2010. 2012 developed hallucinations and I refused to refill controlled meds unless she see psych which she is not agreable to   Chronic insomnia    Depression    Diverticulosis    DVT (deep venous thrombosis) (HCC)    Gallstones    Gastroparesis    Demonstrated on GES 01-15-2011 by Dr Dalene Seltzer   GERD (gastroesophageal reflux disease)    describes " I sometimes have a hard time talking or swallowing" - relative to the reflux   Hiatal hernia    HLD (hyperlipidemia) 01/2010   HTN (hypertension)    Controlled with 2 drug therapy   Insomnia 07/27/2006   Left leg DVT (HCC) 09/28/2012   Migraine    Pulmonary nodule 02/10/2014   CT 06/2015 shows stable pulm nodules compared to prior study 2015, so likely benign.      S/P TAVR (transcatheter aortic valve replacement) 05/30/2018   s/p 26 mm Medtronic Evolut Pro Plus via TF approach on 05/30/18   Severe aortic stenosis    Past Surgical History:  Procedure Laterality Date   24 HOUR  PH STUDY N/A 08/24/2017   Procedure: 24 HOUR PH STUDY IMPEDANCE;  Surgeon: Sherrilyn Rist, MD;  Location: [facility] ENDOSCOPY;  Service: Gastroenterology;  Laterality: N/A;   ABDOMINAL HYSTERECTOMY     APPENDECTOMY     CARDIAC CATHETERIZATION     CATARACT EXTRACTION     left eye   COLONOSCOPY  2000&2005   Dr.Magod   DIRECT LARYNGOSCOPY   September 2008    preoperative diagnosis hoarseness with anterior right vocal cord lesion -  direct laryngoscopy and excisional biopsy of right anterior vocal cord lesion done by Dr. Ezzard Standing   ENDOVENOUS ABLATION SAPHENOUS VEIN W/ LASER Left 05-09-2014   EVLA left small saphenous  vein by Gretta Began MD   ESOPHAGEAL MANOMETRY N/A 08/24/2017   Procedure: ESOPHAGEAL MANOMETRY (EM);  Surgeon: Sherrilyn Rist, MD;  Location: [facility] ENDOS[facility];  Service: Gastroenterology;  Laterality: N/A;   ESOPHAGOGASTRODUODENOSCOPY  11/2010   ESOPHAGOGASTRODUODENOSCOPY N/A 09/17/2012   Procedure: ESOPHAGOGASTRODUODENOSCOPY (EGD);  Surgeon: Hart Carwin, MD;  Location: [facility] ENDOSCOPY;  Service: Endoscopy;  Laterality: N/A;   ESOPHAGOGASTRODUODENOSCOPY N/A 12/17/2012   Procedure: ESOPHAGOGASTRODUODENOSCOPY (EGD);  Surgeon: Beverley Fiedler, MD;  Location: [facility] ENDOSCOPY;  Service: Gastroenterology;  Laterality: N/A;   EXCISION MASS HEAD N/A 03/05/2016   Procedure: EXCISION POSTERIOR SCALP MASS;  Surgeon: Axel Filler, MD;  Location: [facility] OR;  Service: General;  Laterality: N/A;   EYE SURGERY Bilateral    cataracts removed-    HEMORRHOID SURGERY     HIATAL HERNIA REPAIR N/A 12/01/2018   Procedure: LAPAROSCOPIC TAKEDOWN HIATAL HERNIA NISEEN REDUCTION AND UPPER ENDOSCOPY;  Surgeon: Luretha Murphy, MD;  Location: [facility] ORS;  Service: General;  Laterality: N/A;   KNEE ARTHROSCOPY     LAPAROSCOPIC NISSEN FUNDOPLICATION N/A 05/02/2013   Procedure: LAPAROSCOPIC NISSEN FUNDOPLICATION;  Surgeon: Valarie Merino, MD;  Location: [facility] ORS;  Service: General;  Laterality: N/A;   MENISCECTOMY   July 2002    preoperative diagnosis torn medial meniscus right knee, partial medial meniscectomy, debridement chondroplasty patellofemoral joint, done by Dr. Madelon Lips   POLYPECTOMY  2000   Dr.Magod   RIGHT/LEFT HEART CATH AND CORONARY ANGIOGRAPHY N/A 04/06/2018   Procedure: RIGHT/LEFT HEART CATH AND CORONARY ANGIOGRAPHY;  Surgeon: Kathleene Hazel, MD;  [facility]: [facility] INVASIVE CV LAB;  Service: Cardiovascular;  Laterality: N/A;   ROTATOR CUFF REPAIR  09/21/2011   rt shoulder   TEE WITHOUT CARDIOVERSION N/A 05/30/2018   Procedure: TRANSESOPHAGEAL ECHOCARDIOGRAM (TEE);  Surgeon: Kathleene Hazel, MD;  Location: [facility] OR;   Service: Open Heart Surgery;  Laterality: N/A;   TOOTH EXTRACTION Bilateral 04/05/2018   Procedure: Extraction of tooth #'s 6 and 29 with alveoloplasty and gross debridement of remaining teeth;  Surgeon: Charlynne Pander, DDS;  Location: [facility] OR;  Service: Oral Surgery;  Laterality: Bilateral;   TRANSCATHETER AORTIC VALVE REPLACEMENT, TRANSFEMORAL N/A 05/30/2018   Procedure: TRANSCATHETER AORTIC VALVE REPLACEMENT, TRANSFEMORAL - MEDTRONIC VALVE;  Surgeon: Kathleene Hazel, MD;  Location: [facility] OR;  Service: Open Heart Surgery;  Laterality: N/A;     A IV Location/Drains/Wounds Patient Lines/Drains/Airways Status     Active Line/Drains/Airways     Name Placement date Placement time Site Days   Peripheral IV 02/03/23 20 G 1" Left Antecubital 02/03/23  0200  Antecubital  less than 1            Intake/Output Last 24 hours No intake or output data in the 24 hours ending 02/03/23 1619  Labs/Imaging Results for orders placed or performed  during the hospital encounter of 02/02/23 (from the past 48 hour(s))  CBC with Differential     Status: Abnormal   Collection Time: 02/02/23  7:23 PM  Result Value Ref Range   WBC 3.4 (L) 4.0 - 10.5 K/uL   RBC 3.83 (L) 3.87 - 5.11 MIL/uL   Hemoglobin 10.5 (L) 12.0 - 15.0 g/dL   HCT 02.7 (L) 25.3 - 66.4 %   MCV 88.5 80.0 - 100.0 fL   MCH 27.4 26.0 - 34.0 pg   MCHC 31.0 30.0 - 36.0 g/dL   RDW 40.3 47.4 - 25.9 %   Platelets 130 (L) 150 - 400 K/uL    Comment: SPECIMEN CHECKED FOR CLOTS CONSISTENT WITH PREVIOUS RESULT REPEATED TO VERIFY    nRBC 0.0 0.0 - 0.2 %   Neutrophils Relative % 50 %   Neutro Abs 1.7 1.7 - 7.7 K/uL   Lymphocytes Relative 34 %   Lymphs Abs 1.2 0.7 - 4.0 K/uL   Monocytes Relative 14 %   Monocytes Absolute 0.5 0.1 - 1.0 K/uL   Eosinophils Relative 1 %   Eosinophils Absolute 0.0 0.0 - 0.5 K/uL   Basophils Relative 1 %   Basophils Absolute 0.0 0.0 - 0.1 K/uL   Immature Granulocytes 0 %   Abs Immature Granulocytes 0.01  0.00 - 0.07 K/uL    Comment: Performed at [facility], [address], [place], [place] [address]  Comprehensive metabolic panel     Status: None   Collection Time: 02/02/23  7:23 PM  Result Value Ref Range   Sodium 137 135 - 145 mmol/L   Potassium 3.7 3.5 - 5.1 mmol/L   Chloride 104 98 - 111 mmol/L   CO2 25 22 - 32 mmol/L   Glucose, Bld 90 70 - 99 mg/dL    Comment: Glucose reference range applies only to samples taken after fasting for at least 8 hours.   BUN 18 8 - 23 mg/dL   Creatinine, Ser 5.64 0.44 - 1.00 mg/dL   Calcium 8.9 8.9 - 33.2 mg/dL   Total Protein 7.8 6.5 - 8.1 g/dL   Albumin 4.4 3.5 - 5.0 g/dL   AST 22 15 - 41 U/L   ALT 13 0 - 44 U/L   Alkaline Phosphatase 67 38 - 126 U/L   Total Bilirubin 0.8 0.3 - 1.2 mg/dL   GFR, Estimated >95 >18 mL/min    Comment: (NOTE) Calculated using the CKD-EPI Creatinine Equation (2021)    Anion gap 8 5 - 15    Comment: Performed at [facility], [address] [place], [place] [address]  Brain natriuretic peptide     Status: Abnormal   Collection Time: 02/03/23  2:02 AM  Result Value Ref Range   B Natriuretic Peptide 259.1 (H) 0.0 - 100.0 pg/mL    Comment: Performed at [facility], [address] [place], [place] [address]  Troponin I (High Sensitivity)     Status: None   Collection Time: 02/03/23  2:02 AM  Result Value Ref Range   Troponin I (High Sensitivity) 16 <18 ng/L    Comment: (NOTE) Elevated high sensitivity troponin I (hsTnI) values and significant  changes across serial measurements may suggest ACS but many other  chronic and acute conditions are known to elevate hsTnI results.  Refer to the "Links" section for chest pain algorithms and additional  guidance. Performed at [facility], [address], [place], [place] [address]   Troponin I (  High Sensitivity)     Status: None   Collection Time: 02/03/23  6:14 AM  Result Value Ref Range    Troponin I (High Sensitivity) 17 <18 ng/L    Comment: (NOTE) Elevated high sensitivity troponin I (hsTnI) values and significant  changes across serial measurements may suggest ACS but many other  chronic and acute conditions are known to elevate hsTnI results.  Refer to the "Links" section for chest pain algorithms and additional  guidance. Performed at [facility], [address], [place], [place] [address]   CK     Status: None   Collection Time: 02/03/23  6:14 AM  Result Value Ref Range   Total CK 106 38 - 234 U/L    Comment: Performed at [facility], [address] [place], [place] [address]  POC CBG, ED     Status: None   Collection Time: 02/03/23 11:01 AM  Result Value Ref Range   Glucose-Capillary 85 70 - 99 mg/dL    Comment: Glucose reference range applies only to samples taken after fasting for at least 8 hours.  TSH     Status: None   Collection Time: 02/03/23 11:55 AM  Result Value Ref Range   TSH 1.322 0.350 - 4.500 uIU/mL    Comment: Performed by a 3rd Generation assay with a functional sensitivity of <=0.01 uIU/mL. Performed at [facility], [address] [place], [place] [address]    DG Chest 2 View  Result Date: 02/03/2023 CLINICAL DATA:  chest pain EXAM: CHEST - 2 VIEW COMPARISON:  Chest x-ray 06/13/2022, CT angio chest 05/01/2018 FINDINGS: The heart and mediastinal contours are unchanged. Aortic calcification. Aortic valve replacement. Flattening of the hemidiaphragms with nonspecific blunting of the costophrenic angles. No focal consolidation. No pulmonary edema. No pleural effusion. No pneumothorax. No acute osseous abnormality. IMPRESSION: 1. No active cardiopulmonary disease. 2.  Aortic Atherosclerosis (ICD10-I70.0). Electronically Signed   By: Tish Frederickson M.D.   On: 02/03/2023 02:57   VAS [place] LOWER EXTREMITY VENOUS (DVT) (7a-7p)  Result Date: 02/02/2023  Lower Venous DVT Study Patient Name:   Sherri Mcmillan  Date of Exam:   02/02/2023 Medical Rec #: [id number]        Accession #:    [id number] Date of Birth: 08/18/1932        Patient Gender: F Patient Age:   12 years Exam Location:  [facility] Procedure:      VAS [place] LOWER EXTREMITY VENOUS (DVT) Referring Phys: [name] --------------------------------------------------------------------------------  Indications: Edema, and Swelling.  Risk Factors: Recent extended travel. Performing Technologist: Shona Simpson  Examination Guidelines: A complete evaluation includes B-mode imaging, spectral Doppler, color Doppler, and power Doppler as needed of all accessible portions of each vessel. Bilateral testing is considered an integral part of a complete examination. Limited examinations for reoccurring indications may be performed as noted. The reflux portion of the exam is performed with the patient in reverse Trendelenburg.  +-----+---------------+---------+-----------+----------+--------------+ RIGHTCompressibilityPhasicitySpontaneityPropertiesThrombus Aging +-----+---------------+---------+-----------+----------+--------------+ CFV  Full           Yes      Yes                                 +-----+---------------+---------+-----------+----------+--------------+   +---------+---------------+---------+-----------+----------+--------------+ LEFT     CompressibilityPhasicitySpontaneityPropertiesThrombus Aging +---------+---------------+---------+-----------+----------+--------------+ CFV      Full           Yes  Yes                                 +---------+---------------+---------+-----------+----------+--------------+ SFJ      Full                                                        +---------+---------------+---------+-----------+----------+--------------+ FV Prox  Full                                                         +---------+---------------+---------+-----------+----------+--------------+ FV Mid   Full                                                        +---------+---------------+---------+-----------+----------+--------------+ FV DistalFull                                                        +---------+---------------+---------+-----------+----------+--------------+ PFV      Full                                                        +---------+---------------+---------+-----------+----------+--------------+ POP      Full           Yes      Yes                                 +---------+---------------+---------+-----------+----------+--------------+ PTV      Full                                                        +---------+---------------+---------+-----------+----------+--------------+ PERO     Full                                                        +---------+---------------+---------+-----------+----------+--------------+     Summary: RIGHT: - No evidence of common femoral vein obstruction.   LEFT: - There is no evidence of deep vein thrombosis in the lower extremity.  - No cystic structure found in the popliteal fossa.  *See table(s) above for measurements and observations. Electronically signed by Coral Else MD on 02/02/2023 at 9:06:03 PM.    Final     Pending Labs Unresulted Labs (From admission, onward)  Start     Ordered   02/04/23 0500  Basic metabolic panel  Tomorrow morning,   R        02/03/23 1006   02/04/23 0500  CBC  Tomorrow morning,   R        02/03/23 1006            Vitals/Pain Today's Vitals   02/03/23 1345 02/03/23 1400 02/03/23 1605 02/03/23 1607  BP:  131/81 (!) 152/95   Pulse:  75 75   Resp:  16 (!) 21   Temp: 97.7 F (36.5 C)   98.2 F (36.8 C)  TempSrc: Oral   Oral  SpO2:  99% 99%   Weight:      Height:      PainSc:        Isolation Precautions No active isolations  Medications Medications   QUEtiapine (SEROQUEL) tablet 25 mg (has no administration in time range)  brimonidine (ALPHAGAN) 0.2 % ophthalmic solution 1 drop (1 drop Left Eye Given 02/03/23 1606)  dorzolamide-timolol (COSOPT) 2-0.5 % ophthalmic solution 1 drop (1 drop Left Eye Given 02/03/23 1606)  latanoprost (XALATAN) 0.005 % ophthalmic solution 1 drop (has no administration in time range)  acetaminophen (TYLENOL) tablet 650 mg (650 mg Oral Given 02/03/23 1342)    Or  acetaminophen (TYLENOL) suppository 650 mg ( Rectal See Alternative 02/03/23 1342)  traZODone (DESYREL) tablet 25 mg (has no administration in time range)  ondansetron (ZOFRAN) tablet 4 mg (has no administration in time range)    Or  ondansetron (ZOFRAN) injection 4 mg (has no administration in time range)  albuterol (PROVENTIL) (2.5 MG/3ML) 0.083% nebulizer solution 2.5 mg (has no administration in time range)  hydrALAZINE (APRESOLINE) injection 5 mg (has no administration in time range)  apixaban (ELIQUIS) tablet 2.5 mg (2.5 mg Oral Given 02/03/23 1146)  hydrOXYzine (ATARAX) tablet 25 mg (has no administration in time range)  gabapentin (NEURONTIN) capsule 100 mg (100 mg Oral Given 02/03/23 1616)  amLODipine (NORVASC) tablet 5 mg (5 mg Oral Given 02/03/23 0257)  HYDROcodone-acetaminophen (NORCO/VICODIN) 5-325 MG per tablet 1 tablet (1 tablet Oral Given 02/03/23 0314)  furosemide (LASIX) injection 20 mg (20 mg Intravenous Given 02/03/23 0644)  HYDROcodone-acetaminophen (NORCO/VICODIN) 5-325 MG per tablet 1 tablet (1 tablet Oral Given 02/03/23 0641)  hydrALAZINE (APRESOLINE) injection 5 mg (5 mg Intravenous Given 02/03/23 0642)  hydrALAZINE (APRESOLINE) injection 10 mg (10 mg Intravenous Given 02/03/23 0738)  ondansetron (ZOFRAN-ODT) disintegrating tablet 4 mg (4 mg Oral Given 02/03/23 0844)    Mobility walks with person assist     Focused Assessments     R Recommendations: See Admitting Provider Note  Report given to:   Additional Notes:

## 2023-02-03 NOTE — Consult Note (Addendum)
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 appropriately with a normal affect.   EKG:  The EKG was personally reviewed and demonstrates:   EKG shows SB 56bpm with low P wave amplitude best seen in left side of tracing, nonspecific STTW changes F/u EKG suggestive of arm lead reversal.   Telemetry:  Telemetry was personally reviewed and demonstrates:  NSR, occ PACs, PVCs  Relevant CV Studies: See scanned echo  Laboratory Data:  High Sensitivity Troponin:   Recent Labs  Lab  02/03/23 0202 02/03/23 0614  TROPONINIHS 16 17     Chemistry Recent Labs  Lab 02/02/23 1923  NA 137  K 3.7  CL 104  CO2 25  GLUCOSE 90  BUN 18  CREATININE 0.83  CALCIUM 8.9  GFRNONAA >60  ANIONGAP 8    Recent Labs  Lab 02/02/23 1923  PROT 7.8  ALBUMIN 4.4  AST 22  ALT 13  ALKPHOS 67  BILITOT 0.8   Lipids No results for input(s): "CHOL", "TRIG", "HDL", "LABVLDL", "LDLCALC", "CHOLHDL" in the last 168 hours.  Hematology Recent Labs  Lab 02/02/23 1923  WBC 3.4*  RBC 3.83*  HGB 10.5*  HCT 33.9*  MCV 88.5  MCH 27.4  MCHC 31.0  RDW 13.5  PLT 130*   Thyroid No results for input(s): "TSH", "FREET4" in the last 168 hours.  BNP Recent Labs  Lab 02/03/23 0202  BNP 259.1*    DDimer No results for input(s): "DDIMER" in the last 168 hours.   Radiology/Studies:  DG Chest 2 View  Result Date: 02/03/2023 CLINICAL DATA:  chest pain EXAM: CHEST - 2 VIEW COMPARISON:  Chest x-ray 06/13/2022, CT angio chest 05/01/2018 FINDINGS: The heart and mediastinal contours are unchanged. Aortic calcification. Aortic valve replacement. Flattening of the hemidiaphragms with nonspecific blunting of the costophrenic angles. No focal consolidation. No pulmonary edema. No pleural effusion. No pneumothorax. No acute osseous abnormality. IMPRESSION: 1. No active cardiopulmonary disease. 2.  Aortic Atherosclerosis (ICD10-I70.0). Electronically Signed   By: Tish Frederickson M.D.   On: 02/03/2023 02:57   VAS [place] LOWER EXTREMITY VENOUS (DVT) (7a-7p)  Result Date: 02/02/2023  Lower Venous DVT Study Patient Name:  CLARETTA KENDRA  Date of Exam:   02/02/2023 Medical Rec #: [id number]        Accession #:    [id number] Date of Birth: 1932-07-24        Patient Gender: F Patient Age:   82 years Exam Location:  [facility] Procedure:      VAS [place] LOWER EXTREMITY VENOUS (DVT) Referring Phys: [name] --------------------------------------------------------------------------------  Indications:  Edema, and Swelling.  Risk Factors: Recent extended travel. Performing Technologist: Shona Simpson  Examination Guidelines: A complete evaluation includes B-mode imaging, spectral Doppler, color Doppler, and power Doppler as needed of all accessible portions of each vessel. Bilateral testing is considered an integral part of a complete examination. Limited examinations for reoccurring indications may be performed as noted. The reflux portion of the exam is performed with the patient in reverse Trendelenburg.  +-----+---------------+---------+-----------+----------+--------------+ RIGHTCompressibilityPhasicitySpontaneityPropertiesThrombus Aging +-----+---------------+---------+-----------+----------+--------------+ CFV  Full           Yes      Yes                                 +-----+---------------+---------+-----------+----------+--------------+   +---------+---------------+---------+-----------+----------+--------------+ LEFT     CompressibilityPhasicitySpontaneityPropertiesThrombus Aging +---------+---------------+---------+-----------+----------+--------------+ CFV      Full

## 2023-02-03 NOTE — H&P (Signed)
History and Physical  Sherri Mcmillan MVH:[id number] DOB: 1932-06-12 DOA: 02/02/2023  PCP: Cain Saupe, MD   Chief Complaint: Leg swelling, chest pain  HPI: Sherri Mcmillan is a 89 y.o. female with medical history significant for chronic anxiety, depression, hypertension, sinus bradycardia, CAD, DVT, paroxysmal atrial fibrillation not on Eliquis, status post TAVR 2020 being admitted to the hospital with hypertensive urgency and concern for new onset heart failure.  Patient states over the last couple of weeks, she has had bilateral lower extremity pain and swelling, as well as dyspnea with exertion and chest pain.  States the chest pain has been going on for the last couple of weeks, seems to be intermittent, worsened with deep breaths.  She denies any injury.  States that she was started on Eliquis several years ago, and then again last year when she was discovered to have atrial fibrillation.  Seems she was unable to tolerate the medication and has not been taking the Eliquis.  Apparently she saw her PCP earlier today for the above complaints, was sent to the ER for further evaluation and specifically to rule out recurrent DVT.  ED Course: On evaluation here in the emergency department, she has been afebrile with heart rate in the 50s.  Blood pressure as high as 236/113.  CBC and CMP were obtained, as well as BNP and troponin.  Troponins are negative x 2, BNP 259.  Hemoglobin 11 which appears to be her baseline.  She had lower extremity Dopplers which ruled out DVT, and chest x-ray as detailed below.  She was given a dose of IV Lasix 20 mg, as well as IV hydralazine and oral amlodipine.  No beta-blocker was given due to bradycardia.  Her blood pressure has now significantly improved, she tells me that her lower extremity edema is maybe slightly improved as well after Lasix.  Review of Systems: Please see HPI for pertinent positives and negatives. A complete 10 system review of systems are otherwise  negative.  Past Medical History:  Diagnosis Date   Anemia 12/14/2012   Ataxia 06/24/2016   Auditory hallucinations 08/19/2010   Has has auditory hallucinations, which seem to have worsened since death of her sister 01/26/2010. Admission to [facility] (90s or early 2000)  for 6 weeks after mother died, also with hallucinations.  B/C of severe mental illness and refusal to see pysch, I have refused to refill controlled meds. If she decides to pursue mental health, then she needs to take the initiative and sch the appt bc [name] has s   Barrett's esophagus    Demonstrated on EGD 01/27/2011. EGD 09/17/2012 shows inflamed GE junctional mucosa without metaplasia, dysplasia, or malignancy.   Cataract    Chronic anxiety    Admission to [facility] (90s or early 2000)  for 6 weeks after mother died. Complicated again by death of her sister 2010. 2012 developed hallucinations and I refused to refill controlled meds unless she see psych which she is not agreable to   Chronic insomnia    Depression    Diverticulosis    DVT (deep venous thrombosis) (HCC)    Gallstones    Gastroparesis    Demonstrated on GES 01-27-2011 by Dr Dalene Seltzer   GERD (gastroesophageal reflux disease)    describes " I sometimes have a hard time talking or swallowing" - relative to the reflux   Hiatal hernia    HLD (hyperlipidemia) 01/2010   HTN (hypertension)    Controlled with 2 drug therapy  History and Physical  Sherri Mcmillan MVH:[id number] DOB: 1932-06-12 DOA: 02/02/2023  PCP: Cain Saupe, MD   Chief Complaint: Leg swelling, chest pain  HPI: Sherri Mcmillan is a 89 y.o. female with medical history significant for chronic anxiety, depression, hypertension, sinus bradycardia, CAD, DVT, paroxysmal atrial fibrillation not on Eliquis, status post TAVR 2020 being admitted to the hospital with hypertensive urgency and concern for new onset heart failure.  Patient states over the last couple of weeks, she has had bilateral lower extremity pain and swelling, as well as dyspnea with exertion and chest pain.  States the chest pain has been going on for the last couple of weeks, seems to be intermittent, worsened with deep breaths.  She denies any injury.  States that she was started on Eliquis several years ago, and then again last year when she was discovered to have atrial fibrillation.  Seems she was unable to tolerate the medication and has not been taking the Eliquis.  Apparently she saw her PCP earlier today for the above complaints, was sent to the ER for further evaluation and specifically to rule out recurrent DVT.  ED Course: On evaluation here in the emergency department, she has been afebrile with heart rate in the 50s.  Blood pressure as high as 236/113.  CBC and CMP were obtained, as well as BNP and troponin.  Troponins are negative x 2, BNP 259.  Hemoglobin 11 which appears to be her baseline.  She had lower extremity Dopplers which ruled out DVT, and chest x-ray as detailed below.  She was given a dose of IV Lasix 20 mg, as well as IV hydralazine and oral amlodipine.  No beta-blocker was given due to bradycardia.  Her blood pressure has now significantly improved, she tells me that her lower extremity edema is maybe slightly improved as well after Lasix.  Review of Systems: Please see HPI for pertinent positives and negatives. A complete 10 system review of systems are otherwise  negative.  Past Medical History:  Diagnosis Date   Anemia 12/14/2012   Ataxia 06/24/2016   Auditory hallucinations 08/19/2010   Has has auditory hallucinations, which seem to have worsened since death of her sister 01/26/2010. Admission to [facility] (90s or early 2000)  for 6 weeks after mother died, also with hallucinations.  B/C of severe mental illness and refusal to see pysch, I have refused to refill controlled meds. If she decides to pursue mental health, then she needs to take the initiative and sch the appt bc [name] has s   Barrett's esophagus    Demonstrated on EGD 01/27/2011. EGD 09/17/2012 shows inflamed GE junctional mucosa without metaplasia, dysplasia, or malignancy.   Cataract    Chronic anxiety    Admission to [facility] (90s or early 2000)  for 6 weeks after mother died. Complicated again by death of her sister 2010. 2012 developed hallucinations and I refused to refill controlled meds unless she see psych which she is not agreable to   Chronic insomnia    Depression    Diverticulosis    DVT (deep venous thrombosis) (HCC)    Gallstones    Gastroparesis    Demonstrated on GES 01-27-2011 by Dr Dalene Seltzer   GERD (gastroesophageal reflux disease)    describes " I sometimes have a hard time talking or swallowing" - relative to the reflux   Hiatal hernia    HLD (hyperlipidemia) 01/2010   HTN (hypertension)    Controlled with 2 drug therapy  History and Physical  Sherri Mcmillan MVH:[id number] DOB: 1932-06-12 DOA: 02/02/2023  PCP: Cain Saupe, MD   Chief Complaint: Leg swelling, chest pain  HPI: Sherri Mcmillan is a 89 y.o. female with medical history significant for chronic anxiety, depression, hypertension, sinus bradycardia, CAD, DVT, paroxysmal atrial fibrillation not on Eliquis, status post TAVR 2020 being admitted to the hospital with hypertensive urgency and concern for new onset heart failure.  Patient states over the last couple of weeks, she has had bilateral lower extremity pain and swelling, as well as dyspnea with exertion and chest pain.  States the chest pain has been going on for the last couple of weeks, seems to be intermittent, worsened with deep breaths.  She denies any injury.  States that she was started on Eliquis several years ago, and then again last year when she was discovered to have atrial fibrillation.  Seems she was unable to tolerate the medication and has not been taking the Eliquis.  Apparently she saw her PCP earlier today for the above complaints, was sent to the ER for further evaluation and specifically to rule out recurrent DVT.  ED Course: On evaluation here in the emergency department, she has been afebrile with heart rate in the 50s.  Blood pressure as high as 236/113.  CBC and CMP were obtained, as well as BNP and troponin.  Troponins are negative x 2, BNP 259.  Hemoglobin 11 which appears to be her baseline.  She had lower extremity Dopplers which ruled out DVT, and chest x-ray as detailed below.  She was given a dose of IV Lasix 20 mg, as well as IV hydralazine and oral amlodipine.  No beta-blocker was given due to bradycardia.  Her blood pressure has now significantly improved, she tells me that her lower extremity edema is maybe slightly improved as well after Lasix.  Review of Systems: Please see HPI for pertinent positives and negatives. A complete 10 system review of systems are otherwise  negative.  Past Medical History:  Diagnosis Date   Anemia 12/14/2012   Ataxia 06/24/2016   Auditory hallucinations 08/19/2010   Has has auditory hallucinations, which seem to have worsened since death of her sister 01/26/2010. Admission to [facility] (90s or early 2000)  for 6 weeks after mother died, also with hallucinations.  B/C of severe mental illness and refusal to see pysch, I have refused to refill controlled meds. If she decides to pursue mental health, then she needs to take the initiative and sch the appt bc [name] has s   Barrett's esophagus    Demonstrated on EGD 01/27/2011. EGD 09/17/2012 shows inflamed GE junctional mucosa without metaplasia, dysplasia, or malignancy.   Cataract    Chronic anxiety    Admission to [facility] (90s or early 2000)  for 6 weeks after mother died. Complicated again by death of her sister 2010. 2012 developed hallucinations and I refused to refill controlled meds unless she see psych which she is not agreable to   Chronic insomnia    Depression    Diverticulosis    DVT (deep venous thrombosis) (HCC)    Gallstones    Gastroparesis    Demonstrated on GES 01-27-2011 by Dr Dalene Seltzer   GERD (gastroesophageal reflux disease)    describes " I sometimes have a hard time talking or swallowing" - relative to the reflux   Hiatal hernia    HLD (hyperlipidemia) 01/2010   HTN (hypertension)    Controlled with 2 drug therapy  1923  AST 22  ALT 13  ALKPHOS 67  BILITOT 0.8  PROT 7.8  ALBUMIN 4.4   No results for input(s): "LIPASE", "AMYLASE" in the last 168 hours. No results for input(s): "AMMONIA" in the last 168 hours. CBC: Recent Labs  Lab 02/02/23 1923  WBC 3.4*  NEUTROABS 1.7  HGB 10.5*  HCT 33.9*  MCV 88.5  PLT 130*   Cardiac Enzymes: No results for input(s): "CKTOTAL", "CKMB", "CKMBINDEX", "TROPONINI" in the last 168 hours.  BNP (last 3 results) Recent Labs    02/03/23 0202  BNP 259.1*    ProBNP (last 3 results) No results for input(s): "PROBNP" in the last 8760 hours.  CBG: No results for input(s): "GLUCAP" in the last 168 hours.  Radiological Exams on Admission: DG Chest 2 View  Result Date: 02/03/2023 CLINICAL DATA:  chest pain EXAM: CHEST - 2 VIEW COMPARISON:  Chest x-ray 06/13/2022, CT angio chest 05/01/2018 FINDINGS: The heart and mediastinal contours are unchanged. Aortic calcification. Aortic valve replacement. Flattening of the hemidiaphragms with nonspecific blunting of the costophrenic angles. No focal consolidation. No pulmonary edema. No pleural effusion. No pneumothorax. No acute osseous abnormality. IMPRESSION: 1. No active cardiopulmonary disease. 2.  Aortic Atherosclerosis (ICD10-I70.0). Electronically Signed   By: Tish Frederickson M.D.   On: 02/03/2023 02:57   VAS [place] LOWER EXTREMITY VENOUS (DVT) (7a-7p)  Result Date: 02/02/2023  Lower Venous DVT Study Patient Name:  Sherri Mcmillan  Date of Exam:   02/02/2023 Medical Rec #: [id number]        Accession #:    [id number] Date of Birth: 07/03/1932        Patient Gender: F Patient Age:   88 years Exam Location:  [facility] Procedure:       VAS [place] LOWER EXTREMITY VENOUS (DVT) Referring Phys: [name] --------------------------------------------------------------------------------  Indications: Edema, and Swelling.  Risk Factors: Recent extended travel. Performing Technologist: Shona Simpson  Examination Guidelines: A complete evaluation includes B-mode imaging, spectral Doppler, color Doppler, and power Doppler as needed of all accessible portions of each vessel. Bilateral testing is considered an integral part of a complete examination. Limited examinations for reoccurring indications may be performed as noted. The reflux portion of the exam is performed with the patient in reverse Trendelenburg.  +-----+---------------+---------+-----------+----------+--------------+ RIGHTCompressibilityPhasicitySpontaneityPropertiesThrombus Aging +-----+---------------+---------+-----------+----------+--------------+ CFV  Full           Yes      Yes                                 +-----+---------------+---------+-----------+----------+--------------+   +---------+---------------+---------+-----------+----------+--------------+ LEFT     CompressibilityPhasicitySpontaneityPropertiesThrombus Aging +---------+---------------+---------+-----------+----------+--------------+ CFV      Full           Yes      Yes                                 +---------+---------------+---------+-----------+----------+--------------+ SFJ      Full                                                        +---------+---------------+---------+-----------+----------+--------------+ FV Prox  Full                                                        +---------+---------------+---------+-----------+----------+--------------+  History and Physical  Sherri Mcmillan MVH:[id number] DOB: 1932-06-12 DOA: 02/02/2023  PCP: Cain Saupe, MD   Chief Complaint: Leg swelling, chest pain  HPI: Sherri Mcmillan is a 89 y.o. female with medical history significant for chronic anxiety, depression, hypertension, sinus bradycardia, CAD, DVT, paroxysmal atrial fibrillation not on Eliquis, status post TAVR 2020 being admitted to the hospital with hypertensive urgency and concern for new onset heart failure.  Patient states over the last couple of weeks, she has had bilateral lower extremity pain and swelling, as well as dyspnea with exertion and chest pain.  States the chest pain has been going on for the last couple of weeks, seems to be intermittent, worsened with deep breaths.  She denies any injury.  States that she was started on Eliquis several years ago, and then again last year when she was discovered to have atrial fibrillation.  Seems she was unable to tolerate the medication and has not been taking the Eliquis.  Apparently she saw her PCP earlier today for the above complaints, was sent to the ER for further evaluation and specifically to rule out recurrent DVT.  ED Course: On evaluation here in the emergency department, she has been afebrile with heart rate in the 50s.  Blood pressure as high as 236/113.  CBC and CMP were obtained, as well as BNP and troponin.  Troponins are negative x 2, BNP 259.  Hemoglobin 11 which appears to be her baseline.  She had lower extremity Dopplers which ruled out DVT, and chest x-ray as detailed below.  She was given a dose of IV Lasix 20 mg, as well as IV hydralazine and oral amlodipine.  No beta-blocker was given due to bradycardia.  Her blood pressure has now significantly improved, she tells me that her lower extremity edema is maybe slightly improved as well after Lasix.  Review of Systems: Please see HPI for pertinent positives and negatives. A complete 10 system review of systems are otherwise  negative.  Past Medical History:  Diagnosis Date   Anemia 12/14/2012   Ataxia 06/24/2016   Auditory hallucinations 08/19/2010   Has has auditory hallucinations, which seem to have worsened since death of her sister 01/26/2010. Admission to [facility] (90s or early 2000)  for 6 weeks after mother died, also with hallucinations.  B/C of severe mental illness and refusal to see pysch, I have refused to refill controlled meds. If she decides to pursue mental health, then she needs to take the initiative and sch the appt bc [name] has s   Barrett's esophagus    Demonstrated on EGD 01/27/2011. EGD 09/17/2012 shows inflamed GE junctional mucosa without metaplasia, dysplasia, or malignancy.   Cataract    Chronic anxiety    Admission to [facility] (90s or early 2000)  for 6 weeks after mother died. Complicated again by death of her sister 2010. 2012 developed hallucinations and I refused to refill controlled meds unless she see psych which she is not agreable to   Chronic insomnia    Depression    Diverticulosis    DVT (deep venous thrombosis) (HCC)    Gallstones    Gastroparesis    Demonstrated on GES 01-27-2011 by Dr Dalene Seltzer   GERD (gastroesophageal reflux disease)    describes " I sometimes have a hard time talking or swallowing" - relative to the reflux   Hiatal hernia    HLD (hyperlipidemia) 01/2010   HTN (hypertension)    Controlled with 2 drug therapy  1923  AST 22  ALT 13  ALKPHOS 67  BILITOT 0.8  PROT 7.8  ALBUMIN 4.4   No results for input(s): "LIPASE", "AMYLASE" in the last 168 hours. No results for input(s): "AMMONIA" in the last 168 hours. CBC: Recent Labs  Lab 02/02/23 1923  WBC 3.4*  NEUTROABS 1.7  HGB 10.5*  HCT 33.9*  MCV 88.5  PLT 130*   Cardiac Enzymes: No results for input(s): "CKTOTAL", "CKMB", "CKMBINDEX", "TROPONINI" in the last 168 hours.  BNP (last 3 results) Recent Labs    02/03/23 0202  BNP 259.1*    ProBNP (last 3 results) No results for input(s): "PROBNP" in the last 8760 hours.  CBG: No results for input(s): "GLUCAP" in the last 168 hours.  Radiological Exams on Admission: DG Chest 2 View  Result Date: 02/03/2023 CLINICAL DATA:  chest pain EXAM: CHEST - 2 VIEW COMPARISON:  Chest x-ray 06/13/2022, CT angio chest 05/01/2018 FINDINGS: The heart and mediastinal contours are unchanged. Aortic calcification. Aortic valve replacement. Flattening of the hemidiaphragms with nonspecific blunting of the costophrenic angles. No focal consolidation. No pulmonary edema. No pleural effusion. No pneumothorax. No acute osseous abnormality. IMPRESSION: 1. No active cardiopulmonary disease. 2.  Aortic Atherosclerosis (ICD10-I70.0). Electronically Signed   By: Tish Frederickson M.D.   On: 02/03/2023 02:57   VAS [place] LOWER EXTREMITY VENOUS (DVT) (7a-7p)  Result Date: 02/02/2023  Lower Venous DVT Study Patient Name:  Sherri Mcmillan  Date of Exam:   02/02/2023 Medical Rec #: [id number]        Accession #:    [id number] Date of Birth: 07/03/1932        Patient Gender: F Patient Age:   88 years Exam Location:  [facility] Procedure:       VAS [place] LOWER EXTREMITY VENOUS (DVT) Referring Phys: [name] --------------------------------------------------------------------------------  Indications: Edema, and Swelling.  Risk Factors: Recent extended travel. Performing Technologist: Shona Simpson  Examination Guidelines: A complete evaluation includes B-mode imaging, spectral Doppler, color Doppler, and power Doppler as needed of all accessible portions of each vessel. Bilateral testing is considered an integral part of a complete examination. Limited examinations for reoccurring indications may be performed as noted. The reflux portion of the exam is performed with the patient in reverse Trendelenburg.  +-----+---------------+---------+-----------+----------+--------------+ RIGHTCompressibilityPhasicitySpontaneityPropertiesThrombus Aging +-----+---------------+---------+-----------+----------+--------------+ CFV  Full           Yes      Yes                                 +-----+---------------+---------+-----------+----------+--------------+   +---------+---------------+---------+-----------+----------+--------------+ LEFT     CompressibilityPhasicitySpontaneityPropertiesThrombus Aging +---------+---------------+---------+-----------+----------+--------------+ CFV      Full           Yes      Yes                                 +---------+---------------+---------+-----------+----------+--------------+ SFJ      Full                                                        +---------+---------------+---------+-----------+----------+--------------+ FV Prox  Full                                                        +---------+---------------+---------+-----------+----------+--------------+

## 2023-02-04 ENCOUNTER — Inpatient Hospital Stay (HOSPITAL_COMMUNITY): Payer: 59

## 2023-02-04 DIAGNOSIS — E876 Hypokalemia: Secondary | ICD-10-CM

## 2023-02-04 DIAGNOSIS — R600 Localized edema: Secondary | ICD-10-CM | POA: Diagnosis not present

## 2023-02-04 DIAGNOSIS — I10 Essential (primary) hypertension: Secondary | ICD-10-CM | POA: Diagnosis not present

## 2023-02-04 DIAGNOSIS — R079 Chest pain, unspecified: Secondary | ICD-10-CM

## 2023-02-04 DIAGNOSIS — I509 Heart failure, unspecified: Secondary | ICD-10-CM

## 2023-02-04 DIAGNOSIS — R0789 Other chest pain: Secondary | ICD-10-CM

## 2023-02-04 LAB — ECHOCARDIOGRAM COMPLETE
AR max vel: 2.63 cm2
AV Area VTI: 2.57 cm2
AV Area mean vel: 2.54 cm2
AV Mean grad: 7 mm[Hg]
AV Peak grad: 15.1 mm[Hg]
Ao pk vel: 1.94 m/s
Area-P 1/2: 2.83 cm2
Calc EF: 75 %
Height: 48 in
S' Lateral: 2.5 cm
Single Plane A2C EF: 72.1 %
Single Plane A4C EF: 79.6 %
Weight: 1833.6 [oz_av]

## 2023-02-04 LAB — BASIC METABOLIC PANEL
Anion gap: 11 (ref 5–15)
BUN: 19 mg/dL (ref 8–23)
CO2: 23 mmol/L (ref 22–32)
Calcium: 8.9 mg/dL (ref 8.9–10.3)
Chloride: 103 mmol/L (ref 98–111)
Creatinine, Ser: 1.12 mg/dL — ABNORMAL HIGH (ref 0.44–1.00)
GFR, Estimated: 47 mL/min — ABNORMAL LOW (ref >60)
Glucose, Bld: 88 mg/dL (ref 70–99)
Potassium: 3.2 mmol/L — ABNORMAL LOW (ref 3.5–5.1)
Sodium: 137 mmol/L (ref 135–145)

## 2023-02-04 LAB — CBC
HCT: 32.6 % — ABNORMAL LOW (ref 36.0–46.0)
Hemoglobin: 10.1 g/dL — ABNORMAL LOW (ref 12.0–15.0)
MCH: 27.4 pg (ref 26.0–34.0)
MCHC: 31 g/dL (ref 30.0–36.0)
MCV: 88.3 fL (ref 80.0–100.0)
Platelets: 118 10*3/uL — ABNORMAL LOW (ref 150–400)
RBC: 3.69 MIL/uL — ABNORMAL LOW (ref 3.87–5.11)
RDW: 13.9 % (ref 11.5–15.5)
WBC: 3.4 10*3/uL — ABNORMAL LOW (ref 4.0–10.5)
nRBC: 0 % (ref 0.0–0.2)

## 2023-02-04 LAB — GLUCOSE, CAPILLARY: Glucose-Capillary: 111 mg/dL — ABNORMAL HIGH (ref 70–99)

## 2023-02-04 MED ORDER — ALUM & MAG HYDROXIDE-SIMETH 200-200-20 MG/5ML PO SUSP
15.0000 mL | Freq: Four times a day (QID) | ORAL | Status: DC | PRN
  Administered 2023-02-04 – 2023-02-05 (×2): 15 mL via ORAL
  Filled 2023-02-04 (×2): qty 30

## 2023-02-04 MED ORDER — POTASSIUM CHLORIDE CRYS ER 20 MEQ PO TBCR
40.0000 meq | EXTENDED_RELEASE_TABLET | Freq: Once | ORAL | Status: AC
  Administered 2023-02-04: 40 meq via ORAL
  Filled 2023-02-04: qty 2

## 2023-02-04 MED ORDER — METOPROLOL SUCCINATE ER 50 MG PO TB24
25.0000 mg | ORAL_TABLET | Freq: Every day | ORAL | Status: DC
  Administered 2023-02-04 – 2023-02-11 (×6): 25 mg via ORAL
  Filled 2023-02-04 (×7): qty 1

## 2023-02-04 MED ORDER — HYDROCODONE-ACETAMINOPHEN 5-325 MG PO TABS
1.0000 | ORAL_TABLET | Freq: Four times a day (QID) | ORAL | Status: DC | PRN
  Administered 2023-02-04 – 2023-02-07 (×7): 1 via ORAL
  Filled 2023-02-04 (×7): qty 1

## 2023-02-04 NOTE — Progress Notes (Addendum)
[facility] PROGRESS NOTE   Sherri Mcmillan NFA:[id number] DOB: 1933/03/28 DOA: 02/02/2023  PCP: Cain Saupe, MD  Brief History: 89 y.o. female with medical history significant for chronic anxiety, depression, hypertension, sinus bradycardia, CAD, DVT, paroxysmal atrial fibrillation not on Eliquis, status post TAVR 2020 being admitted to the hospital with hypertensive urgency and concern for new onset heart failure.    Consultants: Cardiology  Procedures: Echocardiogram is pending    Subjective/Interval History: Patient mentions that she feels better this morning.  Not short of breath like yesterday.  Denies any nausea vomiting.  Leg swelling has improved.    Assessment/Plan:  Accelerated hypertension Came in with significantly elevated blood pressure at admission.  She was seen by cardiology.  She was given IV furosemide. Improvement in blood pressure noted. She was on amlodipine prior to admission which has been discontinued to pedal edema. She is noted to be on metoprolol which can be resumed.  Concern for volume overload She had lower extremity edema and was given furosemide.  She was seen by cardiology.  She is diuresed well.  Edema is improved.  Lower extremity Doppler studies were negative for DVT. Echocardiogram is pending.  TSH is normal.  Paroxysmal atrial fibrillation Seen by cardiology and started on apixaban.  On metoprolol.  Stable.  History of glaucoma Continue with eyedrops.  Hypokalemia Will be supplemented.  Mildly elevated creatinine noted at 1.12.  Will recheck labs tomorrow.  Pancytopenia Stable.  Check anemia panel.  Obesity Estimated body mass index is 34.97 kg/m as calculated from the following:   Height as of this encounter: 4' (1.219 m).   Weight as of this encounter: 52 kg.   DVT Prophylaxis: On apixaban Code Status: Full code Family Communication: Discussed with patient Disposition Plan: Mobilize.  Anticipate discharge home  in 24 hours.  Status is: Inpatient Remains inpatient appropriate because: Accelerated hypertension, volume overload      Medications: Scheduled:  apixaban  2.5 mg Oral BID   brimonidine  1 drop Left Eye BID   dorzolamide-timolol  1 drop Left Eye BID   gabapentin  100 mg Oral TID   latanoprost  1 drop Both Eyes QHS   QUEtiapine  25 mg Oral QHS   Continuous: ION:GEXBMWUXLKGMW **OR** acetaminophen, albuterol, hydrALAZINE, hydrOXYzine, ondansetron **OR** ondansetron (ZOFRAN) IV, traZODone  Antibiotics: Anti-infectives (From admission, onward)    None       Objective:  Vital Signs  Vitals:   02/03/23 1700 02/03/23 1745 02/03/23 1825 02/03/23 2306  BP: (!) 146/82 137/66 (!) 154/88 120/69  Pulse:  75 69 84  Resp: 11 15 16 16   Temp:   98.1 F (36.7 C) 98.1 F (36.7 C)  TempSrc:   Oral Oral  SpO2:  99% 99% 100%  Weight:      Height:       No intake or output data in the 24 hours ending 02/04/23 1112 Filed Weights   02/02/23 1815  Weight: 52 kg    General appearance: Awake alert.  In no distress Resp: Clear to auscultation bilaterally.  Normal effort Cardio: S1-S2 is normal regular.  No S3-S4.  No rubs murmurs or bruit GI: Abdomen is soft.  Nontender nondistended.  Bowel sounds are present normal.  No masses organomegaly Extremities: Improved edema bilateral lower extremities Neurologic: Alert and oriented x3.  No focal neurological deficits.    Lab Results:  Data Reviewed: I have personally reviewed following labs and reports of the imaging studies  CBC: Recent Labs  Lab 02/02/23 1923 02/04/23 0519  WBC 3.4* 3.4*  NEUTROABS 1.7  --   HGB 10.5* 10.1*  HCT 33.9* 32.6*  MCV 88.5 88.3  PLT 130* 118*    Basic Metabolic Panel: Recent Labs  Lab 02/02/23 1923 02/04/23 0519  NA 137 137  K 3.7 3.2*  CL 104 103  CO2 25 23  GLUCOSE 90 88  BUN 18 19  CREATININE 0.83 1.12*  CALCIUM 8.9 8.9    GFR: Estimated Creatinine Clearance: 17.3 mL/min (A) (by  C-G formula based on SCr of 1.12 mg/dL (H)).  Liver Function Tests: Recent Labs  Lab 02/02/23 1923  AST 22  ALT 13  ALKPHOS 67  BILITOT 0.8  PROT 7.8  ALBUMIN 4.4     Cardiac Enzymes: Recent Labs  Lab 02/03/23 0614  CKTOTAL 106     CBG: Recent Labs  Lab 02/03/23 1101  GLUCAP 85     Thyroid Function Tests: Recent Labs    02/03/23 1155  TSH 1.322    Radiology Studies: DG Chest 2 View  Result Date: 02/03/2023 CLINICAL DATA:  chest pain EXAM: CHEST - 2 VIEW COMPARISON:  Chest x-ray 06/13/2022, CT angio chest 05/01/2018 FINDINGS: The heart and mediastinal contours are unchanged. Aortic calcification. Aortic valve replacement. Flattening of the hemidiaphragms with nonspecific blunting of the costophrenic angles. No focal consolidation. No pulmonary edema. No pleural effusion. No pneumothorax. No acute osseous abnormality. IMPRESSION: 1. No active cardiopulmonary disease. 2.  Aortic Atherosclerosis (ICD10-I70.0). Electronically Signed   By: Tish Frederickson M.D.   On: 02/03/2023 02:57   VAS [place] LOWER EXTREMITY VENOUS (DVT) (7a-7p)  Result Date: 02/02/2023  Lower Venous DVT Study Patient Name:  Sherri Mcmillan  Date of Exam:   02/02/2023 Medical Rec #: [id number]        Accession #:    [id number] Date of Birth: 12/31/1932        Patient Gender: F Patient Age:   89 years Exam Location:  [facility] Procedure:      VAS [place] LOWER EXTREMITY VENOUS (DVT) Referring Phys: [name] --------------------------------------------------------------------------------  Indications: Edema, and Swelling.  Risk Factors: Recent extended travel. Performing Technologist: Shona Simpson  Examination Guidelines: A complete evaluation includes B-mode imaging, spectral Doppler, color Doppler, and power Doppler as needed of all accessible portions of each vessel. Bilateral testing is considered an integral part of a complete examination. Limited examinations for reoccurring indications may be  performed as noted. The reflux portion of the exam is performed with the patient in reverse Trendelenburg.  +-----+---------------+---------+-----------+----------+--------------+ RIGHTCompressibilityPhasicitySpontaneityPropertiesThrombus Aging +-----+---------------+---------+-----------+----------+--------------+ CFV  Full           Yes      Yes                                 +-----+---------------+---------+-----------+----------+--------------+   +---------+---------------+---------+-----------+----------+--------------+ LEFT     CompressibilityPhasicitySpontaneityPropertiesThrombus Aging +---------+---------------+---------+-----------+----------+--------------+ CFV      Full           Yes      Yes                                 +---------+---------------+---------+-----------+----------+--------------+ SFJ      Full                                                        +---------+---------------+---------+-----------+----------+--------------+  [facility] PROGRESS NOTE   Sherri Mcmillan NFA:[id number] DOB: 1933/03/28 DOA: 02/02/2023  PCP: Cain Saupe, MD  Brief History: 89 y.o. female with medical history significant for chronic anxiety, depression, hypertension, sinus bradycardia, CAD, DVT, paroxysmal atrial fibrillation not on Eliquis, status post TAVR 2020 being admitted to the hospital with hypertensive urgency and concern for new onset heart failure.    Consultants: Cardiology  Procedures: Echocardiogram is pending    Subjective/Interval History: Patient mentions that she feels better this morning.  Not short of breath like yesterday.  Denies any nausea vomiting.  Leg swelling has improved.    Assessment/Plan:  Accelerated hypertension Came in with significantly elevated blood pressure at admission.  She was seen by cardiology.  She was given IV furosemide. Improvement in blood pressure noted. She was on amlodipine prior to admission which has been discontinued to pedal edema. She is noted to be on metoprolol which can be resumed.  Concern for volume overload She had lower extremity edema and was given furosemide.  She was seen by cardiology.  She is diuresed well.  Edema is improved.  Lower extremity Doppler studies were negative for DVT. Echocardiogram is pending.  TSH is normal.  Paroxysmal atrial fibrillation Seen by cardiology and started on apixaban.  On metoprolol.  Stable.  History of glaucoma Continue with eyedrops.  Hypokalemia Will be supplemented.  Mildly elevated creatinine noted at 1.12.  Will recheck labs tomorrow.  Pancytopenia Stable.  Check anemia panel.  Obesity Estimated body mass index is 34.97 kg/m as calculated from the following:   Height as of this encounter: 4' (1.219 m).   Weight as of this encounter: 52 kg.   DVT Prophylaxis: On apixaban Code Status: Full code Family Communication: Discussed with patient Disposition Plan: Mobilize.  Anticipate discharge home  in 24 hours.  Status is: Inpatient Remains inpatient appropriate because: Accelerated hypertension, volume overload      Medications: Scheduled:  apixaban  2.5 mg Oral BID   brimonidine  1 drop Left Eye BID   dorzolamide-timolol  1 drop Left Eye BID   gabapentin  100 mg Oral TID   latanoprost  1 drop Both Eyes QHS   QUEtiapine  25 mg Oral QHS   Continuous: ION:GEXBMWUXLKGMW **OR** acetaminophen, albuterol, hydrALAZINE, hydrOXYzine, ondansetron **OR** ondansetron (ZOFRAN) IV, traZODone  Antibiotics: Anti-infectives (From admission, onward)    None       Objective:  Vital Signs  Vitals:   02/03/23 1700 02/03/23 1745 02/03/23 1825 02/03/23 2306  BP: (!) 146/82 137/66 (!) 154/88 120/69  Pulse:  75 69 84  Resp: 11 15 16 16   Temp:   98.1 F (36.7 C) 98.1 F (36.7 C)  TempSrc:   Oral Oral  SpO2:  99% 99% 100%  Weight:      Height:       No intake or output data in the 24 hours ending 02/04/23 1112 Filed Weights   02/02/23 1815  Weight: 52 kg    General appearance: Awake alert.  In no distress Resp: Clear to auscultation bilaterally.  Normal effort Cardio: S1-S2 is normal regular.  No S3-S4.  No rubs murmurs or bruit GI: Abdomen is soft.  Nontender nondistended.  Bowel sounds are present normal.  No masses organomegaly Extremities: Improved edema bilateral lower extremities Neurologic: Alert and oriented x3.  No focal neurological deficits.    Lab Results:  Data Reviewed: I have personally reviewed following labs and reports of the imaging studies  CBC: Recent Labs

## 2023-02-04 NOTE — Plan of Care (Signed)
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
## 2023-02-04 NOTE — Progress Notes (Signed)
Mobility Specialist - Progress Note  Pre-mobility: 84 bpm HR,  During mobility: 87 bpm HR,  Post-mobility: 76 bpm HR,    02/04/23 1039  Mobility  Activity Ambulated with assistance in hallway  Level of Assistance Contact guard assist, steadying assist  Assistive Device Front wheel walker  Distance Ambulated (ft) 150 ft  Range of Motion/Exercises Active  Activity Response Tolerated fair  Mobility Referral Yes  $Mobility charge 1 Mobility   Pt was found in bed and agreeable to ambulate. Pt was supervision for bed mobility. Once standing had x1 failed STS and able to stand on second attempt. Pt was unsteady ambulating first couple of steps and reached out for items to steady herself. Recommended to pt the use of RW to facilitate ambulation. During session pt c/o heart racing, checked HR and maintained in 80s throughout session. At EOS returned to recliner chair with all needs met. Call bell in reach and RN notified.  Billey Chang Mobility Specialist

## 2023-02-04 NOTE — Progress Notes (Signed)
[id number]      Weight:       117.1 lb Date of Birth:  08-22-1932       BSA:          1.49 m Patient Age:    89 years        BP:           132/79 mmHg Patient Gender: F               HR:           71 bpm. Exam Location:  [address]   Procedure: 2D Echo, Cardiac Doppler and Color Doppler  Indications:    Z95.2 s/p TAVR  History:        Patient has prior history of Echocardiogram examinations, most recent 05/31/2018. DVT; Risk Factors: HLD and Hypertension.  Sonographer:    Clearence Ped RCS Referring Phys: [id number] KATHRYN R THOMPSON  IMPRESSIONS   1. The left ventricle has normal systolic function with an ejection fraction of 60-65%. The cavity size was normal. Left ventricular diastolic Doppler parameters are consistent with pseudonormalization No evidence of left ventricular regional wall motion abnormalities. 2. The right ventricle has normal  systolic function. The cavity was normal. There is no increase in right ventricular wall thickness. 3. The mitral valve is normal in structure. Mild thickening of the mitral valve leaflet. 4. The tricuspid valve is normal in structure. 5. Post TAVR 26 Medtronic Core Valve no PVL stable gradients. 6. The pulmonic valve was normal in structure. 7. No evidence of left ventricular regional wall motion abnormalities.  FINDINGS Left Ventricle: The left ventricle has normal systolic function, with an ejection fraction of 60-65%. The cavity size was normal. There is no increase in left ventricular wall thickness. Left ventricular diastolic Doppler parameters are consistent with pseudonormalization No evidence of left ventricular regional wall motion abnormalities.. Right Ventricle: The right ventricle has normal systolic function. The cavity was normal. There is no increase in right ventricular wall thickness. Left Atrium: left atrial size was normal in size Right Atrium: right atrial size was normal in size. Right atrial pressure is estimated at 3 mmHg. Interatrial Septum: No atrial level shunt detected by color flow Doppler. Pericardium: There is no evidence of pericardial effusion. Mitral Valve: The mitral valve is normal in structure. Mild thickening of the mitral valve leaflet. Mitral valve regurgitation is not visualized by color flow Doppler. Tricuspid Valve: The tricuspid valve is normal in structure. Tricuspid valve regurgitation was not visualized by color flow Doppler. Aortic Valve: The aortic valve has been repaired/replaced Aortic valve regurgitation was not visualized by color flow Doppler. Post TAVR 26 Medtronic Core Valve no PVL stable gradients. Pulmonic Valve: The pulmonic valve was normal in structure. Pulmonic valve regurgitation is not visualized by color flow Doppler. Venous: The inferior vena cava is normal in size with greater than 50% respiratory variability.  LEFT  VENTRICLE PLAX 2D (Teich) LV EF:          68.9 %   Diastology LVIDd:          3.50 cm  LV e' lateral:   6.85 cm/s LVIDs:          2.18 cm  LV E/e' lateral: 15.0 LV PW:          1.33 cm  LV e' medial:    5.87 cm/s LV IVS:         1.04 cm  LV E/e' medial:  17.5 LVOT diam:  or appendage.  Impressions:  - This was a periprocedural TEE during a TAVR procedure.  Native aortic valve peak/mean transaortic gradients were 76/45 mmHg, mild aortic regurgitation. A 26 mm Medtronic Evolut Pro valve was successfully deployed in the aortic position.  Post deployment gradients were peak/mean 13/7 mmHg. There was only trivial paravalvular leak. Mild mitral regurgitation hasn&'t changed post deployment. No pericardial effusion prior or post procedure.  ------------------------------------------------------------------- Study data:   Procedure:  Initial setup. The patient was brought to the laboratory. Surface ECG leads were monitored. Sedation. Conscious sedation was administered. Transesophageal echocardiography. Topical anesthesia was obtained using viscous lidocaine. A transesophageal probe was inserted by the attending cardiologist. Image quality was adequate.  Study completion:  The patient  tolerated the procedure well. There were no complications. Diagnostic transesophageal echocardiography.  2D and color Doppler.  Birthdate:  Patient birthdate: 30-Mar-1933.  Age:  Patient is 89 yr old.  Sex:  Gender: female.    BMI: 23.1 kg/m^2.  Study date:  Study date: 05/30/2018. Study time: 07:11 AM.  -------------------------------------------------------------------  ------------------------------------------------------------------- Left ventricle:  There was mild concentric hypertrophy. Systolic function was normal. The estimated ejection fraction was in the range of 60% to 65%. Wall motion was normal; there were no regional wall motion abnormalities.  ------------------------------------------------------------------- Aortic valve:   Trileaflet; severely thickened, severely calcified leaflets. Cusp separation was normal. Valve mobility was restricted.  Doppler:   There was severe stenosis.   There was mild regurgitation.    VTI ratio of LVOT to aortic valve: 0.49. Valve area (VTI): 1.4 cm^2. Indexed valve area (VTI): 0.92 cm^2/m^2. Peak velocity ratio of LVOT to aortic valve: 0.52. Valve area (Vmax): 1.48 cm^2. Indexed valve area (Vmax): 0.98 cm^2/m^2. Mean velocity ratio of LVOT to aortic valve: 0.51. Valve area (Vmean): 1.46 cm^2. Indexed valve area (Vmean): 0.96 cm^2/m^2.    Mean gradient (S): 7 mm Hg. Peak gradient (S): 13 mm Hg.  ------------------------------------------------------------------- Aorta:  There was no atheroma. There was no evidence for dissection. Aortic root: The aortic root was not dilated. Ascending aorta: The ascending aorta was normal in size. Aortic arch: The aortic arch was normal in size. Descending aorta: The descending aorta was normal in size.  ------------------------------------------------------------------- Mitral valve:   Structurally normal valve.   Leaflet separation was normal.  Doppler:  There was mild  regurgitation.  ------------------------------------------------------------------- Left atrium:  The atrium was normal in size.  No evidence of thrombus in the atrial cavity or appendage.  No evidence of thrombus in the atrial cavity or appendage. The appendage was morphologically a left appendage, multilobulated, and of normal size. Emptying velocity was normal.  ------------------------------------------------------------------- Right ventricle:  The cavity size was normal. Wall thickness was normal. Systolic function was normal.  ------------------------------------------------------------------- Pulmonic valve:    Structurally normal valve.  ------------------------------------------------------------------- Tricuspid valve:   Structurally normal valve.   Leaflet separation was normal.  Doppler:  There was mild regurgitation.  ------------------------------------------------------------------- Pulmonary artery:   The main pulmonary artery was normal-sized.  ------------------------------------------------------------------- Right atrium:  The atrium was normal in size.  No evidence of thrombus in the atrial cavity or appendage. The appendage was morphologically a right appendage.  ------------------------------------------------------------------- Pericardium:  There was no pericardial effusion.  ------------------------------------------------------------------- Measurements  Left ventricle                           Value Stroke volume, 2D  [id number]      Weight:       117.1 lb Date of Birth:  08-22-1932       BSA:          1.49 m Patient Age:    89 years        BP:           132/79 mmHg Patient Gender: F               HR:           71 bpm. Exam Location:  [address]   Procedure: 2D Echo, Cardiac Doppler and Color Doppler  Indications:    Z95.2 s/p TAVR  History:        Patient has prior history of Echocardiogram examinations, most recent 05/31/2018. DVT; Risk Factors: HLD and Hypertension.  Sonographer:    Clearence Ped RCS Referring Phys: [id number] KATHRYN R THOMPSON  IMPRESSIONS   1. The left ventricle has normal systolic function with an ejection fraction of 60-65%. The cavity size was normal. Left ventricular diastolic Doppler parameters are consistent with pseudonormalization No evidence of left ventricular regional wall motion abnormalities. 2. The right ventricle has normal  systolic function. The cavity was normal. There is no increase in right ventricular wall thickness. 3. The mitral valve is normal in structure. Mild thickening of the mitral valve leaflet. 4. The tricuspid valve is normal in structure. 5. Post TAVR 26 Medtronic Core Valve no PVL stable gradients. 6. The pulmonic valve was normal in structure. 7. No evidence of left ventricular regional wall motion abnormalities.  FINDINGS Left Ventricle: The left ventricle has normal systolic function, with an ejection fraction of 60-65%. The cavity size was normal. There is no increase in left ventricular wall thickness. Left ventricular diastolic Doppler parameters are consistent with pseudonormalization No evidence of left ventricular regional wall motion abnormalities.. Right Ventricle: The right ventricle has normal systolic function. The cavity was normal. There is no increase in right ventricular wall thickness. Left Atrium: left atrial size was normal in size Right Atrium: right atrial size was normal in size. Right atrial pressure is estimated at 3 mmHg. Interatrial Septum: No atrial level shunt detected by color flow Doppler. Pericardium: There is no evidence of pericardial effusion. Mitral Valve: The mitral valve is normal in structure. Mild thickening of the mitral valve leaflet. Mitral valve regurgitation is not visualized by color flow Doppler. Tricuspid Valve: The tricuspid valve is normal in structure. Tricuspid valve regurgitation was not visualized by color flow Doppler. Aortic Valve: The aortic valve has been repaired/replaced Aortic valve regurgitation was not visualized by color flow Doppler. Post TAVR 26 Medtronic Core Valve no PVL stable gradients. Pulmonic Valve: The pulmonic valve was normal in structure. Pulmonic valve regurgitation is not visualized by color flow Doppler. Venous: The inferior vena cava is normal in size with greater than 50% respiratory variability.  LEFT  VENTRICLE PLAX 2D (Teich) LV EF:          68.9 %   Diastology LVIDd:          3.50 cm  LV e' lateral:   6.85 cm/s LVIDs:          2.18 cm  LV E/e' lateral: 15.0 LV PW:          1.33 cm  LV e' medial:    5.87 cm/s LV IVS:         1.04 cm  LV E/e' medial:  17.5 LVOT diam:  or appendage.  Impressions:  - This was a periprocedural TEE during a TAVR procedure.  Native aortic valve peak/mean transaortic gradients were 76/45 mmHg, mild aortic regurgitation. A 26 mm Medtronic Evolut Pro valve was successfully deployed in the aortic position.  Post deployment gradients were peak/mean 13/7 mmHg. There was only trivial paravalvular leak. Mild mitral regurgitation hasn&'t changed post deployment. No pericardial effusion prior or post procedure.  ------------------------------------------------------------------- Study data:   Procedure:  Initial setup. The patient was brought to the laboratory. Surface ECG leads were monitored. Sedation. Conscious sedation was administered. Transesophageal echocardiography. Topical anesthesia was obtained using viscous lidocaine. A transesophageal probe was inserted by the attending cardiologist. Image quality was adequate.  Study completion:  The patient  tolerated the procedure well. There were no complications. Diagnostic transesophageal echocardiography.  2D and color Doppler.  Birthdate:  Patient birthdate: 30-Mar-1933.  Age:  Patient is 89 yr old.  Sex:  Gender: female.    BMI: 23.1 kg/m^2.  Study date:  Study date: 05/30/2018. Study time: 07:11 AM.  -------------------------------------------------------------------  ------------------------------------------------------------------- Left ventricle:  There was mild concentric hypertrophy. Systolic function was normal. The estimated ejection fraction was in the range of 60% to 65%. Wall motion was normal; there were no regional wall motion abnormalities.  ------------------------------------------------------------------- Aortic valve:   Trileaflet; severely thickened, severely calcified leaflets. Cusp separation was normal. Valve mobility was restricted.  Doppler:   There was severe stenosis.   There was mild regurgitation.    VTI ratio of LVOT to aortic valve: 0.49. Valve area (VTI): 1.4 cm^2. Indexed valve area (VTI): 0.92 cm^2/m^2. Peak velocity ratio of LVOT to aortic valve: 0.52. Valve area (Vmax): 1.48 cm^2. Indexed valve area (Vmax): 0.98 cm^2/m^2. Mean velocity ratio of LVOT to aortic valve: 0.51. Valve area (Vmean): 1.46 cm^2. Indexed valve area (Vmean): 0.96 cm^2/m^2.    Mean gradient (S): 7 mm Hg. Peak gradient (S): 13 mm Hg.  ------------------------------------------------------------------- Aorta:  There was no atheroma. There was no evidence for dissection. Aortic root: The aortic root was not dilated. Ascending aorta: The ascending aorta was normal in size. Aortic arch: The aortic arch was normal in size. Descending aorta: The descending aorta was normal in size.  ------------------------------------------------------------------- Mitral valve:   Structurally normal valve.   Leaflet separation was normal.  Doppler:  There was mild  regurgitation.  ------------------------------------------------------------------- Left atrium:  The atrium was normal in size.  No evidence of thrombus in the atrial cavity or appendage.  No evidence of thrombus in the atrial cavity or appendage. The appendage was morphologically a left appendage, multilobulated, and of normal size. Emptying velocity was normal.  ------------------------------------------------------------------- Right ventricle:  The cavity size was normal. Wall thickness was normal. Systolic function was normal.  ------------------------------------------------------------------- Pulmonic valve:    Structurally normal valve.  ------------------------------------------------------------------- Tricuspid valve:   Structurally normal valve.   Leaflet separation was normal.  Doppler:  There was mild regurgitation.  ------------------------------------------------------------------- Pulmonary artery:   The main pulmonary artery was normal-sized.  ------------------------------------------------------------------- Right atrium:  The atrium was normal in size.  No evidence of thrombus in the atrial cavity or appendage. The appendage was morphologically a right appendage.  ------------------------------------------------------------------- Pericardium:  There was no pericardial effusion.  ------------------------------------------------------------------- Measurements  Left ventricle                           Value Stroke volume, 2D  [id number]      Weight:       117.1 lb Date of Birth:  08-22-1932       BSA:          1.49 m Patient Age:    89 years        BP:           132/79 mmHg Patient Gender: F               HR:           71 bpm. Exam Location:  [address]   Procedure: 2D Echo, Cardiac Doppler and Color Doppler  Indications:    Z95.2 s/p TAVR  History:        Patient has prior history of Echocardiogram examinations, most recent 05/31/2018. DVT; Risk Factors: HLD and Hypertension.  Sonographer:    Clearence Ped RCS Referring Phys: [id number] KATHRYN R THOMPSON  IMPRESSIONS   1. The left ventricle has normal systolic function with an ejection fraction of 60-65%. The cavity size was normal. Left ventricular diastolic Doppler parameters are consistent with pseudonormalization No evidence of left ventricular regional wall motion abnormalities. 2. The right ventricle has normal  systolic function. The cavity was normal. There is no increase in right ventricular wall thickness. 3. The mitral valve is normal in structure. Mild thickening of the mitral valve leaflet. 4. The tricuspid valve is normal in structure. 5. Post TAVR 26 Medtronic Core Valve no PVL stable gradients. 6. The pulmonic valve was normal in structure. 7. No evidence of left ventricular regional wall motion abnormalities.  FINDINGS Left Ventricle: The left ventricle has normal systolic function, with an ejection fraction of 60-65%. The cavity size was normal. There is no increase in left ventricular wall thickness. Left ventricular diastolic Doppler parameters are consistent with pseudonormalization No evidence of left ventricular regional wall motion abnormalities.. Right Ventricle: The right ventricle has normal systolic function. The cavity was normal. There is no increase in right ventricular wall thickness. Left Atrium: left atrial size was normal in size Right Atrium: right atrial size was normal in size. Right atrial pressure is estimated at 3 mmHg. Interatrial Septum: No atrial level shunt detected by color flow Doppler. Pericardium: There is no evidence of pericardial effusion. Mitral Valve: The mitral valve is normal in structure. Mild thickening of the mitral valve leaflet. Mitral valve regurgitation is not visualized by color flow Doppler. Tricuspid Valve: The tricuspid valve is normal in structure. Tricuspid valve regurgitation was not visualized by color flow Doppler. Aortic Valve: The aortic valve has been repaired/replaced Aortic valve regurgitation was not visualized by color flow Doppler. Post TAVR 26 Medtronic Core Valve no PVL stable gradients. Pulmonic Valve: The pulmonic valve was normal in structure. Pulmonic valve regurgitation is not visualized by color flow Doppler. Venous: The inferior vena cava is normal in size with greater than 50% respiratory variability.  LEFT  VENTRICLE PLAX 2D (Teich) LV EF:          68.9 %   Diastology LVIDd:          3.50 cm  LV e' lateral:   6.85 cm/s LVIDs:          2.18 cm  LV E/e' lateral: 15.0 LV PW:          1.33 cm  LV e' medial:    5.87 cm/s LV IVS:         1.04 cm  LV E/e' medial:  17.5 LVOT diam:  or appendage.  Impressions:  - This was a periprocedural TEE during a TAVR procedure.  Native aortic valve peak/mean transaortic gradients were 76/45 mmHg, mild aortic regurgitation. A 26 mm Medtronic Evolut Pro valve was successfully deployed in the aortic position.  Post deployment gradients were peak/mean 13/7 mmHg. There was only trivial paravalvular leak. Mild mitral regurgitation hasn&'t changed post deployment. No pericardial effusion prior or post procedure.  ------------------------------------------------------------------- Study data:   Procedure:  Initial setup. The patient was brought to the laboratory. Surface ECG leads were monitored. Sedation. Conscious sedation was administered. Transesophageal echocardiography. Topical anesthesia was obtained using viscous lidocaine. A transesophageal probe was inserted by the attending cardiologist. Image quality was adequate.  Study completion:  The patient  tolerated the procedure well. There were no complications. Diagnostic transesophageal echocardiography.  2D and color Doppler.  Birthdate:  Patient birthdate: 30-Mar-1933.  Age:  Patient is 89 yr old.  Sex:  Gender: female.    BMI: 23.1 kg/m^2.  Study date:  Study date: 05/30/2018. Study time: 07:11 AM.  -------------------------------------------------------------------  ------------------------------------------------------------------- Left ventricle:  There was mild concentric hypertrophy. Systolic function was normal. The estimated ejection fraction was in the range of 60% to 65%. Wall motion was normal; there were no regional wall motion abnormalities.  ------------------------------------------------------------------- Aortic valve:   Trileaflet; severely thickened, severely calcified leaflets. Cusp separation was normal. Valve mobility was restricted.  Doppler:   There was severe stenosis.   There was mild regurgitation.    VTI ratio of LVOT to aortic valve: 0.49. Valve area (VTI): 1.4 cm^2. Indexed valve area (VTI): 0.92 cm^2/m^2. Peak velocity ratio of LVOT to aortic valve: 0.52. Valve area (Vmax): 1.48 cm^2. Indexed valve area (Vmax): 0.98 cm^2/m^2. Mean velocity ratio of LVOT to aortic valve: 0.51. Valve area (Vmean): 1.46 cm^2. Indexed valve area (Vmean): 0.96 cm^2/m^2.    Mean gradient (S): 7 mm Hg. Peak gradient (S): 13 mm Hg.  ------------------------------------------------------------------- Aorta:  There was no atheroma. There was no evidence for dissection. Aortic root: The aortic root was not dilated. Ascending aorta: The ascending aorta was normal in size. Aortic arch: The aortic arch was normal in size. Descending aorta: The descending aorta was normal in size.  ------------------------------------------------------------------- Mitral valve:   Structurally normal valve.   Leaflet separation was normal.  Doppler:  There was mild  regurgitation.  ------------------------------------------------------------------- Left atrium:  The atrium was normal in size.  No evidence of thrombus in the atrial cavity or appendage.  No evidence of thrombus in the atrial cavity or appendage. The appendage was morphologically a left appendage, multilobulated, and of normal size. Emptying velocity was normal.  ------------------------------------------------------------------- Right ventricle:  The cavity size was normal. Wall thickness was normal. Systolic function was normal.  ------------------------------------------------------------------- Pulmonic valve:    Structurally normal valve.  ------------------------------------------------------------------- Tricuspid valve:   Structurally normal valve.   Leaflet separation was normal.  Doppler:  There was mild regurgitation.  ------------------------------------------------------------------- Pulmonary artery:   The main pulmonary artery was normal-sized.  ------------------------------------------------------------------- Right atrium:  The atrium was normal in size.  No evidence of thrombus in the atrial cavity or appendage. The appendage was morphologically a right appendage.  ------------------------------------------------------------------- Pericardium:  There was no pericardial effusion.  ------------------------------------------------------------------- Measurements  Left ventricle                           Value Stroke volume, 2D  or appendage.  Impressions:  - This was a periprocedural TEE during a TAVR procedure.  Native aortic valve peak/mean transaortic gradients were 76/45 mmHg, mild aortic regurgitation. A 26 mm Medtronic Evolut Pro valve was successfully deployed in the aortic position.  Post deployment gradients were peak/mean 13/7 mmHg. There was only trivial paravalvular leak. Mild mitral regurgitation hasn&'t changed post deployment. No pericardial effusion prior or post procedure.  ------------------------------------------------------------------- Study data:   Procedure:  Initial setup. The patient was brought to the laboratory. Surface ECG leads were monitored. Sedation. Conscious sedation was administered. Transesophageal echocardiography. Topical anesthesia was obtained using viscous lidocaine. A transesophageal probe was inserted by the attending cardiologist. Image quality was adequate.  Study completion:  The patient  tolerated the procedure well. There were no complications. Diagnostic transesophageal echocardiography.  2D and color Doppler.  Birthdate:  Patient birthdate: 30-Mar-1933.  Age:  Patient is 89 yr old.  Sex:  Gender: female.    BMI: 23.1 kg/m^2.  Study date:  Study date: 05/30/2018. Study time: 07:11 AM.  -------------------------------------------------------------------  ------------------------------------------------------------------- Left ventricle:  There was mild concentric hypertrophy. Systolic function was normal. The estimated ejection fraction was in the range of 60% to 65%. Wall motion was normal; there were no regional wall motion abnormalities.  ------------------------------------------------------------------- Aortic valve:   Trileaflet; severely thickened, severely calcified leaflets. Cusp separation was normal. Valve mobility was restricted.  Doppler:   There was severe stenosis.   There was mild regurgitation.    VTI ratio of LVOT to aortic valve: 0.49. Valve area (VTI): 1.4 cm^2. Indexed valve area (VTI): 0.92 cm^2/m^2. Peak velocity ratio of LVOT to aortic valve: 0.52. Valve area (Vmax): 1.48 cm^2. Indexed valve area (Vmax): 0.98 cm^2/m^2. Mean velocity ratio of LVOT to aortic valve: 0.51. Valve area (Vmean): 1.46 cm^2. Indexed valve area (Vmean): 0.96 cm^2/m^2.    Mean gradient (S): 7 mm Hg. Peak gradient (S): 13 mm Hg.  ------------------------------------------------------------------- Aorta:  There was no atheroma. There was no evidence for dissection. Aortic root: The aortic root was not dilated. Ascending aorta: The ascending aorta was normal in size. Aortic arch: The aortic arch was normal in size. Descending aorta: The descending aorta was normal in size.  ------------------------------------------------------------------- Mitral valve:   Structurally normal valve.   Leaflet separation was normal.  Doppler:  There was mild  regurgitation.  ------------------------------------------------------------------- Left atrium:  The atrium was normal in size.  No evidence of thrombus in the atrial cavity or appendage.  No evidence of thrombus in the atrial cavity or appendage. The appendage was morphologically a left appendage, multilobulated, and of normal size. Emptying velocity was normal.  ------------------------------------------------------------------- Right ventricle:  The cavity size was normal. Wall thickness was normal. Systolic function was normal.  ------------------------------------------------------------------- Pulmonic valve:    Structurally normal valve.  ------------------------------------------------------------------- Tricuspid valve:   Structurally normal valve.   Leaflet separation was normal.  Doppler:  There was mild regurgitation.  ------------------------------------------------------------------- Pulmonary artery:   The main pulmonary artery was normal-sized.  ------------------------------------------------------------------- Right atrium:  The atrium was normal in size.  No evidence of thrombus in the atrial cavity or appendage. The appendage was morphologically a right appendage.  ------------------------------------------------------------------- Pericardium:  There was no pericardial effusion.  ------------------------------------------------------------------- Measurements  Left ventricle                           Value Stroke volume, 2D

## 2023-02-04 NOTE — Progress Notes (Signed)
  Echocardiogram 2D Echocardiogram has been performed.  Janalyn Harder 02/04/2023, 1:45 PM

## 2023-02-05 ENCOUNTER — Inpatient Hospital Stay (HOSPITAL_COMMUNITY): Payer: 59

## 2023-02-05 ENCOUNTER — Encounter (HOSPITAL_COMMUNITY): Payer: Self-pay | Admitting: Internal Medicine

## 2023-02-05 DIAGNOSIS — I10 Essential (primary) hypertension: Secondary | ICD-10-CM | POA: Diagnosis not present

## 2023-02-05 DIAGNOSIS — R1013 Epigastric pain: Secondary | ICD-10-CM | POA: Diagnosis not present

## 2023-02-05 LAB — BASIC METABOLIC PANEL
Anion gap: 8 (ref 5–15)
BUN: 23 mg/dL (ref 8–23)
CO2: 26 mmol/L (ref 22–32)
Calcium: 9.1 mg/dL (ref 8.9–10.3)
Chloride: 104 mmol/L (ref 98–111)
Creatinine, Ser: 0.98 mg/dL (ref 0.44–1.00)
GFR, Estimated: 56 mL/min — ABNORMAL LOW (ref >60)
Glucose, Bld: 87 mg/dL (ref 70–99)
Potassium: 4.2 mmol/L (ref 3.5–5.1)
Sodium: 138 mmol/L (ref 135–145)

## 2023-02-05 LAB — CBC
HCT: 33.2 % — ABNORMAL LOW (ref 36.0–46.0)
Hemoglobin: 10.3 g/dL — ABNORMAL LOW (ref 12.0–15.0)
MCH: 27.6 pg (ref 26.0–34.0)
MCHC: 31 g/dL (ref 30.0–36.0)
MCV: 89 fL (ref 80.0–100.0)
Platelets: 131 10*3/uL — ABNORMAL LOW (ref 150–400)
RBC: 3.73 MIL/uL — ABNORMAL LOW (ref 3.87–5.11)
RDW: 13.9 % (ref 11.5–15.5)
WBC: 3.5 10*3/uL — ABNORMAL LOW (ref 4.0–10.5)
nRBC: 0 % (ref 0.0–0.2)

## 2023-02-05 LAB — HEPATIC FUNCTION PANEL
ALT: 11 U/L (ref 0–44)
AST: 17 U/L (ref 15–41)
Albumin: 3.4 g/dL — ABNORMAL LOW (ref 3.5–5.0)
Alkaline Phosphatase: 43 U/L (ref 38–126)
Bilirubin, Direct: <0.1 mg/dL (ref 0.0–0.2)
Total Bilirubin: 0.6 mg/dL (ref 0.3–1.2)
Total Protein: 6.4 g/dL — ABNORMAL LOW (ref 6.5–8.1)

## 2023-02-05 LAB — LIPASE, BLOOD: Lipase: 67 U/L — ABNORMAL HIGH (ref 11–51)

## 2023-02-05 MED ORDER — SUCRALFATE 1 GM/10ML PO SUSP
1.0000 g | Freq: Three times a day (TID) | ORAL | Status: DC
  Administered 2023-02-05 – 2023-02-09 (×15): 1 g via ORAL
  Filled 2023-02-05 (×16): qty 10

## 2023-02-05 MED ORDER — IOHEXOL 300 MG/ML  SOLN
100.0000 mL | Freq: Once | INTRAMUSCULAR | Status: AC | PRN
  Administered 2023-02-05: 100 mL via INTRAVENOUS

## 2023-02-05 MED ORDER — PANTOPRAZOLE SODIUM 40 MG PO TBEC
40.0000 mg | DELAYED_RELEASE_TABLET | Freq: Every day | ORAL | Status: DC
  Administered 2023-02-05: 40 mg via ORAL
  Filled 2023-02-05: qty 1

## 2023-02-05 NOTE — Progress Notes (Signed)
[facility] PROGRESS NOTE   Sherri Mcmillan UUV:[id number] DOB: [birth year] DOA: 02/02/2023  PCP: Cain Saupe, MD  Brief History: 89 y.o. female with medical history significant for chronic anxiety, depression, hypertension, sinus bradycardia, CAD, DVT, paroxysmal atrial fibrillation not on Eliquis, status post TAVR 2020 being admitted to the hospital with hypertensive urgency and concern for new onset heart failure.    Consultants: Cardiology  Procedures: Echocardiogram     Subjective/Interval History: Patient mentions occasional pain in the upper abdomen and that sometimes radiates into her chest.  Leg pain is about the same with slight improvement.  No other symptoms offered.     Assessment/Plan:  Accelerated hypertension Came in with significantly elevated blood pressure at admission.  She was seen by cardiology.  She was given IV furosemide. Improvement in blood pressure noted. She was on amlodipine prior to admission which has been discontinued to pedal edema. Metoprolol was resumed.  Blood pressure trends noted to be better but noted to be high again this morning.  Will continue to monitor for now.  He is.  May need to consider alternatives such as ACE inhibitor.  Concern for volume overload She had lower extremity edema and was given furosemide.  She was seen by cardiology.  She is diuresed well.  Edema is improved.   Lower extremity Doppler studies were negative for DVT. Echocardiogram showed normal systolic function.  The aortic valve did not show any acute findings.  TSH is normal. No indication to continue furosemide at this time. Etiology of leg pain is not clear.  Has good pulses.  No obvious lesions identified.  Monitor for now.  Upper abdominal pain Tender in the epigastrium.  LFTs are normal.  Lipase mildly elevated.  Could be gastritis.  Will give her PPI and Carafate.  Will proceed with CT abdomen pelvis considering elevated lipase level.  Paroxysmal  atrial fibrillation Seen by cardiology and started on apixaban.  On metoprolol.  Stable.  History of glaucoma Continue with eyedrops.  Hypokalemia Potassium level is normal today.  Creatinine is normal.  Pancytopenia Stable.  Check anemia panel.  Obesity Estimated body mass index is 32.36 kg/m as calculated from the following:   Height as of this encounter: 4' (1.219 m).   Weight as of this encounter: 48.1 kg.   DVT Prophylaxis: On apixaban Code Status: Full code Family Communication: Discussed with patient Disposition Plan: To mobilize.  Hopefully discharge in 24 to 48 hours  Status is: Inpatient Remains inpatient appropriate because: Accelerated hypertension, volume overload      Medications: Scheduled:  apixaban  2.5 mg Oral BID   brimonidine  1 drop Left Eye BID   dorzolamide-timolol  1 drop Left Eye BID   gabapentin  100 mg Oral TID   latanoprost  1 drop Both Eyes QHS   metoprolol succinate  25 mg Oral Daily   pantoprazole  40 mg Oral Q1200   QUEtiapine  25 mg Oral QHS   sucralfate  1 g Oral TID WC & HS   Continuous: KVQ:QVZDGLOVFIEPP **OR** acetaminophen, albuterol, alum & mag hydroxide-simeth, hydrALAZINE, HYDROcodone-acetaminophen, hydrOXYzine, ondansetron **OR** ondansetron (ZOFRAN) IV, traZODone  Antibiotics: Anti-infectives (From admission, onward)    None       Objective:  Vital Signs  Vitals:   02/04/23 2111 02/05/23 0500 02/05/23 0547 02/05/23 0840  BP: 127/78  134/71 (!) 189/90  Pulse: (!) 56  (!) 52 63  Resp: 17  18 16   Temp: 98.5 F (36.9 C)  98.2 F (  36.8 C) 98.6 F (37 C)  TempSrc: Oral  Oral   SpO2: 97%  100% 98%  Weight:  48.1 kg    Height:        Intake/Output Summary (Last 24 hours) at 02/05/2023 1012 Last data filed at 02/04/2023 2300 Gross per 24 hour  Intake 120 ml  Output --  Net 120 ml   Filed Weights   02/02/23 1815 02/05/23 0500  Weight: 52 kg 48.1 kg    General appearance: Awake alert.  In no  distress Resp: Clear to auscultation bilaterally.  Normal effort Cardio: S1-S2 is normal regular.  No S3-S4.  No rubs murmurs or bruit GI: Abdomen is soft.  In the epigastrium.  No rebound rigidity or guarding.  No obvious masses organomegaly. Extremities: No edema.  Full range of motion of lower extremities. Neurologic: Alert and oriented x3.  No focal neurological deficits.     Lab Results:  Data Reviewed: I have personally reviewed following labs and reports of the imaging studies  CBC: Recent Labs  Lab 02/02/23 1923 02/04/23 0519 02/05/23 0535  WBC 3.4* 3.4* 3.5*  NEUTROABS 1.7  --   --   HGB 10.5* 10.1* 10.3*  HCT 33.9* 32.6* 33.2*  MCV 88.5 88.3 89.0  PLT 130* 118* 131*    Basic Metabolic Panel: Recent Labs  Lab 02/02/23 1923 02/04/23 0519 02/05/23 0535  NA 137 137 138  K 3.7 3.2* 4.2  CL 104 103 104  CO2 25 23 26   GLUCOSE 90 88 87  BUN 18 19 23   CREATININE 0.83 1.12* 0.98  CALCIUM 8.9 8.9 9.1    GFR: Estimated Creatinine Clearance: 18.8 mL/min (by C-G formula based on SCr of 0.98 mg/dL).  Liver Function Tests: Recent Labs  Lab 02/02/23 1923 02/05/23 0530  AST 22 17  ALT 13 11  ALKPHOS 67 43  BILITOT 0.8 0.6  PROT 7.8 6.4*  ALBUMIN 4.4 3.4*     Cardiac Enzymes: Recent Labs  Lab 02/03/23 0614  CKTOTAL 106     CBG: Recent Labs  Lab 02/03/23 1101 02/04/23 1649  GLUCAP 85 111*     Thyroid Function Tests: Recent Labs    02/03/23 1155  TSH 1.322    Radiology Studies: ECHOCARDIOGRAM COMPLETE  Result Date: 02/04/2023    ECHOCARDIOGRAM REPORT   Patient Name:   Sherri Mcmillan Date of Exam: 02/04/2023 Medical Rec #:  [id number]       Height:       48.0 in Accession #:    [id number]      Weight:       114.6 lb Date of Birth:  March 03, 1933       BSA:          1.253 m Patient Age:    89 years        BP:           120/69 mmHg Patient Gender: F               HR:           64 bpm. Exam Location:  Inpatient Procedure: 2D Echo, Cardiac Doppler  and Color Doppler Indications:    I50.40* Unspecified combined systolic (congestive) and diastolic                 (congestive) heart failure  History:        Patient has prior history of Echocardiogram examinations, most  [facility] PROGRESS NOTE   Sherri Mcmillan [id number] DOB: [birth year] DOA: 02/02/2023  PCP: Cain Saupe, MD  Brief History: 89 y.o. female with medical history significant for chronic anxiety, depression, hypertension, sinus bradycardia, CAD, DVT, paroxysmal atrial fibrillation not on Eliquis, status post TAVR 2020 being admitted to the hospital with hypertensive urgency and concern for new onset heart failure.    Consultants: Cardiology  Procedures: Echocardiogram     Subjective/Interval History: Patient mentions occasional pain in the upper abdomen and that sometimes radiates into her chest.  Leg pain is about the same with slight improvement.  No other symptoms offered.     Assessment/Plan:  Accelerated hypertension Came in with significantly elevated blood pressure at admission.  She was seen by cardiology.  She was given IV furosemide. Improvement in blood pressure noted. She was on amlodipine prior to admission which has been discontinued to pedal edema. Metoprolol was resumed.  Blood pressure trends noted to be better but noted to be high again this morning.  Will continue to monitor for now.  He is.  May need to consider alternatives such as ACE inhibitor.  Concern for volume overload She had lower extremity edema and was given furosemide.  She was seen by cardiology.  She is diuresed well.  Edema is improved.   Lower extremity Doppler studies were negative for DVT. Echocardiogram showed normal systolic function.  The aortic valve did not show any acute findings.  TSH is normal. No indication to continue furosemide at this time. Etiology of leg pain is not clear.  Has good pulses.  No obvious lesions identified.  Monitor for now.  Upper abdominal pain Tender in the epigastrium.  LFTs are normal.  Lipase mildly elevated.  Could be gastritis.  Will give her PPI and Carafate.  Will proceed with CT abdomen pelvis considering elevated lipase level.  Paroxysmal  atrial fibrillation Seen by cardiology and started on apixaban.  On metoprolol.  Stable.  History of glaucoma Continue with eyedrops.  Hypokalemia Potassium level is normal today.  Creatinine is normal.  Pancytopenia Stable.  Check anemia panel.  Obesity Estimated body mass index is 32.36 kg/m as calculated from the following:   Height as of this encounter: 4' (1.219 m).   Weight as of this encounter: 48.1 kg.   DVT Prophylaxis: On apixaban Code Status: Full code Family Communication: Discussed with patient Disposition Plan: To mobilize.  Hopefully discharge in 24 to 48 hours  Status is: Inpatient Remains inpatient appropriate because: Accelerated hypertension, volume overload      Medications: Scheduled:  apixaban  2.5 mg Oral BID   brimonidine  1 drop Left Eye BID   dorzolamide-timolol  1 drop Left Eye BID   gabapentin  100 mg Oral TID   latanoprost  1 drop Both Eyes QHS   metoprolol succinate  25 mg Oral Daily   pantoprazole  40 mg Oral Q1200   QUEtiapine  25 mg Oral QHS   sucralfate  1 g Oral TID WC & HS   Continuous: KVQ:QVZDGLOVFIEPP **OR** acetaminophen, albuterol, alum & mag hydroxide-simeth, hydrALAZINE, HYDROcodone-acetaminophen, hydrOXYzine, ondansetron **OR** ondansetron (ZOFRAN) IV, traZODone  Antibiotics: Anti-infectives (From admission, onward)    None       Objective:  Vital Signs  Vitals:   02/04/23 2111 02/05/23 0500 02/05/23 0547 02/05/23 0840  BP: 127/78  134/71 (!) 189/90  Pulse: (!) 56  (!) 52 63  Resp: 17  18 16   Temp: 98.5 F (36.9 C)  98.2 F (  [facility] PROGRESS NOTE   Sherri Mcmillan [id number] DOB: [birth year] DOA: 02/02/2023  PCP: Cain Saupe, MD  Brief History: 89 y.o. female with medical history significant for chronic anxiety, depression, hypertension, sinus bradycardia, CAD, DVT, paroxysmal atrial fibrillation not on Eliquis, status post TAVR 2020 being admitted to the hospital with hypertensive urgency and concern for new onset heart failure.    Consultants: Cardiology  Procedures: Echocardiogram     Subjective/Interval History: Patient mentions occasional pain in the upper abdomen and that sometimes radiates into her chest.  Leg pain is about the same with slight improvement.  No other symptoms offered.     Assessment/Plan:  Accelerated hypertension Came in with significantly elevated blood pressure at admission.  She was seen by cardiology.  She was given IV furosemide. Improvement in blood pressure noted. She was on amlodipine prior to admission which has been discontinued to pedal edema. Metoprolol was resumed.  Blood pressure trends noted to be better but noted to be high again this morning.  Will continue to monitor for now.  He is.  May need to consider alternatives such as ACE inhibitor.  Concern for volume overload She had lower extremity edema and was given furosemide.  She was seen by cardiology.  She is diuresed well.  Edema is improved.   Lower extremity Doppler studies were negative for DVT. Echocardiogram showed normal systolic function.  The aortic valve did not show any acute findings.  TSH is normal. No indication to continue furosemide at this time. Etiology of leg pain is not clear.  Has good pulses.  No obvious lesions identified.  Monitor for now.  Upper abdominal pain Tender in the epigastrium.  LFTs are normal.  Lipase mildly elevated.  Could be gastritis.  Will give her PPI and Carafate.  Will proceed with CT abdomen pelvis considering elevated lipase level.  Paroxysmal  atrial fibrillation Seen by cardiology and started on apixaban.  On metoprolol.  Stable.  History of glaucoma Continue with eyedrops.  Hypokalemia Potassium level is normal today.  Creatinine is normal.  Pancytopenia Stable.  Check anemia panel.  Obesity Estimated body mass index is 32.36 kg/m as calculated from the following:   Height as of this encounter: 4' (1.219 m).   Weight as of this encounter: 48.1 kg.   DVT Prophylaxis: On apixaban Code Status: Full code Family Communication: Discussed with patient Disposition Plan: To mobilize.  Hopefully discharge in 24 to 48 hours  Status is: Inpatient Remains inpatient appropriate because: Accelerated hypertension, volume overload      Medications: Scheduled:  apixaban  2.5 mg Oral BID   brimonidine  1 drop Left Eye BID   dorzolamide-timolol  1 drop Left Eye BID   gabapentin  100 mg Oral TID   latanoprost  1 drop Both Eyes QHS   metoprolol succinate  25 mg Oral Daily   pantoprazole  40 mg Oral Q1200   QUEtiapine  25 mg Oral QHS   sucralfate  1 g Oral TID WC & HS   Continuous: KVQ:QVZDGLOVFIEPP **OR** acetaminophen, albuterol, alum & mag hydroxide-simeth, hydrALAZINE, HYDROcodone-acetaminophen, hydrOXYzine, ondansetron **OR** ondansetron (ZOFRAN) IV, traZODone  Antibiotics: Anti-infectives (From admission, onward)    None       Objective:  Vital Signs  Vitals:   02/04/23 2111 02/05/23 0500 02/05/23 0547 02/05/23 0840  BP: 127/78  134/71 (!) 189/90  Pulse: (!) 56  (!) 52 63  Resp: 17  18 16   Temp: 98.5 F (36.9 C)  98.2 F (

## 2023-02-05 NOTE — Plan of Care (Signed)
°  Problem: Coping: °Goal: Level of anxiety will decrease °Outcome: Progressing °  °

## 2023-02-05 NOTE — Progress Notes (Signed)
Mobility Specialist - Progress Note   02/05/23 0910  Mobility  Activity Ambulated independently in hallway  Level of Assistance Standby assist, set-up cues, supervision of patient - no hands on  Assistive Device None  Distance Ambulated (ft) 100 ft  Range of Motion/Exercises Active  Activity Response Tolerated well  Mobility Referral Yes  $Mobility charge 1 Mobility  Mobility Specialist Start Time (ACUTE ONLY) 0900  Mobility Specialist Stop Time (ACUTE ONLY) 0910  Mobility Specialist Time Calculation (min) (ACUTE ONLY) 10 min   Pt was found in bed and agreeable to ambulate. C/o sharp pain chest. Pt returned to bed at EOS. RN notified pt requesting pain medication. Was left with all needs met. Call bell in reach and bed alarm on.  Billey Chang Mobility Specialist

## 2023-02-06 DIAGNOSIS — R1013 Epigastric pain: Secondary | ICD-10-CM | POA: Diagnosis not present

## 2023-02-06 DIAGNOSIS — I480 Paroxysmal atrial fibrillation: Secondary | ICD-10-CM

## 2023-02-06 LAB — COMPREHENSIVE METABOLIC PANEL
ALT: 12 U/L (ref 0–44)
AST: 18 U/L (ref 15–41)
Albumin: 3.6 g/dL (ref 3.5–5.0)
Alkaline Phosphatase: 53 U/L (ref 38–126)
Anion gap: 8 (ref 5–15)
BUN: 19 mg/dL (ref 8–23)
CO2: 27 mmol/L (ref 22–32)
Calcium: 9.2 mg/dL (ref 8.9–10.3)
Chloride: 102 mmol/L (ref 98–111)
Creatinine, Ser: 0.93 mg/dL (ref 0.44–1.00)
GFR, Estimated: 59 mL/min — ABNORMAL LOW (ref >60)
Glucose, Bld: 100 mg/dL — ABNORMAL HIGH (ref 70–99)
Potassium: 4.2 mmol/L (ref 3.5–5.1)
Sodium: 137 mmol/L (ref 135–145)
Total Bilirubin: 0.6 mg/dL (ref 0.3–1.2)
Total Protein: 6.7 g/dL (ref 6.5–8.1)

## 2023-02-06 LAB — RETICULOCYTES
Immature Retic Fract: 6 % (ref 2.3–15.9)
RBC.: 3.76 MIL/uL — ABNORMAL LOW (ref 3.87–5.11)
Retic Count, Absolute: 32.3 10*3/uL (ref 19.0–186.0)
Retic Ct Pct: 0.9 % (ref 0.4–3.1)

## 2023-02-06 LAB — LIPASE, BLOOD: Lipase: 47 U/L (ref 11–51)

## 2023-02-06 LAB — CBC
HCT: 33.4 % — ABNORMAL LOW (ref 36.0–46.0)
Hemoglobin: 10.3 g/dL — ABNORMAL LOW (ref 12.0–15.0)
MCH: 27.3 pg (ref 26.0–34.0)
MCHC: 30.8 g/dL (ref 30.0–36.0)
MCV: 88.6 fL (ref 80.0–100.0)
Platelets: 125 10*3/uL — ABNORMAL LOW (ref 150–400)
RBC: 3.77 MIL/uL — ABNORMAL LOW (ref 3.87–5.11)
RDW: 13.6 % (ref 11.5–15.5)
WBC: 4.8 10*3/uL (ref 4.0–10.5)
nRBC: 0 % (ref 0.0–0.2)

## 2023-02-06 LAB — URINALYSIS, ROUTINE W REFLEX MICROSCOPIC
Bilirubin Urine: NEGATIVE
Glucose, UA: NEGATIVE mg/dL
Hgb urine dipstick: NEGATIVE
Ketones, ur: NEGATIVE mg/dL
Leukocytes,Ua: NEGATIVE
Nitrite: NEGATIVE
Protein, ur: NEGATIVE mg/dL
Specific Gravity, Urine: 1.015 (ref 1.005–1.030)
pH: 5 (ref 5.0–8.0)

## 2023-02-06 LAB — FERRITIN: Ferritin: 9 ng/mL — ABNORMAL LOW (ref 11–307)

## 2023-02-06 LAB — IRON AND TIBC
Iron: 56 ug/dL (ref 28–170)
Saturation Ratios: 14 % (ref 10.4–31.8)
TIBC: 391 ug/dL (ref 250–450)
UIBC: 335 ug/dL

## 2023-02-06 LAB — VITAMIN B12: Vitamin B-12: 234 pg/mL (ref 180–914)

## 2023-02-06 LAB — FOLATE: Folate: 9.8 ng/mL (ref >5.9)

## 2023-02-06 MED ORDER — CYANOCOBALAMIN 500 MCG PO TABS
500.0000 ug | ORAL_TABLET | Freq: Every day | ORAL | Status: DC
  Administered 2023-02-06 – 2023-02-11 (×5): 500 ug via ORAL
  Filled 2023-02-06 (×5): qty 1

## 2023-02-06 MED ORDER — PANTOPRAZOLE SODIUM 40 MG PO TBEC
40.0000 mg | DELAYED_RELEASE_TABLET | Freq: Two times a day (BID) | ORAL | Status: DC
  Administered 2023-02-06 – 2023-02-11 (×10): 40 mg via ORAL
  Filled 2023-02-06 (×10): qty 1

## 2023-02-06 NOTE — Plan of Care (Signed)
Problem: Education: Goal: Knowledge of General Education information will improve Description: Including pain rating scale, medication(s)/side effects and non-pharmacologic comfort measures Outcome: Progressing   Problem: Clinical Measurements: Goal: Ability to maintain clinical measurements within normal limits will improve Outcome: Progressing Goal: Diagnostic test results will improve Outcome: Progressing   Problem: Safety: Goal: Ability to remain free from injury will improve Outcome: Progressing

## 2023-02-06 NOTE — Progress Notes (Signed)
[facility] PROGRESS NOTE   ETTER ROYALL WFU:[id number] DOB: Aug 28, 1932 DOA: 02/02/2023  PCP: Cain Saupe, MD  Brief History: 89 y.o. female with medical history significant for chronic anxiety, depression, hypertension, sinus bradycardia, CAD, DVT, paroxysmal atrial fibrillation not on Eliquis, status post TAVR 2020 being admitted to the hospital with hypertensive urgency and concern for new onset heart failure.    Consultants: Cardiology  Procedures: Echocardiogram     Subjective/Interval History: Patient continues to have upper abdominal discomfort.  Occasionally radiates into her chest.  Quite tearful this morning.  Frustrated by lack of improvement.    Assessment/Plan:  Accelerated hypertension Came in with significantly elevated blood pressure at admission.  She was seen by cardiology.  She was given IV furosemide. Improvement in blood pressure noted. She was on amlodipine prior to admission which has been discontinued to pedal edema. Metoprolol was resumed.  Blood pressures are stable for the most part.  Cannot go up on the dose of metoprolol since she is bradycardic.  If blood pressure remains elevated other alternatives may need to be considered such as ACE inhibitor.    Concern for volume overload She had lower extremity edema and was given furosemide.  She was seen by cardiology.  She is diuresed well.  Edema is improved.   Lower extremity Doppler studies were negative for DVT. Echocardiogram showed normal systolic function.  The aortic valve did not show any acute findings.  TSH is normal. No indication to continue furosemide at this time. Etiology of leg pain is not clear.  Has good pulses.  No obvious lesions identified.  Monitor for now.  Upper abdominal pain Continues to have upper abdominal discomfort.  Lipase level was mildly elevated yesterday.  CT of the abdomen pelvis did not show any evidence for pancreatitis.  Gallstones were noted but LFTs are  normal. She was started on PPI and Carafate.  Will increase the frequency of PPI today.  Patient was reassured.  She is tolerating her diet.   No evidence for GI bleed.  Of note she last had upper endoscopy in 2018 which showed esophagitis and dysmotility.  CT scan did show moderate hiatal hernia.  It looks like in 2020 she was experiencing dysphagia.  She had a takedown of her Nissen fundoplication and of the hiatal hernia at that time. So it is quite likely that her symptoms are due to the hiatal hernia and esophagitis.  Hopefully with higher dose of PPI and Carafate her symptoms will improve.  UA is unremarkable.  Paroxysmal atrial fibrillation Seen by cardiology and started on apixaban.  On metoprolol.  Stable.  History of glaucoma Continue with eyedrops.  Hypokalemia Supplemented  Pancytopenia/iron deficiency Stable.  Anemia panel reviewed.  Ferritin is 9, iron 56, TIBC 391, percent saturation 14.  Folic acid is 9.8.  Vitamin B12 234.  Will start on B12 supplements.  Will benefit from iron supplements at discharge.  Obesity Estimated body mass index is 34.85 kg/m as calculated from the following:   Height as of this encounter: 4' (1.219 m).   Weight as of this encounter: 51.8 kg.   DVT Prophylaxis: On apixaban Code Status: Full code Family Communication: Discussed with patient Disposition Plan: Continue to mobilize.  Await improvement in her abdominal symptoms.  Status is: Inpatient Remains inpatient appropriate because: Accelerated hypertension, volume overload      Medications: Scheduled:  apixaban  2.5 mg Oral BID   brimonidine  1 drop Left Eye BID   vitamin B-12  standard protocol following bolus administration of intravenous contrast. RADIATION DOSE REDUCTION: This exam was performed according to the departmental dose-optimization program which includes automated exposure control, adjustment of the mA and/or kV according to patient size and/or use of iterative reconstruction technique. CONTRAST:  OMNIPAQUE IOHEXOL 300 MG/ML  SOLN COMPARISON:  CT abdomen and pelvis dated 08/05/2021. FINDINGS: Lower chest: There is a moderate hiatal hernia. Surgical clips are seen at the gastroesophageal junction. Hepatobiliary: A 1.1 cm cyst is seen in the left hepatic lobe. Gallstones are seen in the gallbladder neck. No gallbladder wall thickening or biliary dilatation. Pancreas: Unremarkable. No pancreatic ductal dilatation or surrounding inflammatory changes. Spleen: Normal in size without focal abnormality. Adrenals/Urinary Tract: Adrenal glands are unremarkable. Kidneys are normal, without renal calculi, focal lesion, or hydronephrosis. There is mild diffuse urinary bladder wall thickening, similar to prior exam. Stomach/Bowel: Stomach is within normal limits. Enteric contrast reaches the splenic flexure. Appendix is not identified and likely absent.  There is colonic diverticulosis without evidence of diverticulitis. No evidence of bowel wall thickening, distention, or inflammatory changes. Vascular/Lymphatic: Aortic atherosclerosis. No enlarged abdominal or pelvic lymph nodes. Reproductive: Status post hysterectomy. No adnexal masses. Other: No abdominal wall hernia or abnormality. No abdominopelvic ascites. Musculoskeletal: Degenerative changes are seen in the spine. IMPRESSION: 1. Mild diffuse urinary bladder wall thickening is similar to prior exam. There is a nonspecific but can be seen in the setting of cystitis. 2. Moderate hiatal hernia. 3. Cholelithiasis without evidence of cholecystitis. Aortic Atherosclerosis (ICD10-I70.0). Electronically Signed   By: Romona Curls M.D.   On: 02/05/2023 18:21   ECHOCARDIOGRAM COMPLETE  Result Date: 02/04/2023    ECHOCARDIOGRAM REPORT   Patient Name:   Sherri Mcmillan Date of Exam: 02/04/2023 Medical Rec #:  [id number]       Height:       48.0 in Accession #:    [id number]      Weight:       114.6 lb Date of Birth:  1932/08/17       BSA:          1.253 m Patient Age:    89 years        BP:           120/69 mmHg Patient Gender: F               HR:           64 bpm. Exam Location:  Inpatient Procedure: 2D Echo, Cardiac Doppler and Color Doppler Indications:    I50.40* Unspecified combined systolic (congestive) and diastolic                 (congestive) heart failure  History:        Patient has prior history of Echocardiogram examinations, most                 recent 05/30/2018. Abnormal ECG, Aortic Valve Disease,                 Arrythmias:Atrial Fibrillation; Risk Factors:Hypertension and                 Dyslipidemia. Aortic stenosis. TAVR.  Sonographer:    Sheralyn Boatman RDCS Referring Phys: [id number] [facility] IMPRESSIONS  1. Left ventricular ejection fraction, by estimation, is 60 to 65%. The left ventricle has normal function. The left ventricle has no regional wall motion abnormalities. Left ventricular  diastolic parameters are indeterminate. Elevated left atrial pressure. The E/e' is 22.  [facility] PROGRESS NOTE   ETTER ROYALL WFU:[id number] DOB: Aug 28, 1932 DOA: 02/02/2023  PCP: Cain Saupe, MD  Brief History: 89 y.o. female with medical history significant for chronic anxiety, depression, hypertension, sinus bradycardia, CAD, DVT, paroxysmal atrial fibrillation not on Eliquis, status post TAVR 2020 being admitted to the hospital with hypertensive urgency and concern for new onset heart failure.    Consultants: Cardiology  Procedures: Echocardiogram     Subjective/Interval History: Patient continues to have upper abdominal discomfort.  Occasionally radiates into her chest.  Quite tearful this morning.  Frustrated by lack of improvement.    Assessment/Plan:  Accelerated hypertension Came in with significantly elevated blood pressure at admission.  She was seen by cardiology.  She was given IV furosemide. Improvement in blood pressure noted. She was on amlodipine prior to admission which has been discontinued to pedal edema. Metoprolol was resumed.  Blood pressures are stable for the most part.  Cannot go up on the dose of metoprolol since she is bradycardic.  If blood pressure remains elevated other alternatives may need to be considered such as ACE inhibitor.    Concern for volume overload She had lower extremity edema and was given furosemide.  She was seen by cardiology.  She is diuresed well.  Edema is improved.   Lower extremity Doppler studies were negative for DVT. Echocardiogram showed normal systolic function.  The aortic valve did not show any acute findings.  TSH is normal. No indication to continue furosemide at this time. Etiology of leg pain is not clear.  Has good pulses.  No obvious lesions identified.  Monitor for now.  Upper abdominal pain Continues to have upper abdominal discomfort.  Lipase level was mildly elevated yesterday.  CT of the abdomen pelvis did not show any evidence for pancreatitis.  Gallstones were noted but LFTs are  normal. She was started on PPI and Carafate.  Will increase the frequency of PPI today.  Patient was reassured.  She is tolerating her diet.   No evidence for GI bleed.  Of note she last had upper endoscopy in 2018 which showed esophagitis and dysmotility.  CT scan did show moderate hiatal hernia.  It looks like in 2020 she was experiencing dysphagia.  She had a takedown of her Nissen fundoplication and of the hiatal hernia at that time. So it is quite likely that her symptoms are due to the hiatal hernia and esophagitis.  Hopefully with higher dose of PPI and Carafate her symptoms will improve.  UA is unremarkable.  Paroxysmal atrial fibrillation Seen by cardiology and started on apixaban.  On metoprolol.  Stable.  History of glaucoma Continue with eyedrops.  Hypokalemia Supplemented  Pancytopenia/iron deficiency Stable.  Anemia panel reviewed.  Ferritin is 9, iron 56, TIBC 391, percent saturation 14.  Folic acid is 9.8.  Vitamin B12 234.  Will start on B12 supplements.  Will benefit from iron supplements at discharge.  Obesity Estimated body mass index is 34.85 kg/m as calculated from the following:   Height as of this encounter: 4' (1.219 m).   Weight as of this encounter: 51.8 kg.   DVT Prophylaxis: On apixaban Code Status: Full code Family Communication: Discussed with patient Disposition Plan: Continue to mobilize.  Await improvement in her abdominal symptoms.  Status is: Inpatient Remains inpatient appropriate because: Accelerated hypertension, volume overload      Medications: Scheduled:  apixaban  2.5 mg Oral BID   brimonidine  1 drop Left Eye BID   vitamin B-12  [facility] PROGRESS NOTE   ETTER ROYALL WFU:[id number] DOB: Aug 28, 1932 DOA: 02/02/2023  PCP: Cain Saupe, MD  Brief History: 89 y.o. female with medical history significant for chronic anxiety, depression, hypertension, sinus bradycardia, CAD, DVT, paroxysmal atrial fibrillation not on Eliquis, status post TAVR 2020 being admitted to the hospital with hypertensive urgency and concern for new onset heart failure.    Consultants: Cardiology  Procedures: Echocardiogram     Subjective/Interval History: Patient continues to have upper abdominal discomfort.  Occasionally radiates into her chest.  Quite tearful this morning.  Frustrated by lack of improvement.    Assessment/Plan:  Accelerated hypertension Came in with significantly elevated blood pressure at admission.  She was seen by cardiology.  She was given IV furosemide. Improvement in blood pressure noted. She was on amlodipine prior to admission which has been discontinued to pedal edema. Metoprolol was resumed.  Blood pressures are stable for the most part.  Cannot go up on the dose of metoprolol since she is bradycardic.  If blood pressure remains elevated other alternatives may need to be considered such as ACE inhibitor.    Concern for volume overload She had lower extremity edema and was given furosemide.  She was seen by cardiology.  She is diuresed well.  Edema is improved.   Lower extremity Doppler studies were negative for DVT. Echocardiogram showed normal systolic function.  The aortic valve did not show any acute findings.  TSH is normal. No indication to continue furosemide at this time. Etiology of leg pain is not clear.  Has good pulses.  No obvious lesions identified.  Monitor for now.  Upper abdominal pain Continues to have upper abdominal discomfort.  Lipase level was mildly elevated yesterday.  CT of the abdomen pelvis did not show any evidence for pancreatitis.  Gallstones were noted but LFTs are  normal. She was started on PPI and Carafate.  Will increase the frequency of PPI today.  Patient was reassured.  She is tolerating her diet.   No evidence for GI bleed.  Of note she last had upper endoscopy in 2018 which showed esophagitis and dysmotility.  CT scan did show moderate hiatal hernia.  It looks like in 2020 she was experiencing dysphagia.  She had a takedown of her Nissen fundoplication and of the hiatal hernia at that time. So it is quite likely that her symptoms are due to the hiatal hernia and esophagitis.  Hopefully with higher dose of PPI and Carafate her symptoms will improve.  UA is unremarkable.  Paroxysmal atrial fibrillation Seen by cardiology and started on apixaban.  On metoprolol.  Stable.  History of glaucoma Continue with eyedrops.  Hypokalemia Supplemented  Pancytopenia/iron deficiency Stable.  Anemia panel reviewed.  Ferritin is 9, iron 56, TIBC 391, percent saturation 14.  Folic acid is 9.8.  Vitamin B12 234.  Will start on B12 supplements.  Will benefit from iron supplements at discharge.  Obesity Estimated body mass index is 34.85 kg/m as calculated from the following:   Height as of this encounter: 4' (1.219 m).   Weight as of this encounter: 51.8 kg.   DVT Prophylaxis: On apixaban Code Status: Full code Family Communication: Discussed with patient Disposition Plan: Continue to mobilize.  Await improvement in her abdominal symptoms.  Status is: Inpatient Remains inpatient appropriate because: Accelerated hypertension, volume overload      Medications: Scheduled:  apixaban  2.5 mg Oral BID   brimonidine  1 drop Left Eye BID   vitamin B-12  [facility] PROGRESS NOTE   ETTER ROYALL WFU:[id number] DOB: Aug 28, 1932 DOA: 02/02/2023  PCP: Cain Saupe, MD  Brief History: 89 y.o. female with medical history significant for chronic anxiety, depression, hypertension, sinus bradycardia, CAD, DVT, paroxysmal atrial fibrillation not on Eliquis, status post TAVR 2020 being admitted to the hospital with hypertensive urgency and concern for new onset heart failure.    Consultants: Cardiology  Procedures: Echocardiogram     Subjective/Interval History: Patient continues to have upper abdominal discomfort.  Occasionally radiates into her chest.  Quite tearful this morning.  Frustrated by lack of improvement.    Assessment/Plan:  Accelerated hypertension Came in with significantly elevated blood pressure at admission.  She was seen by cardiology.  She was given IV furosemide. Improvement in blood pressure noted. She was on amlodipine prior to admission which has been discontinued to pedal edema. Metoprolol was resumed.  Blood pressures are stable for the most part.  Cannot go up on the dose of metoprolol since she is bradycardic.  If blood pressure remains elevated other alternatives may need to be considered such as ACE inhibitor.    Concern for volume overload She had lower extremity edema and was given furosemide.  She was seen by cardiology.  She is diuresed well.  Edema is improved.   Lower extremity Doppler studies were negative for DVT. Echocardiogram showed normal systolic function.  The aortic valve did not show any acute findings.  TSH is normal. No indication to continue furosemide at this time. Etiology of leg pain is not clear.  Has good pulses.  No obvious lesions identified.  Monitor for now.  Upper abdominal pain Continues to have upper abdominal discomfort.  Lipase level was mildly elevated yesterday.  CT of the abdomen pelvis did not show any evidence for pancreatitis.  Gallstones were noted but LFTs are  normal. She was started on PPI and Carafate.  Will increase the frequency of PPI today.  Patient was reassured.  She is tolerating her diet.   No evidence for GI bleed.  Of note she last had upper endoscopy in 2018 which showed esophagitis and dysmotility.  CT scan did show moderate hiatal hernia.  It looks like in 2020 she was experiencing dysphagia.  She had a takedown of her Nissen fundoplication and of the hiatal hernia at that time. So it is quite likely that her symptoms are due to the hiatal hernia and esophagitis.  Hopefully with higher dose of PPI and Carafate her symptoms will improve.  UA is unremarkable.  Paroxysmal atrial fibrillation Seen by cardiology and started on apixaban.  On metoprolol.  Stable.  History of glaucoma Continue with eyedrops.  Hypokalemia Supplemented  Pancytopenia/iron deficiency Stable.  Anemia panel reviewed.  Ferritin is 9, iron 56, TIBC 391, percent saturation 14.  Folic acid is 9.8.  Vitamin B12 234.  Will start on B12 supplements.  Will benefit from iron supplements at discharge.  Obesity Estimated body mass index is 34.85 kg/m as calculated from the following:   Height as of this encounter: 4' (1.219 m).   Weight as of this encounter: 51.8 kg.   DVT Prophylaxis: On apixaban Code Status: Full code Family Communication: Discussed with patient Disposition Plan: Continue to mobilize.  Await improvement in her abdominal symptoms.  Status is: Inpatient Remains inpatient appropriate because: Accelerated hypertension, volume overload      Medications: Scheduled:  apixaban  2.5 mg Oral BID   brimonidine  1 drop Left Eye BID   vitamin B-12

## 2023-02-06 NOTE — Plan of Care (Signed)
Problem: Coping: Goal: Level of anxiety will decrease Outcome: Not Progressing

## 2023-02-07 DIAGNOSIS — K2270 Barrett's esophagus without dysplasia: Secondary | ICD-10-CM

## 2023-02-07 DIAGNOSIS — K8020 Calculus of gallbladder without cholecystitis without obstruction: Secondary | ICD-10-CM | POA: Diagnosis not present

## 2023-02-07 DIAGNOSIS — I10 Essential (primary) hypertension: Secondary | ICD-10-CM | POA: Diagnosis not present

## 2023-02-07 DIAGNOSIS — R1013 Epigastric pain: Secondary | ICD-10-CM | POA: Diagnosis not present

## 2023-02-07 DIAGNOSIS — R1011 Right upper quadrant pain: Secondary | ICD-10-CM

## 2023-02-07 DIAGNOSIS — K219 Gastro-esophageal reflux disease without esophagitis: Secondary | ICD-10-CM | POA: Diagnosis not present

## 2023-02-07 DIAGNOSIS — I480 Paroxysmal atrial fibrillation: Secondary | ICD-10-CM | POA: Diagnosis not present

## 2023-02-07 MED ORDER — HYDROCODONE-ACETAMINOPHEN 5-325 MG PO TABS
1.0000 | ORAL_TABLET | ORAL | Status: DC | PRN
  Administered 2023-02-07 – 2023-02-11 (×13): 1 via ORAL
  Filled 2023-02-07 (×13): qty 1

## 2023-02-07 MED ORDER — APIXABAN 2.5 MG PO TABS: 2.5000 mg | ORAL_TABLET | Freq: Two times a day (BID) | ORAL | Status: DC

## 2023-02-07 NOTE — H&P (View-Only) (Signed)
Consultation  Primary Care Physician:  Cain Saupe, MD Primary Gastroenterologist:  Dr. Myrtie Neither       Reason for Consultation:  DOA: 02/02/2023         Hospital Day: 6         HPI:   Sherri Mcmillan is a 89 y.o. female with past medical history significant for chronic anxiety, depression, hypertension, sinus bradycardia, CAD, DVT, paroxysmal atrial fibrillation not on Eliquis, status post TAVR 2020, admitted to the hospital on 10/10 for hypertensive urgency and bilateral LE edema.   Work up in ED notable for: NSR. [place] LE doppler-no CFV obstruction in R, no DVT in L.Troponin neg x2, BNP 259, abnormal WBC 3.4, Hgb 10.5, plt 130. albumin wnl. SBP was up to 236/113. She received 5mg  amlodipine, 20mg  IV Lasix, 10 + 5mg  IV hydralazine, Vicodin x 2, and Zofran.   02/05/2023 CT scan shows mild diffuse urinary bladder thickening, moderate HH, and cholelithiasis without evidence of cholecystitis.  02/04/2023 ECHO Left ventricular ejection fraction, by estimation, is 60 to 65%.   Patient last saw Dr. Myrtie Neither on 7//2019 symptoms of heartburn, regurgitation, nausea.  At that time he discussed with her that the recurrent hiatal hernia could be contributing to her severe reflux symptoms.  He advised her to consult with Dr. Daphine Deutscher about revision of the surgery for repeat hiatal hernia repair.  Procedures (see full report below) 01/09/17 EGD for dysphagia and GERD 08/24/2017 PH with Impedence study 08/24/2017 Esophageal Motility study 12/01/2018 Redo HH repair takedown with Dr. Daphine Deutscher.  Patient reports she has had intermittent epigastric pain for several years that can lasts a few minutes up to several days. She reports the severity recently increased 10/10 this past week radiating to her left side. She reports it is not exacerbated with eating. She reports pain medication, heating pad, and drinking water help ease the discomfort. 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     She has  history of GERD and reports daily pyrosis and mild dysphagia intermittently. She reports taking her Dexilant at home daily. Mild nausea with poor appetite. Reports some days she does not eat anything. Last BM was on Thursday 10/10. No blood per rectum. Reports intermittent constipation. Reports SOB when the pain increases. Reports left leg pain that has improved since admission. Former smoker. No alcohol use.    She has received PPI BID and sucralfate in hospital without improvement.  Hx of afib- reports she has not been on [facility] for over a year.  No family was present at the time of my evaluation.   Previous GI workup:   02/05/2023 CT ABDOMEN AND PELVIS WITH CONTRAST  IMPRESSION: 1. Mild diffuse urinary bladder wall thickening is similar to prior exam. There is a nonspecific but can be seen in the setting of cystitis. 2. Moderate hiatal hernia. 3. Cholelithiasis without evidence of cholecystitis.  08/24/2017 Esophageal motility study Esophageal motility study revealed normal relaxation of the EG junction.  No significant esophageal peristolic abnormality detected on the study.  Poor bolus clearance likely secondary to hiatal hernia and reflux  08/24/2017 PH with Impedence study Poor acid suppression with evidence of increased gastroesophageal acid reflux.  01/09/17 EGD with Dr. Myrtie Neither for dysphagia, heartburn  Impression:  - Normal larynx.  - LA Grade B reflux esophagitis.  - Esophageal mucosal changes suspicious for short- segment Barrett' s esophagus. Biopsied.  - 5 cm hiatal hernia.  - A fundoplication was found.  The wrap appears intact.  - Normal examined duodenum.  Diagnosis Surgical [P], distal esophagus - INTESTINAL METAPLASIA (GOBLET CELL METAPLASIA) CONSISTENT WITH BARRETT'S ESOPHAGUS. - REFLUX CHANGES. - NO DYSPLASIA OR MALIGNANCY. JULIA  12/11/2010 NM gastric emptying study showed at , 49% of activity remained within the stomach.  Past Medical History:  Diagnosis  Date   Anemia 12/14/2012   Ataxia 06/24/2016   Auditory hallucinations 08/19/2010   Has has auditory hallucinations, which seem to have worsened since death of her sister 01/14/10. Admission to [facility] (90s or early 2000)  for 6 weeks after mother died, also with hallucinations.  B/C of severe mental illness and refusal to see pysch, I have refused to refill controlled meds. If she decides to pursue mental health, then she needs to take the initiative and sch the appt bc [name] has s   Barrett's esophagus    Demonstrated on EGD 01/15/2011. EGD 09/17/2012 shows inflamed GE junctional mucosa without metaplasia, dysplasia, or malignancy.   Cataract    Chronic anxiety    Admission to [facility] (90s or early 2000)  for 6 weeks after mother died. Complicated again by death of her sister 2010. 2012 developed hallucinations and I refused to refill controlled meds unless she see psych which she is not agreable to   Chronic insomnia    Depression    Diverticulosis    DVT (deep venous thrombosis) (HCC)    Gallstones    Gastroparesis    Demonstrated on GES 2011/01/15 by Dr Dalene Seltzer   GERD (gastroesophageal reflux disease)    describes " I sometimes have a hard time talking or swallowing" - relative to the reflux   Hiatal hernia    HLD (hyperlipidemia) 01/2010   HTN (hypertension)    Controlled with 2 drug therapy   Insomnia 07/27/2006   Left leg DVT (HCC) 09/28/2012   Migraine    Pulmonary nodule 02/10/2014   CT 06/2015 shows stable pulm nodules compared to prior study 2015, so likely benign.      S/P TAVR (transcatheter aortic valve replacement) 05/30/2018   s/p 26 mm Medtronic Evolut Pro Plus via TF approach on 05/30/18   Severe aortic stenosis     Surgical History:  She  has a past surgical history that includes Direct laryngoscopy 01/15/07); Meniscectomy ( July 2002); Abdominal hysterectomy; Hemorrhoid surgery; Appendectomy; Cataract extraction; Polypectomy (2000); Colonoscopy (2000&2005);  Esophagogastroduodenoscopy (11/2010); Knee arthroscopy; Rotator cuff repair (09/21/2011); Esophagogastroduodenoscopy (N/A, 09/17/2012); Esophagogastroduodenoscopy (N/A, 12/17/2012); Laparoscopic Nissen fundoplication (N/A, 05/02/2013); Endovenous ablation saphenous vein w/ laser (Left, 05-09-2014); Excision mass head (N/A, 03/05/2016); Esophageal manometry (N/A, 08/24/2017); 24 hour ph study (N/A, 08/24/2017); Tooth Extraction (Bilateral, 04/05/2018); RIGHT/LEFT HEART CATH AND CORONARY ANGIOGRAPHY (N/A, 04/06/2018); Eye surgery (Bilateral); Cardiac catheterization; Transcatheter aortic valve replacement, transfemoral (N/A, 05/30/2018); TEE without cardioversion (N/A, 05/30/2018); and Hiatal hernia repair (N/A, 12/01/2018). Family History:  Her family history includes Diabetes in her brother; Heart attack in her father and mother; Heart disease in her mother; Hypertension in her father and mother. Social History:   reports that she quit smoking about 39 years ago. Her smoking use included cigarettes. She started smoking about 89 years ago. She has never used smokeless tobacco. She reports that she does not drink alcohol and does not use drugs.  Prior to Admission medications   Medication Sig Start Date End Date Taking? Authorizing Provider  brimonidine (ALPHAGAN) 0.2 % ophthalmic solution Place 1 drop into the left eye 2 (two) times daily. 11/30/22  Yes [provider]  DEXILANT 60 MG capsule TAKE 1 CAPSULE BY MOUTH EVERY DAY Patient taking differently: Take 60 mg by mouth daily. 09/11/18  Yes Danis, Andreas Blower, MD  dorzolamide-timolol (COSOPT) 22.3-6.8 MG/ML ophthalmic solution Place 1 drop into the left eye 2 (two) times daily. 07/17/21  Yes [provider]  gabapentin (NEURONTIN) 100 MG capsule Take 100 mg by mouth 3 (three) times daily.   Yes [provider]  latanoprost (XALATAN) 0.005 % ophthalmic solution Place 1 drop into both eyes at bedtime. 07/17/21  Yes [provider]   QUEtiapine (SEROQUEL) 25 MG tablet Take 25 mg by mouth at bedtime. 12/13/22  Yes [provider]  ELIQUIS 5 MG TABS tablet Take 5 mg by mouth 2 (two) times daily. 02/02/23   [provider]  hydrOXYzine (ATARAX) 25 MG tablet Take 25 mg by mouth 3 (three) times daily. 02/02/23   [provider]  metoprolol succinate (TOPROL-XL) 25 MG 24 hr tablet Take 25 mg by mouth daily. 02/02/23   [provider]    Current Facility-Administered Medications  Medication Dose Route Frequency Provider Last Rate Last Admin   acetaminophen (TYLENOL) tablet 650 mg  650 mg Oral Q6H PRN Kirby Crigler, Mir M, MD   650 mg at 02/07/23 0141   Or   acetaminophen (TYLENOL) suppository 650 mg  650 mg Rectal Q6H PRN Kirby Crigler, Mir M, MD       albuterol (PROVENTIL) (2.5 MG/3ML) 0.083% nebulizer solution 2.5 mg  2.5 mg Nebulization Q2H PRN Kirby Crigler, Mir M, MD       alum & mag hydroxide-simeth (MAALOX/MYLANTA) 200-200-20 MG/5ML suspension 15 mL  15 mL Oral Q6H PRN Osvaldo Shipper, MD   15 mL at 02/05/23 1819   apixaban (ELIQUIS) tablet 2.5 mg  2.5 mg Oral BID Kirby Crigler, Mir M, MD   2.5 mg at 02/06/23 2101   brimonidine (ALPHAGAN) 0.2 % ophthalmic solution 1 drop  1 drop Left Eye BID Kirby Crigler, Mir M, MD   1 drop at 02/07/23 1049   cyanocobalamin (VITAMIN B12) tablet 500 mcg  500 mcg Oral Daily Osvaldo Shipper, MD   500 mcg at 02/07/23 1047   dorzolamide-timolol (COSOPT) 2-0.5 % ophthalmic solution 1 drop  1 drop Left Eye BID Kirby Crigler, Mir M, MD   1 drop at 02/06/23 2101   gabapentin (NEURONTIN) capsule 100 mg  100 mg Oral TID Maryln Gottron, MD   100 mg at 02/07/23 1047   hydrALAZINE (APRESOLINE) injection 5 mg  5 mg Intravenous Q6H PRN Maryln Gottron, MD       HYDROcodone-acetaminophen (NORCO/VICODIN) 5-325 MG per tablet 1 tablet  1 tablet Oral Q6H PRN Osvaldo Shipper, MD   1 tablet at 02/07/23 0759   hydrOXYzine (ATARAX) tablet 25 mg  25 mg Oral TID PRN Maryln Gottron, MD   25 mg  at 02/04/23 2156   latanoprost (XALATAN) 0.005 % ophthalmic solution 1 drop  1 drop Both Eyes QHS Kirby Crigler, Mir M, MD   1 drop at 02/06/23 2101   metoprolol succinate (TOPROL-XL) 24 hr tablet 25 mg  25 mg Oral Daily Osvaldo Shipper, MD   25 mg at 02/06/23 1002   ondansetron (ZOFRAN) tablet 4 mg  4 mg Oral Q6H PRN Kirby Crigler, Mir M, MD       Or   ondansetron [facility]) injection 4 mg  4 mg Intravenous Q6H PRN Kirby Crigler, Mir M, MD   4 mg at 02/05/23 1819   pantoprazole (PROTONIX) EC tablet 40 mg  40 mg Oral BID Osvaldo Shipper, MD   40 mg at 02/07/23 1047   QUEtiapine (SEROQUEL) tablet 25 mg  25 mg Oral QHS Kirby Crigler, Mir M, MD   25 mg at 02/06/23 2101   sucralfate (CARAFATE) 1 GM/10ML suspension 1 g  1 g Oral TID WC & HS Osvaldo Shipper, MD   1 g at 02/06/23 2100   traZODone (DESYREL) tablet 25 mg  25 mg Oral QHS PRN Maryln Gottron, MD        Allergies as of 02/02/2023 - Review Complete 02/02/2023  Allergen Reaction Noted   Aspirin Itching and Rash 07/06/2006    Review of Systems:    Constitutional: No weight loss, fever, chills, weakness or fatigue HEENT: Eyes: No change in vision               Ears, Nose, Throat:  No change in hearing or congestion Skin: No rash or itching Cardiovascular: No chest pain, chest pressure or palpitations   Respiratory: No SOB or cough Gastrointestinal: See HPI and otherwise negative Genitourinary: No dysuria or change in urinary frequency Neurological: No headache, dizziness or syncope Musculoskeletal: No new muscle or joint pain Hematologic: No bleeding or bruising Psychiatric: No history of depression or anxiety     Physical Exam:  Vital signs in last 24 hours: Temp:  [98.1 F (36.7 C)-99.1 F (37.3 C)] 98.4 F (36.9 C) (10/14 0634) Pulse Rate:  [48-67] 56 (10/14 1048) Resp:  [16-18] 18 (10/14 0634) BP: (101-149)/(63-77) 148/72 (10/14 0634) SpO2:  [97 %-100 %] 98 % (10/14 0634) Weight:  [51.2 kg] 51.2 kg (10/14 0500) Last BM Date :  02/04/23 Last BM recorded by nurses in past 5 days No data recorded  General:  Tearful but pleasant petite female expressing she is in constant pain. Head:  Normocephalic and atraumatic. Eyes: sclerae anicteric Heart:  regular rate and rhythm, no murmurs or gallops. Epigastric pain with palpation. Pulm: Clear anteriorly; no wheezing, on RA Abdomen:  Soft, Flat AB, Hypoactive bowel sounds. mild tenderness in the epigastrium with palpation. Without guarding and Without rebound, No organomegaly appreciated. Extremities:  With trace edema. Tenderness to left leg Msk:  Symmetrical without gross deformities. Peripheral pulses intact.  Neurologic:  Alert and  oriented x4;  No focal deficits.  Skin:   Dry and intact without significant lesions or rashes. Psychiatric:  Cooperative. Very tearful.  LAB RESULTS: Recent Labs    02/05/23 0535 02/06/23 0528  WBC 3.5* 4.8  HGB 10.3* 10.3*  HCT 33.2* 33.4*  PLT 131* 125*   BMET Recent Labs    02/05/23 0535 02/06/23 0528  NA 138 137  K 4.2 4.2  CL 104 102  CO2 26 27  GLUCOSE 87 100*  BUN 23 19  CREATININE 0.98 0.93  CALCIUM 9.1 9.2   LFT Recent Labs    02/05/23 0530 02/06/23 0528  PROT 6.4* 6.7  ALBUMIN 3.4* 3.6  AST 17 18  ALT 11 12  ALKPHOS 43 53  BILITOT 0.6 0.6  BILIDIR <0.1  --   IBILI NOT CALCULATED  --    PT/INR No results for input(s): "LABPROT", "INR" in the last 72 hours.  STUDIES: CT ABDOMEN PELVIS W CONTRAST  Result Date: 02/05/2023 CLINICAL DATA:  Abdominal pain. EXAM: CT ABDOMEN AND PELVIS WITH CONTRAST TECHNIQUE: Multidetector CT imaging of the abdomen and pelvis was performed using the standard protocol following bolus administration of intravenous contrast. RADIATION DOSE REDUCTION: This exam was performed according to the departmental dose-optimization program  which includes automated exposure control, adjustment of the mA and/or kV according to patient size and/or use of iterative reconstruction technique.  CONTRAST:  OMNIPAQUE IOHEXOL 300 MG/ML  SOLN COMPARISON:  CT abdomen and pelvis dated 08/05/2021. FINDINGS: Lower chest: There is a moderate hiatal hernia. Surgical clips are seen at the gastroesophageal junction. Hepatobiliary: A 1.1 cm cyst is seen in the left hepatic lobe. Gallstones are seen in the gallbladder neck. No gallbladder wall thickening or biliary dilatation. Pancreas: Unremarkable. No pancreatic ductal dilatation or surrounding inflammatory changes. Spleen: Normal in size without focal abnormality. Adrenals/Urinary Tract: Adrenal glands are unremarkable. Kidneys are normal, without renal calculi, focal lesion, or hydronephrosis. There is mild diffuse urinary bladder wall thickening, similar to prior exam. Stomach/Bowel: Stomach is within normal limits. Enteric contrast reaches the splenic flexure. Appendix is not identified and likely absent. There is colonic diverticulosis without evidence of diverticulitis. No evidence of bowel wall thickening, distention, or inflammatory changes. Vascular/Lymphatic: Aortic atherosclerosis. No enlarged abdominal or pelvic lymph nodes. Reproductive: Status post hysterectomy. No adnexal masses. Other: No abdominal wall hernia or abnormality. No abdominopelvic ascites. Musculoskeletal: Degenerative changes are seen in the spine. IMPRESSION: 1. Mild diffuse urinary bladder wall thickening is similar to prior exam. There is a nonspecific but can be seen in the setting of cystitis. 2. Moderate hiatal hernia. 3. Cholelithiasis without evidence of cholecystitis. Aortic Atherosclerosis (ICD10-I70.0). Electronically Signed   By: Romona Curls M.D.   On: 02/05/2023 18:21      Impression /Plan:   89 year old A.A female patient that presented to ER with complaints of bilateral extremity edema/pain and epigastric pain. She states she has had the epigastric pain intermittently for several years but that this episode was the most severe thus far. Cardiac workup negative.  DVT doppler negative. Epi pain not relieved with PPI thewrapy BID and sucralfate. GI consulted.  Epigastric pain, tender to palpation. Cardiac workup thus far negative- Trop neg, CK and TSH WNL, 10/11 echo with EF 60-65%. HX of GERD with Barrett's, HH repair takedown (2020), taking daily Dexilant at home. Compliance? Not relieved with PPI therapy BID or Sucralfate while in hospital. Remote hx of gastroparesis in 2012? Gerd with hx of Barrets, uncontrolled? Cholelithiasis on Ct scan but without typical presentation. Negative pancreatitis. WBC 4.8 Normal LFTs Lipase 47 BUN 19/creat 0.93 - Continue Pantoprazole 40 mg BID -NPO after MN -Upper endoscopy scheduled for tomorrow. I thoroughly discussed the procedure to include nature, alternatives, benefits, and risks including but not limited to bleeding, perforation, infection, anesthesia/cardiac and pulmonary complications. Patient provides understanding and gave verbal consent to proceed. -Hold Eliquis  GERD with history of Barretts and HH.  EGD 9/18 with Barrett's esophagus. On Daily Dexilant. Compliance? Recurrent hiatal hernia on CT scan. HH repair takedown (2020). PH and impedance testing showing significant reflux.Esophageal manometry - mild decrease in distal contractility. Reports daily pyrosis and intermittent dysphagia. - Continue Pantoprazole 40mg  BID  Cholelithiasis without evidence of cholecystitis. Epigastric pain radiates to left and not exacerbated by eating. Negative Murphy sign. Normal Lfts. WBC WNL. Afebrile. WBC 4.8 Normal LFTs   Lipase 47 BUN 19/creat 0.93 - Continue Pantopraz -Continue to monitor  Hx of Atrial fibrillation (not on Eliquis as prescribed outpt) Restarted in hospital, last dose yesterday night. - Will hold Eliquis (ok per hospitalist)  Hypertensive at admission- seen by cardiology, given IV Lasix. Stable.  Concern for volume overload with BLE edema. Given IV Lasix. Edema improved. DVT Doppler negative. Echo  showed normal EF. -  managed by cardiology  Thrombocytopenia. At baseline. Plts 125.  Acute on chronic anemia. Hgb 10.3. At baseline Hx of IDA. Ferritin 9.  H/o TAVR  Principal Problem:   Acute CHF (congestive heart failure) (HCC)    LOS: 4 days   Thank you for your kind consultation, we will continue to follow.   Ioanna Colquhoun J Merlina Marchena  02/07/2023, 10:50 AM

## 2023-02-07 NOTE — Plan of Care (Signed)
  Problem: Education: Goal: Knowledge of General Education information will improve Description: Including pain rating scale, medication(s)/side effects and non-pharmacologic comfort measures Outcome: Progressing   Problem: Clinical Measurements: Goal: Ability to maintain clinical measurements within normal limits will improve Outcome: Progressing   Problem: Pain Managment: Goal: General experience of comfort will improve Outcome: Progressing   Problem: Coping: Goal: Level of anxiety will decrease Outcome: Not Progressing

## 2023-02-07 NOTE — Plan of Care (Signed)
Problem: Clinical Measurements: Goal: Will remain free from infection Outcome: Progressing Goal: Respiratory complications will improve Outcome: Progressing   Problem: Activity: Goal: Risk for activity intolerance will decrease Outcome: Progressing   Problem: Safety: Goal: Ability to remain free from injury will improve Outcome: Progressing   Problem: Skin Integrity: Goal: Risk for impaired skin integrity will decrease Outcome: Progressing

## 2023-02-07 NOTE — Consult Note (Addendum)
Consultation  Primary Care Physician:  Cain Saupe, MD Primary Gastroenterologist:  Dr. Myrtie Neither       Reason for Consultation:  DOA: 02/02/2023         Hospital Day: 6         HPI:   Sherri Mcmillan is a 89 y.o. female with past medical history significant for chronic anxiety, depression, hypertension, sinus bradycardia, CAD, DVT, paroxysmal atrial fibrillation not on Eliquis, status post TAVR 2020, admitted to the hospital on 10/10 for hypertensive urgency and bilateral LE edema.   Work up in ED notable for: NSR. [place] LE doppler-no CFV obstruction in R, no DVT in L.Troponin neg x2, BNP 259, abnormal WBC 3.4, Hgb 10.5, plt 130. albumin wnl. SBP was up to 236/113. She received 5mg  amlodipine, 20mg  IV Lasix, 10 + 5mg  IV hydralazine, Vicodin x 2, and Zofran.   02/05/2023 CT scan shows mild diffuse urinary bladder thickening, moderate HH, and cholelithiasis without evidence of cholecystitis.  02/04/2023 ECHO Left ventricular ejection fraction, by estimation, is 60 to 65%.   Patient last saw Dr. Myrtie Neither on 7//2019 symptoms of heartburn, regurgitation, nausea.  At that time he discussed with her that the recurrent hiatal hernia could be contributing to her severe reflux symptoms.  He advised her to consult with Dr. Daphine Deutscher about revision of the surgery for repeat hiatal hernia repair.  Procedures (see full report below) 01/09/17 EGD for dysphagia and GERD 08/24/2017 PH with Impedence study 08/24/2017 Esophageal Motility study 12/01/2018 Redo HH repair takedown with Dr. Daphine Deutscher.  Patient reports she has had intermittent epigastric pain for several years that can lasts a few minutes up to several days. She reports the severity recently increased 10/10 this past week radiating to her left side. She reports it is not exacerbated with eating. She reports pain medication, heating pad, and drinking water help ease the discomfort. 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     She has  history of GERD and reports daily pyrosis and mild dysphagia intermittently. She reports taking her Dexilant at home daily. Mild nausea with poor appetite. Reports some days she does not eat anything. Last BM was on Thursday 10/10. No blood per rectum. Reports intermittent constipation. Reports SOB when the pain increases. Reports left leg pain that has improved since admission. Former smoker. No alcohol use.    She has received PPI BID and sucralfate in hospital without improvement.  Hx of afib- reports she has not been on [facility] for over a year.  No family was present at the time of my evaluation.   Previous GI workup:   02/05/2023 CT ABDOMEN AND PELVIS WITH CONTRAST  IMPRESSION: 1. Mild diffuse urinary bladder wall thickening is similar to prior exam. There is a nonspecific but can be seen in the setting of cystitis. 2. Moderate hiatal hernia. 3. Cholelithiasis without evidence of cholecystitis.  08/24/2017 Esophageal motility study Esophageal motility study revealed normal relaxation of the EG junction.  No significant esophageal peristolic abnormality detected on the study.  Poor bolus clearance likely secondary to hiatal hernia and reflux  08/24/2017 PH with Impedence study Poor acid suppression with evidence of increased gastroesophageal acid reflux.  01/09/17 EGD with Dr. Myrtie Neither for dysphagia, heartburn  Impression:  - Normal larynx.  - LA Grade B reflux esophagitis.  - Esophageal mucosal changes suspicious for short- segment Barrett' s esophagus. Biopsied.  - 5 cm hiatal hernia.  - A fundoplication was found.  The wrap appears intact.  - Normal examined duodenum.  Diagnosis Surgical [P], distal esophagus - INTESTINAL METAPLASIA (GOBLET CELL METAPLASIA) CONSISTENT WITH BARRETT'S ESOPHAGUS. - REFLUX CHANGES. - NO DYSPLASIA OR MALIGNANCY. JULIA  12/11/2010 NM gastric emptying study showed at , 49% of activity remained within the stomach.  Past Medical History:  Diagnosis  Date   Anemia 12/14/2012   Ataxia 06/24/2016   Auditory hallucinations 08/19/2010   Has has auditory hallucinations, which seem to have worsened since death of her sister 01/14/10. Admission to [facility] (90s or early 2000)  for 6 weeks after mother died, also with hallucinations.  B/C of severe mental illness and refusal to see pysch, I have refused to refill controlled meds. If she decides to pursue mental health, then she needs to take the initiative and sch the appt bc [name] has s   Barrett's esophagus    Demonstrated on EGD 01/15/2011. EGD 09/17/2012 shows inflamed GE junctional mucosa without metaplasia, dysplasia, or malignancy.   Cataract    Chronic anxiety    Admission to [facility] (90s or early 2000)  for 6 weeks after mother died. Complicated again by death of her sister 2010. 2012 developed hallucinations and I refused to refill controlled meds unless she see psych which she is not agreable to   Chronic insomnia    Depression    Diverticulosis    DVT (deep venous thrombosis) (HCC)    Gallstones    Gastroparesis    Demonstrated on GES 2011/01/15 by Dr Dalene Seltzer   GERD (gastroesophageal reflux disease)    describes " I sometimes have a hard time talking or swallowing" - relative to the reflux   Hiatal hernia    HLD (hyperlipidemia) 01/2010   HTN (hypertension)    Controlled with 2 drug therapy   Insomnia 07/27/2006   Left leg DVT (HCC) 09/28/2012   Migraine    Pulmonary nodule 02/10/2014   CT 06/2015 shows stable pulm nodules compared to prior study 2015, so likely benign.      S/P TAVR (transcatheter aortic valve replacement) 05/30/2018   s/p 26 mm Medtronic Evolut Pro Plus via TF approach on 05/30/18   Severe aortic stenosis     Surgical History:  She  has a past surgical history that includes Direct laryngoscopy 01/15/07); Meniscectomy ( July 2002); Abdominal hysterectomy; Hemorrhoid surgery; Appendectomy; Cataract extraction; Polypectomy (2000); Colonoscopy (2000&2005);  Esophagogastroduodenoscopy (11/2010); Knee arthroscopy; Rotator cuff repair (09/21/2011); Esophagogastroduodenoscopy (N/A, 09/17/2012); Esophagogastroduodenoscopy (N/A, 12/17/2012); Laparoscopic Nissen fundoplication (N/A, 05/02/2013); Endovenous ablation saphenous vein w/ laser (Left, 05-09-2014); Excision mass head (N/A, 03/05/2016); Esophageal manometry (N/A, 08/24/2017); 24 hour ph study (N/A, 08/24/2017); Tooth Extraction (Bilateral, 04/05/2018); RIGHT/LEFT HEART CATH AND CORONARY ANGIOGRAPHY (N/A, 04/06/2018); Eye surgery (Bilateral); Cardiac catheterization; Transcatheter aortic valve replacement, transfemoral (N/A, 05/30/2018); TEE without cardioversion (N/A, 05/30/2018); and Hiatal hernia repair (N/A, 12/01/2018). Family History:  Her family history includes Diabetes in her brother; Heart attack in her father and mother; Heart disease in her mother; Hypertension in her father and mother. Social History:   reports that she quit smoking about 39 years ago. Her smoking use included cigarettes. She started smoking about 89 years ago. She has never used smokeless tobacco. She reports that she does not drink alcohol and does not use drugs.  Prior to Admission medications   Medication Sig Start Date End Date Taking? Authorizing Provider  brimonidine (ALPHAGAN) 0.2 % ophthalmic solution Place 1 drop into the left eye 2 (two) times daily. 11/30/22  Yes [provider]  DEXILANT 60 MG capsule TAKE 1 CAPSULE BY MOUTH EVERY DAY Patient taking differently: Take 60 mg by mouth daily. 09/11/18  Yes Danis, Andreas Blower, MD  dorzolamide-timolol (COSOPT) 22.3-6.8 MG/ML ophthalmic solution Place 1 drop into the left eye 2 (two) times daily. 07/17/21  Yes [provider]  gabapentin (NEURONTIN) 100 MG capsule Take 100 mg by mouth 3 (three) times daily.   Yes [provider]  latanoprost (XALATAN) 0.005 % ophthalmic solution Place 1 drop into both eyes at bedtime. 07/17/21  Yes [provider]   QUEtiapine (SEROQUEL) 25 MG tablet Take 25 mg by mouth at bedtime. 12/13/22  Yes [provider]  ELIQUIS 5 MG TABS tablet Take 5 mg by mouth 2 (two) times daily. 02/02/23   [provider]  hydrOXYzine (ATARAX) 25 MG tablet Take 25 mg by mouth 3 (three) times daily. 02/02/23   [provider]  metoprolol succinate (TOPROL-XL) 25 MG 24 hr tablet Take 25 mg by mouth daily. 02/02/23   [provider]    Current Facility-Administered Medications  Medication Dose Route Frequency Provider Last Rate Last Admin   acetaminophen (TYLENOL) tablet 650 mg  650 mg Oral Q6H PRN Kirby Crigler, Mir M, MD   650 mg at 02/07/23 0141   Or   acetaminophen (TYLENOL) suppository 650 mg  650 mg Rectal Q6H PRN Kirby Crigler, Mir M, MD       albuterol (PROVENTIL) (2.5 MG/3ML) 0.083% nebulizer solution 2.5 mg  2.5 mg Nebulization Q2H PRN Kirby Crigler, Mir M, MD       alum & mag hydroxide-simeth (MAALOX/MYLANTA) 200-200-20 MG/5ML suspension 15 mL  15 mL Oral Q6H PRN Osvaldo Shipper, MD   15 mL at 02/05/23 1819   apixaban (ELIQUIS) tablet 2.5 mg  2.5 mg Oral BID Kirby Crigler, Mir M, MD   2.5 mg at 02/06/23 2101   brimonidine (ALPHAGAN) 0.2 % ophthalmic solution 1 drop  1 drop Left Eye BID Kirby Crigler, Mir M, MD   1 drop at 02/07/23 1049   cyanocobalamin (VITAMIN B12) tablet 500 mcg  500 mcg Oral Daily Osvaldo Shipper, MD   500 mcg at 02/07/23 1047   dorzolamide-timolol (COSOPT) 2-0.5 % ophthalmic solution 1 drop  1 drop Left Eye BID Kirby Crigler, Mir M, MD   1 drop at 02/06/23 2101   gabapentin (NEURONTIN) capsule 100 mg  100 mg Oral TID Maryln Gottron, MD   100 mg at 02/07/23 1047   hydrALAZINE (APRESOLINE) injection 5 mg  5 mg Intravenous Q6H PRN Maryln Gottron, MD       HYDROcodone-acetaminophen (NORCO/VICODIN) 5-325 MG per tablet 1 tablet  1 tablet Oral Q6H PRN Osvaldo Shipper, MD   1 tablet at 02/07/23 0759   hydrOXYzine (ATARAX) tablet 25 mg  25 mg Oral TID PRN Maryln Gottron, MD   25 mg  at 02/04/23 2156   latanoprost (XALATAN) 0.005 % ophthalmic solution 1 drop  1 drop Both Eyes QHS Kirby Crigler, Mir M, MD   1 drop at 02/06/23 2101   metoprolol succinate (TOPROL-XL) 24 hr tablet 25 mg  25 mg Oral Daily Osvaldo Shipper, MD   25 mg at 02/06/23 1002   ondansetron (ZOFRAN) tablet 4 mg  4 mg Oral Q6H PRN Kirby Crigler, Mir M, MD       Or   ondansetron [facility]) injection 4 mg  4 mg Intravenous Q6H PRN Kirby Crigler, Mir M, MD   4 mg at 02/05/23 1819   pantoprazole (PROTONIX) EC tablet 40 mg  40 mg Oral BID Osvaldo Shipper, MD   40 mg at 02/07/23 1047   QUEtiapine (SEROQUEL) tablet 25 mg  25 mg Oral QHS Kirby Crigler, Mir M, MD   25 mg at 02/06/23 2101   sucralfate (CARAFATE) 1 GM/10ML suspension 1 g  1 g Oral TID WC & HS Osvaldo Shipper, MD   1 g at 02/06/23 2100   traZODone (DESYREL) tablet 25 mg  25 mg Oral QHS PRN Maryln Gottron, MD        Allergies as of 02/02/2023 - Review Complete 02/02/2023  Allergen Reaction Noted   Aspirin Itching and Rash 07/06/2006    Review of Systems:    Constitutional: No weight loss, fever, chills, weakness or fatigue HEENT: Eyes: No change in vision               Ears, Nose, Throat:  No change in hearing or congestion Skin: No rash or itching Cardiovascular: No chest pain, chest pressure or palpitations   Respiratory: No SOB or cough Gastrointestinal: See HPI and otherwise negative Genitourinary: No dysuria or change in urinary frequency Neurological: No headache, dizziness or syncope Musculoskeletal: No new muscle or joint pain Hematologic: No bleeding or bruising Psychiatric: No history of depression or anxiety     Physical Exam:  Vital signs in last 24 hours: Temp:  [98.1 F (36.7 C)-99.1 F (37.3 C)] 98.4 F (36.9 C) (10/14 0634) Pulse Rate:  [48-67] 56 (10/14 1048) Resp:  [16-18] 18 (10/14 0634) BP: (101-149)/(63-77) 148/72 (10/14 0634) SpO2:  [97 %-100 %] 98 % (10/14 0634) Weight:  [51.2 kg] 51.2 kg (10/14 0500) Last BM Date :  02/04/23 Last BM recorded by nurses in past 5 days No data recorded  General:  Tearful but pleasant petite female expressing she is in constant pain. Head:  Normocephalic and atraumatic. Eyes: sclerae anicteric Heart:  regular rate and rhythm, no murmurs or gallops. Epigastric pain with palpation. Pulm: Clear anteriorly; no wheezing, on RA Abdomen:  Soft, Flat AB, Hypoactive bowel sounds. mild tenderness in the epigastrium with palpation. Without guarding and Without rebound, No organomegaly appreciated. Extremities:  With trace edema. Tenderness to left leg Msk:  Symmetrical without gross deformities. Peripheral pulses intact.  Neurologic:  Alert and  oriented x4;  No focal deficits.  Skin:   Dry and intact without significant lesions or rashes. Psychiatric:  Cooperative. Very tearful.  LAB RESULTS: Recent Labs    02/05/23 0535 02/06/23 0528  WBC 3.5* 4.8  HGB 10.3* 10.3*  HCT 33.2* 33.4*  PLT 131* 125*   BMET Recent Labs    02/05/23 0535 02/06/23 0528  NA 138 137  K 4.2 4.2  CL 104 102  CO2 26 27  GLUCOSE 87 100*  BUN 23 19  CREATININE 0.98 0.93  CALCIUM 9.1 9.2   LFT Recent Labs    02/05/23 0530 02/06/23 0528  PROT 6.4* 6.7  ALBUMIN 3.4* 3.6  AST 17 18  ALT 11 12  ALKPHOS 43 53  BILITOT 0.6 0.6  BILIDIR <0.1  --   IBILI NOT CALCULATED  --    PT/INR No results for input(s): "LABPROT", "INR" in the last 72 hours.  STUDIES: CT ABDOMEN PELVIS W CONTRAST  Result Date: 02/05/2023 CLINICAL DATA:  Abdominal pain. EXAM: CT ABDOMEN AND PELVIS WITH CONTRAST TECHNIQUE: Multidetector CT imaging of the abdomen and pelvis was performed using the standard protocol following bolus administration of intravenous contrast. RADIATION DOSE REDUCTION: This exam was performed according to the departmental dose-optimization program  which includes automated exposure control, adjustment of the mA and/or kV according to patient size and/or use of iterative reconstruction technique.  CONTRAST:  OMNIPAQUE IOHEXOL 300 MG/ML  SOLN COMPARISON:  CT abdomen and pelvis dated 08/05/2021. FINDINGS: Lower chest: There is a moderate hiatal hernia. Surgical clips are seen at the gastroesophageal junction. Hepatobiliary: A 1.1 cm cyst is seen in the left hepatic lobe. Gallstones are seen in the gallbladder neck. No gallbladder wall thickening or biliary dilatation. Pancreas: Unremarkable. No pancreatic ductal dilatation or surrounding inflammatory changes. Spleen: Normal in size without focal abnormality. Adrenals/Urinary Tract: Adrenal glands are unremarkable. Kidneys are normal, without renal calculi, focal lesion, or hydronephrosis. There is mild diffuse urinary bladder wall thickening, similar to prior exam. Stomach/Bowel: Stomach is within normal limits. Enteric contrast reaches the splenic flexure. Appendix is not identified and likely absent. There is colonic diverticulosis without evidence of diverticulitis. No evidence of bowel wall thickening, distention, or inflammatory changes. Vascular/Lymphatic: Aortic atherosclerosis. No enlarged abdominal or pelvic lymph nodes. Reproductive: Status post hysterectomy. No adnexal masses. Other: No abdominal wall hernia or abnormality. No abdominopelvic ascites. Musculoskeletal: Degenerative changes are seen in the spine. IMPRESSION: 1. Mild diffuse urinary bladder wall thickening is similar to prior exam. There is a nonspecific but can be seen in the setting of cystitis. 2. Moderate hiatal hernia. 3. Cholelithiasis without evidence of cholecystitis. Aortic Atherosclerosis (ICD10-I70.0). Electronically Signed   By: Romona Curls M.D.   On: 02/05/2023 18:21      Impression /Plan:   89 year old A.A female patient that presented to ER with complaints of bilateral extremity edema/pain and epigastric pain. She states she has had the epigastric pain intermittently for several years but that this episode was the most severe thus far. Cardiac workup negative.  DVT doppler negative. Epi pain not relieved with PPI thewrapy BID and sucralfate. GI consulted.  Epigastric pain, tender to palpation. Cardiac workup thus far negative- Trop neg, CK and TSH WNL, 10/11 echo with EF 60-65%. HX of GERD with Barrett's, HH repair takedown (2020), taking daily Dexilant at home. Compliance? Not relieved with PPI therapy BID or Sucralfate while in hospital. Remote hx of gastroparesis in 2012? Gerd with hx of Barrets, uncontrolled? Cholelithiasis on Ct scan but without typical presentation. Negative pancreatitis. WBC 4.8 Normal LFTs Lipase 47 BUN 19/creat 0.93 - Continue Pantoprazole 40 mg BID -NPO after MN -Upper endoscopy scheduled for tomorrow. I thoroughly discussed the procedure to include nature, alternatives, benefits, and risks including but not limited to bleeding, perforation, infection, anesthesia/cardiac and pulmonary complications. Patient provides understanding and gave verbal consent to proceed. -Hold Eliquis  GERD with history of Barretts and HH.  EGD 9/18 with Barrett's esophagus. On Daily Dexilant. Compliance? Recurrent hiatal hernia on CT scan. HH repair takedown (2020). PH and impedance testing showing significant reflux.Esophageal manometry - mild decrease in distal contractility. Reports daily pyrosis and intermittent dysphagia. - Continue Pantoprazole 40mg  BID  Cholelithiasis without evidence of cholecystitis. Epigastric pain radiates to left and not exacerbated by eating. Negative Murphy sign. Normal Lfts. WBC WNL. Afebrile. WBC 4.8 Normal LFTs   Lipase 47 BUN 19/creat 0.93 - Continue Pantopraz -Continue to monitor  Hx of Atrial fibrillation (not on Eliquis as prescribed outpt) Restarted in hospital, last dose yesterday night. - Will hold Eliquis (ok per hospitalist)  Hypertensive at admission- seen by cardiology, given IV Lasix. Stable.  Concern for volume overload with BLE edema. Given IV Lasix. Edema improved. DVT Doppler negative. Echo  showed normal EF. -  managed by cardiology  Thrombocytopenia. At baseline. Plts 125.  Acute on chronic anemia. Hgb 10.3. At baseline Hx of IDA. Ferritin 9.  H/o TAVR  Principal Problem:   Acute CHF (congestive heart failure) (HCC)    LOS: 4 days   Thank you for your kind consultation, we will continue to follow.   Ioanna Colquhoun J Merlina Marchena  02/07/2023, 10:50 AM

## 2023-02-07 NOTE — Progress Notes (Signed)
Agree with previous nurse shift assessment and care.Will resume the care.

## 2023-02-07 NOTE — Progress Notes (Signed)
Mobility Specialist - Progress Note   02/07/23 0912  Mobility  Activity Ambulated with assistance in hallway  Level of Assistance Contact guard assist, steadying assist  Assistive Device Other (Comment) (Hallway Rail, HHA)  Distance Ambulated (ft) 100 ft  Range of Motion/Exercises Active Assistive  Activity Response Tolerated well  Mobility Referral Yes  $Mobility charge 1 Mobility  Mobility Specialist Start Time (ACUTE ONLY) 0840  Mobility Specialist Stop Time (ACUTE ONLY) 0911  Mobility Specialist Time Calculation (min) (ACUTE ONLY) 31 min   Pt was found in bed and agreeable to ambulate after bathroom use. C/o L-sided pain stating it radiates from her upper abdomin to her side. Pt at EOS returned to recliner chair with all needs met. Call bell in reach.   Billey Chang Mobility Specialist

## 2023-02-07 NOTE — Progress Notes (Signed)
[facility] PROGRESS NOTE   CADDIE TORRENCE BJY:[id number] DOB: 11-16-1932 DOA: 02/02/2023  PCP: Cain Saupe, MD  Brief History: 89 y.o. female with medical history significant for chronic anxiety, depression, hypertension, sinus bradycardia, CAD, DVT, paroxysmal atrial fibrillation not on Eliquis, status post TAVR 2020 being admitted to the hospital with hypertensive urgency and concern for new onset heart failure.    Consultants: Cardiology  Procedures: Echocardiogram     Subjective/Interval History: Patient continues to have upper abdominal pain and left-sided flank pain.  According to nursing staff she was in a lot of pain over the course of the night.  No nausea vomiting has been noted.    Assessment/Plan:  Upper abdominal pain Continues to have upper abdominal discomfort.  Lipase level was mildly elevated initially but then became normal subsequently.  CT of the abdomen pelvis did not show any evidence for pancreatitis.  Gallstones were noted but LFTs are normal. She was started on PPI and Carafate.    No evidence for GI bleed.  Of note she last had upper endoscopy in 2018 which showed esophagitis and dysmotility.  CT scan did show moderate hiatal hernia.  It looks like in 2020 she was experiencing dysphagia.  She had a takedown of her Nissen fundoplication and of the hiatal hernia at that time. So it is quite likely that her symptoms are due to the hiatal hernia and esophagitis.  Despite twice daily PPI and Carafate symptoms have not improved.  Does not really mention dysphagia at this time around.  UA has been unremarkable Will request gastroenterology to evaluate her.    Accelerated hypertension Came in with significantly elevated blood pressure at admission.  She was seen by cardiology.  She was given IV furosemide. Improvement in blood pressure noted. She was on amlodipine prior to admission which has been discontinued to pedal edema. Metoprolol was resumed.   Blood pressures are stable for the most part.  Cannot go up on the dose of metoprolol since she is bradycardic.   Elevated blood pressure readings likely secondary to pain issues.  Monitor for now.     Concern for volume overload She had lower extremity edema and was given furosemide.  She was seen by cardiology.  She is diuresed well.  Edema is improved.   Lower extremity Doppler studies were negative for DVT. Echocardiogram showed normal systolic function.  The aortic valve did not show any acute findings.  TSH is normal. No indication to continue furosemide at this time. Etiology of leg pain is not clear.  Has good pulses.  No obvious lesions identified.  Monitor for now.  Paroxysmal atrial fibrillation Seen by cardiology and started on apixaban.  On metoprolol.  Stable.  History of glaucoma Continue with eyedrops.  Hypokalemia Supplemented  Pancytopenia/iron deficiency Stable.  Anemia panel reviewed.  Ferritin is 9, iron 56, TIBC 391, percent saturation 14.  Folic acid is 9.8.  Vitamin B12 234.  She was started on B12 supplements.   Will benefit from iron supplements at discharge.  Obesity Estimated body mass index is 34.44 kg/m as calculated from the following:   Height as of this encounter: 4' (1.219 m).   Weight as of this encounter: 51.2 kg.   DVT Prophylaxis: On apixaban Code Status: Full code Family Communication: Discussed with patient Disposition Plan: Continue to mobilize.  Await improvement in her abdominal symptoms.  Status is: Inpatient Remains inpatient appropriate because: Accelerated hypertension, volume overload      Medications: Scheduled:  apixaban  2.5 mg Oral BID   brimonidine  1 drop Left Eye BID   vitamin B-12  500 mcg Oral Daily   dorzolamide-timolol  1 drop Left Eye BID   gabapentin  100 mg Oral TID   latanoprost  1 drop Both Eyes QHS   metoprolol succinate  25 mg Oral Daily   pantoprazole  40 mg Oral BID   QUEtiapine  25 mg Oral QHS    sucralfate  1 g Oral TID WC & HS   Continuous: UXL:[id number] **OR** acetaminophen, albuterol, alum & mag hydroxide-simeth, hydrALAZINE, HYDROcodone-acetaminophen, hydrOXYzine, ondansetron **OR** ondansetron (ZOFRAN) IV, traZODone  Antibiotics: Anti-infectives (From admission, onward)    None       Objective:  Vital Signs  Vitals:   02/06/23 1219 02/06/23 2202 02/07/23 0500 02/07/23 0634  BP: 101/63 (!) 149/77  (!) 148/72  Pulse: (!) 52 67  (!) 48  Resp: 16 18  18   Temp: 98.1 F (36.7 C) 99.1 F (37.3 C)  98.4 F (36.9 C)  TempSrc: Oral Oral  Oral  SpO2: 97% 100%  98%  Weight:   51.2 kg   Height:        Intake/Output Summary (Last 24 hours) at 02/07/2023 0909 Last data filed at 02/07/2023 0140 Gross per 24 hour  Intake 477 ml  Output 350 ml  Net 127 ml   Filed Weights   02/05/23 0500 02/06/23 0424 02/07/23 0500  Weight: 48.1 kg 51.8 kg 51.2 kg    General appearance: Awake alert.  In no distress Resp: Clear to auscultation bilaterally.  Normal effort Cardio: S1-S2 is normal regular.  No S3-S4.  No rubs murmurs or bruit GI: Abdomen is soft.  Tender in the epigastric area and in the left upper quadrant.  No rebound rigidity or guarding.  No masses organomegaly.   Extremities: No edema.  No swelling erythema.  Good pedal pulses. Neurologic: Alert and oriented x3.  No focal neurological deficits.    Lab Results:  Data Reviewed: I have personally reviewed following labs and reports of the imaging studies  CBC: Recent Labs  Lab 02/02/23 1923 02/04/23 0519 02/05/23 0535 02/06/23 0528  WBC 3.4* 3.4* 3.5* 4.8  NEUTROABS 1.7  --   --   --   HGB 10.5* 10.1* 10.3* 10.3*  HCT 33.9* 32.6* 33.2* 33.4*  MCV 88.5 88.3 89.0 88.6  PLT 130* 118* 131* 125*    Basic Metabolic Panel: Recent Labs  Lab 02/02/23 1923 02/04/23 0519 02/05/23 0535 02/06/23 0528  NA 137 137 138 137  K 3.7 3.2* 4.2 4.2  CL 104 103 104 102  CO2 25 23 26 27   GLUCOSE 90 88 87 100*   BUN 18 19 23 19   CREATININE 0.83 1.12* 0.98 0.93  CALCIUM 8.9 8.9 9.1 9.2    GFR: Estimated Creatinine Clearance: 20.6 mL/min (by C-G formula based on SCr of 0.93 mg/dL).  Liver Function Tests: Recent Labs  Lab 02/02/23 1923 02/05/23 0530 02/06/23 0528  AST 22 17 18   ALT 13 11 12   ALKPHOS 67 43 53  BILITOT 0.8 0.6 0.6  PROT 7.8 6.4* 6.7  ALBUMIN 4.4 3.4* 3.6     Cardiac Enzymes: Recent Labs  Lab 02/03/23 0614  CKTOTAL 106     CBG: Recent Labs  Lab 02/03/23 1101 02/04/23 1649  GLUCAP 85 111*    Radiology Studies: CT ABDOMEN PELVIS W CONTRAST  Result Date: 02/05/2023 CLINICAL DATA:  Abdominal pain. EXAM: CT ABDOMEN AND PELVIS WITH CONTRAST TECHNIQUE: Multidetector CT  imaging of the abdomen and pelvis was performed using the standard protocol following bolus administration of intravenous contrast. RADIATION DOSE REDUCTION: This exam was performed according to the departmental dose-optimization program which includes automated exposure control, adjustment of the mA and/or kV according to patient size and/or use of iterative reconstruction technique. CONTRAST:  OMNIPAQUE IOHEXOL 300 MG/ML  SOLN COMPARISON:  CT abdomen and pelvis dated 08/05/2021. FINDINGS: Lower chest: There is a moderate hiatal hernia. Surgical clips are seen at the gastroesophageal junction. Hepatobiliary: A 1.1 cm cyst is seen in the left hepatic lobe. Gallstones are seen in the gallbladder neck. No gallbladder wall thickening or biliary dilatation. Pancreas: Unremarkable. No pancreatic ductal dilatation or surrounding inflammatory changes. Spleen: Normal in size without focal abnormality. Adrenals/Urinary Tract: Adrenal glands are unremarkable. Kidneys are normal, without renal calculi, focal lesion, or hydronephrosis. There is mild diffuse urinary bladder wall thickening, similar to prior exam. Stomach/Bowel: Stomach is within normal limits. Enteric contrast reaches the splenic flexure. Appendix is  not identified and likely absent. There is colonic diverticulosis without evidence of diverticulitis. No evidence of bowel wall thickening, distention, or inflammatory changes. Vascular/Lymphatic: Aortic atherosclerosis. No enlarged abdominal or pelvic lymph nodes. Reproductive: Status post hysterectomy. No adnexal masses. Other: No abdominal wall hernia or abnormality. No abdominopelvic ascites. Musculoskeletal: Degenerative changes are seen in the spine. IMPRESSION: 1. Mild diffuse urinary bladder wall thickening is similar to prior exam. There is a nonspecific but can be seen in the setting of cystitis. 2. Moderate hiatal hernia. 3. Cholelithiasis without evidence of cholecystitis. Aortic Atherosclerosis (ICD10-I70.0). Electronically Signed   By: Romona Curls M.D.   On: 02/05/2023 18:21       LOS: 4 days   Chong January Rito Ehrlich  [facility] Pager on [email or web address]  02/07/2023, 9:09 AM

## 2023-02-08 ENCOUNTER — Encounter (HOSPITAL_COMMUNITY)
Admission: EM | Disposition: A | Discharge status: Home Health | Payer: Self-pay | Source: Home / Self Care | Attending: Internal Medicine

## 2023-02-08 ENCOUNTER — Inpatient Hospital Stay (HOSPITAL_COMMUNITY): Payer: 59 | Admitting: Anesthesiology

## 2023-02-08 ENCOUNTER — Encounter (HOSPITAL_COMMUNITY): Payer: Self-pay | Admitting: Internal Medicine

## 2023-02-08 DIAGNOSIS — G8929 Other chronic pain: Secondary | ICD-10-CM | POA: Diagnosis not present

## 2023-02-08 DIAGNOSIS — I480 Paroxysmal atrial fibrillation: Secondary | ICD-10-CM | POA: Diagnosis not present

## 2023-02-08 DIAGNOSIS — R1013 Epigastric pain: Secondary | ICD-10-CM | POA: Diagnosis not present

## 2023-02-08 DIAGNOSIS — I10 Essential (primary) hypertension: Secondary | ICD-10-CM | POA: Diagnosis not present

## 2023-02-08 LAB — CBC
HCT: 33.2 % — ABNORMAL LOW (ref 36.0–46.0)
Hemoglobin: 10.3 g/dL — ABNORMAL LOW (ref 12.0–15.0)
MCH: 27.5 pg (ref 26.0–34.0)
MCHC: 31 g/dL (ref 30.0–36.0)
MCV: 88.8 fL (ref 80.0–100.0)
Platelets: 119 10*3/uL — ABNORMAL LOW (ref 150–400)
RBC: 3.74 MIL/uL — ABNORMAL LOW (ref 3.87–5.11)
RDW: 13.8 % (ref 11.5–15.5)
WBC: 3.3 10*3/uL — ABNORMAL LOW (ref 4.0–10.5)
nRBC: 0 % (ref 0.0–0.2)

## 2023-02-08 LAB — COMPREHENSIVE METABOLIC PANEL
ALT: 10 U/L (ref 0–44)
AST: 15 U/L (ref 15–41)
Albumin: 3.7 g/dL (ref 3.5–5.0)
Alkaline Phosphatase: 56 U/L (ref 38–126)
Anion gap: 8 (ref 5–15)
BUN: 25 mg/dL — ABNORMAL HIGH (ref 8–23)
CO2: 26 mmol/L (ref 22–32)
Calcium: 9.2 mg/dL (ref 8.9–10.3)
Chloride: 102 mmol/L (ref 98–111)
Creatinine, Ser: 0.95 mg/dL (ref 0.44–1.00)
GFR, Estimated: 58 mL/min — ABNORMAL LOW (ref >60)
Glucose, Bld: 88 mg/dL (ref 70–99)
Potassium: 4.2 mmol/L (ref 3.5–5.1)
Sodium: 136 mmol/L (ref 135–145)
Total Bilirubin: 0.4 mg/dL (ref 0.3–1.2)
Total Protein: 7.2 g/dL (ref 6.5–8.1)

## 2023-02-08 LAB — PROTIME-INR
INR: 1.1 (ref 0.8–1.2)
Prothrombin Time: 14 s (ref 11.4–15.2)

## 2023-02-08 SURGERY — CANCELLED PROCEDURE
Anesthesia: Monitor Anesthesia Care

## 2023-02-08 MED ORDER — PROPOFOL 10 MG/ML IV BOLUS
INTRAVENOUS | Status: AC
  Filled 2023-02-08: qty 20

## 2023-02-08 MED ORDER — POLYETHYLENE GLYCOL 3350 17 G PO PACK
17.0000 g | PACK | Freq: Every day | ORAL | Status: DC
  Administered 2023-02-08 – 2023-02-11 (×2): 17 g via ORAL
  Filled 2023-02-08 (×2): qty 1

## 2023-02-08 MED ORDER — SODIUM CHLORIDE 0.9 % IV SOLN: INTRAVENOUS | Status: DC

## 2023-02-08 MED ORDER — SENNOSIDES-DOCUSATE SODIUM 8.6-50 MG PO TABS
2.0000 | ORAL_TABLET | Freq: Every day | ORAL | Status: DC
  Administered 2023-02-08 – 2023-02-10 (×3): 2 via ORAL
  Filled 2023-02-08 (×3): qty 2

## 2023-02-08 SURGICAL SUPPLY — 15 items

## 2023-02-08 NOTE — Interval H&P Note (Signed)
History and Physical Interval Note:  02/08/2023 11:13 AM  Sherri Mcmillan  has presented today for surgery, with the diagnosis of epigastric pain.  The various methods of treatment have been discussed with the patient and family. After consideration of risks, benefits and other options for treatment, the patient has consented to  Procedure(s): ESOPHAGOGASTRODUODENOSCOPY (EGD) WITH PROPOFOL (N/A) as a surgical intervention.  The patient's history has been reviewed, patient examined, no change in status, stable for surgery.  I have reviewed the patient's chart and labs.  Questions were answered to the patient's satisfaction.     [employer]

## 2023-02-08 NOTE — Progress Notes (Signed)
[facility] PROGRESS NOTE   Sherri Mcmillan:[id number] DOB: 1932-07-11 DOA: 02/02/2023  PCP: Cain Saupe, MD  Brief History: 89 y.o. female with medical history significant for chronic anxiety, depression, hypertension, sinus bradycardia, CAD, DVT, paroxysmal atrial fibrillation not on Eliquis, status post TAVR 2020 being admitted to the hospital with hypertensive urgency and concern for new onset heart failure.    Consultants: Cardiology  Procedures: Echocardiogram     Subjective/Interval History: Patient feels about the same as yesterday.  No new symptoms.  Did feel nauseated yesterday but did not have any vomiting.  Abdominal pain about the same.     Assessment/Plan:  Upper abdominal pain Continues to have upper abdominal discomfort.  Lipase level was mildly elevated initially but then became normal subsequently.  CT of the abdomen pelvis did not show any evidence for pancreatitis.  Gallstones were noted but LFTs are normal. She was started on PPI and Carafate.    No evidence for GI bleed.  Of note she last had upper endoscopy in 2018 which showed esophagitis and dysmotility.  CT scan did show moderate hiatal hernia.  It looks like in 2020 she was experiencing dysphagia.  She had a takedown of her Nissen fundoplication and of the hiatal hernia at that time. So it is quite likely that her symptoms are due to the hiatal hernia and esophagitis.  Despite twice daily PPI and Carafate symptoms have not improved.  Does not really mention dysphagia at this time around.  UA has been unremarkable Gastroenterology was subsequently consulted.  Plan is for upper endoscopy today. No improvement in symptoms reported by patient.  Continue PPI and Carafate.  Accelerated hypertension Came in with significantly elevated blood pressure at admission.  She was seen by cardiology.  She was given IV furosemide. Improvement in blood pressure noted. She was on amlodipine prior to admission  which has been discontinued to pedal edema. Metoprolol was resumed.  Blood pressures are stable for the most part.  Cannot go up on the dose of metoprolol since she is bradycardic.   Elevated blood pressure readings likely secondary to pain issues.  Monitor for now.     Concern for volume overload She had lower extremity edema and was given furosemide.  She was seen by cardiology.  She is diuresed well.  Edema is improved.   Lower extremity Doppler studies were negative for DVT. Echocardiogram showed normal systolic function.  The aortic valve did not show any acute findings.  TSH is normal. No indication to continue furosemide at this time. Etiology of leg pain is not clear.  Has good pulses.  No obvious lesions identified.  Monitor for now. Cardiology has signed off.  Paroxysmal atrial fibrillation Seen by cardiology and started on apixaban.  On metoprolol.  Stable. Apixaban placed on hold today for upper endoscopy.  History of glaucoma Continue with eyedrops.  Hypokalemia Supplemented  Pancytopenia/iron deficiency Stable.  Anemia panel reviewed.  Ferritin is 9, iron 56, TIBC 391, percent saturation 14.  Folic acid is 9.8.  Vitamin B12 234.  She was started on B12 supplements.   Will benefit from iron supplements at discharge.  Obesity Estimated body mass index is 34.38 kg/m as calculated from the following:   Height as of this encounter: 4' (1.219 m).   Weight as of this encounter: 51.1 kg.   DVT Prophylaxis: On apixaban Code Status: Full code Family Communication: Discussed with patient Disposition Plan: Continue to mobilize.  Await improvement in her abdominal symptoms.  Status is: Inpatient Remains inpatient appropriate because: Accelerated hypertension, volume overload      Medications: Scheduled:  [START ON 02/09/2023] apixaban  2.5 mg Oral BID   brimonidine  1 drop Left Eye BID   vitamin B-12  500 mcg Oral Daily   dorzolamide-timolol  1 drop Left Eye BID    gabapentin  100 mg Oral TID   latanoprost  1 drop Both Eyes QHS   metoprolol succinate  25 mg Oral Daily   pantoprazole  40 mg Oral BID   QUEtiapine  25 mg Oral QHS   sucralfate  1 g Oral TID WC & HS   Continuous: JYN:[id number] **OR** acetaminophen, albuterol, alum & mag hydroxide-simeth, hydrALAZINE, HYDROcodone-acetaminophen, hydrOXYzine, ondansetron **OR** ondansetron (ZOFRAN) IV, traZODone  Antibiotics: Anti-infectives (From admission, onward)    None       Objective:  Vital Signs  Vitals:   02/07/23 1202 02/07/23 2125 02/08/23 0434 02/08/23 0500  BP: (!) 157/73 (!) 155/79 131/65   Pulse: 60 63 (!) 49   Resp: 18  15   Temp: 98.1 F (36.7 C) 97.9 F (36.6 C) 98.5 F (36.9 C)   TempSrc: Oral Oral Oral   SpO2: 100% 97% 99%   Weight:    51.1 kg  Height:        Intake/Output Summary (Last 24 hours) at 02/08/2023 0920 Last data filed at 02/08/2023 0700 Gross per 24 hour  Intake 720 ml  Output --  Net 720 ml   Filed Weights   02/06/23 0424 02/07/23 0500 02/08/23 0500  Weight: 51.8 kg 51.2 kg 51.1 kg    General appearance: Awake alert.  In no distress Resp: Clear to auscultation bilaterally.  Normal effort Cardio: S1-S2 is normal regular.  No S3-S4.  No rubs murmurs or bruit GI: Abdomen is soft.  Remains tender in the upper abdomen without any rebound rigidity or guarding. No obvious focal neurological deficits.   Lab Results:  Data Reviewed: I have personally reviewed following labs and reports of the imaging studies  CBC: Recent Labs  Lab 02/02/23 1923 02/04/23 0519 02/05/23 0535 02/06/23 0528 02/08/23 0521  WBC 3.4* 3.4* 3.5* 4.8 3.3*  NEUTROABS 1.7  --   --   --   --   HGB 10.5* 10.1* 10.3* 10.3* 10.3*  HCT 33.9* 32.6* 33.2* 33.4* 33.2*  MCV 88.5 88.3 89.0 88.6 88.8  PLT 130* 118* 131* 125* 119*    Basic Metabolic Panel: Recent Labs  Lab 02/02/23 1923 02/04/23 0519 02/05/23 0535 02/06/23 0528 02/08/23 0521  NA 137 137 138 137 136   K 3.7 3.2* 4.2 4.2 4.2  CL 104 103 104 102 102  CO2 25 23 26 27 26   GLUCOSE 90 88 87 100* 88  BUN 18 19 23 19  25*  CREATININE 0.83 1.12* 0.98 0.93 0.95  CALCIUM 8.9 8.9 9.1 9.2 9.2    GFR: Estimated Creatinine Clearance: 20.2 mL/min (by C-G formula based on SCr of 0.95 mg/dL).  Liver Function Tests: Recent Labs  Lab 02/02/23 1923 02/05/23 0530 02/06/23 0528 02/08/23 0521  AST 22 17 18 15   ALT 13 11 12 10   ALKPHOS 67 43 53 56  BILITOT 0.8 0.6 0.6 0.4  PROT 7.8 6.4* 6.7 7.2  ALBUMIN 4.4 3.4* 3.6 3.7     Cardiac Enzymes: Recent Labs  Lab 02/03/23 0614  CKTOTAL 106     CBG: Recent Labs  Lab 02/03/23 1101 02/04/23 1649  GLUCAP 85 111*    Radiology Studies: No results  found.     LOS: 5 days   Bexton Haak [employer] on [email or web address]  02/08/2023, 9:20 AM

## 2023-02-08 NOTE — Anesthesia Preprocedure Evaluation (Addendum)
Anesthesia Evaluation  Patient identified by MRN, date of birth, ID band Patient awake    Reviewed: Allergy & Precautions, H&P , NPO status , Patient's Chart, lab work & pertinent test results  Airway Mallampati: II  TM Distance: >3 FB Neck ROM: Full    Dental no notable dental hx.    Pulmonary former smoker   Pulmonary exam normal breath sounds clear to auscultation       Cardiovascular hypertension, Pt. on medications  Rhythm:Regular Rate:Normal + Systolic murmurs S/P TAVR  1. Left ventricular ejection fraction, by estimation, is 60 to 65%. The  left ventricle has normal function. The left ventricle has no regional  wall motion abnormalities. Left ventricular diastolic parameters are  indeterminate. Elevated left atrial  pressure. The E/e' is 22.   2. Right ventricular systolic function is normal. The right ventricular  size is normal. There is normal pulmonary artery systolic pressure. The  estimated right ventricular systolic pressure is 24.0 mmHg.   3. The mitral valve is normal in structure. Trivial mitral valve  regurgitation. No evidence of mitral stenosis.   4. 26 mm Medtronic Evolut PRO TAVR valve well-seated in aortic position,  no valvular stenosis or regurgitation, or paravalvular leak. . Procedure  Date: 05/30/2018.     Neuro/Psych negative neurological ROS  negative psych ROS   GI/Hepatic Neg liver ROS,GERD  ,,  Endo/Other  negative endocrine ROS    Renal/GU negative Renal ROS  negative genitourinary   Musculoskeletal negative musculoskeletal ROS (+)    Abdominal   Peds negative pediatric ROS (+)  Hematology  (+) Blood dyscrasia, anemia   Anesthesia Other Findings   Reproductive/Obstetrics negative OB ROS                              Anesthesia Physical Anesthesia Plan  ASA: 3  Anesthesia Plan: MAC   Post-op Pain Management: Minimal or no pain anticipated    Induction: Intravenous  PONV Risk Score and Plan: 2 and Propofol infusion and Treatment may vary due to age or medical condition  Airway Management Planned: Simple Face Mask  Additional Equipment:   Intra-op Plan:   Post-operative Plan:   Informed Consent: I have reviewed the patients History and Physical, chart, labs and discussed the procedure including the risks, benefits and alternatives for the proposed anesthesia with the patient or authorized representative who has indicated his/her understanding and acceptance.     Dental advisory given  Plan Discussed with: CRNA and Surgeon  Anesthesia Plan Comments:          Anesthesia Quick Evaluation

## 2023-02-08 NOTE — Progress Notes (Signed)
Mobility Specialist - Progress Note   02/08/23 1342  Mobility  Activity Ambulated with assistance in hallway  Level of Assistance Contact guard assist, steadying assist  Assistive Device Front wheel walker  Distance Ambulated (ft) 100 ft  Range of Motion/Exercises Active Assistive  Activity Response Tolerated well  Mobility Referral Yes  $Mobility charge 1 Mobility  Mobility Specialist Start Time (ACUTE ONLY) 1320  Mobility Specialist Stop Time (ACUTE ONLY) 1342  Mobility Specialist Time Calculation (min) (ACUTE ONLY) 22 min   Pt was found in bed and agreeable to ambulate. No complaints with session. At EOS returned to bed with all needs met. Call bell in reach and bed alarm on.  Billey Chang Mobility Specialist

## 2023-02-08 NOTE — Progress Notes (Signed)
Gastroenterology Inpatient Follow-up Note   PATIENT IDENTIFICATION  Sherri Mcmillan is a 89 y.o. female Hospital Day: 7  SUBJECTIVE  The patient was seen and evaluated for endoscopy today in the endoscopy unit. She was describing 9 out of 10 upper abdominal pain throughout the abdomen. The patient denies fevers or chills. She is ready to feel better and have a better understanding of why she has that her chronic abdominal pain. She is willing to move forward with her endoscopy.   OBJECTIVE  Scheduled Inpatient Medications:   [MAR Hold] apixaban  2.5 mg Oral BID   [MAR Hold] brimonidine  1 drop Left Eye BID   [MAR Hold] vitamin B-12  500 mcg Oral Daily   [MAR Hold] dorzolamide-timolol  1 drop Left Eye BID   [MAR Hold] gabapentin  100 mg Oral TID   [MAR Hold] latanoprost  1 drop Both Eyes QHS   [MAR Hold] metoprolol succinate  25 mg Oral Daily   [MAR Hold] pantoprazole  40 mg Oral BID   [MAR Hold] polyethylene glycol  17 g Oral Daily   [MAR Hold] QUEtiapine  25 mg Oral QHS   [MAR Hold] senna-docusate  2 tablet Oral QHS   [MAR Hold] sucralfate  1 g Oral TID WC & HS   Continuous Inpatient Infusions:   sodium chloride     PRN Inpatient Medications: [MAR Hold] acetaminophen **OR** [MAR Hold] acetaminophen, [MAR Hold] albuterol, [MAR Hold] alum & mag hydroxide-simeth, [MAR Hold] hydrALAZINE, [MAR Hold] HYDROcodone-acetaminophen, [MAR Hold] hydrOXYzine, [MAR Hold] ondansetron **OR** [MAR Hold] ondansetron (ZOFRAN) IV, [MAR Hold] traZODone   Physical Examination  Temp:  [97.7 F (36.5 C)-98.5 F (36.9 C)] 97.7 F (36.5 C) (10/15 1037) Pulse Rate:  [49-63] 56 (10/15 1037) Resp:  [15-16] 16 (10/15 1037) BP: (131-173)/(64-79) 173/64 (10/15 1037) SpO2:  [94 %-99 %] 94 % (10/15 1037) Weight:  [51.1 kg] 51.1 kg (10/15 1037) Temp (24hrs), Avg:98 F (36.7 C), Min:97.7 F (36.5 C), Max:98.5 F (36.9 C)  Weight: 51.1 kg GEN: NAD, appears stated age, is chronically ill but is  nontoxic PSYCH: Cooperative, without pressured speech EYE: Conjunctivae pink, sclerae anicteric ENT: MMM CV: Nontachycardic RESP: No audible wheezing GI: NABS, soft, TTP in upper abdomen (9 out of 10 pain) upon even slight palpation  MSK/EXT: Bilateral pedal edema present SKIN: No jaundice NEURO:  Alert & Oriented x 3, no focal deficits   Review of Data   Laboratory Studies   Recent Labs  Lab 02/08/23 0521  NA 136  K 4.2  CL 102  CO2 26  BUN 25*  CREATININE 0.95  GLUCOSE 88  CALCIUM 9.2   Recent Labs  Lab 02/08/23 0521  AST 15  ALT 10  ALKPHOS 56    Recent Labs  Lab 02/05/23 0535 02/06/23 0528 02/08/23 0521  WBC 3.5* 4.8 3.3*  HGB 10.3* 10.3* 10.3*  HCT 33.2* 33.4* 33.2*  PLT 131* 125* 119*   Recent Labs  Lab 02/08/23 0910  INR 1.1   Imaging Studies  No results found.  GI Procedures and Studies  No new relevant studies to review   ASSESSMENT  Sherri Mcmillan is a 89 y.o. female with a pmh significant for CAD, hypertension, prior VTE, A-fib, status post TAVR, anxiety, MDD, chronic abdominal pain.  Patient admitted with hypertensive urgency and persisting abdominal pain of unclear etiology.  Patient was to undergo upper endoscopy today as planned yesterday.  Unfortunately she does not have adequate access for sedation.  Multiple attempts  were performed to try to gain IV access but were not successful.  She does not want to have further sticks today.  We will have to reschedule her for anesthesia availability.  Most likely this will occur on Thursday, but if we have cancellation or availability will try to do her on Wednesday.  The risks and benefits of endoscopic evaluation were discussed with the patient; these include but are not limited to the risk of perforation, infection, bleeding, missed lesions, lack of diagnosis, severe illness requiring hospitalization, as well as anesthesia and sedation related illnesses.  The patient and/or family is agreeable to  proceed.     PLAN/RECOMMENDATIONS  We will attempt EGD more likely on Thursday but if we have a cancellation we will try to do it on Wednesday Please consult IV team and get an IV in place for the patient Not clear to me that this patient's issues are gallbladder related but may need to consider a CCK HIDA Patient aware that we are unlikely to solve all of her chronic abdominal discomfort/pain but will try to rule out significant issues in the interim   Please page/call with questions or concerns.   Corliss Parish, MD [facility] Office # [phone]    LOS: 5 days  Lemar Lofty  02/08/2023, 12:10 PM

## 2023-02-08 NOTE — Progress Notes (Signed)
Pt refused to be on NPO tonight. Explained why she needs to be NPO tonight just in case there will be available schedule for her tomorrow. She stated she prefers to do the EGD on Thursday as planned and have a rest tomorrow. Pt has no IV access currently. She refused IV insertion earlier when IV nurse came and said she wants to do it closer to procedure time. ON call Randye Lobo, NP informed. Charge Nurse Erie Noe, RN made aware.

## 2023-02-09 DIAGNOSIS — K449 Diaphragmatic hernia without obstruction or gangrene: Secondary | ICD-10-CM | POA: Diagnosis not present

## 2023-02-09 DIAGNOSIS — D509 Iron deficiency anemia, unspecified: Secondary | ICD-10-CM | POA: Diagnosis not present

## 2023-02-09 DIAGNOSIS — F419 Anxiety disorder, unspecified: Secondary | ICD-10-CM | POA: Diagnosis not present

## 2023-02-09 DIAGNOSIS — I160 Hypertensive urgency: Secondary | ICD-10-CM | POA: Diagnosis not present

## 2023-02-09 DIAGNOSIS — R1011 Right upper quadrant pain: Secondary | ICD-10-CM

## 2023-02-09 DIAGNOSIS — R1012 Left upper quadrant pain: Secondary | ICD-10-CM

## 2023-02-09 DIAGNOSIS — K219 Gastro-esophageal reflux disease without esophagitis: Secondary | ICD-10-CM

## 2023-02-09 DIAGNOSIS — G8929 Other chronic pain: Secondary | ICD-10-CM | POA: Diagnosis not present

## 2023-02-09 DIAGNOSIS — R1013 Epigastric pain: Secondary | ICD-10-CM | POA: Diagnosis not present

## 2023-02-09 DIAGNOSIS — F32A Depression, unspecified: Secondary | ICD-10-CM

## 2023-02-09 DIAGNOSIS — E669 Obesity, unspecified: Secondary | ICD-10-CM | POA: Insufficient documentation

## 2023-02-09 DIAGNOSIS — D61818 Other pancytopenia: Secondary | ICD-10-CM

## 2023-02-09 LAB — CBC
HCT: 34.6 % — ABNORMAL LOW (ref 36.0–46.0)
Hemoglobin: 10.7 g/dL — ABNORMAL LOW (ref 12.0–15.0)
MCH: 27.7 pg (ref 26.0–34.0)
MCHC: 30.9 g/dL (ref 30.0–36.0)
MCV: 89.6 fL (ref 80.0–100.0)
Platelets: 132 10*3/uL — ABNORMAL LOW (ref 150–400)
RBC: 3.86 MIL/uL — ABNORMAL LOW (ref 3.87–5.11)
RDW: 13.5 % (ref 11.5–15.5)
WBC: 2.8 10*3/uL — ABNORMAL LOW (ref 4.0–10.5)
nRBC: 0 % (ref 0.0–0.2)

## 2023-02-09 LAB — COMPREHENSIVE METABOLIC PANEL
ALT: 12 U/L (ref 0–44)
AST: 18 U/L (ref 15–41)
Albumin: 3.7 g/dL (ref 3.5–5.0)
Alkaline Phosphatase: 53 U/L (ref 38–126)
Anion gap: 9 (ref 5–15)
BUN: 24 mg/dL — ABNORMAL HIGH (ref 8–23)
CO2: 28 mmol/L (ref 22–32)
Calcium: 9.3 mg/dL (ref 8.9–10.3)
Chloride: 101 mmol/L (ref 98–111)
Creatinine, Ser: 0.99 mg/dL (ref 0.44–1.00)
GFR, Estimated: 55 mL/min — ABNORMAL LOW (ref >60)
Glucose, Bld: 92 mg/dL (ref 70–99)
Potassium: 4.5 mmol/L (ref 3.5–5.1)
Sodium: 138 mmol/L (ref 135–145)
Total Bilirubin: 0.4 mg/dL (ref 0.3–1.2)
Total Protein: 6.8 g/dL (ref 6.5–8.1)

## 2023-02-09 LAB — PROTIME-INR
INR: 1.1 (ref 0.8–1.2)
Prothrombin Time: 14.2 s (ref 11.4–15.2)

## 2023-02-09 MED ORDER — FERROUS SULFATE 325 (65 FE) MG PO TABS
325.0000 mg | ORAL_TABLET | ORAL | Status: DC
  Administered 2023-02-09 – 2023-02-11 (×2): 325 mg via ORAL
  Filled 2023-02-09 (×2): qty 1

## 2023-02-09 NOTE — Assessment & Plan Note (Addendum)
Resolved

## 2023-02-09 NOTE — Assessment & Plan Note (Signed)
Continue metoprolol, Eliquis

## 2023-02-09 NOTE — Hospital Course (Signed)
Mrs. Bossman is an 89 y.o. F with HTN, AS s/p TAVR, CAD no recent PCI, pAF not on AC, hiatal hernia and anxiety/depression who presented with 2 weeks leg swelling, DOE, chest burning.  In the ER, ECG and troponins normal.  [place] negative for DVT.  BP 236/113 mmHg, BNP 250 and so she was admitted for hypertensive urgency, stated on furosemide and hydralazine and amlodipine.

## 2023-02-09 NOTE — Assessment & Plan Note (Signed)
Per report, this is chronic, maybe worse at present.  Patient is extremely anxious about it.  Unfortunately, she is unable to localize it (it is sometimes in her back, sometimes in her sternum, sometimes her upper abdomen).  She and family are unable to identify any provoking factors either (not clearly exacerbated by eating, exertion, position, seems to come and go spontaneously).  CT imaging of the abdomen and pelvis are unremarkable.  CXR unremarkable.  Echo unchanged from previous.  Serum chemistries are unrevealing.  GI were consulted, they have offered endoscopy which is pending tomorrow. - Consult GI

## 2023-02-09 NOTE — Assessment & Plan Note (Addendum)
Presented with BP >200/100, started on antihypertensives, HR improved.  Still labile, but range 110-170, with home metoprolol alone.  Cardiology consulted.  Troponins and ECG normal.  Echo without RWMA, change in EF, change in TAVR.  Infarction and ischemia ruled out.  CHF ruled out.   - Continue metoprolol

## 2023-02-09 NOTE — H&P (View-Only) (Signed)
Daily Progress Note  DOA: 02/02/2023 Hospital Day: 8  Chief Complaint:  upper abdominal pain   ASSESSMENT    Brief Narrative:  Sherri Mcmillan is a 89 y.o. year old female with a history of  chronic anxiety, depression, hypertension, sinus bradycardia, CAD, DVT, paroxysmal atrial fibrillation not on Eliquis, status post TAVR 2020, GERD / hiatal hernia. Admitted 10/10 for hypertensive urgency / edema. GI saw in consult 10/14 for acute on chronic epigastric pain   Acute on chronic epigastric pain . Etiology unclear. Pain refractory to high dose PPI and carafate. CT AP with contrast shows cholelithiasis, no cholecystitis or other acute findings, .  Plan was for EGD yesterday but had to be cancelled due to lack of IV access.  *Interval history:   LFTs remain normal. Hgb stable at 10.7 . She feels okay right now.   Chronic iron deficiency anemia.   Volume overload / lower extremity edema.  LE doppler studies negative for DVT. Echocardiogram showed normal systolic function. Cardiology has evaluated. She is diuresed well  PAF.  On Eliquis at home  Chronic pancytopenia - stable  Accelerated HTN, resolving.   Principal Problem:   Acute CHF (congestive heart failure) (HCC) Active Problems:   Abdominal pain, bilateral upper quadrant   PLAN   --Rescheduled EGD to be done tomorrow.  --IV team coming to place IV --Continue BID PPI --Continue Carafate for now ( GFR  55) --TRH has initiated oral iron replacement. Will see what EGD shows  Subjective   She is awaiting IV team to place IV. Had lunch. Ambulating in hall  Objective    Recent Labs    02/08/23 0521 02/09/23 0450  WBC 3.3* 2.8*  HGB 10.3* 10.7*  HCT 33.2* 34.6*  PLT 119* 132*   BMET Recent Labs    02/08/23 0521 02/09/23 0450  NA 136 138  K 4.2 4.5  CL 102 101  CO2 26 28  GLUCOSE 88 92  BUN 25* 24*  CREATININE 0.95 0.99  CALCIUM 9.2 9.3   LFT Recent Labs    02/09/23 0450  PROT 6.8  ALBUMIN  3.7  AST 18  ALT 12  ALKPHOS 53  BILITOT 0.4   PT/INR Recent Labs    02/08/23 0910 02/09/23 0450  LABPROT 14.0 14.2  INR 1.1 1.1     Imaging:  CT ABDOMEN PELVIS W CONTRAST CLINICAL DATA:  Abdominal pain.  EXAM: CT ABDOMEN AND PELVIS WITH CONTRAST  TECHNIQUE: Multidetector CT imaging of the abdomen and pelvis was performed using the standard protocol following bolus administration of intravenous contrast.  RADIATION DOSE REDUCTION: This exam was performed according to the departmental dose-optimization program which includes automated exposure control, adjustment of the mA and/or kV according to patient size and/or use of iterative reconstruction technique.  CONTRAST:  OMNIPAQUE IOHEXOL 300 MG/ML  SOLN  COMPARISON:  CT abdomen and pelvis dated 08/05/2021.  FINDINGS: Lower chest: There is a moderate hiatal hernia. Surgical clips are seen at the gastroesophageal junction.  Hepatobiliary: A 1.1 cm cyst is seen in the left hepatic lobe. Gallstones are seen in the gallbladder neck. No gallbladder wall thickening or biliary dilatation.  Pancreas: Unremarkable. No pancreatic ductal dilatation or surrounding inflammatory changes.  Spleen: Normal in size without focal abnormality.  Adrenals/Urinary Tract: Adrenal glands are unremarkable. Kidneys are normal, without renal calculi, focal lesion, or hydronephrosis. There is mild diffuse urinary bladder wall thickening, similar to prior exam.  Stomach/Bowel: Stomach is within normal limits. Enteric  contrast reaches the splenic flexure. Appendix is not identified and likely absent. There is colonic diverticulosis without evidence of diverticulitis. No evidence of bowel wall thickening, distention, or inflammatory changes.  Vascular/Lymphatic: Aortic atherosclerosis. No enlarged abdominal or pelvic lymph nodes.  Reproductive: Status post hysterectomy. No adnexal masses.  Other: No abdominal wall hernia or  abnormality. No abdominopelvic ascites.  Musculoskeletal: Degenerative changes are seen in the spine.  IMPRESSION: 1. Mild diffuse urinary bladder wall thickening is similar to prior exam. There is a nonspecific but can be seen in the setting of cystitis. 2. Moderate hiatal hernia. 3. Cholelithiasis without evidence of cholecystitis.  Aortic Atherosclerosis (ICD10-I70.0).  Electronically Signed   By: Romona Curls M.D.   On: 02/05/2023 18:21     Scheduled inpatient medications:   brimonidine  1 drop Left Eye BID   vitamin B-12  500 mcg Oral Daily   dorzolamide-timolol  1 drop Left Eye BID   ferrous sulfate  325 mg Oral QODAY   gabapentin  100 mg Oral TID   latanoprost  1 drop Both Eyes QHS   metoprolol succinate  25 mg Oral Daily   pantoprazole  40 mg Oral BID   polyethylene glycol  17 g Oral Daily   QUEtiapine  25 mg Oral QHS   senna-docusate  2 tablet Oral QHS   sucralfate  1 g Oral TID WC & HS   Continuous inpatient infusions:  PRN inpatient medications: acetaminophen **OR** acetaminophen, albuterol, alum & mag hydroxide-simeth, hydrALAZINE, HYDROcodone-acetaminophen, hydrOXYzine, ondansetron **OR** ondansetron (ZOFRAN) IV, traZODone  Vital signs in last 24 hours: Temp:  [98 F (36.7 C)-98.7 F (37.1 C)] 98 F (36.7 C) (10/16 0503) Pulse Rate:  [52-53] 52 (10/16 0503) Resp:  [16] 16 (10/16 0503) BP: (116-157)/(77-84) 157/84 (10/16 0503) SpO2:  [98 %-100 %] 100 % (10/16 0503) Weight:  [49 kg] 49 kg (10/16 0459) Last BM Date : 02/08/23  Intake/Output Summary (Last 24 hours) at 02/09/2023 1341 Last data filed at 02/09/2023 0600 Gross per 24 hour  Intake 600 ml  Output --  Net 600 ml    Intake/Output from previous day: 10/15 0701 - 10/16 0700 In: 600 [P.O.:600] Out: -  Intake/Output this shift: No intake/output data recorded.   Physical Exam:  General: Alert female in NAD. Ambulating in hall Pulmonary: Normal respiratory effort Abdomen: Soft,  nondistended, nontender. Normal bowel sounds. Psych: Pleasant. Cooperative. Insight appears normal.     LOS: 6 days   Willette Cluster ,NP 02/09/2023, 1:41 PM

## 2023-02-09 NOTE — Progress Notes (Signed)
Progress Note   Patient: Sherri Mcmillan WUJ:[id number] DOB: [birth year] DOA: 02/02/2023     6 DOS: the patient was seen and examined on 02/09/2023 at 9:15 AM      Brief hospital course: Mrs. Gover is an 89 y.o. F with HTN, AS s/p TAVR, CAD no recent PCI, pAF not on AC, hiatal hernia and anxiety/depression who presented with 2 weeks leg swelling, DOE, chest burning.  In the ER, ECG and troponins normal.  [place] negative for DVT.  BP 236/113 mmHg, BNP 250 and so she was admitted for hypertensive urgency, stated on furosemide and hydralazine and amlodipine.     Assessment and Plan: * Hypertensive urgency Presented with BP >200/100, started on antihypertensives, HR improved.  Still labile, but range 110-170, with home metoprolol alone.  Cardiology consulted.  Troponins and ECG normal.  Echo without RWMA, change in EF, change in TAVR.  Infarction and ischemia ruled out.  CHF ruled out.   - Continue metoprolol  Abdominal pain, bilateral upper quadrant Per report, this is chronic, maybe worse at present.  Patient is extremely anxious about it.  Unfortunately, she is unable to localize it (it is sometimes in her back, sometimes in her sternum, sometimes her upper abdomen).  She and family are unable to identify any provoking factors either (not clearly exacerbated by eating, exertion, position, seems to come and go spontaneously).  CT imaging of the abdomen and pelvis are unremarkable.  CXR unremarkable.  Echo unchanged from previous.  Serum chemistries are unrevealing.  GI were consulted, they have offered endoscopy which is pending tomorrow. - Consult GI  Obesity (BMI 30-39.9) BMI 34  Paroxysmal atrial fibrillation - Continue metoprolol, Eliquis  Pancytopenia (HCC) Iron deficiency anemia due to unknown cause Mild.  Probably iron deficiency superimposed on chronic mild leukopenia and mild thrombocytopenia.  Defer work up to PCP - Defer work up to PCP  Hiatal hernia with GERD - COnsult  GI - Continue C arafate, PPI, Maalox  S/P TAVR (transcatheter aortic valve replacement)    Hypokalemia Resolved  Anxiety and depression - Continue Atarax, quetiapine          Subjective: Patient still having migratory pain in her torso.  No fever, no respiratory symptoms at rest.  She is short of breath with exertion.     Physical Exam: BP (!) 157/84 (BP Location: Left Arm)   Pulse (!) 52   Temp 98 F (36.7 C) (Oral)   Resp 16   Ht 4' (1.219 m)   Wt 49 kg   LMP 04/26/1950   SpO2 100%   BMI 32.96 kg/m   Elderly adult female, sleeping rouses and is interactive RRR soft systolic murmur, no JVD, no LE edema Respiratory rate seems increased, lung sounds equal, diminished, no rales or wheezing Abdomen with diffuse guarding, no focality.  No distension, ascites Attention normal, responds to questions, train of thought is scattered, anxious.  Face symmetric, speech slightly dysfluent at baseline.  Moves upper extremities with generalized symmetric weakness, leg strength not tested, A&Ox4.     Data Reviewed: Discussed with GI Comprehensive metabolic panel normal Ferritin 9 Hemoglobin 10.7, no change Right blood cell count 2.8 Platelets 132  Family Communication: Adopted daughter/POA at the bedside    Disposition: Status is: Inpatient         Author: Alberteen Sam, MD 02/09/2023 2:46 PM  For on call review [email or web address].

## 2023-02-09 NOTE — Progress Notes (Addendum)
Daily Progress Note  DOA: 02/02/2023 Hospital Day: 8  Chief Complaint:  upper abdominal pain   ASSESSMENT    Brief Narrative:  Sherri Mcmillan is a 89 y.o. year old female with a history of  chronic anxiety, depression, hypertension, sinus bradycardia, CAD, DVT, paroxysmal atrial fibrillation not on Eliquis, status post TAVR 2020, GERD / hiatal hernia. Admitted 10/10 for hypertensive urgency / edema. GI saw in consult 10/14 for acute on chronic epigastric pain   Acute on chronic epigastric pain . Etiology unclear. Pain refractory to high dose PPI and carafate. CT AP with contrast shows cholelithiasis, no cholecystitis or other acute findings, .  Plan was for EGD yesterday but had to be cancelled due to lack of IV access.  *Interval history:   LFTs remain normal. Hgb stable at 10.7 . She feels okay right now.   Chronic iron deficiency anemia.   Volume overload / lower extremity edema.  LE doppler studies negative for DVT. Echocardiogram showed normal systolic function. Cardiology has evaluated. She is diuresed well  PAF.  On Eliquis at home  Chronic pancytopenia - stable  Accelerated HTN, resolving.   Principal Problem:   Acute CHF (congestive heart failure) (HCC) Active Problems:   Abdominal pain, bilateral upper quadrant   PLAN   --Rescheduled EGD to be done tomorrow.  --IV team coming to place IV --Continue BID PPI --Continue Carafate for now ( GFR  55) --TRH has initiated oral iron replacement. Will see what EGD shows  Subjective   She is awaiting IV team to place IV. Had lunch. Ambulating in hall  Objective    Recent Labs    02/08/23 0521 02/09/23 0450  WBC 3.3* 2.8*  HGB 10.3* 10.7*  HCT 33.2* 34.6*  PLT 119* 132*   BMET Recent Labs    02/08/23 0521 02/09/23 0450  NA 136 138  K 4.2 4.5  CL 102 101  CO2 26 28  GLUCOSE 88 92  BUN 25* 24*  CREATININE 0.95 0.99  CALCIUM 9.2 9.3   LFT Recent Labs    02/09/23 0450  PROT 6.8  ALBUMIN  3.7  AST 18  ALT 12  ALKPHOS 53  BILITOT 0.4   PT/INR Recent Labs    02/08/23 0910 02/09/23 0450  LABPROT 14.0 14.2  INR 1.1 1.1     Imaging:  CT ABDOMEN PELVIS W CONTRAST CLINICAL DATA:  Abdominal pain.  EXAM: CT ABDOMEN AND PELVIS WITH CONTRAST  TECHNIQUE: Multidetector CT imaging of the abdomen and pelvis was performed using the standard protocol following bolus administration of intravenous contrast.  RADIATION DOSE REDUCTION: This exam was performed according to the departmental dose-optimization program which includes automated exposure control, adjustment of the mA and/or kV according to patient size and/or use of iterative reconstruction technique.  CONTRAST:  OMNIPAQUE IOHEXOL 300 MG/ML  SOLN  COMPARISON:  CT abdomen and pelvis dated 08/05/2021.  FINDINGS: Lower chest: There is a moderate hiatal hernia. Surgical clips are seen at the gastroesophageal junction.  Hepatobiliary: A 1.1 cm cyst is seen in the left hepatic lobe. Gallstones are seen in the gallbladder neck. No gallbladder wall thickening or biliary dilatation.  Pancreas: Unremarkable. No pancreatic ductal dilatation or surrounding inflammatory changes.  Spleen: Normal in size without focal abnormality.  Adrenals/Urinary Tract: Adrenal glands are unremarkable. Kidneys are normal, without renal calculi, focal lesion, or hydronephrosis. There is mild diffuse urinary bladder wall thickening, similar to prior exam.  Stomach/Bowel: Stomach is within normal limits. Enteric  contrast reaches the splenic flexure. Appendix is not identified and likely absent. There is colonic diverticulosis without evidence of diverticulitis. No evidence of bowel wall thickening, distention, or inflammatory changes.  Vascular/Lymphatic: Aortic atherosclerosis. No enlarged abdominal or pelvic lymph nodes.  Reproductive: Status post hysterectomy. No adnexal masses.  Other: No abdominal wall hernia or  abnormality. No abdominopelvic ascites.  Musculoskeletal: Degenerative changes are seen in the spine.  IMPRESSION: 1. Mild diffuse urinary bladder wall thickening is similar to prior exam. There is a nonspecific but can be seen in the setting of cystitis. 2. Moderate hiatal hernia. 3. Cholelithiasis without evidence of cholecystitis.  Aortic Atherosclerosis (ICD10-I70.0).  Electronically Signed   By: Romona Curls M.D.   On: 02/05/2023 18:21     Scheduled inpatient medications:   brimonidine  1 drop Left Eye BID   vitamin B-12  500 mcg Oral Daily   dorzolamide-timolol  1 drop Left Eye BID   ferrous sulfate  325 mg Oral QODAY   gabapentin  100 mg Oral TID   latanoprost  1 drop Both Eyes QHS   metoprolol succinate  25 mg Oral Daily   pantoprazole  40 mg Oral BID   polyethylene glycol  17 g Oral Daily   QUEtiapine  25 mg Oral QHS   senna-docusate  2 tablet Oral QHS   sucralfate  1 g Oral TID WC & HS   Continuous inpatient infusions:  PRN inpatient medications: acetaminophen **OR** acetaminophen, albuterol, alum & mag hydroxide-simeth, hydrALAZINE, HYDROcodone-acetaminophen, hydrOXYzine, ondansetron **OR** ondansetron (ZOFRAN) IV, traZODone  Vital signs in last 24 hours: Temp:  [98 F (36.7 C)-98.7 F (37.1 C)] 98 F (36.7 C) (10/16 0503) Pulse Rate:  [52-53] 52 (10/16 0503) Resp:  [16] 16 (10/16 0503) BP: (116-157)/(77-84) 157/84 (10/16 0503) SpO2:  [98 %-100 %] 100 % (10/16 0503) Weight:  [49 kg] 49 kg (10/16 0459) Last BM Date : 02/08/23  Intake/Output Summary (Last 24 hours) at 02/09/2023 1341 Last data filed at 02/09/2023 0600 Gross per 24 hour  Intake 600 ml  Output --  Net 600 ml    Intake/Output from previous day: 10/15 0701 - 10/16 0700 In: 600 [P.O.:600] Out: -  Intake/Output this shift: No intake/output data recorded.   Physical Exam:  General: Alert female in NAD. Ambulating in hall Pulmonary: Normal respiratory effort Abdomen: Soft,  nondistended, nontender. Normal bowel sounds. Psych: Pleasant. Cooperative. Insight appears normal.     LOS: 6 days   Willette Cluster ,NP 02/09/2023, 1:41 PM

## 2023-02-09 NOTE — Assessment & Plan Note (Signed)
-   COnsult GI - Continue C arafate, PPI, Maalox

## 2023-02-09 NOTE — Assessment & Plan Note (Addendum)
Iron deficiency anemia due to unknown cause Mild.  Probably iron deficiency superimposed on chronic mild leukopenia and mild thrombocytopenia.  Defer work up to PCP - Defer work up to PCP

## 2023-02-09 NOTE — Assessment & Plan Note (Signed)
BMI 34

## 2023-02-09 NOTE — Progress Notes (Signed)
Mobility Specialist - Progress Note   02/09/23 1408  Mobility  Activity Ambulated with assistance in hallway  Level of Assistance Modified independent, requires aide device or extra time  Assistive Device  (HHA)  Distance Ambulated (ft) 250 ft  Activity Response Tolerated well  Mobility Referral Yes  $Mobility charge 1 Mobility  Mobility Specialist Start Time (ACUTE ONLY) 0144  Mobility Specialist Stop Time (ACUTE ONLY) 0207  Mobility Specialist Time Calculation (min) (ACUTE ONLY) 23 min   Pt received in bed and agreeable to mobility. Pt held onto hallway rails during ambulation due to unsteadiness. At EOS, pt c/o L sided chest pain. RN made aware. No other complaints during session. Pt to recliner after session with all needs met.   [facility]

## 2023-02-09 NOTE — Assessment & Plan Note (Signed)
-   Continue Atarax, quetiapine

## 2023-02-10 ENCOUNTER — Inpatient Hospital Stay (HOSPITAL_COMMUNITY): Payer: 59 | Admitting: Anesthesiology

## 2023-02-10 ENCOUNTER — Encounter (HOSPITAL_COMMUNITY): Payer: Self-pay | Admitting: Internal Medicine

## 2023-02-10 ENCOUNTER — Encounter (HOSPITAL_COMMUNITY)
Admission: EM | Disposition: A | Discharge status: Home Health | Payer: Self-pay | Source: Home / Self Care | Attending: Internal Medicine

## 2023-02-10 DIAGNOSIS — G8929 Other chronic pain: Secondary | ICD-10-CM

## 2023-02-10 DIAGNOSIS — K3189 Other diseases of stomach and duodenum: Secondary | ICD-10-CM

## 2023-02-10 DIAGNOSIS — R1013 Epigastric pain: Secondary | ICD-10-CM | POA: Diagnosis not present

## 2023-02-10 DIAGNOSIS — K449 Diaphragmatic hernia without obstruction or gangrene: Secondary | ICD-10-CM | POA: Diagnosis not present

## 2023-02-10 DIAGNOSIS — F419 Anxiety disorder, unspecified: Secondary | ICD-10-CM | POA: Diagnosis not present

## 2023-02-10 DIAGNOSIS — K2270 Barrett's esophagus without dysplasia: Secondary | ICD-10-CM

## 2023-02-10 DIAGNOSIS — R1011 Right upper quadrant pain: Secondary | ICD-10-CM | POA: Diagnosis not present

## 2023-02-10 DIAGNOSIS — K2970 Gastritis, unspecified, without bleeding: Secondary | ICD-10-CM | POA: Diagnosis not present

## 2023-02-10 DIAGNOSIS — I160 Hypertensive urgency: Secondary | ICD-10-CM | POA: Diagnosis not present

## 2023-02-10 DIAGNOSIS — F413 Other mixed anxiety disorders: Secondary | ICD-10-CM

## 2023-02-10 DIAGNOSIS — Q399 Congenital malformation of esophagus, unspecified: Secondary | ICD-10-CM

## 2023-02-10 DIAGNOSIS — R1032 Left lower quadrant pain: Secondary | ICD-10-CM

## 2023-02-10 DIAGNOSIS — R1012 Left upper quadrant pain: Secondary | ICD-10-CM | POA: Diagnosis not present

## 2023-02-10 HISTORY — PX: BIOPSY: SHX5522

## 2023-02-10 HISTORY — PX: ESOPHAGOGASTRODUODENOSCOPY: SHX5428

## 2023-02-10 LAB — COMPREHENSIVE METABOLIC PANEL WITH GFR
ALT: 11 U/L (ref 0–44)
AST: 15 U/L (ref 15–41)
Albumin: 3.4 g/dL — ABNORMAL LOW (ref 3.5–5.0)
Alkaline Phosphatase: 44 U/L (ref 38–126)
Anion gap: 10 (ref 5–15)
BUN: 26 mg/dL — ABNORMAL HIGH (ref 8–23)
CO2: 26 mmol/L (ref 22–32)
Calcium: 9.4 mg/dL (ref 8.9–10.3)
Chloride: 100 mmol/L (ref 98–111)
Creatinine, Ser: 0.88 mg/dL (ref 0.44–1.00)
GFR, Estimated: >60 mL/min
Glucose, Bld: 92 mg/dL (ref 70–99)
Potassium: 4.1 mmol/L (ref 3.5–5.1)
Sodium: 136 mmol/L (ref 135–145)
Total Bilirubin: 0.5 mg/dL (ref 0.3–1.2)
Total Protein: 6.8 g/dL (ref 6.5–8.1)

## 2023-02-10 LAB — CBC
HCT: 33.1 % — ABNORMAL LOW (ref 36.0–46.0)
Hemoglobin: 10.3 g/dL — ABNORMAL LOW (ref 12.0–15.0)
MCH: 27.9 pg (ref 26.0–34.0)
MCHC: 31.1 g/dL (ref 30.0–36.0)
MCV: 89.7 fL (ref 80.0–100.0)
Platelets: 130 K/uL — ABNORMAL LOW (ref 150–400)
RBC: 3.69 MIL/uL — ABNORMAL LOW (ref 3.87–5.11)
RDW: 13.5 % (ref 11.5–15.5)
WBC: 2.7 K/uL — ABNORMAL LOW (ref 4.0–10.5)
nRBC: 0 % (ref 0.0–0.2)

## 2023-02-10 SURGERY — EGD (ESOPHAGOGASTRODUODENOSCOPY)
Anesthesia: Monitor Anesthesia Care

## 2023-02-10 MED ORDER — SUCRALFATE 1 G PO TABS
1.0000 g | ORAL_TABLET | Freq: Three times a day (TID) | ORAL | Status: DC
  Administered 2023-02-10 – 2023-02-11 (×6): 1 g via ORAL
  Filled 2023-02-10 (×6): qty 1

## 2023-02-10 MED ORDER — PROPOFOL 500 MG/50ML IV EMUL
INTRAVENOUS | Status: DC | PRN
  Administered 2023-02-10: 75 ug/kg/min via INTRAVENOUS

## 2023-02-10 MED ORDER — SODIUM CHLORIDE 0.9 % IV SOLN
INTRAVENOUS | Status: DC | PRN
Start: 2023-02-10 — End: 2023-02-10

## 2023-02-10 MED ORDER — PROPOFOL 500 MG/50ML IV EMUL
INTRAVENOUS | Status: AC
  Filled 2023-02-10: qty 50

## 2023-02-10 MED ORDER — HYOSCYAMINE SULFATE 0.125 MG SL SUBL
0.1250 mg | SUBLINGUAL_TABLET | Freq: Two times a day (BID) | SUBLINGUAL | Status: DC | PRN
  Administered 2023-02-10 – 2023-02-11 (×3): 0.125 mg via SUBLINGUAL
  Filled 2023-02-10 (×3): qty 1

## 2023-02-10 MED ORDER — PROPOFOL 10 MG/ML IV BOLUS
INTRAVENOUS | Status: DC | PRN
Start: 2023-02-10 — End: 2023-02-10
  Administered 2023-02-10: 40 mg via INTRAVENOUS
  Administered 2023-02-10 (×2): 10 mg via INTRAVENOUS

## 2023-02-10 MED ORDER — LIDOCAINE HCL (CARDIAC) PF 100 MG/5ML IV SOSY
PREFILLED_SYRINGE | INTRAVENOUS | Status: DC | PRN
Start: 2023-02-10 — End: 2023-02-10
  Administered 2023-02-10: 40 mg via INTRAVENOUS

## 2023-02-10 NOTE — Anesthesia Postprocedure Evaluation (Signed)
Anesthesia Post Note  Patient: Sherri Mcmillan  Procedure(s) Performed: ESOPHAGOGASTRODUODENOSCOPY (EGD) BIOPSY     Patient location during evaluation: PACU Anesthesia Type: MAC Level of consciousness: awake and alert Pain management: pain level controlled Vital Signs Assessment: post-procedure vital signs reviewed and stable Respiratory status: spontaneous breathing, nonlabored ventilation, respiratory function stable and patient connected to nasal cannula oxygen Cardiovascular status: blood pressure returned to baseline and stable Postop Assessment: no apparent nausea or vomiting Anesthetic complications: no   No notable events documented.  Last Vitals:  Vitals:   02/10/23 0940 02/10/23 0950  BP: 139/77 127/78  Pulse: 60 (!) 51  Resp: 13 12  Temp:    SpO2: 96% 95%    Last Pain:  Vitals:   02/10/23 0950  TempSrc:   PainSc: 6                  Lowella Curb

## 2023-02-10 NOTE — Anesthesia Preprocedure Evaluation (Signed)
Anesthesia Evaluation  Patient identified by MRN, date of birth, ID band Patient awake    Reviewed: Allergy & Precautions, H&P , NPO status , Patient's Chart, lab work & pertinent test results  Airway Mallampati: II  TM Distance: >3 FB Neck ROM: Full    Dental no notable dental hx.    Pulmonary neg pulmonary ROS, former smoker   Pulmonary exam normal breath sounds clear to auscultation       Cardiovascular hypertension, Pt. on medications  Rhythm:Regular Rate:Normal + Systolic murmurs S/P TAVR  1. Left ventricular ejection fraction, by estimation, is 60 to 65%. The  left ventricle has normal function. The left ventricle has no regional  wall motion abnormalities. Left ventricular diastolic parameters are  indeterminate. Elevated left atrial  pressure. The E/e' is 22.   2. Right ventricular systolic function is normal. The right ventricular  size is normal. There is normal pulmonary artery systolic pressure. The  estimated right ventricular systolic pressure is 24.0 mmHg.   3. The mitral valve is normal in structure. Trivial mitral valve  regurgitation. No evidence of mitral stenosis.   4. 26 mm Medtronic Evolut PRO TAVR valve well-seated in aortic position,  no valvular stenosis or regurgitation, or paravalvular leak. . Procedure  Date: 05/30/2018.     Neuro/Psych  Headaches  Anxiety Depression     negative psych ROS   GI/Hepatic Neg liver ROS,GERD  ,,  Endo/Other  negative endocrine ROS    Renal/GU negative Renal ROS  negative genitourinary   Musculoskeletal negative musculoskeletal ROS (+)    Abdominal   Peds negative pediatric ROS (+)  Hematology  (+) Blood dyscrasia, anemia   Anesthesia Other Findings   Reproductive/Obstetrics negative OB ROS                             Anesthesia Physical Anesthesia Plan  ASA: 3  Anesthesia Plan: MAC   Post-op Pain Management: Minimal or  no pain anticipated   Induction: Intravenous  PONV Risk Score and Plan: 2 and Propofol infusion and Treatment may vary due to age or medical condition  Airway Management Planned: Simple Face Mask  Additional Equipment:   Intra-op Plan:   Post-operative Plan:   Informed Consent: I have reviewed the patients History and Physical, chart, labs and discussed the procedure including the risks, benefits and alternatives for the proposed anesthesia with the patient or authorized representative who has indicated his/her understanding and acceptance.     Dental advisory given  Plan Discussed with: CRNA and Surgeon  Anesthesia Plan Comments:         Anesthesia Quick Evaluation

## 2023-02-10 NOTE — Plan of Care (Signed)

## 2023-02-10 NOTE — Plan of Care (Signed)
Problem: Activity: Goal: Risk for activity intolerance will decrease Outcome: Progressing   Problem: Skin Integrity: Goal: Risk for impaired skin integrity will decrease Outcome: Progressing

## 2023-02-10 NOTE — Op Note (Addendum)
[facility] Patient Name: Sherri Mcmillan Procedure Date: 02/10/2023 MRN: [id number] Attending MD: Corliss Parish , MD, [id number] Date of Birth: [birth year] CSN: [id number] Age: 89 Admit Type: Inpatient Procedure:                Upper GI endoscopy Indications:              Epigastric abdominal pain, Abdominal pain in the                            left upper quadrant, Abdominal pain in the left                            lower quadrant, Follow-up of achalasia Providers:                Corliss Parish, MD, Fransisca Connors, Marja Kays, Technician Referring MD:             Starr Lake. Myrtie Neither, MD, inpatient medical service Medicines:                Monitored Anesthesia Care Complications:            No immediate complications. Estimated Blood Loss:     Estimated blood loss was minimal. Procedure:                Pre-Anesthesia Assessment:                           - Prior to the procedure, a History and Physical                            was performed, and patient medications and                            allergies were reviewed. The patient's tolerance of                            previous anesthesia was also reviewed. The risks                            and benefits of the procedure and the sedation                            options and risks were discussed with the patient.                            All questions were answered, and informed consent                            was obtained. Prior Anticoagulants: The patient has                            taken Eliquis (apixaban), last dose was 4 days  prior to procedure. ASA Grade Assessment: III - A                            patient with severe systemic disease. After                            reviewing the risks and benefits, the patient was                            deemed in satisfactory condition to undergo the                             procedure.                           After obtaining informed consent, the endoscope was                            passed under direct vision. Throughout the                            procedure, the patient's blood pressure, pulse, and                            oxygen saturations were monitored continuously. The                            GIF-H190 ([id number]) Olympus endoscope was introduced                            through the mouth, and advanced to the second part                            of duodenum. The upper GI endoscopy was                            accomplished without difficulty. The patient                            tolerated the procedure. Scope In: Scope Out: Findings:      The examined esophagus was mildly tortuous.      The esophagus and gastroesophageal junction were examined with white       light and narrow band imaging (NBI) from a forward view and retroflexed       position. There were esophageal mucosal changes consistent with       short-segment Barrett's esophagus. These changes involved the mucosa       extending to the Z-line (35 cm from the incisors). One tongue of       salmon-colored mucosa was present from 33 to 35 cm and two tongues of       salmon-colored mucosa were present from 32 to 35 cm. The maximum       longitudinal extent of these esophageal mucosal changes was 3 cm in       length. This was biopsied with a cold forceps for  histology.      No other gross lesions were noted in the entire esophagus.      A medium-sized hiatal hernia was found. The hiatal narrowing was 40 cm       from the incisors.      A J-shaped deformity was found of the stomach.      Segmental moderate inflammation characterized by erythema, friability       and granularity was found in the entire examined stomach. Biopsies were       taken with a cold forceps for histology and Helicobacter pylori testing.      No gross lesions were noted in the duodenal bulb, in the first  portion       of the duodenum and in the second portion of the duodenum. Biopsies were       taken with a cold forceps for histology. Impression:               - Tortuous esophagus.                           - Esophageal mucosal changes consistent with                            short-segment Barrett's esophagus. Biopsied.                           - No other gross lesions in the entire esophagus.                           - Medium-sized hiatal hernia.                           - J-shaped deformity.                           - Gastritis. Biopsied.                           - No gross lesions in the duodenal bulb, in the                            first portion of the duodenum and in the second                            portion of the duodenum. Biopsied. Moderate Sedation:      Not Applicable - Patient had care per Anesthesia. Recommendation:           - The patient will be observed post-procedure,                            until all discharge criteria are met.                           - Return patient to hospital ward for ongoing care.                           - Advance diet as tolerated.                           -  Continue high-dose PPI 40 mg twice daily.                           - Follow-up pathology.                           - Transition Carafate therapy from liquid to tablet                            form as majority of her inflammation is stomach not                            esophagus related.                           - Not clear that the Bentyl or Levsin may be                            helpful but we will order this for her to trial                            while she is in-house for 1-2 doses per day around                            times of significant pain episodes to see if this                            helps or not.                           - I do not know that the large hiatal hernia is                            playing a role with her symptoms persea, but  as                            this is epigastric in nature initially, 1 could                            consider, but the only way of fixing this would be                            surgical management and I am not sure she is a                            candidate for that at her age or her other medical                            comorbidities.                           - Patient's chronic abdominal pain that she has had  for many years is not clearly defined by just the                            gastritis that was found at this time. I am not                            convinced that she has biliary colic and so I do                            not think that a CCK HIDA will be helpful at this                            time. Functional abdominal pain seems most likely                            based on imaging and the chronicity of her                            symptoms. Follow-up on the outpatient setting with                            GI, previously Dr. Myrtie Neither, if she wishes can be                            arranged but she has not reached out to him or seen                            him or discussed the symptoms for over 6 years.                           - Consideration of TCA in future can be had but                            with her age and medical comorbidities would need                            to be supervised and consider this at bedtime to                            minimize risks.                           - Anticoagulation can restart on 10/18 AM to                            decrease risk of post-interventional bleeding.                           - From an inpatient GI perspective, there is no                            additional  workup that we would recommend.                           - The findings and recommendations were discussed                            with the patient.                           - The findings and  recommendations were discussed                            with the referring physician. Procedure Code(s):        --- Professional ---                           [id number], Esophagogastroduodenoscopy, flexible,                            transoral; with biopsy, single or multiple Diagnosis Code(s):        --- Professional ---                           Q39.9, Congenital malformation of esophagus,                            unspecified                           K22.89, Other specified disease of esophagus                           K44.9, Diaphragmatic hernia without obstruction or                            gangrene                           K31.89, Other diseases of stomach and duodenum                           K29.70, Gastritis, unspecified, without bleeding                           R10.13, Epigastric pain                           R10.12, Left upper quadrant pain                           R10.32, Left lower quadrant pain                           K22.0, Achalasia of cardia CPT copyright 2022 [employer]. All rights reserved. The codes documented in this report are preliminary and upon coder review may  be revised to meet current compliance requirements. Corliss Parish, MD 02/10/2023 9:35:09 AM Number of Addenda: 0

## 2023-02-10 NOTE — Interval H&P Note (Signed)
History and Physical Interval Note:  02/10/2023 8:49 AM  Sherri Mcmillan  has presented today for surgery, with the diagnosis of abdominal pain.  The various methods of treatment have been discussed with the patient and family. After consideration of risks, benefits and other options for treatment, the patient has consented to  Procedure(s): ESOPHAGOGASTRODUODENOSCOPY (EGD) (N/A) as a surgical intervention.  The patient's history has been reviewed, patient examined, no change in status, stable for surgery.  I have reviewed the patient's chart and labs.  Questions were answered to the patient's satisfaction.     [employer]

## 2023-02-10 NOTE — Transfer of Care (Signed)
Immediate Anesthesia Transfer of Care Note  Patient: [name]  Procedure(s) Performed: ESOPHAGOGASTRODUODENOSCOPY (EGD) BIOPSY  Patient Location: Endoscopy Unit  Anesthesia Type:MAC  Level of Consciousness: drowsy, patient cooperative, and responds to stimulation  Airway & Oxygen Therapy: Patient Spontanous Breathing and Patient connected to face mask oxygen  Post-op Assessment: Report given to RN and Post -op Vital signs reviewed and stable  Post vital signs: Reviewed and stable  Last Vitals:  Vitals Value Taken Time  BP    Temp    Pulse    Resp 20 02/10/23 0932  SpO2    Vitals shown include unfiled device data.  Last Pain:  Vitals:   02/10/23 0814  TempSrc: Temporal  PainSc: 9       Patients Stated Pain Goal: 2 (02/09/23 2115)  Complications: No notable events documented.

## 2023-02-10 NOTE — Progress Notes (Signed)
Progress Note   Patient: Sherri Mcmillan WUJ:[id number] DOB: 02-12-1933 DOA: 02/02/2023     7 DOS: the patient was seen and examined on 02/10/2023 at 11:25AM      Brief hospital course: Sherri Mcmillan is an 89 y.o. F with HTN, AS s/p TAVR, CAD no recent PCI, pAF not on AC, hiatal hernia and anxiety/depression who presented with leg swelling.     Assessment and Plan: *Hypertensive urgency Resolved - Continue metoprolol  Abdominal pain EGD today showed mild gastritis, known hiatal hernia, doubt either of these is the cause of her symptoms.  Extensive chest workup is also been reassuring. - Levsin as needed  Paroxysmal atrial fibrillation Stable - Continue metoprolol and Eliquis  Hiatal hernia - Continue Carafate and Maalox  Anxiety and depression - Continue Atarax, quetiapine          Subjective: Essentially no change to her migratory pain, no respiratory symptoms, no fever.  No nursing concerns.     Physical Exam: BP 126/70 (BP Location: Left Arm)   Pulse 62   Temp 98.4 F (36.9 C) (Oral)   Resp 12   Ht 4' (1.219 m)   Wt 48.9 kg   LMP 04/26/1950   SpO2 100%   BMI 32.90 kg/m   Elderly adult female, interactive RRR, no murmurs, no peripheral edema Respiratory normal, lungs clear without rales or wheezes Abdomen with screams to palpation and guarding, but no rigidity or rebound no focality Attention normal, affect appropriate, judgment and insight appear normal      Data Reviewed: Discussed with GI Comprehensive metabolic panel normal CBC shows mild pancytopenia, unchanged  Family Communication: God daughter at the bedside    Disposition: Status is: Inpatient         Author: Alberteen Sam, MD 02/10/2023 2:35 PM  For on call review [email or web address].

## 2023-02-11 DIAGNOSIS — I160 Hypertensive urgency: Secondary | ICD-10-CM | POA: Diagnosis not present

## 2023-02-11 LAB — SURGICAL PATHOLOGY

## 2023-02-11 MED ORDER — FERROUS SULFATE 325 (65 FE) MG PO TABS: 325.0000 mg | ORAL_TABLET | ORAL | Status: AC

## 2023-02-11 MED ORDER — PANTOPRAZOLE SODIUM 40 MG PO TBEC: 40.0000 mg | DELAYED_RELEASE_TABLET | Freq: Two times a day (BID) | ORAL | 1 refills | Status: AC

## 2023-02-11 MED ORDER — APIXABAN 2.5 MG PO TABS
2.5000 mg | ORAL_TABLET | Freq: Two times a day (BID) | ORAL | Status: DC
  Administered 2023-02-11: 2.5 mg via ORAL
  Filled 2023-02-11: qty 1

## 2023-02-11 MED ORDER — CYANOCOBALAMIN 500 MCG PO TABS: 500.0000 ug | ORAL_TABLET | Freq: Every day | ORAL | Status: AC

## 2023-02-11 MED ORDER — SUCRALFATE 1 G PO TABS: 1.0000 g | ORAL_TABLET | Freq: Three times a day (TID) | ORAL | 1 refills | Status: AC

## 2023-02-11 MED ORDER — APIXABAN 2.5 MG PO TABS: 2.5000 mg | ORAL_TABLET | Freq: Two times a day (BID) | ORAL | 6 refills | Status: AC

## 2023-02-11 NOTE — TOC Transition Note (Addendum)
Transition of Care [facility]) - CM/SW Discharge Note   Patient Details  Name: Sherri Mcmillan MRN: [id number] Date of Birth: 10-24-1932  Transition of Care [facility]) CM/SW Contact:  Lanier Clam, RN Phone Number: 02/11/2023, 11:02 AM   Clinical Narrative:  [facility] rep [facility] accepted for HHRN-med mgmnt/HHPT/OT. No further CM needs.  -4p spoke to patient about d/c plans. Provided her with private duty resource. She will contact her god daughter on own to pick her up this evening. No further CM needs.    Final next level of care: Home w Home Health Services Barriers to Discharge: No Barriers Identified   Patient Goals and CMS Choice      Discharge Placement                         Discharge Plan and Services Additional resources added to the After Visit Summary for     Discharge Planning Services: CM Consult                      HH Arranged: RN, PT, OT [facility] Agency: [facility] Date [facility] Agency Contacted: 02/11/23 Time HH Agency Contacted: 1102 Representative spoke with at [facility] Agency: [facility] Endo  Social Determinants of Health (SDOH) Interventions SDOH Screenings   Food Insecurity: No Food Insecurity (02/03/2023)  Housing: Low Risk  (02/03/2023)  Transportation Needs: No Transportation Needs (02/03/2023)  Utilities: Not At Risk (02/03/2023)  Depression (PHQ2-9): Medium Risk (12/28/2018)  Financial Resource Strain: High Risk (09/21/2017)  Social Connections: Unknown (09/08/2021)   Received from [facility]  Tobacco Use: Medium Risk (02/10/2023)     Readmission Risk Interventions    02/04/2023   11:43 AM  Readmission Risk Prevention Plan  Post Dischage Appt Complete  Medication Screening Complete  Transportation Screening Complete

## 2023-02-11 NOTE — Care Management Important Message (Signed)
Important Message  Patient Details  Name: Sherri Mcmillan MRN: [id number] Date of Birth: [birth year]   Important Message Given:  Yes - Medicare IM (reviewed letter by phone with patient)     Corey Harold 02/11/2023, 11:56 AM

## 2023-02-11 NOTE — TOC Transition Note (Addendum)
Transition of Care [facility]) - CM/SW Discharge Note   Patient Details  Name: Sherri Mcmillan MRN: [id number] Date of Birth: [birth year]  Transition of Care [facility]) CM/SW Contact:  Lanier Clam, RN Phone Number: 02/11/2023, 10:24 AM   Clinical Narrative:  d/c home no needs or orders.  -10:33a will f/u on [facility] agency to accept for [facility] med mgnt,PT/OT.     Barriers to Discharge: No Barriers Identified   Patient Goals and CMS Choice      Discharge Placement                         Discharge Plan and Services Additional resources added to the After Visit Summary for                                       Social Determinants of Health (SDOH) Interventions SDOH Screenings   Food Insecurity: No Food Insecurity (02/03/2023)  Housing: Low Risk  (02/03/2023)  Transportation Needs: No Transportation Needs (02/03/2023)  Utilities: Not At Risk (02/03/2023)  Depression (PHQ2-9): Medium Risk (12/28/2018)  Financial Resource Strain: High Risk (09/21/2017)  Social Connections: Unknown (09/08/2021)   Received from [facility]  Tobacco Use: Medium Risk (02/10/2023)     Readmission Risk Interventions    02/04/2023   11:43 AM  Readmission Risk Prevention Plan  Post Dischage Appt Complete  Medication Screening Complete  Transportation Screening Complete

## 2023-02-11 NOTE — Discharge Summary (Signed)
Go see your primary doctor in 1 week  Go see your primary Gastroenterologist Dr. Myrtie Neither in 4-6 weeks   Increase activity slowly   Complete by: As directed        Discharge Exam: Filed Weights   02/08/23 1037 02/09/23 0459 02/10/23 0500  Weight: 51.1 kg 49 kg 48.9 kg    General: Pt is alert, awake, not in acute distress, sitting up in recliner Cardiovascular: RRR, nl S1-S2, no murmurs appreciated.   No LE edema.   Respiratory: Normal respiratory rate and rhythm.  CTAB without rales or wheezes. Abdominal: Abdomen soft and non-tender.  No distension or HSM.   Neuro/Psych: Strength symmetric in upper and lower extremities.  Judgment and insight appear at baseline.   Condition at discharge: fair  The results of significant diagnostics from this hospitalization (including imaging, microbiology, ancillary and laboratory) are listed below for reference.   Imaging Studies: CT ABDOMEN PELVIS W CONTRAST  Result Date: 02/05/2023 CLINICAL DATA:  Abdominal pain. EXAM: CT ABDOMEN AND PELVIS WITH CONTRAST TECHNIQUE: Multidetector CT imaging of the abdomen  and pelvis was performed using the standard protocol following bolus administration of intravenous contrast. RADIATION DOSE REDUCTION: This exam was performed according to the departmental dose-optimization program which includes automated exposure control, adjustment of the mA and/or kV according to patient size and/or use of iterative reconstruction technique. CONTRAST:  OMNIPAQUE IOHEXOL 300 MG/ML  SOLN COMPARISON:  CT abdomen and pelvis dated 08/05/2021. FINDINGS: Lower chest: There is a moderate hiatal hernia. Surgical clips are seen at the gastroesophageal junction. Hepatobiliary: A 1.1 cm cyst is seen in the left hepatic lobe. Gallstones are seen in the gallbladder neck. No gallbladder wall thickening or biliary dilatation. Pancreas: Unremarkable. No pancreatic ductal dilatation or surrounding inflammatory changes. Spleen: Normal in size without focal abnormality. Adrenals/Urinary Tract: Adrenal glands are unremarkable. Kidneys are normal, without renal calculi, focal lesion, or hydronephrosis. There is mild diffuse urinary bladder wall thickening, similar to prior exam. Stomach/Bowel: Stomach is within normal limits. Enteric contrast reaches the splenic flexure. Appendix is not identified and likely absent. There is colonic diverticulosis without evidence of diverticulitis. No evidence of bowel wall thickening, distention, or inflammatory changes. Vascular/Lymphatic: Aortic atherosclerosis. No enlarged abdominal or pelvic lymph nodes. Reproductive: Status post hysterectomy. No adnexal masses. Other: No abdominal wall hernia or abnormality. No abdominopelvic ascites. Musculoskeletal: Degenerative changes are seen in the spine. IMPRESSION: 1. Mild diffuse urinary bladder wall thickening is similar to prior exam. There is a nonspecific but can be seen in the setting of cystitis. 2. Moderate hiatal hernia. 3. Cholelithiasis without evidence of cholecystitis. Aortic Atherosclerosis (ICD10-I70.0).  Electronically Signed   By: Romona Curls M.D.   On: 02/05/2023 18:21   ECHOCARDIOGRAM COMPLETE  Result Date: 02/04/2023    ECHOCARDIOGRAM REPORT   Patient Name:   Sherri Mcmillan Date of Exam: 02/04/2023 Medical Rec #:  [id number]       Height:       48.0 in Accession #:    [id number]      Weight:       114.6 lb Date of Birth:  [birth year]       BSA:          1.253 m Patient Age:    89 years        BP:           120/69 mmHg Patient Gender: F               HR:  Date: 02/03/2023 CLINICAL DATA:  chest pain EXAM: CHEST - 2 VIEW COMPARISON:  Chest x-ray 06/13/2022, CT angio chest 05/01/2018 FINDINGS: The heart and mediastinal contours are unchanged. Aortic calcification. Aortic valve replacement. Flattening of the hemidiaphragms with nonspecific blunting of the costophrenic angles. No focal consolidation. No pulmonary edema. No pleural effusion. No pneumothorax. No acute osseous abnormality. IMPRESSION: 1. No active cardiopulmonary disease. 2.  Aortic Atherosclerosis (ICD10-I70.0). Electronically Signed   By: Tish Frederickson M.D.   On: 02/03/2023 02:57   VAS [place] LOWER EXTREMITY VENOUS (DVT) (7a-7p)  Result Date: 02/02/2023  Lower Venous DVT Study Patient Name:  Sherri Mcmillan  Date of Exam:   02/02/2023 Medical Rec #: [id number]        Accession #:    [id number] Date of Birth: [birth year]        Patient Gender: F Patient Age:   70 years Exam Location:  [facility] Procedure:      VAS [place] LOWER EXTREMITY VENOUS (DVT) Referring Phys: [name] --------------------------------------------------------------------------------  Indications: Edema, and Swelling.  Risk Factors: Recent extended travel. Performing Technologist: Shona Simpson  Examination Guidelines: A complete evaluation includes B-mode imaging, spectral Doppler, color Doppler, and power Doppler as needed of all accessible portions of each vessel. Bilateral testing is considered an integral part of a complete examination. Limited examinations for reoccurring indications may be performed as noted. The reflux portion of the exam is performed with the patient in reverse Trendelenburg.  +-----+---------------+---------+-----------+----------+--------------+ RIGHTCompressibilityPhasicitySpontaneityPropertiesThrombus Aging +-----+---------------+---------+-----------+----------+--------------+ CFV  Full           Yes      Yes                                  +-----+---------------+---------+-----------+----------+--------------+   +---------+---------------+---------+-----------+----------+--------------+ LEFT     CompressibilityPhasicitySpontaneityPropertiesThrombus Aging +---------+---------------+---------+-----------+----------+--------------+ CFV      Full           Yes      Yes                                 +---------+---------------+---------+-----------+----------+--------------+ SFJ      Full                                                        +---------+---------------+---------+-----------+----------+--------------+ FV Prox  Full                                                        +---------+---------------+---------+-----------+----------+--------------+ FV Mid   Full                                                        +---------+---------------+---------+-----------+----------+--------------+ FV DistalFull                                                        +---------+---------------+---------+-----------+----------+--------------+  Date: 02/03/2023 CLINICAL DATA:  chest pain EXAM: CHEST - 2 VIEW COMPARISON:  Chest x-ray 06/13/2022, CT angio chest 05/01/2018 FINDINGS: The heart and mediastinal contours are unchanged. Aortic calcification. Aortic valve replacement. Flattening of the hemidiaphragms with nonspecific blunting of the costophrenic angles. No focal consolidation. No pulmonary edema. No pleural effusion. No pneumothorax. No acute osseous abnormality. IMPRESSION: 1. No active cardiopulmonary disease. 2.  Aortic Atherosclerosis (ICD10-I70.0). Electronically Signed   By: Tish Frederickson M.D.   On: 02/03/2023 02:57   VAS [place] LOWER EXTREMITY VENOUS (DVT) (7a-7p)  Result Date: 02/02/2023  Lower Venous DVT Study Patient Name:  Sherri Mcmillan  Date of Exam:   02/02/2023 Medical Rec #: [id number]        Accession #:    [id number] Date of Birth: [birth year]        Patient Gender: F Patient Age:   70 years Exam Location:  [facility] Procedure:      VAS [place] LOWER EXTREMITY VENOUS (DVT) Referring Phys: [name] --------------------------------------------------------------------------------  Indications: Edema, and Swelling.  Risk Factors: Recent extended travel. Performing Technologist: Shona Simpson  Examination Guidelines: A complete evaluation includes B-mode imaging, spectral Doppler, color Doppler, and power Doppler as needed of all accessible portions of each vessel. Bilateral testing is considered an integral part of a complete examination. Limited examinations for reoccurring indications may be performed as noted. The reflux portion of the exam is performed with the patient in reverse Trendelenburg.  +-----+---------------+---------+-----------+----------+--------------+ RIGHTCompressibilityPhasicitySpontaneityPropertiesThrombus Aging +-----+---------------+---------+-----------+----------+--------------+ CFV  Full           Yes      Yes                                  +-----+---------------+---------+-----------+----------+--------------+   +---------+---------------+---------+-----------+----------+--------------+ LEFT     CompressibilityPhasicitySpontaneityPropertiesThrombus Aging +---------+---------------+---------+-----------+----------+--------------+ CFV      Full           Yes      Yes                                 +---------+---------------+---------+-----------+----------+--------------+ SFJ      Full                                                        +---------+---------------+---------+-----------+----------+--------------+ FV Prox  Full                                                        +---------+---------------+---------+-----------+----------+--------------+ FV Mid   Full                                                        +---------+---------------+---------+-----------+----------+--------------+ FV DistalFull                                                        +---------+---------------+---------+-----------+----------+--------------+  Go see your primary doctor in 1 week  Go see your primary Gastroenterologist Dr. Myrtie Neither in 4-6 weeks   Increase activity slowly   Complete by: As directed        Discharge Exam: Filed Weights   02/08/23 1037 02/09/23 0459 02/10/23 0500  Weight: 51.1 kg 49 kg 48.9 kg    General: Pt is alert, awake, not in acute distress, sitting up in recliner Cardiovascular: RRR, nl S1-S2, no murmurs appreciated.   No LE edema.   Respiratory: Normal respiratory rate and rhythm.  CTAB without rales or wheezes. Abdominal: Abdomen soft and non-tender.  No distension or HSM.   Neuro/Psych: Strength symmetric in upper and lower extremities.  Judgment and insight appear at baseline.   Condition at discharge: fair  The results of significant diagnostics from this hospitalization (including imaging, microbiology, ancillary and laboratory) are listed below for reference.   Imaging Studies: CT ABDOMEN PELVIS W CONTRAST  Result Date: 02/05/2023 CLINICAL DATA:  Abdominal pain. EXAM: CT ABDOMEN AND PELVIS WITH CONTRAST TECHNIQUE: Multidetector CT imaging of the abdomen  and pelvis was performed using the standard protocol following bolus administration of intravenous contrast. RADIATION DOSE REDUCTION: This exam was performed according to the departmental dose-optimization program which includes automated exposure control, adjustment of the mA and/or kV according to patient size and/or use of iterative reconstruction technique. CONTRAST:  OMNIPAQUE IOHEXOL 300 MG/ML  SOLN COMPARISON:  CT abdomen and pelvis dated 08/05/2021. FINDINGS: Lower chest: There is a moderate hiatal hernia. Surgical clips are seen at the gastroesophageal junction. Hepatobiliary: A 1.1 cm cyst is seen in the left hepatic lobe. Gallstones are seen in the gallbladder neck. No gallbladder wall thickening or biliary dilatation. Pancreas: Unremarkable. No pancreatic ductal dilatation or surrounding inflammatory changes. Spleen: Normal in size without focal abnormality. Adrenals/Urinary Tract: Adrenal glands are unremarkable. Kidneys are normal, without renal calculi, focal lesion, or hydronephrosis. There is mild diffuse urinary bladder wall thickening, similar to prior exam. Stomach/Bowel: Stomach is within normal limits. Enteric contrast reaches the splenic flexure. Appendix is not identified and likely absent. There is colonic diverticulosis without evidence of diverticulitis. No evidence of bowel wall thickening, distention, or inflammatory changes. Vascular/Lymphatic: Aortic atherosclerosis. No enlarged abdominal or pelvic lymph nodes. Reproductive: Status post hysterectomy. No adnexal masses. Other: No abdominal wall hernia or abnormality. No abdominopelvic ascites. Musculoskeletal: Degenerative changes are seen in the spine. IMPRESSION: 1. Mild diffuse urinary bladder wall thickening is similar to prior exam. There is a nonspecific but can be seen in the setting of cystitis. 2. Moderate hiatal hernia. 3. Cholelithiasis without evidence of cholecystitis. Aortic Atherosclerosis (ICD10-I70.0).  Electronically Signed   By: Romona Curls M.D.   On: 02/05/2023 18:21   ECHOCARDIOGRAM COMPLETE  Result Date: 02/04/2023    ECHOCARDIOGRAM REPORT   Patient Name:   Sherri Mcmillan Date of Exam: 02/04/2023 Medical Rec #:  [id number]       Height:       48.0 in Accession #:    [id number]      Weight:       114.6 lb Date of Birth:  [birth year]       BSA:          1.253 m Patient Age:    89 years        BP:           120/69 mmHg Patient Gender: F               HR:  Go see your primary doctor in 1 week  Go see your primary Gastroenterologist Dr. Myrtie Neither in 4-6 weeks   Increase activity slowly   Complete by: As directed        Discharge Exam: Filed Weights   02/08/23 1037 02/09/23 0459 02/10/23 0500  Weight: 51.1 kg 49 kg 48.9 kg    General: Pt is alert, awake, not in acute distress, sitting up in recliner Cardiovascular: RRR, nl S1-S2, no murmurs appreciated.   No LE edema.   Respiratory: Normal respiratory rate and rhythm.  CTAB without rales or wheezes. Abdominal: Abdomen soft and non-tender.  No distension or HSM.   Neuro/Psych: Strength symmetric in upper and lower extremities.  Judgment and insight appear at baseline.   Condition at discharge: fair  The results of significant diagnostics from this hospitalization (including imaging, microbiology, ancillary and laboratory) are listed below for reference.   Imaging Studies: CT ABDOMEN PELVIS W CONTRAST  Result Date: 02/05/2023 CLINICAL DATA:  Abdominal pain. EXAM: CT ABDOMEN AND PELVIS WITH CONTRAST TECHNIQUE: Multidetector CT imaging of the abdomen  and pelvis was performed using the standard protocol following bolus administration of intravenous contrast. RADIATION DOSE REDUCTION: This exam was performed according to the departmental dose-optimization program which includes automated exposure control, adjustment of the mA and/or kV according to patient size and/or use of iterative reconstruction technique. CONTRAST:  OMNIPAQUE IOHEXOL 300 MG/ML  SOLN COMPARISON:  CT abdomen and pelvis dated 08/05/2021. FINDINGS: Lower chest: There is a moderate hiatal hernia. Surgical clips are seen at the gastroesophageal junction. Hepatobiliary: A 1.1 cm cyst is seen in the left hepatic lobe. Gallstones are seen in the gallbladder neck. No gallbladder wall thickening or biliary dilatation. Pancreas: Unremarkable. No pancreatic ductal dilatation or surrounding inflammatory changes. Spleen: Normal in size without focal abnormality. Adrenals/Urinary Tract: Adrenal glands are unremarkable. Kidneys are normal, without renal calculi, focal lesion, or hydronephrosis. There is mild diffuse urinary bladder wall thickening, similar to prior exam. Stomach/Bowel: Stomach is within normal limits. Enteric contrast reaches the splenic flexure. Appendix is not identified and likely absent. There is colonic diverticulosis without evidence of diverticulitis. No evidence of bowel wall thickening, distention, or inflammatory changes. Vascular/Lymphatic: Aortic atherosclerosis. No enlarged abdominal or pelvic lymph nodes. Reproductive: Status post hysterectomy. No adnexal masses. Other: No abdominal wall hernia or abnormality. No abdominopelvic ascites. Musculoskeletal: Degenerative changes are seen in the spine. IMPRESSION: 1. Mild diffuse urinary bladder wall thickening is similar to prior exam. There is a nonspecific but can be seen in the setting of cystitis. 2. Moderate hiatal hernia. 3. Cholelithiasis without evidence of cholecystitis. Aortic Atherosclerosis (ICD10-I70.0).  Electronically Signed   By: Romona Curls M.D.   On: 02/05/2023 18:21   ECHOCARDIOGRAM COMPLETE  Result Date: 02/04/2023    ECHOCARDIOGRAM REPORT   Patient Name:   Sherri Mcmillan Date of Exam: 02/04/2023 Medical Rec #:  [id number]       Height:       48.0 in Accession #:    [id number]      Weight:       114.6 lb Date of Birth:  [birth year]       BSA:          1.253 m Patient Age:    89 years        BP:           120/69 mmHg Patient Gender: F               HR:  Physician Discharge Summary   Patient: Sherri Mcmillan MRN: [id number] DOB: [birth year]  Admit date:     02/02/2023  Discharge date: 02/11/23  Discharge Physician: Alberteen Sam   PCP: Cain Saupe, MD     Recommendations at discharge:  Follow up with PCP [facility] in 1 week [employer]: Please obtain BMP and CBC in 1 week Follow up mild pancytopenia pending goals of care Please note lowered dose of apixaban for age and weight.  Follow up with Gastroenterology in 4-6 weeks      Discharge Diagnoses: Principal Problem:   Hypertensive urgency Active Problems:   Abdominal pain, bilateral upper quadrant   Anxiety and depression   Hypokalemia   S/P TAVR (transcatheter aortic valve replacement)   Hiatal hernia with GERD   Pancytopenia (HCC)   Paroxysmal atrial fibrillation   Obesity (BMI 30-39.9)   Gastritis and gastroduodenitis   Chronic abdominal pain      Hospital Course: Mrs. Diggs is an 89 y.o. F with HTN, AS s/p TAVR, CAD no recent PCI, pAF not on AC, hiatal hernia and anxiety/depression who presented with 2 weeks leg swelling, DOE, chest burning.  In the ER, ECG and troponins normal.  [place] negative for DVT.  BP 236/113 mmHg, BNP 250 and so she was admitted for hypertensive urgency, stated on furosemide and hydralazine and amlodipine.       * Hypertensive urgency Presented with BP >200/100, started on antihypertensives and admitted.  Initially hydralazine and amlodipine were added, but within a few days, these had to be canceled and for the last 4 days, patient's BP was normal 110s to 120s on metoprolol 25 mg once daily alone.     Cardiology consulted.  Troponins and ECG normal.  Echo without RWMA, change in EF, change in TAVR.  Infarction and ischemia ruled out.  CHF ruled out.      Abdominal pain, bilateral upper quadrant Per report, this is chronic.  After resolution of her hypertension, patient reported daily severe pains in the chest, and so  work up was undertaken.   Unfortunately, patient is unable to localize her pain (it is sometimes in her back, sometimes in her sternum, sometimes her upper abdomen).  She and family are also unable to identify any provoking factors either (not clearly exacerbated by eating, exertion, position, seems to come and go spontaneously).  Chest imaging normal.  CT abdomen and pelvis unremarkable.  Echo unchanged from previous.  Troponins and ECG unremarkable.  Serum chemistries unrevealing.  GI were consulted, and they performed EGD that showed mild gastritis and known hiatal hernia only.  They suspected functional abdominal pain.  They offered Levsin, which was trialed in the hospital without relief.     Given her age, I recommend against anticholinergics.  Obesity (BMI 30-39.9) BMI 34  Paroxysmal atrial fibrillation On metoprolol, Eliquis, dose reduced due to age, renal clearance, weight.  Pancytopenia (HCC) Iron deficiency anemia due to unknown cause Mild.  Probably iron deficiency superimposed on chronic mild leukopenia and mild thrombocytopenia.  Defer work up to PCP - Defer work up to PCP  Hiatal hernia with GERD On PPI, Carafate.  Hypokalemia Resolved  Anxiety and depression On Atarax, quetiapine            The [facility] Controlled Substances Registry was reviewed for this patient prior to discharge.  Consultants: Gastroenterology, Cardiology  Procedures performed:  Echocardiogram EGD CT abdomen and pelvis   Disposition: Home health Diet recommendation:  Cardiac diet  Go see your primary doctor in 1 week  Go see your primary Gastroenterologist Dr. Myrtie Neither in 4-6 weeks   Increase activity slowly   Complete by: As directed        Discharge Exam: Filed Weights   02/08/23 1037 02/09/23 0459 02/10/23 0500  Weight: 51.1 kg 49 kg 48.9 kg    General: Pt is alert, awake, not in acute distress, sitting up in recliner Cardiovascular: RRR, nl S1-S2, no murmurs appreciated.   No LE edema.   Respiratory: Normal respiratory rate and rhythm.  CTAB without rales or wheezes. Abdominal: Abdomen soft and non-tender.  No distension or HSM.   Neuro/Psych: Strength symmetric in upper and lower extremities.  Judgment and insight appear at baseline.   Condition at discharge: fair  The results of significant diagnostics from this hospitalization (including imaging, microbiology, ancillary and laboratory) are listed below for reference.   Imaging Studies: CT ABDOMEN PELVIS W CONTRAST  Result Date: 02/05/2023 CLINICAL DATA:  Abdominal pain. EXAM: CT ABDOMEN AND PELVIS WITH CONTRAST TECHNIQUE: Multidetector CT imaging of the abdomen  and pelvis was performed using the standard protocol following bolus administration of intravenous contrast. RADIATION DOSE REDUCTION: This exam was performed according to the departmental dose-optimization program which includes automated exposure control, adjustment of the mA and/or kV according to patient size and/or use of iterative reconstruction technique. CONTRAST:  OMNIPAQUE IOHEXOL 300 MG/ML  SOLN COMPARISON:  CT abdomen and pelvis dated 08/05/2021. FINDINGS: Lower chest: There is a moderate hiatal hernia. Surgical clips are seen at the gastroesophageal junction. Hepatobiliary: A 1.1 cm cyst is seen in the left hepatic lobe. Gallstones are seen in the gallbladder neck. No gallbladder wall thickening or biliary dilatation. Pancreas: Unremarkable. No pancreatic ductal dilatation or surrounding inflammatory changes. Spleen: Normal in size without focal abnormality. Adrenals/Urinary Tract: Adrenal glands are unremarkable. Kidneys are normal, without renal calculi, focal lesion, or hydronephrosis. There is mild diffuse urinary bladder wall thickening, similar to prior exam. Stomach/Bowel: Stomach is within normal limits. Enteric contrast reaches the splenic flexure. Appendix is not identified and likely absent. There is colonic diverticulosis without evidence of diverticulitis. No evidence of bowel wall thickening, distention, or inflammatory changes. Vascular/Lymphatic: Aortic atherosclerosis. No enlarged abdominal or pelvic lymph nodes. Reproductive: Status post hysterectomy. No adnexal masses. Other: No abdominal wall hernia or abnormality. No abdominopelvic ascites. Musculoskeletal: Degenerative changes are seen in the spine. IMPRESSION: 1. Mild diffuse urinary bladder wall thickening is similar to prior exam. There is a nonspecific but can be seen in the setting of cystitis. 2. Moderate hiatal hernia. 3. Cholelithiasis without evidence of cholecystitis. Aortic Atherosclerosis (ICD10-I70.0).  Electronically Signed   By: Romona Curls M.D.   On: 02/05/2023 18:21   ECHOCARDIOGRAM COMPLETE  Result Date: 02/04/2023    ECHOCARDIOGRAM REPORT   Patient Name:   Sherri Mcmillan Date of Exam: 02/04/2023 Medical Rec #:  [id number]       Height:       48.0 in Accession #:    [id number]      Weight:       114.6 lb Date of Birth:  [birth year]       BSA:          1.253 m Patient Age:    89 years        BP:           120/69 mmHg Patient Gender: F               HR:  Date: 02/03/2023 CLINICAL DATA:  chest pain EXAM: CHEST - 2 VIEW COMPARISON:  Chest x-ray 06/13/2022, CT angio chest 05/01/2018 FINDINGS: The heart and mediastinal contours are unchanged. Aortic calcification. Aortic valve replacement. Flattening of the hemidiaphragms with nonspecific blunting of the costophrenic angles. No focal consolidation. No pulmonary edema. No pleural effusion. No pneumothorax. No acute osseous abnormality. IMPRESSION: 1. No active cardiopulmonary disease. 2.  Aortic Atherosclerosis (ICD10-I70.0). Electronically Signed   By: Tish Frederickson M.D.   On: 02/03/2023 02:57   VAS [place] LOWER EXTREMITY VENOUS (DVT) (7a-7p)  Result Date: 02/02/2023  Lower Venous DVT Study Patient Name:  Sherri Mcmillan  Date of Exam:   02/02/2023 Medical Rec #: [id number]        Accession #:    [id number] Date of Birth: [birth year]        Patient Gender: F Patient Age:   70 years Exam Location:  [facility] Procedure:      VAS [place] LOWER EXTREMITY VENOUS (DVT) Referring Phys: [name] --------------------------------------------------------------------------------  Indications: Edema, and Swelling.  Risk Factors: Recent extended travel. Performing Technologist: Shona Simpson  Examination Guidelines: A complete evaluation includes B-mode imaging, spectral Doppler, color Doppler, and power Doppler as needed of all accessible portions of each vessel. Bilateral testing is considered an integral part of a complete examination. Limited examinations for reoccurring indications may be performed as noted. The reflux portion of the exam is performed with the patient in reverse Trendelenburg.  +-----+---------------+---------+-----------+----------+--------------+ RIGHTCompressibilityPhasicitySpontaneityPropertiesThrombus Aging +-----+---------------+---------+-----------+----------+--------------+ CFV  Full           Yes      Yes                                  +-----+---------------+---------+-----------+----------+--------------+   +---------+---------------+---------+-----------+----------+--------------+ LEFT     CompressibilityPhasicitySpontaneityPropertiesThrombus Aging +---------+---------------+---------+-----------+----------+--------------+ CFV      Full           Yes      Yes                                 +---------+---------------+---------+-----------+----------+--------------+ SFJ      Full                                                        +---------+---------------+---------+-----------+----------+--------------+ FV Prox  Full                                                        +---------+---------------+---------+-----------+----------+--------------+ FV Mid   Full                                                        +---------+---------------+---------+-----------+----------+--------------+ FV DistalFull                                                        +---------+---------------+---------+-----------+----------+--------------+

## 2023-02-11 NOTE — Plan of Care (Signed)

## 2023-02-11 NOTE — Plan of Care (Signed)
CH[facility] Tonsillectomy/Adenoidectomy, Postoperative PEDS care plan entered in error.

## 2023-02-12 DIAGNOSIS — I10 Essential (primary) hypertension: Secondary | ICD-10-CM | POA: Diagnosis not present

## 2023-02-13 ENCOUNTER — Encounter (HOSPITAL_COMMUNITY): Payer: Self-pay | Admitting: Gastroenterology

## 2023-02-15 DIAGNOSIS — K3184 Gastroparesis: Secondary | ICD-10-CM | POA: Diagnosis not present

## 2023-02-15 DIAGNOSIS — D509 Iron deficiency anemia, unspecified: Secondary | ICD-10-CM | POA: Diagnosis not present

## 2023-02-15 DIAGNOSIS — Z7901 Long term (current) use of anticoagulants: Secondary | ICD-10-CM | POA: Diagnosis not present

## 2023-02-15 DIAGNOSIS — I10 Essential (primary) hypertension: Secondary | ICD-10-CM | POA: Diagnosis not present

## 2023-02-15 DIAGNOSIS — K2270 Barrett's esophagus without dysplasia: Secondary | ICD-10-CM | POA: Diagnosis not present

## 2023-02-15 DIAGNOSIS — H409 Unspecified glaucoma: Secondary | ICD-10-CM | POA: Diagnosis not present

## 2023-02-15 DIAGNOSIS — D61818 Other pancytopenia: Secondary | ICD-10-CM | POA: Diagnosis not present

## 2023-02-15 DIAGNOSIS — R109 Unspecified abdominal pain: Secondary | ICD-10-CM | POA: Diagnosis not present

## 2023-02-15 DIAGNOSIS — E785 Hyperlipidemia, unspecified: Secondary | ICD-10-CM | POA: Diagnosis not present

## 2023-02-15 DIAGNOSIS — G8929 Other chronic pain: Secondary | ICD-10-CM | POA: Diagnosis not present

## 2023-02-15 DIAGNOSIS — G43909 Migraine, unspecified, not intractable, without status migrainosus: Secondary | ICD-10-CM | POA: Diagnosis not present

## 2023-02-15 DIAGNOSIS — R911 Solitary pulmonary nodule: Secondary | ICD-10-CM | POA: Diagnosis not present

## 2023-02-15 DIAGNOSIS — I480 Paroxysmal atrial fibrillation: Secondary | ICD-10-CM | POA: Diagnosis not present

## 2023-02-15 DIAGNOSIS — K219 Gastro-esophageal reflux disease without esophagitis: Secondary | ICD-10-CM | POA: Diagnosis not present

## 2023-02-15 DIAGNOSIS — Z79899 Other long term (current) drug therapy: Secondary | ICD-10-CM | POA: Diagnosis not present

## 2023-02-15 DIAGNOSIS — I160 Hypertensive urgency: Secondary | ICD-10-CM | POA: Diagnosis not present

## 2023-02-15 DIAGNOSIS — G629 Polyneuropathy, unspecified: Secondary | ICD-10-CM | POA: Diagnosis not present

## 2023-02-15 DIAGNOSIS — K2990 Gastroduodenitis, unspecified, without bleeding: Secondary | ICD-10-CM | POA: Diagnosis not present

## 2023-02-16 DIAGNOSIS — Z7189 Other specified counseling: Secondary | ICD-10-CM | POA: Diagnosis not present

## 2023-02-16 DIAGNOSIS — I130 Hypertensive heart and chronic kidney disease with heart failure and stage 1 through stage 4 chronic kidney disease, or unspecified chronic kidney disease: Secondary | ICD-10-CM | POA: Diagnosis not present

## 2023-02-16 DIAGNOSIS — E559 Vitamin D deficiency, unspecified: Secondary | ICD-10-CM | POA: Diagnosis not present

## 2023-02-16 DIAGNOSIS — H409 Unspecified glaucoma: Secondary | ICD-10-CM | POA: Diagnosis not present

## 2023-02-16 DIAGNOSIS — I509 Heart failure, unspecified: Secondary | ICD-10-CM | POA: Diagnosis not present

## 2023-02-16 DIAGNOSIS — D6869 Other thrombophilia: Secondary | ICD-10-CM | POA: Diagnosis not present

## 2023-02-16 DIAGNOSIS — Z0000 Encounter for general adult medical examination without abnormal findings: Secondary | ICD-10-CM | POA: Diagnosis not present

## 2023-02-16 DIAGNOSIS — K2970 Gastritis, unspecified, without bleeding: Secondary | ICD-10-CM | POA: Diagnosis not present

## 2023-02-16 DIAGNOSIS — G8929 Other chronic pain: Secondary | ICD-10-CM | POA: Diagnosis not present

## 2023-02-16 DIAGNOSIS — R600 Localized edema: Secondary | ICD-10-CM | POA: Diagnosis not present

## 2023-02-16 DIAGNOSIS — I480 Paroxysmal atrial fibrillation: Secondary | ICD-10-CM | POA: Diagnosis not present

## 2023-02-16 DIAGNOSIS — Z79899 Other long term (current) drug therapy: Secondary | ICD-10-CM | POA: Diagnosis not present

## 2023-02-17 ENCOUNTER — Encounter: Payer: Self-pay | Admitting: Gastroenterology

## 2023-02-17 DIAGNOSIS — G8929 Other chronic pain: Secondary | ICD-10-CM | POA: Diagnosis not present

## 2023-02-17 DIAGNOSIS — D509 Iron deficiency anemia, unspecified: Secondary | ICD-10-CM | POA: Diagnosis not present

## 2023-02-17 DIAGNOSIS — K2990 Gastroduodenitis, unspecified, without bleeding: Secondary | ICD-10-CM | POA: Diagnosis not present

## 2023-02-17 DIAGNOSIS — G629 Polyneuropathy, unspecified: Secondary | ICD-10-CM | POA: Diagnosis not present

## 2023-02-17 DIAGNOSIS — Z7901 Long term (current) use of anticoagulants: Secondary | ICD-10-CM | POA: Diagnosis not present

## 2023-02-17 DIAGNOSIS — H409 Unspecified glaucoma: Secondary | ICD-10-CM | POA: Diagnosis not present

## 2023-02-17 DIAGNOSIS — Z79899 Other long term (current) drug therapy: Secondary | ICD-10-CM | POA: Diagnosis not present

## 2023-02-17 DIAGNOSIS — K3184 Gastroparesis: Secondary | ICD-10-CM | POA: Diagnosis not present

## 2023-02-17 DIAGNOSIS — R911 Solitary pulmonary nodule: Secondary | ICD-10-CM | POA: Diagnosis not present

## 2023-02-17 DIAGNOSIS — D61818 Other pancytopenia: Secondary | ICD-10-CM | POA: Diagnosis not present

## 2023-02-17 DIAGNOSIS — G43909 Migraine, unspecified, not intractable, without status migrainosus: Secondary | ICD-10-CM | POA: Diagnosis not present

## 2023-02-17 DIAGNOSIS — I480 Paroxysmal atrial fibrillation: Secondary | ICD-10-CM | POA: Diagnosis not present

## 2023-02-17 DIAGNOSIS — E785 Hyperlipidemia, unspecified: Secondary | ICD-10-CM | POA: Diagnosis not present

## 2023-02-17 DIAGNOSIS — K2270 Barrett's esophagus without dysplasia: Secondary | ICD-10-CM | POA: Diagnosis not present

## 2023-02-17 DIAGNOSIS — I160 Hypertensive urgency: Secondary | ICD-10-CM | POA: Diagnosis not present

## 2023-02-17 DIAGNOSIS — I10 Essential (primary) hypertension: Secondary | ICD-10-CM | POA: Diagnosis not present

## 2023-02-17 DIAGNOSIS — R109 Unspecified abdominal pain: Secondary | ICD-10-CM | POA: Diagnosis not present

## 2023-02-17 DIAGNOSIS — K219 Gastro-esophageal reflux disease without esophagitis: Secondary | ICD-10-CM | POA: Diagnosis not present

## 2023-02-23 DIAGNOSIS — K3184 Gastroparesis: Secondary | ICD-10-CM | POA: Diagnosis not present

## 2023-02-23 DIAGNOSIS — Z79899 Other long term (current) drug therapy: Secondary | ICD-10-CM | POA: Diagnosis not present

## 2023-02-23 DIAGNOSIS — I480 Paroxysmal atrial fibrillation: Secondary | ICD-10-CM | POA: Diagnosis not present

## 2023-02-23 DIAGNOSIS — G8929 Other chronic pain: Secondary | ICD-10-CM | POA: Diagnosis not present

## 2023-02-23 DIAGNOSIS — R109 Unspecified abdominal pain: Secondary | ICD-10-CM | POA: Diagnosis not present

## 2023-02-23 DIAGNOSIS — K219 Gastro-esophageal reflux disease without esophagitis: Secondary | ICD-10-CM | POA: Diagnosis not present

## 2023-02-23 DIAGNOSIS — I10 Essential (primary) hypertension: Secondary | ICD-10-CM | POA: Diagnosis not present

## 2023-02-23 DIAGNOSIS — K2270 Barrett's esophagus without dysplasia: Secondary | ICD-10-CM | POA: Diagnosis not present

## 2023-02-23 DIAGNOSIS — E785 Hyperlipidemia, unspecified: Secondary | ICD-10-CM | POA: Diagnosis not present

## 2023-02-23 DIAGNOSIS — Z7901 Long term (current) use of anticoagulants: Secondary | ICD-10-CM | POA: Diagnosis not present

## 2023-02-23 DIAGNOSIS — K2990 Gastroduodenitis, unspecified, without bleeding: Secondary | ICD-10-CM | POA: Diagnosis not present

## 2023-02-23 DIAGNOSIS — R911 Solitary pulmonary nodule: Secondary | ICD-10-CM | POA: Diagnosis not present

## 2023-02-23 DIAGNOSIS — D61818 Other pancytopenia: Secondary | ICD-10-CM | POA: Diagnosis not present

## 2023-02-23 DIAGNOSIS — I160 Hypertensive urgency: Secondary | ICD-10-CM | POA: Diagnosis not present

## 2023-02-23 DIAGNOSIS — H409 Unspecified glaucoma: Secondary | ICD-10-CM | POA: Diagnosis not present

## 2023-02-23 DIAGNOSIS — G629 Polyneuropathy, unspecified: Secondary | ICD-10-CM | POA: Diagnosis not present

## 2023-02-23 DIAGNOSIS — G43909 Migraine, unspecified, not intractable, without status migrainosus: Secondary | ICD-10-CM | POA: Diagnosis not present

## 2023-02-23 DIAGNOSIS — D509 Iron deficiency anemia, unspecified: Secondary | ICD-10-CM | POA: Diagnosis not present

## 2023-02-24 ENCOUNTER — Telehealth: Payer: Self-pay

## 2023-02-24 DIAGNOSIS — Z79899 Other long term (current) drug therapy: Secondary | ICD-10-CM | POA: Diagnosis not present

## 2023-02-24 DIAGNOSIS — K219 Gastro-esophageal reflux disease without esophagitis: Secondary | ICD-10-CM | POA: Diagnosis not present

## 2023-02-24 DIAGNOSIS — H4089 Other specified glaucoma: Secondary | ICD-10-CM | POA: Diagnosis not present

## 2023-02-24 DIAGNOSIS — Z7901 Long term (current) use of anticoagulants: Secondary | ICD-10-CM | POA: Diagnosis not present

## 2023-02-24 DIAGNOSIS — H409 Unspecified glaucoma: Secondary | ICD-10-CM | POA: Diagnosis not present

## 2023-02-24 DIAGNOSIS — R109 Unspecified abdominal pain: Secondary | ICD-10-CM | POA: Diagnosis not present

## 2023-02-24 DIAGNOSIS — K3184 Gastroparesis: Secondary | ICD-10-CM | POA: Diagnosis not present

## 2023-02-24 DIAGNOSIS — K2990 Gastroduodenitis, unspecified, without bleeding: Secondary | ICD-10-CM | POA: Diagnosis not present

## 2023-02-24 DIAGNOSIS — G8929 Other chronic pain: Secondary | ICD-10-CM | POA: Diagnosis not present

## 2023-02-24 DIAGNOSIS — G43909 Migraine, unspecified, not intractable, without status migrainosus: Secondary | ICD-10-CM | POA: Diagnosis not present

## 2023-02-24 DIAGNOSIS — I160 Hypertensive urgency: Secondary | ICD-10-CM | POA: Diagnosis not present

## 2023-02-24 DIAGNOSIS — G629 Polyneuropathy, unspecified: Secondary | ICD-10-CM | POA: Diagnosis not present

## 2023-02-24 DIAGNOSIS — K2270 Barrett's esophagus without dysplasia: Secondary | ICD-10-CM | POA: Diagnosis not present

## 2023-02-24 DIAGNOSIS — I10 Essential (primary) hypertension: Secondary | ICD-10-CM | POA: Diagnosis not present

## 2023-02-24 DIAGNOSIS — R911 Solitary pulmonary nodule: Secondary | ICD-10-CM | POA: Diagnosis not present

## 2023-02-24 DIAGNOSIS — I480 Paroxysmal atrial fibrillation: Secondary | ICD-10-CM | POA: Diagnosis not present

## 2023-02-24 DIAGNOSIS — E785 Hyperlipidemia, unspecified: Secondary | ICD-10-CM | POA: Diagnosis not present

## 2023-02-24 DIAGNOSIS — D509 Iron deficiency anemia, unspecified: Secondary | ICD-10-CM | POA: Diagnosis not present

## 2023-02-24 DIAGNOSIS — D61818 Other pancytopenia: Secondary | ICD-10-CM | POA: Diagnosis not present

## 2023-02-24 NOTE — Telephone Encounter (Signed)
-----   Message -----  From: May, Deanna J, NP  Sent: 02/10/2023  12:51 PM EDT  To: Missy Sabins, RN  Subject: Follow-up                                      [place],   Patient needs follow-up with dr. Myrtie Neither or first available APP in 6 weeks from hospitalization.   Thank you,   Deanna and [employer]

## 2023-02-24 NOTE — Telephone Encounter (Signed)
Scheduled OV with patient for 12/6 AT 3:00 PM with [place], PA. Appointment reminder form mailed home as well.

## 2023-04-01 ENCOUNTER — Telehealth: Payer: Self-pay | Admitting: Gastroenterology

## 2023-04-01 ENCOUNTER — Ambulatory Visit: Payer: 59 | Admitting: Gastroenterology

## 2023-04-01 NOTE — Telephone Encounter (Signed)
Patient called and stated they actually showed up at a different location for her appointment thinking we where located at [facility]. Patient was reschedule for 06/17/2023 at 3:00 pm. Patient also stated that she wanted to know if she will be charged a no show fee even though she tried to make her appointment she had today. Please advise.

## 2023-04-01 NOTE — Telephone Encounter (Signed)
Bayley- FYI

## 2023-05-06 NOTE — Telephone Encounter (Signed)
 PT was charged $50 for missing an appointment that wasn't her fault. She said that she's elderly and was confused on where she was. She would like to see if the doctor would reconsider charging the no show fee. Please advise.

## 2023-05-06 NOTE — Telephone Encounter (Signed)
 Sherri Mcmillan, can you help me discern whether this 50 dollar "no show" fee came from GI or another [place] office?

## 2023-06-17 ENCOUNTER — Ambulatory Visit: Payer: 59 | Admitting: Gastroenterology

## 2023-06-17 ENCOUNTER — Encounter: Payer: Self-pay | Admitting: Gastroenterology

## 2023-06-17 VITALS — BP 130/88 | HR 68 | Ht <= 58 in | Wt 108.0 lb

## 2023-06-17 DIAGNOSIS — R1010 Upper abdominal pain, unspecified: Secondary | ICD-10-CM

## 2023-06-17 DIAGNOSIS — K219 Gastro-esophageal reflux disease without esophagitis: Secondary | ICD-10-CM

## 2023-06-17 DIAGNOSIS — M940 Chondrocostal junction syndrome [Tietze]: Secondary | ICD-10-CM

## 2023-06-17 NOTE — Progress Notes (Signed)
 Chief Complaint: Hospital follow-up Primary GI MD: Dr. Myrtie Neither  HPI: 89 y.o. F with HTN, AS s/p TAVR, CAD no recent PCI, pAF on Eliquis, hiatal hernia and anxiety/depression presents for hospital follow-up  Admitted 02/02/2023 to 02/11/2023 with hypertensive urgency.  Cardiology was consulted.  Troponin and EKG was normal.  Echocardiogram 02/04/2023 with a EF 60 to 65%.  Patient also reported chronic upper abdominal pain with unremarkable CT abdomen pelvis.  Patient subsequently underwent EGD which showed mild gastritis and hiatal hernia.  She was given Levsin for suspected functional abdominal pain which did not resolve her symptoms. Large hiatal hernia thought to be playing a role in her symptoms but she is not a surgical candidate.  Abdominal pain was thought to be functional and TCA was considered but avoided due to her age and medical comorbidities  Last labs 02/10/2023 with stable chronic normocytic anemia with hemoglobin 10.3 and normal CMP  Of note, patient was last seen in 2019 by Dr. Myrtie Neither and at that time she was having heartburn, regurgitation, and nausea with incomplete symptom relief from high-dose PPI and trial of metoclopramide.  Previous workup includes pH and impedance testing showing significant reflux and esophageal manometry showing mild decrease in distal contractility.  She does have a history of fundoplication and hernia repair performed in early 2015 by Dr. Wenda Low  -----------------------TODAY--------------------- Discussed the use of AI scribe software for clinical note transcription with the patient, who gave verbal consent to proceed.  History of Present Illness   Sherri Mcmillan is an 89 year old female who presents with persistent upper abdominal pain.  She experiences persistent upper abdominal pain, describing the pain as intermittent and sometimes wrapping around to her left side. The pain is not related to eating or bowel movements and occurs unexpectedly.  It can be exacerbated by breathing, although deep breaths tend to minimize it. The pain sometimes feels like a 'knot' in her upper abdomen, which she can feel moving in and out.  She is currently taking pantoprazole twice a day and Carafate four times a day, but continues to experience symptoms. She has tried Levsin with no relief. Ibuprofen, and Tylenol, with only minimal relief. Ibuprofen and Tylenol provide temporary relief, but the pain can be severe at times and absent for one or two days.  She has undergone a CT scan and an EGD, and has been on multiple GI medications without significant improvement.  She experiences significant sleep disturbances, often waking up throughout the night, and only manages to sleep for about an hour at a time. She is very active during the day and finds it difficult to rest, which she believes contributes to her lack of sleep.  No changes in pain with bowel movements or regular physical movement. Regular bowel movements and no constipation.     PREVIOUS GI WORKUP   EGD inpatient for upper abdominal pain 02/02/2023 - Tortuous esophagus.  - Esophageal mucosal changes consistent with short- segment Barrett' s esophagus. Biopsied.  - No other gross lesions in the entire esophagus.  - Medium- sized hiatal hernia.  - J- shaped deformity.  - Gastritis. Biopsied.  - No gross lesions in the duodenal bulb, in the first portion of the duodenum and in the second portion of the duodenum. Biopsied.  A. GASTRIC, BIOPSY:  - Gastric antral and oxyntic mucosa with mild reactive changes  - Negative for H. pylori on HE stain  - Negative for intestinal metaplasia or malignancy   B. DUODENAL, BIOPSY:  -  Benign small bowel mucosa with no significant pathologic changes   C. ESOPHAGUS, DISTAL, BIOPSY:  - Barrett mucosa, no dysplasia   PH and impedance testing showing significant reflux Esophageal manometry - mild decrease in distal contractility   I reviewed the operative  report by Dr. Wenda Low in early 2015 when the fundoplication and hernia repair were performed.  Past Medical History:  Diagnosis Date   Anemia 12/14/2012   Ataxia 06/24/2016   Auditory hallucinations 08/19/2010   Has has auditory hallucinations, which seem to have worsened since death of her sister 02/06/10. Admission to [facility] (90s or early 2000)  for 6 weeks after mother died, also with hallucinations.  B/C of severe mental illness and refusal to see pysch, I have refused to refill controlled meds. If she decides to pursue mental health, then she needs to take the initiative and sch the appt bc [name] has s   Barrett's esophagus    Demonstrated on EGD 2011/02/07. EGD 09/17/2012 shows inflamed GE junctional mucosa without metaplasia, dysplasia, or malignancy.   Cataract    Chronic anxiety    Admission to [facility] (90s or early 2000)  for 6 weeks after mother died. Complicated again by death of her sister 2010. 2012 developed hallucinations and I refused to refill controlled meds unless she see psych which she is not agreable to   Chronic insomnia    Depression    Diverticulosis    DVT (deep venous thrombosis) (HCC)    Gallstones    Gastroparesis    Demonstrated on GES 07-Feb-2011 by Dr Dalene Seltzer   GERD (gastroesophageal reflux disease)    describes " I sometimes have a hard time talking or swallowing" - relative to the reflux   Hiatal hernia    HLD (hyperlipidemia) 01/2010   HTN (hypertension)    Controlled with 2 drug therapy   Insomnia 07/27/2006   Left leg DVT (HCC) 09/28/2012   Migraine    Pulmonary nodule 02/10/2014   CT 06/2015 shows stable pulm nodules compared to prior study 2015, so likely benign.      S/P TAVR (transcatheter aortic valve replacement) 05/30/2018   s/p 26 mm Medtronic Evolut Pro Plus via TF approach on 05/30/18   Severe aortic stenosis     Past Surgical History:  Procedure Laterality Date   86 HOUR PH STUDY N/A 08/24/2017   Procedure: 24 HOUR PH STUDY IMPEDANCE;  Surgeon:  Sherrilyn Rist, MD;  Location: [facility] ENDOSCOPY;  Service: Gastroenterology;  Laterality: N/A;   ABDOMINAL HYSTERECTOMY     APPENDECTOMY     BIOPSY  02/10/2023   Procedure: BIOPSY;  Surgeon: Lemar Lofty., MD;  Location: [facility] ENDOSCOPY;  Service: Gastroenterology;;   CARDIAC CATHETERIZATION     CATARACT EXTRACTION     left eye   COLONOSCOPY  2000&2005   Dr.Magod   DIRECT LARYNGOSCOPY   02-07-07   preoperative diagnosis hoarseness with anterior right vocal cord lesion -  direct laryngoscopy and excisional biopsy of right anterior vocal cord lesion done by Dr. Ezzard Standing   ENDOVENOUS ABLATION SAPHENOUS VEIN W/ LASER Left 05-09-2014   EVLA left small saphenous vein by Gretta Began MD   ESOPHAGEAL MANOMETRY N/A 08/24/2017   Procedure: ESOPHAGEAL MANOMETRY (EM);  Surgeon: Sherrilyn Rist, MD;  Location: [facility];  Service: Gastroenterology;  Laterality: N/A;   ESOPHAGOGASTRODUODENOSCOPY  11/2010   ESOPHAGOGASTRODUODENOSCOPY N/A 09/17/2012   Procedure: ESOPHAGOGASTRODUODENOSCOPY (EGD);  Surgeon: Hart Carwin, MD;  Location: [facility] ENDOSCOPY;  Service: Endoscopy;  Laterality: N/A;   ESOPHAGOGASTRODUODENOSCOPY N/A 12/17/2012   Procedure: ESOPHAGOGASTRODUODENOSCOPY (EGD);  Surgeon: Beverley Fiedler, MD;  Location: [facility] ENDOSCOPY;  Service: Gastroenterology;  Laterality: N/A;   ESOPHAGOGASTRODUODENOSCOPY N/A 02/10/2023   Procedure: ESOPHAGOGASTRODUODENOSCOPY (EGD);  Surgeon: Lemar Lofty., MD;  Location: [facility];  Service: Gastroenterology;  Laterality: N/A;   EXCISION MASS HEAD N/A 03/05/2016   Procedure: EXCISION POSTERIOR SCALP MASS;  Surgeon: Axel Filler, MD;  Location: [facility] OR;  Service: General;  Laterality: N/A;   EYE SURGERY Bilateral    cataracts removed-    HEMORRHOID SURGERY     HIATAL HERNIA REPAIR N/A 12/01/2018   Procedure: LAPAROSCOPIC TAKEDOWN HIATAL HERNIA NISEEN REDUCTION AND UPPER ENDOSCOPY;  Surgeon: Luretha Murphy, MD;  Location: [facility] ORS;  Service: General;   Laterality: N/A;   KNEE ARTHROSCOPY     LAPAROSCOPIC NISSEN FUNDOPLICATION N/A 05/02/2013   Procedure: LAPAROSCOPIC NISSEN FUNDOPLICATION;  Surgeon: Valarie Merino, MD;  Location: [facility] ORS;  Service: General;  Laterality: N/A;   MENISCECTOMY   July 2002    preoperative diagnosis torn medial meniscus right knee, partial medial meniscectomy, debridement chondroplasty patellofemoral joint, done by Dr. Madelon Lips   POLYPECTOMY  2000   Dr.Magod   RIGHT/LEFT HEART CATH AND CORONARY ANGIOGRAPHY N/A 04/06/2018   Procedure: RIGHT/LEFT HEART CATH AND CORONARY ANGIOGRAPHY;  Surgeon: Kathleene Hazel, MD;  Location: [facility] INVASIVE CV LAB;  Service: Cardiovascular;  Laterality: N/A;   ROTATOR CUFF REPAIR  09/21/2011   rt shoulder   TEE WITHOUT CARDIOVERSION N/A 05/30/2018   Procedure: TRANSESOPHAGEAL ECHOCARDIOGRAM (TEE);  Surgeon: Kathleene Hazel, MD;  Location: [facility] OR;  Service: Open Heart Surgery;  Laterality: N/A;   TOOTH EXTRACTION Bilateral 04/05/2018   Procedure: Extraction of tooth #'s 6 and 29 with alveoloplasty and gross debridement of remaining teeth;  Surgeon: Charlynne Pander, DDS;  Location: [facility] OR;  Service: Oral Surgery;  Laterality: Bilateral;   TRANSCATHETER AORTIC VALVE REPLACEMENT, TRANSFEMORAL N/A 05/30/2018   Procedure: TRANSCATHETER AORTIC VALVE REPLACEMENT, TRANSFEMORAL - MEDTRONIC VALVE;  Surgeon: Kathleene Hazel, MD;  Location: [facility] OR;  Service: Open Heart Surgery;  Laterality: N/A;    Current Outpatient Medications  Medication Sig Dispense Refill   apixaban (ELIQUIS) 2.5 MG TABS tablet Take 1 tablet (2.5 mg total) by mouth 2 (two) times daily. 60 tablet 6   brimonidine (ALPHAGAN) 0.2 % ophthalmic solution Place 1 drop into the left eye 2 (two) times daily.     cyanocobalamin (VITAMIN B12) 500 MCG tablet Take 1 tablet (500 mcg total) by mouth daily.     dorzolamide-timolol (COSOPT) 22.3-6.8 MG/ML ophthalmic solution Place 1 drop into the left eye 2 (two) times daily.      ferrous sulfate 325 (65 FE) MG tablet Take 1 tablet (325 mg total) by mouth every other day.     gabapentin (NEURONTIN) 100 MG capsule Take 100 mg by mouth 3 (three) times daily.     hydrOXYzine (ATARAX) 25 MG tablet Take 25 mg by mouth 3 (three) times daily.     latanoprost (XALATAN) 0.005 % ophthalmic solution Place 1 drop into both eyes at bedtime.     metoprolol succinate (TOPROL-XL) 25 MG 24 hr tablet Take 25 mg by mouth daily.     pantoprazole (PROTONIX) 40 MG tablet Take 1 tablet (40 mg total) by mouth 2 (two) times daily. 60 tablet 1   QUEtiapine (SEROQUEL) 25 MG tablet Take 25 mg by mouth at bedtime.     sucralfate (CARAFATE) 1  g tablet Take 1 tablet (1 g total) by mouth 4 (four) times daily -  with meals and at bedtime. 120 tablet 1   No current facility-administered medications for this visit.    Allergies as of 06/17/2023 - Review Complete 06/17/2023  Allergen Reaction Noted   Aspirin Itching and Rash 07/06/2006    Family History  Problem Relation Age of Onset   Heart disease Mother    Hypertension Mother    Heart attack Mother    Hypertension Father    Heart attack Father    Diabetes Brother    Stroke Neg Hx    Cancer Neg Hx    Colon cancer Neg Hx    Anesthesia problems Neg Hx    Hypotension Neg Hx    Malignant hyperthermia Neg Hx    Pseudochol deficiency Neg Hx    Stomach cancer Neg Hx    Rectal cancer Neg Hx    Esophageal cancer Neg Hx     Social History   Socioeconomic History   Marital status: Widowed    Spouse name: Not on file   Number of children: 1   Years of education: some college   Highest education level: Not on file  Occupational History   Occupation: Retired-worked in motels  Tobacco Use   Smoking status: Former    Current packs/day: 0.00    Types: Cigarettes    Start date: 06/01/1933    Quit date: 06/02/1983    Years since quitting: 40.0   Smokeless tobacco: Never  Vaping Use   Vaping status: Never Used  Substance and Sexual Activity    Alcohol use: No    Alcohol/week: 0.0 standard drinks of alcohol    Comment: quit 24 years ago   Drug use: No   Sexual activity: Not Currently    Birth control/protection: Post-menopausal, None  Other Topics Concern   Not on file  Social History Narrative   Volunteers at middle school to be a grandmother to the other children in need   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 The patient nolonger drinks alcohol.   The patient is widowed.   Patient has an adopted daughter living in [place].   Education: some college.    Right handed   Social Drivers of Health   Financial Resource Strain: High Risk (09/21/2017)   Overall Financial Resource Strain (CARDIA)    Difficulty of Paying Living Expenses: Hard  Food Insecurity: No Food Insecurity (02/03/2023)   Hunger Vital Sign    Worried About Running Out of Food in the Last Year: Never true    Ran Out of Food in the Last Year: Never true  Transportation Needs: No Transportation Needs (02/03/2023)   PRAPARE - Administrator, Civil Service (Medical): No    Lack of Transportation (Non-Medical): No  Physical Activity: Not on file  Stress: Not on file  Social Connections: Unknown (09/08/2021)   [facility]   Social Network    Social Network: Not on file  Intimate Partner Violence: Not At Risk (02/03/2023)   Humiliation, Afraid, Rape, and Kick questionnaire    Fear of Current or Ex-Partner: No    Emotionally Abused: No    Physically Abused: No  Sexually Abused: No    Review of Systems:    Constitutional: No weight loss, fever, chills, weakness or fatigue HEENT: Eyes: No change in vision               Ears, Nose, Throat:  No change in hearing or congestion Skin: No rash or  itching Cardiovascular: No chest pain, chest pressure or palpitations   Respiratory: No SOB or cough Gastrointestinal: See HPI and otherwise negative Genitourinary: No dysuria or change in urinary frequency Neurological: No headache, dizziness or syncope Musculoskeletal: No new muscle or joint pain Hematologic: No bleeding or bruising Psychiatric: No history of depression or anxiety    Physical Exam:  Vital signs: BP 130/88   Pulse 68   Ht 4' (1.219 m)   Wt 108 lb (49 kg)   LMP 04/26/1950   BMI 32.96 kg/m   Constitutional: NAD, Well developed, Well nourished, alert and cooperative Head:  Normocephalic and atraumatic. Eyes:   PEERL, EOMI. No icterus. Conjunctiva pink. Respiratory: Respirations even and unlabored. Lungs clear to auscultation bilaterally.   No wheezes, crackles, or rhonchi.  Cardiovascular:  Regular rate and rhythm. No peripheral edema, cyanosis or pallor.  Gastrointestinal: Soft, nondistended, normal active bowel sounds.  Nontender when pushing out on abdomen.  Exquisite tenderness when touching xiphoid process/sternum and rib cage.  Negative Carnett's sign Rectal:  Not performed.  Msk:  Symmetrical without gross deformities. Without edema, no deformity or joint abnormality.  Neurologic:  Alert and  oriented x4;  grossly normal neurologically.  Skin:   Dry and intact without significant lesions or rashes. Psychiatric: Oriented to person, place and time. Demonstrates good judgement and reason without abnormal affect or behaviors.    RELEVANT LABS AND IMAGING: CBC    Component Value Date/Time   WBC 2.7 (L) 02/10/2023 0459   RBC 3.69 (L) 02/10/2023 0459   HGB 10.3 (L) 02/10/2023 0459   HGB 11.9 10/26/2018 1529   HCT 33.1 (L) 02/10/2023 0459   HCT 34.4 10/26/2018 1529   PLT 130 (L) 02/10/2023 0459   PLT 171 10/26/2018 1529   MCV 89.7 02/10/2023 0459   MCV 87 10/26/2018 1529   MCH 27.9 02/10/2023 0459   MCHC 31.1 02/10/2023 0459   RDW 13.5 02/10/2023 0459    RDW 13.1 10/26/2018 1529   LYMPHSABS 1.2 02/02/2023 1923   LYMPHSABS 1.3 12/16/2016 1407   MONOABS 0.5 02/02/2023 1923   EOSABS 0.0 02/02/2023 1923   EOSABS 0.0 12/16/2016 1407   BASOSABS 0.0 02/02/2023 1923   BASOSABS 0.0 12/16/2016 1407    CMP     Component Value Date/Time   NA 136 02/10/2023 0459   NA 141 06/08/2018 1513   K 4.1 02/10/2023 0459   CL 100 02/10/2023 0459   CO2 26 02/10/2023 0459   GLUCOSE 92 02/10/2023 0459   BUN 26 (H) 02/10/2023 0459   BUN 17 06/08/2018 1513   CREATININE 0.88 02/10/2023 0459   CREATININE 0.83 04/10/2014 1426   CALCIUM 9.4 02/10/2023 0459   PROT 6.8 02/10/2023 0459   PROT 7.1 12/16/2016 1407   ALBUMIN 3.4 (L) 02/10/2023 0459   ALBUMIN 4.4 12/16/2016 1407   AST 15 02/10/2023 0459   ALT 11 02/10/2023 0459   ALKPHOS 44 02/10/2023 0459   BILITOT 0.5 02/10/2023 0459   BILITOT 0.2 12/16/2016 1407   GFRNONAA >60 02/10/2023 0459   GFRNONAA 67 04/10/2014 1426   GFRAA >60 12/10/2018 0417   GFRAA 78 04/10/2014 1426     Assessment/Plan:  Suspected costochondritis Chronic, intermittent upper abdominal pain that radiates to the left side with physical exam showing exquisite tenderness to xiphoid process/sternum/rib cage. Pain is not associated with eating or bowel movements. Previous GI workup including CT scan and EGD were unremarkable. Pain is localized to the xiphoid process on physical exam.  No improvement in any GI medications.  Some improvement with ibuprofen/Tylenol.  Suspect this chronic pain is not GI related.  More likely costochondritis with description of symptoms and physical exam --Can continue current GI medications (Pantoprazole and Carafate) to control GERD --Recommend regular use of Tylenol and Salonpas patches or Voltaren gel for pain management. -Follow-up in 1 week to assess response to treatment.  Insomnia Reports difficulty sleeping and frequent nocturnal awakenings. -Encourage good sleep hygiene  practices. -Consider further evaluation if symptoms persist with primary care.       [facility] 06/17/2023, 3:46 PM  Cc: Cain Saupe, MD

## 2023-06-17 NOTE — Patient Instructions (Addendum)
 From physical exam and history, likely costochondritis- do lidocaine patches or voltern gel.   Follow up with PCP  _______________________________________________________  If your blood pressure at your visit was 140/90 or greater, please contact your primary care physician to follow up on this.  _______________________________________________________  If you are age 88 or older, your body mass index should be between 23-30. Your Body mass index is 32.96 kg/m. If this is out of the aforementioned range listed, please consider follow up with your Primary Care Provider.  If you are age 9 or younger, your body mass index should be between 19-25. Your Body mass index is 32.96 kg/m. If this is out of the aformentioned range listed, please consider follow up with your Primary Care Provider.   ________________________________________________________  The [place] [facility] providers would like to encourage you to use [facility] to communicate with providers for non-urgent requests or questions.  Due to long hold times on the telephone, sending your provider a message by [facility] may be a faster and more efficient way to get a response.  Please allow 48 business hours for a response.  Please remember that this is for non-urgent requests.  _______________________________________________________  Thank you for trusting me with your gastrointestinal care!   Boone Master, PA

## 2023-06-20 NOTE — Progress Notes (Signed)
 ____________________________________________________________  Attending physician addendum:  Thank you for sending this case to me. I have reviewed the entire note and agree with the plan.   Amada Jupiter, MD  ____________________________________________________________
